# Patient Record
Sex: Female | Born: 1940
Health system: Southern US, Community
[De-identification: ages and names within clinical notes are randomized; demographics above are authoritative.]

## PROBLEM LIST (undated history)

## (undated) DIAGNOSIS — I351 Nonrheumatic aortic (valve) insufficiency: Secondary | ICD-10-CM

## (undated) DIAGNOSIS — I480 Paroxysmal atrial fibrillation: Secondary | ICD-10-CM

## (undated) DIAGNOSIS — J189 Pneumonia, unspecified organism: Secondary | ICD-10-CM

## (undated) DIAGNOSIS — I639 Cerebral infarction, unspecified: Secondary | ICD-10-CM

## (undated) DIAGNOSIS — C50919 Malignant neoplasm of unspecified site of unspecified female breast: Secondary | ICD-10-CM

## (undated) DIAGNOSIS — I519 Heart disease, unspecified: Secondary | ICD-10-CM

## (undated) DIAGNOSIS — D649 Anemia, unspecified: Secondary | ICD-10-CM

## (undated) DIAGNOSIS — F419 Anxiety disorder, unspecified: Secondary | ICD-10-CM

## (undated) DIAGNOSIS — K219 Gastro-esophageal reflux disease without esophagitis: Secondary | ICD-10-CM

## (undated) DIAGNOSIS — I509 Heart failure, unspecified: Secondary | ICD-10-CM

## (undated) DIAGNOSIS — I10 Essential (primary) hypertension: Secondary | ICD-10-CM

## (undated) DIAGNOSIS — H34212 Partial retinal artery occlusion, left eye: Secondary | ICD-10-CM

## (undated) DIAGNOSIS — F32A Depression, unspecified: Secondary | ICD-10-CM

## (undated) DIAGNOSIS — J449 Chronic obstructive pulmonary disease, unspecified: Secondary | ICD-10-CM

## (undated) DIAGNOSIS — F329 Major depressive disorder, single episode, unspecified: Secondary | ICD-10-CM

## (undated) DIAGNOSIS — I2510 Atherosclerotic heart disease of native coronary artery without angina pectoris: Secondary | ICD-10-CM

## (undated) DIAGNOSIS — I428 Other cardiomyopathies: Secondary | ICD-10-CM

## (undated) DIAGNOSIS — R0602 Shortness of breath: Secondary | ICD-10-CM

## (undated) DIAGNOSIS — E785 Hyperlipidemia, unspecified: Secondary | ICD-10-CM

## (undated) DIAGNOSIS — C50419 Malignant neoplasm of upper-outer quadrant of unspecified female breast: Secondary | ICD-10-CM

## (undated) DIAGNOSIS — K862 Cyst of pancreas: Secondary | ICD-10-CM

## (undated) HISTORY — PX: WISDOM TOOTH EXTRACTION: SHX21

## (undated) HISTORY — PX: DILATION AND CURETTAGE OF UTERUS: SHX78

## (undated) HISTORY — DX: Atherosclerotic heart disease of native coronary artery without angina pectoris: I25.10

## (undated) HISTORY — DX: Heart disease, unspecified: I51.9

## (undated) HISTORY — DX: Chronic obstructive pulmonary disease, unspecified: J44.9

## (undated) HISTORY — DX: Partial retinal artery occlusion, left eye: H34.212

## (undated) HISTORY — DX: Malignant neoplasm of unspecified site of unspecified female breast: C50.919

## (undated) HISTORY — DX: Cerebral infarction, unspecified: I63.9

## (undated) HISTORY — DX: Other cardiomyopathies: I42.8

## (undated) HISTORY — DX: Paroxysmal atrial fibrillation: I48.0

## (undated) HISTORY — DX: Heart failure, unspecified: I50.9

## (undated) HISTORY — PX: COLONOSCOPY: SHX174

## (undated) HISTORY — DX: Nonrheumatic aortic (valve) insufficiency: I35.1

## (undated) HISTORY — DX: Hyperlipidemia, unspecified: E78.5

## (undated) HISTORY — DX: Malignant neoplasm of upper-outer quadrant of unspecified female breast: C50.419

---
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
## 1969-02-20 HISTORY — PX: DILATION AND CURETTAGE OF UTERUS: SHX78

## 1972-06-22 HISTORY — PX: TUBAL LIGATION: SHX77

## 1972-06-22 HISTORY — PX: APPENDECTOMY: SHX54

## 1997-11-29 ENCOUNTER — Other Ambulatory Visit: Admission: RE | Admit: 1997-11-29 | Discharge: 1997-11-29 | Payer: Self-pay | Admitting: Obstetrics and Gynecology

## 1998-09-03 ENCOUNTER — Other Ambulatory Visit: Admission: RE | Admit: 1998-09-03 | Discharge: 1998-09-03 | Payer: Self-pay | Admitting: Radiology

## 1998-09-17 ENCOUNTER — Other Ambulatory Visit: Admission: RE | Admit: 1998-09-17 | Discharge: 1998-09-17 | Payer: Self-pay | Admitting: Obstetrics and Gynecology

## 2000-09-28 ENCOUNTER — Encounter: Admission: RE | Admit: 2000-09-28 | Discharge: 2000-09-28 | Payer: Self-pay | Admitting: Obstetrics and Gynecology

## 2000-09-28 ENCOUNTER — Encounter: Payer: Self-pay | Admitting: Obstetrics and Gynecology

## 2001-10-04 ENCOUNTER — Ambulatory Visit (HOSPITAL_COMMUNITY): Admission: RE | Admit: 2001-10-04 | Discharge: 2001-10-04 | Payer: Self-pay | Admitting: Gastroenterology

## 2001-10-04 ENCOUNTER — Encounter (INDEPENDENT_AMBULATORY_CARE_PROVIDER_SITE_OTHER): Payer: Self-pay | Admitting: Specialist

## 2001-12-19 ENCOUNTER — Other Ambulatory Visit: Admission: RE | Admit: 2001-12-19 | Discharge: 2001-12-19 | Payer: Self-pay | Admitting: Obstetrics and Gynecology

## 2002-11-15 ENCOUNTER — Other Ambulatory Visit: Admission: RE | Admit: 2002-11-15 | Discharge: 2002-11-15 | Payer: Self-pay | Admitting: Obstetrics and Gynecology

## 2003-12-12 ENCOUNTER — Other Ambulatory Visit: Admission: RE | Admit: 2003-12-12 | Discharge: 2003-12-12 | Payer: Self-pay | Admitting: Obstetrics and Gynecology

## 2005-08-12 ENCOUNTER — Encounter: Admission: RE | Admit: 2005-08-12 | Discharge: 2005-08-12 | Payer: Self-pay | Admitting: Obstetrics and Gynecology

## 2005-08-24 ENCOUNTER — Other Ambulatory Visit: Admission: RE | Admit: 2005-08-24 | Discharge: 2005-08-24 | Payer: Self-pay | Admitting: Obstetrics and Gynecology

## 2006-11-10 ENCOUNTER — Other Ambulatory Visit: Admission: RE | Admit: 2006-11-10 | Discharge: 2006-11-10 | Payer: Self-pay | Admitting: Obstetrics and Gynecology

## 2010-02-20 DIAGNOSIS — C50919 Malignant neoplasm of unspecified site of unspecified female breast: Secondary | ICD-10-CM

## 2010-02-20 HISTORY — DX: Malignant neoplasm of unspecified site of unspecified female breast: C50.919

## 2010-02-21 ENCOUNTER — Other Ambulatory Visit: Admission: RE | Admit: 2010-02-21 | Discharge: 2010-02-21 | Payer: Self-pay | Admitting: Gynecology

## 2010-02-21 ENCOUNTER — Ambulatory Visit: Payer: Self-pay | Admitting: Gynecology

## 2010-02-27 ENCOUNTER — Emergency Department (HOSPITAL_COMMUNITY): Admission: EM | Admit: 2010-02-27 | Discharge: 2010-02-27 | Payer: Self-pay | Admitting: Family Medicine

## 2010-02-27 DIAGNOSIS — C50411 Malignant neoplasm of upper-outer quadrant of right female breast: Secondary | ICD-10-CM | POA: Insufficient documentation

## 2010-02-27 DIAGNOSIS — C50419 Malignant neoplasm of upper-outer quadrant of unspecified female breast: Secondary | ICD-10-CM

## 2010-02-27 HISTORY — PX: BREAST BIOPSY: SHX20

## 2010-02-27 HISTORY — DX: Malignant neoplasm of upper-outer quadrant of unspecified female breast: C50.419

## 2010-03-04 ENCOUNTER — Ambulatory Visit: Payer: Self-pay | Admitting: Oncology

## 2010-03-05 ENCOUNTER — Emergency Department (HOSPITAL_COMMUNITY): Admission: EM | Admit: 2010-03-05 | Discharge: 2010-03-05 | Payer: Self-pay | Admitting: Family Medicine

## 2010-03-06 LAB — CBC WITH DIFFERENTIAL/PLATELET
Basophils Absolute: 0 10*3/uL (ref 0.0–0.1)
Eosinophils Absolute: 0.2 10*3/uL (ref 0.0–0.5)
HCT: 39.2 % (ref 34.8–46.6)
HGB: 13.4 g/dL (ref 11.6–15.9)
LYMPH%: 30.3 % (ref 14.0–49.7)
MCHC: 34.2 g/dL (ref 31.5–36.0)
MONO#: 0.4 10*3/uL (ref 0.1–0.9)
NEUT%: 58.2 % (ref 38.4–76.8)
Platelets: 286 10*3/uL (ref 145–400)
WBC: 5.4 10*3/uL (ref 3.9–10.3)
lymph#: 1.7 10*3/uL (ref 0.9–3.3)

## 2010-03-06 LAB — COMPREHENSIVE METABOLIC PANEL
AST: 18 U/L (ref 0–37)
Albumin: 4.4 g/dL (ref 3.5–5.2)
BUN: 14 mg/dL (ref 6–23)
CO2: 28 mEq/L (ref 19–32)
Calcium: 10.3 mg/dL (ref 8.4–10.5)
Chloride: 105 mEq/L (ref 96–112)
Creatinine, Ser: 1.28 mg/dL — ABNORMAL HIGH (ref 0.40–1.20)
Glucose, Bld: 108 mg/dL — ABNORMAL HIGH (ref 70–99)
Potassium: 4.6 mEq/L (ref 3.5–5.3)

## 2010-03-06 LAB — CANCER ANTIGEN 27.29: CA 27.29: 30 U/mL (ref 0–39)

## 2010-03-07 ENCOUNTER — Encounter: Admission: RE | Admit: 2010-03-07 | Discharge: 2010-03-07 | Payer: Self-pay

## 2010-03-13 ENCOUNTER — Ambulatory Visit (HOSPITAL_COMMUNITY): Admission: RE | Admit: 2010-03-13 | Discharge: 2010-03-13 | Payer: Self-pay | Admitting: Oncology

## 2010-04-11 ENCOUNTER — Ambulatory Visit: Payer: Self-pay | Admitting: Oncology

## 2010-04-17 ENCOUNTER — Ambulatory Visit: Payer: Self-pay | Admitting: Gynecology

## 2010-04-23 ENCOUNTER — Ambulatory Visit: Payer: Self-pay | Admitting: Obstetrics and Gynecology

## 2010-05-19 ENCOUNTER — Ambulatory Visit: Payer: Self-pay | Admitting: Obstetrics and Gynecology

## 2010-05-28 ENCOUNTER — Ambulatory Visit (HOSPITAL_BASED_OUTPATIENT_CLINIC_OR_DEPARTMENT_OTHER): Payer: Medicare Other | Admitting: Oncology

## 2010-05-30 LAB — CBC WITH DIFFERENTIAL/PLATELET
Basophils Absolute: 0 10*3/uL (ref 0.0–0.1)
EOS%: 4.2 % (ref 0.0–7.0)
Eosinophils Absolute: 0.2 10*3/uL (ref 0.0–0.5)
HGB: 13 g/dL (ref 11.6–15.9)
MCV: 92.9 fL (ref 79.5–101.0)
MONO%: 6.5 % (ref 0.0–14.0)
NEUT#: 3.3 10*3/uL (ref 1.5–6.5)
RBC: 4.01 10*6/uL (ref 3.70–5.45)
RDW: 13 % (ref 11.2–14.5)
lymph#: 1.5 10*3/uL (ref 0.9–3.3)

## 2010-05-30 LAB — COMPREHENSIVE METABOLIC PANEL
AST: 20 U/L (ref 0–37)
Albumin: 4 g/dL (ref 3.5–5.2)
Alkaline Phosphatase: 50 U/L (ref 39–117)
Calcium: 9.9 mg/dL (ref 8.4–10.5)
Chloride: 107 mEq/L (ref 96–112)
Glucose, Bld: 121 mg/dL — ABNORMAL HIGH (ref 70–99)
Potassium: 4 mEq/L (ref 3.5–5.3)
Sodium: 140 mEq/L (ref 135–145)
Total Protein: 6.8 g/dL (ref 6.0–8.3)

## 2010-06-19 LAB — BASIC METABOLIC PANEL
BUN: 13 mg/dL (ref 6–23)
Calcium: 9.6 mg/dL (ref 8.4–10.5)
Glucose, Bld: 122 mg/dL — ABNORMAL HIGH (ref 70–99)

## 2010-07-25 ENCOUNTER — Ambulatory Visit: Payer: No Typology Code available for payment source | Admitting: Oncology

## 2010-07-25 DIAGNOSIS — C50919 Malignant neoplasm of unspecified site of unspecified female breast: Secondary | ICD-10-CM

## 2010-07-25 LAB — CBC WITH DIFFERENTIAL/PLATELET
BASO%: 0.5 % (ref 0.0–2.0)
Eosinophils Absolute: 0.2 10*3/uL (ref 0.0–0.5)
HCT: 40.2 % (ref 34.8–46.6)
LYMPH%: 28.1 % (ref 14.0–49.7)
MCHC: 34.5 g/dL (ref 31.5–36.0)
MCV: 92.9 fL (ref 79.5–101.0)
MONO%: 7.6 % (ref 0.0–14.0)
NEUT%: 59.4 % (ref 38.4–76.8)
Platelets: 226 10*3/uL (ref 145–400)
RBC: 4.33 10*6/uL (ref 3.70–5.45)

## 2010-07-25 LAB — VITAMIN D 25 HYDROXY (VIT D DEFICIENCY, FRACTURES): Vit D, 25-Hydroxy: 38 ng/mL (ref 30–89)

## 2010-07-25 LAB — COMPREHENSIVE METABOLIC PANEL
Alkaline Phosphatase: 51 U/L (ref 39–117)
Creatinine, Ser: 1.06 mg/dL (ref 0.40–1.20)
Glucose, Bld: 133 mg/dL — ABNORMAL HIGH (ref 70–99)
Sodium: 138 mEq/L (ref 135–145)
Total Bilirubin: 0.4 mg/dL (ref 0.3–1.2)
Total Protein: 6.5 g/dL (ref 6.0–8.3)

## 2010-07-25 LAB — PROTIME-INR: Protime: 12 Seconds (ref 10.6–13.4)

## 2010-08-15 ENCOUNTER — Other Ambulatory Visit: Payer: Self-pay | Admitting: Dermatology

## 2010-09-04 LAB — POCT I-STAT, CHEM 8
Calcium, Ion: 1.2 mmol/L (ref 1.12–1.32)
HCT: 42 % (ref 36.0–46.0)
Hemoglobin: 14.3 g/dL (ref 12.0–15.0)
Sodium: 139 mEq/L (ref 135–145)
TCO2: 25 mmol/L (ref 0–100)

## 2010-09-04 LAB — GLUCOSE, CAPILLARY: Glucose-Capillary: 110 mg/dL — ABNORMAL HIGH (ref 70–99)

## 2010-09-10 ENCOUNTER — Encounter (HOSPITAL_BASED_OUTPATIENT_CLINIC_OR_DEPARTMENT_OTHER): Payer: Medicare Other | Admitting: Oncology

## 2010-09-10 ENCOUNTER — Other Ambulatory Visit: Payer: Self-pay | Admitting: Oncology

## 2010-09-10 DIAGNOSIS — C50919 Malignant neoplasm of unspecified site of unspecified female breast: Secondary | ICD-10-CM

## 2010-09-11 LAB — VITAMIN D 25 HYDROXY (VIT D DEFICIENCY, FRACTURES): Vit D, 25-Hydroxy: 52 ng/mL (ref 30–89)

## 2010-11-12 ENCOUNTER — Other Ambulatory Visit: Payer: Self-pay | Admitting: Oncology

## 2010-11-12 ENCOUNTER — Encounter (HOSPITAL_BASED_OUTPATIENT_CLINIC_OR_DEPARTMENT_OTHER): Payer: Medicare Other | Admitting: Oncology

## 2010-11-12 DIAGNOSIS — C50919 Malignant neoplasm of unspecified site of unspecified female breast: Secondary | ICD-10-CM

## 2010-11-12 DIAGNOSIS — M810 Age-related osteoporosis without current pathological fracture: Secondary | ICD-10-CM

## 2010-11-12 DIAGNOSIS — Z170 Estrogen receptor positive status [ER+]: Secondary | ICD-10-CM

## 2010-11-12 LAB — CBC WITH DIFFERENTIAL/PLATELET
BASO%: 0.5 % (ref 0.0–2.0)
EOS%: 3.8 % (ref 0.0–7.0)
MCH: 32.1 pg (ref 25.1–34.0)
MCHC: 34.5 g/dL (ref 31.5–36.0)
NEUT%: 61.2 % (ref 38.4–76.8)
RBC: 4.24 10*6/uL (ref 3.70–5.45)
RDW: 13.3 % (ref 11.2–14.5)
WBC: 4.7 10*3/uL (ref 3.9–10.3)
lymph#: 1.2 10*3/uL (ref 0.9–3.3)

## 2010-11-13 LAB — COMPREHENSIVE METABOLIC PANEL
ALT: 11 U/L (ref 0–35)
AST: 16 U/L (ref 0–37)
Albumin: 4.3 g/dL (ref 3.5–5.2)
Alkaline Phosphatase: 40 U/L (ref 39–117)
BUN: 16 mg/dL (ref 6–23)
Potassium: 4.1 mEq/L (ref 3.5–5.3)

## 2010-11-13 LAB — CANCER ANTIGEN 27.29: CA 27.29: 30 U/mL (ref 0–39)

## 2010-11-27 ENCOUNTER — Encounter (INDEPENDENT_AMBULATORY_CARE_PROVIDER_SITE_OTHER): Payer: Self-pay | Admitting: Surgery

## 2010-12-15 ENCOUNTER — Inpatient Hospital Stay: Admission: RE | Admit: 2010-12-15 | Payer: Medicare Other | Source: Ambulatory Visit

## 2011-01-09 ENCOUNTER — Other Ambulatory Visit: Payer: Self-pay | Admitting: Oncology

## 2011-01-09 ENCOUNTER — Encounter: Payer: Medicare Other | Admitting: Oncology

## 2011-01-09 DIAGNOSIS — C50911 Malignant neoplasm of unspecified site of right female breast: Secondary | ICD-10-CM

## 2011-01-15 ENCOUNTER — Ambulatory Visit
Admission: RE | Admit: 2011-01-15 | Discharge: 2011-01-15 | Disposition: A | Discharge status: Home / Self Care | Payer: Medicare Other | Source: Ambulatory Visit | Attending: Oncology | Admitting: Oncology

## 2011-01-15 DIAGNOSIS — C50911 Malignant neoplasm of unspecified site of right female breast: Secondary | ICD-10-CM

## 2011-01-15 MED ORDER — GADOBENATE DIMEGLUMINE 529 MG/ML IV SOLN
10.0000 mL | Freq: Once | INTRAVENOUS | Status: AC | PRN
  Administered 2011-01-15: 10 mL via INTRAVENOUS

## 2011-01-20 ENCOUNTER — Ambulatory Visit (INDEPENDENT_AMBULATORY_CARE_PROVIDER_SITE_OTHER): Payer: Medicare Other | Admitting: Surgery

## 2011-01-20 ENCOUNTER — Encounter (INDEPENDENT_AMBULATORY_CARE_PROVIDER_SITE_OTHER): Payer: Self-pay | Admitting: Surgery

## 2011-01-20 VITALS — BP 120/82 | HR 75 | Temp 98.8°F | Ht 64.0 in | Wt 121.0 lb

## 2011-01-20 DIAGNOSIS — C50419 Malignant neoplasm of upper-outer quadrant of unspecified female breast: Secondary | ICD-10-CM

## 2011-01-20 NOTE — Patient Instructions (Signed)
I will see you back in December or early January and we will plan to do a guide wire localized excision of your right breast cancer

## 2011-01-20 NOTE — Progress Notes (Signed)
CC: Breast cancer followup was neoadjuvant chemotherapy HPI: This patient was initially seen in September, 2011 with a right breast mass in the upper part of her breast. She had an invasive ductal carcinoma which was a clinical stage II, receptor positive, HER-2 negative. She has been on neoadjuvant for marrow since then. She thinks ear he has gotten somewhat smaller and softer.  On her initial physical examination there was a fairly large 3 cm mass that was hard but no dimpling and not appear fixed deep. I saw her in followup again in January and it seems softer perhaps 2-2.5 cm. I saw her again in April and again the mass appeared to be smaller and perhaps only about 2 cm. She now comes back for followup.  ROS: No significant changes have occurred since her initial ROS recorded in her chart  MEDS: Current Outpatient Prescriptions on File Prior to Visit  Medication Sig Dispense Refill  . Calcium Carbonate-Vitamin D (CALCIUM 500 + D) 500-125 MG-UNIT TABS Take by mouth.        . Cholecalciferol (VITAMIN D-3) 5000 UNITS TABS Take by mouth.        . DiphenhydrAMINE HCl, Sleep, (SIMPLY SLEEP PO) Take 2 tablets by mouth Nightly.        . Letrozole (FEMARA PO) Take 1 tablet by mouth daily.        . Omega-3 Fatty Acids (FISH OIL PO) Take by mouth.        . sertraline (ZOLOFT) 100 MG tablet Take 100 mg by mouth daily.           ALLERGIES:No Known Allergies    PE GENERAL: The patient is alert, oriented, and generally healthy-appearing, NAD. Mood and affect are normal.  HEENT: The head is normocephalic, the eyes nonicteric, the pupils were round regular and equal. EOMs are normal. Pharynx normal. Dentition good.  NECK: The neck is supple and there are no masses or thyromegaly.  LUNGS: Normal respirations and clear to auscultation.  HEART: Regular rhythm, with no murmurs rubs or gallops. Pulses are intact carotid dorsalis pedis and posterior tibial. No significant varicosities are  noted.  BREASTS: The breasts are symmetric in appearance. There is a soft masslike affect in about the 12:00 position of the right breast. It is certainly less discrete than it was on initial exam. There are no other abnormalities of the right or left breast. LYMPHATICS no axillary or supraclavicular adenopathy is apparent today  ABDOMEN: Soft, flat, and nontender. No masses or organomegaly is noted. No hernias are noted. Bowel sounds are normal.  EXTREMITIES: Good range of motion, no edema.   Data Reviewed Her recent MRI report is noted. The area in question has shrunk but still about 3 cm. Ultrasound shows continues shrinkage.  Assessment Stage II right breast cancer 12:00 position status post neoadjuvant chemotherapy Plan I think we can do a lumpectomy adequately, but there may be additional shrinkage if we wait. She would like to have surgery in January, and  I think that will be fine. We will see her in December or early January

## 2011-02-04 ENCOUNTER — Encounter (INDEPENDENT_AMBULATORY_CARE_PROVIDER_SITE_OTHER): Payer: Self-pay | Admitting: Oncology

## 2011-03-19 ENCOUNTER — Other Ambulatory Visit: Payer: Self-pay | Admitting: Oncology

## 2011-03-19 ENCOUNTER — Encounter (HOSPITAL_BASED_OUTPATIENT_CLINIC_OR_DEPARTMENT_OTHER): Payer: Medicare Other | Admitting: Oncology

## 2011-03-19 DIAGNOSIS — M810 Age-related osteoporosis without current pathological fracture: Secondary | ICD-10-CM

## 2011-03-19 DIAGNOSIS — C50919 Malignant neoplasm of unspecified site of unspecified female breast: Secondary | ICD-10-CM

## 2011-03-19 DIAGNOSIS — Z170 Estrogen receptor positive status [ER+]: Secondary | ICD-10-CM

## 2011-03-19 LAB — CBC WITH DIFFERENTIAL/PLATELET
Basophils Absolute: 0 10*3/uL (ref 0.0–0.1)
EOS%: 3.1 % (ref 0.0–7.0)
HCT: 36.5 % (ref 34.8–46.6)
HGB: 13 g/dL (ref 11.6–15.9)
LYMPH%: 28.8 % (ref 14.0–49.7)
MCH: 32.9 pg (ref 25.1–34.0)
MCV: 92.7 fL (ref 79.5–101.0)
MONO%: 7.1 % (ref 0.0–14.0)
NEUT%: 60.6 % (ref 38.4–76.8)

## 2011-03-20 LAB — COMPREHENSIVE METABOLIC PANEL
AST: 20 U/L (ref 0–37)
Alkaline Phosphatase: 43 U/L (ref 39–117)
BUN: 19 mg/dL (ref 6–23)
Calcium: 10.3 mg/dL (ref 8.4–10.5)
Creatinine, Ser: 1.19 mg/dL — ABNORMAL HIGH (ref 0.50–1.10)

## 2011-05-20 ENCOUNTER — Ambulatory Visit (HOSPITAL_BASED_OUTPATIENT_CLINIC_OR_DEPARTMENT_OTHER): Payer: Medicare Other | Admitting: Oncology

## 2011-05-20 ENCOUNTER — Telehealth: Payer: Self-pay | Admitting: Oncology

## 2011-05-20 VITALS — BP 116/75 | HR 73 | Temp 97.9°F | Ht 63.5 in | Wt 123.5 lb

## 2011-05-20 DIAGNOSIS — C50919 Malignant neoplasm of unspecified site of unspecified female breast: Secondary | ICD-10-CM

## 2011-05-20 DIAGNOSIS — C50419 Malignant neoplasm of upper-outer quadrant of unspecified female breast: Secondary | ICD-10-CM

## 2011-05-20 DIAGNOSIS — Z170 Estrogen receptor positive status [ER+]: Secondary | ICD-10-CM

## 2011-05-20 NOTE — Telephone Encounter (Signed)
called pt lmovm for appt on 05/26/2011 and 08/20/2011.  asked pt to rtn call to confirm appt

## 2011-05-20 NOTE — Progress Notes (Signed)
Hematology and Oncology Follow Up Visit  Dana Pruitt [id number] Jul 23, 1940 70 y.o. 05/20/2011 6:35 PM   Principle Diagnosis: 70 year old woman with history of locally advanced ER PR positive breast cancer on neoadjuvant Femara therapy dating back to September 2011  Interim History:  She is doing well she tolerates Femara well. The plan is for her to undergo surgery in January. He currently had a percent of her right breast , results of which we do not have. Clinically it would appear that she has responded well to treatment.  Medications: I have reviewed the patient's current medications.  Allergies: No Known Allergies  Past Medical History, Surgical history, Social history, and Family History were reviewed and updated.  Review of Systems: Constitutional:  Negative for fever, chills, night sweats, anorexia, weight loss, pain. Cardiovascular: no chest pain or dyspnea on exertion Respiratory: no cough, shortness of breath, or wheezing Neurological: no TIA or stroke symptoms Dermatological: negative ENT: negative Skin Gastrointestinal: no abdominal pain, change in bowel habits, or black or bloody stools Genito-Urinary: no dysuria, trouble voiding, or hematuria Hematological and Lymphatic: negative Breast: negative for breast lumps Musculoskeletal: negative Remaining ROS negative.  Physical Exam: Blood pressure 116/75, pulse 73, temperature 97.9 F (36.6 C), height 5' 3.5" (1.613 m), weight 123 lb 8 oz (56.019 kg). ECOG:  General appearance: alert, cooperative and appears stated age Head: Normocephalic, without obvious abnormality, atraumatic Neck: no adenopathy, no carotid bruit, no JVD, supple, symmetrical, trachea midline and thyroid not enlarged, symmetric, no tenderness/mass/nodules Lymph nodes: Cervical, supraclavicular, and axillary nodes normal. Cardiac : Normal heart sounds Pulmonary: Normal breath sounds Breasts: Fibrocystic changes in both breasts without discrete  mass in the right breast. Abdomen: Nontender, no organomegaly or masses Extremities normal no clubbing or cyanosis Neuro: Normal  Lab Results: Lab Results  Component Value Date   WBC 4.9 03/19/2011   HGB 13.0 03/19/2011   HCT 36.5 03/19/2011   MCV 92.7 03/19/2011   PLT 244 03/19/2011     Chemistry      Component Value Date/Time   NA 140 03/19/2011 1359   NA 140 03/19/2011 1359   K 4.1 03/19/2011 1359   K 4.1 03/19/2011 1359   CL 104 03/19/2011 1359   CL 104 03/19/2011 1359   CO2 25 03/19/2011 1359   CO2 25 03/19/2011 1359   BUN 19 03/19/2011 1359   BUN 19 03/19/2011 1359   CREATININE 1.19* 03/19/2011 1359   CREATININE 1.19* 03/19/2011 1359      Component Value Date/Time   CALCIUM 10.3 03/19/2011 1359   CALCIUM 10.3 03/19/2011 1359   ALKPHOS 43 03/19/2011 1359   ALKPHOS 43 03/19/2011 1359   AST 20 03/19/2011 1359   AST 20 03/19/2011 1359   ALT 15 03/19/2011 1359   ALT 15 03/19/2011 1359   BILITOT 0.5 03/19/2011 1359   BILITOT 0.5 03/19/2011 1359       Radiological Studies: chest X-ray n/a Mammogram n/a Bone density n/a  Impression and Plan: Pleasant postmenopausal women on neoadjuvant hormonal therapy for a year now due to undergo surgery. This will likely be a lumpectomy. I will see her again in followup after she has undergone radiation. She should stay on the Femara throughout her course until she is seen again.  More than 50% of the visit was spent in patient-related counselling   [name], MD 11/28/20126:35 PM

## 2011-05-22 ENCOUNTER — Encounter (INDEPENDENT_AMBULATORY_CARE_PROVIDER_SITE_OTHER): Payer: Self-pay | Admitting: Surgery

## 2011-05-22 ENCOUNTER — Ambulatory Visit (INDEPENDENT_AMBULATORY_CARE_PROVIDER_SITE_OTHER): Payer: Medicare Other | Admitting: Surgery

## 2011-05-22 ENCOUNTER — Other Ambulatory Visit: Payer: Self-pay | Admitting: Nurse Practitioner

## 2011-05-22 VITALS — BP 130/84 | HR 68 | Temp 97.8°F | Resp 16 | Ht 64.0 in | Wt 123.0 lb

## 2011-05-22 DIAGNOSIS — C50419 Malignant neoplasm of upper-outer quadrant of unspecified female breast: Secondary | ICD-10-CM

## 2011-05-22 NOTE — Progress Notes (Signed)
CC: Breast cancer followup was neoadjuvant chemotherapy HPI: This patient was initially seen in September, 2011 with a right breast mass in the upper part of her breast. She had an invasive ductal carcinoma which was a clinical stage II, receptor positive, HER-2 negative. She has been on neoadjuvant for marrow since then. She thinks ear he has gotten somewhat smaller and softer.  On her initial physical examination there was a fairly large 3 cm mass that was hard but no dimpling and not appear fixed deep. I saw her in followup again in January and it seems softer perhaps 2-2.5 cm. I saw her again in April and again the mass appeared to be smaller and perhaps only about 2 cm. She now comes back for followup.  ROS: No significant changes have occurred since her initial ROS recorded in her chart  MEDS: Current Outpatient Prescriptions on File Prior to Visit  Medication Sig Dispense Refill  . acyclovir (ZOVIRAX) 200 MG capsule Ad lib.      . Calcium Carbonate-Vitamin D (CALCIUM 500 + D) 500-125 MG-UNIT TABS Take by mouth.        . Cholecalciferol (VITAMIN D-3) 5000 UNITS TABS Take by mouth.        . DiphenhydrAMINE HCl, Sleep, (SIMPLY SLEEP PO) Take 2 tablets by mouth Nightly.       . Letrozole (FEMARA PO) Take 1 tablet by mouth daily.        . Omega-3 Fatty Acids (FISH OIL PO) Take by mouth.        . sertraline (ZOLOFT) 100 MG tablet Take 100 mg by mouth daily.           ALLERGIES:No Known Allergies    PE GENERAL: The patient is alert, oriented, and generally healthy-appearing, NAD. Mood and affect are normal.    BREASTS: The breasts are symmetric in appearance. There is a soft masslike affect in about the 12:00 position of the right breast. It is certainly less discrete than it was on initial exam. There are no other abnormalities of the right or left breast. LYMPHATICS no axillary or supraclavicular adenopathy is apparent today    Data Reviewed Ultrasoound report  pending  Assessment Stage II right breast cancer 12:00 position status post neoadjuvant hormonal therapy Plan I think we can do a lumpectomy adequately, but there may be additional shrinkage if we wait. She would like to have surgery in January, and  I think that will be fine. I have discussed the indications for the lumpectomy and described the procedure. She understand that the chance of removal of the abnormal area is very good, but that occasionally we are unable to locate it and may have to do a second procedure. We also discussed the possibility of a second procedure to get additional tissue. Risks of surgery such as bleeding and infection have also been explained, as well as the implications of not doing the surgery. She understands and wishes to proceed. i will see her one more time before surgery but will try to get the surgery arranged today

## 2011-05-22 NOTE — Patient Instructions (Signed)
We will schedule you for surgery in early January. I will need to see you a week or two before surgery to review everything again

## 2011-05-26 ENCOUNTER — Ambulatory Visit (HOSPITAL_BASED_OUTPATIENT_CLINIC_OR_DEPARTMENT_OTHER): Payer: Medicare Other

## 2011-05-26 DIAGNOSIS — M810 Age-related osteoporosis without current pathological fracture: Secondary | ICD-10-CM

## 2011-05-26 DIAGNOSIS — C50919 Malignant neoplasm of unspecified site of unspecified female breast: Secondary | ICD-10-CM

## 2011-05-26 DIAGNOSIS — C50419 Malignant neoplasm of upper-outer quadrant of unspecified female breast: Secondary | ICD-10-CM

## 2011-05-26 MED ORDER — DENOSUMAB 60 MG/ML ~~LOC~~ SOLN
60.0000 mg | Freq: Once | SUBCUTANEOUS | Status: AC
  Administered 2011-05-26: 60 mg via SUBCUTANEOUS
  Filled 2011-05-26: qty 1

## 2011-05-28 ENCOUNTER — Telehealth: Payer: Self-pay | Admitting: *Deleted

## 2011-05-28 NOTE — Telephone Encounter (Signed)
Dana Pruitt from [facility] is calling.  Pt. Is there for Bone Density, however there is no order.  Reviewed Dr. Renelda Loma last 2 notes and no mention is made. Pt's last one was at [facility] (Dr. Eda Paschal)  On 04/17/10.  Pt. Has also had one at [facility], but it has been several years ago.    When and Where does Dr. Donnie Coffin want next bone density?  Please let pt. Know  [phone] and schedule.   Let them know that Dr. Donnie Coffin is out of the office until Monday 06/01/11.    Will review with him then.  Let pt. Know to call us on Tuesday 06/02/11 if she has not heard from [place].

## 2011-05-29 ENCOUNTER — Other Ambulatory Visit (INDEPENDENT_AMBULATORY_CARE_PROVIDER_SITE_OTHER): Payer: Self-pay | Admitting: Surgery

## 2011-05-29 DIAGNOSIS — C50911 Malignant neoplasm of unspecified site of right female breast: Secondary | ICD-10-CM

## 2011-06-30 ENCOUNTER — Ambulatory Visit (INDEPENDENT_AMBULATORY_CARE_PROVIDER_SITE_OTHER): Payer: Medicare Other | Admitting: Surgery

## 2011-06-30 ENCOUNTER — Encounter (INDEPENDENT_AMBULATORY_CARE_PROVIDER_SITE_OTHER): Payer: Self-pay | Admitting: Surgery

## 2011-06-30 VITALS — BP 146/88 | HR 70 | Temp 97.9°F | Resp 16 | Ht 64.0 in | Wt 121.2 lb

## 2011-06-30 DIAGNOSIS — C50419 Malignant neoplasm of upper-outer quadrant of unspecified female breast: Secondary | ICD-10-CM

## 2011-06-30 NOTE — Progress Notes (Signed)
See H&P note from today

## 2011-06-30 NOTE — H&P (Signed)
  CC:  Breast cancer followup was neoadjuvant chemotherapy  HPI:  This patient was initially seen in September, 2011 with a right breast mass in the upper part of her breast. She had an invasive ductal carcinoma which was a clinical stage II, receptor positive, HER-2 negative. She has been on neoadjuvant hormonal txsince then. She thinks the area has gotten somewhat smaller and softer.  On her initial physical examination there was a fairly large 3 cm mass that was hard but no dimpling and not appear fixed deep. I saw her in followup again in January and it seems softer perhaps 2-2.5 cm. I saw her again in April and again the mass appeared to be smaller and perhaps only about 2 cm. In November it was not definitel palpable. ROS:  No significant changes have occurred since her initial ROS recorded in her chart  MEDS:  Current Outpatient Prescriptions on File Prior to Visit   Medication  Sig  Dispense  Refill   .  acyclovir (ZOVIRAX) 200 MG capsule  Ad lib.     .  Calcium Carbonate-Vitamin D (CALCIUM 500 + D) 500-125 MG-UNIT TABS  Take by mouth.     .  Cholecalciferol (VITAMIN D-3) 5000 UNITS TABS  Take by mouth.     .  DiphenhydrAMINE HCl, Sleep, (SIMPLY SLEEP PO)  Take 2 tablets by mouth Nightly.     .  Letrozole (FEMARA PO)  Take 1 tablet by mouth daily.     .  Omega-3 Fatty Acids (FISH OIL PO)  Take by mouth.     .  sertraline (ZOLOFT) 100 MG tablet  Take 100 mg by mouth daily.     ALLERGIES:No Known Allergies  PE  GENERAL: The patient is alert, oriented, and generally healthy-appearing, NAD. Mood and affect are normal.  HEENT: Normal CHEST: Clear, normal respirations HEART; Reg, no M.R,of G. BREASTS: The breasts are symmetric in appearance. There is no definite mass today There are no other abnormalities of the right or left breast.  LYMPHATICS no axillary or supraclavicular adenopathy is apparent today  Data Reviewed  Ultrasound: mass now more retracted looking and 1.9 cm  max Assessment  Stage II right breast cancer 12:00 position status post neoadjuvant hormonal therapy  Plan: NL Lumpectomy and SLN.\ I have discussed the indications for the lumpectomy and described the procedure. She understand that the chance of removal of the abnormal area is very good, but that occasionally we are unable to locate it and may have to do a second procedure. We also discussed the possibility of a second procedure to get additional tissue. Risks of surgery such as bleeding and infection have also been explained, as well as the implications of not doing the surgery. She understands and wishes to proceed. She is scheduled for Friday   

## 2011-07-01 ENCOUNTER — Encounter (HOSPITAL_BASED_OUTPATIENT_CLINIC_OR_DEPARTMENT_OTHER): Payer: Self-pay | Admitting: *Deleted

## 2011-07-01 NOTE — Progress Notes (Signed)
No labs needed

## 2011-07-02 ENCOUNTER — Encounter (HOSPITAL_BASED_OUTPATIENT_CLINIC_OR_DEPARTMENT_OTHER): Payer: Self-pay | Admitting: *Deleted

## 2011-07-03 ENCOUNTER — Ambulatory Visit (HOSPITAL_COMMUNITY)
Admission: RE | Admit: 2011-07-03 | Discharge: 2011-07-03 | Disposition: A | Discharge status: Home / Self Care | Payer: Medicare Other | Source: Ambulatory Visit | Attending: Surgery | Admitting: Surgery

## 2011-07-03 ENCOUNTER — Ambulatory Visit (HOSPITAL_BASED_OUTPATIENT_CLINIC_OR_DEPARTMENT_OTHER)
Admission: RE | Admit: 2011-07-03 | Discharge: 2011-07-03 | Disposition: A | Discharge status: Home / Self Care | Payer: Medicare Other | Source: Ambulatory Visit | Attending: Surgery | Admitting: Surgery

## 2011-07-03 ENCOUNTER — Encounter (HOSPITAL_BASED_OUTPATIENT_CLINIC_OR_DEPARTMENT_OTHER)
Admission: RE | Disposition: A | Discharge status: Home / Self Care | Payer: Self-pay | Source: Ambulatory Visit | Attending: Surgery

## 2011-07-03 ENCOUNTER — Other Ambulatory Visit (INDEPENDENT_AMBULATORY_CARE_PROVIDER_SITE_OTHER): Payer: Self-pay | Admitting: Surgery

## 2011-07-03 ENCOUNTER — Encounter (HOSPITAL_BASED_OUTPATIENT_CLINIC_OR_DEPARTMENT_OTHER): Payer: Self-pay | Admitting: *Deleted

## 2011-07-03 ENCOUNTER — Encounter (HOSPITAL_BASED_OUTPATIENT_CLINIC_OR_DEPARTMENT_OTHER): Payer: Self-pay | Admitting: Anesthesiology

## 2011-07-03 ENCOUNTER — Ambulatory Visit (HOSPITAL_BASED_OUTPATIENT_CLINIC_OR_DEPARTMENT_OTHER): Payer: Medicare Other | Admitting: Anesthesiology

## 2011-07-03 DIAGNOSIS — D0590 Unspecified type of carcinoma in situ of unspecified breast: Secondary | ICD-10-CM | POA: Insufficient documentation

## 2011-07-03 DIAGNOSIS — C50911 Malignant neoplasm of unspecified site of right female breast: Secondary | ICD-10-CM

## 2011-07-03 DIAGNOSIS — C50419 Malignant neoplasm of upper-outer quadrant of unspecified female breast: Secondary | ICD-10-CM | POA: Insufficient documentation

## 2011-07-03 HISTORY — PX: BREAST LUMPECTOMY WITH SENTINEL LYMPH NODE BIOPSY: SHX5597

## 2011-07-03 HISTORY — DX: Depression, unspecified: F32.A

## 2011-07-03 SURGERY — BREAST LUMPECTOMY WITH SENTINEL LYMPH NODE BX
Anesthesia: General | Site: Breast | Laterality: Right | Wound class: Clean

## 2011-07-03 MED ORDER — FENTANYL CITRATE 0.05 MG/ML IJ SOLN
50.0000 ug | INTRAMUSCULAR | Status: DC | PRN
  Administered 2011-07-03: 50 ug via INTRAVENOUS

## 2011-07-03 MED ORDER — MIDAZOLAM HCL 2 MG/2ML IJ SOLN
0.5000 mg | INTRAMUSCULAR | Status: DC | PRN
  Administered 2011-07-03: 1 mg via INTRAVENOUS

## 2011-07-03 MED ORDER — BUPIVACAINE HCL (PF) 0.25 % IJ SOLN
INTRAMUSCULAR | Status: DC | PRN
  Administered 2011-07-03: 30 mL

## 2011-07-03 MED ORDER — LACTATED RINGERS IV SOLN
INTRAVENOUS | Status: DC
Start: 2011-07-03 — End: 2011-07-03
  Administered 2011-07-03 (×2): via INTRAVENOUS

## 2011-07-03 MED ORDER — MORPHINE SULFATE 2 MG/ML IJ SOLN
0.0500 mg/kg | INTRAMUSCULAR | Status: DC | PRN
  Administered 2011-07-03 (×2): 1 mg via INTRAVENOUS

## 2011-07-03 MED ORDER — FENTANYL CITRATE 0.05 MG/ML IJ SOLN
INTRAMUSCULAR | Status: DC | PRN
  Administered 2011-07-03 (×2): 50 ug via INTRAVENOUS

## 2011-07-03 MED ORDER — LIDOCAINE HCL (CARDIAC) 20 MG/ML IV SOLN
INTRAVENOUS | Status: DC | PRN
  Administered 2011-07-03: 50 mg via INTRAVENOUS

## 2011-07-03 MED ORDER — OXYCODONE HCL 5 MG PO TABS
5.0000 mg | ORAL_TABLET | Freq: Four times a day (QID) | ORAL | Status: DC | PRN
  Administered 2011-07-03: 5 mg via ORAL

## 2011-07-03 MED ORDER — PROPOFOL 10 MG/ML IV EMUL
INTRAVENOUS | Status: DC | PRN
  Administered 2011-07-03: 200 mg via INTRAVENOUS

## 2011-07-03 MED ORDER — SODIUM CHLORIDE 0.9 % IJ SOLN
INTRAMUSCULAR | Status: DC | PRN
  Administered 2011-07-03: 14:00:00 via INTRAMUSCULAR

## 2011-07-03 MED ORDER — DEXAMETHASONE SODIUM PHOSPHATE 4 MG/ML IJ SOLN
INTRAMUSCULAR | Status: DC | PRN
  Administered 2011-07-03: 10 mg via INTRAVENOUS

## 2011-07-03 MED ORDER — METOCLOPRAMIDE HCL 5 MG/ML IJ SOLN: 10.0000 mg | Freq: Once | INTRAMUSCULAR | Status: DC | PRN

## 2011-07-03 MED ORDER — TECHNETIUM TC 99M SULFUR COLLOID FILTERED
1.0000 | Freq: Once | INTRAVENOUS | Status: AC | PRN
  Administered 2011-07-03: 1 via INTRADERMAL

## 2011-07-03 MED ORDER — MIDAZOLAM HCL 5 MG/5ML IJ SOLN
INTRAMUSCULAR | Status: DC | PRN
  Administered 2011-07-03: 1 mg via INTRAVENOUS

## 2011-07-03 MED ORDER — ONDANSETRON HCL 4 MG/2ML IJ SOLN
INTRAMUSCULAR | Status: DC | PRN
  Administered 2011-07-03: 4 mg via INTRAVENOUS

## 2011-07-03 MED ORDER — ACETAMINOPHEN 10 MG/ML IV SOLN
1000.0000 mg | Freq: Once | INTRAVENOUS | Status: AC
  Administered 2011-07-03: 1000 mg via INTRAVENOUS

## 2011-07-03 MED ORDER — METOCLOPRAMIDE HCL 5 MG/ML IJ SOLN
INTRAMUSCULAR | Status: DC | PRN
  Administered 2011-07-03: 10 mg via INTRAVENOUS

## 2011-07-03 MED ORDER — CEFAZOLIN SODIUM 1-5 GM-% IV SOLN
1.0000 g | INTRAVENOUS | Status: AC
  Administered 2011-07-03: 1 g via INTRAVENOUS

## 2011-07-03 MED ORDER — FENTANYL CITRATE 0.05 MG/ML IJ SOLN
25.0000 ug | INTRAMUSCULAR | Status: DC | PRN
  Administered 2011-07-03: 50 ug via INTRAVENOUS
  Administered 2011-07-03 (×2): 25 ug via INTRAVENOUS

## 2011-07-03 MED ORDER — OXYCODONE HCL 5 MG PO TABS: 5.0000 mg | ORAL_TABLET | Freq: Four times a day (QID) | ORAL | Status: AC | PRN

## 2011-07-03 SURGICAL SUPPLY — 62 items
ADH SKN CLS APL DERMABOND .7 (GAUZE/BANDAGES/DRESSINGS) ×1
APPLIER CLIP 11 MED OPEN (CLIP)
APPLIER CLIP 9.375 MED OPEN (MISCELLANEOUS)
APR CLP MED 11 20 MLT OPN (CLIP)
APR CLP MED 9.3 20 MLT OPN (MISCELLANEOUS)
BLADE HEX COATED 2.75 (ELECTRODE) ×2 IMPLANT
BLADE SURG 15 STRL LF DISP TIS (BLADE) ×2 IMPLANT
BLADE SURG 15 STRL SS (BLADE) ×4
CANISTER SUCTION 1200CC (MISCELLANEOUS) ×2 IMPLANT
CHLORAPREP W/TINT 26ML (MISCELLANEOUS) ×2 IMPLANT
CLIP APPLIE 11 MED OPEN (CLIP) IMPLANT
CLIP APPLIE 9.375 MED OPEN (MISCELLANEOUS) IMPLANT
CLIP TI MEDIUM 6 (CLIP) IMPLANT
CLIP TI WIDE RED SMALL 6 (CLIP) ×2 IMPLANT
CLOTH BEACON ORANGE TIMEOUT ST (SAFETY) ×2 IMPLANT
COVER KIT LFREE 14X147CM (MISCELLANEOUS) IMPLANT
COVER MAYO STAND STRL (DRAPES) ×2 IMPLANT
COVER PROBE W GEL 5X96 (DRAPES) ×2 IMPLANT
COVER TABLE BACK 60X90 (DRAPES) ×2 IMPLANT
DECANTER SPIKE VIAL GLASS SM (MISCELLANEOUS) IMPLANT
DERMABOND ADVANCED (GAUZE/BANDAGES/DRESSINGS) ×1
DERMABOND ADVANCED .7 DNX12 (GAUZE/BANDAGES/DRESSINGS) ×2 IMPLANT
DEVICE DUBIN W/COMP PLATE 8390 (MISCELLANEOUS) ×1 IMPLANT
DRAIN CHANNEL 19F RND (DRAIN) IMPLANT
DRAPE LAPAROSCOPIC ABDOMINAL (DRAPES) ×2 IMPLANT
DRAPE UTILITY XL STRL (DRAPES) ×2 IMPLANT
ELECT REM PT RETURN 9FT ADLT (ELECTROSURGICAL) ×2
ELECTRODE REM PT RTRN 9FT ADLT (ELECTROSURGICAL) ×1 IMPLANT
EVACUATOR SILICONE 100CC (DRAIN) IMPLANT
GLOVE BIO SURGEON STRL SZ 6.5 (GLOVE) ×1 IMPLANT
GLOVE BIOGEL PI IND STRL 7.0 (GLOVE) IMPLANT
GLOVE BIOGEL PI INDICATOR 7.0 (GLOVE) ×1
GLOVE EUDERMIC 7 POWDERFREE (GLOVE) ×2 IMPLANT
GLOVE SKINSENSE NS SZ7.0 (GLOVE) ×1
GLOVE SKINSENSE STRL SZ7.0 (GLOVE) IMPLANT
GOWN PREVENTION PLUS XLARGE (GOWN DISPOSABLE) ×5 IMPLANT
KIT MARKER MARGIN INK (KITS) ×2 IMPLANT
NDL HYPO 25X1 1.5 SAFETY (NEEDLE) ×2 IMPLANT
NDL SAFETY ECLIPSE 18X1.5 (NEEDLE) ×1 IMPLANT
NEEDLE HYPO 18GX1.5 SHARP (NEEDLE) ×2
NEEDLE HYPO 25X1 1.5 SAFETY (NEEDLE) ×4 IMPLANT
NS IRRIG 1000ML POUR BTL (IV SOLUTION) ×2 IMPLANT
PACK BASIN DAY SURGERY FS (CUSTOM PROCEDURE TRAY) ×2 IMPLANT
PENCIL BUTTON HOLSTER BLD 10FT (ELECTRODE) ×2 IMPLANT
PIN SAFETY STERILE (MISCELLANEOUS) IMPLANT
SLEEVE SCD COMPRESS KNEE MED (MISCELLANEOUS) ×2 IMPLANT
SPONGE GAUZE 4X4 12PLY (GAUZE/BANDAGES/DRESSINGS) IMPLANT
SPONGE INTESTINAL PEANUT (DISPOSABLE) IMPLANT
SPONGE LAP 18X18 X RAY DECT (DISPOSABLE) IMPLANT
SPONGE LAP 4X18 X RAY DECT (DISPOSABLE) ×2 IMPLANT
STAPLER VISISTAT 35W (STAPLE) ×1 IMPLANT
SUT ETHILON 2 0 FS 18 (SUTURE) IMPLANT
SUT ETHILON 3 0 FSL (SUTURE) IMPLANT
SUT MNCRL AB 4-0 PS2 18 (SUTURE) ×4 IMPLANT
SUT VIC AB 4-0 BRD 54 (SUTURE) IMPLANT
SUT VICRYL 3-0 CR8 SH (SUTURE) ×4 IMPLANT
SYR CONTROL 10ML LL (SYRINGE) ×4 IMPLANT
TOWEL OR 17X24 6PK STRL BLUE (TOWEL DISPOSABLE) ×2 IMPLANT
TOWEL OR NON WOVEN STRL DISP B (DISPOSABLE) ×2 IMPLANT
TUBE CONNECTING 20X1/4 (TUBING) ×2 IMPLANT
WATER STERILE IRR 1000ML POUR (IV SOLUTION) ×1 IMPLANT
YANKAUER SUCT BULB TIP NO VENT (SUCTIONS) ×2 IMPLANT

## 2011-07-03 NOTE — Anesthesia Preprocedure Evaluation (Signed)
Anesthesia Evaluation  Patient identified by MRN, date of birth, ID band Patient awake    Reviewed: Allergy & Precautions, H&P , NPO status , Patient's Chart, lab work & pertinent test results, reviewed documented beta blocker date and time   Airway Mallampati: II TM Distance: >3 FB Neck ROM: full    Dental   Pulmonary neg pulmonary ROS,          Cardiovascular neg cardio ROS     Neuro/Psych PSYCHIATRIC DISORDERS Negative Neurological ROS     GI/Hepatic negative GI ROS, Neg liver ROS,   Endo/Other  Negative Endocrine ROS  Renal/GU negative Renal ROS  Genitourinary negative   Musculoskeletal   Abdominal   Peds  Hematology negative hematology ROS (+)   Anesthesia Other Findings See surgeon's H&P   Reproductive/Obstetrics negative OB ROS                           Anesthesia Physical Anesthesia Plan  ASA: II  Anesthesia Plan: General   Post-op Pain Management:    Induction: Intravenous  Airway Management Planned: LMA  Additional Equipment:   Intra-op Plan:   Post-operative Plan: Extubation in OR  Informed Consent: I have reviewed the patients History and Physical, chart, labs and discussed the procedure including the risks, benefits and alternatives for the proposed anesthesia with the patient or authorized representative who has indicated his/her understanding and acceptance.     Plan Discussed with: CRNA and Surgeon  Anesthesia Plan Comments:         Anesthesia Quick Evaluation  

## 2011-07-03 NOTE — Interval H&P Note (Signed)
History and Physical Interval Note:  07/03/2011 1:39 PM  Dana Pruitt  has presented today for surgery, with the diagnosis of right breast cancer  The various methods of treatment have been discussed with the patient and family. After consideration of risks, benefits and other options for treatment, the patient has consented to  Procedure(s): BREAST LUMPECTOMY WITH SENTINEL LYMPH NODE BX as a surgical intervention .  The patients' history has been reviewed, patient examined, no change in status, stable for surgery.  I have reviewed the patients' chart and labs.  Questions were answered to the patient's satisfaction.     Gizel Riedlinger J

## 2011-07-03 NOTE — Progress Notes (Signed)
Emotional support to patient during breast injections 

## 2011-07-03 NOTE — Anesthesia Procedure Notes (Signed)
Procedure Name: LMA Insertion Date/Time: 07/03/2011 1:59 PM Performed by: Zenia Resides D Pre-anesthesia Checklist: Patient identified, Emergency Drugs available, Suction available and Patient being monitored Patient Re-evaluated:Patient Re-evaluated prior to inductionOxygen Delivery Method: Circle System Utilized Preoxygenation: Pre-oxygenation with 100% oxygen Intubation Type: IV induction Ventilation: Mask ventilation without difficulty LMA: LMA inserted LMA Size: 3.0 Number of attempts: 1 Airway Equipment and Method: bite block Placement Confirmation: positive ETCO2 and breath sounds checked- equal and bilateral Tube secured with: Tape Dental Injury: Teeth and Oropharynx as per pre-operative assessment

## 2011-07-03 NOTE — H&P (View-Only) (Signed)
  CC:  Breast cancer followup was neoadjuvant chemotherapy  HPI:  This patient was initially seen in September, 2011 with a right breast mass in the upper part of her breast. She had an invasive ductal carcinoma which was a clinical stage II, receptor positive, HER-2 negative. She has been on neoadjuvant hormonal txsince then. She thinks the area has gotten somewhat smaller and softer.  On her initial physical examination there was a fairly large 3 cm mass that was hard but no dimpling and not appear fixed deep. I saw her in followup again in January and it seems softer perhaps 2-2.5 cm. I saw her again in April and again the mass appeared to be smaller and perhaps only about 2 cm. In November it was not definitel palpable. ROS:  No significant changes have occurred since her initial ROS recorded in her chart  MEDS:  Current Outpatient Prescriptions on File Prior to Visit   Medication  Sig  Dispense  Refill   .  acyclovir (ZOVIRAX) 200 MG capsule  Ad lib.     .  Calcium Carbonate-Vitamin D (CALCIUM 500 + D) 500-125 MG-UNIT TABS  Take by mouth.     .  Cholecalciferol (VITAMIN D-3) 5000 UNITS TABS  Take by mouth.     .  DiphenhydrAMINE HCl, Sleep, (SIMPLY SLEEP PO)  Take 2 tablets by mouth Nightly.     .  Letrozole (FEMARA PO)  Take 1 tablet by mouth daily.     .  Omega-3 Fatty Acids (FISH OIL PO)  Take by mouth.     .  sertraline (ZOLOFT) 100 MG tablet  Take 100 mg by mouth daily.     ALLERGIES:No Known Allergies  PE  GENERAL: The patient is alert, oriented, and generally healthy-appearing, NAD. Mood and affect are normal.  HEENT: Normal CHEST: Clear, normal respirations HEART; Reg, no M.R,of G. BREASTS: The breasts are symmetric in appearance. There is no definite mass today There are no other abnormalities of the right or left breast.  LYMPHATICS no axillary or supraclavicular adenopathy is apparent today  Data Reviewed  Ultrasound: mass now more retracted looking and 1.9 cm  max Assessment  Stage II right breast cancer 12:00 position status post neoadjuvant hormonal therapy  Plan: NL Lumpectomy and SLN.\ I have discussed the indications for the lumpectomy and described the procedure. She understand that the chance of removal of the abnormal area is very good, but that occasionally we are unable to locate it and may have to do a second procedure. We also discussed the possibility of a second procedure to get additional tissue. Risks of surgery such as bleeding and infection have also been explained, as well as the implications of not doing the surgery. She understands and wishes to proceed. She is scheduled for Friday

## 2011-07-03 NOTE — Op Note (Signed)
Dana Pruitt  12/27/1940  [id number]  07/03/2011   Preoperative diagnosis: Right breast cancer upper outer quadrant, status post neoadjuvant chemotherapy  Postoperative diagnosis: Same  Procedure: Right needle localized partial mastectomy with blue dye injection and right axillary sentinel lymph node dissection  Surgeon: Currie Paris, MD, FACS  Anesthesia: General  Clinical History and Indications: This patient presented several months ago with a breast cancer in the upper outer quadrant of the right breast. She has undergone a lengthy program of neoadjuvant hormonal therapy with dramatic reduction in the size of the tumor. It was elected at this point to recommend lumpectomy and sentinel node evaluation. She presents today for that surgery.  Description of Procedure:I saw the patient in the preoperative period and reviewed the plans for surgery with her. I marked the right breast the opposite side. I reviewed the localizing films. I answered all questions.  The patient was taken to the operating room and after satisfactory general anesthesia had been obtained a time out was done. I then injected 5 cc of dilute methylene blue subareolar and massage that end. A full prep and drape was then done.  I made a curvilinear incision starting at the guidewire entry site and going laterally as that was direction the needle tract. Are seen very thin skin flaps superiorly and inferiorly. I then divided the breast tissue down to the chest wall needle to the entry site of the guidewire. I divided the breast tissue down to the chest wall superior lower and inferior and freed up the breast off of the underlying fascia taking the fascia with the specimen.I was able to manipulate the entire area of breast tissue into the wound and divided the final lateral attachments.  The specimen was inked for margin assessment a specimen mammogram done showing what appeared to be the clip and the tumor in the  specimen. By palpation there was really no discrete dominant mass in the tissue and all the surrounding tissue at the margins appear to be dense breast tissue.. I elevated the breast tissue superior and inferior off of the chest also that I could close the defect. I irrigated and made sure it was dry. I put clips and to mark the margins and irrigated and checked for hemostasis a second time.20 cc of 0.25% plain Marcaine was infiltrated prior to beginning to close I then closed the breast in layers with 3-0 Vicryl and then 4-0 Monocryl subcuticular and Dermabond.  I then used the neoprobe to identify a hot area in the right axilla. I made a transverse incision and divide the subcutaneous tissue until I identified the axillary fat pad. I immediately saw a large blue lymphatic and traced this into a lower dark blue lymph node that had 3 little lymphatic vessels entering into it. I grasped that with a Babcock and elevated up and began to take it out. I saw a second tiny lymph node just deep to the first one and included that and there is a firm lymph node adjacent to the first one that also was included.The largest of the lymph nodes had a count of about 2500, the second one about 800, and the third one had no counts and no blue dye but was just firm.  I put Marcaine and help with postop pain relief and then closed in layers with 3-0 Vicryl 4-0 Monocryl subcuticular and Dermabond.    The patient tolerated the procedure well. There were no operative complications. All counts were correct.  EBL: Minimal  Currie Paris, MD, FACS 07/03/2011 3:11 PM

## 2011-07-03 NOTE — Anesthesia Postprocedure Evaluation (Signed)
Anesthesia Post Note  Patient: Dana Pruitt  Procedure(s) Performed:  BREAST LUMPECTOMY WITH SENTINEL LYMPH NODE BX - right needle localization lumpectomy & sentinal node  Anesthesia type: General  Patient location: PACU  Post pain: Pain level controlled  Post assessment: Patient's Cardiovascular Status Stable  Last Vitals:  Filed Vitals:   07/03/11 1615  BP: 139/84  Pulse: 79  Temp:   Resp: 18    Post vital signs: Reviewed and stable  Level of consciousness: alert  Complications: No apparent anesthesia complications

## 2011-07-03 NOTE — Transfer of Care (Signed)
Immediate Anesthesia Transfer of Care Note  Patient: Dana Pruitt  Procedure(s) Performed:  BREAST LUMPECTOMY WITH SENTINEL LYMPH NODE BX - right needle localization lumpectomy & sentinal node  Patient Location: PACU  Anesthesia Type: General  Level of Consciousness: awake, alert  and oriented  Airway & Oxygen Therapy: Patient Spontanous Breathing and Patient connected to face mask oxygen  Post-op Assessment: Report given to PACU RN and Post -op Vital signs reviewed and stable  Post vital signs: Reviewed and stable Filed Vitals:   07/03/11 1320  BP:   Pulse: 77  Temp:   Resp: 16    Complications: No apparent anesthesia complications

## 2011-07-07 ENCOUNTER — Encounter (INDEPENDENT_AMBULATORY_CARE_PROVIDER_SITE_OTHER): Payer: Self-pay | Admitting: Surgery

## 2011-07-07 ENCOUNTER — Telehealth (INDEPENDENT_AMBULATORY_CARE_PROVIDER_SITE_OTHER): Payer: Self-pay | Admitting: General Surgery

## 2011-07-07 NOTE — Telephone Encounter (Signed)
Message copied by Liliana Cline on Tue Jul 07, 2011  4:24 PM ------      Message from: Currie Paris      Created: Tue Jul 07, 2011  2:57 PM       Tell the patient that her margins are OK and her lymph nodes are negative. I will discuss in detail in the office.

## 2011-07-07 NOTE — Telephone Encounter (Signed)
Patient aware path results are good. Lymph nodes negative and margins ok. She will follow up in the office at her scheduled appt and call with any questions prior.  

## 2011-07-08 ENCOUNTER — Other Ambulatory Visit: Payer: Self-pay | Admitting: *Deleted

## 2011-07-08 DIAGNOSIS — C50919 Malignant neoplasm of unspecified site of unspecified female breast: Secondary | ICD-10-CM

## 2011-07-10 ENCOUNTER — Other Ambulatory Visit: Payer: Self-pay | Admitting: Obstetrics and Gynecology

## 2011-07-10 ENCOUNTER — Encounter: Payer: Self-pay | Admitting: Obstetrics and Gynecology

## 2011-07-10 DIAGNOSIS — C50919 Malignant neoplasm of unspecified site of unspecified female breast: Secondary | ICD-10-CM

## 2011-07-16 ENCOUNTER — Telehealth: Payer: Self-pay | Admitting: *Deleted

## 2011-07-16 NOTE — Telephone Encounter (Signed)
Received call from pt stating she needed appt with Radiation oncologist.  Per Dr. Donnie Coffin note he will f/u after surgery and radiation.  Spoke to radiation scheduler and gave referral.  Pt will see Dr. Michell Heinrich on 07/22/11.

## 2011-07-21 ENCOUNTER — Encounter: Payer: Self-pay | Admitting: *Deleted

## 2011-07-21 ENCOUNTER — Encounter (INDEPENDENT_AMBULATORY_CARE_PROVIDER_SITE_OTHER): Payer: Self-pay | Admitting: Surgery

## 2011-07-21 ENCOUNTER — Ambulatory Visit (INDEPENDENT_AMBULATORY_CARE_PROVIDER_SITE_OTHER): Payer: Medicare Other | Admitting: Surgery

## 2011-07-21 VITALS — BP 138/74 | HR 70 | Temp 97.9°F | Resp 18 | Ht 64.75 in | Wt 120.0 lb

## 2011-07-21 DIAGNOSIS — C801 Malignant (primary) neoplasm, unspecified: Secondary | ICD-10-CM | POA: Insufficient documentation

## 2011-07-21 DIAGNOSIS — Z9889 Other specified postprocedural states: Secondary | ICD-10-CM

## 2011-07-21 DIAGNOSIS — C50419 Malignant neoplasm of upper-outer quadrant of unspecified female breast: Secondary | ICD-10-CM

## 2011-07-21 NOTE — Progress Notes (Signed)
Dana Pruitt    [id number] 07/21/2011    [birth year]   CC: Post op lumpectomy and sln  HPI: The patient returns for post op follow-up. She underwent a right lumpectomay with sln  on 07/03/11. Over all she feels that she is doing well. She is pleased with the cosmetic effect. She has a radiation therapy appointment tomorrow  PE: The incision is healing nicely and there is no evidence of infection or hematoma.    DATA REVIEWED: Pathology report showed IDC, 2.3 cm, Neg margin, 0/3 SLN +  IMPRESSION: Patient doing well.   PLAN: Her next visit will be in about three months when she completes radiation.

## 2011-07-21 NOTE — Patient Instructions (Signed)
See me again a few weeks after completing radiation

## 2011-07-22 ENCOUNTER — Ambulatory Visit
Admission: RE | Admit: 2011-07-22 | Discharge: 2011-07-22 | Disposition: A | Discharge status: Home / Self Care | Payer: Medicare Other | Source: Ambulatory Visit | Attending: Radiation Oncology | Admitting: Radiation Oncology

## 2011-07-22 ENCOUNTER — Encounter: Payer: Self-pay | Admitting: Radiation Oncology

## 2011-07-22 DIAGNOSIS — Z170 Estrogen receptor positive status [ER+]: Secondary | ICD-10-CM | POA: Insufficient documentation

## 2011-07-22 DIAGNOSIS — L589 Radiodermatitis, unspecified: Secondary | ICD-10-CM | POA: Insufficient documentation

## 2011-07-22 DIAGNOSIS — C50419 Malignant neoplasm of upper-outer quadrant of unspecified female breast: Secondary | ICD-10-CM

## 2011-07-22 DIAGNOSIS — C50919 Malignant neoplasm of unspecified site of unspecified female breast: Secondary | ICD-10-CM | POA: Insufficient documentation

## 2011-07-22 DIAGNOSIS — Y842 Radiological procedure and radiotherapy as the cause of abnormal reaction of the patient, or of later complication, without mention of misadventure at the time of the procedure: Secondary | ICD-10-CM | POA: Insufficient documentation

## 2011-07-22 DIAGNOSIS — Z87891 Personal history of nicotine dependence: Secondary | ICD-10-CM | POA: Insufficient documentation

## 2011-07-22 DIAGNOSIS — Z510 Encounter for antineoplastic radiation therapy: Secondary | ICD-10-CM | POA: Insufficient documentation

## 2011-07-22 HISTORY — DX: Anxiety disorder, unspecified: F41.9

## 2011-07-22 NOTE — Progress Notes (Signed)
Complete NUTRITION RISK SCREEN worksheet without concerns submitted to Barbara Neff, RD. Also, complete PATIENT MEASURE OF DISTRESS worksheet with a score of 2 turned into social work.  

## 2011-07-22 NOTE — Progress Notes (Signed)
DIAGNOSIS:  T2 N0 invasive ductal carcinoma of the right breast.  PREVIOUS INTERVENTIONS: 1. Neoadjuvant Femara. 2. Right breast lumpectomy and sentinel lymph node biopsy on     07/03/2011 revealing a 2.3 cm invasive ductal carcinoma with 0/3     lymph nodes positive, ER/PR positive, HER2 negative.  HISTORY OF PRESENT ILLNESS:  Dana Pruitt is a pleasant 71 year old female who was diagnosed with a right breast cancer in September 2011.  A mammogram and ultrasound in early September showed a mass measuring 2.5 x 2.8 x 2.0 cm and a biopsy was performed on 02/27/2010 which showed an intermediate grade, ER/PR positive invasive ductal carcinoma.  She discussed her options with Dr. Jamey Ripa and Dr. Donnie Coffin and chose neoadjuvant Femara.  She was on that for a little over a year and then was referred to Dr. Jamey Ripa and had her definitive surgery on 07/03/2011. Clinically, the mass seemed to be getting smaller.  On the lumpectomy specimen, it measured 2.3 cm and had negative margins with 0/3 lymph nodes positive.  She was then sent for followup with me for consideration of adjuvant radiation.  She has recovered well from her surgery.  She has very minimal postoperative pain.  She has some soreness today because she was doing some scrubbing, but other than that she is doing well.  PAST MEDICAL HISTORY:  None.  MEDICATIONS:  Acyclovir p.r.n., calcium, vitamin D3, Benadryl p.r.n., Femara, fish oil, and Zoloft.  ALLERGIES:  She has no known drug allergies.  FAMILY HISTORY:  There is no family history of malignancy.  SOCIAL HISTORY:  She is retired and lives in [place] with her husband.  She has 2 children.  She did smoke a pack a day for 25 years but quit in 1992.  She admits to social alcohol consumption.  She denies any illicit drug use.  REVIEW OF SYSTEMS:  As per the History Present of Illness.  All other systems were reviewed and found to be negative.  PHYSICAL EXAMINATION:  General:   She is a pleasant female in no distress sitting comfortably on the exam room table.  She appears her stated age. Vital Signs:  Her weight is 120 pounds.  Height 5 feet 4 inches.  Blood pressure 146/78, pulse 81, temperature 97.7.  Lymphatic:  She has no palpable cervical or supraclavicular adenopathy.  Breasts:  She has a healing incision on the upper outer quadrant of the right breast. Healing incision underneath the right arm.  There are no palpable or visible abnormalities of the left breast.  Heart:  Regular rate and rhythm.  Lungs:  Clear to auscultation bilaterally.  IMPRESSION:  A 71 year old female with a T2 N0 right breast cancer status post neoadjuvant Femara.  RECOMMENDATIONS:  Dana Pruitt and I talked at length about the size of her tumor and the likelihood that it had a Swiss cheese type appearance on pathology and reason that it felt like he was getting smaller and yet measured about the same size as it was on her initial MRI scan.  I drew pictures of this for her and explained what negative margins were with her.  We discussed the pros and cons of radiation.  She would like to proceed forward to maximize her local control.  I think this is reasonable.  We discussed using the [place] fractionation as she does have a small breast size.  We discussed the equivalency in terms of local control in cosmesis.  We discussed 16 treatments followed by a boost.  I  have scheduled her for simulation next Tuesday with plans to start the following week.  She will let me know if she has any questions.  She signed informed consent and agreed to proceed forward. We discussed process of simulation and the placement of tattoos.  We discussed fatigue and skin redness as well as permanent lung damage as a side effect.    ______________________________ Lurline Hare, M.D. SW/MEDQ  D:  07/22/2011  T:  07/22/2011  Job:  [id number]

## 2011-07-22 NOTE — Progress Notes (Signed)
70 year white female. Retired. Former smoker. Presents to the clinic today for an initial consultation with Dr. Michell Heinrich. Patient is alert and oriented to person, place, and time. No distress noted. Steady gait noted. Pleasant affect noted. Patient denies pain at this time but, does report that her breast is sore related to the effects of surgery. Patient denies nausea, vomiting, diarrhea, or headache. Patient denies significant weight loss. Patient reports that she saw Dr.Streck yesterday. Per patient Dr. Jamey Ripa reports she is healing well. PATIENT REPORTS TAKING FEMARA DAILY SINCE PRESCRIBED MEDICATION BY DR. Donnie Coffin A LITTLE MORE THAN A YEAR AGO.  Reported all findings to Dr. Michell Heinrich.  NKDA No history of radiation therapy  Denies having a pacemaker.

## 2011-07-22 NOTE — Progress Notes (Signed)
Please see the Nurse Progress Note in the MD Initial Consult Encounter for this patient. 

## 2011-07-23 ENCOUNTER — Other Ambulatory Visit: Payer: Self-pay

## 2011-07-23 DIAGNOSIS — C50919 Malignant neoplasm of unspecified site of unspecified female breast: Secondary | ICD-10-CM

## 2011-07-23 MED ORDER — LETROZOLE 2.5 MG PO TABS: 2.5000 mg | ORAL_TABLET | Freq: Every day | ORAL | Status: DC

## 2011-07-28 ENCOUNTER — Ambulatory Visit
Admission: RE | Admit: 2011-07-28 | Discharge: 2011-07-28 | Disposition: A | Discharge status: Home / Self Care | Payer: Medicare Other | Source: Ambulatory Visit | Attending: Radiation Oncology | Admitting: Radiation Oncology

## 2011-07-28 DIAGNOSIS — C50419 Malignant neoplasm of upper-outer quadrant of unspecified female breast: Secondary | ICD-10-CM

## 2011-07-28 NOTE — Progress Notes (Signed)
Met with patient to discuss RO billing.  Rad Tx: [id number]  Attending Rad: Dr. Michell Heinrich   Dx: 174.4 Upper-outer quadrant of breast  MOO Coverage  Efc 04/22/2009 to Current. [employer] benefits will cover all Medicare A & B Coin  and Deductible bal's.

## 2011-07-28 NOTE — Progress Notes (Signed)
Name: Dana Pruitt   [id number]  Date:  07/28/2011  DOB: 11-14-1940  Status:outpatient    DIAGNOSIS: Breast cancer.  CONSENT VERIFIED: yes   SET UP: Patient is setup supine   IMMOBILIZATION:  The following immobilization was used:Custom Moldable Pillow, breast board.   NARRATIVE: [name] was brought to the CT Simulation planning suite.  Identity was confirmed.  All relevant records and images related to the planned course of therapy were reviewed.  Then, the patient was positioned in a stable reproducible clinical set-up for radiation therapy.  Wires were placed to delineate the clinical extent of breast tissue. A wire was placed on the scar as well.  CT images were obtained.  An isocenter was placed. Skin markings were placed.  The CT images were loaded into the planning software where the target and avoidance structures were contoured.  The radiation prescription was entered and confirmed. The patient was discharged in stable condition and tolerated simulation well. She had questions regarding her Femara and after consultation with Dr. Donnie Coffin, I think it will be fine for her to continue taking this.    TREATMENT PLANNING NOTE:  Treatment planning then occurred. I have requested : MLC's, isodose plan, basic dose calculation

## 2011-07-29 ENCOUNTER — Ambulatory Visit: Payer: Medicare Other

## 2011-07-29 ENCOUNTER — Ambulatory Visit: Admission: RE | Admit: 2011-07-29 | Payer: Medicare Other | Source: Ambulatory Visit | Admitting: Radiation Oncology

## 2011-08-04 ENCOUNTER — Ambulatory Visit
Admission: RE | Admit: 2011-08-04 | Discharge: 2011-08-04 | Disposition: A | Discharge status: Home / Self Care | Payer: Medicare Other | Source: Ambulatory Visit | Attending: Radiation Oncology | Admitting: Radiation Oncology

## 2011-08-05 ENCOUNTER — Ambulatory Visit
Admission: RE | Admit: 2011-08-05 | Discharge: 2011-08-05 | Disposition: A | Discharge status: Home / Self Care | Payer: Medicare Other | Source: Ambulatory Visit | Attending: Radiation Oncology | Admitting: Radiation Oncology

## 2011-08-06 ENCOUNTER — Ambulatory Visit
Admission: RE | Admit: 2011-08-06 | Discharge: 2011-08-06 | Disposition: A | Discharge status: Home / Self Care | Payer: Medicare Other | Source: Ambulatory Visit | Attending: Radiation Oncology | Admitting: Radiation Oncology

## 2011-08-07 ENCOUNTER — Ambulatory Visit
Admission: RE | Admit: 2011-08-07 | Discharge: 2011-08-07 | Disposition: A | Discharge status: Home / Self Care | Payer: Medicare Other | Source: Ambulatory Visit | Attending: Radiation Oncology | Admitting: Radiation Oncology

## 2011-08-07 DIAGNOSIS — C50419 Malignant neoplasm of upper-outer quadrant of unspecified female breast: Secondary | ICD-10-CM | POA: Insufficient documentation

## 2011-08-07 DIAGNOSIS — C801 Malignant (primary) neoplasm, unspecified: Secondary | ICD-10-CM

## 2011-08-07 MED ORDER — RADIAPLEXRX EX GEL: Freq: Once | CUTANEOUS | Status: DC

## 2011-08-07 MED ORDER — RADIAPLEXRX EX GEL
Freq: Once | CUTANEOUS | Status: AC
  Administered 2011-08-07: 1 via TOPICAL

## 2011-08-07 MED ORDER — ALRA NON-METALLIC DEODORANT (RAD-ONC): 1.0000 "application " | Freq: Once | TOPICAL | Status: DC

## 2011-08-07 MED ORDER — ALRA NON-METALLIC DEODORANT (RAD-ONC)
1.0000 "application " | Freq: Once | TOPICAL | Status: AC
  Administered 2011-08-07: 1 via TOPICAL

## 2011-08-07 NOTE — Progress Notes (Signed)
POST SIM ED DONE WITH PATIENT, VERBALIZES UNDERSTANDING.  BOOKLET RADIATION AND YOU GIVEN TO PT WITH PERTINENT INFO MARKED.  RADIAPLEX GIVEN ALONG WITH ALRA DEODORANT WITH INSTRUCTIONS FOR USE.

## 2011-08-07 NOTE — Progress Notes (Signed)
NO C/O , 3RD TX

## 2011-08-10 ENCOUNTER — Ambulatory Visit
Admission: RE | Admit: 2011-08-10 | Discharge: 2011-08-10 | Disposition: A | Discharge status: Home / Self Care | Payer: Medicare Other | Source: Ambulatory Visit | Attending: Radiation Oncology | Admitting: Radiation Oncology

## 2011-08-11 ENCOUNTER — Ambulatory Visit
Admission: RE | Admit: 2011-08-11 | Discharge: 2011-08-11 | Disposition: A | Discharge status: Home / Self Care | Payer: Medicare Other | Source: Ambulatory Visit | Attending: Radiation Oncology | Admitting: Radiation Oncology

## 2011-08-11 VITALS — Wt 124.7 lb

## 2011-08-11 DIAGNOSIS — C50419 Malignant neoplasm of upper-outer quadrant of unspecified female breast: Secondary | ICD-10-CM

## 2011-08-11 NOTE — Progress Notes (Signed)
NO C/O , WT IS UP 5 LBS FROM LAST WEEK BUT I BELIEVE IT IS THE SCALES SINCE WE HAVE HAD TROUBLE WITH THEM

## 2011-08-11 NOTE — Progress Notes (Signed)
Weekly Management Note Current Dose:   Gy  Projected Dose:  Gy   Narrative:  The patient presents for routine under treatment assessment.  CBCT/MVCT images/Port film x-rays were reviewed.  The chart was checked. No complaints.   Physical Findings: Weight: 124 lb 11.2 oz (56.564 kg). Unchanged  Impression:  The patient is tolerating radiation.  Plan:  Continue treatment as planned. Continue radiaplex.

## 2011-08-12 ENCOUNTER — Encounter: Payer: Self-pay | Admitting: Radiation Oncology

## 2011-08-12 ENCOUNTER — Ambulatory Visit
Admission: RE | Admit: 2011-08-12 | Discharge: 2011-08-12 | Disposition: A | Discharge status: Home / Self Care | Payer: Medicare Other | Source: Ambulatory Visit | Attending: Radiation Oncology | Admitting: Radiation Oncology

## 2011-08-12 NOTE — Progress Notes (Signed)
Name: Dana Pruitt   MRN: [id number]  Date:  08/12/2011   DOB: 05-01-1941  Status:outpatient    DIAGNOSIS: Breast cancer.  CONSENT VERIFIED: yes   SET UP: Patient is setup supine   IMMOBILIZATION:  The following immobilization was used:Custom Moldable Pillow, breast board.   NARRATIVE: Dana Pruitt underwent complex simulation and treatment planning for her boost treatment today.  Her tumor volume was outlined on the planning CT scan. The depth of her cavity was 2.5 Cm.    12  MeV electrons will be prescribed to the 95% Isodose line.   A block will be used for beam modification purposes.  A special port plan is requested.

## 2011-08-13 ENCOUNTER — Ambulatory Visit
Admission: RE | Admit: 2011-08-13 | Discharge: 2011-08-13 | Disposition: A | Discharge status: Home / Self Care | Payer: Medicare Other | Source: Ambulatory Visit | Attending: Radiation Oncology | Admitting: Radiation Oncology

## 2011-08-13 NOTE — Progress Notes (Signed)
[facility] Radiation Oncology Weekly Treatment Note    Name: Dana Pruitt Date: 08/13/2011 MRN: [id number] DOB: [birth year]  Status: outpatient    Current dose: 801  Current fraction:3  Planned dose:4272  Planned fraction:16   MEDICATIONS: Current Outpatient Prescriptions  Medication Sig Dispense Refill  . acyclovir (ZOVIRAX) 200 MG capsule Ad lib.      . Calcium Carbonate-Vitamin D (CALCIUM 500 + D) 500-125 MG-UNIT TABS Take by mouth.        . Cholecalciferol (VITAMIN D-3) 5000 UNITS TABS Take by mouth.        . DiphenhydrAMINE HCl, Sleep, (SIMPLY SLEEP PO) Take 2 tablets by mouth Nightly.       [name] letrozole (FEMARA) 2.5 MG tablet Take 1 tablet (2.5 mg total) by mouth daily.  30 tablet  0  . Omega-3 Fatty Acids (FISH OIL PO) Take by mouth.        . sertraline (ZOLOFT) 100 MG tablet Take 100 mg by mouth daily.           ALLERGIES: Review of patient's allergies indicates no known allergies.   LABORATORY DATA:  Lab Results  Component Value Date   WBC 4.9 03/19/2011   HGB 14.5 07/03/2011   HCT 36.5 03/19/2011   MCV 92.7 03/19/2011   PLT 244 03/19/2011   Lab Results  Component Value Date   NA 140 03/19/2011   NA 140 03/19/2011   K 4.1 03/19/2011   K 4.1 03/19/2011   CL 104 03/19/2011   CL 104 03/19/2011   CO2 25 03/19/2011   CO2 25 03/19/2011   Lab Results  Component Value Date   ALT 15 03/19/2011   ALT 15 03/19/2011   AST 20 03/19/2011   AST 20 03/19/2011   ALKPHOS 43 03/19/2011   ALKPHOS 43 03/19/2011   BILITOT 0.5 03/19/2011   BILITOT 0.5 03/19/2011      NARRATIVE: Dana Pruitt was seen today for weekly treatment management. The chart was checked and port films images were reviewed. Pt doing well in 1st week of xrt. No complaints.  PHYSICAL EXAMINATION: vitals were not taken for this visit.       ASSESSMENT: Patient tolerating treatments well.    PLAN: Continue treatment as planned.

## 2011-08-14 ENCOUNTER — Telehealth: Payer: Self-pay | Admitting: Oncology

## 2011-08-14 ENCOUNTER — Ambulatory Visit
Admission: RE | Admit: 2011-08-14 | Discharge: 2011-08-14 | Disposition: A | Discharge status: Home / Self Care | Payer: Medicare Other | Source: Ambulatory Visit | Attending: Radiation Oncology | Admitting: Radiation Oncology

## 2011-08-14 NOTE — Telephone Encounter (Signed)
lmonvm adviisng the pt of her appt d/t change on 08/20/2011

## 2011-08-17 ENCOUNTER — Ambulatory Visit
Admission: RE | Admit: 2011-08-17 | Discharge: 2011-08-17 | Disposition: A | Discharge status: Home / Self Care | Payer: Medicare Other | Source: Ambulatory Visit | Attending: Radiation Oncology | Admitting: Radiation Oncology

## 2011-08-18 ENCOUNTER — Ambulatory Visit
Admission: RE | Admit: 2011-08-18 | Discharge: 2011-08-18 | Disposition: A | Discharge status: Home / Self Care | Payer: Medicare Other | Source: Ambulatory Visit | Attending: Radiation Oncology | Admitting: Radiation Oncology

## 2011-08-18 DIAGNOSIS — C50419 Malignant neoplasm of upper-outer quadrant of unspecified female breast: Secondary | ICD-10-CM

## 2011-08-18 NOTE — Progress Notes (Signed)
Weekly Management Note Current Dose: 26.7  Gy  Projected Dose:  52.72 Gy   Narrative:  The patient presents for routine under treatment assessment.  CBCT/MVCT images/Port film x-rays were reviewed.  The chart was checked. Doing well. No complaints.   Physical Findings: skin slightly red.   Impression:  The patient is tolerating radiation.  Plan:  Continue treatment as planned.continue radiaplex.

## 2011-08-19 ENCOUNTER — Ambulatory Visit
Admission: RE | Admit: 2011-08-19 | Discharge: 2011-08-19 | Disposition: A | Discharge status: Home / Self Care | Payer: Medicare Other | Source: Ambulatory Visit | Attending: Radiation Oncology | Admitting: Radiation Oncology

## 2011-08-19 ENCOUNTER — Encounter: Payer: Self-pay | Admitting: Radiation Oncology

## 2011-08-19 NOTE — Progress Notes (Signed)
Encounter addended by: Ardell Isaacs, RN on: 08/19/2011  4:44 PM<BR>     Documentation filed: Chief Complaint Section, Notes Section, Vitals Section

## 2011-08-19 NOTE — Progress Notes (Signed)
Patient presented to the clinic today for under treat visit with Dr. Michell Heinrich. Patient is alert and oriented to person, place, and time. No distress noted. Steady gait noted. Pleasant affect noted. Patient denies pain. Patient has no complaints. Hyperpigmentation of treated breast noted. Patient using Radiaplex as directed. Patient denies nausea, vomiting, diarrhea, headache, or dizziness. Patient reports eating and sleeping without difficulty. Reported all findings to Dr. Michell Heinrich.

## 2011-08-20 ENCOUNTER — Other Ambulatory Visit: Payer: Medicare Other | Admitting: Lab

## 2011-08-20 ENCOUNTER — Ambulatory Visit (HOSPITAL_BASED_OUTPATIENT_CLINIC_OR_DEPARTMENT_OTHER): Payer: Medicare Other | Admitting: Physician Assistant

## 2011-08-20 ENCOUNTER — Ambulatory Visit
Admission: RE | Admit: 2011-08-20 | Discharge: 2011-08-20 | Disposition: A | Discharge status: Home / Self Care | Payer: Medicare Other | Source: Ambulatory Visit | Attending: Radiation Oncology | Admitting: Radiation Oncology

## 2011-08-20 ENCOUNTER — Ambulatory Visit: Payer: Medicare Other | Admitting: Oncology

## 2011-08-20 VITALS — BP 133/83 | HR 81 | Temp 97.9°F | Ht 64.0 in | Wt 121.2 lb

## 2011-08-20 DIAGNOSIS — C50419 Malignant neoplasm of upper-outer quadrant of unspecified female breast: Secondary | ICD-10-CM

## 2011-08-20 LAB — COMPREHENSIVE METABOLIC PANEL
ALT: 13 U/L (ref 0–35)
AST: 15 U/L (ref 0–37)
Alkaline Phosphatase: 36 U/L — ABNORMAL LOW (ref 39–117)
CO2: 27 mEq/L (ref 19–32)
Creatinine, Ser: 1.15 mg/dL — ABNORMAL HIGH (ref 0.50–1.10)
Total Bilirubin: 0.5 mg/dL (ref 0.3–1.2)

## 2011-08-20 LAB — CBC WITH DIFFERENTIAL/PLATELET
BASO%: 0.2 % (ref 0.0–2.0)
EOS%: 2.9 % (ref 0.0–7.0)
HCT: 40.4 % (ref 34.8–46.6)
LYMPH%: 21.1 % (ref 14.0–49.7)
MCH: 31.4 pg (ref 25.1–34.0)
MCHC: 34.4 g/dL (ref 31.5–36.0)
MCV: 91.2 fL (ref 79.5–101.0)
MONO%: 10.7 % (ref 0.0–14.0)
NEUT%: 65.1 % (ref 38.4–76.8)
Platelets: 229 10*3/uL (ref 145–400)

## 2011-08-20 NOTE — Progress Notes (Signed)
Hematology and Oncology Follow Up Visit  Dana Pruitt [id number] 03-15-1941 71 y.o. 08/20/2011    HPI: Dana Pruitt is a 71 year old [place] woman with a history of a locally advanced ER/PR positive right breast carcinoma for which she received neoadjuvant Femara 2.5 mg by mouth daily starting September of 2011. She has since undergone a right lumpectomy with sentinel node dissection on 07/03/2011, she continues on Femara and is currently on active radiation therapy under the care of Dr. Lurline Hare.  Interim History:   Dana Pruitt is seen today for followup pertaining to her history of a locally advanced ER/PR positive right breast carcinoma. She continues on Femara 2.5 mg per day, and is under active radiation therapy to the remaining right breast tissue under the care of Dr. Lurline Hare. She voices no complaints whatsoever, she denies fevers, chills, night sweats, shortness of breath, or chest pain. No nausea, emesis, diarrhea or constipation issues. She denies any hot flashes, no joint sensitivity. Her energy level is quite good. She notes that the skin of her right breast is holding up well, she has minimal erythema.  A detailed review of systems is otherwise noncontributory as noted below.  Review of Systems: Constitutional:  no weight loss, fever, night sweats and feels well Eyes: no complaints ENT: no complaints Cardiovascular: no chest pain or dyspnea on exertion Respiratory: no cough, shortness of breath, or wheezing Neurological: no TIA or stroke symptoms Dermatological: negative Gastrointestinal: no abdominal pain, change in bowel habits, or black or bloody stools Genito-Urinary: no dysuria, trouble voiding, or hematuria Hematological and Lymphatic: negative Breast: negative Musculoskeletal: negative Remaining ROS negative.    Medications:   I have reviewed the patient's current medications.  Current Outpatient Prescriptions  Medication Sig  Dispense Refill  . acyclovir (ZOVIRAX) 200 MG capsule Ad lib.      . Calcium Carbonate-Vitamin D (CALCIUM 500 + D) 500-125 MG-UNIT TABS Take by mouth.        . Cholecalciferol (VITAMIN D-3) 5000 UNITS TABS Take by mouth.        . DiphenhydrAMINE HCl, Sleep, (SIMPLY SLEEP PO) Take 2 tablets by mouth Nightly.       [name] letrozole (FEMARA) 2.5 MG tablet Take 1 tablet (2.5 mg total) by mouth daily.  30 tablet  0  . Omega-3 Fatty Acids (FISH OIL PO) Take by mouth.        . sertraline (ZOLOFT) 100 MG tablet Take 100 mg by mouth daily.          Allergies: No Known Allergies  Physical Exam: Filed Vitals:   08/20/11 0908  BP: 133/83  Pulse: 81  Temp: 97.9 F (36.6 C)    Body mass index is 20.80 kg/(m^2). Weight: 121 lbs. HEENT:  Sclerae anicteric, conjunctivae pink.  Oropharynx clear.  No mucositis or candidiasis.   Nodes:  No cervical, supraclavicular, or axillary lymphadenopathy palpated.  Breast Exam: Deferred.  Lungs:  Clear to auscultation bilaterally.  No crackles, rhonchi, or wheezes.   Heart:  Regular rate and rhythm.   Abdomen:  Soft, nontender.  Positive bowel sounds.  No organomegaly or masses palpated.   Musculoskeletal:  No focal spinal tenderness to palpation.  Extremities:  Benign.  No peripheral edema or cyanosis.   Skin:  Benign.   Neuro:  Nonfocal, alert and oriented x 3.   Lab Results: Lab Results  Component Value Date   WBC 4.5 08/20/2011   HGB 13.9 08/20/2011   HCT 40.4 08/20/2011   MCV  91.2 08/20/2011   PLT 229 08/20/2011   NEUTROABS 2.9 08/20/2011     Chemistry      Component Value Date/Time   NA 140 03/19/2011 1359   NA 140 03/19/2011 1359   K 4.1 03/19/2011 1359   K 4.1 03/19/2011 1359   CL 104 03/19/2011 1359   CL 104 03/19/2011 1359   CO2 25 03/19/2011 1359   CO2 25 03/19/2011 1359   BUN 19 03/19/2011 1359   BUN 19 03/19/2011 1359   CREATININE 1.19* 03/19/2011 1359   CREATININE 1.19* 03/19/2011 1359      Component Value Date/Time   CALCIUM 10.3 03/19/2011 1359     CALCIUM 10.3 03/19/2011 1359   ALKPHOS 43 03/19/2011 1359   ALKPHOS 43 03/19/2011 1359   AST 20 03/19/2011 1359   AST 20 03/19/2011 1359   ALT 15 03/19/2011 1359   ALT 15 03/19/2011 1359   BILITOT 0.5 03/19/2011 1359   BILITOT 0.5 03/19/2011 1359      Lab Results  Component Value Date   LABCA2 30 11/12/2010   LABCA2 30 11/12/2010    Pathology reports: 07/03/11 FINAL DIAGNOSIS Diagnosis 1. Breast, lumpectomy, Right - INVASIVE GRADE II DUCTAL CARCINOMA WITH ASSOCIATED CALCIFICATION, SPANNING 2.3 CM. - INTERMEDIATE GRADE DUCTAL CARCINOMA IN SITU PRESENT. - LYMPH/VASCULAR INVASION IDENTIFIED. - MARGINS ARE NEGATIVE. - SEE ONCOLOGY TEMPLATE. 2. Lymph node, sentinel, biopsy, #1 - ONE BENIGN LYMPH NODE WITH NO TUMOR SEEN (0/1). - SEE COMMENT. 3. Lymph node, sentinel, biopsy, #2 - ONE BENIGN LYMPH NODE WITH NO TUMOR SEEN (0/1). - SEE COMMENT. 4. Lymph node, sentinel, biopsy, #3; firm - ONE BENIGN LYMPH NODE WITH NO TUMOR SEEN (0/1). - SEE COMMENT. Microscopic Comment 1. BREAST, INVASIVE TUMOR, WITH LYMPH NODE SAMPLING Specimen, including laterality: Right breast lumpectomy with sentinel lymph node biopsy. Procedure: Right breast lumpectomy with sentinel lymph node biopsy. Grade: II. Tubule formation: 3. Nuclear pleomorphism: 2. Mitotic: 1. Tumor size (gross measurement): 2.3 cm. Margins: Invasive, distance to closest margin: 0.3 cm. In situ, distance to closest margin: At least 0.6 cm. Lymph vascular invasion: Identified. Ductal carcinoma in situ: Present. Grade: Intermediate. 1 of 3 FINAL for Dana Pruitt ([id number]) Microscopic Comment(continued) Extensive intraductal component: No. Lobular neoplasia: No. Tumor focality: Unifocal. Treatment effect: Mild to moderate treatment effect present in breast. Extent of tumor: Tumor confined to breast parenchyma. Lymph nodes: # examined: 3. Lymph nodes with metastasis: 0. Breast prognostic profile: (Performed on previous  case [id number]): Estrogen receptor: 100%, positive. Progesterone receptor: 91%, positive. HER-2/neu: 1.29, no amplification. Ki-67: 22%. Non-neoplastic breast: Unremarkable. TNM: ypT2, ypN0. Comments: A Her-2/neu by CISH will be repeated on the current tumor and be reported in an addendum. A cytokeratin AE1/AE3 stain is performed on Block 1C to highlight the tumor with a concomitant E-cadherin stain performed which shows that the invasive tumor is positive, confirming its ductal nature. 2. , 3. and 4. A cytokeratin (cytokeratin AE1/AE3) stain is performed on all lymph nodes which is negative, confirming the lack of metastatic carcinoma. (RAH:eps 07/06/11) Zandra Abts MD Pathologist, Electronic Signature (Case signed 07/07/2011)  Assessment:  Dana Pruitt is a 71 year old [place] woman with a history of a locally advanced ER/PR positive, HER-2 negative tumor with a Ki-67 of 22% of the right breast carcinoma for which she received neoadjuvant Femara 2.5 mg by mouth daily starting September of 2011. She has since undergone a right lumpectomy with sentinel node dissection on 07/03/2011, which revealed ypT2, ypN0 residual disease. she continues on  Femara and is currently on active radiation therapy under the care of Dr. Lurline Hare.  Case to be reviewed with Dr. [name].  Plan:  Dana Pruitt will continue on Femara 2.5 mg by mouth daily, and continue radiation therapy as outlined by Dr. Michell Heinrich. She should be completing radiation on 09/02/2011. We will see her back in 09/29/2011 for further treatment planning. She knows we would be happy to see her in the interim if the need should arise.  This plan was reviewed with the patient, who voices understanding and agreement.  She knows to call with any changes or problems.    Varie Machamer T, PA-C 08/20/2011

## 2011-08-21 ENCOUNTER — Other Ambulatory Visit: Payer: Self-pay | Admitting: *Deleted

## 2011-08-21 ENCOUNTER — Ambulatory Visit
Admission: RE | Admit: 2011-08-21 | Discharge: 2011-08-21 | Disposition: A | Discharge status: Home / Self Care | Payer: Medicare Other | Source: Ambulatory Visit | Attending: Radiation Oncology | Admitting: Radiation Oncology

## 2011-08-21 DIAGNOSIS — C50919 Malignant neoplasm of unspecified site of unspecified female breast: Secondary | ICD-10-CM

## 2011-08-21 MED ORDER — LETROZOLE 2.5 MG PO TABS: 2.5000 mg | ORAL_TABLET | Freq: Every day | ORAL | Status: AC

## 2011-08-24 ENCOUNTER — Ambulatory Visit
Admission: RE | Admit: 2011-08-24 | Discharge: 2011-08-24 | Disposition: A | Discharge status: Home / Self Care | Payer: Medicare Other | Source: Ambulatory Visit | Attending: Radiation Oncology | Admitting: Radiation Oncology

## 2011-08-25 ENCOUNTER — Ambulatory Visit
Admission: RE | Admit: 2011-08-25 | Discharge: 2011-08-25 | Disposition: A | Discharge status: Home / Self Care | Payer: Medicare Other | Source: Ambulatory Visit | Attending: Radiation Oncology | Admitting: Radiation Oncology

## 2011-08-25 ENCOUNTER — Encounter: Payer: Self-pay | Admitting: Radiation Oncology

## 2011-08-25 DIAGNOSIS — C50419 Malignant neoplasm of upper-outer quadrant of unspecified female breast: Secondary | ICD-10-CM

## 2011-08-25 NOTE — Progress Notes (Signed)
DIAGNOSIS:  Right breast cancer.  NARRATIVE:  Dana Pruitt is seen today for weekly assessment.  She has completed 4005 cGy of a planned 5272 cGy.  The patient has started noticing some mild itching in the upper-inner aspect of the breast area. She did use hydrocortisone cream for this issue which has been helpful. The patient is being treated in a hypofractionated accelerated manner. The patient denies any fatigue at this time.  EXAMINATION:  There is radiation dermatitis in the upper-outer aspect of the breast and mild erythema throughout the remainder of the breast. There is no skin breakdown appreciated.  The lungs are clear.  The heart has regular rhythm and rate.  IMPRESSION AND PLAN:  The patient is tolerating her treatments well at this time except for issues as above.  The patient's radiation fields are setting up accurately.  The patient's radiation chart was checked today.  Plan is to continue with breast conservation therapy to a cumulative dose of 5272 cGy.    ______________________________ Billie Lade, Ph.D., M.D. JDK/MEDQ  D:  08/25/2011  T:  08/25/2011  Job:  [id number]

## 2011-08-25 NOTE — Progress Notes (Signed)
NOTED RADIATION DERMATITIS WITH ITCHING PER PT.  SKIN RED IN COLOR.  SAYS SHE IS USING CORTISONE ON RASH BUT STILL ITCHING.

## 2011-08-26 ENCOUNTER — Ambulatory Visit
Admission: RE | Admit: 2011-08-26 | Discharge: 2011-08-26 | Disposition: A | Discharge status: Home / Self Care | Payer: Medicare Other | Source: Ambulatory Visit | Attending: Radiation Oncology | Admitting: Radiation Oncology

## 2011-08-26 ENCOUNTER — Encounter: Payer: Self-pay | Admitting: Radiation Oncology

## 2011-08-26 DIAGNOSIS — C50419 Malignant neoplasm of upper-outer quadrant of unspecified female breast: Secondary | ICD-10-CM

## 2011-08-27 ENCOUNTER — Ambulatory Visit: Payer: Medicare Other

## 2011-08-27 ENCOUNTER — Ambulatory Visit
Admission: RE | Admit: 2011-08-27 | Discharge: 2011-08-27 | Disposition: A | Discharge status: Home / Self Care | Payer: Medicare Other | Source: Ambulatory Visit | Attending: Radiation Oncology | Admitting: Radiation Oncology

## 2011-08-28 ENCOUNTER — Ambulatory Visit: Payer: Medicare Other

## 2011-08-28 ENCOUNTER — Ambulatory Visit
Admission: RE | Admit: 2011-08-28 | Discharge: 2011-08-28 | Disposition: A | Discharge status: Home / Self Care | Payer: Medicare Other | Source: Ambulatory Visit | Attending: Radiation Oncology | Admitting: Radiation Oncology

## 2011-08-31 ENCOUNTER — Ambulatory Visit: Payer: Medicare Other

## 2011-08-31 ENCOUNTER — Ambulatory Visit
Admission: RE | Admit: 2011-08-31 | Discharge: 2011-08-31 | Disposition: A | Discharge status: Home / Self Care | Payer: Medicare Other | Source: Ambulatory Visit | Attending: Radiation Oncology | Admitting: Radiation Oncology

## 2011-09-01 ENCOUNTER — Ambulatory Visit
Admission: RE | Admit: 2011-09-01 | Discharge: 2011-09-01 | Disposition: A | Discharge status: Home / Self Care | Payer: Medicare Other | Source: Ambulatory Visit | Attending: Radiation Oncology | Admitting: Radiation Oncology

## 2011-09-01 ENCOUNTER — Ambulatory Visit: Payer: Medicare Other

## 2011-09-01 DIAGNOSIS — C50419 Malignant neoplasm of upper-outer quadrant of unspecified female breast: Secondary | ICD-10-CM

## 2011-09-01 NOTE — Progress Notes (Signed)
HERE TODAY FOR PUT.  CONTINUES TO HAVE RADIATION DERMATITIS, USING CORTISONE COOL ON IT.  SKIN IS BRIGHT RED IN COLOR AND SHE DOES HAVE SOME ITCHING

## 2011-09-01 NOTE — Progress Notes (Signed)
Weekly Management Note Current Dose: 50.72  Gy  Projected Dose: 52.72 Gy   Narrative:  The patient presents for routine under treatment assessment.  CBCT/MVCT images/Port film x-rays were reviewed.  The chart was checked. Doing well. Using hydrocortisone gel with good relief of itching medially. Has appt s with Donnie Coffin and Streck in next few weeks.  Physical Findings: Dermatitis medially.   Impression:  The patient is tolerating radiation.  Plan:  Continue treatment as planned. Continue femara. F/u in 1 month.

## 2011-09-02 ENCOUNTER — Ambulatory Visit
Admission: RE | Admit: 2011-09-02 | Discharge: 2011-09-02 | Disposition: A | Discharge status: Home / Self Care | Payer: Medicare Other | Source: Ambulatory Visit | Attending: Radiation Oncology | Admitting: Radiation Oncology

## 2011-09-11 ENCOUNTER — Ambulatory Visit (INDEPENDENT_AMBULATORY_CARE_PROVIDER_SITE_OTHER): Payer: Medicare Other | Admitting: Surgery

## 2011-09-11 ENCOUNTER — Encounter (INDEPENDENT_AMBULATORY_CARE_PROVIDER_SITE_OTHER): Payer: Self-pay | Admitting: Surgery

## 2011-09-11 VITALS — BP 118/74 | HR 76 | Temp 98.1°F | Resp 18 | Ht 64.0 in | Wt 120.6 lb

## 2011-09-11 DIAGNOSIS — Z9889 Other specified postprocedural states: Secondary | ICD-10-CM

## 2011-09-11 DIAGNOSIS — C50419 Malignant neoplasm of upper-outer quadrant of unspecified female breast: Secondary | ICD-10-CM

## 2011-09-11 NOTE — Progress Notes (Signed)
NAME: Dana Pruitt       DOB: 1940/11/21           DATE: 09/11/2011       MRN: [id number]   [name] is a 71 y.o.Marland Kitchenfemale who presents for routine followup of her Right breast cancer diagnosed in 2011 and treated with neoadjuvant hormonal, lumpectomy with SLN, radiation, continuing on Femara. She has no problems or concerns on either side.  PFSH: She has had no significant changes since the last visit here.  ROS: There have been no significant changes since the last visit here  EXAM: General: The patient is alert, oriented, generally healty appearing, NAD. Mood and affect are normal.  Breasts:  Right show mild radiation changes above the incision. Otherwise normal post op  Lymphatics: She has no axillary or supraclavicular adenopathy on either side.  Extremities: Full ROM of the surgical side with no lymphedema noted.  Data Reviewed: Rad. notes  Impression: Doing well, with no evidence of recurrent cancer or new cancer  Plan: RTC six months

## 2011-09-11 NOTE — Patient Instructions (Signed)
See me again in six months 

## 2011-09-17 ENCOUNTER — Encounter: Payer: Self-pay | Admitting: Radiation Oncology

## 2011-09-17 NOTE — Progress Notes (Signed)
  Radiation Oncology         [phone] ________________________________  Name: Dana Pruitt MRN: [id number]  Date: 09/17/2011  DOB: May 05, 1941  End of Treatment Note  Diagnosis:   T2N0 Right Breast Cancer    Indication for treatment:  Curative       Radiation treatment dates:   08/05/11-09/02/11  Site/dose:    1) Right Breast 2) Right Breast Boost  Beams/energy:    1) 42.72 Gy at 2.67 Gy/fraction / 6 MV photons 2) 10 Gy at 2 Gy/fraction / 12 MeV electrons  Narrative: The patient tolerated radiation treatment relatively well.   She had minimal skin reaction which was treated with Radiplex gel.   Plan: The patient has completed radiation treatment. The patient will return to radiation oncology clinic for routine followup in one month. I advised them to call or return sooner if they have any questions or concerns related to their recovery or treatment. ___________________________

## 2011-09-29 ENCOUNTER — Telehealth: Payer: Self-pay | Admitting: *Deleted

## 2011-09-29 ENCOUNTER — Ambulatory Visit (HOSPITAL_BASED_OUTPATIENT_CLINIC_OR_DEPARTMENT_OTHER): Payer: Medicare Other | Admitting: Oncology

## 2011-09-29 VITALS — BP 144/74 | HR 76 | Temp 97.7°F | Ht 64.0 in | Wt 120.1 lb

## 2011-09-29 DIAGNOSIS — E559 Vitamin D deficiency, unspecified: Secondary | ICD-10-CM

## 2011-09-29 DIAGNOSIS — Z170 Estrogen receptor positive status [ER+]: Secondary | ICD-10-CM

## 2011-09-29 DIAGNOSIS — C50919 Malignant neoplasm of unspecified site of unspecified female breast: Secondary | ICD-10-CM

## 2011-09-29 MED ORDER — DENOSUMAB 60 MG/ML ~~LOC~~ SOLN: 60.0000 mg | Freq: Once | SUBCUTANEOUS | Status: DC

## 2011-09-29 NOTE — Progress Notes (Signed)
Hematology and Oncology Follow Up Visit  Dana Pruitt [id number] [birth year] 71 y.o. 09/29/2011 11:36 AM   Principle Diagnosis: 71 year old woman with history of locally advanced ER PR positive breast cancer on neoadjuvant Femara therapy dating back to September 2011  Interim History:  She is doing well she tolerates Femara well. She had her surgery at the end of 1/13 which showed a T2n0 tumor that was er/pr+. She completed xrt 3/13. She has no other complaints. She has experienced mild fatigue. Medications: I have reviewed the patient's current medications.  Allergies: No Known Allergies  Past Medical History, Surgical history, Social history, and Family History were reviewed and updated.  Review of Systems: Constitutional:  Negative for fever, chills, night sweats, anorexia, weight loss, pain. Cardiovascular: no chest pain or dyspnea on exertion Respiratory: no cough, shortness of breath, or wheezing Neurological: no TIA or stroke symptoms Dermatological: negative ENT: negative Skin Gastrointestinal: no abdominal pain, change in bowel habits, or black or bloody stools Genito-Urinary: no dysuria, trouble voiding, or hematuria Hematological and Lymphatic: negative Breast: negative for breast lumps Musculoskeletal: negative Remaining ROS negative.  Physical Exam: Blood pressure 144/74, pulse 76, temperature 97.7 F (36.5 C), height 5\' 4"  (1.626 m), weight 120 lb 1.6 oz (54.477 kg). ECOG: 0 General appearance: alert, cooperative and appears stated age Head: Normocephalic, without obvious abnormality, atraumatic Neck: no adenopathy, no carotid bruit, no JVD, supple, symmetrical, trachea midline and thyroid not enlarged, symmetric, no tenderness/mass/nodules Lymph nodes: Cervical, supraclavicular, and axillary nodes normal. Cardiac : Normal heart sounds Pulmonary: Normal breath sounds Breasts: Fibrocystic changes in both breasts without discrete mass in the right breast. , mild  erythema, some fluctuance , perhaps secondary to seroma. Abdomen: Nontender, no organomegaly or masses Extremities normal no clubbing or cyanosis Neuro: Normal  Lab Results: Lab Results  Component Value Date   WBC 4.5 08/20/2011   HGB 13.9 08/20/2011   HCT 40.4 08/20/2011   MCV 91.2 08/20/2011   PLT 229 08/20/2011     Chemistry      Component Value Date/Time   NA 139 08/20/2011 0934   K 4.4 08/20/2011 0934   CL 102 08/20/2011 0934   CO2 27 08/20/2011 0934   BUN 17 08/20/2011 0934   CREATININE 1.15* 08/20/2011 0934      Component Value Date/Time   CALCIUM 10.8* 08/20/2011 0934   ALKPHOS 36* 08/20/2011 0934   AST 15 08/20/2011 0934   ALT 13 08/20/2011 0934   BILITOT 0.5 08/20/2011 0934       Radiological Studies: chest X-ray n/a Mammogram Due 5/13 Bone density Due 5/13  Impression and Plan: Pleasant postmenopausal women on neoadjuvant hormonal therapy for a year s/p surgery/xrt, will continue to monitor, continue prolia.repeat mammogram and bone density. More than 50% of the visit was spent in patient-related counselling   [name], MD 4/9/201311:36 AM

## 2011-09-29 NOTE — Telephone Encounter (Signed)
made patient appointment for the mammogram and bone denstiy  at [facility] gave the patient appointment for lab one week before and md appointment after

## 2011-10-01 ENCOUNTER — Ambulatory Visit: Payer: Medicare Other | Admitting: Radiation Oncology

## 2011-10-02 ENCOUNTER — Telehealth: Payer: Self-pay | Admitting: *Deleted

## 2011-10-15 ENCOUNTER — Ambulatory Visit
Admission: RE | Admit: 2011-10-15 | Discharge: 2011-10-15 | Disposition: A | Discharge status: Home / Self Care | Payer: Medicare Other | Source: Ambulatory Visit | Attending: Radiation Oncology | Admitting: Radiation Oncology

## 2011-10-15 ENCOUNTER — Encounter: Payer: Self-pay | Admitting: Radiation Oncology

## 2011-10-15 VITALS — BP 133/80 | HR 81 | Temp 97.8°F | Resp 18 | Wt 119.1 lb

## 2011-10-15 DIAGNOSIS — C50419 Malignant neoplasm of upper-outer quadrant of unspecified female breast: Secondary | ICD-10-CM

## 2011-10-15 NOTE — Progress Notes (Signed)
HERE TODAY FOR FU OF RIGHT BREAST CANCER.  HAS HAD RASPY VOICE SINCE TX AND NOT GOING AWAY.  NO PROBLEM SWALLOWING AND NO PAIN.  SHE WONDERS IF THIS WAS CAUSED BY TX.  SKIN LOOKS GOOD.  NO OTHER C/O

## 2011-10-15 NOTE — Progress Notes (Signed)
   Department of Radiation Oncology  Phone:  [phone] Fax:        [phone]   Name: Dana Pruitt   DOB: 1941-06-10  MRN: [id number]    Date: 10/15/2011  Follow Up Visit Note  CC: Dana Moh, MD  Diagnosis: T2 N0 right breast cancer  Interval since last radiation: A month  Allergies: No Known Allergies  Medications:  Current Outpatient Prescriptions  Medication Sig Dispense Refill  . acyclovir (ZOVIRAX) 200 MG capsule Ad lib.      . Calcium Carbonate-Vitamin D (CALCIUM 500 + D) 500-125 MG-UNIT TABS Take by mouth.        . Cholecalciferol (VITAMIN D-3) 5000 UNITS TABS Take by mouth.        . DiphenhydrAMINE HCl, Sleep, (SIMPLY SLEEP PO) Take 2 tablets by mouth Nightly.       [name] letrozole (FEMARA) 2.5 MG tablet Take 2.5 mg by mouth daily.      . Omega-3 Fatty Acids (FISH OIL PO) Take by mouth.        . sertraline (ZOLOFT) 100 MG tablet Take 100 mg by mouth daily.          Interval History: Dana Pruitt presents today for routine followup.  Dana Pruitt has healed up well. Dana Pruitt developed some hoarseness and sore throat after treatments. Dana Pruitt is tolerating Dana Pruitt Femara well. Dana Pruitt has a followup appointment Dr. Donnie Pruitt in October.  Physical Exam:   weight is 119 lb 1.6 oz (54.023 kg). Dana Pruitt oral temperature is 97.8 F (36.6 C). Dana Pruitt blood pressure is 133/80 and Dana Pruitt pulse is 81. Dana Pruitt respiration is 18.  Dana Pruitt is well-healed skin. Dana Pruitt has a lumpectomy scar in the upper aspect of the right breast. There is no telangiectasias. There is some pink skin and around Dana Pruitt scar.  IMPRESSION: T2 N0 right breast cancer recovering from radiation  PLAN:  Dana Pruitt is a 71 y.o. female who has recovered nicely from radiation. We discussed the use of sunscreen in areas that have been exposed to radiation. We discussed using an antihistamine like Claritin Dana Pruitt Zyrtec. We discussed Dana Pruitt sore throat and hoarseness were not related to radiation. I have not scheduled followup with Dana Pruitt. Dana Pruitt is regular scheduled  followup with Dr. Donnie Pruitt. Dana Pruitt does Dana Pruitt can contact us with any questions or concerns.    Dana Hare, MD

## 2011-11-13 ENCOUNTER — Other Ambulatory Visit: Payer: Self-pay | Admitting: Oncology

## 2011-11-13 DIAGNOSIS — C50919 Malignant neoplasm of unspecified site of unspecified female breast: Secondary | ICD-10-CM

## 2011-11-13 MED ORDER — DENOSUMAB 60 MG/ML ~~LOC~~ SOLN: 60.0000 mg | Freq: Once | SUBCUTANEOUS | Status: DC

## 2011-11-17 ENCOUNTER — Ambulatory Visit (HOSPITAL_BASED_OUTPATIENT_CLINIC_OR_DEPARTMENT_OTHER): Payer: Medicare Other

## 2011-11-17 VITALS — BP 157/91 | HR 76 | Temp 98.1°F

## 2011-11-17 DIAGNOSIS — C50919 Malignant neoplasm of unspecified site of unspecified female breast: Secondary | ICD-10-CM

## 2011-11-17 DIAGNOSIS — C50419 Malignant neoplasm of upper-outer quadrant of unspecified female breast: Secondary | ICD-10-CM

## 2011-11-17 DIAGNOSIS — M810 Age-related osteoporosis without current pathological fracture: Secondary | ICD-10-CM

## 2011-11-17 MED ORDER — DENOSUMAB 120 MG/1.7ML ~~LOC~~ SOLN: 60.0000 mg | Freq: Once | SUBCUTANEOUS | Status: DC

## 2011-11-17 MED ORDER — DENOSUMAB 60 MG/ML ~~LOC~~ SOLN
60.0000 mg | Freq: Once | SUBCUTANEOUS | Status: AC
  Administered 2011-11-17: 60 mg via SUBCUTANEOUS
  Filled 2011-11-17: qty 1

## 2012-03-14 ENCOUNTER — Ambulatory Visit (INDEPENDENT_AMBULATORY_CARE_PROVIDER_SITE_OTHER): Payer: Medicare Other | Admitting: Surgery

## 2012-03-14 ENCOUNTER — Encounter (INDEPENDENT_AMBULATORY_CARE_PROVIDER_SITE_OTHER): Payer: Self-pay | Admitting: Surgery

## 2012-03-14 VITALS — BP 138/86 | HR 76 | Temp 98.4°F | Resp 16 | Ht 64.0 in | Wt 120.8 lb

## 2012-03-14 DIAGNOSIS — Z853 Personal history of malignant neoplasm of breast: Secondary | ICD-10-CM

## 2012-03-14 NOTE — Progress Notes (Signed)
NAME: Dana Pruitt       DOB: 12-06-1940           DATE: 03/14/2012       MRN: [id number]   Dana Pruitt is a 71 y.o.Marland Kitchenfemale who presents for routine followup of her Right breast cancer diagnosed in 2011 and treated with neoadjuvant hormonal, lumpectomy with SLN, radiation, continuing on Femara. She has no problems or concerns on either side.  PFSH: She has had no significant changes since the last visit here.  ROS: There have been no significant changes since the last visit here  EXAM: General: The patient is alert, oriented, generally healty appearing, NAD. Mood and affect are normal.  Breasts:  Right show mild radiation changes above the incision. Otherwise normal post op  Lymphatics: She has no axillary or supraclavicular adenopathy on either side.  Extremities: Full ROM of the surgical side with no lymphedema noted.  Data Reviewed: Mammogram a few weeks ago was OK  Impression: Doing well, with no evidence of recurrent cancer or new cancer  Plan: RTC six months

## 2012-03-14 NOTE — Patient Instructions (Signed)
See me one more time in six months. When you next have a mammogram ask about a 3-D mammogram

## 2012-03-31 ENCOUNTER — Other Ambulatory Visit (HOSPITAL_BASED_OUTPATIENT_CLINIC_OR_DEPARTMENT_OTHER): Payer: Medicare Other | Admitting: Lab

## 2012-03-31 DIAGNOSIS — E559 Vitamin D deficiency, unspecified: Secondary | ICD-10-CM

## 2012-03-31 DIAGNOSIS — C50919 Malignant neoplasm of unspecified site of unspecified female breast: Secondary | ICD-10-CM

## 2012-03-31 LAB — CBC WITH DIFFERENTIAL/PLATELET
BASO%: 0.5 % (ref 0.0–2.0)
HCT: 37.2 % (ref 34.8–46.6)
LYMPH%: 21.4 % (ref 14.0–49.7)
MCHC: 35 g/dL (ref 31.5–36.0)
MONO#: 0.4 10*3/uL (ref 0.1–0.9)
NEUT%: 67.8 % (ref 38.4–76.8)
Platelets: 241 10*3/uL (ref 145–400)
WBC: 5.5 10*3/uL (ref 3.9–10.3)

## 2012-03-31 LAB — COMPREHENSIVE METABOLIC PANEL (CC13)
BUN: 17 mg/dL (ref 7.0–26.0)
CO2: 23 mEq/L (ref 22–29)
Calcium: 10.3 mg/dL (ref 8.4–10.4)
Chloride: 105 mEq/L (ref 98–107)
Creatinine: 1.2 mg/dL — ABNORMAL HIGH (ref 0.6–1.1)

## 2012-04-07 ENCOUNTER — Ambulatory Visit (HOSPITAL_BASED_OUTPATIENT_CLINIC_OR_DEPARTMENT_OTHER): Payer: Medicare Other | Admitting: Oncology

## 2012-04-07 ENCOUNTER — Telehealth: Payer: Self-pay | Admitting: Oncology

## 2012-04-07 VITALS — BP 152/89 | HR 77 | Temp 97.4°F | Resp 20 | Ht 64.0 in | Wt 123.2 lb

## 2012-04-07 DIAGNOSIS — E559 Vitamin D deficiency, unspecified: Secondary | ICD-10-CM

## 2012-04-07 DIAGNOSIS — C50419 Malignant neoplasm of upper-outer quadrant of unspecified female breast: Secondary | ICD-10-CM

## 2012-04-07 DIAGNOSIS — Z170 Estrogen receptor positive status [ER+]: Secondary | ICD-10-CM

## 2012-04-07 DIAGNOSIS — C50919 Malignant neoplasm of unspecified site of unspecified female breast: Secondary | ICD-10-CM

## 2012-04-07 NOTE — Progress Notes (Signed)
Hematology and Oncology Follow Up Visit  Dana Pruitt [id number] 12/28/1940 71 y.o. 04/07/2012 10:46 AM   Principle Diagnosis: 71 year old woman with history of locally advanced ER PR positive breast cancer on neoadjuvant Femara therapy dating back to September 2011  Interim History:  She is doing well she tolerates Femara well. She had her surgery at the end of 1/13 which showed a T2n0 tumor that was er/pr+. She completed xrt 3/13. She has no other complaints. She has experienced mild fatigue. Medications: I have reviewed the patient's current medications.  Allergies: No Known Allergies  Past Medical History, Surgical history, Social history, and Family History were reviewed and updated.  Review of Systems: Constitutional:  Negative for fever, chills, night sweats, anorexia, weight loss, pain. Cardiovascular: no chest pain or dyspnea on exertion Respiratory: no cough, shortness of breath, or wheezing Neurological: no TIA or stroke symptoms Dermatological: negative ENT: negative Skin Gastrointestinal: no abdominal pain, change in bowel habits, or black or bloody stools Genito-Urinary: no dysuria, trouble voiding, or hematuria Hematological and Lymphatic: negative Breast: negative for breast lumps Musculoskeletal: negative Remaining ROS negative.  Physical Exam: Blood pressure 152/89, pulse 77, temperature 97.4 F (36.3 C), resp. rate 20, height 5\' 4"  (1.626 m), weight 123 lb 3.2 oz (55.883 kg). ECOG: 0 General appearance: alert, cooperative and appears stated age Head: Normocephalic, without obvious abnormality, atraumatic Neck: no adenopathy, no carotid bruit, no JVD, supple, symmetrical, trachea midline and thyroid not enlarged, symmetric, no tenderness/mass/nodules Lymph nodes: Cervical, supraclavicular, and axillary nodes normal. Cardiac : Normal heart sounds Pulmonary: Normal breath sounds Breasts: Fibrocystic changes in both breasts without discrete mass in the right  breast. , mild erythema, some fluctuance , perhaps secondary to seroma. Abdomen: Nontender, no organomegaly or masses Extremities normal no clubbing or cyanosis Neuro: Normal  Lab Results: Lab Results  Component Value Date   WBC 5.5 03/31/2012   HGB 13.0 03/31/2012   HCT 37.2 03/31/2012   MCV 92.9 03/31/2012   PLT 241 03/31/2012     Chemistry      Component Value Date/Time   NA 138 03/31/2012 1410   NA 139 08/20/2011 0934   K 4.6 03/31/2012 1410   K 4.4 08/20/2011 0934   CL 105 03/31/2012 1410   CL 102 08/20/2011 0934   CO2 23 03/31/2012 1410   CO2 27 08/20/2011 0934   BUN 17.0 03/31/2012 1410   BUN 17 08/20/2011 0934   CREATININE 1.2* 03/31/2012 1410   CREATININE 1.15* 08/20/2011 0934      Component Value Date/Time   CALCIUM 10.3 03/31/2012 1410   CALCIUM 10.8* 08/20/2011 0934   ALKPHOS 45 03/31/2012 1410   ALKPHOS 36* 08/20/2011 0934   AST 17 03/31/2012 1410   AST 15 08/20/2011 0934   ALT 13 03/31/2012 1410   ALT 13 08/20/2011 0934   BILITOT 0.50 03/31/2012 1410   BILITOT 0.5 08/20/2011 0934       Radiological Studies: chest X-ray n/a Mammogram Due 5/13 Bone density Due 5/13  Impression and Plan: Pleasant postmenopausal women on neoadjuvant hormonal therapy for a year s/p surgery/xrt, will continue to monitor, continue prolia.repeat mammogram and bone density. More than 50% of the visit was spent in patient-related counselling   [name], MD 10/17/201310:46 AM

## 2012-04-07 NOTE — Telephone Encounter (Signed)
gve the pt her nov 2013 appt calendar along with the may 2014 appt calendar.

## 2012-04-07 NOTE — Patient Instructions (Signed)
  PLEASE TAKE 2000 UNITS OF VITAMIN D3 EVERY DAY

## 2012-04-07 NOTE — Progress Notes (Signed)
Hematology and Oncology Follow Up Visit  Dana Pruitt [id number] 1940-08-05 71 y.o. 04/07/2012 11:10 AM   Principle Diagnosis: 71 year old woman with history of locally advanced ER PR positive breast cancer on neoadjuvant Femara therapy dating back to September 2011  Interim History:  She is doing well she tolerates Femara well. She had her surgery at the end of 1/13 which showed a T2n0 tumor that was er/pr+. She completed xrt 3/13. She has no other complaints. She has experienced mild fatigue. She is due for prolia at the end of November. Her last bone density was in July 2013. She's not taking vitamin D as she should. Medications: I have reviewed the patient's current medications.  Allergies: No Known Allergies  Past Medical History, Surgical history, Social history, and Family History were reviewed and updated.  Review of Systems: Constitutional:  Negative for fever, chills, night sweats, anorexia, weight loss, pain. Cardiovascular: no chest pain or dyspnea on exertion Respiratory: no cough, shortness of breath, or wheezing Neurological: no TIA or stroke symptoms Dermatological: negative ENT: negative Skin Gastrointestinal: no abdominal pain, change in bowel habits, or black or bloody stools Genito-Urinary: no dysuria, trouble voiding, or hematuria Hematological and Lymphatic: negative Breast: negative for breast lumps Musculoskeletal: negative Remaining ROS negative.  Physical Exam: Blood pressure 152/89, pulse 77, temperature 97.4 F (36.3 C), resp. rate 20, height 5\' 4"  (1.626 m), weight 123 lb 3.2 oz (55.883 kg). ECOG: 0 General appearance: alert, cooperative and appears stated age Head: Normocephalic, without obvious abnormality, atraumatic Neck: no adenopathy, no carotid bruit, no JVD, supple, symmetrical, trachea midline and thyroid not enlarged, symmetric, no tenderness/mass/nodules Lymph nodes: Cervical, supraclavicular, and axillary nodes normal. Cardiac :  Normal heart sounds Pulmonary: Normal breath sounds Breasts: Fibrocystic changes in both breasts without discrete mass in the right breast. , mild erythema, some fluctuance , perhaps secondary to seroma. She has some tiny discolored erythematous there is overlying the scar which are chronic. Abdomen: Nontender, no organomegaly or masses Extremities normal no clubbing or cyanosis Neuro: Normal  Lab Results: Lab Results  Component Value Date   WBC 5.5 03/31/2012   HGB 13.0 03/31/2012   HCT 37.2 03/31/2012   MCV 92.9 03/31/2012   PLT 241 03/31/2012     Chemistry      Component Value Date/Time   NA 138 03/31/2012 1410   NA 139 08/20/2011 0934   K 4.6 03/31/2012 1410   K 4.4 08/20/2011 0934   CL 105 03/31/2012 1410   CL 102 08/20/2011 0934   CO2 23 03/31/2012 1410   CO2 27 08/20/2011 0934   BUN 17.0 03/31/2012 1410   BUN 17 08/20/2011 0934   CREATININE 1.2* 03/31/2012 1410   CREATININE 1.15* 08/20/2011 0934      Component Value Date/Time   CALCIUM 10.3 03/31/2012 1410   CALCIUM 10.8* 08/20/2011 0934   ALKPHOS 45 03/31/2012 1410   ALKPHOS 36* 08/20/2011 0934   AST 17 03/31/2012 1410   AST 15 08/20/2011 0934   ALT 13 03/31/2012 1410   ALT 13 08/20/2011 0934   BILITOT 0.50 03/31/2012 1410   BILITOT 0.5 08/20/2011 4098       Radiological Studies: chest X-ray n/a Mammogram Due 5/13-nlBone density   Impression and Plan: Pleasant postmenopausal women on neoadjuvant hormonal therapy for a year s/p surgery/xrt, will continue to monitor, continue prolia., She will of the back again in November for another injection and I will see her in May for another injection. Followup bone density 2015. We will  try to alternate visits with her surgeon. I recommend she take additional vitamin D 2000 units per day. Vitamin D level today is low More than 50% of the visit was spent in patient-related counselling   Bevelyn Arriola, MD 10/17/201311:10 AM

## 2012-05-20 ENCOUNTER — Ambulatory Visit (HOSPITAL_BASED_OUTPATIENT_CLINIC_OR_DEPARTMENT_OTHER): Payer: Medicare Other

## 2012-05-20 VITALS — BP 137/77 | HR 77 | Temp 97.4°F

## 2012-05-20 DIAGNOSIS — C801 Malignant (primary) neoplasm, unspecified: Secondary | ICD-10-CM

## 2012-05-20 DIAGNOSIS — M810 Age-related osteoporosis without current pathological fracture: Secondary | ICD-10-CM

## 2012-05-20 DIAGNOSIS — C50419 Malignant neoplasm of upper-outer quadrant of unspecified female breast: Secondary | ICD-10-CM

## 2012-05-20 MED ORDER — DENOSUMAB 60 MG/ML ~~LOC~~ SOLN
60.0000 mg | Freq: Once | SUBCUTANEOUS | Status: AC
  Administered 2012-05-20: 60 mg via SUBCUTANEOUS
  Filled 2012-05-20: qty 1

## 2012-07-13 ENCOUNTER — Telehealth: Payer: Self-pay | Admitting: Oncology

## 2012-07-13 NOTE — Telephone Encounter (Signed)
The patient received her letter about Dr. Donnie Coffin.   She would like to reschedule with Dr> Magrinat.   I explained that she will receive a call soon to reschedule as these reschedules are being done chronologically.   I sent an email to Jule Ser with her request.

## 2012-08-19 ENCOUNTER — Telehealth: Payer: Self-pay | Admitting: *Deleted

## 2012-08-19 NOTE — Telephone Encounter (Signed)
Received call from patient to reschedule her appt. Confirmed appt. For 11/16/12 at 1000 with Bernell List.  Then will become Dr. Darnelle Catalan.

## 2012-09-13 ENCOUNTER — Other Ambulatory Visit: Payer: Self-pay | Admitting: *Deleted

## 2012-09-13 DIAGNOSIS — E559 Vitamin D deficiency, unspecified: Secondary | ICD-10-CM

## 2012-09-13 DIAGNOSIS — C50919 Malignant neoplasm of unspecified site of unspecified female breast: Secondary | ICD-10-CM

## 2012-09-13 MED ORDER — LETROZOLE 2.5 MG PO TABS: 2.5000 mg | ORAL_TABLET | Freq: Every day | ORAL | Status: DC

## 2012-10-17 ENCOUNTER — Encounter (HOSPITAL_COMMUNITY): Payer: Self-pay | Admitting: General Practice

## 2012-10-17 ENCOUNTER — Inpatient Hospital Stay (HOSPITAL_COMMUNITY)
Admission: AD | Admit: 2012-10-17 | Discharge: 2012-10-20 | DRG: 193 | Disposition: A | Discharge status: Home / Self Care | Payer: Medicare Other | Source: Ambulatory Visit | Attending: Internal Medicine | Admitting: Internal Medicine

## 2012-10-17 DIAGNOSIS — E876 Hypokalemia: Secondary | ICD-10-CM | POA: Diagnosis not present

## 2012-10-17 DIAGNOSIS — Z79899 Other long term (current) drug therapy: Secondary | ICD-10-CM

## 2012-10-17 DIAGNOSIS — E860 Dehydration: Secondary | ICD-10-CM

## 2012-10-17 DIAGNOSIS — C50919 Malignant neoplasm of unspecified site of unspecified female breast: Secondary | ICD-10-CM | POA: Diagnosis present

## 2012-10-17 DIAGNOSIS — R5381 Other malaise: Secondary | ICD-10-CM

## 2012-10-17 DIAGNOSIS — F3289 Other specified depressive episodes: Secondary | ICD-10-CM | POA: Diagnosis present

## 2012-10-17 DIAGNOSIS — D649 Anemia, unspecified: Secondary | ICD-10-CM | POA: Diagnosis present

## 2012-10-17 DIAGNOSIS — Z87891 Personal history of nicotine dependence: Secondary | ICD-10-CM

## 2012-10-17 DIAGNOSIS — J9601 Acute respiratory failure with hypoxia: Secondary | ICD-10-CM | POA: Diagnosis present

## 2012-10-17 DIAGNOSIS — J9600 Acute respiratory failure, unspecified whether with hypoxia or hypercapnia: Secondary | ICD-10-CM

## 2012-10-17 DIAGNOSIS — F329 Major depressive disorder, single episode, unspecified: Secondary | ICD-10-CM | POA: Diagnosis present

## 2012-10-17 DIAGNOSIS — Z8249 Family history of ischemic heart disease and other diseases of the circulatory system: Secondary | ICD-10-CM

## 2012-10-17 DIAGNOSIS — F32A Depression, unspecified: Secondary | ICD-10-CM | POA: Diagnosis present

## 2012-10-17 DIAGNOSIS — J189 Pneumonia, unspecified organism: Principal | ICD-10-CM

## 2012-10-17 DIAGNOSIS — C50411 Malignant neoplasm of upper-outer quadrant of right female breast: Secondary | ICD-10-CM | POA: Diagnosis present

## 2012-10-17 DIAGNOSIS — F411 Generalized anxiety disorder: Secondary | ICD-10-CM | POA: Diagnosis present

## 2012-10-17 HISTORY — DX: Anemia, unspecified: D64.9

## 2012-10-17 HISTORY — DX: Pneumonia, unspecified organism: J18.9

## 2012-10-17 HISTORY — DX: Shortness of breath: R06.02

## 2012-10-17 HISTORY — DX: Malignant neoplasm of unspecified site of unspecified female breast: C50.919

## 2012-10-17 LAB — URINALYSIS, ROUTINE W REFLEX MICROSCOPIC
Glucose, UA: NEGATIVE mg/dL
Leukocytes, UA: NEGATIVE
Nitrite: NEGATIVE
Specific Gravity, Urine: 1.015 (ref 1.005–1.030)
pH: 6.5 (ref 5.0–8.0)

## 2012-10-17 LAB — BASIC METABOLIC PANEL
BUN: 16 mg/dL (ref 6–23)
Chloride: 95 mEq/L — ABNORMAL LOW (ref 96–112)
Creatinine, Ser: 0.84 mg/dL (ref 0.50–1.10)
GFR calc non Af Amer: 69 mL/min — ABNORMAL LOW (ref >90)
Glucose, Bld: 195 mg/dL — ABNORMAL HIGH (ref 70–99)
Potassium: 3.6 mEq/L (ref 3.5–5.1)

## 2012-10-17 LAB — CBC
Hemoglobin: 11 g/dL — ABNORMAL LOW (ref 12.0–15.0)
MCHC: 34.3 g/dL (ref 30.0–36.0)
WBC: 7.9 10*3/uL (ref 4.0–10.5)

## 2012-10-17 LAB — STREP PNEUMONIAE URINARY ANTIGEN: Strep Pneumo Urinary Antigen: NEGATIVE

## 2012-10-17 MED ORDER — GUAIFENESIN-CODEINE 100-10 MG/5ML PO SOLN
5.0000 mL | Freq: Three times a day (TID) | ORAL | Status: DC | PRN
  Administered 2012-10-17 – 2012-10-19 (×3): 5 mL via ORAL
  Filled 2012-10-17 (×3): qty 5

## 2012-10-17 MED ORDER — DIPHENHYDRAMINE HCL 25 MG PO CAPS: 25.0000 mg | ORAL_CAPSULE | Freq: Every evening | ORAL | Status: DC | PRN

## 2012-10-17 MED ORDER — SODIUM CHLORIDE 0.9 % IV SOLN
INTRAVENOUS | Status: DC
  Administered 2012-10-17 – 2012-10-18 (×2): via INTRAVENOUS

## 2012-10-17 MED ORDER — ZOLPIDEM TARTRATE 5 MG PO TABS: 5.0000 mg | ORAL_TABLET | Freq: Every evening | ORAL | Status: DC | PRN

## 2012-10-17 MED ORDER — SERTRALINE HCL 100 MG PO TABS
100.0000 mg | ORAL_TABLET | Freq: Every day | ORAL | Status: DC
  Administered 2012-10-18: 100 mg via ORAL
  Filled 2012-10-17: qty 1

## 2012-10-17 MED ORDER — DM-GUAIFENESIN ER 30-600 MG PO TB12
1.0000 | ORAL_TABLET | Freq: Two times a day (BID) | ORAL | Status: DC
  Administered 2012-10-17 – 2012-10-20 (×7): 1 via ORAL
  Filled 2012-10-17 (×9): qty 1

## 2012-10-17 MED ORDER — LETROZOLE 2.5 MG PO TABS
2.5000 mg | ORAL_TABLET | Freq: Every day | ORAL | Status: DC
  Administered 2012-10-18 – 2012-10-20 (×3): 2.5 mg via ORAL
  Filled 2012-10-17 (×3): qty 1

## 2012-10-17 MED ORDER — DEXTROSE 5 % IV SOLN
500.0000 mg | INTRAVENOUS | Status: DC
  Administered 2012-10-17 – 2012-10-18 (×2): 500 mg via INTRAVENOUS
  Filled 2012-10-17 (×2): qty 500

## 2012-10-17 MED ORDER — ENOXAPARIN SODIUM 40 MG/0.4ML ~~LOC~~ SOLN
40.0000 mg | SUBCUTANEOUS | Status: DC
  Administered 2012-10-17 – 2012-10-19 (×3): 40 mg via SUBCUTANEOUS
  Filled 2012-10-17 (×3): qty 0.4

## 2012-10-17 MED ORDER — LEVALBUTEROL HCL 0.63 MG/3ML IN NEBU
0.6300 mg | INHALATION_SOLUTION | Freq: Four times a day (QID) | RESPIRATORY_TRACT | Status: DC
  Administered 2012-10-17 – 2012-10-18 (×5): 0.63 mg via RESPIRATORY_TRACT
  Filled 2012-10-17 (×9): qty 3

## 2012-10-17 MED ORDER — LORATADINE 10 MG PO TABS
10.0000 mg | ORAL_TABLET | Freq: Every day | ORAL | Status: DC
  Administered 2012-10-17 – 2012-10-20 (×4): 10 mg via ORAL
  Filled 2012-10-17 (×4): qty 1

## 2012-10-17 MED ORDER — PANTOPRAZOLE SODIUM 40 MG PO TBEC
40.0000 mg | DELAYED_RELEASE_TABLET | Freq: Every day | ORAL | Status: DC
  Administered 2012-10-18 – 2012-10-20 (×3): 40 mg via ORAL
  Filled 2012-10-17 (×3): qty 1

## 2012-10-17 MED ORDER — DEXTROSE 5 % IV SOLN
1.0000 g | INTRAVENOUS | Status: DC
  Administered 2012-10-17 – 2012-10-19 (×3): 1 g via INTRAVENOUS
  Filled 2012-10-17 (×4): qty 10

## 2012-10-17 NOTE — H&P (Signed)
History and Physical       Hospital Admission Note Date: 10/17/2012  Patient name: Dana Pruitt Medical record number: [id number] Date of birth: 1940/11/05 Age: 72 y.o. Gender: female PCP: Londell Moh, MD    Chief Complaint:  2 weeks of runny nose, cough, now SOB and chills for 2 days   HPI: Patient is a 72 year old female with history of depression and breast cancer went to her PCPs office with shortness of breath and chills worsening in the last 2 days. Patient reported 2 weeks of runny nose with productive cough with yellowish phlegm. Per patient, she has been visiting her mother in assisted living facility and thinks she may have "caught something from there". She initially thought it was "allergies" but then over the last 2 days, she developed chills and shortness of breath, worse with exertion. She went to her PCPs office today, chest x-ray was done which showed bilateral pneumonia worse on the left with hypoxia. Patient was sent as a direct admit. Her O2 sats were 87% on room air. She does not use any oxygen at home.    Review of Systems:  Constitutional: + Low-grade fever, chills, diaphoresis, poor appetite and fatigue.  HEENT: Denies photophobia, eye pain, redness, hearing loss, ear pain, sneezing, mouth sores, trouble swallowing, neck pain, neck stiffness and tinnitus. + Congestion, sore throat, rhinorrhea, Respiratory: See history of present illness  Cardiovascular: Denies chest pain, palpitations and leg swelling.  Gastrointestinal: Denies nausea, vomiting, abdominal pain, diarrhea, constipation, blood in stool and abdominal distention.  Genitourinary: Denies dysuria, urgency, frequency, hematuria, flank pain and difficulty urinating.  Musculoskeletal: Denies myalgias, back pain, joint swelling, arthralgias and gait problem.  Skin: Denies pallor, rash and wound.  Neurological: Denies dizziness, seizures, syncope,  + weakness, light-headedness, numbness and headaches.  Hematological: Denies adenopathy. Easy bruising, personal or family bleeding history  Psychiatric/Behavioral: Denies suicidal ideation, mood changes, confusion, nervousness, sleep disturbance and agitation  Past Medical History: Past Medical History  Diagnosis Date  . Breast lump     right  . No pertinent past medical history   . Carcinoma of breast treated with adjuvant hormone therapy 9/11    Femara  . Anxiety   . Cancer     right breast  . IDC, RightStage II, 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   Past Surgical History  Procedure Laterality Date  . Cesarean section      Twice  . Appendectomy    . Dilation and curettage of uterus    . Colonoscopy    . Breast surgery  07/03/11    Right Breat Lumpectomy, With Sentinel Node Biopsies: Invasive Ductal Carcinoma;0/3 nodes negative,; ER,PR Positive, Her-2 Neg; Ki-67 22%  . Biopsy breast  02/27/10    Needle core Biopsy , right Breast; Invasive Mammary; ER/PR Positive, Her-2 Neu negative, Ki-67 22%    Medications: Prior to Admission medications   Medication Sig Start Date End Date Taking? Authorizing Provider  acyclovir (ZOVIRAX) 200 MG capsule Ad lib. 11/28/10   Historical Provider, MD  Calcium Carbonate-Vitamin D (CALCIUM 500 + D) 500-125 MG-UNIT TABS Take by mouth.      Historical Provider, MD  DiphenhydrAMINE HCl, Sleep, (SIMPLY SLEEP PO) Take 2 tablets by mouth Nightly.     Historical Provider, MD  letrozole (FEMARA) 2.5 MG tablet Take 1 tablet (2.5 mg total) by mouth daily. 09/13/12   Lowella Dell, MD  sertraline (ZOLOFT) 100 MG tablet Take 100 mg by mouth daily.      Historical Provider, MD  Allergies:  No Known Allergies  Social History:  reports that she quit smoking about 22 years ago. Her smoking use included Cigarettes. She has a 50 pack-year smoking history. She has never used smokeless tobacco. She reports that  drinks alcohol. She reports that she does not use illicit  drugs. Lives at home with husband and functional with ADLs  Family History: Family History  Problem Relation Age of Onset  . Cancer Neg Hx   . Heart disease Mother   . Heart disease Maternal Grandmother   . Heart disease Maternal Grandfather     Physical Exam: Blood pressure 140/78, pulse 102, temperature 98.1 F (36.7 C), temperature source Oral, resp. rate 18, height 5\' 4"  (1.626 m), weight 49.442 kg (109 lb), SpO2 95.00%. General: Alert, awake, oriented x3, in no acute distress. HEENT: normocephalic, atraumatic, anicteric sclera, pink conjunctiva, pupils equal and reactive to light and accomodation, oropharynx clear Neck: supple, no masses or lymphadenopathy, no goiter, no bruits  Heart: Regular rate and rhythm, without murmurs, rubs or gallops. Lungs: diffuse rhonchi bilaterally Abdomen: Soft, nontender, nondistended, positive bowel sounds, no masses. Extremities: No clubbing, cyanosis or edema with positive pedal pulses. Neuro: Grossly intact, no focal neurological deficits, strength 5/5 upper and lower extremities bilaterally Psych: alert and oriented x 3, normal mood and affect Skin: no rashes or lesions, warm and dry   LABS on Admission:  BMET at PCP office: Na 133, K 3.8, Ca 9.0, Cr 1.1 WBC 9.6, Hb 12.3, Hct 36.2, PLT 234   Radiological Exams on Admission: Chest Xray done today at PCP office showed: emphysema with bilateral airspace disease, worse on the left, worrisome for pneumonia.   Assessment/Plan Principal Problem:   CAP (community acquired pneumonia)  With acute respiratory failure with hypoxia: O2 sats 87% on RA, improved to 95% on 2L.  - Admit to telemetry secondary to hypoxia and tachycardia - Will obtain flu PCR, urine legionella antigen, urine strep antigen, place on IV Rocephin and the Zithromax - Placed on Xopenex nebs, O2, antitussives for supportive treatment, gentle hydration  Active Problems:   IDC, Right breast,Stage II, Receptor positive- continue  Femara    Depression- continue sertraline    Dehydration - Continue gentle hydration    Debility - Hopefully will improve next 24-48 hours, PT eval if needed  DVT prophylaxis:  Lovenox   CODE STATUS:  Full CODE STATUS   Further plan will depend as patient's clinical course evolves and further radiologic and laboratory data become available.   Time Spent on Admission: 45 mins  RAI,RIPUDEEP M.D. [facility] 10/17/2012, 1:51 PM Pager: [phone]  If 7PM-7AM, please contact night-coverage [email or web address] Password [email or web address]

## 2012-10-17 NOTE — Progress Notes (Signed)
Patient arrived to to [facility] Room 16. Alert and oriented. Oxygen via [place]. SOB with activity noted. Oriented to room. Call bell in reach. MD paged.

## 2012-10-17 NOTE — Progress Notes (Signed)
72 year old patient coming in from Dr. Carolee Rota office for acquired pneumonia. Outpatient x-ray is positive for bilateral patchy opacities. Patient's vital signs are stable.Med-surg bed requested, have asked that Dr Renne Crigler send over latest labs, CXR and his last note.He has seen the patient today

## 2012-10-18 DIAGNOSIS — F3289 Other specified depressive episodes: Secondary | ICD-10-CM

## 2012-10-18 DIAGNOSIS — F329 Major depressive disorder, single episode, unspecified: Secondary | ICD-10-CM

## 2012-10-18 LAB — BASIC METABOLIC PANEL
BUN: 14 mg/dL (ref 6–23)
CO2: 23 mEq/L (ref 19–32)
Chloride: 103 mEq/L (ref 96–112)
Creatinine, Ser: 0.8 mg/dL (ref 0.50–1.10)

## 2012-10-18 LAB — URINE CULTURE
Colony Count: NO GROWTH
Special Requests: NORMAL

## 2012-10-18 LAB — EXPECTORATED SPUTUM ASSESSMENT W GRAM STAIN, RFLX TO RESP C: Special Requests: NORMAL

## 2012-10-18 LAB — INFLUENZA PANEL BY PCR (TYPE A & B): Influenza A By PCR: NEGATIVE

## 2012-10-18 LAB — CBC
HCT: 30.2 % — ABNORMAL LOW (ref 36.0–46.0)
MCHC: 34.8 g/dL (ref 30.0–36.0)
MCV: 84.8 fL (ref 78.0–100.0)
RDW: 14.5 % (ref 11.5–15.5)

## 2012-10-18 MED ORDER — POTASSIUM CHLORIDE CRYS ER 20 MEQ PO TBCR
40.0000 meq | EXTENDED_RELEASE_TABLET | Freq: Once | ORAL | Status: AC
  Administered 2012-10-18: 40 meq via ORAL
  Filled 2012-10-18: qty 2

## 2012-10-18 MED ORDER — AZITHROMYCIN 500 MG PO TABS
500.0000 mg | ORAL_TABLET | Freq: Every day | ORAL | Status: AC
  Administered 2012-10-19: 500 mg via ORAL
  Filled 2012-10-18: qty 1

## 2012-10-18 MED ORDER — LEVALBUTEROL HCL 0.63 MG/3ML IN NEBU
0.6300 mg | INHALATION_SOLUTION | Freq: Four times a day (QID) | RESPIRATORY_TRACT | Status: DC | PRN
  Filled 2012-10-18: qty 3

## 2012-10-18 MED ORDER — ACETAMINOPHEN 325 MG PO TABS
650.0000 mg | ORAL_TABLET | Freq: Four times a day (QID) | ORAL | Status: DC | PRN
  Administered 2012-10-18 – 2012-10-20 (×2): 650 mg via ORAL
  Filled 2012-10-18 (×2): qty 2

## 2012-10-18 MED ORDER — SERTRALINE HCL 100 MG PO TABS
200.0000 mg | ORAL_TABLET | Freq: Every day | ORAL | Status: DC
  Administered 2012-10-19 – 2012-10-20 (×2): 200 mg via ORAL
  Filled 2012-10-18 (×2): qty 2

## 2012-10-18 NOTE — Progress Notes (Signed)
UR completed. Darrell Hauk RNBSN 

## 2012-10-18 NOTE — Progress Notes (Addendum)
[facility] PROGRESS NOTE  Dana Pruitt ZOX:[id number] DOB: [birth year] DOA: 10/17/2012 PCP: Dana Moh, MD  Assessment/Plan  CAP (community acquired pneumonia) With acute respiratory failure with hypoxia: O2 sats 87% on RA, improved to 95% on 2L.   Some improvement in dyspnea, but still has oxygen requirement.  If no improvement, consider alternative diagnoses such as IPF as she has had progressive symptoms for several weeks and without high spiking fevers or leukocytosis.   - Flu PCR neg - Legionella antigen pending - Strep antigen neg - Continue ceftriaxone and azithromycin - Continue Xopenex nebs, O2, antitussives  Chronic intermittent back pain -  Tylenol prn  IDC, Right breast,Stage II, Receptor positive- continue Femara   Depression- continue sertraline   Dehydration, resolved and eating and drinking well.   Hypokalemia, likely related to dehydration  -  Oral potassium Normocytic anemia, likely marrow suppression from acute illness.  Repeat as outpatient.     Debility.  Hopefully will improve next 24-48 hours, PT eval if needed  Diet:  Regular  Access:  PIV IVF:  OFF Proph:  lovenox  Code Status: full code Family Communication: spoke with patient alone Disposition Plan: pending improvement in SOB, weaned from oxygen    Consultants:  none  Procedures:  none  Antibiotics:  Ceftriaxone 4/28 >>  Azithromycin 4/28 >>   HPI/Subjective:  Denies fevers, chills, headache.  Denies chest pain but still feels shortness of breath.  Feels somewhat less fatigued today.  Denies nausea, vomiting, diarrhea, constipation.  Has been eating well.  Has some low back pain.    Objective: Filed Vitals:   10/18/12 0516 10/18/12 0937 10/18/12 1432 10/18/12 1534  BP: 143/69  142/71   Pulse: 80  93   Temp: 97.5 F (36.4 C)  98.9 F (37.2 C)   TempSrc: Oral  Oral   Resp: 18  18   Height:      Weight:      SpO2: 91% 93% 97% 96%    Intake/Output  Summary (Last 24 hours) at 10/18/12 1556 Last data filed at 10/18/12 1444  Gross per 24 hour  Intake   1610 ml  Output    950 ml  Net    660 ml   Filed Weights   10/17/12 1255  Weight: 49.442 kg (109 lb)    Exam:   General:  CF, No acute distress until she starts to talk, at which time she develops tachypnea with some SCM retractions and occasionally needs to pause to purse-lip breath  HEENT:  NCAT, MMM  Cardiovascular:  RRR, nl S1, S2 no mrg, 2+ pulses, warm extremities  Respiratory:  Bronchial BS with course rales bilateral bases to the mid back and also heard anteriorly.  No rhonchi or wheeze  Abdomen:  NABS, soft, NT/ND  MSK:   Normal tone and bulk, no LEE  Neuro:  Grossly intact  Data Reviewed: Basic Metabolic Panel:  Recent Labs Lab 10/17/12 1633 10/18/12 0555  NA 131* 137  K 3.6 3.4*  CL 95* 103  CO2 22 23  GLUCOSE 195* 105*  BUN 16 14  CREATININE 0.84 0.80  CALCIUM 9.1 8.7   Liver Function Tests: No results found for this basename: AST, ALT, ALKPHOS, BILITOT, PROT, ALBUMIN,  in the last 168 hours No results found for this basename: LIPASE, AMYLASE,  in the last 168 hours No results found for this basename: AMMONIA,  in the last 168 hours CBC:  Recent Labs Lab 10/17/12 1633 10/18/12 0555  WBC  7.9 5.6  HGB 11.0* 10.5*  HCT 32.1* 30.2*  MCV 84.0 84.8  PLT 218 185   Cardiac Enzymes: No results found for this basename: CKTOTAL, CKMB, CKMBINDEX, TROPONINI,  in the last 168 hours BNP (last 3 results) No results found for this basename: PROBNP,  in the last 8760 hours CBG: No results found for this basename: GLUCAP,  in the last 168 hours  Recent Results (from the past 240 hour(s))  CULTURE, BLOOD (ROUTINE X 2)     Status: None   Collection Time    10/17/12  4:14 PM      Result Value Range Status   Specimen Description BLOOD LEFT FOREARM   Final   Special Requests BOTTLES DRAWN AEROBIC AND ANAEROBIC 10CC   Final   Culture  Setup Time  10/17/2012 22:35   Final   Culture     Final   Value:        BLOOD CULTURE RECEIVED NO GROWTH TO DATE CULTURE WILL BE HELD FOR 5 DAYS BEFORE ISSUING A FINAL NEGATIVE REPORT   Report Status PENDING   Incomplete  CULTURE, BLOOD (ROUTINE X 2)     Status: None   Collection Time    10/17/12  4:33 PM      Result Value Range Status   Specimen Description BLOOD LEFT UPPER WRIST   Final   Special Requests BOTTLES DRAWN AEROBIC AND ANAEROBIC 10CC   Final   Culture  Setup Time 10/17/2012 22:35   Final   Culture     Final   Value:        BLOOD CULTURE RECEIVED NO GROWTH TO DATE CULTURE WILL BE HELD FOR 5 DAYS BEFORE ISSUING A FINAL NEGATIVE REPORT   Report Status PENDING   Incomplete  CULTURE, EXPECTORATED SPUTUM-ASSESSMENT     Status: None   Collection Time    10/18/12 11:33 AM      Result Value Range Status   Specimen Description SPUTUM   Final   Special Requests Normal   Final   Sputum evaluation     Final   Value: THIS SPECIMEN IS ACCEPTABLE. RESPIRATORY CULTURE REPORT TO FOLLOW.   Report Status 10/18/2012 FINAL   Final     Studies: No results found.  Scheduled Meds: . azithromycin  500 mg Intravenous Q24H  . cefTRIAXone (ROCEPHIN)  IV  1 g Intravenous Q24H  . dextromethorphan-guaiFENesin  1 tablet Oral BID  . enoxaparin (LOVENOX) injection  40 mg Subcutaneous Q24H  . letrozole  2.5 mg Oral Daily  . levalbuterol  0.63 mg Nebulization Q6H  . loratadine  10 mg Oral Daily  . pantoprazole  40 mg Oral Q0600  . sertraline  100 mg Oral Daily   Continuous Infusions: . sodium chloride 75 mL/hr at 10/18/12 0524    Principal Problem:   CAP (community acquired pneumonia) Active Problems:   IDC, Right breast,Stage II, Receptor positive   Acute respiratory failure with hypoxia   Depression   Dehydration   Debility    Time spent: 30 min    Dana Pruitt, [facility]  [facility] Pager [phone]. If 7PM-7AM, please contact night-coverage at [email or web address], password [facility] 10/18/2012,  3:56 PM  LOS: 1 day

## 2012-10-19 ENCOUNTER — Inpatient Hospital Stay (HOSPITAL_COMMUNITY): Payer: Medicare Other

## 2012-10-19 LAB — LEGIONELLA ANTIGEN, URINE

## 2012-10-19 MED ORDER — ACYCLOVIR 400 MG PO TABS
400.0000 mg | ORAL_TABLET | Freq: Every day | ORAL | Status: DC
  Filled 2012-10-19 (×3): qty 1

## 2012-10-19 MED ORDER — POTASSIUM CHLORIDE CRYS ER 20 MEQ PO TBCR: 20.0000 meq | EXTENDED_RELEASE_TABLET | Freq: Two times a day (BID) | ORAL | Status: DC

## 2012-10-19 MED ORDER — ACYCLOVIR 200 MG PO CAPS
400.0000 mg | ORAL_CAPSULE | Freq: Every day | ORAL | Status: DC
  Administered 2012-10-19 – 2012-10-20 (×5): 400 mg via ORAL
  Filled 2012-10-19 (×6): qty 2

## 2012-10-19 NOTE — Progress Notes (Signed)
Oxygen saturation on room air while: Ambulating - 96% Sitting - 84% Lying- 84% Dana Pruitt  10/19/2012

## 2012-10-19 NOTE — Progress Notes (Signed)
[facility] PROGRESS NOTE  Dana Pruitt ZOX:[id number] DOB: 1940-07-12 DOA: 10/17/2012 PCP: Londell Moh, MD  Assessment/Plan: 1-CAP (community acquired pneumonia) With acute respiratory failure with hypoxia:- Flu PCR neg  - Legionella antigen pending  - Strep antigen neg  -Sputum culture pending.  - Continue ceftriaxone and azithromycin day 3. - Continue Xopenex nebs, O2, antitussives  -Evaluate for home oxygen requirement.  -Repeat Chest X ray.   2-IDC, Right breast,Stage II, Receptor positive- continue Femara  3-Depression- continue sertraline  4-Hypokalemia - replete.  5-Normocytic anemia, Work up  as outpatient.     Code Status: Full Family Communication: Care discussed with Patient.  Disposition Plan: Home probably 5-1   Consultants:  none  Procedures:  none  Antibiotics:  Ceftriaxone 4-28  Azithromycin 4-28  HPI/Subjective: Still coughing. Breathing better.   Objective: Filed Vitals:   10/18/12 1946 10/18/12 2113 10/19/12 0500 10/19/12 0522  BP:  156/76 150/69   Pulse:  83  83  Temp:  97.4 F (36.3 C)  98.3 F (36.8 C)  TempSrc:  Oral  Oral  Resp:  16  17  Height:      Weight:      SpO2: 95% 94% 97%     Intake/Output Summary (Last 24 hours) at 10/19/12 0753 Last data filed at 10/18/12 1854  Gross per 24 hour  Intake   1430 ml  Output    350 ml  Net   1080 ml   Filed Weights   10/17/12 1255  Weight: 49.442 kg (109 lb)    Exam:   General:  No distress.   Cardiovascular: S 1, S 2 RRR  Respiratory: No wheezes, fine crackles.  Abdomen: BS present, soft, NT  Musculoskeletal: No edema.  Data Reviewed: Basic Metabolic Panel:  Recent Labs Lab 10/17/12 1633 10/18/12 0555  NA 131* 137  K 3.6 3.4*  CL 95* 103  CO2 22 23  GLUCOSE 195* 105*  BUN 16 14  CREATININE 0.84 0.80  CALCIUM 9.1 8.7   Liver Function Tests: No results found for this basename: AST, ALT, ALKPHOS, BILITOT, PROT, ALBUMIN,  in the last  168 hours No results found for this basename: LIPASE, AMYLASE,  in the last 168 hours No results found for this basename: AMMONIA,  in the last 168 hours CBC:  Recent Labs Lab 10/17/12 1633 10/18/12 0555  WBC 7.9 5.6  HGB 11.0* 10.5*  HCT 32.1* 30.2*  MCV 84.0 84.8  PLT 218 185   Cardiac Enzymes: No results found for this basename: CKTOTAL, CKMB, CKMBINDEX, TROPONINI,  in the last 168 hours BNP (last 3 results) No results found for this basename: PROBNP,  in the last 8760 hours CBG: No results found for this basename: GLUCAP,  in the last 168 hours  Recent Results (from the past 240 hour(s))  CULTURE, BLOOD (ROUTINE X 2)     Status: None   Collection Time    10/17/12  4:14 PM      Result Value Range Status   Specimen Description BLOOD LEFT FOREARM   Final   Special Requests BOTTLES DRAWN AEROBIC AND ANAEROBIC 10CC   Final   Culture  Setup Time 10/17/2012 22:35   Final   Culture     Final   Value:        BLOOD CULTURE RECEIVED NO GROWTH TO DATE CULTURE WILL BE HELD FOR 5 DAYS BEFORE ISSUING A FINAL NEGATIVE REPORT   Report Status PENDING   Incomplete  CULTURE, BLOOD (ROUTINE X 2)  Status: None   Collection Time    10/17/12  4:33 PM      Result Value Range Status   Specimen Description BLOOD LEFT UPPER WRIST   Final   Special Requests BOTTLES DRAWN AEROBIC AND ANAEROBIC 10CC   Final   Culture  Setup Time 10/17/2012 22:35   Final   Culture     Final   Value:        BLOOD CULTURE RECEIVED NO GROWTH TO DATE CULTURE WILL BE HELD FOR 5 DAYS BEFORE ISSUING A FINAL NEGATIVE REPORT   Report Status PENDING   Incomplete  URINE CULTURE     Status: None   Collection Time    10/17/12  5:41 PM      Result Value Range Status   Specimen Description URINE, RANDOM   Final   Special Requests Normal   Final   Culture  Setup Time 10/17/2012 23:22   Final   Colony Count NO GROWTH   Final   Culture NO GROWTH   Final   Report Status 10/18/2012 FINAL   Final  CULTURE, EXPECTORATED  SPUTUM-ASSESSMENT     Status: None   Collection Time    10/18/12 11:33 AM      Result Value Range Status   Specimen Description SPUTUM   Final   Special Requests Normal   Final   Sputum evaluation     Final   Value: THIS SPECIMEN IS ACCEPTABLE. RESPIRATORY CULTURE REPORT TO FOLLOW.   Report Status 10/18/2012 FINAL   Final     Studies: No results found.  Scheduled Meds: . azithromycin  500 mg Oral Daily  . cefTRIAXone (ROCEPHIN)  IV  1 g Intravenous Q24H  . dextromethorphan-guaiFENesin  1 tablet Oral BID  . enoxaparin (LOVENOX) injection  40 mg Subcutaneous Q24H  . letrozole  2.5 mg Oral Daily  . loratadine  10 mg Oral Daily  . pantoprazole  40 mg Oral Q0600  . sertraline  200 mg Oral Daily   Continuous Infusions:   Principal Problem:   CAP (community acquired pneumonia) Active Problems:   IDC, Right breast,Stage II, Receptor positive   Acute respiratory failure with hypoxia   Depression   Dehydration   Debility    Time spent: 35 minutes    Dana Pruitt  [facility] Pager [phone]. If 7PM-7AM, please contact night-coverage at [email or web address], password [facility] 10/19/2012, 7:53 AM  LOS: 2 days

## 2012-10-20 LAB — CULTURE, RESPIRATORY W GRAM STAIN: Culture: NORMAL

## 2012-10-20 LAB — BASIC METABOLIC PANEL
Calcium: 8.8 mg/dL (ref 8.4–10.5)
GFR calc non Af Amer: 84 mL/min — ABNORMAL LOW (ref >90)
Sodium: 137 mEq/L (ref 135–145)

## 2012-10-20 MED ORDER — GUAIFENESIN-CODEINE 100-10 MG/5ML PO SOLN: 5.0000 mL | Freq: Three times a day (TID) | ORAL | Status: DC | PRN

## 2012-10-20 MED ORDER — ALBUTEROL SULFATE HFA 108 (90 BASE) MCG/ACT IN AERS: 2.0000 | INHALATION_SPRAY | Freq: Four times a day (QID) | RESPIRATORY_TRACT | Status: DC | PRN

## 2012-10-20 MED ORDER — ACYCLOVIR 200 MG PO CAPS: 400.0000 mg | ORAL_CAPSULE | Freq: Every day | ORAL | Status: DC

## 2012-10-20 MED ORDER — LORATADINE 10 MG PO TABS: 10.0000 mg | ORAL_TABLET | Freq: Every day | ORAL | Status: DC

## 2012-10-20 MED ORDER — LEVOFLOXACIN 750 MG PO TABS: 750.0000 mg | ORAL_TABLET | Freq: Every day | ORAL | Status: DC

## 2012-10-20 NOTE — Progress Notes (Signed)
D/c instructions reviewed with pt. Copy of instructions and scripts given to pt. Pt d/c via wheelchair with belongings escorted by hospital volunteer. Husband at entrance to pick up.

## 2012-10-20 NOTE — Progress Notes (Signed)
Discussed O2 sats with Dr Sunnie Nielsen, pt will not need oxygen for home use.

## 2012-10-20 NOTE — Discharge Summary (Signed)
Physician Discharge Summary  Dana Pruitt:[id number] DOB: [birth year] DOA: 10/17/2012  PCP: Londell Moh, MD  Admit date: 10/17/2012 Discharge date: 10/20/2012  Time spent: 35 minutes  Recommendations for Outpatient Follow-up:  1. Needs to follow up with PCP to reassess improvement of PNA. Check oxygen saturation and requirement of oxygen. 2. Needs referral to Pulmonology st for PFT, evaluation emphysema.  3. Needs repeat Chest x ray for confirmation of clearing lung infiltrates.   Discharge Diagnoses:    CAP (community acquired pneumonia)   IDC, Right breast,Stage II, Receptor positive   Acute respiratory failure with hypoxia   Depression   Dehydration   Debility   Discharge Condition: Stable.   Diet recommendation: Heart Healthy  Filed Weights   10/17/12 1255  Weight: 49.442 kg (109 lb)    History of present illness:  Patient is a 72 year old female with history of depression and breast cancer went to her PCPs office with shortness of breath and chills worsening in the last 2 days. Patient reported 2 weeks of runny nose with productive cough with yellowish phlegm. Per patient, she has been visiting her mother in assisted living facility and thinks she may have "caught something from there". She initially thought it was "allergies" but then over the last 2 days, she developed chills and shortness of breath, worse with exertion. She went to her PCPs office today, chest x-ray was done which showed bilateral pneumonia worse on the left with hypoxia. Patient was sent as a direct admit. Her O2 sats were 87% on room air. She does not use any oxygen at home.    Hospital Course:  Patient admitted with hypoxemia, cough. Chest x ray with bilateral infiltrates. She was treated for Community acquired PNA with IV Ceftriaxone and Azithromycin  for 3 days. I will provide prescription for Levaquin for 5 more day. She is still requiring Oxygen 2 L. She is feeling better, cough has  improved and denies dyspnea. Repeat Chest x ray sow stable infiltrates.She will benefit of PFT and pulmonologist evaluation out patient. She will need Chest x ray to document resolution of infiltrates.   1-CAP (community acquired pneumonia) With acute respiratory failure with hypoxia:- Flu PCR neg  - Legionella antigen negative. - Strep antigen neg  -Sputum culture normal oropharyngeal flora.  -  ceftriaxone and azithromycin day 3.  - Continue Xopenex nebs, O2, antitussives  -Evaluate for home oxygen requirement.  2-IDC, Right breast,Stage II, Receptor positive- continue Femara  3-Depression- continue sertraline  4-Hypokalemia  - replete.  5-Normocytic anemia, Work up as outpatient.    Procedures:  None  Consultations:  None  Discharge Exam: Filed Vitals:   10/19/12 0522 10/19/12 1411 10/19/12 2120 10/20/12 0527  BP:  130/68 149/70 146/56  Pulse: 83 89 86 83  Temp: 98.3 F (36.8 C) 97.9 F (36.6 C) 97.9 F (36.6 C) 97.9 F (36.6 C)  TempSrc: Oral Oral Oral Oral  Resp: 17 20 18 16   Height:      Weight:      SpO2:  98% 95% 95%    General: No distress.  Cardiovascular: S 1, S 2 RRR Respiratory: Few crackles bases.   Discharge Instructions  Discharge Orders   Future Appointments Provider Department Dept Phone   11/16/2012 10:00 AM Keitha Butte, NP [facility] [phone]   Future Orders Complete By Expires     Diet - low sodium heart healthy  As directed     Increase activity slowly  As  directed         Medication List    TAKE these medications       acyclovir 200 MG capsule  Commonly known as:  ZOVIRAX  Take 2 capsules (400 mg total) by mouth 5 (five) times daily.     albuterol 108 (90 BASE) MCG/ACT inhaler  Commonly known as:  PROVENTIL HFA;VENTOLIN HFA  Inhale 2 puffs into the lungs every 6 (six) hours as needed for wheezing.     aspirin 81 MG tablet  Take 81 mg by mouth every morning.     CALCIUM 500 + D  500-125 MG-UNIT Tabs  Generic drug:  Calcium Carbonate-Vitamin D  Take 1 tablet by mouth 2 (two) times daily.     guaiFENesin-codeine 100-10 MG/5ML syrup  Take 5 mLs by mouth every 8 (eight) hours as needed for cough.     letrozole 2.5 MG tablet  Commonly known as:  FEMARA  Take 1 tablet (2.5 mg total) by mouth daily.     levofloxacin 750 MG tablet  Commonly known as:  LEVAQUIN  Take 1 tablet (750 mg total) by mouth daily.     loratadine 10 MG tablet  Commonly known as:  CLARITIN  Take 1 tablet (10 mg total) by mouth daily.     PREVACID 24HR PO  Take 1 capsule by mouth every morning.     sertraline 100 MG tablet  Commonly known as:  ZOLOFT  Take 200 mg by mouth every morning.     SIMPLY SLEEP PO  Take 2 tablets by mouth at bedtime.     VITAMIN D PO  Take 1 tablet by mouth every morning.           Follow-up Information   Follow up with Londell Moh, MD. Call in 5 days.   Contact information:   [address] [place] [address] [phone]        The results of significant diagnostics from this hospitalization (including imaging, microbiology, ancillary and laboratory) are listed below for reference.    Significant Diagnostic Studies: Dg Chest 2 View  10/19/2012  *RADIOLOGY REPORT*  Clinical Data: Follow-up pneumonia.  Shortness of breath.  CHEST - 2 VIEW  Comparison: 10/17/2012  Findings: There is hyperinflation of the lungs compatible with COPD.  Heart is normal size.  Patchy bilateral airspace opacities are again noted, most pronounced in the upper lobes but also noted in the lung bases.  This is not significantly changed since recent study.  No pleural effusions.  No acute bony abnormality.  IMPRESSION: Stable patchy bilateral airspace disease superimposed on emphysema. No real change.   Original Report Authenticated By: Charlett Nose, M.D.     Microbiology: Recent Results (from the past 240 hour(s))  CULTURE, BLOOD (ROUTINE X 2)      Status: None   Collection Time    10/17/12  4:14 PM      Result Value Range Status   Specimen Description BLOOD LEFT FOREARM   Final   Special Requests BOTTLES DRAWN AEROBIC AND ANAEROBIC 10CC   Final   Culture  Setup Time 10/17/2012 22:35   Final   Culture     Final   Value:        BLOOD CULTURE RECEIVED NO GROWTH TO DATE CULTURE WILL BE HELD FOR 5 DAYS BEFORE ISSUING A FINAL NEGATIVE REPORT   Report Status PENDING   Incomplete  CULTURE, BLOOD (ROUTINE X 2)     Status: None   Collection Time  10/17/12  4:33 PM      Result Value Range Status   Specimen Description BLOOD LEFT UPPER WRIST   Final   Special Requests BOTTLES DRAWN AEROBIC AND ANAEROBIC 10CC   Final   Culture  Setup Time 10/17/2012 22:35   Final   Culture     Final   Value:        BLOOD CULTURE RECEIVED NO GROWTH TO DATE CULTURE WILL BE HELD FOR 5 DAYS BEFORE ISSUING A FINAL NEGATIVE REPORT   Report Status PENDING   Incomplete  URINE CULTURE     Status: None   Collection Time    10/17/12  5:41 PM      Result Value Range Status   Specimen Description URINE, RANDOM   Final   Special Requests Normal   Final   Culture  Setup Time 10/17/2012 23:22   Final   Colony Count NO GROWTH   Final   Culture NO GROWTH   Final   Report Status 10/18/2012 FINAL   Final  CULTURE, EXPECTORATED SPUTUM-ASSESSMENT     Status: None   Collection Time    10/18/12 11:33 AM      Result Value Range Status   Specimen Description SPUTUM   Final   Special Requests Normal   Final   Sputum evaluation     Final   Value: THIS SPECIMEN IS ACCEPTABLE. RESPIRATORY CULTURE REPORT TO FOLLOW.   Report Status 10/18/2012 FINAL   Final  CULTURE, RESPIRATORY (NON-EXPECTORATED)     Status: None   Collection Time    10/18/12 11:33 AM      Result Value Range Status   Specimen Description SPUTUM   Final   Special Requests NONE   Final   Gram Stain     Final   Value: ABUNDANT WBC PRESENT, PREDOMINANTLY PMN     NO SQUAMOUS EPITHELIAL CELLS SEEN     NO  ORGANISMS SEEN   Culture NORMAL OROPHARYNGEAL FLORA   Final   Report Status 10/20/2012 FINAL   Final     Labs: Basic Metabolic Panel:  Recent Labs Lab 10/17/12 1633 10/18/12 0555 10/20/12 0600  NA 131* 137 137  K 3.6 3.4* 4.0  CL 95* 103 104  CO2 22 23 25   GLUCOSE 195* 105* 104*  BUN 16 14 9   CREATININE 0.84 0.80 0.75  CALCIUM 9.1 8.7 8.8   CBC:  Recent Labs Lab 10/17/12 1633 10/18/12 0555  WBC 7.9 5.6  HGB 11.0* 10.5*  HCT 32.1* 30.2*  MCV 84.0 84.8  PLT 218 185   Cardiac Enzymes: No results found for this basename: CKTOTAL, CKMB, CKMBINDEX, TROPONINI,  in the last 168 hours BNP: BNP (last 3 results) No results found for this basename: PROBNP,  in the last 8760 hours CBG: No results found for this basename: GLUCAP,  in the last 168 hours     Signed:  Brigett Estell  [facility] 10/20/2012, 10:10 AM

## 2012-10-20 NOTE — Progress Notes (Signed)
Note previous entry of pt sats on RA at rest and ambulating. O2 left off at this time.   Rechecked RA sat again at this time 1200, sat 94% RA at rest. Call placed to Dr Sunnie Nielsen about sats and need for home O2 or not.

## 2012-10-20 NOTE — Progress Notes (Signed)
SATURATION QUALIFICATIONS: (This note is used to comply with regulatory documentation for home oxygen)  Patient Saturations on Room Air at Rest = 92-94%  Patient Saturations on Room Air while Ambulating = 88-90%  Patient Saturations on NA Liters of oxygen while Ambulating = NA%  Please briefly explain why patient needs home oxygen: will notify case manager and MD, to reassess need

## 2012-10-23 LAB — CULTURE, BLOOD (ROUTINE X 2): Culture: NO GROWTH

## 2012-11-09 ENCOUNTER — Other Ambulatory Visit: Payer: Medicare Other | Admitting: Lab

## 2012-11-16 ENCOUNTER — Encounter: Payer: Self-pay | Admitting: Family

## 2012-11-16 ENCOUNTER — Ambulatory Visit: Payer: Medicare Other | Admitting: Oncology

## 2012-11-16 ENCOUNTER — Ambulatory Visit (HOSPITAL_BASED_OUTPATIENT_CLINIC_OR_DEPARTMENT_OTHER): Payer: Medicare Other | Admitting: Family

## 2012-11-16 ENCOUNTER — Other Ambulatory Visit: Payer: Self-pay | Admitting: Physician Assistant

## 2012-11-16 ENCOUNTER — Ambulatory Visit (HOSPITAL_BASED_OUTPATIENT_CLINIC_OR_DEPARTMENT_OTHER): Payer: Medicare Other

## 2012-11-16 ENCOUNTER — Telehealth: Payer: Self-pay | Admitting: *Deleted

## 2012-11-16 VITALS — BP 135/76 | HR 80 | Temp 97.9°F | Resp 20 | Ht 64.0 in | Wt 117.7 lb

## 2012-11-16 DIAGNOSIS — M899 Disorder of bone, unspecified: Secondary | ICD-10-CM

## 2012-11-16 DIAGNOSIS — E559 Vitamin D deficiency, unspecified: Secondary | ICD-10-CM

## 2012-11-16 DIAGNOSIS — C801 Malignant (primary) neoplasm, unspecified: Secondary | ICD-10-CM

## 2012-11-16 DIAGNOSIS — Z853 Personal history of malignant neoplasm of breast: Secondary | ICD-10-CM

## 2012-11-16 DIAGNOSIS — Z170 Estrogen receptor positive status [ER+]: Secondary | ICD-10-CM

## 2012-11-16 DIAGNOSIS — M949 Disorder of cartilage, unspecified: Secondary | ICD-10-CM

## 2012-11-16 DIAGNOSIS — C50919 Malignant neoplasm of unspecified site of unspecified female breast: Secondary | ICD-10-CM

## 2012-11-16 MED ORDER — LETROZOLE 2.5 MG PO TABS: 2.5000 mg | ORAL_TABLET | Freq: Every day | ORAL | Status: DC

## 2012-11-16 MED ORDER — DENOSUMAB 60 MG/ML ~~LOC~~ SOLN
60.0000 mg | Freq: Once | SUBCUTANEOUS | Status: AC
  Administered 2012-11-16: 60 mg via SUBCUTANEOUS
  Filled 2012-11-16: qty 1

## 2012-11-16 NOTE — Patient Instructions (Addendum)
Please contact us at [phone] if you have any questions or concerns.  Please continue to do well and enjoy life!!!  Get plenty of rest, drink plenty of water, exercise daily (walking), eat a balanced diet, avoid stress, wear sun protection  Continue to take calcium and vitamin D3 daily.   We will see you in May 2015, see Dr. Jamey Ripa in December 2014

## 2012-11-16 NOTE — Progress Notes (Signed)
[facility]  [phone] [phone]) [phone]  OFFICE PROGRESS NOTE   ID: Dana Pruitt   DOB: 07-29-1940  MR#: [id number]  QMV#:[id number]   PCP: Dana Moh, MD GYN:  Dana Pruitt, M.D. Dana Pruitt:XLKGM Dana Pruitt, M.D.   HISTORY OF PRESENT ILLNESS: From Dana Pruitt's the patient evaluation note dated 03/06/2010: "This is a delightful 72 year old woman here today with her husband, Dana Pruitt, for evaluation and treatment of recent diagnosis of breast cancer.  This woman has been in good health.  She gets annual screening mammography up until two years ago.  She scheduled mammogram and about two days later felt a mass in the superior portion of the right breast.  She sought medical attention for this.  A mammogram and ultrasound performed on 02/25/2010 showed a mass measuring about 2.5 x 2.8 x 2.0 cm, right axillary lymph node with a fatty hilum was also seen.  Mammography showed this mass to be about 3.0 x 2.5 x 2.0 cm.  Biopsy was recommended, which was performed on 02/27/2010.  This showed intermediate-grade carcinoma, ER/PR positive 191% respectively, proliferative index 22%, HER-2 was not amplified with a ratio of 1.29.  She has been referred to see Dana Pruitt and has seen [place] today for consideration of neoadjuvant therapy.  She is due for an MRI scan this coming Friday."  Her subsequent history is as detailed below.   INTERVAL HISTORY: Dana Pruitt and I saw Dana Pruitt today for follow up of invasive ductal carcinoma of the right breast. The patient was last seen by Dana Pruitt on 04/07/2012.  Since her last office visit, the patient has been doing relatively well.  She is establishing herself with Dana Pruitt service today.   REVIEW OF SYSTEMS: A 10 point review of systems was completed and is negative.  The patient denies any symptomatology. The patient did mention she's under a tremendous amount of stress taking care of a 5 year old mother  who lives in a nursing facility.  The patient is also recovering from her recent episode of pneumonia.  PAST MEDICAL HISTORY: Past Medical History  Diagnosis Date  . Anxiety   . Exertional shortness of breath     "recently" (10/17/2012)  . Anemia     "several times over the years" (10/17/2012)  . Carcinoma of breast treated with adjuvant hormone therapy 9/11    Femara  . Breast cancer 02/2010    right breast  . 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 Pneumonia     "that's why I'm here" (10/17/2012)  She has a history of mild depression.   PAST SURGICAL HISTORY: Past Surgical History  Procedure Laterality Date  . Cesarean section  1970; 1974  . Appendectomy  1974  . Dilation and curettage of uterus  1970's  . Colonoscopy    . Breast lumpectomy with sentinel lymph node biopsy Right 07/03/11    Invasive Ductal Carcinoma;0/3 nodes negative,; ER,PR Positive, Her-2 Neg; Ki-67 22%  . Breast biopsy Right 02/27/10    Needle core Biopsy; Invasive Mammary; ER/PR Positive, Her-2 Neu negative, Ki-67 22%  . Tubal ligation  1974    FAMILY HISTORY Family History  Problem Relation Age of Onset  . Cancer Neg Hx   . Heart disease Mother   . Heart disease Maternal Grandmother   . Heart disease Maternal Grandfather   The patient's father is deceased and her mother is alive and lives in a nursing facility.  She has one sister who is  alive and well living in [place].     GYNECOLOGIC HISTORY: G2, P2.  Menarche at age 79.  Menopause in her 2s.  She is status post two C-sections and has not had any hormonal replacement therapy.  There is no other family history of breast cancer in the family   SOCIAL HISTORY: The patient has been married to her husband Dana Pruitt since 1968.  Her husband is a retired Careers adviser.  They live in [place]. They have two adult sons, one of her sons lives in [place] and her other son lives in [place].  She has two grandchildren.  In her  spare time, she enjoys spending time with her friends and family , traveling, going to the beach, and gardening.   ADVANCED DIRECTIVES:  Not on file  HEALTH MAINTENANCE: History  Substance Use Topics  . Smoking status: Former Smoker -- 2.00 packs/day for 25 years    Types: Cigarettes    Quit date: 06/30/1990  . Smokeless tobacco: Never Used  . Alcohol Use: 0.6 oz/week    1 Glasses of wine per week     Comment: 10/17/2012 "3-4 glasses of wine/month"    Colonoscopy:  Not on file PAP:  02/21/2010 Bone density:  The patient states that she's had a bone density scan within the last 2 years at [facility] - We do not have a copy of her bone density scan results. Lipid panel:  Not on file  No Known Allergies  Current Outpatient Prescriptions  Medication Sig Dispense Refill  . acyclovir (ZOVIRAX) 200 MG capsule Take 2 capsules (400 mg total) by mouth 5 (five) times daily.  20 capsule  0  . Calcium Carbonate-Vitamin D (CALCIUM 500 + D) 500-125 MG-UNIT TABS Take 1 tablet by mouth 2 (two) times daily.       . Cholecalciferol (VITAMIN D PO) Take 1 tablet by mouth daily. 1000 IUs daily      . DiphenhydrAMINE HCl, Sleep, (SIMPLY SLEEP PO) Take 2 tablets by mouth at bedtime.       . Lansoprazole (PREVACID 24HR PO) Take 1 capsule by mouth every morning.      [name] letrozole (FEMARA) 2.5 MG tablet Take 1 tablet (2.5 mg total) by mouth daily.  90 tablet  5  . loratadine (CLARITIN) 10 MG tablet Take 1 tablet (10 mg total) by mouth daily.  30 tablet  0  . sertraline (ZOLOFT) 100 MG tablet Take 200 mg by mouth every morning.       No current facility-administered medications for this visit.    OBJECTIVE: Filed Vitals:   11/16/12 1006  BP: 135/76  Pulse: 80  Temp: 97.9 F (36.6 C)  Resp: 20     Body mass index is 20.19 kg/(m^2).      ECOG FS: 0 - Asymptomatic  General appearance: Alert, cooperative, well nourished, thin frame, no apparent distress Head: Normocephalic, without obvious  abnormality, atraumatic Eyes: Arcus senilis, PERRLA, EOMI Nose: Nares, septum and mucosa are normal, no drainage or sinus tenderness Neck: No adenopathy, supple, symmetrical, trachea midline, thyroid not enlarged, no tenderness Resp: Clear to auscultation bilaterally Cardio: Regular rate and rhythm, S1, S2 normal, no murmur, click, rub or gallop Breasts: right breast has well-healed surgical scars, no lymphadenopathy, no nipple inversion, no axilla fullness GI: Soft, not distended, non-tender, normoactive bowel sounds, no organomegaly, well-healed surgical scar on abdominal area Skin: Seborrheic keratosis on trunk area Extremities: Extremities normal, atraumatic, no cyanosis or edema Lymph nodes:  Cervical, supraclavicular, and axillary nodes normal Neurologic: Grossly normal, anxious at times  LAB RESULTS: Lab Results  Component Value Date   WBC 5.6 10/18/2012   NEUTROABS 3.8 03/31/2012   HGB 10.5* 10/18/2012   HCT 30.2* 10/18/2012   MCV 84.8 10/18/2012   PLT 185 10/18/2012      Chemistry      Component Value Date/Time   NA 137 10/20/2012 0600   NA 138 03/31/2012 1410   K 4.0 10/20/2012 0600   K 4.6 03/31/2012 1410   CL 104 10/20/2012 0600   CL 105 03/31/2012 1410   CO2 25 10/20/2012 0600   CO2 23 03/31/2012 1410   BUN 9 10/20/2012 0600   BUN 17.0 03/31/2012 1410   CREATININE 0.75 10/20/2012 0600   CREATININE 1.2* 03/31/2012 1410      Component Value Date/Time   CALCIUM 8.8 10/20/2012 0600   CALCIUM 10.3 03/31/2012 1410   ALKPHOS 45 03/31/2012 1410   ALKPHOS 36* 08/20/2011 0934   AST 17 03/31/2012 1410   AST 15 08/20/2011 0934   ALT 13 03/31/2012 1410   ALT 13 08/20/2011 0934   BILITOT 0.50 03/31/2012 1410   BILITOT 0.5 08/20/2011 0934      Lab Results  Component Value Date   LABCA2 30 11/12/2010    Urinalysis    Component Value Date/Time   COLORURINE YELLOW 10/17/2012 1738   APPEARANCEUR CLEAR 10/17/2012 1738   LABSPEC 1.015 10/17/2012 1738   PHURINE 6.5 10/17/2012 1738    GLUCOSEU NEGATIVE 10/17/2012 1738   HGBUR NEGATIVE 10/17/2012 1738   BILIRUBINUR NEGATIVE 10/17/2012 1738   KETONESUR NEGATIVE 10/17/2012 1738   PROTEINUR 30* 10/17/2012 1738   UROBILINOGEN 0.2 10/17/2012 1738   NITRITE NEGATIVE 10/17/2012 1738   LEUKOCYTESUR NEGATIVE 10/17/2012 1738    STUDIES: Dg Chest 2 View 10/19/2012   *RADIOLOGY REPORT*  Clinical Data: Follow-up pneumonia.  Shortness of breath.  CHEST - 2 VIEW  Comparison: 10/17/2012  Findings: There is hyperinflation of the lungs compatible with COPD.  Heart is normal size.  Patchy bilateral airspace opacities are again noted, most pronounced in the upper lobes but also noted in the lung bases.  This is not significantly changed since recent study.  No pleural effusions.  No acute bony abnormality.  IMPRESSION: Stable patchy bilateral airspace disease superimposed on emphysema. No real change.   Original Report Authenticated By: Charlett Nose, M.D.    ASSESSMENT: 72 y.o.[place], [place]: 1.  Status post right breast needle core biopsy at the 12 o'clock position on 02/27/2010 which showed invasive mammary carcinoma that appeared to be an intermediate grade invasive ductal carcinoma, ER 100%, PR 91%, Ki-67 22%, HER-2/neu by CISH did not show any amplification with a ratio of 1.29.  2.  Oncotype DX report dated 03/28/2010 showed a breast cancer recurrence score of 13 with average rate of distant recurrence at 8%.   3.  Status post bilateral breast MRI on 03/07/2010 which showed  a 4.2 x 3.5 x 3.3 cm biopsy-proven invasive ductal carcinoma centered in the 12 o'clock position of the right breast.   No evidence of malignancy elsewhere in either breast.   No abnormal appearing lymph nodes.  Mild cardiomegaly. (Stage IIA, T2 N0)  3.  The patient started neoadjuvant Femara in 02/2010.  4.  Status post right breast needle localization lumpectomy with right axillary node sentinel biopsy on 07/03/2011 for a stage IIA, ypT2, ypN0, invasive  ductal carcinoma with associated calcification grade 2 and ductal carcinoma in  situ intermediate grade with lymphovascular invasion identified, ER 100%, PR 91%, Ki-67 22%, HER-2/neu no amplification with a ratio of 1.29,  0/3 metastatic lymph nodes.    5.  The patient underwent radiation therapy from 08/05/2011 through 09/02/2011.    6.  The patient's last bilateral digital diagnostic mammogram on 01/13/2012 showed the breasts are heterogeneously dense which is an independent risk factor for breast cancer in which also decreases mammographic sensitivity.  The breast parenchymal pattern is stable with no new worrisome finding in either breast.  Surgical changes are noted on the right.  Benign findings.    7.  The patient's last bone density scan that we have on record was on 08/12/2005 and showed a T score of -2.4 (osteopenia). The patient states that she has had a bone density scan within the last 2 years at [facility].we have not received a copy of her most recent bone density scan.  8.  The patient receives Prolia injections 60 mg every 6 months for osteopenia.  PLAN: The patient will continue antiestrogen therapy with Letrozole 2.5 mg by mouth daily. An electronic prescription for Letrozole #90 with 5 refills was sent to the patient's pharmacy. The patient is scheduled to continue antiestrogen therapy until 02/2015.  The patient will receive a Prolia injection today and we will continue her every 6 month Prolia injections for osteopenia.  Her next injection is scheduled for 05/19/2013. We will also check a CMP at the time of her Prolia injections. The patient was encouraged to continue taking calcium 1000 mg daily in addition to vitamin D 1000 IUs daily.  We will alternate office visits with the patient's surgeon Dana Pruitt every 6 months.  We will schedule the patient to see Dana Pruitt in 05/2013 and we plan to see her again in 10/2013.  We will check a CBC, CMP, LDH, and vitamin D level  during her next office visit.we will also schedule the patient's annual mammogram in 12/2012  for her.  All questions were answered.  The patient was encouraged to contact us in the interim with any problems, questions or concerns.   Larina Bras, NP-C 11/17/2012, 10:02 AM

## 2012-11-16 NOTE — Telephone Encounter (Signed)
appts made and printed. gv pt appt d/t for [facility] 01/13/13@10 :30am. Pt is aware that Dr.Streck office will call her in Nov. For her Dec. appt because she is on their call list...td

## 2012-11-22 ENCOUNTER — Ambulatory Visit (INDEPENDENT_AMBULATORY_CARE_PROVIDER_SITE_OTHER): Payer: Medicare Other | Admitting: Emergency Medicine

## 2012-11-22 ENCOUNTER — Encounter: Payer: Self-pay | Admitting: Emergency Medicine

## 2012-11-22 VITALS — BP 110/72 | HR 88 | Ht 63.5 in | Wt 117.2 lb

## 2012-11-22 DIAGNOSIS — J449 Chronic obstructive pulmonary disease, unspecified: Secondary | ICD-10-CM | POA: Insufficient documentation

## 2012-11-22 DIAGNOSIS — R0609 Other forms of dyspnea: Secondary | ICD-10-CM

## 2012-11-22 DIAGNOSIS — R0600 Dyspnea, unspecified: Secondary | ICD-10-CM

## 2012-11-22 NOTE — Patient Instructions (Addendum)
Please finish your azithromycin We will perform full pulmonary function testing at your visit.  We will repeat your CXR at that time. Depending on the results we may decide to perform a CT scan of your chest  Follow with Dr Delton Coombes in 1 month with PFT and CXR on the same date

## 2012-11-22 NOTE — Assessment & Plan Note (Signed)
Suspect that she did have LLL CAP based on her follow up CXR done 5/28 that shows residual infiltrate. Also suspect COPD, severity unclear.  - check full PFT - CXR in a month - if CXR shows persistent infiltrate then favor a CT scan to clarify

## 2012-11-22 NOTE — Progress Notes (Signed)
Subjective:    Patient ID: Dana Pruitt, female    DOB: 1940-12-23, 72 y.o.   MRN: [id number]  HPI 72 yo woman, former smoker (, hx breast CA,  Was dx with LLL CAP admitted to [facility], followed by Dr Renne Crigler end of April. Repeat CXR 5/28 at Dr Carolee Rota office > some continued haziness in the LLL. She improved during the hospitalization, but has continued to have exertional SOB. No fever, no cough, no wheezing. She has had some GERD sx.  Hasn't used the SABA that she received at discharge.    Review of Systems  Constitutional: Negative for fever and unexpected weight change.  HENT: Negative for ear pain, nosebleeds, congestion, sore throat, rhinorrhea, sneezing, trouble swallowing, dental problem, postnasal drip and sinus pressure.   Eyes: Negative for redness and itching.  Respiratory: Positive for chest tightness and shortness of breath. Negative for cough and wheezing.   Cardiovascular: Negative for palpitations and leg swelling.  Gastrointestinal: Negative for nausea and vomiting.  Genitourinary: Negative for dysuria.  Musculoskeletal: Negative for joint swelling.  Skin: Negative for rash.  Neurological: Negative for headaches.  Hematological: Does not bruise/bleed easily.  Psychiatric/Behavioral: Negative for dysphoric mood. The patient is nervous/anxious.     Past Medical History  Diagnosis Date  . Anxiety   . Exertional shortness of breath     "recently" (10/17/2012)  . Anemia     "several times over the years" (10/17/2012)  . Carcinoma of breast treated with adjuvant hormone therapy 9/11    Femara  . Breast cancer 02/2010    right breast  . 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 Pneumonia     "that's why I'm here" (10/17/2012)     Family History  Problem Relation Age of Onset  . Cancer Neg Hx   . Heart disease Mother   . Heart disease Maternal Grandmother   . Heart disease Maternal Grandfather      History   Social History  . Marital Status: Married   Spouse Name: N/A    Number of Children: N/A  . Years of Education: N/A   Occupational History  . Not on file.   Social History Main Topics  . Smoking status: Former Smoker -- 2.00 packs/day for 25 years    Types: Cigarettes    Quit date: 06/30/1990  . Smokeless tobacco: Never Used  . Alcohol Use: 0.6 oz/week    1 Glasses of wine per week     Comment: 10/17/2012 "3-4 glasses of wine/month"  . Drug Use: No  . Sexually Active: Yes    Birth Control/ Protection: Post-menopausal     Comment: Menarche age 70, G80,P2, Menopause in 60"s, No HRT Married   Other Topics Concern  . Not on file   Social History Narrative  . No narrative on file     No Known Allergies   Outpatient Prescriptions Prior to Visit  Medication Sig Dispense Refill  . acyclovir (ZOVIRAX) 200 MG capsule Take 2 capsules (400 mg total) by mouth 5 (five) times daily.  20 capsule  0  . Calcium Carbonate-Vitamin D (CALCIUM 500 + D) 500-125 MG-UNIT TABS Take 1 tablet by mouth 2 (two) times daily.       . Cholecalciferol (VITAMIN D PO) Take 1 tablet by mouth daily. 1000 IUs daily      . DiphenhydrAMINE HCl, Sleep, (SIMPLY SLEEP PO) Take 2 tablets by mouth at bedtime.       . Lansoprazole (PREVACID 24HR PO) Take 1  capsule by mouth every morning.      [name] letrozole (FEMARA) 2.5 MG tablet Take 1 tablet (2.5 mg total) by mouth daily.  90 tablet  5  . loratadine (CLARITIN) 10 MG tablet Take 1 tablet (10 mg total) by mouth daily.  30 tablet  0  . sertraline (ZOLOFT) 100 MG tablet Take 200 mg by mouth every morning.       No facility-administered medications prior to visit.       Objective:   Physical Exam Filed Vitals:   11/22/12 1439  BP: 110/72  Pulse: 88  Height: 5' 3.5" (1.613 m)  Weight: 117 lb 3.2 oz (53.162 kg)  SpO2: 97%   Gen: Pleasant, thin, in no distress,  normal affect  ENT: No lesions,  mouth clear,  oropharynx clear, no postnasal drip  Neck: No JVD, no TMG, no carotid bruits  Lungs: No use of  accessory muscles,  clear without rales or rhonchi  Cardiovascular: RRR, heart sounds normal, no murmur or gallops, no peripheral edema  Musculoskeletal: No deformities, no cyanosis or clubbing  Neuro: alert, non focal  Skin: Warm, no lesions or rashes     Assessment & Plan:  Dyspnea on exertion Suspect that she did have LLL CAP based on her follow up CXR done 5/28 that shows residual infiltrate. Also suspect COPD, severity unclear.  - check full PFT - CXR in a month - if CXR shows persistent infiltrate then favor a CT scan to clarify

## 2012-12-30 ENCOUNTER — Ambulatory Visit (INDEPENDENT_AMBULATORY_CARE_PROVIDER_SITE_OTHER): Payer: Medicare Other | Admitting: Emergency Medicine

## 2012-12-30 ENCOUNTER — Ambulatory Visit (INDEPENDENT_AMBULATORY_CARE_PROVIDER_SITE_OTHER)
Admission: RE | Admit: 2012-12-30 | Discharge: 2012-12-30 | Disposition: A | Discharge status: Home / Self Care | Payer: Medicare Other | Source: Ambulatory Visit | Attending: Emergency Medicine | Admitting: Emergency Medicine

## 2012-12-30 ENCOUNTER — Encounter: Payer: Self-pay | Admitting: Emergency Medicine

## 2012-12-30 VITALS — BP 130/70 | HR 65 | Temp 97.7°F | Ht 63.0 in | Wt 119.0 lb

## 2012-12-30 DIAGNOSIS — R0609 Other forms of dyspnea: Secondary | ICD-10-CM

## 2012-12-30 DIAGNOSIS — J189 Pneumonia, unspecified organism: Secondary | ICD-10-CM

## 2012-12-30 DIAGNOSIS — R0600 Dyspnea, unspecified: Secondary | ICD-10-CM

## 2012-12-30 DIAGNOSIS — J449 Chronic obstructive pulmonary disease, unspecified: Secondary | ICD-10-CM

## 2012-12-30 DIAGNOSIS — J4489 Other specified chronic obstructive pulmonary disease: Secondary | ICD-10-CM

## 2012-12-30 LAB — PULMONARY FUNCTION TEST

## 2012-12-30 NOTE — Progress Notes (Signed)
PFT done today. 

## 2012-12-30 NOTE — Assessment & Plan Note (Signed)
LLL PNA is fully cleared on CXR

## 2012-12-30 NOTE — Progress Notes (Signed)
  Subjective:    Patient ID: Dana Pruitt, female    DOB: January 22, 1941, 72 y.o.   MRN: [id number]  HPI 72 yo woman, former smoker (, hx breast CA,  Was dx with LLL CAP admitted to [facility], followed by Dr Renne Crigler end of April. Repeat CXR 5/28 at Dr Carolee Rota office > some continued haziness in the LLL. She improved during the hospitalization, but has continued to have exertional SOB. No fever, no cough, no wheezing. She has had some GERD sx.  Hasn't used the SABA that she received at discharge.   ROV 12/30/12 -- f/u for dyspnea, tobacco hx, recent LLL PNA with residual infiltrate. Returns today after PFT and repeat CXR. Shows mild AFL, no BD response, normal volumes, decreased DLCO that corrects for Va. CXR today shows resolution of previous LLL infiltrate. She feels back to baseline. No exertional limitations. Some rare chest tightness.    PULMONARY FUNCTON TEST 12/30/2012  FVC 2.77  FEV1 1.92  FEV1/FVC 69.3  FVC  % Predicted 101  FEV % Predicted 89  FeF 25-75 2.7  FeF 25-75 % Predicted 2.72    Review of Systems  Constitutional: Negative for fever and unexpected weight change.  HENT: Negative for ear pain, nosebleeds, congestion, sore throat, rhinorrhea, sneezing, trouble swallowing, dental problem, postnasal drip and sinus pressure.   Eyes: Negative for redness and itching.  Respiratory: Negative for cough, chest tightness, shortness of breath and wheezing.   Cardiovascular: Negative for palpitations and leg swelling.  Gastrointestinal: Negative for nausea and vomiting.  Genitourinary: Negative for dysuria.  Musculoskeletal: Negative for joint swelling.  Skin: Negative for rash.  Neurological: Negative for headaches.  Hematological: Does not bruise/bleed easily.  Psychiatric/Behavioral: Negative for dysphoric mood. The patient is not nervous/anxious.        Objective:   Physical Exam Filed Vitals:   12/30/12 1326  BP: 130/70  Pulse: 65  Temp: 97.7 F (36.5 C)  TempSrc: Oral   Height: 5\' 3"  (1.6 m)  Weight: 119 lb (53.978 kg)  SpO2: 97%   Gen: Pleasant, thin, in no distress,  normal affect  ENT: No lesions,  mouth clear,  oropharynx clear, no postnasal drip  Neck: No JVD, no TMG, no carotid bruits  Lungs: No use of accessory muscles,  clear without rales or rhonchi  Cardiovascular: RRR, heart sounds normal, no murmur or gallops, no peripheral edema  Musculoskeletal: No deformities, no cyanosis or clubbing  Neuro: alert, non focal  Skin: Warm, no lesions or rashes     Assessment & Plan:  COPD (chronic obstructive pulmonary disease) Mild by spiro and minimal sx.  - will teach her SABA to have for PRN - no need for f/u unless she changes.   CAP (community acquired pneumonia) LLL PNA is fully cleared on CXR

## 2012-12-30 NOTE — Assessment & Plan Note (Signed)
Mild by spiro and minimal sx.  - will teach her SABA to have for PRN - no need for f/u unless she changes.

## 2012-12-30 NOTE — Patient Instructions (Addendum)
Your CXR has cleared from your prior pneumonia Use albuterol 2 puffs if needed for shortness of breath Follow with Dr Renne Crigler,  or with Dr Delton Coombes if your short windedness worsens of if you have any new breathing problems

## 2013-01-20 ENCOUNTER — Other Ambulatory Visit: Payer: Self-pay | Admitting: Dermatology

## 2013-04-24 ENCOUNTER — Telehealth (INDEPENDENT_AMBULATORY_CARE_PROVIDER_SITE_OTHER): Payer: Self-pay | Admitting: General Surgery

## 2013-04-24 NOTE — Telephone Encounter (Signed)
Message copied by Liliana Cline on Mon Apr 24, 2013  4:10 PM ------      Message from: Larry Sierras      Created: Mon Apr 24, 2013  3:46 PM      Regarding: MG      Contact: [phone]       MG @ [facility]/JULY 2014.  DO YOU HAVE THE REPORT FOR HER APPT TOMORROW, Apr 25 2013? GM ------

## 2013-04-24 NOTE — Telephone Encounter (Signed)
Patient made aware we have mammogram report for her appt tomorrow.

## 2013-04-25 ENCOUNTER — Ambulatory Visit (INDEPENDENT_AMBULATORY_CARE_PROVIDER_SITE_OTHER): Payer: Medicare Other | Admitting: Surgery

## 2013-04-25 ENCOUNTER — Encounter (INDEPENDENT_AMBULATORY_CARE_PROVIDER_SITE_OTHER): Payer: Self-pay | Admitting: Surgery

## 2013-04-25 VITALS — BP 122/62 | HR 100 | Resp 14 | Ht 63.5 in | Wt 120.0 lb

## 2013-04-25 DIAGNOSIS — Z853 Personal history of malignant neoplasm of breast: Secondary | ICD-10-CM

## 2013-04-25 NOTE — Patient Instructions (Signed)
Have the radiolgist send [place] a copy of your next mammogram. Continue annual follow up visits here

## 2013-04-25 NOTE — Progress Notes (Signed)
NAME: DELORIS MOGER       DOB: 03-27-1941           DATE: 04/25/2013       MRN: [id number]   Dana Pruitt is a 72 y.o.Marland Kitchenfemale who presents for routine followup of her Right breast cancer, IDC, Stage IIA, receptor + diagnosed in Sept,  2011 and treated with neoadjuvant hormonal, lumpectomy with SLN, radiation, continuing on Femara. She has no problems or concerns on either side.  PFSH: She has had no significant changes since the last visit here.  ROS: There have been no significant changes since the last visit here  EXAM: General: The patient is alert, oriented, generally healty appearing, NAD. Mood and affect are normal.  Breasts:  Right show mild radiation changes above the incision. Otherwise normal post op  Lymphatics: She has no axillary or supraclavicular adenopathy on either side.  Extremities: Full ROM of the surgical side with no lymphedema noted.  Data Reviewed: Mammogram in July negative but short term F/U recommended  Impression: Doing well, with no evidence of recurrent cancer or new cancer  Plan: RTC one year

## 2013-05-19 ENCOUNTER — Ambulatory Visit: Payer: Medicare Other

## 2013-05-19 ENCOUNTER — Other Ambulatory Visit: Payer: Medicare Other | Admitting: Lab

## 2013-05-22 ENCOUNTER — Other Ambulatory Visit: Payer: Self-pay | Admitting: Oncology

## 2013-05-22 ENCOUNTER — Other Ambulatory Visit (HOSPITAL_BASED_OUTPATIENT_CLINIC_OR_DEPARTMENT_OTHER): Payer: Medicare Other | Admitting: Lab

## 2013-05-22 ENCOUNTER — Ambulatory Visit (HOSPITAL_BASED_OUTPATIENT_CLINIC_OR_DEPARTMENT_OTHER): Payer: Medicare Other

## 2013-05-22 VITALS — BP 154/74 | HR 77 | Temp 98.1°F

## 2013-05-22 DIAGNOSIS — C801 Malignant (primary) neoplasm, unspecified: Secondary | ICD-10-CM

## 2013-05-22 DIAGNOSIS — Z853 Personal history of malignant neoplasm of breast: Secondary | ICD-10-CM

## 2013-05-22 DIAGNOSIS — M899 Disorder of bone, unspecified: Secondary | ICD-10-CM

## 2013-05-22 DIAGNOSIS — Z23 Encounter for immunization: Secondary | ICD-10-CM

## 2013-05-22 DIAGNOSIS — E559 Vitamin D deficiency, unspecified: Secondary | ICD-10-CM

## 2013-05-22 DIAGNOSIS — C50919 Malignant neoplasm of unspecified site of unspecified female breast: Secondary | ICD-10-CM

## 2013-05-22 LAB — COMPREHENSIVE METABOLIC PANEL (CC13)
Albumin: 4.1 g/dL (ref 3.5–5.0)
Anion Gap: 9 mEq/L (ref 3–11)
BUN: 19.8 mg/dL (ref 7.0–26.0)
CO2: 23 mEq/L (ref 22–29)
Calcium: 10.4 mg/dL (ref 8.4–10.4)
Glucose: 121 mg/dl (ref 70–140)
Potassium: 4.3 mEq/L (ref 3.5–5.1)
Sodium: 138 mEq/L (ref 136–145)
Total Protein: 7 g/dL (ref 6.4–8.3)

## 2013-05-22 LAB — CBC WITH DIFFERENTIAL/PLATELET
Basophils Absolute: 0.1 10*3/uL (ref 0.0–0.1)
EOS%: 3.8 % (ref 0.0–7.0)
HCT: 41.4 % (ref 34.8–46.6)
HGB: 14.2 g/dL (ref 11.6–15.9)
LYMPH%: 18.3 % (ref 14.0–49.7)
MCH: 31.2 pg (ref 25.1–34.0)
MONO#: 0.5 10*3/uL (ref 0.1–0.9)
NEUT%: 68.4 % (ref 38.4–76.8)
Platelets: 218 10*3/uL (ref 145–400)
lymph#: 1 10*3/uL (ref 0.9–3.3)

## 2013-05-22 MED ORDER — DENOSUMAB 60 MG/ML ~~LOC~~ SOLN
60.0000 mg | Freq: Once | SUBCUTANEOUS | Status: AC
  Administered 2013-05-22: 60 mg via SUBCUTANEOUS
  Filled 2013-05-22: qty 1

## 2013-05-22 MED ORDER — INFLUENZA VAC SPLIT QUAD 0.5 ML IM SUSP
0.5000 mL | INTRAMUSCULAR | Status: AC
  Administered 2013-05-22: 0.5 mL via INTRAMUSCULAR
  Filled 2013-05-22: qty 0.5

## 2013-11-15 ENCOUNTER — Other Ambulatory Visit: Payer: Self-pay | Admitting: *Deleted

## 2013-11-15 DIAGNOSIS — C50419 Malignant neoplasm of upper-outer quadrant of unspecified female breast: Secondary | ICD-10-CM

## 2013-11-16 ENCOUNTER — Ambulatory Visit (HOSPITAL_BASED_OUTPATIENT_CLINIC_OR_DEPARTMENT_OTHER): Payer: Medicare Other | Admitting: Oncology

## 2013-11-16 ENCOUNTER — Other Ambulatory Visit (HOSPITAL_BASED_OUTPATIENT_CLINIC_OR_DEPARTMENT_OTHER): Payer: Medicare Other

## 2013-11-16 ENCOUNTER — Telehealth: Payer: Self-pay | Admitting: Oncology

## 2013-11-16 ENCOUNTER — Ambulatory Visit: Payer: Medicare Other

## 2013-11-16 ENCOUNTER — Other Ambulatory Visit: Payer: Self-pay | Admitting: *Deleted

## 2013-11-16 ENCOUNTER — Ambulatory Visit (HOSPITAL_BASED_OUTPATIENT_CLINIC_OR_DEPARTMENT_OTHER): Payer: Medicare Other

## 2013-11-16 VITALS — BP 160/81 | HR 82 | Temp 97.9°F | Resp 18 | Ht 63.5 in | Wt 119.9 lb

## 2013-11-16 DIAGNOSIS — M899 Disorder of bone, unspecified: Secondary | ICD-10-CM

## 2013-11-16 DIAGNOSIS — C50419 Malignant neoplasm of upper-outer quadrant of unspecified female breast: Secondary | ICD-10-CM

## 2013-11-16 DIAGNOSIS — E559 Vitamin D deficiency, unspecified: Secondary | ICD-10-CM

## 2013-11-16 DIAGNOSIS — Z853 Personal history of malignant neoplasm of breast: Secondary | ICD-10-CM

## 2013-11-16 DIAGNOSIS — R5381 Other malaise: Secondary | ICD-10-CM

## 2013-11-16 DIAGNOSIS — M949 Disorder of cartilage, unspecified: Secondary | ICD-10-CM

## 2013-11-16 DIAGNOSIS — C50919 Malignant neoplasm of unspecified site of unspecified female breast: Secondary | ICD-10-CM

## 2013-11-16 DIAGNOSIS — Z79811 Long term (current) use of aromatase inhibitors: Secondary | ICD-10-CM

## 2013-11-16 DIAGNOSIS — Z170 Estrogen receptor positive status [ER+]: Secondary | ICD-10-CM

## 2013-11-16 DIAGNOSIS — M8580 Other specified disorders of bone density and structure, unspecified site: Secondary | ICD-10-CM | POA: Insufficient documentation

## 2013-11-16 LAB — CBC WITH DIFFERENTIAL/PLATELET
BASO%: 0.4 % (ref 0.0–2.0)
BASOS ABS: 0 10*3/uL (ref 0.0–0.1)
EOS ABS: 0.1 10*3/uL (ref 0.0–0.5)
EOS%: 2.3 % (ref 0.0–7.0)
HEMATOCRIT: 37.5 % (ref 34.8–46.6)
HEMOGLOBIN: 12.9 g/dL (ref 11.6–15.9)
LYMPH%: 18.9 % (ref 14.0–49.7)
MCH: 32 pg (ref 25.1–34.0)
MCHC: 34.4 g/dL (ref 31.5–36.0)
MCV: 93.1 fL (ref 79.5–101.0)
MONO#: 0.5 10*3/uL (ref 0.1–0.9)
MONO%: 8.7 % (ref 0.0–14.0)
NEUT%: 69.7 % (ref 38.4–76.8)
NEUTROS ABS: 3.7 10*3/uL (ref 1.5–6.5)
PLATELETS: 217 10*3/uL (ref 145–400)
RBC: 4.03 10*6/uL (ref 3.70–5.45)
RDW: 14.7 % — ABNORMAL HIGH (ref 11.2–14.5)
WBC: 5.3 10*3/uL (ref 3.9–10.3)
lymph#: 1 10*3/uL (ref 0.9–3.3)

## 2013-11-16 LAB — COMPREHENSIVE METABOLIC PANEL (CC13)
ALBUMIN: 4 g/dL (ref 3.5–5.0)
ALK PHOS: 40 U/L (ref 40–150)
ALT: 11 U/L (ref 0–55)
AST: 18 U/L (ref 5–34)
Anion Gap: 9 mEq/L (ref 3–11)
BUN: 19.2 mg/dL (ref 7.0–26.0)
CALCIUM: 10.1 mg/dL (ref 8.4–10.4)
CO2: 23 mEq/L (ref 22–29)
CREATININE: 1 mg/dL (ref 0.6–1.1)
Chloride: 106 mEq/L (ref 98–109)
GLUCOSE: 98 mg/dL (ref 70–140)
Potassium: 4.3 mEq/L (ref 3.5–5.1)
Sodium: 138 mEq/L (ref 136–145)
Total Bilirubin: 0.58 mg/dL (ref 0.20–1.20)
Total Protein: 6.6 g/dL (ref 6.4–8.3)

## 2013-11-16 MED ORDER — LETROZOLE 2.5 MG PO TABS: 2.5000 mg | ORAL_TABLET | Freq: Every day | ORAL | Status: DC

## 2013-11-16 MED ORDER — DENOSUMAB 60 MG/ML ~~LOC~~ SOLN
60.0000 mg | Freq: Once | SUBCUTANEOUS | Status: AC
  Administered 2013-11-16: 60 mg via SUBCUTANEOUS
  Filled 2013-11-16: qty 1

## 2013-11-16 NOTE — Patient Instructions (Signed)
Denosumab injection  What is this medicine?  DENOSUMAB (den oh sue mab) slows bone breakdown. Prolia is used to treat osteoporosis in women after menopause and in men. Xgeva is used to prevent bone fractures and other bone problems caused by cancer bone metastases. Xgeva is also used to treat giant cell tumor of the bone.  This medicine may be used for other purposes; ask your health care provider or pharmacist if you have questions.  COMMON BRAND NAME(S): Prolia, XGEVA  What should I tell my health care provider before I take this medicine?  They need to know if you have any of these conditions:  -dental disease  -eczema  -infection or history of infections  -kidney disease or on dialysis  -low blood calcium or vitamin D  -malabsorption syndrome  -scheduled to have surgery or tooth extraction  -taking medicine that contains denosumab  -thyroid or parathyroid disease  -an unusual reaction to denosumab, other medicines, foods, dyes, or preservatives  -pregnant or trying to get pregnant  -breast-feeding  How should I use this medicine?  This medicine is for injection under the skin. It is given by a health care professional in a hospital or clinic setting.  If you are getting Prolia, a special MedGuide will be given to you by the pharmacist with each prescription and refill. Be sure to read this information carefully each time.  For Prolia, talk to your pediatrician regarding the use of this medicine in children. Special care may be needed. For Xgeva, talk to your pediatrician regarding the use of this medicine in children. While this drug may be prescribed for children as young as 13 years for selected conditions, precautions do apply.  Overdosage: If you think you've taken too much of this medicine contact a poison control center or emergency room at once.  Overdosage: If you think you have taken too much of this medicine contact a poison control center or emergency room at once.  NOTE: This medicine is only for  you. Do not share this medicine with others.  What if I miss a dose?  It is important not to miss your dose. Call your doctor or health care professional if you are unable to keep an appointment.  What may interact with this medicine?  Do not take this medicine with any of the following medications:  -other medicines containing denosumab  This medicine may also interact with the following medications:  -medicines that suppress the immune system  -medicines that treat cancer  -steroid medicines like prednisone or cortisone  This list may not describe all possible interactions. Give your health care provider a list of all the medicines, herbs, non-prescription drugs, or dietary supplements you use. Also tell them if you smoke, drink alcohol, or use illegal drugs. Some items may interact with your medicine.  What should I watch for while using this medicine?  Visit your doctor or health care professional for regular checks on your progress. Your doctor or health care professional may order blood tests and other tests to see how you are doing.  Call your doctor or health care professional if you get a cold or other infection while receiving this medicine. Do not treat yourself. This medicine may decrease your body's ability to fight infection.  You should make sure you get enough calcium and vitamin D while you are taking this medicine, unless your doctor tells you not to. Discuss the foods you eat and the vitamins you take with your health care professional.    See your dentist regularly. Brush and floss your teeth as directed. Before you have any dental work done, tell your dentist you are receiving this medicine.  Do not become pregnant while taking this medicine or for 5 months after stopping it. Women should inform their doctor if they wish to become pregnant or think they might be pregnant. There is a potential for serious side effects to an unborn child. Talk to your health care professional or pharmacist for more  information.  What side effects may I notice from receiving this medicine?  Side effects that you should report to your doctor or health care professional as soon as possible:  -allergic reactions like skin rash, itching or hives, swelling of the face, lips, or tongue  -breathing problems  -chest pain  -fast, irregular heartbeat  -feeling faint or lightheaded, falls  -fever, chills, or any other sign of infection  -muscle spasms, tightening, or twitches  -numbness or tingling  -skin blisters or bumps, or is dry, peels, or red  -slow healing or unexplained pain in the mouth or jaw  -unusual bleeding or bruising  Side effects that usually do not require medical attention (Report these to your doctor or health care professional if they continue or are bothersome.):  -muscle pain  -stomach upset, gas  This list may not describe all possible side effects. Call your doctor for medical advice about side effects. You may report side effects to FDA at 1-800-FDA-1088.  Where should I keep my medicine?  This medicine is only given in a clinic, doctor's office, or other health care setting and will not be stored at home.  NOTE: This sheet is a summary. It may not cover all possible information. If you have questions about this medicine, talk to your doctor, pharmacist, or health care provider.  © 2014, [employer]vier/Gold Standard. (2011-12-07 12:37:47)

## 2013-11-16 NOTE — Progress Notes (Signed)
Dana Pruitt  [phone] Fax:(336) [phone]  OFFICE PROGRESS NOTE   ID: Dana Pruitt   DOB: March 02, 1941  MR#: [id number]  TDD#:[id number]   PCP: Horatio Pel, MD GYN:  SUNeldon Mc, M.D. RAD WCB:JSEGB Pablo Ledger, M.D.   HISTORY OF PRESENT ILLNESS: From Dr. Collier Salina Rubin's the patient evaluation note dated 03/06/2010:  "This is a delightful 73 year old woman here today with her husband, Dana Pruitt, for evaluation and treatment of recent diagnosis of breast cancer.  This woman has been in good health.  She gets annual screening mammography up until two years ago.  She scheduled mammogram and about two days later felt a mass in the superior portion of the right breast.  She sought medical attention for this.  A mammogram and ultrasound performed on 02/25/2010 showed a mass measuring about 2.5 x 2.8 x 2.0 cm, right axillary lymph node with a fatty hilum was also seen.  Mammography showed this mass to be about 3.0 x 2.5 x 2.0 cm.  Biopsy was recommended, which was performed on 02/27/2010.  This showed intermediate-grade carcinoma, ER/PR positive 191% respectively, proliferative index 22%, HER-2 was not amplified with a ratio of 1.29.  She has been referred to see Dr. Margot Chimes and has seen [place] today for consideration of neoadjuvant therapy.  She is due for an MRI scan this coming Friday."   Her subsequent history is as detailed below.   INTERVAL HISTORY: Dana Pruitt returns today for followup of her breast cancer. Interval history is unremarkable. She is doing "great". In particular she is tolerating the letrozole with no concerns regarding hot flashes or vaginal dryness. She is also on Prolia, again with no associated side effects that she is aware of.  REVIEW OF SYSTEMS: She exercises chiefly by doing a lot of yard work and walking the dog. A detailed review of systems today was otherwise entirely negative.  PAST MEDICAL HISTORY: Past Medical History  Diagnosis  Date  . Anxiety   . Exertional shortness of breath     "recently" (10/17/2012)  . Anemia     "several times over the years" (10/17/2012)  . Carcinoma of breast treated with adjuvant hormone therapy 9/11    Femara  . Breast cancer 02/2010    right breast  . 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 Pneumonia     "that's why I'm here" (10/17/2012)  She has a history of mild depression.   PAST SURGICAL HISTORY: Past Surgical History  Procedure Laterality Date  . Cesarean section  1970; 1974  . Appendectomy  1974  . Dilation and curettage of uterus  1970's  . Colonoscopy    . Breast lumpectomy with sentinel lymph node biopsy Right 07/03/11    Invasive Ductal Carcinoma;0/3 nodes negative,; ER,PR Positive, Her-2 Neg; Ki-67 22%  . Breast biopsy Right 02/27/10    Needle core Biopsy; Invasive Mammary; ER/PR Positive, Her-2 Neu negative, Ki-67 22%  . Tubal ligation  1974    FAMILY HISTORY Family History  Problem Relation Age of Onset  . Cancer Neg Hx   . Heart disease Mother   . Heart disease Maternal Grandmother   . Heart disease Maternal Grandfather   The patient's father is deceased and her mother is alive and lives in a nursing facility.  She has one sister who is alive and well living in [place].     GYNECOLOGIC HISTORY: G2, P2.  Menarche at age 81.  Menopause in her 60s.  She is status post two  C-sections and has not had any hormonal replacement therapy.  There is no other family history of breast cancer in the family   SOCIAL HISTORY: The patient has been married since 1968.  Her husband Dana Pruitt is a retired stockbroker. They have two adult sons, one of her sons lives in [place] and her other son lives in [place], .  She has two grandchildren.  In her spare time, she enjoys spending time with her friends and family , traveling, going to the beach, and gardening.   ADVANCED DIRECTIVES:  Not on file  HEALTH MAINTENANCE: History  Substance Use Topics   . Smoking status: Former Smoker -- 2.00 packs/day for 25 years    Types: Cigarettes    Quit date: 06/30/1990  . Smokeless tobacco: Never Used  . Alcohol Use: 0.6 oz/week    1 Glasses of wine per week     Comment: 10/17/2012 "3-4 glasses of wine/month"    Colonoscopy:   PAP:  02/21/2010 Bone density:  2013; we are requesting results from [facility] Lipid panel:  Not on file  No Known Allergies  Current Outpatient Prescriptions  Medication Sig Dispense Refill  . acyclovir (ZOVIRAX) 200 MG capsule Take 2 capsules (400 mg total) by mouth 5 (five) times daily.  20 capsule  0  . Calcium Carbonate-Vitamin D (CALCIUM 500 + D) 500-125 MG-UNIT TABS Take 1 tablet by mouth 2 (two) times daily.       . Cholecalciferol (VITAMIN D PO) Take 1 tablet by mouth daily. 1000 IUs daily      . Lansoprazole (PREVACID 24HR PO) Take 1 capsule by mouth every morning.      . letrozole (FEMARA) 2.5 MG tablet Take 1 tablet (2.5 mg total) by mouth daily.  90 tablet  5  . loratadine (CLARITIN) 10 MG tablet Take 10 mg by mouth daily as needed.      . sertraline (ZOLOFT) 100 MG tablet Take 200 mg by mouth every morning.       No current facility-administered medications for this visit.    OBJECTIVE: Middle-aged white woman who appears younger than stated age.  Filed Vitals:   11/16/13 1051  BP: 160/81  Pulse: 82  Temp: 97.9 F (36.6 C)  Resp: 18     Body mass index is 20.9 kg/(m^2).      ECOG FS: 0 - Asymptomatic  Sclerae unicteric, EOMs intact Oropharynx clear and moist- No cervical or supraclavicular adenopathy Lungs no rales or rhonchi Heart regular rate and rhythm Abd soft, nontender, positive bowel sounds MSK no focal spinal tenderness, no upper extremity lymphedema Neuro: nonfocal, well oriented, appropriate affect Breasts: The right breast is status post lumpectomy and radiation. There is no evidence of local recurrence. The right axilla is benign. The left breast is unremarkable.  LAB  RESULTS: Lab Results  Component Value Date   WBC 5.3 11/16/2013   NEUTROABS 3.7 11/16/2013   HGB 12.9 11/16/2013   HCT 37.5 11/16/2013   MCV 93.1 11/16/2013   PLT 217 11/16/2013      Chemistry      Component Value Date/Time   NA 138 11/16/2013 1042   NA 137 10/20/2012 0600   K 4.3 11/16/2013 1042   K 4.0 10/20/2012 0600   CL 104 10/20/2012 0600   CL 105 03/31/2012 1410   CO2 23 11/16/2013 1042   CO2 25 10/20/2012 0600   BUN 19.2 11/16/2013 1042   BUN 9 10/20/2012 0600   CREATININE 1.0 11/16/2013 1042     CREATININE 0.75 10/20/2012 0600      Component Value Date/Time   CALCIUM 10.1 11/16/2013 1042   CALCIUM 8.8 10/20/2012 0600   ALKPHOS 40 11/16/2013 1042   ALKPHOS 36* 08/20/2011 0934   AST 18 11/16/2013 1042   AST 15 08/20/2011 0934   ALT 11 11/16/2013 1042   ALT 13 08/20/2011 0934   BILITOT 0.58 11/16/2013 1042   BILITOT 0.5 08/20/2011 0934      Lab Results  Component Value Date   LABCA2 30 11/12/2010    Urinalysis    Component Value Date/Time   COLORURINE YELLOW 10/17/2012 1738   APPEARANCEUR CLEAR 10/17/2012 1738   LABSPEC 1.015 10/17/2012 1738   PHURINE 6.5 10/17/2012 1738   GLUCOSEU NEGATIVE 10/17/2012 1738   HGBUR NEGATIVE 10/17/2012 1738   BILIRUBINUR NEGATIVE 10/17/2012 1738   KETONESUR NEGATIVE 10/17/2012 1738   PROTEINUR 30* 10/17/2012 1738   UROBILINOGEN 0.2 10/17/2012 1738   NITRITE NEGATIVE 10/17/2012 1738   LEUKOCYTESUR NEGATIVE 10/17/2012 1738    STUDIES: Mammography January 2015 was benign   ASSESSMENT: 73 y.o.Tatums woman: 1.  Status post right breast upper outer quadrant biopsy 02/27/2010 for a clinical T2 N0, stage IIA invasive ductal carcinoma, grade 2, ER 100%, PR 91%, Ki-67 22%, HER-2/neu nonamplified  2.  Oncotype DX score of 13 predicted and outside the breast risk of recurrence within 10 years of 8% if the patient's only systemic treatment is tamoxifen for 5 years   3.  started letrozole neoadjuvantly in 02/2010.  4.  Status post right lumpectomy with right  axillary node sentinel biopsy on 07/03/2011 for a ypT2, ypN0, stage IIA  invasive ductal carcinoma, grade 2, estrogen receptor 100% positive, progesterone receptor 13% positive, repeat HER-2 again negative  5. status post adjuvant radiation therapy from 08/05/2011 through 09/02/2011.    6. To continue letrozole through September 2016   7. osteopenia, on Prolia every 6 months  PLAN: [place] is tolerating her treatments well and the plan is going to be to continue the anastrozole for a total of 5 years. We will also continue the Prolia during that time. I will see her again at the time of her next Prolia dose in early December. She knows to call for any problems that may develop before that visit.   Chauncey Cruel, MD  11/16/2013, 11:44 AM

## 2014-02-15 ENCOUNTER — Ambulatory Visit (INDEPENDENT_AMBULATORY_CARE_PROVIDER_SITE_OTHER): Payer: Medicare Other | Admitting: Neurology

## 2014-02-15 ENCOUNTER — Other Ambulatory Visit (HOSPITAL_COMMUNITY): Payer: Self-pay | Admitting: Cardiology

## 2014-02-15 ENCOUNTER — Encounter: Payer: Self-pay | Admitting: Neurology

## 2014-02-15 VITALS — BP 152/69 | HR 69 | Ht 64.0 in | Wt 121.8 lb

## 2014-02-15 DIAGNOSIS — H34219 Partial retinal artery occlusion, unspecified eye: Secondary | ICD-10-CM

## 2014-02-15 DIAGNOSIS — H34212 Partial retinal artery occlusion, left eye: Secondary | ICD-10-CM | POA: Insufficient documentation

## 2014-02-15 NOTE — Patient Instructions (Addendum)
-   will schedule a MRI brain to evaluate silent stroke - will schedule CTA head and neck to evaluate vasculature - continue Carotid doppler as ordered by PCP - blood draw for stroke labs - continue ASA and lipitor - recommend BP monitoring at home. - follow up in one month

## 2014-02-15 NOTE — Progress Notes (Signed)
NEUROLOGY CLINIC NEW PATIENT NOTE  NAME: Dana Pruitt DOB: 08/13/1940  I saw Dana Pruitt as a new patient in the neurovascular clinic today regarding  Chief Complaint  Patient presents with  . Neurologic Problem    Hollenhorst Np#1  .  HPI: Dana Pruitt is a 73 y.o. 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 She was at her normal health and went to see ophthalmologist for annual eye check. She was told that she had hollenhorst plaque in her left eye. PCP ordered CUS and 2D echo for her and requested neurology referral. No vision loss, double vision, visual field cut, etc.  She had a couple of years not seeing her ophthalmologist. Vision OK, but with early cataract. No need intervention. She will follow up in 3 months.   She has breast cancer s/p lumpectomy and radiation in 2013 and started on letrozole in 02/2010. She will finish off course of letrozole in 02/2015. She denies any metastasis.  No DM history, her BP up and down at home, not on any meds for HTN, HLD 272 cholestrol last check, and quit smoking 25 years ago. Alcohol occasional. No illicit drug  FH include father had mini strokes with no deficit, died at 60s. Mom mini strokes at 63s and died at 29. Sister healthy.  Past Medical History  Diagnosis Date  . Anxiety   . Exertional shortness of breath     "recently" (10/17/2012)  . Anemia     "several times over the years" (10/17/2012)  . Carcinoma of breast treated with adjuvant hormone therapy 9/11    Femara  . Breast cancer 02/2010    right breast  . 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 Pneumonia     "that's why I'm here" (10/17/2012)   Past Surgical History  Procedure Laterality Date  . Cesarean section  1970; 1974  . Appendectomy  1974  . Dilation and curettage of uterus  1970's  . Colonoscopy    . Breast lumpectomy with sentinel lymph node biopsy Right 07/03/11    Invasive Ductal  Carcinoma;0/3 nodes negative,; ER,PR Positive, Her-2 Neg; Ki-67 22%  . Breast biopsy Right 02/27/10    Needle core Biopsy; Invasive Mammary; ER/PR Positive, Her-2 Neu negative, Ki-67 22%  . Tubal ligation  1974   Family History  Problem Relation Age of Onset  . Heart disease Mother   . Heart disease Maternal Grandmother   . Heart disease Maternal Grandfather   . Cancer Father    Current Outpatient Prescriptions  Medication Sig Dispense Refill  . aspirin 81 MG tablet Take 81 mg by mouth daily.      [name] acyclovir (ZOVIRAX) 200 MG capsule Take 2 capsules (400 mg total) by mouth 5 (five) times daily.  20 capsule  0  . atorvastatin (LIPITOR) 20 MG tablet Take 29 mg by mouth daily.      . Calcium Carbonate-Vitamin D (CALCIUM 500 + D) 500-125 MG-UNIT TABS Take 1 tablet by mouth 2 (two) times daily.       . Cholecalciferol (VITAMIN D PO) Take 1 tablet by mouth daily. 1000 IUs daily      . Lansoprazole (PREVACID 24HR PO) Take 1 capsule by mouth every morning.      [name] letrozole (FEMARA) 2.5 MG tablet Take 1 tablet (2.5 mg total) by mouth daily.  90 tablet  5  . loratadine (CLARITIN) 10 MG tablet Take  10 mg by mouth daily as needed.      . sertraline (ZOLOFT) 100 MG tablet Take 200 mg by mouth every morning.       No current facility-administered medications for this visit.   No Known Allergies History   Social History  . Marital Status: Married    Spouse Name: N/A    Number of Children: 2  . Years of Education: college   Occupational History  .     Social History Main Topics  . Smoking status: Former Smoker -- 2.00 packs/day for 25 years    Types: Cigarettes    Quit date: 06/30/1990  . Smokeless tobacco: Never Used  . Alcohol Use: 0.6 oz/week    1 Glasses of wine per week     Comment: 10/17/2012 "3-4 glasses of wine/month"  . Drug Use: No  . Sexual Activity: Yes    Birth Control/ Protection: Post-menopausal     Comment: Menarche age 78, G80,P2, Menopause in 65"s, No HRT Married    Other Topics Concern  . Not on file   Social History Narrative  . No narrative on file    Review of Systems Full 14 system review of systems performed and notable only for those listed, all others are neg:  Constitutional: N/A  Cardiovascular: N/A  Ear/Nose/Throat: N/A  Skin: N/A  Eyes: N/A  Respiratory: N/A  Gastroitestinal: N/A  Hematology/Lymphatic: N/A  Endocrine: N/A  Musculoskeletal: N/A  Allergy/Immunology: N/A  Neurological: snoring  Psychiatric: anxiety   Physical Exam  Filed Vitals:   02/15/14 0814  BP: 152/69  Pulse: 102    General - Well nourished, well developed, in no apparent distress.  Ophthalmologic - Sharp disc margins OU.  Cardiovascular - Regular rate and rhythm with no murmur. Carotid pulses were 2+ without bruits .   Neck - supple, no nuchal rigidity .  Mental Status -  Level of arousal and orientation to time, place, and person were intact. Language including expression, naming, repetition, comprehension, reading, and writing was assessed and found intact. Attention span and concentration were normal. Recent and remote memory were intact. Fund of Knowledge was assessed and was intact.  Cranial Nerves II - XII - II - Visual field intact OU. III, IV, VI - Extraocular movements intact. V - Facial sensation intact bilaterally. VII - Facial movement intact bilaterally. VIII - Hearing & vestibular intact bilaterally. X - Palate elevates symmetrically. XI - Chin turning & shoulder shrug intact bilaterally. XII - Tongue protrusion intact.  Motor Strength - The patient's strength was normal in all extremities and pronator drift was absent.  Bulk was normal and fasciculations were absent.   Motor Tone - Muscle tone was assessed at the neck and appendages and was normal.  Reflexes - The patient's reflexes were normal in all extremities and she had no pathological reflexes.  Sensory - Light touch, temperature/pinprick, vibration and  proprioception, and Romberg testing were assessed and were normal.    Coordination - The patient had normal movements in the hands and feet with no ataxia or dysmetria.  Tremor was absent.  Gait and Station - The patient's transfers, posture, gait, station, and turns were observed as normal.   Imaging None  Lab Review Total cholesterol 272  Assessments: NIHSS: 0 Rankin: 0   Assessment and Plan:   In summary, Dana Pruitt is a 73 y.o. female with PMH of breast cancer s/p lumpectomy and radiatoin on letrozole presents in clinic due to asymptomatic hollenhorst plaque in left  eye. Pt completley no symptoms. hollenhorst usually associated with cartoid plaque. Although cancer induced hypercoagulable state is in DDx, but unlikely since it will not cause hollenhurst plaque.   - will schedule a MRI brain to evaluate silent stroke - will schedule CTA head and neck to evaluate vasculature - continue Carotid doppler as ordered by PCP - blood draw for stroke labs - continue ASA and lipitor - recommend BP monitoring at home. - RTC in one month  Thank you very much for the opportunity to participate in the care of this patient.  Please do not hesitate to call if any questions or concerns arise.  Orders Placed This Encounter  Procedures  . MR Brain Wo Contrast    Standing Status: Future     Number of Occurrences:      Standing Expiration Date: 04/19/2015    Scheduling Instructions:     Pt had tomorrow appointment for carotid doppler. Could you schedule the same day? Thanks.    Order Specific Question:  Reason for Exam (SYMPTOM  OR DIAGNOSIS REQUIRED)    Answer:  hollenhorst plaque in left eye    Order Specific Question:  Preferred imaging location?    Answer:  [facility]    Order Specific Question:  Does the patient have a pacemaker or implanted devices?    Answer:  No    Order Specific Question:  What is the patient's sedation requirement?    Answer:  No Sedation  . CT  Angio Head W/Cm &/Or Wo Cm    121 LBS//EPIC ORDER     Standing Status: Future     Number of Occurrences:      Standing Expiration Date: 04/18/2015    Scheduling Instructions:     Pt had tomorrow appointment for carotid doppler. Could you schedule the same day? Thanks.    Order Specific Question:  Reason for Exam (SYMPTOM  OR DIAGNOSIS REQUIRED)    Answer:  hollenhorst plaque in left eye    Order Specific Question:  Preferred imaging location?    Answer:  [facility]  . CT Angio Neck W/Cm &/Or Wo/Cm    Standing Status: Future     Number of Occurrences:      Standing Expiration Date: 04/18/2015    Scheduling Instructions:     Pt had tomorrow appointment for carotid doppler. Could you schedule the same day? Thanks.    Order Specific Question:  Reason for Exam (SYMPTOM  OR DIAGNOSIS REQUIRED)    Answer:  hollenhorst plaque seen in left eye    Order Specific Question:  Preferred imaging location?    Answer:  [facility]  . TSH + free T4  . Lipid panel  . Hemoglobin A1c  . Basic metabolic panel    Patient Instructions  - will schedule a MRI brain to evaluate silent stroke - will schedule CTA head and neck to evaluate vasculature - continue Carotid doppler as ordered by PCP - blood draw for stroke labs - continue ASA and lipitor - recommend BP monitoring at home. - follow up in one month  Rosalin Hawking, MD PhD [facility] [address], [place], [place] [address] [phone]

## 2014-02-16 ENCOUNTER — Other Ambulatory Visit (INDEPENDENT_AMBULATORY_CARE_PROVIDER_SITE_OTHER): Payer: Self-pay

## 2014-02-16 ENCOUNTER — Ambulatory Visit (HOSPITAL_COMMUNITY): Payer: Medicare Other | Attending: Cardiology | Admitting: Cardiology

## 2014-02-16 ENCOUNTER — Other Ambulatory Visit (HOSPITAL_COMMUNITY): Payer: Self-pay | Admitting: Internal Medicine

## 2014-02-16 ENCOUNTER — Ambulatory Visit (HOSPITAL_BASED_OUTPATIENT_CLINIC_OR_DEPARTMENT_OTHER): Payer: Medicare Other | Admitting: Radiology

## 2014-02-16 DIAGNOSIS — H34212 Partial retinal artery occlusion, left eye: Secondary | ICD-10-CM

## 2014-02-16 DIAGNOSIS — I080 Rheumatic disorders of both mitral and aortic valves: Secondary | ICD-10-CM | POA: Insufficient documentation

## 2014-02-16 DIAGNOSIS — J4489 Other specified chronic obstructive pulmonary disease: Secondary | ICD-10-CM | POA: Insufficient documentation

## 2014-02-16 DIAGNOSIS — Z8673 Personal history of transient ischemic attack (TIA), and cerebral infarction without residual deficits: Secondary | ICD-10-CM | POA: Insufficient documentation

## 2014-02-16 DIAGNOSIS — H34219 Partial retinal artery occlusion, unspecified eye: Secondary | ICD-10-CM | POA: Diagnosis present

## 2014-02-16 DIAGNOSIS — Z853 Personal history of malignant neoplasm of breast: Secondary | ICD-10-CM | POA: Insufficient documentation

## 2014-02-16 DIAGNOSIS — I6529 Occlusion and stenosis of unspecified carotid artery: Secondary | ICD-10-CM | POA: Insufficient documentation

## 2014-02-16 DIAGNOSIS — Z0289 Encounter for other administrative examinations: Secondary | ICD-10-CM

## 2014-02-16 DIAGNOSIS — J449 Chronic obstructive pulmonary disease, unspecified: Secondary | ICD-10-CM | POA: Insufficient documentation

## 2014-02-16 DIAGNOSIS — Z87891 Personal history of nicotine dependence: Secondary | ICD-10-CM | POA: Diagnosis not present

## 2014-02-16 DIAGNOSIS — H53129 Transient visual loss, unspecified eye: Secondary | ICD-10-CM | POA: Diagnosis not present

## 2014-02-16 NOTE — Progress Notes (Signed)
Carotid duplex performed 

## 2014-02-16 NOTE — Progress Notes (Signed)
Echocardiogram performed.  

## 2014-02-17 LAB — BASIC METABOLIC PANEL
BUN/Creatinine Ratio: 17 (ref 11–26)
BUN: 18 mg/dL (ref 8–27)
CALCIUM: 10.1 mg/dL (ref 8.7–10.3)
CO2: 23 mmol/L (ref 18–29)
CREATININE: 1.06 mg/dL — AB (ref 0.57–1.00)
Chloride: 105 mmol/L (ref 97–108)
GFR calc Af Amer: 61 mL/min/{1.73_m2} (ref >59)
GFR, EST NON AFRICAN AMERICAN: 53 mL/min/{1.73_m2} — AB (ref >59)
Glucose: 96 mg/dL (ref 65–99)
Potassium: 4.3 mmol/L (ref 3.5–5.2)
SODIUM: 144 mmol/L (ref 134–144)

## 2014-02-17 LAB — LIPID PANEL
CHOLESTEROL TOTAL: 197 mg/dL (ref 100–199)
Chol/HDL Ratio: 3.8 ratio units (ref 0.0–4.4)
HDL: 52 mg/dL (ref >39)
LDL Calculated: 127 mg/dL — ABNORMAL HIGH (ref 0–99)
Triglycerides: 88 mg/dL (ref 0–149)
VLDL CHOLESTEROL CAL: 18 mg/dL (ref 5–40)

## 2014-02-17 LAB — TSH+FREE T4
FREE T4: 0.88 ng/dL (ref 0.82–1.77)
TSH: 2.35 u[IU]/mL (ref 0.450–4.500)

## 2014-02-17 LAB — HEMOGLOBIN A1C
ESTIMATED AVERAGE GLUCOSE: 117 mg/dL
HEMOGLOBIN A1C: 5.7 % — AB (ref 4.8–5.6)

## 2014-03-09 ENCOUNTER — Ambulatory Visit
Admission: RE | Admit: 2014-03-09 | Discharge: 2014-03-09 | Disposition: A | Discharge status: Home / Self Care | Payer: Medicare Other | Source: Ambulatory Visit | Attending: Neurology | Admitting: Neurology

## 2014-03-09 DIAGNOSIS — H5310 Unspecified subjective visual disturbances: Secondary | ICD-10-CM

## 2014-03-09 DIAGNOSIS — H34212 Partial retinal artery occlusion, left eye: Secondary | ICD-10-CM

## 2014-03-13 NOTE — Progress Notes (Signed)
Quick Note:  I called pt and relayed the MRI brain results (no strokes), keep appt 03-28-14 at 1045 (be here) for 1100 appt. Pt verbalized understanding. ______

## 2014-03-28 ENCOUNTER — Encounter: Payer: Self-pay | Admitting: Neurology

## 2014-03-28 ENCOUNTER — Ambulatory Visit (INDEPENDENT_AMBULATORY_CARE_PROVIDER_SITE_OTHER): Payer: Medicare Other | Admitting: Neurology

## 2014-03-28 ENCOUNTER — Encounter (INDEPENDENT_AMBULATORY_CARE_PROVIDER_SITE_OTHER): Payer: Self-pay

## 2014-03-28 VITALS — BP 161/84 | HR 74 | Wt 122.8 lb

## 2014-03-28 DIAGNOSIS — H34212 Partial retinal artery occlusion, left eye: Secondary | ICD-10-CM

## 2014-03-28 DIAGNOSIS — E785 Hyperlipidemia, unspecified: Secondary | ICD-10-CM | POA: Insufficient documentation

## 2014-03-28 NOTE — Patient Instructions (Signed)
-   will do MRA head to evaluate brain vessels - continue ASA and lipitor for stroke prevention - you have appointment with PCP this pm and please discuss with your PCP for mildly low EF - follow up with PCP for stroke risk factor modification - check BP at home and record - RTC in 6 months.

## 2014-03-28 NOTE — Progress Notes (Signed)
STROKE NEUROLOGY FOLLOW UP NOTE  NAME: Dana Pruitt DOB: 04/01/1941  REASON FOR VISIT: stroke follow up HISTORY FROM: pt and chart  Today we had the pleasure of seeing Dana Pruitt in follow-up at our Neurology Clinic. Pt was accompanied by no one.   History Summary Dana Pruitt is a 73 y.o. 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 She was at her normal health and went to see ophthalmologist for annual eye check. She was told that she had hollenhorst plaque in her left eye. PCP ordered CUS and 2D echo for her and requested neurology referral. No vision loss, double vision, visual field cut, etc.  She has breast cancer s/p lumpectomy and radiation in 2013 and started on letrozole in 02/2010. She will finish off course of letrozole in 02/2015. She denies any metastasis.  No DM history, her BP up and down at home, not on any meds for HTN, HLD 272 cholestrol last check, and quit smoking 25 years ago. Alcohol occasional. No illicit drug  FH include father had mini strokes with no deficit, died at 32s. Mom mini strokes at 14s and died at 75. Sister healthy.  Interval History During the interval time, the patient has been doing well. No acute issues. She had MRI brain done which did not show acute or chronic strokes. Carotid doppler showed no significant stenosis b/l carotid arteries. LDL 127 and she is on lipitor now. 2D echo showed EF 35-40%. CTA head and neck were not done.   Her BP today is high in clinic 161/84. She is not checking BP at home now.  REVIEW OF SYSTEMS: Full 14 system review of systems performed and notable only for those listed below and in HPI above, all others are negative:  Constitutional: N/A  Cardiovascular: N/A  Ear/Nose/Throat: N/A  Skin: N/A  Eyes: N/A  Respiratory: N/A  Gastroitestinal: N/A  Genitourinary: N/A Hematology/Lymphatic: N/A  Endocrine: N/A    Musculoskeletal: N/A  Allergy/Immunology: N/A  Neurological: N/A  Psychiatric: N/A  The following represents the patient's updated allergies and side effects list: No Known Allergies  Labs since last visit of relevance include the following: Results for orders placed in visit on 02/15/14  TSH+FREE T4      Result Value Ref Range   TSH 2.350  0.450 - 4.500 uIU/mL   Free T4 0.88  0.82 - 1.77 ng/dL  LIPID PANEL      Result Value Ref Range   Cholesterol, Total 197  100 - 199 mg/dL   Triglycerides 88  0 - 149 mg/dL   HDL 52  >39 mg/dL   VLDL Cholesterol Cal 18  5 - 40 mg/dL   LDL Calculated 127 (*) 0 - 99 mg/dL   Chol/HDL Ratio 3.8  0.0 - 4.4 ratio units  HEMOGLOBIN A1C      Result Value Ref Range   Hemoglobin A1C 5.7 (*) 4.8 - 5.6 %   Estimated average glucose 409    BASIC METABOLIC PANEL      Result Value Ref Range   Glucose 96  65 - 99 mg/dL   BUN 18  8 - 27 mg/dL   Creatinine, Ser 1.06 (*) 0.57 - 1.00 mg/dL   GFR calc non Af Amer 53 (*) >59 mL/min/1.73   GFR calc Af Amer 61  >59 mL/min/1.73   BUN/Creatinine Ratio 17  11 - 26   Sodium  144  134 - 144 mmol/L   Potassium 4.3  3.5 - 5.2 mmol/L   Chloride 105  97 - 108 mmol/L   CO2 23  18 - 29 mmol/L   Calcium 10.1  8.7 - 10.3 mg/dL    The neurologically relevant items on the patient's problem list were reviewed on today's visit.  Neurologic Examination  A problem focused neurological exam (12 or more points of the single system neurologic examination, vital signs counts as 1 point, cranial nerves count for 8 points) was performed.  Blood pressure 161/84, pulse 74, weight 122 lb 12.8 oz (55.702 kg).  General - Well nourished, well developed, in no apparent distress.  Ophthalmologic - Sharp disc margins OU.  Cardiovascular - Regular rate and rhythm with no murmur.  Mental Status -  Level of arousal and orientation to time, place, and person were intact. Language including expression, naming, repetition,  comprehension, reading, and writing was assessed and found intact. Attention span and concentration were normal. Recent and remote memory were intact. Fund of Knowledge was assessed and was intact.  Cranial Nerves II - XII - II - Visual field intact OU. III, IV, VI - Extraocular movements intact. V - Facial sensation intact bilaterally. VII - Facial movement intact bilaterally. VIII - Hearing & vestibular intact bilaterally. X - Palate elevates symmetrically. XI - Chin turning & shoulder shrug intact bilaterally. XII - Tongue protrusion intact.  Motor Strength - The patient's strength was normal in all extremities and pronator drift was absent.  Bulk was normal and fasciculations were absent.   Motor Tone - Muscle tone was assessed at the neck and appendages and was normal.  Reflexes - The patient's reflexes were normal in all extremities and she had no pathological reflexes.  Sensory - Light touch, temperature/pinprick, vibration and proprioception, and Romberg testing were assessed and were normal.    Coordination - The patient had normal movements in the hands and feet with no ataxia or dysmetria.  Tremor was absent.  Gait and Station - The patient's transfers, posture, gait, station, and turns were observed as normal.  Data reviewed: I personally reviewed the images and agree with the radiology interpretations.  MRI brain Abnormal MRI scan of the brain showing moderate changes of chronic microvascular ischemia and mild changes of generalized cerebral atrophy and chronic paranasal sinusitis.  CUS - Bilateral: 1-39% ICA stenosis. Vertebral artery flow is antegrade.  2D echo - Left ventricle: The cavity size was normal. Wall thickness was normal. Systolic function was moderately reduced. The estimated ejection fraction was in the range of 35% to 40%. Diffuse hypokinesis. Doppler parameters are consistent with abnormal left ventricular relaxation (grade 1 diastolic  dysfunction). - Aortic valve: No evidence of vegetation. There was moderate regurgitation. - Mitral valve: There was mild regurgitation. - Atrial septum: No defect or patent foramen ovale was identified.  Component     Latest Ref Rng 02/16/2014  Cholesterol, Total     100 - 199 mg/dL 197  Triglycerides     0 - 149 mg/dL 88  HDL     >39 mg/dL 52  VLDL Cholesterol Cal     5 - 40 mg/dL 18  LDL (calc)     0 - 99 mg/dL 127 (H)  Total CHOL/HDL Ratio     0.0 - 4.4 ratio units 3.8  TSH     0.450 - 4.500 uIU/mL 2.350  Free T4     0.82 - 1.77 ng/dL 0.88  Hemoglobin A1C  4.8 - 5.6 % 5.7 (H)  Estimated average glucose      117   Assessment: As you may recall, she is a 73 y.o. Caucasian female with PMH of breast cancer s/p lumpectomy and radiatoin on letrozole followed up in clinic for asymptomatic hollenhorst plaque in left eye. Pt completley no symptoms. hollenhorst usually associated with cartoid plaque. Although cancer induced hypercoagulable state is in DDx, but unlikely since it will not cause hollenhurst plaque. MRI brain and CUS unremarkable. LDL 127 on lipitor. CTA head and neck not done, will cancel and do MRA head instead. EF 35-40%, may need cardiology follow up.  Plan:  - MRA head - continue ASA and lipitor for stroke prevention - monitor BP at home - follow up with PCP for stroke risk factor modification and consider cardiology consult for low EF 35-40% - RTC in 6 months  Orders Placed This Encounter  Procedures  . MR MRA HEAD WO CONTRAST    Standing Status: Future     Number of Occurrences:      Standing Expiration Date: 05/30/2015    Order Specific Question:  Reason for Exam (SYMPTOM  OR DIAGNOSIS REQUIRED)    Answer:  stroke work up to evaluate intracranial vessles    Order Specific Question:  Preferred imaging location?    Answer:  Internal    Order Specific Question:  Does the patient have a pacemaker or implanted devices?    Answer:  No    Order Specific  Question:  What is the patient's sedation requirement?    Answer:  No Sedation    No orders of the defined types were placed in this encounter.    Patient Instructions  - will do MRA head to evaluate brain vessels - continue ASA and lipitor for stroke prevention - you have appointment with PCP this pm and please discuss with your PCP for mildly low EF - follow up with PCP for stroke risk factor modification - check BP at home and record - RTC in 6 months.   Rosalin Hawking, MD PhD [facility] [address] [place], [place] [address] [phone]

## 2014-03-29 ENCOUNTER — Telehealth: Payer: Self-pay | Admitting: Cardiovascular Disease

## 2014-03-30 NOTE — Telephone Encounter (Signed)
Closed Encounter  °

## 2014-04-05 ENCOUNTER — Telehealth: Payer: Self-pay | Admitting: Cardiovascular Disease

## 2014-04-05 NOTE — Telephone Encounter (Signed)
Received 7 pages of records from [facility] --Dr Shelia Media for new patient appointment with Dr [name] on 05/11/14.  Records given to [facility] Records) for Dr Kennon Holter schedule on 05/11/14  lp

## 2014-04-06 ENCOUNTER — Ambulatory Visit
Admission: RE | Admit: 2014-04-06 | Discharge: 2014-04-06 | Disposition: A | Discharge status: Home / Self Care | Payer: Medicare Other | Source: Ambulatory Visit | Attending: Neurology | Admitting: Neurology

## 2014-04-06 DIAGNOSIS — H34212 Partial retinal artery occlusion, left eye: Secondary | ICD-10-CM

## 2014-04-17 ENCOUNTER — Other Ambulatory Visit: Payer: Self-pay | Admitting: *Deleted

## 2014-04-17 DIAGNOSIS — Z780 Asymptomatic menopausal state: Secondary | ICD-10-CM

## 2014-04-17 DIAGNOSIS — C50419 Malignant neoplasm of upper-outer quadrant of unspecified female breast: Secondary | ICD-10-CM

## 2014-04-17 DIAGNOSIS — Z79811 Long term (current) use of aromatase inhibitors: Secondary | ICD-10-CM

## 2014-04-23 ENCOUNTER — Encounter: Payer: Self-pay | Admitting: Neurology

## 2014-04-30 ENCOUNTER — Ambulatory Visit: Payer: Medicare Other | Admitting: Cardiology

## 2014-05-10 ENCOUNTER — Telehealth: Payer: Self-pay | Admitting: Oncology

## 2014-05-10 ENCOUNTER — Other Ambulatory Visit: Payer: Self-pay | Admitting: *Deleted

## 2014-05-10 DIAGNOSIS — C50419 Malignant neoplasm of upper-outer quadrant of unspecified female breast: Secondary | ICD-10-CM

## 2014-05-10 NOTE — Telephone Encounter (Signed)
pt cld wanted to r/s lab-r/s cld & spoke w/pt and gave pt time & date

## 2014-05-11 ENCOUNTER — Ambulatory Visit (INDEPENDENT_AMBULATORY_CARE_PROVIDER_SITE_OTHER): Payer: Medicare Other | Admitting: Cardiovascular Disease

## 2014-05-11 ENCOUNTER — Other Ambulatory Visit: Payer: Medicare Other

## 2014-05-11 ENCOUNTER — Other Ambulatory Visit (HOSPITAL_BASED_OUTPATIENT_CLINIC_OR_DEPARTMENT_OTHER): Payer: Medicare Other

## 2014-05-11 ENCOUNTER — Encounter: Payer: Self-pay | Admitting: Cardiovascular Disease

## 2014-05-11 VITALS — BP 140/82 | HR 70 | Ht 64.0 in | Wt 123.0 lb

## 2014-05-11 DIAGNOSIS — C50419 Malignant neoplasm of upper-outer quadrant of unspecified female breast: Secondary | ICD-10-CM

## 2014-05-11 DIAGNOSIS — E785 Hyperlipidemia, unspecified: Secondary | ICD-10-CM

## 2014-05-11 DIAGNOSIS — Z79899 Other long term (current) drug therapy: Secondary | ICD-10-CM

## 2014-05-11 DIAGNOSIS — C50811 Malignant neoplasm of overlapping sites of right female breast: Secondary | ICD-10-CM

## 2014-05-11 DIAGNOSIS — H34212 Partial retinal artery occlusion, left eye: Secondary | ICD-10-CM

## 2014-05-11 DIAGNOSIS — I519 Heart disease, unspecified: Secondary | ICD-10-CM

## 2014-05-11 LAB — CBC WITH DIFFERENTIAL/PLATELET
BASO%: 0.4 % (ref 0.0–2.0)
BASOS ABS: 0 10*3/uL (ref 0.0–0.1)
EOS ABS: 0.3 10*3/uL (ref 0.0–0.5)
EOS%: 4.9 % (ref 0.0–7.0)
HEMATOCRIT: 38.8 % (ref 34.8–46.6)
HEMOGLOBIN: 13.3 g/dL (ref 11.6–15.9)
LYMPH#: 1.3 10*3/uL (ref 0.9–3.3)
LYMPH%: 24.3 % (ref 14.0–49.7)
MCH: 31.5 pg (ref 25.1–34.0)
MCHC: 34.3 g/dL (ref 31.5–36.0)
MCV: 91.9 fL (ref 79.5–101.0)
MONO#: 0.2 10*3/uL (ref 0.1–0.9)
MONO%: 4.5 % (ref 0.0–14.0)
NEUT%: 65.9 % (ref 38.4–76.8)
NEUTROS ABS: 3.5 10*3/uL (ref 1.5–6.5)
Platelets: 190 10*3/uL (ref 145–400)
RBC: 4.22 10*6/uL (ref 3.70–5.45)
RDW: 13 % (ref 11.2–14.5)
WBC: 5.4 10*3/uL (ref 3.9–10.3)

## 2014-05-11 LAB — COMPREHENSIVE METABOLIC PANEL (CC13)
ALBUMIN: 4.2 g/dL (ref 3.5–5.0)
ALT: 14 U/L (ref 0–55)
ANION GAP: 10 meq/L (ref 3–11)
AST: 16 U/L (ref 5–34)
Alkaline Phosphatase: 49 U/L (ref 40–150)
BUN: 20.9 mg/dL (ref 7.0–26.0)
CALCIUM: 10.6 mg/dL — AB (ref 8.4–10.4)
CHLORIDE: 107 meq/L (ref 98–109)
CO2: 25 meq/L (ref 22–29)
CREATININE: 1.3 mg/dL — AB (ref 0.6–1.1)
GLUCOSE: 134 mg/dL (ref 70–140)
POTASSIUM: 3.6 meq/L (ref 3.5–5.1)
Sodium: 143 mEq/L (ref 136–145)
Total Bilirubin: 0.44 mg/dL (ref 0.20–1.20)
Total Protein: 6.9 g/dL (ref 6.4–8.3)

## 2014-05-11 MED ORDER — LISINOPRIL 2.5 MG PO TABS: 2.5000 mg | ORAL_TABLET | Freq: Every day | ORAL | Status: DC

## 2014-05-11 MED ORDER — CARVEDILOL 3.125 MG PO TABS: 3.1250 mg | ORAL_TABLET | Freq: Two times a day (BID) | ORAL | Status: DC

## 2014-05-11 NOTE — Assessment & Plan Note (Addendum)
Recent 2-D echocardiogram performed 02/16/14 revealed ejection fraction of 35-40% with normal LV size, wall thickness. Her 2-D echo does show moderate aortic regurgitation. This may also be continuing to her LV dysfunction. She did have breast cancer and was on chemotherapy which could also have contributed. She is completely asymptomatic. I am going to get an exercise Myoview stress test to rule out an ischemic etiology. The fact that her LV size is normal makes the aortic insufficiency has a concerning factor less likely. I'm going to start her on low-dose Coreg and lisinopril. We will recheck a 2-D echo in 3 months.

## 2014-05-11 NOTE — Patient Instructions (Signed)
  We will see you back in follow up in 1 month with [facility] for a blood pressure check and 3 months with Dr [name].   Dr [name] has ordered: 1.  Echocardiogram (to be done in 3 months). Echocardiography is a painless test that uses sound waves to create images of your heart. It provides your doctor with information about the size and shape of your heart and how well your heart's chambers and valves are working. This procedure takes approximately one hour. There are no restrictions for this procedure.  2. Exercise Myoview- this is a test that looks at the blood flow to your heart muscle.  It takes approximately 2 1/2 hours. Please follow instruction sheet, as given.  3. Start Lisinopril 2.5mg  daily  4. Start Coreg 3.125mg  twice a day  5. Your physician recommends that you return for lab work in: 3 weeks

## 2014-05-11 NOTE — Progress Notes (Signed)
05/11/2014 Dana Pruitt   07/11/1940  [id number]  Primary Physician Horatio Pel, MD Primary Cardiologist: Lorretta Harp MD Renae Gloss   HPI:  Dana Pruitt is a delightful 73 year old thin-appearing married Caucasian female mother of 2, grandmother of 2 grandchildren whose mother Dana Pruitt was also a patient of mine and died at the age of 31. She was referred by Dr. Deland Pretty for cardiovascular evaluation because of recent echocardiogram performed 02/16/14 that showed moderate left ventricular dysfunction with an ejection fraction of 35-40% and moderate aortic insufficiency. Her cardiovascular risk factor profile is remarkable for remote tobacco abuse having quit in 1990 and treated hyperlipidemia. There is no family history of heart disease. She has never had a heart attack or stroke and denies chest pain or shortness of breath. She did have a Hollenhorst plaque documented by ophthalmologic exam several months ago. Carotid Dopplers performed since that time were normal. The plaque is since resolved. A 2-D echo was performed that showed an EF of 35-40% with moderate aortic insufficiency. LV size is normal and there was no LVH. She did have breast cancer documented by lumpectomy in 2013 and had chemotherapy and radiation therapy. Her oncologist was Dr. Letta Pate . She is on Femara .   Current Outpatient Prescriptions  Medication Sig Dispense Refill  . acyclovir (ZOVIRAX) 200 MG capsule Take 2 capsules (400 mg total) by mouth 5 (five) times daily. (Patient taking differently: Take 400 mg by mouth 5 (five) times daily as needed. ) 20 capsule 0  . aspirin 81 MG tablet Take 81 mg by mouth daily.    [name] atorvastatin (LIPITOR) 20 MG tablet Take 29 mg by mouth daily.    . Calcium Carbonate-Vitamin D (CALCIUM 500 + D) 500-125 MG-UNIT TABS Take 1 tablet by mouth 2 (two) times daily.     . Cholecalciferol (VITAMIN D PO) Take 1 tablet by mouth daily. 1000 IUs daily    .  Lansoprazole (PREVACID 24HR PO) Take 1 capsule by mouth every morning.    [name] letrozole (FEMARA) 2.5 MG tablet Take 1 tablet (2.5 mg total) by mouth daily. 90 tablet 5  . loratadine (CLARITIN) 10 MG tablet Take 10 mg by mouth daily as needed.    . sertraline (ZOLOFT) 100 MG tablet Take 200 mg by mouth every morning.    . carvedilol (COREG) 3.125 MG tablet Take 1 tablet (3.125 mg total) by mouth 2 (two) times daily with a meal. 60 tablet 6  . lisinopril (ZESTRIL) 2.5 MG tablet Take 1 tablet (2.5 mg total) by mouth daily. 30 tablet 6   No current facility-administered medications for this visit.    No Known Allergies  History   Social History  . Marital Status: Married    Spouse Name: N/A    Number of Children: 2  . Years of Education: college   Occupational History  .     Social History Main Topics  . Smoking status: Former Smoker -- 2.00 packs/day for 25 years    Types: Cigarettes    Quit date: 06/30/1990  . Smokeless tobacco: Never Used  . Alcohol Use: 0.6 oz/week    1 Glasses of wine per week     Comment: 10/17/2012 "3-4 glasses of wine/month"  . Drug Use: No  . Sexual Activity: Yes    Birth Control/ Protection: Post-menopausal     Comment: Menarche age 79, G87,P2, Menopause in 30"s, No HRT Married   Other Topics Concern  . Not on file  Social History Narrative     Review of Systems: General: negative for chills, fever, night sweats or weight changes.  Cardiovascular: negative for chest pain, dyspnea on exertion, edema, orthopnea, palpitations, paroxysmal nocturnal dyspnea or shortness of breath Dermatological: negative for rash Respiratory: negative for cough or wheezing Urologic: negative for hematuria Abdominal: negative for nausea, vomiting, diarrhea, bright red blood per rectum, melena, or hematemesis Neurologic: negative for visual changes, syncope, or dizziness All other systems reviewed and are otherwise negative except as noted above.    Blood pressure  140/82, pulse 70, height 5\' 4"  (1.626 m), weight 123 lb (55.792 kg).  General appearance: alert and no distress Neck: no adenopathy, no carotid bruit, no JVD, supple, symmetrical, trachea midline and thyroid not enlarged, symmetric, no tenderness/mass/nodules Lungs: clear to auscultation bilaterally Heart: regular rate and rhythm, S1, S2 normal, no murmur, click, rub or gallop Extremities: extremities normal, atraumatic, no cyanosis or edema and 2+ pedal pulses bilaterally  EKG normal sinus rhythm at 70 without ST or T-wave changes. I personally reviewed this EKG  ASSESSMENT AND PLAN:   Hollenhorst plaque, left eye History of Hollenhorst plaque in left thigh demonstrate by her ophthalmologist which has since resolved on ophthalmologic exam. Carotid Doppler showed no evidence of atherosclerotic changes.  HLD (hyperlipidemia) On statin therapy, followed by her PCP.  Left ventricular dysfunction Recent 2-D echocardiogram performed 02/16/14 revealed ejection fraction of 35-40% with normal LV size, wall thickness. Her 2-D echo does show moderate aortic regurgitation. This may also be continuing to her LV dysfunction. She did have breast cancer and was on chemotherapy which could also have contributed. She is completely asymptomatic. I am going to get an exercise Myoview stress test to rule out an ischemic etiology. The fact that her LV size is normal makes the aortic insufficiency has a concerning factor less likely. I'm going to start her on low-dose Coreg and lisinopril. We will recheck a 2-D echo in 3 months.      Lorretta Harp MD FACP,FACC,FAHA, [facility] 05/11/2014 11:09 AM

## 2014-05-11 NOTE — Assessment & Plan Note (Signed)
On statin therapy, followed by her PCP 

## 2014-05-11 NOTE — Assessment & Plan Note (Signed)
History of Hollenhorst plaque in left thigh demonstrate by her ophthalmologist which has since resolved on ophthalmologic exam. Carotid Doppler showed no evidence of atherosclerotic changes.

## 2014-05-16 ENCOUNTER — Telehealth (HOSPITAL_COMMUNITY): Payer: Self-pay

## 2014-05-16 NOTE — Telephone Encounter (Signed)
Encounter complete. 

## 2014-05-22 ENCOUNTER — Ambulatory Visit (HOSPITAL_BASED_OUTPATIENT_CLINIC_OR_DEPARTMENT_OTHER): Payer: Medicare Other

## 2014-05-22 ENCOUNTER — Telehealth: Payer: Self-pay | Admitting: Oncology

## 2014-05-22 ENCOUNTER — Ambulatory Visit (HOSPITAL_BASED_OUTPATIENT_CLINIC_OR_DEPARTMENT_OTHER): Payer: Medicare Other | Admitting: Oncology

## 2014-05-22 VITALS — BP 140/74 | HR 76 | Temp 97.3°F | Resp 18 | Ht 64.0 in | Wt 123.2 lb

## 2014-05-22 DIAGNOSIS — M899 Disorder of bone, unspecified: Secondary | ICD-10-CM

## 2014-05-22 DIAGNOSIS — C50411 Malignant neoplasm of upper-outer quadrant of right female breast: Secondary | ICD-10-CM

## 2014-05-22 DIAGNOSIS — C50811 Malignant neoplasm of overlapping sites of right female breast: Secondary | ICD-10-CM

## 2014-05-22 DIAGNOSIS — C50419 Malignant neoplasm of upper-outer quadrant of unspecified female breast: Secondary | ICD-10-CM

## 2014-05-22 DIAGNOSIS — Z170 Estrogen receptor positive status [ER+]: Secondary | ICD-10-CM

## 2014-05-22 MED ORDER — DENOSUMAB 60 MG/ML ~~LOC~~ SOLN
60.0000 mg | Freq: Once | SUBCUTANEOUS | Status: AC
  Administered 2014-05-22: 60 mg via SUBCUTANEOUS
  Filled 2014-05-22: qty 1

## 2014-05-22 NOTE — Patient Instructions (Signed)
Denosumab injection What is this medicine? DENOSUMAB (den oh sue mab) slows bone breakdown. Prolia is used to treat osteoporosis in women after menopause and in men. Xgeva is used to prevent bone fractures and other bone problems caused by cancer bone metastases. Xgeva is also used to treat giant cell tumor of the bone. This medicine may be used for other purposes; ask your health care provider or pharmacist if you have questions. COMMON BRAND NAME(S): Prolia, XGEVA What should I tell my health care provider before I take this medicine? They need to know if you have any of these conditions: -dental disease -eczema -infection or history of infections -kidney disease or on dialysis -low blood calcium or vitamin D -malabsorption syndrome -scheduled to have surgery or tooth extraction -taking medicine that contains denosumab -thyroid or parathyroid disease -an unusual reaction to denosumab, other medicines, foods, dyes, or preservatives -pregnant or trying to get pregnant -breast-feeding How should I use this medicine? This medicine is for injection under the skin. It is given by a health care professional in a hospital or clinic setting. If you are getting Prolia, a special MedGuide will be given to you by the pharmacist with each prescription and refill. Be sure to read this information carefully each time. For Prolia, talk to your pediatrician regarding the use of this medicine in children. Special care may be needed. For Xgeva, talk to your pediatrician regarding the use of this medicine in children. While this drug may be prescribed for children as young as 13 years for selected conditions, precautions do apply. Overdosage: If you think you've taken too much of this medicine contact a poison control center or emergency room at once. Overdosage: If you think you have taken too much of this medicine contact a poison control center or emergency room at once. NOTE: This medicine is only for  you. Do not share this medicine with others. What if I miss a dose? It is important not to miss your dose. Call your doctor or health care professional if you are unable to keep an appointment. What may interact with this medicine? Do not take this medicine with any of the following medications: -other medicines containing denosumab This medicine may also interact with the following medications: -medicines that suppress the immune system -medicines that treat cancer -steroid medicines like prednisone or cortisone This list may not describe all possible interactions. Give your health care provider a list of all the medicines, herbs, non-prescription drugs, or dietary supplements you use. Also tell them if you smoke, drink alcohol, or use illegal drugs. Some items may interact with your medicine. What should I watch for while using this medicine? Visit your doctor or health care professional for regular checks on your progress. Your doctor or health care professional may order blood tests and other tests to see how you are doing. Call your doctor or health care professional if you get a cold or other infection while receiving this medicine. Do not treat yourself. This medicine may decrease your body's ability to fight infection. You should make sure you get enough calcium and vitamin D while you are taking this medicine, unless your doctor tells you not to. Discuss the foods you eat and the vitamins you take with your health care professional. See your dentist regularly. Brush and floss your teeth as directed. Before you have any dental work done, tell your dentist you are receiving this medicine. Do not become pregnant while taking this medicine or for 5 months after stopping   it. Women should inform their doctor if they wish to become pregnant or think they might be pregnant. There is a potential for serious side effects to an unborn child. Talk to your health care professional or pharmacist for more  information. What side effects may I notice from receiving this medicine? Side effects that you should report to your doctor or health care professional as soon as possible: -allergic reactions like skin rash, itching or hives, swelling of the face, lips, or tongue -breathing problems -chest pain -fast, irregular heartbeat -feeling faint or lightheaded, falls -fever, chills, or any other sign of infection -muscle spasms, tightening, or twitches -numbness or tingling -skin blisters or bumps, or is dry, peels, or red -slow healing or unexplained pain in the mouth or jaw -unusual bleeding or bruising Side effects that usually do not require medical attention (Report these to your doctor or health care professional if they continue or are bothersome.): -muscle pain -stomach upset, gas This list may not describe all possible side effects. Call your doctor for medical advice about side effects. You may report side effects to FDA at 1-800-FDA-1088. Where should I keep my medicine? This medicine is only given in a clinic, doctor's office, or other health care setting and will not be stored at home. NOTE: This sheet is a summary. It may not cover all possible information. If you have questions about this medicine, talk to your doctor, pharmacist, or health care provider.  2015, [employer]vier/Gold Standard. (2011-12-07 12:37:47)  

## 2014-05-22 NOTE — Progress Notes (Signed)
Dana Pruitt  [phone] [phone]  OFFICE PROGRESS NOTE   ID: Dana Pruitt   DOB: 29-Aug-1940  MR#: [id number]  OJJ#:[id number]   PCP: Dana Pel, MD GYN:  Dana Pruitt, M.D. RAD HBZ:JIRCV Dana Pruitt, M.D., Dana Pruitt, Dana Pruitt   HISTORY OF PRESENT ILLNESS: From Dana. Collier Salina Pruitt's the patient evaluation note dated 03/06/2010:  "This is a delightful 73 year old woman here today with her husband, Dana Pruitt, for evaluation and treatment of recent diagnosis of breast cancer.  This woman has been in good health.  She gets annual screening mammography up until two years ago.  She scheduled mammogram and about two days later felt a mass in the superior portion of the right breast.  She sought medical attention for this.  A mammogram and ultrasound performed on 02/25/2010 showed a mass measuring about 2.5 x 2.8 x 2.0 cm, right axillary lymph node with a fatty hilum was also seen.  Mammography showed this mass to be about 3.0 x 2.5 x 2.0 cm.  Biopsy was recommended, which was performed on 02/27/2010.  This showed intermediate-grade carcinoma, ER/PR positive 191% respectively, proliferative index 22%, HER-2 was not amplified with a ratio of 1.29.  She has been referred to see Dana. Margot Pruitt and has seen [place] today for consideration of neoadjuvant therapy.  She is due for an MRI scan this coming Friday."   Her subsequent history is as detailed below.   INTERVAL HISTORY: Dana Pruitt returns today for followup of her breast cancer. The interval history is significant for her having been found to have a Hollenhorst plaque, which initiated significant evaluation by cardiology and neurology. She tells me that they have found her heart rate drops into the 30s, however apparently a Holter has not been placed. She is scheduled for a perfusion scan tomorrow and had had MRA and brain MRI in October, showing moderate atherosclerotic changes but no internal carotid artery  stenosis or occlusion. There was no evidence of neoplasia.  REVIEW OF SYSTEMS: She continues on letrozole which she is tolerating well. Letrozole of course can slightly worse in the cholesterol profile, since it takes away the estrogen. It does not affect atherosclerosis directly. She is not as active as she used to be, but tells me she could easily walk a mile if she felt like it. She denies unusual headaches, has had no visual changes, no nausea or vomiting, no cough, phlegm production or pleurisy, no palpitations or chest pain or pressure. This been no change in bowel or bladder habits. She admits to anxiety. A detailed review of systems today was otherwise noncontributory  PAST MEDICAL HISTORY: Past Medical History  Diagnosis Date  . Anxiety   . Exertional shortness of breath     "recently" (10/17/2012)  . Anemia     "several times over the years" (10/17/2012)  . Carcinoma of breast treated with adjuvant hormone therapy 9/11    Femara  . Breast cancer 02/2010    right breast  . 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 Pneumonia     "that's why I'm here" (10/17/2012)  . Hyperlipidemia   . Hollenhorst plaque, left eye     apparently resolved at recent ophthalmology visit  . Left ventricular dysfunction   She has a history of mild depression.   PAST SURGICAL HISTORY: Past Surgical History  Procedure Laterality Date  . Cesarean section  1970; 1974  . Appendectomy  1974  . Dilation and curettage of uterus  1970's  .  Colonoscopy    . Breast lumpectomy with sentinel lymph node biopsy Right 07/03/11    Invasive Ductal Carcinoma;0/3 nodes negative,; ER,PR Positive, Her-2 Neg; Ki-67 22%  . Breast biopsy Right 02/27/10    Needle core Biopsy; Invasive Mammary; ER/PR Positive, Her-2 Neu negative, Ki-67 22%  . Tubal ligation  1974    FAMILY HISTORY Family History  Problem Relation Age of Onset  . Heart disease Mother   . Heart disease Maternal Grandmother   . Heart disease  Maternal Grandfather   . Cancer Father   The patient's father is deceased and her mother is alive and lives in a nursing facility.  She has one sister who is alive and well living in [place].     GYNECOLOGIC HISTORY: G2, P2.  Menarche at age 47.  Menopause in her 3s.  She is status post two C-sections and has not had any hormonal replacement therapy.  There is no other family history of breast cancer in the family   SOCIAL HISTORY: The patient has been married since 1968.  Her husband Dana Pruitt is a retired Chief Technology Officer. They have two adult sons, one of her sons lives in [place] and her other son lives in [place], [place].  She has two grandchildren.  In her spare time, she enjoys spending time with her friends and family , traveling, going to the beach, and gardening.   ADVANCED DIRECTIVES:  Not on file  HEALTH MAINTENANCE: History  Substance Use Topics  . Smoking status: Former Smoker -- 2.00 packs/day for 25 years    Types: Cigarettes    Quit date: 06/30/1990  . Smokeless tobacco: Never Used  . Alcohol Use: 0.6 oz/week    1 Glasses of wine per week     Comment: 10/17/2012 "3-4 glasses of wine/month"    Colonoscopy:   PAP:  02/21/2010 Bone density:  2013; we are requesting results from [facility] Lipid panel:  Not on file  No Known Allergies  Current Outpatient Prescriptions  Medication Sig Dispense Refill  . acyclovir (ZOVIRAX) 200 MG capsule Take 2 capsules (400 mg total) by mouth 5 (five) times daily. (Patient taking differently: Take 400 mg by mouth 5 (five) times daily as needed. ) 20 capsule 0  . aspirin 81 MG tablet Take 81 mg by mouth daily.    [name] atorvastatin (LIPITOR) 20 MG tablet Take 29 mg by mouth daily.    . Calcium Carbonate-Vitamin D (CALCIUM 500 + D) 500-125 MG-UNIT TABS Take 1 tablet by mouth 2 (two) times daily.     . carvedilol (COREG) 3.125 MG tablet Take 1 tablet (3.125 mg total) by mouth 2 (two) times daily with a meal. 60 tablet 6  .  Cholecalciferol (VITAMIN D PO) Take 1 tablet by mouth daily. 1000 IUs daily    . Lansoprazole (PREVACID 24HR PO) Take 1 capsule by mouth every morning.    [name] letrozole (FEMARA) 2.5 MG tablet Take 1 tablet (2.5 mg total) by mouth daily. 90 tablet 5  . lisinopril (ZESTRIL) 2.5 MG tablet Take 1 tablet (2.5 mg total) by mouth daily. 30 tablet 6  . loratadine (CLARITIN) 10 MG tablet Take 10 mg by mouth daily as needed.    . sertraline (ZOLOFT) 100 MG tablet Take 200 mg by mouth every morning.     No current facility-administered medications for this visit.    OBJECTIVE: Middle-aged white woman in no acute distress Filed Vitals:   05/22/14 1032  BP: 140/74  Pulse: 76  Temp:  97.3 F (36.3 C)  Resp: 18     Body mass index is 21.14 kg/(m^2).      ECOG FS: 0 - Asymptomatic  Sclerae unicteric, pupils equal and reactive Oropharynx clear and moist, dentition in good repair No cervical or supraclavicular adenopathy Lungs no rales or rhonchi Heart regular rate and rhythm, no murmur appreciated Abd soft, nontender, positive bowel sounds MSK no focal spinal tenderness, no upper extremity lymphedema Neuro: nonfocal, well oriented, appropriate affect Breasts: The right breast is status post lumpectomy and radiation. There is no evidence of local recurrence. The right axilla is benign. The left breast is unremarkable    LAB RESULTS: Lab Results  Component Value Date   WBC 5.4 05/11/2014   NEUTROABS 3.5 05/11/2014   HGB 13.3 05/11/2014   HCT 38.8 05/11/2014   MCV 91.9 05/11/2014   PLT 190 05/11/2014      Chemistry      Component Value Date/Time   NA 143 05/11/2014 1421   NA 144 02/16/2014 0826   NA 137 10/20/2012 0600   K 3.6 05/11/2014 1421   K 4.3 02/16/2014 0826   CL 105 02/16/2014 0826   CL 105 03/31/2012 1410   CO2 25 05/11/2014 1421   CO2 23 02/16/2014 0826   BUN 20.9 05/11/2014 1421   BUN 18 02/16/2014 0826   BUN 9 10/20/2012 0600   CREATININE 1.3* 05/11/2014 1421    CREATININE 1.06* 02/16/2014 0826      Component Value Date/Time   CALCIUM 10.6* 05/11/2014 1421   CALCIUM 10.1 02/16/2014 0826   ALKPHOS 49 05/11/2014 1421   ALKPHOS 36* 08/20/2011 0934   AST 16 05/11/2014 1421   AST 15 08/20/2011 0934   ALT 14 05/11/2014 1421   ALT 13 08/20/2011 0934   BILITOT 0.44 05/11/2014 1421   BILITOT 0.5 08/20/2011 0934      Lab Results  Component Value Date   LABCA2 30 11/12/2010    Urinalysis    Component Value Date/Time   COLORURINE YELLOW 10/17/2012 1738   APPEARANCEUR CLEAR 10/17/2012 1738   LABSPEC 1.015 10/17/2012 1738   PHURINE 6.5 10/17/2012 1738   GLUCOSEU NEGATIVE 10/17/2012 1738   HGBUR NEGATIVE 10/17/2012 1738   BILIRUBINUR NEGATIVE 10/17/2012 1738   KETONESUR NEGATIVE 10/17/2012 1738   PROTEINUR 30* 10/17/2012 1738   UROBILINOGEN 0.2 10/17/2012 1738   NITRITE NEGATIVE 10/17/2012 1738   LEUKOCYTESUR NEGATIVE 10/17/2012 1738    STUDIES: No results found.  PATIENT NAME: Dana Pruitt DOB: 1940-07-20 MRN: [id number]  ORDERING CLINICIAN: Rosalin Hawking, MD PhD  CLINICAL HISTORY: 73 year old female with Hollenhorst plaque in left eye.  EXAM: MRA head (without)  TECHNIQUE: MR angiogram of the head was obtained utilizing 3D time of flight sequences from below the vertebrobasilar junction up to the intracranial vasculature without contrast. Computerized reconstructions were obtained. CONTRAST: no IMAGING SITE: [facility] [address] (1.5 Tesla MRI)    FINDINGS:  This study is of adequate technical quality. Flow signal of the bilateral internal carotid arteries have no stenosis. The bilateral middle and anterior cerebral arteries have no stenosis. Mild atherosclerotic narrowing in the M2/M3 branches of the left middle cerebral artery. The bilateral vertebral, basilar, bilateral posterior cerebral arteries have no stenosis, except moderate narrowing in the distal left posterior cerebral artery branches. No aneurysmal  dilatations are seen.   IMPRESSION:  Mildly abnormal MRA head (without) demonstrating: 1. Moderate atherosclerotic narrowing in the distal left posterior cerebral artery branches. 2. Mild atherosclerotic narrowing  in the M2/M3 branches of the left middle cerebral artery. 3. No internal carotid artery stenosis or occlusion. Remainder of medium-large sized vessels unremarkable.    ORDERING CLINICIAN: Dr Erlinda Pruitt CLINICAL HISTORY: 62 year patient with vision loss COMPARISON FILMS: none EXAM: MRI Brain wo TECHNIQUE: MRI of the brain without contrast was obtained utilizing 5 mm axial slices with T1, T2, T2 flair, T2 star gradient echo and diffusion weighted views. T1 sagittal and T2 coronal views were obtained. CONTRAST: none IMAGING SITE: [facility]  FINDINGS:  The brain parenchyma shows moderate scattered periventricular subcortical and brainstem white matter hyperintensities secondary to chronic microvascular ischemia and mild degree of generalized cerebral atrophy. The paranasal sinuses show mild chronic inflammatory changesNo abnormal lesions are seen on diffusion-weighted views to suggest acute ischemia. The cortical sulci, fissures and cisterns are normal in size and appearance. Lateral, third and fourth ventricle are normal in size and appearance. No extra-axial fluid collections are seen. No evidence of mass effect or midline shift. On sagittal views the posterior fossa, pituitary gland and corpus callosum are unremarkable. No evidence of intracranial hemorrhage on gradient-echo views. The orbits and their contents, paranasal sinuses and calvarium are unremarkable. Intracranial flow voids are present.     IMPRESSION: Abnormal MRI scan of the brain showing moderate changes of chronic microvascular ischemia and mild changes of generalized cerebral atrophy and chronic paranasal sinusitis.   ASSESSMENT: 73 y.o.[place] woman: 1.  Status post right breast upper outer quadrant  biopsy 02/27/2010 for a clinical T2 N0, stage IIA invasive ductal carcinoma, grade 2, ER 100%, PR 91%, Ki-67 22%, HER-2/neu nonamplified  2.  Oncotype DX score of 13 predicted and outside the breast risk of recurrence within 10 years of 8% if the patient's only systemic treatment is tamoxifen for 5 years   3.  started letrozole neoadjuvantly in 02/2010.  4.  Status post right lumpectomy with right axillary node sentinel biopsy on 07/03/2011 for a ypT2, ypN0, stage IIA  invasive ductal carcinoma, grade 2, estrogen receptor 100% positive, progesterone receptor 13% positive, repeat HER-2 again negative  5. status post adjuvant radiation therapy from 08/05/2011 through 09/02/2011.    6. To continue letrozole through September 2016   7. osteopenia, on Prolia every 6 months  PLAN: From a breast cancer point of view Dana Pruitt is doing fine. We are continuing the anastrozole as before and she will complete 5 years September 2016. She will receive denosumab today and 6 months from now. She will continue to see her dentist twice a year. I am also scheduling her for repeat mammography in January, which is when her yearly study is due.  She just had a bone density study but we do not yet have those results.  I do not think the letrozole is related to her atherosclerotic problems except for the fact that of course lower estrogen mains slightly worse cholesterol profile. She is now on  Lipitor, which should more than correct that. However if she needed to come off letrozole for a few months that certainly would not be a major issue. We would simply added at the end.  She is going to see me 6 months from now but she will call for the bone density results.  Sharen has a good understanding of the overall plan. She agrees with it. She knows the goal of treatment in her case is cure. She will call with any problems that may develop before her next visit here.  Dana Cruel, MD  05/22/2014, 11:08 AM

## 2014-05-22 NOTE — Telephone Encounter (Signed)
m, °

## 2014-05-23 ENCOUNTER — Ambulatory Visit (HOSPITAL_COMMUNITY)
Admission: RE | Admit: 2014-05-23 | Discharge: 2014-05-23 | Disposition: A | Discharge status: Home / Self Care | Payer: Medicare Other | Source: Ambulatory Visit | Attending: Cardiovascular Disease | Admitting: Cardiovascular Disease

## 2014-05-23 DIAGNOSIS — Z87891 Personal history of nicotine dependence: Secondary | ICD-10-CM | POA: Diagnosis not present

## 2014-05-23 DIAGNOSIS — I10 Essential (primary) hypertension: Secondary | ICD-10-CM | POA: Diagnosis not present

## 2014-05-23 DIAGNOSIS — I519 Heart disease, unspecified: Secondary | ICD-10-CM | POA: Diagnosis present

## 2014-05-23 DIAGNOSIS — Z8249 Family history of ischemic heart disease and other diseases of the circulatory system: Secondary | ICD-10-CM | POA: Insufficient documentation

## 2014-05-23 DIAGNOSIS — J449 Chronic obstructive pulmonary disease, unspecified: Secondary | ICD-10-CM | POA: Insufficient documentation

## 2014-05-23 DIAGNOSIS — Z6821 Body mass index (BMI) 21.0-21.9, adult: Secondary | ICD-10-CM | POA: Diagnosis not present

## 2014-05-23 MED ORDER — TECHNETIUM TC 99M SESTAMIBI GENERIC - CARDIOLITE
30.4000 | Freq: Once | INTRAVENOUS | Status: AC | PRN
  Administered 2014-05-23: 30 via INTRAVENOUS

## 2014-05-23 MED ORDER — TECHNETIUM TC 99M SESTAMIBI GENERIC - CARDIOLITE
10.6000 | Freq: Once | INTRAVENOUS | Status: AC | PRN
  Administered 2014-05-23: 11 via INTRAVENOUS

## 2014-05-23 NOTE — Procedures (Addendum)
[facility] [address] [place] [id number] [phone]  Cardiology Nuclear Med Study  Dana Pruitt is a 73 y.o. female     MRN : [id number]     DOB: 1940-09-25  Procedure Date: 05/23/2014  Nuclear Med Background Indication for Stress Test:  Evaluation for Ischemia History:  COPD and Left ventricular dysfunction;No prior NUC MPI for comparison;ECHO pn 02/16/2014-LVEF=35-40% Cardiac Risk Factors: Family History - CAD, History of Smoking, Hypertension and Lipids  Symptoms:  DOE and Fatigue   Nuclear Pre-Procedure Caffeine/Decaff Intake:  1:00am NPO After: 11am   IV Site: R Forearm  IV 0.9% NS with Angio Cath:  22g  Chest Size (in):  n/a IV Started by: Rolene Course, RN  Height: 5\' 4"  (1.626 m)  Cup Size: DD  BMI:  Body mass index is 21.1 kg/(m^2). Weight:  123 lb (55.792 kg)   Tech Comments:  n/a    Nuclear Med Study 1 or 2 day study: 1 day  Stress Test Type:  Stress  Order Authorizing Provider:  Quay Burow, MD   Resting Radionuclide: Technetium 90m Sestamibi  Resting Radionuclide Dose: 10.6 mCi   Stress Radionuclide:  Technetium 53m Sestamibi  Stress Radionuclide Dose: 30.4 mCi           Stress Protocol Rest HR:68 Stress HR: 141  Rest BP:135/85 Stress BP: 210/93  Exercise Time (min): 5:36 METS: 7.00   Predicted Max HR: 148 bpm % Max HR: 95.27 bpm Rate Pressure Product: 29610  Dose of Adenosine (mg):  n/a Dose of Lexiscan: n/a mg  Dose of Atropine (mg): n/a Dose of Dobutamine: n/a mcg/kg/min (at max HR)  Stress Test Technologist: Mellody Memos, CCT Nuclear Technologist: Imagene Riches, CNMT   Rest Procedure:  Myocardial perfusion imaging was performed at rest 45 minutes following the intravenous administration of Technetium 54m Sestamibi. Stress Procedure:  The patient performed treadmill exercise using a Bruce  Protocol for 5 minutes 36 seconds. The patient stopped due to fatigue and shortness of  breath.  Patient denied any chest pain.  There were no significant ST-T wave changes.  Technetium 8m Sestamibi was injected IV at peak exercise and myocardial perfusion imaging was performed after a brief delay.  Transient Ischemic Dilatation (Normal <1.22):  1.07  QGS EDV:  101 ml QGS ESV:  55 ml LV Ejection Fraction: 46%  Rest ECG: NSR - Normal EKG  Stress ECG: No significant change from baseline ECG and There are scattered PVCs.  QPS Raw Data Images:  Normal; no motion artifact; normal heart/lung ratio. Stress Images:  There is mild apical thinning with normal uptake in other regions. Rest Images:  There is mild apical thinning with normal uptake in other regions. Subtraction (SDS):  No evidence of ischemia.  Impression Exercise Capacity:  Fair exercise capacity. BP Response:  Hypertensive blood pressure response. Clinical Symptoms:  No significant symptoms noted. ECG Impression:  There are scattered PVCs. Comparison with Prior Nuclear Study: No previous nuclear study performed  Overall Impression:  Intermediate risk stress nuclear study with small, mild intensity fixed apical thinning artifact, no ischemia..  LV Wall Motion:  Moderate global hypokinesis, EF 46%.  [name], MD, [facility] Board Certified in Nuclear Cardiology Attending Cardiologist [place] C, MD  05/23/2014 4:23 PM

## 2014-05-28 ENCOUNTER — Ambulatory Visit (INDEPENDENT_AMBULATORY_CARE_PROVIDER_SITE_OTHER): Payer: Medicare Other | Admitting: Cardiovascular Disease

## 2014-05-28 ENCOUNTER — Encounter: Payer: Self-pay | Admitting: Cardiovascular Disease

## 2014-05-28 VITALS — BP 140/76 | HR 77 | Ht 64.0 in | Wt 124.3 lb

## 2014-05-28 DIAGNOSIS — I519 Heart disease, unspecified: Secondary | ICD-10-CM

## 2014-05-28 MED ORDER — CARVEDILOL 6.25 MG PO TABS: 6.2500 mg | ORAL_TABLET | Freq: Two times a day (BID) | ORAL | Status: DC

## 2014-05-28 MED ORDER — LISINOPRIL 5 MG PO TABS: 5.0000 mg | ORAL_TABLET | Freq: Every day | ORAL | Status: DC

## 2014-05-28 NOTE — Patient Instructions (Signed)
  We will see you back in follow up in 4 months with Dr [name].   Dr [name] has ordered: 1.  Echocardiogram (to be done in 3 months). Echocardiography is a painless test that uses sound waves to create images of your heart. It provides your doctor with information about the size and shape of your heart and how well your heart's chambers and valves are working. This procedure takes approximately one hour. There are no restrictions for this procedure.  2. Increase Coreg to 6.25mg  twice a day  3. Increase Lisinopril to 5mg  daily

## 2014-05-28 NOTE — Assessment & Plan Note (Addendum)
Myoview stress test showed no evidence of ischemia or infarction. I'm going to titrate her carvedilol and her lisinopril, and recheck a 2-D echo in 3 months. I will see her back in 4 months for follow-up. At this point, she is asymptomatic and has no symptoms symptoms of congestive heart failure.

## 2014-05-28 NOTE — Progress Notes (Signed)
Ms Heslin returns today for follow-up of her Myoview stress test that was low risk without evidence of ischemia or infarction. She is completely asymptomatic with regards to her aortic insufficiency and LV dysfunction. She did not receive chemotherapy for her breast cancer but rather radiation therapy. I'm going to titrate her carvedilol and her lisinopril and will recheck a 2-D echo in 3 months, and we'll see her back after that and follow-up.  Lorretta Harp, M.D., [place], [facility], [place] [address] [place], [place]  [id number]  [phone] 05/28/2014 4:12 PM

## 2014-07-31 ENCOUNTER — Other Ambulatory Visit: Payer: Self-pay | Admitting: Pharmacist

## 2014-08-10 ENCOUNTER — Ambulatory Visit (HOSPITAL_COMMUNITY)
Admission: RE | Admit: 2014-08-10 | Discharge: 2014-08-10 | Disposition: A | Discharge status: Home / Self Care | Payer: Medicare Other | Source: Ambulatory Visit | Attending: Cardiovascular Disease | Admitting: Cardiovascular Disease

## 2014-08-10 DIAGNOSIS — Z87891 Personal history of nicotine dependence: Secondary | ICD-10-CM | POA: Insufficient documentation

## 2014-08-10 DIAGNOSIS — Z8249 Family history of ischemic heart disease and other diseases of the circulatory system: Secondary | ICD-10-CM | POA: Insufficient documentation

## 2014-08-10 DIAGNOSIS — I519 Heart disease, unspecified: Secondary | ICD-10-CM | POA: Diagnosis not present

## 2014-08-10 DIAGNOSIS — E785 Hyperlipidemia, unspecified: Secondary | ICD-10-CM | POA: Insufficient documentation

## 2014-08-10 NOTE — Progress Notes (Signed)
2D Echocardiogram Complete.  08/10/2014   Latrese Carolan, RDCS   Preliminary Technician Findings:  The presence of a perimembranous VSD cannot be ruled out.  There is severe Aortic Insufficiency which may be saturating the septum and appearing as a septal defect.

## 2014-08-13 ENCOUNTER — Telehealth: Payer: Self-pay | Admitting: *Deleted

## 2014-08-13 NOTE — Telephone Encounter (Signed)
"  Patient scheduled herself for a screening mammogram and needs a diagnostic per message.  Order faxed to [facility] is needed back by patient's 1:15 appointment."  Called collaborative nurse who reports faxed an order at this time.

## 2014-08-27 ENCOUNTER — Ambulatory Visit (HOSPITAL_COMMUNITY): Payer: Medicare Other

## 2014-09-05 ENCOUNTER — Ambulatory Visit (INDEPENDENT_AMBULATORY_CARE_PROVIDER_SITE_OTHER): Payer: Medicare Other | Admitting: Cardiovascular Disease

## 2014-09-05 ENCOUNTER — Encounter: Payer: Self-pay | Admitting: Cardiovascular Disease

## 2014-09-05 VITALS — BP 112/50 | HR 80 | Ht 64.0 in | Wt 124.0 lb

## 2014-09-05 DIAGNOSIS — Z79899 Other long term (current) drug therapy: Secondary | ICD-10-CM

## 2014-09-05 DIAGNOSIS — I351 Nonrheumatic aortic (valve) insufficiency: Secondary | ICD-10-CM

## 2014-09-05 DIAGNOSIS — E785 Hyperlipidemia, unspecified: Secondary | ICD-10-CM

## 2014-09-05 LAB — BASIC METABOLIC PANEL
BUN: 18 mg/dL (ref 6–23)
CALCIUM: 10.3 mg/dL (ref 8.4–10.5)
CHLORIDE: 106 meq/L (ref 96–112)
CO2: 22 mEq/L (ref 19–32)
CREATININE: 1.03 mg/dL (ref 0.50–1.10)
Glucose, Bld: 107 mg/dL — ABNORMAL HIGH (ref 70–99)
Potassium: 4.8 mEq/L (ref 3.5–5.3)
Sodium: 140 mEq/L (ref 135–145)

## 2014-09-05 NOTE — Progress Notes (Signed)
09/05/2014 Dana Pruitt   Oct 20, 1940  [id number]  Primary Physician Dana Pel, MD Primary Cardiologist: Dana Harp MD Dana Pruitt   HPI:  Dana Pruitt is a delightful 74 year old thin-appearing married Caucasian female mother of 2, grandmother of 2 grandchildren whose mother Dana Pruitt was also a patient of mine and died at the age of 23. She was referred by Dr. Deland Pruitt for cardiovascular evaluation because of echocardiogram performed 02/16/14 that showed moderate left ventricular dysfunction with an ejection fraction of 35-40% and moderate aortic insufficiency.I last saw her in the office 05/28/14  Her cardiovascular risk factor profile is remarkable for remote tobacco abuse having quit in 1990 and treated hyperlipidemia. There is no family history of heart disease. She has never had a heart attack or stroke and denies chest pain or shortness of breath. She did have a Hollenhorst plaque documented by ophthalmologic exam several months ago. Carotid Dopplers performed since that time were normal. The plaque is since resolved. A 2-D echo was performed that showed an EF of 35-40% with moderate aortic insufficiency. LV size is normal and there was no LVH. She did have breast cancer documented by lumpectomy in 2013 and had chemotherapy and radiation therapy. Her oncologist was Dr. Letta Pruitt . She is on Femara . I began her on low-dose carvedilol and repeated her 2-D echocardiogram on 08/10/14 revealed improvement in her LV function up to 45-50%. She continues to have moderate to severe AI with normal LV size (LV end-diastolic dimension of 47 mm). She only complains of mild dyspnea on exertion.   Current Outpatient Prescriptions  Medication Sig Dispense Refill  . acyclovir (ZOVIRAX) 200 MG capsule Take 2 capsules (400 mg total) by mouth 5 (five) times daily. (Patient taking differently: Take 400 mg by mouth 5 (five) times daily as needed. ) 20 capsule 0  .  atorvastatin (LIPITOR) 20 MG tablet Take 29 mg by mouth daily.    . carvedilol (COREG) 6.25 MG tablet Take 1 tablet (6.25 mg total) by mouth 2 (two) times daily with a meal. 60 tablet 11  . Cholecalciferol (VITAMIN D PO) Take 1 tablet by mouth daily. 1000 IUs daily    . Lansoprazole (PREVACID 24HR PO) Take 1 capsule by mouth every morning.    [name] letrozole (FEMARA) 2.5 MG tablet Take 1 tablet (2.5 mg total) by mouth daily. 90 tablet 5  . lisinopril (PRINIVIL,ZESTRIL) 5 MG tablet Take 1 tablet (5 mg total) by mouth daily. 30 tablet 11  . loratadine (CLARITIN) 10 MG tablet Take 10 mg by mouth daily as needed.    . sertraline (ZOLOFT) 100 MG tablet Take 200 mg by mouth every morning.     No current facility-administered medications for this visit.    No Known Allergies  History   Social History  . Marital Status: Married    Spouse Name: N/A  . Number of Children: 2  . Years of Education: college   Occupational History  .     Social History Main Topics  . Smoking status: Former Smoker -- 2.00 packs/day for 25 years    Types: Cigarettes    Quit date: 06/30/1990  . Smokeless tobacco: Never Used  . Alcohol Use: 0.6 oz/week    1 Glasses of wine per week     Comment: 10/17/2012 "3-4 glasses of wine/month"  . Drug Use: No  . Sexual Activity: Yes    Birth Control/ Protection: Post-menopausal     Comment: Menarche age 51, G64,P2,  Menopause in 50"s, No HRT Married   Other Topics Concern  . Not on file   Social History Narrative     Review of Systems: General: negative for chills, fever, night sweats or weight changes.  Cardiovascular: negative for chest pain, dyspnea on exertion, edema, orthopnea, palpitations, paroxysmal nocturnal dyspnea or shortness of breath Dermatological: negative for rash Respiratory: negative for cough or wheezing Urologic: negative for hematuria Abdominal: negative for nausea, vomiting, diarrhea, bright red blood per rectum, melena, or  hematemesis Neurologic: negative for visual changes, syncope, or dizziness All other systems reviewed and are otherwise negative except as noted above.    Blood pressure 112/50, pulse 80, height 5\' 4"  (1.626 m), weight 124 lb (56.246 kg).  General appearance: alert and no distress Neck: no adenopathy, no carotid bruit, no JVD, supple, symmetrical, trachea midline and thyroid not enlarged, symmetric, no tenderness/mass/nodules Lungs: clear to auscultation bilaterally Heart: 2/6 diastolic murmur at the left upper sternal border consistent with aortic insufficiency Extremities: extremities normal, atraumatic, no cyanosis or edema  EKG not performed today  ASSESSMENT AND PLAN:   Left ventricular dysfunction The patient has mild global LV dysfunction with an EF of 45-50% by recent 2-D echocardiography performed 08/10/14. She does have a left ventricular end-diastolic dimension of 47 mm and moderate to severe AI which has not changed since her prior echo performed 02/16/14 although her ejection fraction has improved from the 35-40% range. She is minimally symptomatic. She is on an ACE inhibitor and a beta blocker (lisinopril and Coreg).   HLD (hyperlipidemia) History of hyperlipidemia with her most recent lipid profile performed 02/16/14 revealing an LDL of 127 HDL of 52. She is on atorvastatin 20 mg a day.   Aortic insufficiency This Hannold has moderate to severe aortic insufficiency. Her LV size has remained normal and her ejection fraction has moderately improved from the 35-40% range to the 45-50% range. She'll he has mild dyspnea. Talked about aortic valve replacement at this point we will continue to follow her by semiannual 2-D echocardiograms. She should become more symptomatically and/or should her LV dilate or her EF declined and would consider aortic valve replacement.       Dana Harp MD FACP,FACC,FAHA, [facility] 09/05/2014 10:19 AM

## 2014-09-05 NOTE — Assessment & Plan Note (Signed)
The patient has mild global LV dysfunction with an EF of 45-50% by recent 2-D echocardiography performed 08/10/14. She does have a left ventricular end-diastolic dimension of 47 mm and moderate to severe AI which has not changed since her prior echo performed 02/16/14 although her ejection fraction has improved from the 35-40% range. She is minimally symptomatic. She is on an ACE inhibitor and a beta blocker (lisinopril and Coreg).

## 2014-09-05 NOTE — Patient Instructions (Signed)
  We will see you back in follow up in September with Dr [name].   Dr [name] has ordered: 1.  Echocardiogram (to be done in August). Echocardiography is a painless test that uses sound waves to create images of your heart. It provides your doctor with information about the size and shape of your heart and how well your heart's chambers and valves are working. This procedure takes approximately one hour. There are no restrictions for this procedure.  2. Blood work to be done today.

## 2014-09-05 NOTE — Assessment & Plan Note (Signed)
History of hyperlipidemia with her most recent lipid profile performed 02/16/14 revealing an LDL of 127 HDL of 52. She is on atorvastatin 20 mg a day.

## 2014-09-05 NOTE — Assessment & Plan Note (Signed)
This Waren has moderate to severe aortic insufficiency. Her LV size has remained normal and her ejection fraction has moderately improved from the 35-40% range to the 45-50% range. She'll he has mild dyspnea. Talked about aortic valve replacement at this point we will continue to follow her by semiannual 2-D echocardiograms. She should become more symptomatically and/or should her LV dilate or her EF declined and would consider aortic valve replacement.

## 2014-09-10 ENCOUNTER — Encounter: Payer: Self-pay | Admitting: *Deleted

## 2014-09-27 ENCOUNTER — Ambulatory Visit: Payer: Medicare Other | Admitting: Neurology

## 2014-11-26 ENCOUNTER — Other Ambulatory Visit: Payer: Self-pay | Admitting: *Deleted

## 2014-11-26 DIAGNOSIS — C50411 Malignant neoplasm of upper-outer quadrant of right female breast: Secondary | ICD-10-CM

## 2014-11-27 ENCOUNTER — Other Ambulatory Visit (HOSPITAL_BASED_OUTPATIENT_CLINIC_OR_DEPARTMENT_OTHER): Payer: Medicare Other

## 2014-11-27 DIAGNOSIS — C50811 Malignant neoplasm of overlapping sites of right female breast: Secondary | ICD-10-CM

## 2014-11-27 DIAGNOSIS — C50411 Malignant neoplasm of upper-outer quadrant of right female breast: Secondary | ICD-10-CM

## 2014-11-27 LAB — COMPREHENSIVE METABOLIC PANEL (CC13)
ALBUMIN: 4 g/dL (ref 3.5–5.0)
ALT: 12 U/L (ref 0–55)
ANION GAP: 6 meq/L (ref 3–11)
AST: 19 U/L (ref 5–34)
Alkaline Phosphatase: 35 U/L — ABNORMAL LOW (ref 40–150)
BUN: 18.7 mg/dL (ref 7.0–26.0)
CO2: 25 mEq/L (ref 22–29)
CREATININE: 1 mg/dL (ref 0.6–1.1)
Calcium: 10.1 mg/dL (ref 8.4–10.4)
Chloride: 107 mEq/L (ref 98–109)
EGFR: 55 mL/min/{1.73_m2} — ABNORMAL LOW (ref >90)
GLUCOSE: 108 mg/dL (ref 70–140)
POTASSIUM: 4.9 meq/L (ref 3.5–5.1)
Sodium: 138 mEq/L (ref 136–145)
Total Bilirubin: 0.45 mg/dL (ref 0.20–1.20)
Total Protein: 6.4 g/dL (ref 6.4–8.3)

## 2014-11-27 LAB — CBC WITH DIFFERENTIAL/PLATELET
BASO%: 0.6 % (ref 0.0–2.0)
BASOS ABS: 0 10*3/uL (ref 0.0–0.1)
EOS%: 3.3 % (ref 0.0–7.0)
Eosinophils Absolute: 0.2 10*3/uL (ref 0.0–0.5)
HCT: 37.1 % (ref 34.8–46.6)
HGB: 12.8 g/dL (ref 11.6–15.9)
LYMPH%: 26.8 % (ref 14.0–49.7)
MCH: 32 pg (ref 25.1–34.0)
MCHC: 34.5 g/dL (ref 31.5–36.0)
MCV: 92.8 fL (ref 79.5–101.0)
MONO#: 0.4 10*3/uL (ref 0.1–0.9)
MONO%: 6.9 % (ref 0.0–14.0)
NEUT#: 3.4 10*3/uL (ref 1.5–6.5)
NEUT%: 62.4 % (ref 38.4–76.8)
Platelets: 185 10*3/uL (ref 145–400)
RBC: 4 10*6/uL (ref 3.70–5.45)
RDW: 12.9 % (ref 11.2–14.5)
WBC: 5.4 10*3/uL (ref 3.9–10.3)
lymph#: 1.4 10*3/uL (ref 0.9–3.3)

## 2014-12-04 ENCOUNTER — Ambulatory Visit (HOSPITAL_BASED_OUTPATIENT_CLINIC_OR_DEPARTMENT_OTHER): Payer: Medicare Other | Admitting: Oncology

## 2014-12-04 ENCOUNTER — Ambulatory Visit (HOSPITAL_BASED_OUTPATIENT_CLINIC_OR_DEPARTMENT_OTHER): Payer: Medicare Other

## 2014-12-04 VITALS — BP 120/44 | HR 78 | Temp 98.4°F

## 2014-12-04 VITALS — BP 128/42 | HR 84 | Temp 98.0°F | Resp 18 | Ht 64.0 in | Wt 119.5 lb

## 2014-12-04 DIAGNOSIS — M810 Age-related osteoporosis without current pathological fracture: Secondary | ICD-10-CM | POA: Diagnosis not present

## 2014-12-04 DIAGNOSIS — Z79811 Long term (current) use of aromatase inhibitors: Secondary | ICD-10-CM | POA: Diagnosis not present

## 2014-12-04 DIAGNOSIS — C50411 Malignant neoplasm of upper-outer quadrant of right female breast: Secondary | ICD-10-CM

## 2014-12-04 DIAGNOSIS — Z170 Estrogen receptor positive status [ER+]: Secondary | ICD-10-CM

## 2014-12-04 DIAGNOSIS — C50419 Malignant neoplasm of upper-outer quadrant of unspecified female breast: Secondary | ICD-10-CM

## 2014-12-04 MED ORDER — DENOSUMAB 60 MG/ML ~~LOC~~ SOLN
60.0000 mg | Freq: Once | SUBCUTANEOUS | Status: AC
Start: 2014-12-04 — End: 2014-12-04
  Administered 2014-12-04: 60 mg via SUBCUTANEOUS
  Filled 2014-12-04: qty 1

## 2014-12-04 NOTE — Progress Notes (Signed)
[facility]  [phone] [phone]  OFFICE PROGRESS NOTE   ID: Dana Pruitt   DOB: 12/15/1940  MR#: [id number]  CSN#:[id number]   PCP: PHARR,WALTER DAVIDSON, MD GYN:  SU: Christian Streck, M.D. RAD ONC:Stacy Wentworth, M.D., Jonathan Berry, Jindong Xu   HISTORY OF PRESENT ILLNESS: From Dr. Peter Rubin's the patient evaluation note dated 03/06/2010:  "This is a delightful 74-year-old woman here today with her husband, Dana Pruitt, for evaluation and treatment of recent diagnosis of breast cancer.  This woman has been in good health.  She gets annual screening mammography up until two years ago.  She scheduled mammogram and about two days later felt a mass in the superior portion of the right breast.  She sought medical attention for this.  A mammogram and ultrasound performed on 02/25/2010 showed a mass measuring about 2.5 x 2.8 x 2.0 cm, right axillary lymph node with a fatty hilum was also seen.  Mammography showed this mass to be about 3.0 x 2.5 x 2.0 cm.  Biopsy was recommended, which was performed on 02/27/2010.  This showed intermediate-grade carcinoma, ER/PR positive 191% respectively, proliferative index 22%, HER-2 was not amplified with a ratio of 1.29.  She has been referred to see Dr. Streck and has seen us today for consideration of neoadjuvant therapy.  She is due for an MRI scan this coming Friday."   Her subsequent history is as detailed below.   INTERVAL HISTORY: Dana Pruitt returns today for followup of her breast cancer. She continues on letrozole, which she is tolerating well. Hot flashes and vaginal dryness have not been major concerns. She obtains a drug at a very good cost.  REVIEW OF SYSTEMS: She exercises chiefly by walking, and working in her yard. A detailed review of systems today was otherwise entirely stable.  PAST MEDICAL HISTORY: Past Medical History  Diagnosis Date  . Anxiety   . Exertional shortness of breath     "recently"  (10/17/2012)  . Anemia     "several times over the years" (10/17/2012)  . Carcinoma of breast treated with adjuvant hormone therapy 9/11    Femara  . Breast cancer 02/2010    right breast  . 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 Pneumonia     "that's why I'm here" (10/17/2012)  . Hyperlipidemia   . Hollenhorst plaque, left eye     apparently resolved at recent ophthalmology visit  . Left ventricular dysfunction   . Moderate aortic insufficiency   She has a history of mild depression.   PAST SURGICAL HISTORY: Past Surgical History  Procedure Laterality Date  . Cesarean section  1970; 1974  . Appendectomy  1974  . Dilation and curettage of uterus  1970's  . Colonoscopy    . Breast lumpectomy with sentinel lymph node biopsy Right 07/03/11    Invasive Ductal Carcinoma;0/3 nodes negative,; ER,PR Positive, Her-2 Neg; Ki-67 22%  . Breast biopsy Right 02/27/10    Needle core Biopsy; Invasive Mammary; ER/PR Positive, Her-2 Neu negative, Ki-67 22%  . Tubal ligation  1974    FAMILY HISTORY Family History  Problem Relation Age of Onset  . Heart disease Mother   . Heart disease Maternal Grandmother   . Heart disease Maternal Grandfather   . Cancer Father   The patient's father is deceased and her mother is alive and lives in a nursing facility.  She has one sister who is alive and well living in [place].     GYNECOLOGIC   HISTORY: G2, P2.  Menarche at age 15.  Menopause in her 50s.  She is status post two C-sections and has not had any hormonal replacement therapy.  There is no other family history of breast cancer in the family   SOCIAL HISTORY: The patient has been married since 1968.  Her husband Dana Pruitt is a retired stockbroker. They have two adult sons, one of her sons lives in [place] and her other son lives in [place].  She has two grandchildren, living in [place].    ADVANCED DIRECTIVES:  Not on file  HEALTH MAINTENANCE: History  Substance Use Topics  . Smoking  status: Former Smoker -- 2.00 packs/day for 25 years    Types: Cigarettes    Quit date: 06/30/1990  . Smokeless tobacco: Never Used  . Alcohol Use: 0.6 oz/week    1 Glasses of wine per week     Comment: 10/17/2012 "3-4 glasses of wine/month"    Colonoscopy:   PAP:  02/21/2010 Bone density:  2013; we are requesting results from [facility] Lipid panel:  Not on file  No Known Allergies  Current Outpatient Prescriptions  Medication Sig Dispense Refill  . acyclovir (ZOVIRAX) 200 MG capsule Take 2 capsules (400 mg total) by mouth 5 (five) times daily. (Patient taking differently: Take 400 mg by mouth 5 (five) times daily as needed. ) 20 capsule 0  . atorvastatin (LIPITOR) 20 MG tablet Take 29 mg by mouth daily.    . carvedilol (COREG) 6.25 MG tablet Take 1 tablet (6.25 mg total) by mouth 2 (two) times daily with a meal. 60 tablet 11  . Cholecalciferol (VITAMIN D PO) Take 1 tablet by mouth daily. 1000 IUs daily    . Lansoprazole (PREVACID 24HR PO) Take 1 capsule by mouth every morning.    . letrozole (FEMARA) 2.5 MG tablet Take 1 tablet (2.5 mg total) by mouth daily. 90 tablet 5  . lisinopril (PRINIVIL,ZESTRIL) 5 MG tablet Take 1 tablet (5 mg total) by mouth daily. 30 tablet 11  . loratadine (CLARITIN) 10 MG tablet Take 10 mg by mouth daily as needed.    . sertraline (ZOLOFT) 100 MG tablet Take 200 mg by mouth every morning.     No current facility-administered medications for this visit.    OBJECTIVE: Middle-aged white woman who appears stated age Filed Vitals:   12/04/14 1120  BP: 128/42  Pulse: 84  Temp: 98 F (36.7 C)  Resp: 18     Body mass index is 20.5 kg/(m^2).      ECOG FS: 0 - Asymptomatic  Sclerae unicteric, EOMs intact Oropharynx clear, dentition in good repair No cervical or supraclavicular adenopathy Lungs no rales or rhonchi Heart regular rate and rhythm Abd soft, nontender, positive bowel sounds MSK no focal spinal tenderness, no upper extremity lymphedema Neuro:  nonfocal, well oriented, somewhat depressed affect Breasts: The right breast is status post lumpectomy and radiation. There is no evidence of local recurrence. There are no skin or nipple changes of concern. The right axilla is benign. The left breast is unremarkable     LAB RESULTS: Lab Results  Component Value Date   WBC 5.4 11/27/2014   NEUTROABS 3.4 11/27/2014   HGB 12.8 11/27/2014   HCT 37.1 11/27/2014   MCV 92.8 11/27/2014   PLT 185 11/27/2014      Chemistry      Component Value Date/Time   NA 138 11/27/2014 1017   NA 140 09/05/2014 1026   NA 144 02/16/2014 0826     K 4.9 11/27/2014 1017   K 4.8 09/05/2014 1026   CL 106 09/05/2014 1026   CL 105 03/31/2012 1410   CO2 25 11/27/2014 1017   CO2 22 09/05/2014 1026   BUN 18.7 11/27/2014 1017   BUN 18 09/05/2014 1026   BUN 18 02/16/2014 0826   CREATININE 1.0 11/27/2014 1017   CREATININE 1.03 09/05/2014 1026   CREATININE 1.06* 02/16/2014 0826      Component Value Date/Time   CALCIUM 10.1 11/27/2014 1017   CALCIUM 10.3 09/05/2014 1026   ALKPHOS 35* 11/27/2014 1017   ALKPHOS 36* 08/20/2011 0934   AST 19 11/27/2014 1017   AST 15 08/20/2011 0934   ALT 12 11/27/2014 1017   ALT 13 08/20/2011 0934   BILITOT 0.45 11/27/2014 1017   BILITOT 0.5 08/20/2011 0934      Lab Results  Component Value Date   LABCA2 30 11/12/2010    Urinalysis    Component Value Date/Time   COLORURINE YELLOW 10/17/2012 1738   APPEARANCEUR CLEAR 10/17/2012 1738   LABSPEC 1.015 10/17/2012 1738   PHURINE 6.5 10/17/2012 1738   GLUCOSEU NEGATIVE 10/17/2012 1738   HGBUR NEGATIVE 10/17/2012 1738   BILIRUBINUR NEGATIVE 10/17/2012 1738   KETONESUR NEGATIVE 10/17/2012 1738   PROTEINUR 30* 10/17/2012 1738   UROBILINOGEN 0.2 10/17/2012 1738   NITRITE NEGATIVE 10/17/2012 1738   LEUKOCYTESUR NEGATIVE 10/17/2012 1738    STUDIES: Mammography at [facility] 08/13/2014 showed the breast density to be category C. There was no evidence of  malignancy.   ASSESSMENT: 74 y.o.[place] woman: 1.  Status post right breast upper outer quadrant biopsy 02/27/2010 for a clinical T2 N0, stage IIA invasive ductal carcinoma, grade 2, ER 100%, PR 91%, Ki-67 22%, HER-2/neu nonamplified  2.  Oncotype DX score of 13 predicted and outside the breast risk of recurrence within 10 years of 8% if the patient's only systemic treatment is tamoxifen for 5 years   3.  started letrozole neoadjuvantly in 02/2010.  4.  Status post right lumpectomy with right axillary node sentinel biopsy on 07/03/2011 for a ypT2, ypN0, stage IIA  invasive ductal carcinoma, grade 2, estrogen receptor 100% positive, progesterone receptor 13% positive, repeat HER-2 again negative  5. status post adjuvant radiation therapy from 08/05/2011 through 09/02/2011.    6. To continue letrozole through September 2016   7. Osteoporosis, with a T score of -2.5 on bone density scan at [facility] 05/01/2015 (stable).  PLAN: Karletta will complete 5 years of aromatase inhibitor antiestrogen therapy in August. We discussed the recent data that shows that prolonging aromatase inhibitor treatment for an additional 5 years further reduces the risk of recurrence by a small percentage. At the same time it worsens other problems particularly osteopenia/osteoporosis, which is something she is already struggling with.  We both feel comfortable stopping letrozole at this point, and I am accordingly releasing her back to her primary care physician. She has been on denosumab/Prolia every 6 months, and her most recent DEXA scan at [facility], 04/30/2014, was stable with a T score of -2.5. The patient will discuss continuing denosumab at Dr. Pennie Banter office when she next meets with him. Of course there are many other options, including Reclast, or the oral bisphosphonates like Fosamax and Boniva.  As far as breast cancer follow-up is concerned all Ruthann will need is yearly mammography and a yearly physician breast  exam.  I will be glad to see her again at any point in the future if and when the need arises, but as  of now were making no further routine appointment for her here. MAGRINAT,GUSTAV C, MD  12/04/2014, 11:41 AM 

## 2014-12-04 NOTE — Patient Instructions (Signed)
Denosumab injection What is this medicine? DENOSUMAB (den oh sue mab) slows bone breakdown. Prolia is used to treat osteoporosis in women after menopause and in men. Xgeva is used to prevent bone fractures and other bone problems caused by cancer bone metastases. Xgeva is also used to treat giant cell tumor of the bone. This medicine may be used for other purposes; ask your health care provider or pharmacist if you have questions. COMMON BRAND NAME(S): Prolia, XGEVA What should I tell my health care provider before I take this medicine? They need to know if you have any of these conditions: -dental disease -eczema -infection or history of infections -kidney disease or on dialysis -low blood calcium or vitamin D -malabsorption syndrome -scheduled to have surgery or tooth extraction -taking medicine that contains denosumab -thyroid or parathyroid disease -an unusual reaction to denosumab, other medicines, foods, dyes, or preservatives -pregnant or trying to get pregnant -breast-feeding How should I use this medicine? This medicine is for injection under the skin. It is given by a health care professional in a hospital or clinic setting. If you are getting Prolia, a special MedGuide will be given to you by the pharmacist with each prescription and refill. Be sure to read this information carefully each time. For Prolia, talk to your pediatrician regarding the use of this medicine in children. Special care may be needed. For Xgeva, talk to your pediatrician regarding the use of this medicine in children. While this drug may be prescribed for children as young as 13 years for selected conditions, precautions do apply. Overdosage: If you think you've taken too much of this medicine contact a poison control center or emergency room at once. Overdosage: If you think you have taken too much of this medicine contact a poison control center or emergency room at once. NOTE: This medicine is only for  you. Do not share this medicine with others. What if I miss a dose? It is important not to miss your dose. Call your doctor or health care professional if you are unable to keep an appointment. What may interact with this medicine? Do not take this medicine with any of the following medications: -other medicines containing denosumab This medicine may also interact with the following medications: -medicines that suppress the immune system -medicines that treat cancer -steroid medicines like prednisone or cortisone This list may not describe all possible interactions. Give your health care provider a list of all the medicines, herbs, non-prescription drugs, or dietary supplements you use. Also tell them if you smoke, drink alcohol, or use illegal drugs. Some items may interact with your medicine. What should I watch for while using this medicine? Visit your doctor or health care professional for regular checks on your progress. Your doctor or health care professional may order blood tests and other tests to see how you are doing. Call your doctor or health care professional if you get a cold or other infection while receiving this medicine. Do not treat yourself. This medicine may decrease your body's ability to fight infection. You should make sure you get enough calcium and vitamin D while you are taking this medicine, unless your doctor tells you not to. Discuss the foods you eat and the vitamins you take with your health care professional. See your dentist regularly. Brush and floss your teeth as directed. Before you have any dental work done, tell your dentist you are receiving this medicine. Do not become pregnant while taking this medicine or for 5 months after stopping   it. Women should inform their doctor if they wish to become pregnant or think they might be pregnant. There is a potential for serious side effects to an unborn child. Talk to your health care professional or pharmacist for more  information. What side effects may I notice from receiving this medicine? Side effects that you should report to your doctor or health care professional as soon as possible: -allergic reactions like skin rash, itching or hives, swelling of the face, lips, or tongue -breathing problems -chest pain -fast, irregular heartbeat -feeling faint or lightheaded, falls -fever, chills, or any other sign of infection -muscle spasms, tightening, or twitches -numbness or tingling -skin blisters or bumps, or is dry, peels, or red -slow healing or unexplained pain in the mouth or jaw -unusual bleeding or bruising Side effects that usually do not require medical attention (Report these to your doctor or health care professional if they continue or are bothersome.): -muscle pain -stomach upset, gas This list may not describe all possible side effects. Call your doctor for medical advice about side effects. You may report side effects to FDA at 1-800-FDA-1088. Where should I keep my medicine? This medicine is only given in a clinic, doctor's office, or other health care setting and will not be stored at home. NOTE: This sheet is a summary. It may not cover all possible information. If you have questions about this medicine, talk to your doctor, pharmacist, or health care provider.  2015, [employer]vier/Gold Standard. (2011-12-07 12:37:47)  

## 2015-03-05 ENCOUNTER — Ambulatory Visit (INDEPENDENT_AMBULATORY_CARE_PROVIDER_SITE_OTHER): Payer: Medicare Other | Admitting: Cardiovascular Disease

## 2015-03-05 ENCOUNTER — Encounter: Payer: Self-pay | Admitting: Cardiovascular Disease

## 2015-03-05 VITALS — BP 112/64 | HR 64 | Ht 64.0 in | Wt 122.0 lb

## 2015-03-05 DIAGNOSIS — E785 Hyperlipidemia, unspecified: Secondary | ICD-10-CM

## 2015-03-05 DIAGNOSIS — I519 Heart disease, unspecified: Secondary | ICD-10-CM

## 2015-03-05 DIAGNOSIS — I351 Nonrheumatic aortic (valve) insufficiency: Secondary | ICD-10-CM | POA: Diagnosis not present

## 2015-03-05 NOTE — Assessment & Plan Note (Signed)
History of hyperlipidemia on atorvastatin followed by her PCP 

## 2015-03-05 NOTE — Progress Notes (Signed)
03/05/2015 Dana Pruitt   1940/10/12  [id number]  Primary Physician Horatio Pel, MD Primary Cardiologist: Lorretta Harp MD Renae Gloss   HPI:  Dana Pruitt is a delightful 74 year old thin-appearing married Caucasian female mother of 2, grandmother of 2 grandchildren whose mother [name] was also a patient of mine and died at the age of 80. She was referred by Dr. Deland Pretty for cardiovascular evaluation because of echocardiogram performed 02/16/14 that showed moderate left ventricular dysfunction with an ejection fraction of 35-40% and moderate aortic insufficiency.I last saw her in the office 09/05/14. Her cardiovascular risk factor profile is remarkable for remote tobacco abuse having quit in 1990 and treated hyperlipidemia. There is no family history of heart disease. She has never had a heart attack or stroke and denies chest pain or shortness of breath. She did have a Hollenhorst plaque documented by ophthalmologic exam several months ago. Carotid Dopplers performed since that time were normal. The plaque is since resolved. A 2-D echo was performed that showed an EF of 35-40% with moderate aortic insufficiency. LV size is normal and there was no LVH. She did have breast cancer documented by lumpectomy in 2013 and had chemotherapy and radiation therapy. Her oncologist was Dr. Letta Pate . She is on Femara . I began her on low-dose carvedilol and repeated her 2-D echocardiogram on 08/10/14 revealed improvement in her LV function up to 45-50%. She continues to have moderate to severe AI with normal LV size (LV end-diastolic dimension of 47 mm). She only complains of mild dyspnea on exertion.   Current Outpatient Prescriptions  Medication Sig Dispense Refill  . aspirin 81 MG tablet Take 81 mg by mouth daily.    [name] atorvastatin (LIPITOR) 20 MG tablet Take 20 mg by mouth daily.     . carvedilol (COREG) 6.25 MG tablet Take 1 tablet (6.25 mg total) by mouth 2 (two)  times daily with a meal. 60 tablet 11  . Cholecalciferol (VITAMIN D PO) Take 1 tablet by mouth daily. 1000 IUs daily    . lisinopril (PRINIVIL,ZESTRIL) 5 MG tablet Take 1 tablet (5 mg total) by mouth daily. 30 tablet 11  . loratadine (CLARITIN) 10 MG tablet Take 10 mg by mouth daily as needed.    . sertraline (ZOLOFT) 100 MG tablet Take 200 mg by mouth every morning.    . valACYclovir (VALTREX) 1000 MG tablet 2 tablets 2 (two) times daily. Take as directed  0   No current facility-administered medications for this visit.    No Known Allergies  Social History   Social History  . Marital Status: Married    Spouse Name: N/A  . Number of Children: 2  . Years of Education: college   Occupational History  .     Social History Main Topics  . Smoking status: Former Smoker -- 2.00 packs/day for 25 years    Types: Cigarettes    Quit date: 06/30/1990  . Smokeless tobacco: Never Used  . Alcohol Use: 0.6 oz/week    1 Glasses of wine per week     Comment: 10/17/2012 "3-4 glasses of wine/month"  . Drug Use: No  . Sexual Activity: Yes    Birth Control/ Protection: Post-menopausal     Comment: Menarche age 59, G76,P2, Menopause in 67"s, No HRT Married   Other Topics Concern  . Not on file   Social History Narrative     Review of Systems: General: negative for chills, fever, night sweats or weight changes.  Cardiovascular: negative for chest pain, dyspnea on exertion, edema, orthopnea, palpitations, paroxysmal nocturnal dyspnea or shortness of breath Dermatological: negative for rash Respiratory: negative for cough or wheezing Urologic: negative for hematuria Abdominal: negative for nausea, vomiting, diarrhea, bright red blood per rectum, melena, or hematemesis Neurologic: negative for visual changes, syncope, or dizziness All other systems reviewed and are otherwise negative except as noted above.    Blood pressure 112/64, pulse 64, height 5\' 4"  (1.626 m), weight 122 lb (55.339  kg).  General appearance: alert and no distress Neck: no adenopathy, no carotid bruit, no JVD, supple, symmetrical, trachea midline and thyroid not enlarged, symmetric, no tenderness/mass/nodules Lungs: clear to auscultation bilaterally Heart: 2/6 diastolic murmur at left upper sternal border consistent with aortic stenosis Extremities: extremities normal, atraumatic, no cyanosis or edema Pulses: 2+ and symmetric Skin: Skin color, texture, turgor normal. No rashes or lesions Neurologic: Grossly normal  EKG normal sinus rhythm at 64 without ST or T-wave changes. I personally reviewed this EKG  ASSESSMENT AND PLAN:   Left ventricular dysfunction History of moderate left ventricular dysfunction with an ejection fraction of 45-50% probably related to valvular heart disease/aortic insufficiency. The patient is asymptomatic from this.  HLD (hyperlipidemia) History of hyperlipidemia on atorvastatin followed by her PCP  Aortic insufficiency History of moderate to severe aortic insufficiency followed by 2-D echocardiogram gram on annual basis. She is on carvedilol and lisinopril. Her last 2-D echo performed 08/10/14 revealed normal LV size and function with moderate to severe AI and diastolic dysfunction. We'll continue to follow this on annual basis.      Lorretta Harp MD FACP,FACC,FAHA, [facility] 03/05/2015 11:36 AM

## 2015-03-05 NOTE — Assessment & Plan Note (Signed)
History of moderate to severe aortic insufficiency followed by 2-D echocardiogram gram on annual basis. She is on carvedilol and lisinopril. Her last 2-D echo performed 08/10/14 revealed normal LV size and function with moderate to severe AI and diastolic dysfunction. We'll continue to follow this on annual basis.

## 2015-03-05 NOTE — Assessment & Plan Note (Signed)
History of moderate left ventricular dysfunction with an ejection fraction of 45-50% probably related to valvular heart disease/aortic insufficiency. The patient is asymptomatic from this.

## 2015-03-05 NOTE — Patient Instructions (Signed)
Medication Instructions:  Your physician recommends that you continue on your current medications as directed. Please refer to the Current Medication list given to you today.  Labwork: NONE  Testing/Procedures: Your physician has requested that you have an echocardiogram. Echocardiography is a painless test that uses sound waves to create images of your heart. It provides your doctor with information about the size and shape of your heart and how well your heart's chambers and valves are working. This procedure takes approximately one hour. There are no restrictions for this procedure. SCHEDULE IN [place]   Follow-Up: Your physician wants you to follow-up in: 12 months with Dr. [name]. You will receive a reminder letter in the mail two months in advance. If you don't receive a letter, please call our office to schedule the follow-up appointment.   Any Other Special Instructions Will Be Listed Below (If Applicable).

## 2015-03-20 ENCOUNTER — Other Ambulatory Visit: Payer: Self-pay | Admitting: *Deleted

## 2015-04-16 ENCOUNTER — Encounter: Payer: Self-pay | Admitting: Cardiovascular Disease

## 2015-05-24 ENCOUNTER — Other Ambulatory Visit: Payer: Self-pay | Admitting: Cardiovascular Disease

## 2015-05-27 NOTE — Telephone Encounter (Signed)
REFILL 

## 2015-06-06 ENCOUNTER — Other Ambulatory Visit: Payer: Self-pay | Admitting: Cardiovascular Disease

## 2015-06-06 NOTE — Telephone Encounter (Signed)
SPOKE WITH PHARMACIST REQUEST SENT IN ERROR

## 2015-06-14 ENCOUNTER — Other Ambulatory Visit: Payer: Self-pay

## 2015-06-19 ENCOUNTER — Other Ambulatory Visit: Payer: Self-pay | Admitting: *Deleted

## 2015-06-24 ENCOUNTER — Other Ambulatory Visit: Payer: Self-pay | Admitting: Cardiovascular Disease

## 2015-06-25 NOTE — Telephone Encounter (Signed)
Rx request sent to pharmacy.  

## 2015-06-26 ENCOUNTER — Other Ambulatory Visit: Payer: Self-pay | Admitting: *Deleted

## 2015-07-03 ENCOUNTER — Ambulatory Visit (HOSPITAL_COMMUNITY): Payer: Medicare Other | Attending: Cardiovascular Disease

## 2015-07-03 DIAGNOSIS — I351 Nonrheumatic aortic (valve) insufficiency: Secondary | ICD-10-CM | POA: Insufficient documentation

## 2015-07-03 DIAGNOSIS — E785 Hyperlipidemia, unspecified: Secondary | ICD-10-CM | POA: Insufficient documentation

## 2015-07-09 ENCOUNTER — Telehealth: Payer: Self-pay | Admitting: Cardiovascular Disease

## 2015-07-09 NOTE — Telephone Encounter (Signed)
Calling about results from Echo done last week . Please call

## 2015-08-15 DIAGNOSIS — Z853 Personal history of malignant neoplasm of breast: Secondary | ICD-10-CM | POA: Diagnosis not present

## 2015-09-13 ENCOUNTER — Telehealth: Payer: Self-pay

## 2015-09-13 NOTE — Telephone Encounter (Signed)
I spoke with pharmacist and let him know patient was supposed to stop Letrozole June 2016 - therefore refill not authorized.  He voiced understanding.

## 2015-11-25 DIAGNOSIS — E7800 Pure hypercholesterolemia, unspecified: Secondary | ICD-10-CM | POA: Diagnosis not present

## 2015-11-25 DIAGNOSIS — Z0000 Encounter for general adult medical examination without abnormal findings: Secondary | ICD-10-CM | POA: Diagnosis not present

## 2015-11-25 DIAGNOSIS — E559 Vitamin D deficiency, unspecified: Secondary | ICD-10-CM | POA: Diagnosis not present

## 2015-11-25 DIAGNOSIS — Z0001 Encounter for general adult medical examination with abnormal findings: Secondary | ICD-10-CM | POA: Diagnosis not present

## 2015-12-03 DIAGNOSIS — Z0000 Encounter for general adult medical examination without abnormal findings: Secondary | ICD-10-CM | POA: Diagnosis not present

## 2015-12-03 DIAGNOSIS — L570 Actinic keratosis: Secondary | ICD-10-CM | POA: Diagnosis not present

## 2015-12-03 DIAGNOSIS — L578 Other skin changes due to chronic exposure to nonionizing radiation: Secondary | ICD-10-CM | POA: Diagnosis not present

## 2015-12-03 DIAGNOSIS — K5730 Diverticulosis of large intestine without perforation or abscess without bleeding: Secondary | ICD-10-CM | POA: Diagnosis not present

## 2016-01-02 DIAGNOSIS — M810 Age-related osteoporosis without current pathological fracture: Secondary | ICD-10-CM | POA: Diagnosis not present

## 2016-01-06 ENCOUNTER — Telehealth: Payer: Self-pay | Admitting: Cardiovascular Disease

## 2016-01-06 NOTE — Telephone Encounter (Signed)
I spoke with the pt and made her aware that per documentation on 06/2015 Echocardiogram the pt is not due for a repeat study until 06/2016.  The pt's annual office visit is scheduled in September with Dr [name].

## 2016-01-06 NOTE — Telephone Encounter (Signed)
New MEssage  Pt requested to speak w/ RN about possible Echo- no order/recall- Please call back and discuss.

## 2016-02-19 ENCOUNTER — Encounter: Payer: Self-pay | Admitting: Cardiovascular Disease

## 2016-02-19 ENCOUNTER — Telehealth: Payer: Self-pay | Admitting: Cardiovascular Disease

## 2016-02-19 NOTE — Telephone Encounter (Signed)
Closed encounter °

## 2016-02-20 DIAGNOSIS — R197 Diarrhea, unspecified: Secondary | ICD-10-CM | POA: Diagnosis not present

## 2016-02-20 DIAGNOSIS — Z23 Encounter for immunization: Secondary | ICD-10-CM | POA: Diagnosis not present

## 2016-02-20 DIAGNOSIS — K219 Gastro-esophageal reflux disease without esophagitis: Secondary | ICD-10-CM | POA: Diagnosis not present

## 2016-02-20 DIAGNOSIS — Z8719 Personal history of other diseases of the digestive system: Secondary | ICD-10-CM | POA: Diagnosis not present

## 2016-02-21 DIAGNOSIS — R197 Diarrhea, unspecified: Secondary | ICD-10-CM | POA: Diagnosis not present

## 2016-03-04 ENCOUNTER — Ambulatory Visit: Payer: Medicare Other | Admitting: Cardiovascular Disease

## 2016-03-05 DIAGNOSIS — K219 Gastro-esophageal reflux disease without esophagitis: Secondary | ICD-10-CM | POA: Diagnosis not present

## 2016-03-05 DIAGNOSIS — K900 Celiac disease: Secondary | ICD-10-CM | POA: Diagnosis not present

## 2016-03-11 ENCOUNTER — Encounter: Payer: Self-pay | Admitting: Cardiovascular Disease

## 2016-03-11 ENCOUNTER — Ambulatory Visit (INDEPENDENT_AMBULATORY_CARE_PROVIDER_SITE_OTHER): Payer: Medicare Other | Admitting: Cardiovascular Disease

## 2016-03-11 VITALS — BP 100/50 | HR 70 | Ht 64.0 in | Wt 119.8 lb

## 2016-03-11 DIAGNOSIS — I519 Heart disease, unspecified: Secondary | ICD-10-CM | POA: Diagnosis not present

## 2016-03-11 DIAGNOSIS — Z0000 Encounter for general adult medical examination without abnormal findings: Secondary | ICD-10-CM

## 2016-03-11 DIAGNOSIS — I351 Nonrheumatic aortic (valve) insufficiency: Secondary | ICD-10-CM

## 2016-03-11 NOTE — Progress Notes (Signed)
03/11/2016 Dana Pruitt   June 07, 1941  [id number]  Primary Physician Horatio Pel, MD Primary Cardiologist: Lorretta Harp MD Dana Pruitt  HPI:  [name] is a delightful 75 year old thin-appearing married Caucasian female mother of 2, grandmother of 2 grandchildren whose mother Dana Pruitt was also a patient of mine and died at the age of 40. She was referred by Dr. Deland Pretty for cardiovascular evaluation because of echocardiogram performed 02/16/14 that showed moderate left ventricular dysfunction with an ejection fraction of 35-40% and moderate aortic insufficiency.I last saw her in the office 03/05/15. Her cardiovascular risk factor profile is remarkable for remote tobacco abuse having quit in 1990 and treated hyperlipidemia. There is no family history of heart disease. She has never had a heart attack or stroke and denies chest pain or shortness of breath. She did have a Hollenhorst plaque documented by ophthalmologic exam several months ago. Carotid Dopplers performed since that time were normal. The plaque is since resolved. A 2-D echo was performed that showed an EF of 35-40% with moderate aortic insufficiency. LV size is normal and there was no LVH. She did have breast cancer documented by lumpectomy in 2013 and had chemotherapy and radiation therapy. Her oncologist was Dr. Letta Pate . She is on Femara . I began her on low-dose carvedilol and repeated her 2-D echocardiogram on 08/10/14 revealed improvement in her LV function up to 45-50%. She continues to have moderate to severe AI with normal LV size and function.. She only complains of mild dyspnea on exertion.   Current Outpatient Prescriptions  Medication Sig Dispense Refill  . aspirin 81 MG tablet Take 81 mg by mouth daily.    [name] atorvastatin (LIPITOR) 20 MG tablet Take 20 mg by mouth daily.     . carvedilol (COREG) 6.25 MG tablet take 1 tablet by mouth twice a day with meals 60 tablet 11  .  Cholecalciferol (VITAMIN D PO) Take 1 tablet by mouth daily. 1000 IUs daily    . lisinopril (PRINIVIL,ZESTRIL) 5 MG tablet take 1 tablet by mouth once daily 30 tablet 11  . loratadine (CLARITIN) 10 MG tablet Take 10 mg by mouth daily as needed.    . sertraline (ZOLOFT) 100 MG tablet Take 200 mg by mouth every morning.    . valACYclovir (VALTREX) 1000 MG tablet 2 tablets 2 (two) times daily. Take as directed  0   No current facility-administered medications for this visit.     No Known Allergies  Social History   Social History  . Marital status: Married    Spouse name: N/A  . Number of children: 2  . Years of education: college   Occupational History  .  Retired   Social History Main Topics  . Smoking status: Former Smoker    Packs/day: 2.00    Years: 25.00    Types: Cigarettes    Quit date: 06/30/1990  . Smokeless tobacco: Never Used  . Alcohol use 0.6 oz/week    1 Glasses of wine per week     Comment: 10/17/2012 "3-4 glasses of wine/month"  . Drug use: No  . Sexual activity: Yes    Birth control/ protection: Post-menopausal     Comment: Menarche age 38, G65,P2, Menopause in 23"s, No HRT Married   Other Topics Concern  . Not on file   Social History Narrative  . No narrative on file     Review of Systems: General: negative for chills, fever, night sweats or weight  changes.  Cardiovascular: negative for chest pain, dyspnea on exertion, edema, orthopnea, palpitations, paroxysmal nocturnal dyspnea or shortness of breath Dermatological: negative for rash Respiratory: negative for cough or wheezing Urologic: negative for hematuria Abdominal: negative for nausea, vomiting, diarrhea, bright red blood per rectum, melena, or hematemesis Neurologic: negative for visual changes, syncope, or dizziness All other systems reviewed and are otherwise negative except as noted above.    Blood pressure (!) 100/50, pulse 70, height 5\' 4"  (1.626 m), weight 119 lb 12.8 oz (54.3 kg),  SpO2 95 %.  General appearance: alert and no distress Neck: no adenopathy, no carotid bruit, no JVD, supple, symmetrical, trachea midline and thyroid not enlarged, symmetric, no tenderness/mass/nodules Lungs: clear to auscultation bilaterally Heart: regular rate and rhythm, S1, S2 normal, no murmur, click, rub or gallop Extremities: extremities normal, atraumatic, no cyanosis or edema  EKG normal sinus rhythm at 70 with nonspecific ST and T-wave changes. I personally reviewed this EKG  ASSESSMENT AND PLAN:   HLD (hyperlipidemia) History of hyperlipidemia on statin therapy followed by her PCP  Left ventricular dysfunction History of left ventricular dysfunction the past. I have begun her on carvedilol and titrated up slightly. Her most recent 2-D echo amazingly showed normal LV size and function with moderate to severe aortic insufficiency performed 07/03/15. She is asymptomatic.  Aortic insufficiency Moderate to severe aortic insufficiency demonstrated consistently by 2-D echo most recently 07/03/15. Her LV size and function are normal. She is asymptomatic.      Lorretta Harp MD FACP,FACC,FAHA, [facility] 03/11/2016 4:14 PM

## 2016-03-11 NOTE — Assessment & Plan Note (Signed)
History of hyperlipidemia on statin therapy followed by her PCP. 

## 2016-03-11 NOTE — Assessment & Plan Note (Signed)
History of left ventricular dysfunction the past. I have begun her on carvedilol and titrated up slightly. Her most recent 2-D echo amazingly showed normal LV size and function with moderate to severe aortic insufficiency performed 07/03/15. She is asymptomatic.

## 2016-03-11 NOTE — Assessment & Plan Note (Signed)
Moderate to severe aortic insufficiency demonstrated consistently by 2-D echo most recently 07/03/15. Her LV size and function are normal. She is asymptomatic.

## 2016-03-11 NOTE — Patient Instructions (Signed)
Medication Instructions:  NO CHANGES.   Testing/Procedures: Your physician has requested that you have an echocardiogram. Echocardiography is a painless test that uses sound waves to create images of your heart. It provides your doctor with information about the size and shape of your heart and how well your heart's chambers and valves are working. This procedure takes approximately one hour. There are no restrictions for this procedure.   SCHEDULE FOR January 2018.   Follow-Up: Your physician wants you to follow-up in: [place]. You will receive a reminder letter in the mail two months in advance. If you don't receive a letter, please call our office to schedule the follow-up appointment.   If you need a refill on your cardiac medications before your next appointment, please call your pharmacy.

## 2016-03-14 ENCOUNTER — Other Ambulatory Visit: Payer: Self-pay | Admitting: Oncology

## 2016-05-05 ENCOUNTER — Telehealth: Payer: Self-pay | Admitting: *Deleted

## 2016-05-05 NOTE — Telephone Encounter (Signed)
This RN spoke with pt per her call due to " my pharmacy called stating the prescription for femara is ready for pick up - I thought I completed my therapy last year does he want me to resume this medication ?"  This RN reviewed chart and noted prescription refill was made thru EPIC to MD- noted per last office visit June 2016 pt completed AI therapy and released from follow up.  Informed pt refill was via an automated system and she does not need to resume medication.  This RN contacted dispensing pharmacy and spoke with pharmacist- Ulice Brilliant. Medication d/ced from formulary.  No other needs at this time.

## 2016-05-22 DIAGNOSIS — R197 Diarrhea, unspecified: Secondary | ICD-10-CM | POA: Diagnosis not present

## 2016-06-01 ENCOUNTER — Other Ambulatory Visit: Payer: Self-pay | Admitting: Cardiovascular Disease

## 2016-06-02 NOTE — Telephone Encounter (Signed)
REFILL 

## 2016-06-09 DIAGNOSIS — M810 Age-related osteoporosis without current pathological fracture: Secondary | ICD-10-CM | POA: Diagnosis not present

## 2016-07-06 ENCOUNTER — Other Ambulatory Visit: Payer: Self-pay | Admitting: Cardiovascular Disease

## 2016-07-08 ENCOUNTER — Other Ambulatory Visit (HOSPITAL_COMMUNITY): Payer: Medicare Other

## 2016-07-14 ENCOUNTER — Encounter (INDEPENDENT_AMBULATORY_CARE_PROVIDER_SITE_OTHER): Payer: Self-pay

## 2016-07-14 ENCOUNTER — Other Ambulatory Visit: Payer: Self-pay

## 2016-07-14 ENCOUNTER — Ambulatory Visit (HOSPITAL_COMMUNITY): Payer: Medicare Other | Attending: Internal Medicine

## 2016-07-14 DIAGNOSIS — Z0000 Encounter for general adult medical examination without abnormal findings: Secondary | ICD-10-CM | POA: Insufficient documentation

## 2016-07-14 DIAGNOSIS — I351 Nonrheumatic aortic (valve) insufficiency: Secondary | ICD-10-CM | POA: Diagnosis not present

## 2016-07-14 DIAGNOSIS — I352 Nonrheumatic aortic (valve) stenosis with insufficiency: Secondary | ICD-10-CM | POA: Insufficient documentation

## 2016-07-14 DIAGNOSIS — I501 Left ventricular failure: Secondary | ICD-10-CM | POA: Insufficient documentation

## 2016-07-14 DIAGNOSIS — R9439 Abnormal result of other cardiovascular function study: Secondary | ICD-10-CM | POA: Insufficient documentation

## 2016-07-14 DIAGNOSIS — I371 Nonrheumatic pulmonary valve insufficiency: Secondary | ICD-10-CM | POA: Insufficient documentation

## 2016-07-14 DIAGNOSIS — M810 Age-related osteoporosis without current pathological fracture: Secondary | ICD-10-CM | POA: Diagnosis not present

## 2016-07-23 ENCOUNTER — Telehealth: Payer: Self-pay | Admitting: Cardiovascular Disease

## 2016-07-23 DIAGNOSIS — I7121 Aneurysm of the ascending aorta, without rupture: Secondary | ICD-10-CM

## 2016-07-23 DIAGNOSIS — I712 Thoracic aortic aneurysm, without rupture: Secondary | ICD-10-CM

## 2016-07-23 NOTE — Telephone Encounter (Signed)
LMTCB for results. Repeat order entered.

## 2016-07-23 NOTE — Telephone Encounter (Signed)
-----   Message from Lorretta Harp, MD sent at 07/16/2016 11:20 AM EST ----- Normal LV size and function with moderate aortic insufficiency and aortic sclerosis without stenosis. There was mild dilatation of the ascending thoracic aorta. Repeat 12 months

## 2016-07-24 ENCOUNTER — Telehealth: Payer: Self-pay | Admitting: Cardiovascular Disease

## 2016-07-24 NOTE — Telephone Encounter (Signed)
New message ° ° ° ° °Returning a call to the nurse °

## 2016-08-06 NOTE — Telephone Encounter (Signed)
Left message to call back for results

## 2016-08-13 ENCOUNTER — Telehealth: Payer: Self-pay | Admitting: Cardiology

## 2016-08-13 NOTE — Telephone Encounter (Signed)
Called patient and left my name and number to call me back to schedule her echo for next year.

## 2016-08-17 DIAGNOSIS — Z853 Personal history of malignant neoplasm of breast: Secondary | ICD-10-CM | POA: Diagnosis not present

## 2016-09-08 DIAGNOSIS — N390 Urinary tract infection, site not specified: Secondary | ICD-10-CM | POA: Diagnosis not present

## 2016-09-08 DIAGNOSIS — R350 Frequency of micturition: Secondary | ICD-10-CM | POA: Diagnosis not present

## 2016-09-21 DIAGNOSIS — H524 Presbyopia: Secondary | ICD-10-CM | POA: Diagnosis not present

## 2016-10-22 DIAGNOSIS — H40013 Open angle with borderline findings, low risk, bilateral: Secondary | ICD-10-CM | POA: Diagnosis not present

## 2016-12-15 DIAGNOSIS — E559 Vitamin D deficiency, unspecified: Secondary | ICD-10-CM | POA: Diagnosis not present

## 2016-12-15 DIAGNOSIS — Z923 Personal history of irradiation: Secondary | ICD-10-CM | POA: Diagnosis not present

## 2016-12-15 DIAGNOSIS — M810 Age-related osteoporosis without current pathological fracture: Secondary | ICD-10-CM | POA: Diagnosis not present

## 2016-12-15 DIAGNOSIS — Z853 Personal history of malignant neoplasm of breast: Secondary | ICD-10-CM | POA: Diagnosis not present

## 2016-12-15 DIAGNOSIS — Z9221 Personal history of antineoplastic chemotherapy: Secondary | ICD-10-CM | POA: Diagnosis not present

## 2016-12-15 DIAGNOSIS — Z0001 Encounter for general adult medical examination with abnormal findings: Secondary | ICD-10-CM | POA: Diagnosis not present

## 2016-12-15 DIAGNOSIS — E7800 Pure hypercholesterolemia, unspecified: Secondary | ICD-10-CM | POA: Diagnosis not present

## 2016-12-21 ENCOUNTER — Telehealth: Payer: Self-pay | Admitting: Emergency Medicine

## 2016-12-21 ENCOUNTER — Encounter: Payer: Self-pay | Admitting: Emergency Medicine

## 2016-12-21 NOTE — Telephone Encounter (Signed)
Upon looking at the schedule saw that [facility] has several open slots next week on two separate days. Spoke with pt and was able to get her in to see [facility] on Tuesday. Pt states she has already seen her pcp about her increase sob and chest tightness and they felt like she needed to come here for further treatment. Pt had no further questions at this time. Nothing further is needed

## 2016-12-29 ENCOUNTER — Ambulatory Visit (INDEPENDENT_AMBULATORY_CARE_PROVIDER_SITE_OTHER): Payer: Medicare Other | Admitting: Pulmonary Disease

## 2016-12-29 ENCOUNTER — Encounter: Payer: Self-pay | Admitting: Pulmonary Disease

## 2016-12-29 ENCOUNTER — Ambulatory Visit (INDEPENDENT_AMBULATORY_CARE_PROVIDER_SITE_OTHER)
Admission: RE | Admit: 2016-12-29 | Discharge: 2016-12-29 | Disposition: A | Discharge status: Home / Self Care | Payer: Medicare Other | Source: Ambulatory Visit | Attending: Pulmonary Disease | Admitting: Pulmonary Disease

## 2016-12-29 VITALS — BP 126/60 | HR 69 | Ht 64.0 in | Wt 120.2 lb

## 2016-12-29 DIAGNOSIS — R05 Cough: Secondary | ICD-10-CM | POA: Diagnosis not present

## 2016-12-29 DIAGNOSIS — J449 Chronic obstructive pulmonary disease, unspecified: Secondary | ICD-10-CM | POA: Diagnosis not present

## 2016-12-29 DIAGNOSIS — I2729 Other secondary pulmonary hypertension: Secondary | ICD-10-CM

## 2016-12-29 NOTE — Patient Instructions (Signed)
   Call or e-mail me if you have any new breathing problems or questions before your next appointment.  We will review your test results at your follow-up appointment.  TESTS ORDRED: 1. Full PFTs before next appointment 2. 6MWT on room air before next appointment 3. CXR PA/LAT today 4. Serum Alpha-1 Antitrypsin today

## 2016-12-29 NOTE — Progress Notes (Signed)
Subjective:    Patient ID: Dana Pruitt, female    DOB: 07-Apr-1941, 76 y.o.   MRN: [id number]  HPI Patient was seen remotely by Dr. Lamonte Sakai in 2014 with left lower lobe pneumonia that has previously resolved. She was on an albuterol only as needed for mild COPD based on prior pulmonary function testing. She reports she is able to walk 5 miles as long as it is not hot and there is no incline. She does have intermittent "tightness" in her chest. She does admit to dyspnea with walking up an incline. She reports a frequent, nonproductive cough. Her cough seems to fluctuate and it has awoken her out of sleep. She coughs at least some every day. She denies any wheezing. She denies any trigger for her cough that she has noticed. She denies any prior history of bronchitis. No breathing problems or asthma as a child or young adult. No other chest pain or pressure. No fever, chills, or sweats. No abdominal pain or nausea. She does have occasional reflux. She reports morning brash water taste about once a month. No rashes or bruising. She has noticed no swelling in her legs. No dysphagia odynophagia. She will wake up at night occasionally. Feels well rested in the morning. No morning headaches. Infrequent naps.   Review of Systems No joint swelling, stiffness, erythema, or pain. No dysuria or hematuria. A pertinent 14 point review of systems is negative except as per the history of presenting illness.  No Known Allergies  Current Outpatient Prescriptions on File Prior to Visit  Medication Sig Dispense Refill  . atorvastatin (LIPITOR) 20 MG tablet Take 20 mg by mouth daily.     . carvedilol (COREG) 6.25 MG tablet take 1 tablet by mouth twice a day with meals 60 tablet 11  . lisinopril (PRINIVIL,ZESTRIL) 5 MG tablet take 1 tablet by mouth once daily 30 tablet 11  . sertraline (ZOLOFT) 100 MG tablet Take 200 mg by mouth every morning.    . valACYclovir (VALTREX) 1000 MG tablet 2 tablets 2 (two) times daily.  Take as directed  0  . aspirin 81 MG tablet Take 81 mg by mouth daily.    . Cholecalciferol (VITAMIN D PO) Take 1 tablet by mouth daily. 1000 IUs daily    . loratadine (CLARITIN) 10 MG tablet Take 10 mg by mouth daily as needed.     No current facility-administered medications on file prior to visit.     Past Medical History:  Diagnosis Date  . Anemia    "several times over the years" (10/17/2012)  . Anxiety   . Breast cancer ([place]) 02/2010   right breast  . Carcinoma of breast treated with adjuvant hormone therapy ([place]) 9/11   Femara  . CHF (congestive heart failure) ([place])   . COPD (chronic obstructive pulmonary disease) ([place])   . Exertional shortness of breath    "recently" (10/17/2012)  . Hollenhorst plaque, left eye    apparently resolved at recent ophthalmology visit  . Hyperlipidemia   . 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 Left ventricular dysfunction   . Moderate aortic insufficiency   . Pneumonia    "that's why I'm here" (10/17/2012)    Past Surgical History:  Procedure Laterality Date  . APPENDECTOMY  1974  . BREAST BIOPSY Right 02/27/10   Needle core Biopsy; Invasive Mammary; ER/PR Positive, Her-2 Neu negative, Ki-67 22%  . BREAST LUMPECTOMY WITH SENTINEL LYMPH NODE BIOPSY Right 07/03/11   Invasive Ductal Carcinoma;0/3 nodes  negative,; ER,PR Positive, Her-2 Neg; Ki-67 22%  . [place]; 1974  . COLONOSCOPY    . DILATION AND CURETTAGE OF UTERUS  1970's  . TUBAL LIGATION  1974    Family History  Problem Relation Age of Onset  . Heart disease Mother   . Cancer Father   . Heart disease Maternal Grandmother   . Heart disease Maternal Grandfather   . Cancer Paternal Grandmother   . Lung disease Neg Hx     Social History   Social History  . Marital status: Married    Spouse name: N/A  . Number of children: 2  . Years of education: college   Occupational History  .  Retired   Social History Main Topics  . Smoking status: Former  Smoker    Packs/day: 3.00    Years: 30.00    Types: Cigarettes    Start date: 05/19/1958    Quit date: 06/30/1990  . Smokeless tobacco: Never Used     Comment: Stopped smoking for 2-3 years during second child.   . Alcohol use 0.6 oz/week    1 Glasses of wine per week     Comment: 10/17/2012 "3-4 glasses of wine/month"  . Drug use: No  . Sexual activity: Yes    Birth control/ protection: Post-menopausal     Comment: Menarche age 43, G97,P2, Menopause in 67"s, No HRT Married   Other Topics Concern  . None   Social History Narrative    Pulmonary (12/29/16):   Originally from [place], [place]. Moved to [place] at 76 y.o. Does have a dog. Hasn't worked outside the home. Remote parakeet exposure from her children. No mold or hot tub exposure. Enjoys doing yard work & walking.       Objective:   Physical Exam BP 126/60 (BP Location: Right Arm, Patient Position: Sitting, Cuff Size: Normal)   Pulse 69   Ht 5' 4"  (1.626 m)   Wt 120 lb 3.2 oz (54.5 kg)   SpO2 93%   BMI 20.63 kg/m  General:  Awake. Alert. No acute distress. Thin, Caucasian female. Integument:  Warm & dry. No rash on exposed skin. No bruising on exposed skin. Extremities:  No cyanosis or clubbing.  Lymphatics:  No appreciated cervical or supraclavicular lymphadenoapthy. HEENT:  Moist mucus membranes. No oral ulcers. No scleral injection or icterus. Mild bilateral nasal turbinate swelling. Cardiovascular:  Regular rate and rhythm. Faint diastolic murmur. No edema. No appreciable JVD.  Pulmonary:  Extremely minimal basilar crackles. Otherwise good aeration bilaterally. Symmetric chest wall expansion. No accessory muscle use on room air. Abdomen: Soft. Normal bowel sounds. Nondistended. Grossly nontender. Musculoskeletal:  Normal bulk and tone. Hand grip strength 5/5 bilaterally. No joint deformity or effusion appreciated. Minimal synovial thickening of bilateral PIP & DIP joints. Neurological:  CN 2-12 grossly in tact. No  meningismus. Moving all 4 extremities equally. Symmetric brachioradialis deep tendon reflexes. Psychiatric:  Mood and affect congruent. Speech normal rhythm, rate & tone.   PFT 12/30/12: FVC 2.77 L (101%) FEV1 1.92 L (89%) FEV1/FVC 0.69 FEF 25-75 1.13 L (51%) negative bronchodilator response TLC 4.77 L (97%) RV 110% DLCO uncorrected 51%  CARDIAC TTE (07/14/16):  LV normal in size with EF 60-65% & no regional wall motion abnormalities. Grade 1 diastolic dysfunction. LA & RA normal in size. RV mildly dilated with reduced systolic function. Moderate aortic regurgitation without stenosis. Ascending aorta is mildly dilated with aortic root normal in size. No mitral stenosis or regurgitation. Mild pulmonic regurgitation.  No significant tricuspid regurgitation. No pericardial effusion.    Assessment & Plan:  76 y.o. female with evidence of moderate mitral regurgitation on echocardiogram performed in January as well as right ventricular dysfunction and dilation of the pulmonary artery. This is consistent with probable underlying pulmonary hypertension. Reviewing her previous pulmonary function testing showed only mild airway obstruction without a significant bronchodilator response. This would certainly not account for the degree of her carbon monoxide diffusion capacity reduction. We will need to perform further workup to evaluate for other potential causes for these abnormalities.  1. Mild COPD: Screening for alpha-1 antitrypsin deficiency. Check a full pulmonary function testing before next appointment. Checking chest x-ray PA/LAT today. 2. Pulmonary hypertension: Checking chest x-ray PA/LAT today. Possible need for right heart catheterization. Checking 6 minute walk test on room air before next appointment. Consider VQ scan based on test results. 3. Health maintenance: Status post Prevnar January 2009. 4. Follow-up: Patient to return to clinic in 6 weeks or sooner if needed.  Sonia Baller Ashok Cordia,  M.D. [facility] Pager:  [phone] After 3pm or if no response, call [phone] 2:41 PM 12/29/16

## 2016-12-31 NOTE — Progress Notes (Signed)
Spoke with patient and informed her of results. Pt verbalized understanding and did not have any questions. Nothing further is needed.

## 2017-01-04 ENCOUNTER — Ambulatory Visit (INDEPENDENT_AMBULATORY_CARE_PROVIDER_SITE_OTHER): Payer: Medicare Other | Admitting: Pulmonary Disease

## 2017-01-04 DIAGNOSIS — J449 Chronic obstructive pulmonary disease, unspecified: Secondary | ICD-10-CM

## 2017-01-04 LAB — PULMONARY FUNCTION TEST
DL/VA % PRED: 58 %
DL/VA: 2.77 ml/min/mmHg/L
DLCO COR % PRED: 43 %
DLCO cor: 10.04 ml/min/mmHg
DLCO unc % pred: 43 %
DLCO unc: 10.22 ml/min/mmHg
FEF 25-75 POST: 0.51 L/s
FEF 25-75 Pre: 0.57 L/sec
FEF2575-%CHANGE-POST: -9 %
FEF2575-%PRED-PRE: 34 %
FEF2575-%Pred-Post: 31 %
FEV1-%CHANGE-POST: 0 %
FEV1-%Pred-Post: 63 %
FEV1-%Pred-Pre: 64 %
FEV1-Post: 1.31 L
FEV1-Pre: 1.32 L
FEV1FVC-%CHANGE-POST: 3 %
FEV1FVC-%Pred-Pre: 80 %
FEV6-%Change-Post: -3 %
FEV6-%PRED-PRE: 81 %
FEV6-%Pred-Post: 78 %
FEV6-Post: 2.03 L
FEV6-Pre: 2.12 L
FEV6FVC-%Change-Post: 0 %
FEV6FVC-%Pred-Post: 102 %
FEV6FVC-%Pred-Pre: 102 %
FVC-%Change-Post: -3 %
FVC-%PRED-POST: 76 %
FVC-%PRED-PRE: 79 %
FVC-POST: 2.09 L
FVC-PRE: 2.17 L
POST FEV6/FVC RATIO: 97 %
PRE FEV1/FVC RATIO: 61 %
Post FEV1/FVC ratio: 63 %
Pre FEV6/FVC Ratio: 98 %
RV % pred: 136 %
RV: 3.06 L
TLC % pred: 105 %
TLC: 5.23 L

## 2017-01-04 NOTE — Progress Notes (Signed)
PFT done today. 

## 2017-01-18 DIAGNOSIS — M810 Age-related osteoporosis without current pathological fracture: Secondary | ICD-10-CM | POA: Diagnosis not present

## 2017-02-19 ENCOUNTER — Institutional Professional Consult (permissible substitution): Payer: Medicare Other | Admitting: Emergency Medicine

## 2017-02-26 ENCOUNTER — Ambulatory Visit: Payer: Medicare Other | Admitting: Pulmonary Disease

## 2017-02-26 ENCOUNTER — Ambulatory Visit: Payer: Medicare Other

## 2017-03-08 ENCOUNTER — Encounter: Payer: Self-pay | Admitting: Pulmonary Disease

## 2017-03-08 ENCOUNTER — Ambulatory Visit (INDEPENDENT_AMBULATORY_CARE_PROVIDER_SITE_OTHER): Payer: Medicare Other | Admitting: Pulmonary Disease

## 2017-03-08 ENCOUNTER — Ambulatory Visit (INDEPENDENT_AMBULATORY_CARE_PROVIDER_SITE_OTHER): Payer: Medicare Other | Admitting: *Deleted

## 2017-03-08 ENCOUNTER — Other Ambulatory Visit: Payer: Medicare Other

## 2017-03-08 VITALS — BP 118/68 | HR 78

## 2017-03-08 DIAGNOSIS — J449 Chronic obstructive pulmonary disease, unspecified: Secondary | ICD-10-CM

## 2017-03-08 DIAGNOSIS — I2729 Other secondary pulmonary hypertension: Secondary | ICD-10-CM | POA: Diagnosis not present

## 2017-03-08 NOTE — Progress Notes (Signed)
SIX MIN WALK 03/08/2017 03/08/2017  Medications atorvastatin 20mg , coreg 6.25, lisinopril 5mg , sertraline 100mg , and valcyclovir 1000mg   atorvastatin 20mg , carvedilol 6.25mg , lisinopril 5mg , sertraline 100mg  and valacyclovir 1000mg . All were taken around 11am this morning.   Supplimental Oxygen during Test? (L/min) No No  Laps 8 -  Partial Lap (in Meters) 30 -  Baseline BP (sitting) 118/76 -  Baseline Heartrate 85 -  Baseline Dyspnea (Borg Scale) 0 -  Baseline Fatigue (Borg Scale) 0 -  Baseline SPO2 96 -  BP (sitting) 120/74 -  Heartrate 85 -  Dyspnea (Borg Scale) 0.5 -  Fatigue (Borg Scale) 0 -  SPO2 94 -  BP (sitting) 120/74 -  Heartrate 82 -  SPO2 99 -  Stopped or Paused before Six Minutes Yes -  Other Symptoms at end of Exercise Paused with 437 due to not receiving a good pulse ox signal. Patient denied any symptoms of CP or SOB.  -  Distance Completed 414 -  Tech Comments: Patient was able to walk at her normal pace during walk. Denied any symptoms of SOB or chest pain after walk. No oxygen was needed during test.  -

## 2017-03-08 NOTE — Patient Instructions (Addendum)
   Call me/our office if you have any new breathing problems or questions before your next appointment.  Get the Pneumovax-23 Vaccine at Dr. Kennon Holter office tomorrow.  Remember to get the Flu shot in October.  We will see you back in 6 months or sooner if needed.  Remember to e-mail me to get your lab result from today in 1-2 weeks.   TESTS ORDERED: 1. Serum alpha-1 antitrypsin phenotype today

## 2017-03-08 NOTE — Progress Notes (Signed)
Subjective:    Patient ID: Dana Pruitt, female    DOB: 1941-06-04, 76 y.o.   MRN: [id number]  C.C.:  Follow-up for Moderate COPD and Pulmonary Hypertension.   HPI Moderate COPD:  She denies any breathing problems. No coughing or wheezing. No inhalers. No exacerbations since last appointment.   Pulmonary Hypertension: Moderate Aortic regurgitation noted as well as right ventricular dysfunction on echocardiogram. Not currently on a diuretic. She is following with Dr. [name] with Cardiology.  Review of Systems She reports no chest tightness or pain. No fever or chills. No abdominal pain or nausea.   No Known Allergies  Current Outpatient Prescriptions on File Prior to Visit  Medication Sig Dispense Refill  . atorvastatin (LIPITOR) 20 MG tablet Take 20 mg by mouth daily.     . carvedilol (COREG) 6.25 MG tablet take 1 tablet by mouth twice a day with meals 60 tablet 11  . lisinopril (PRINIVIL,ZESTRIL) 5 MG tablet take 1 tablet by mouth once daily 30 tablet 11  . sertraline (ZOLOFT) 100 MG tablet Take 200 mg by mouth every morning.    . valACYclovir (VALTREX) 1000 MG tablet 2 tablets 2 (two) times daily. Take as directed  0   No current facility-administered medications on file prior to visit.     Past Medical History:  Diagnosis Date  . Anemia    "several times over the years" (10/17/2012)  . Anxiety   . Breast cancer ([place]) 02/2010   right breast  . Carcinoma of breast treated with adjuvant hormone therapy ([place]) 9/11   Femara  . CHF (congestive heart failure) ([place])   . COPD (chronic obstructive pulmonary disease) ([place])   . Exertional shortness of breath    "recently" (10/17/2012)  . Hollenhorst plaque, left eye    apparently resolved at recent ophthalmology visit  . Hyperlipidemia   . 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 Left ventricular dysfunction   . Moderate aortic insufficiency   . Pneumonia    "that's why I'm here" (10/17/2012)    Past Surgical  History:  Procedure Laterality Date  . APPENDECTOMY  1974  . BREAST BIOPSY Right 02/27/10   Needle core Biopsy; Invasive Mammary; ER/PR Positive, Her-2 Neu negative, Ki-67 22%  . BREAST LUMPECTOMY WITH SENTINEL LYMPH NODE BIOPSY Right 07/03/11   Invasive Ductal Carcinoma;0/3 nodes negative,; ER,PR Positive, Her-2 Neg; Ki-67 22%  . [place]; 1974  . COLONOSCOPY    . DILATION AND CURETTAGE OF UTERUS  1970's  . TUBAL LIGATION  1974    Family History  Problem Relation Age of Onset  . Heart disease Mother   . Cancer Father   . Heart disease Maternal Grandmother   . Heart disease Maternal Grandfather   . Cancer Paternal Grandmother   . Lung disease Neg Hx     Social History   Social History  . Marital status: Married    Spouse name: N/A  . Number of children: 2  . Years of education: college   Occupational History  .  Retired   Social History Main Topics  . Smoking status: Former Smoker    Packs/day: 3.00    Years: 30.00    Types: Cigarettes    Start date: 05/19/1958    Quit date: 06/30/1990  . Smokeless tobacco: Never Used     Comment: Stopped smoking for 2-3 years during second child.   . Alcohol use 0.6 oz/week    1 Glasses of wine per week  Comment: 10/17/2012 "3-4 glasses of wine/month"  . Drug use: No  . Sexual activity: Yes    Birth control/ protection: Post-menopausal     Comment: Menarche age 35, G39,P2, Menopause in 59"s, No HRT Married   Other Topics Concern  . None   Social History Narrative   [facility] (12/29/16):   Originally from [place], [place]. Moved to [place] at 76 y.o. Does have a dog. Hasn't worked outside the home. Remote parakeet exposure from her children. No mold or hot tub exposure. Enjoys doing yard work & walking.       Objective:   Physical Exam BP 118/68 (BP Location: Left Arm, Cuff Size: Normal)   Pulse 78   SpO2 91%   General:  Thin, elderly female. No distress. Comfortable. Integument:  Warm & dry. No rash on  exposed skin.  Extremities:  No cyanosis or clubbing.  Lymphatics: No appreciated cervical or supraclavicular lymphadenopathy. HEENT:  Moist mucus membranes. No nasal turbinate swelling. No oral ulcers. Cardiovascular:  Regular rate. No edema. Unable to appreciate any JVD.  Pulmonary:  Clear with auscultation bilaterally. Normal work of breathing on room air. Speaking in complete sentences. Abdomen: Soft. Normal bowel sounds. Nondistended. Musculoskeletal:  Normal bulk and tone. No joint deformity or effusion appreciated.  PFT 01/04/17: FVC 2.17 L (79%) FEV1 1.32 L (64%) FEV1/FVC 0.61 FEF 25-75 0.57 L (34%) negative bronchodilator response TLC 5.23 L (105%) RV 136% ERV 39% DLCO corrected 43% 12/30/12: FVC 2.77 L (101%) FEV1 1.92 L (89%) FEV1/FVC 0.69 FEF 25-75 1.13 L (51%) negative bronchodilator response TLC 4.77 L (97%) RV 110% DLCO uncorrected 51%  6MWT 03/08/17:  Walked 414 meters / Baseline Sat 96% on RA / Nadir Sat 94% on RA (stopped with 4:37 due to poor pulse ox signal)  IMAGING CXR PA/LAT 12/29/16 (personally reviewed by me):  Slight hyperinflation suggested. No parenchymal mass or opacity appreciated. No pleural effusion. Heart normal in size & mediastinum normal in contour.  CARDIAC TTE (07/14/16):  LV normal in size with EF 60-65% & no regional wall motion abnormalities. Grade 1 diastolic dysfunction. LA & RA normal in size. RV mildly dilated with reduced systolic function. Moderate aortic regurgitation without stenosis. Ascending aorta is mildly dilated with aortic root normal in size. No mitral stenosis or regurgitation. Mild pulmonic regurgitation. No significant tricuspid regurgitation. No pericardial effusion.    Assessment & Plan:  76 y.o. female with moderate airway obstruction based on pulmonary function testing performed since last appointment. There is also some air trapping suggestive of emphysema. Her chest x-ray does not show any obvious parenchymal abnormality. Patient  notably does have moderate Aortic regurgitation on echocardiogram as well as right ventricular dysfunction suggestive of pulmonary hypertension. Despite this, the patient has no significant desaturation or oxygen requirement with her walk test today. Given minimal to no symptoms I do not feel that initiation of inhaled medications is necessary at this time. Additionally, her pulmonary hypertension is likely multifactorial and I will defer further workup or medications to cardiology. I instructed the patient contact me if she had any new breathing problems or questions before her next appointment.  1. Moderate COPD:  Screening for alpha-1 antitrypsin deficiency today. Holding on inhaler medications. 2. Pulmonary hypertension: Likely multifactorial from underlying COPD as well as valvular regurgitation. No role for pulmonary arterial vasodilator therapy at this time. 3. Health maintenance: Status post "Prevnar" January 2009. Recommended Pneumovax at office appointment tomorrow & influenza vaccine in October. 4. Follow-up: Return to clinic in 6  months or sooner if needed.  Sonia Baller Ashok Cordia, M.D. [facility] Pager:  [phone] After 3pm or if no response, call [phone] 3:03 PM 03/08/17

## 2017-03-09 ENCOUNTER — Encounter: Payer: Self-pay | Admitting: Cardiovascular Disease

## 2017-03-09 ENCOUNTER — Ambulatory Visit (INDEPENDENT_AMBULATORY_CARE_PROVIDER_SITE_OTHER): Payer: Medicare Other | Admitting: Cardiovascular Disease

## 2017-03-09 VITALS — BP 124/64 | HR 62 | Ht 64.0 in | Wt 121.0 lb

## 2017-03-09 DIAGNOSIS — I519 Heart disease, unspecified: Secondary | ICD-10-CM | POA: Diagnosis not present

## 2017-03-09 DIAGNOSIS — I351 Nonrheumatic aortic (valve) insufficiency: Secondary | ICD-10-CM

## 2017-03-09 NOTE — Assessment & Plan Note (Signed)
History of left ventricular dysfunction currently on carvedilol and lisinopril with recent echo performed on 07/14/16 revealing EF of 60-65%. This is markedly improved compared to her EF of 35-40% on 02/08/14 probably as a result of pharmacologic therapy. She denies symptoms of heart failure.

## 2017-03-09 NOTE — Progress Notes (Signed)
03/09/2017 Dana Pruitt   02-08-1941  [id number]  Primary Physician Deland Pretty, MD Primary Cardiologist: Lorretta Harp MD [name], [place]  HPI:  Dana Pruitt is a 76 y.o. female thin-appearing married Caucasian female mother of 2, grandmother of 2 grandchildren whose mother Dana Pruitt was also a patient of mine and died at the age of 86. She was referred by Dr. Deland Pretty for cardiovascular evaluation because of echocardiogram performed 02/16/14 that showed moderate left ventricular dysfunction with an ejection fraction of 35-40% and moderate aortic insufficiency.I last saw her in the office 03/11/16. Her cardiovascular risk factor profile is remarkable for remote tobacco abuse having quit in 1990 and treated hyperlipidemia. There is no family history of heart disease. She has never had a heart attack or stroke and denies chest pain or shortness of breath. She did have a Hollenhorst plaque documented by ophthalmologic exam several months ago. Carotid Dopplers performed since that time were normal. The plaque is since resolved. A 2-D echo was performed that showed an EF of 35-40% with moderate aortic insufficiency. LV size is normal and there was no LVH. She did have breast cancer documented by lumpectomy in 2013 and had chemotherapy and radiation therapy. Her oncologist was Dr. Letta Pate . She is on Femara . I began her on low-dose carvedilol and repeated her 2-D echocardiogram on 08/10/14 revealed improvement in her LV function up to 45-50%. She continues to have moderate to severe AI with normal LV size and function.. She only complains of mild dyspnea on exertion. Since I saw her year ago she should make remained clinically stable. She denies chest pain or shortness of breath. Most recent echo performed 07/14/16 revealed EF of 60-65% with normal LV size and moderate aortic insufficiency. Her Ace and the aorta measured 40 mm.   Current Meds  Medication Sig  . atorvastatin  (LIPITOR) 20 MG tablet Take 20 mg by mouth daily.   . carvedilol (COREG) 6.25 MG tablet take 1 tablet by mouth twice a day with meals  . lisinopril (PRINIVIL,ZESTRIL) 5 MG tablet take 1 tablet by mouth once daily  . sertraline (ZOLOFT) 100 MG tablet Take 200 mg by mouth every morning.  . valACYclovir (VALTREX) 1000 MG tablet 2 tablets 2 (two) times daily. Take as directed     No Known Allergies  Social History   Social History  . Marital status: Married    Spouse name: N/A  . Number of children: 2  . Years of education: college   Occupational History  .  Retired   Social History Main Topics  . Smoking status: Former Smoker    Packs/day: 3.00    Years: 30.00    Types: Cigarettes    Start date: 05/19/1958    Quit date: 06/30/1990  . Smokeless tobacco: Never Used     Comment: Stopped smoking for 2-3 years during second child.   . Alcohol use 0.6 oz/week    1 Glasses of wine per week     Comment: 10/17/2012 "3-4 glasses of wine/month"  . Drug use: No  . Sexual activity: Yes    Birth control/ protection: Post-menopausal     Comment: Menarche age 5, G66,P2, Menopause in 20"s, No HRT Married   Other Topics Concern  . Not on file   Social History Narrative   [facility] (12/29/16):   Originally from [place], [place]. Moved to [place] at 76 y.o. Does have a dog. Hasn't worked outside the home. Remote  parakeet exposure from her children. No mold or hot tub exposure. Enjoys doing yard work & walking.      Review of Systems: General: negative for chills, fever, night sweats or weight changes.  Cardiovascular: negative for chest pain, dyspnea on exertion, edema, orthopnea, palpitations, paroxysmal nocturnal dyspnea or shortness of breath Dermatological: negative for rash Respiratory: negative for cough or wheezing Urologic: negative for hematuria Abdominal: negative for nausea, vomiting, diarrhea, bright red blood per rectum, melena, or hematemesis Neurologic: negative for visual  changes, syncope, or dizziness All other systems reviewed and are otherwise negative except as noted above.    Blood pressure 124/64, pulse 62, height 5\' 4"  (1.626 m), weight 121 lb (54.9 kg).  General appearance: alert and no distress Neck: no adenopathy, no carotid bruit, no JVD, supple, symmetrical, trachea midline and thyroid not enlarged, symmetric, no tenderness/mass/nodules Lungs: clear to auscultation bilaterally Heart: regular rate and rhythm, S1, S2 normal, no murmur, click, rub or gallop Extremities: extremities normal, atraumatic, no cyanosis or edema Pulses: 2+ and symmetric Skin: Skin color, texture, turgor normal. No rashes or lesions Neurologic: Alert and oriented X 3, normal strength and tone. Normal symmetric reflexes. Normal coordination and gait  EKG sinus rhythm at 62 without ST or T-wave changes. I personally reviewed this EKG.  ASSESSMENT AND PLAN:   Left ventricular dysfunction History of left ventricular dysfunction currently on carvedilol and lisinopril with recent echo performed on 07/14/16 revealing EF of 60-65%. This is markedly improved compared to her EF of 35-40% on 02/08/14 probably as a result of pharmacologic therapy. She denies symptoms of heart failure.  Aortic insufficiency History of moderate aortic insufficiency by 2-D echo 07/14/16. LV size and function is normal. We will continue to follow her by 2-D echo on annual basis.      Lorretta Harp MD FACP,FACC,FAHA, [facility] 03/09/2017 12:22 PM

## 2017-03-09 NOTE — Assessment & Plan Note (Signed)
History of moderate aortic insufficiency by 2-D echo 07/14/16. LV size and function is normal. We will continue to follow her by 2-D echo on annual basis.

## 2017-03-09 NOTE — Patient Instructions (Signed)
Medication Instructions: Your physician recommends that you continue on your current medications as directed. Please refer to the Current Medication list given to you today.  Testing/Procedures: Your physician has requested that you have an echocardiogram--due in February 2019. Echocardiography is a painless test that uses sound waves to create images of your heart. It provides your doctor with information about the size and shape of your heart and how well your heart's chambers and valves are working. This procedure takes approximately one hour. There are no restrictions for this procedure.  Follow-Up: Your physician wants you to follow-up in: 1 year with Dr. [name]. You will receive a reminder letter in the mail two months in advance. If you don't receive a letter, please call our office to schedule the follow-up appointment.  If you need a refill on your cardiac medications before your next appointment, please call your pharmacy.

## 2017-03-11 LAB — ALPHA-1 ANTITRYPSIN PHENOTYPE: A-1 Antitrypsin, Ser: 164 mg/dL (ref 83–199)

## 2017-05-21 ENCOUNTER — Other Ambulatory Visit: Payer: Self-pay | Admitting: Cardiovascular Disease

## 2017-07-19 ENCOUNTER — Other Ambulatory Visit: Payer: Self-pay

## 2017-07-19 ENCOUNTER — Ambulatory Visit (HOSPITAL_COMMUNITY): Payer: Medicare Other | Attending: Cardiology

## 2017-07-19 DIAGNOSIS — I083 Combined rheumatic disorders of mitral, aortic and tricuspid valves: Secondary | ICD-10-CM | POA: Diagnosis not present

## 2017-07-19 DIAGNOSIS — Z87891 Personal history of nicotine dependence: Secondary | ICD-10-CM | POA: Diagnosis not present

## 2017-07-19 DIAGNOSIS — I7121 Aneurysm of the ascending aorta, without rupture: Secondary | ICD-10-CM

## 2017-07-19 DIAGNOSIS — I712 Thoracic aortic aneurysm, without rupture: Secondary | ICD-10-CM | POA: Insufficient documentation

## 2017-07-21 ENCOUNTER — Other Ambulatory Visit: Payer: Self-pay | Admitting: Cardiovascular Disease

## 2017-07-26 DIAGNOSIS — M810 Age-related osteoporosis without current pathological fracture: Secondary | ICD-10-CM | POA: Diagnosis not present

## 2017-07-29 ENCOUNTER — Other Ambulatory Visit: Payer: Self-pay | Admitting: Cardiovascular Disease

## 2017-07-29 DIAGNOSIS — I351 Nonrheumatic aortic (valve) insufficiency: Secondary | ICD-10-CM

## 2017-08-19 DIAGNOSIS — R922 Inconclusive mammogram: Secondary | ICD-10-CM | POA: Diagnosis not present

## 2017-08-19 DIAGNOSIS — Z853 Personal history of malignant neoplasm of breast: Secondary | ICD-10-CM | POA: Diagnosis not present

## 2017-12-17 DIAGNOSIS — H3563 Retinal hemorrhage, bilateral: Secondary | ICD-10-CM | POA: Diagnosis not present

## 2017-12-17 DIAGNOSIS — H2513 Age-related nuclear cataract, bilateral: Secondary | ICD-10-CM | POA: Diagnosis not present

## 2017-12-27 DIAGNOSIS — M810 Age-related osteoporosis without current pathological fracture: Secondary | ICD-10-CM | POA: Diagnosis not present

## 2017-12-27 DIAGNOSIS — E7800 Pure hypercholesterolemia, unspecified: Secondary | ICD-10-CM | POA: Diagnosis not present

## 2017-12-30 DIAGNOSIS — E7800 Pure hypercholesterolemia, unspecified: Secondary | ICD-10-CM | POA: Diagnosis not present

## 2017-12-30 DIAGNOSIS — I10 Essential (primary) hypertension: Secondary | ICD-10-CM | POA: Diagnosis not present

## 2017-12-30 DIAGNOSIS — Z0001 Encounter for general adult medical examination with abnormal findings: Secondary | ICD-10-CM | POA: Diagnosis not present

## 2017-12-30 DIAGNOSIS — M810 Age-related osteoporosis without current pathological fracture: Secondary | ICD-10-CM | POA: Diagnosis not present

## 2018-01-25 DIAGNOSIS — M810 Age-related osteoporosis without current pathological fracture: Secondary | ICD-10-CM | POA: Diagnosis not present

## 2018-03-07 ENCOUNTER — Other Ambulatory Visit: Payer: Self-pay | Admitting: Cardiovascular Disease

## 2018-03-22 DIAGNOSIS — H40013 Open angle with borderline findings, low risk, bilateral: Secondary | ICD-10-CM | POA: Diagnosis not present

## 2018-05-20 ENCOUNTER — Other Ambulatory Visit: Payer: Self-pay | Admitting: Cardiovascular Disease

## 2018-05-24 ENCOUNTER — Other Ambulatory Visit: Payer: Self-pay | Admitting: Cardiovascular Disease

## 2018-07-14 DIAGNOSIS — Z8262 Family history of osteoporosis: Secondary | ICD-10-CM | POA: Diagnosis not present

## 2018-07-14 DIAGNOSIS — Z853 Personal history of malignant neoplasm of breast: Secondary | ICD-10-CM | POA: Diagnosis not present

## 2018-07-14 DIAGNOSIS — M810 Age-related osteoporosis without current pathological fracture: Secondary | ICD-10-CM | POA: Diagnosis not present

## 2018-08-04 DIAGNOSIS — M810 Age-related osteoporosis without current pathological fracture: Secondary | ICD-10-CM | POA: Diagnosis not present

## 2018-08-22 DIAGNOSIS — Z853 Personal history of malignant neoplasm of breast: Secondary | ICD-10-CM | POA: Diagnosis not present

## 2018-08-22 DIAGNOSIS — Z1231 Encounter for screening mammogram for malignant neoplasm of breast: Secondary | ICD-10-CM | POA: Diagnosis not present

## 2018-09-13 ENCOUNTER — Telehealth: Payer: Self-pay

## 2018-09-13 NOTE — Telephone Encounter (Signed)
New message   Spoke with patient per staff messages from Dr. Radford Pax echocardiogram  has been rescheduled to 10/20/18

## 2018-09-14 ENCOUNTER — Ambulatory Visit (HOSPITAL_COMMUNITY): Payer: Medicare Other

## 2018-09-20 ENCOUNTER — Other Ambulatory Visit: Payer: Self-pay | Admitting: Cardiovascular Disease

## 2018-09-20 ENCOUNTER — Telehealth: Payer: Self-pay

## 2018-09-20 NOTE — Telephone Encounter (Addendum)
   Primary Cardiologist:  Quay Burow, MD   Patient contacted.  History reviewed.  No symptoms to suggest any unstable cardiac conditions.  Based on discussion, with current pandemic situation, we will be postponing this appointment for Dana Pruitt with a plan for f/u in 12 wks or sooner if feasible/necessary.  If symptoms change, she has been instructed to contact our office.   Routing to C19 CANCEL pool for tracking (P CV DIV CV19 CANCEL - reason for visit "other.") and assigning priority (1 = 4-6 wks, 2 = 6-12 wks, 3 = >12 wks).   [name], [place]  09/20/2018 5:03 PM         .

## 2018-09-20 NOTE — Telephone Encounter (Signed)
Called to speak with the patient about switching her scheduled appointment to a telehealth visiit which may consist of telephone or video. Patient stated that she was suppose to see Dr. [name] until after she has had her Echocardiogram. Patient stated that she will just wait until she has that done to see him. Informed patient of when her ECHO is scheduled which is 10/20/2018. Patient verbalized understanding and was told that is she is having worsening cardiac symptoms to call 911 and if she develops new symptoms to call our office.

## 2018-09-21 ENCOUNTER — Ambulatory Visit: Payer: Medicare Other | Admitting: Cardiovascular Disease

## 2018-09-28 ENCOUNTER — Telehealth: Payer: Self-pay | Admitting: Cardiovascular Disease

## 2018-09-28 NOTE — Telephone Encounter (Signed)
I did not need this encounter. °

## 2018-10-04 ENCOUNTER — Ambulatory Visit (HOSPITAL_COMMUNITY): Payer: Medicare Other | Attending: Cardiology

## 2018-10-04 ENCOUNTER — Other Ambulatory Visit: Payer: Self-pay

## 2018-10-04 DIAGNOSIS — C44311 Basal cell carcinoma of skin of nose: Secondary | ICD-10-CM | POA: Diagnosis not present

## 2018-10-04 DIAGNOSIS — D485 Neoplasm of uncertain behavior of skin: Secondary | ICD-10-CM | POA: Diagnosis not present

## 2018-10-04 DIAGNOSIS — L821 Other seborrheic keratosis: Secondary | ICD-10-CM | POA: Diagnosis not present

## 2018-10-04 DIAGNOSIS — I351 Nonrheumatic aortic (valve) insufficiency: Secondary | ICD-10-CM | POA: Insufficient documentation

## 2018-10-04 DIAGNOSIS — L814 Other melanin hyperpigmentation: Secondary | ICD-10-CM | POA: Diagnosis not present

## 2018-10-04 DIAGNOSIS — Z85828 Personal history of other malignant neoplasm of skin: Secondary | ICD-10-CM | POA: Diagnosis not present

## 2018-10-10 ENCOUNTER — Telehealth: Payer: Self-pay | Admitting: Cardiovascular Disease

## 2018-10-10 NOTE — Telephone Encounter (Signed)
LVM for pre reg °

## 2018-10-10 NOTE — Telephone Encounter (Signed)
Smartphone/ my chart via email/ virtual consent/ pre reg completed  °

## 2018-10-11 ENCOUNTER — Telehealth (INDEPENDENT_AMBULATORY_CARE_PROVIDER_SITE_OTHER): Payer: Medicare Other | Admitting: Cardiovascular Disease

## 2018-10-11 DIAGNOSIS — I519 Heart disease, unspecified: Secondary | ICD-10-CM | POA: Diagnosis not present

## 2018-10-11 DIAGNOSIS — E782 Mixed hyperlipidemia: Secondary | ICD-10-CM

## 2018-10-11 DIAGNOSIS — I351 Nonrheumatic aortic (valve) insufficiency: Secondary | ICD-10-CM

## 2018-10-11 NOTE — Telephone Encounter (Signed)
Patient and/or DPR-approved person aware of AVS instructions and verbalized understanding. Letter including After Visit Summary and any other necessary documents mailed to the patient's address on file.  

## 2018-10-11 NOTE — Patient Instructions (Signed)
Medication Instructions:  Your physician recommends that you continue on your current medications as directed. Please refer to the Current Medication list given to you today.  If you need a refill on your cardiac medications before your next appointment, please call your pharmacy.   Lab work: NONE If you have labs (blood work) drawn today and your tests are completely normal, you will receive your results only by: [name] MyChart Message (if you have MyChart) OR . A paper copy in the mail If you have any lab test that is abnormal or we need to change your treatment, we will call you to review the results.  Testing/Procedures: Your physician has requested that you have an echocardiogram. Echocardiography is a painless test that uses sound waves to create images of your heart. It provides your doctor with information about the size and shape of your heart and how well your heart's chambers and valves are working. This procedure takes approximately one hour. There are no restrictions for this procedure. [place], [employer] [place],  [id number]    Follow-Up: At [facility], you and your health needs are our priority.  As part of our continuing mission to provide you with exceptional heart care, we have created designated Provider Care Teams.  These Care Teams include your primary Cardiologist (physician) and Advanced Practice Providers (APPs -  Physician Assistants and Nurse Practitioners) who all work together to provide you with the care you need, when you need it. You will need a follow up appointment in 12 months WITH DR. [name].  Please call our office 2 months in advance to schedule this appointment.

## 2018-10-11 NOTE — Progress Notes (Signed)
Virtual Visit via Telephone Note   This visit type was conducted due to national recommendations for restrictions regarding the COVID-19 Pandemic (e.g. social distancing) in an effort to limit this patient's exposure and mitigate transmission in our community.  Due to her co-morbid illnesses, this patient is at least at moderate risk for complications without adequate follow up.  This format is felt to be most appropriate for this patient at this time.  The patient did not have access to video technology/had technical difficulties with video requiring transitioning to audio format only (telephone).  All issues noted in this document were discussed and addressed.  No physical exam could be performed with this format.  Please refer to the patient's chart for her  consent to telehealth for [facility].   Evaluation Performed:  Follow-up visit  Date:  10/11/2018   ID:  Dana Pruitt, DOB 1941/05/29, MRN [id number]  Patient Location: Home Provider Location: Office  PCP:  Deland Pretty, MD  Cardiologist:  Quay Burow, MD  Electrophysiologist:  None   Chief Complaint: Aortic insufficiency, follow-up 2D echocardiogram  History of Present Illness:     Dana Pruitt is a 78y.o. female thin-appearing married Caucasian female mother of 2, grandmother of 2 grandchildren whose mother [name] was also a patient of mine and died at the age of 58. She was referred by Dr. Deland Pretty for cardiovascular evaluation because of echocardiogram performed 02/16/14 that showed moderate left ventricular dysfunction with an ejection fraction of 35-40% and moderate aortic insufficiency.I last saw her in the office 03/09/2017. Her cardiovascular risk factor profile is remarkable for remote tobacco abuse having quit in 1990 and treated hyperlipidemia. There is no family history of heart disease. She has never had a heart attack or stroke and denies chest pain or shortness of breath. She did have a  Hollenhorst plaque documented by ophthalmologic exam several months ago. Carotid Dopplers performed since that time were normal. The plaque is since resolved. A 2-D echo was performed that showed an EF of 35-40% with moderate aortic insufficiency. LV size is normal and there was no LVH. She did have breast cancer documented by lumpectomy in 2013 and had chemotherapy and radiation therapy. Her oncologist was Dr. Letta Pate . She is on Femara . I began her on low-dose carvedilol and repeated her 2-D echocardiogram on 08/10/14 revealed improvement in her LV function up to 45-50%. She continues to have moderate to severe AI with normal LV size and function.. She only complains of mild dyspnea on exertion. Since I saw her year ago she should make remained clinically stable. She denies chest pain or shortness of breath. Most recent echo performed 10/04/2018 revealed EF of 40 to 45% with normal LV size with severe aortic insufficiency.  She continues to deny symptoms of shortness of breath.  The patient does not have symptoms concerning for COVID-19 infection (fever, chills, cough, or new shortness of breath).    Past Medical History:  Diagnosis Date  . Anemia    "several times over the years" (10/17/2012)  . Anxiety   . Breast cancer ([place]) 02/2010   right breast  . Carcinoma of breast treated with adjuvant hormone therapy ([place]) 9/11   Femara  . CHF (congestive heart failure) ([place])   . COPD (chronic obstructive pulmonary disease) ([place])   . Exertional shortness of breath    "recently" (10/17/2012)  . Hollenhorst plaque, left eye    apparently resolved at recent ophthalmology visit  . Hyperlipidemia   .  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 Left ventricular dysfunction   . Moderate aortic insufficiency   . Pneumonia    "that's why I'm here" (10/17/2012)   Past Surgical History:  Procedure Laterality Date  . APPENDECTOMY  1974  . BREAST BIOPSY Right 02/27/10   Needle core Biopsy; Invasive  Mammary; ER/PR Positive, Her-2 Neu negative, Ki-67 22%  . BREAST LUMPECTOMY WITH SENTINEL LYMPH NODE BIOPSY Right 07/03/11   Invasive Ductal Carcinoma;0/3 nodes negative,; ER,PR Positive, Her-2 Neg; Ki-67 22%  . [place]; 1974  . COLONOSCOPY    . DILATION AND CURETTAGE OF UTERUS  1970's  . TUBAL LIGATION  1974     Current Meds  Medication Sig  . atorvastatin (LIPITOR) 20 MG tablet Take 20 mg by mouth daily.   . carvedilol (COREG) 6.25 MG tablet TAKE 1 TABLET(6.25 MG) BY MOUTH TWICE DAILY WITH A MEAL  . lisinopril (PRINIVIL,ZESTRIL) 5 MG tablet TAKE 1 TABLET BY MOUTH ONCE DAILY  . sertraline (ZOLOFT) 100 MG tablet Take 100 mg by mouth 2 (two) times a day.   . [DISCONTINUED] valACYclovir (VALTREX) 1000 MG tablet 2 tablets 2 (two) times daily. Take as directed     Allergies:   Patient has no known allergies.   Social History   Tobacco Use  . Smoking status: Former Smoker    Packs/day: 3.00    Years: 30.00    Pack years: 90.00    Types: Cigarettes    Start date: 05/19/1958    Last attempt to quit: 06/30/1990    Years since quitting: 28.3  . Smokeless tobacco: Never Used  . Tobacco comment: Stopped smoking for 2-3 years during second child.   Substance Use Topics  . Alcohol use: Yes    Alcohol/week: 1.0 standard drinks    Types: 1 Glasses of wine per week    Comment: 10/17/2012 "3-4 glasses of wine/month"  . Drug use: No     Family Hx: The patient's family history includes Cancer in her father and paternal grandmother; Heart disease in her maternal grandfather, maternal grandmother, and mother. There is no history of Lung disease.  ROS:   Please see the history of present illness.     All other systems reviewed and are negative.   Prior CV studies:   The following studies were reviewed today:  2D echocardiogram  Labs/Other Tests and Data Reviewed:    EKG:  No ECG reviewed.  Recent Labs: No results found for requested labs within last 8760 hours.    Recent Lipid Panel Lab Results  Component Value Date/Time   CHOL 197 02/16/2014 08:26 AM   TRIG 88 02/16/2014 08:26 AM   HDL 52 02/16/2014 08:26 AM   CHOLHDL 3.8 02/16/2014 08:26 AM   LDLCALC 127 (H) 02/16/2014 08:26 AM    Wt Readings from Last 3 Encounters:  10/11/18 120 lb (54.4 kg)  03/09/17 121 lb (54.9 kg)  12/29/16 120 lb 3.2 oz (54.5 kg)     Objective:    Vital Signs:  Ht _0  (1.626 m)   Wt 120 lb (54.4 kg)   BMI 20.60 kg/m    GEN:  no acute distress RESPIRATORY:  normal respiratory effort, symmetric expansion PSYCH:  normal affect  ASSESSMENT & PLAN:    1. Nonischemic cardiomyopathy- recent 2D echo performed 10/04/2018 revealed a slight decline in her ejection fraction of the last year and a half.  It was 50 to 55% on her echo 07/19/2016.  It has declined  to 40 to 45% on 10/04/2018.  She remains completely asymptomatic on carvedilol and lisinopril.  Her LV dimensions have likewise remain normal. 2. Aortic insufficiency- continues to have severe aortic insufficiency with the absence of symptoms.  LV dimensions have remained unchanged.  We will continue to follow her on an annual basis 3. Hyperlipidemia- history of hyperlipidemia on statin therapy with lipid profile performed 12/27/2017 revealing a total cholesterol 148, LDL of 82 and HDL 45  COVID-19 Education: The signs and symptoms of COVID-19 were discussed with the patient and how to seek care for testing (follow up with PCP or arrange E-visit).  The importance of social distancing was discussed today.  Time:   Today, I have spent 12 minutes with the patient with telehealth technology discussing the above problems.     Medication Adjustments/Labs and Tests Ordered: Current medicines are reviewed at length with the patient today.  Concerns regarding medicines are outlined above.   Tests Ordered: No orders of the defined types were placed in this encounter.   Medication Changes: No orders of the defined types  were placed in this encounter.   Disposition:  Follow up in 1 year(s)  Signed, Quay Burow, MD  10/11/2018 10:08 AM    [facility]

## 2018-10-17 DIAGNOSIS — C44311 Basal cell carcinoma of skin of nose: Secondary | ICD-10-CM | POA: Diagnosis not present

## 2018-10-17 DIAGNOSIS — Z85828 Personal history of other malignant neoplasm of skin: Secondary | ICD-10-CM | POA: Diagnosis not present

## 2018-10-19 NOTE — Progress Notes (Signed)
Patient stated she was made aware of her results by Dr. [name] when she had her telehealth visit on 10/11/2018. [name], [place] 10/19/2018 11:39 AM

## 2018-10-20 ENCOUNTER — Other Ambulatory Visit (HOSPITAL_COMMUNITY): Payer: Medicare Other

## 2018-11-11 DIAGNOSIS — H40013 Open angle with borderline findings, low risk, bilateral: Secondary | ICD-10-CM | POA: Diagnosis not present

## 2018-12-16 ENCOUNTER — Encounter (HOSPITAL_COMMUNITY): Payer: Self-pay

## 2018-12-16 ENCOUNTER — Inpatient Hospital Stay (HOSPITAL_COMMUNITY)
Admission: EM | Admit: 2018-12-16 | Discharge: 2018-12-18 | DRG: 304 | Disposition: A | Discharge status: Home / Self Care | Payer: Medicare Other | Attending: Internal Medicine | Admitting: Internal Medicine

## 2018-12-16 ENCOUNTER — Other Ambulatory Visit: Payer: Self-pay

## 2018-12-16 ENCOUNTER — Emergency Department (HOSPITAL_COMMUNITY): Payer: Medicare Other

## 2018-12-16 DIAGNOSIS — I2510 Atherosclerotic heart disease of native coronary artery without angina pectoris: Secondary | ICD-10-CM | POA: Diagnosis present

## 2018-12-16 DIAGNOSIS — Z8249 Family history of ischemic heart disease and other diseases of the circulatory system: Secondary | ICD-10-CM

## 2018-12-16 DIAGNOSIS — Z853 Personal history of malignant neoplasm of breast: Secondary | ICD-10-CM | POA: Diagnosis not present

## 2018-12-16 DIAGNOSIS — R0602 Shortness of breath: Secondary | ICD-10-CM | POA: Diagnosis not present

## 2018-12-16 DIAGNOSIS — I5022 Chronic systolic (congestive) heart failure: Secondary | ICD-10-CM | POA: Diagnosis present

## 2018-12-16 DIAGNOSIS — I248 Other forms of acute ischemic heart disease: Secondary | ICD-10-CM | POA: Diagnosis present

## 2018-12-16 DIAGNOSIS — F419 Anxiety disorder, unspecified: Secondary | ICD-10-CM | POA: Diagnosis not present

## 2018-12-16 DIAGNOSIS — Z79899 Other long term (current) drug therapy: Secondary | ICD-10-CM

## 2018-12-16 DIAGNOSIS — J449 Chronic obstructive pulmonary disease, unspecified: Secondary | ICD-10-CM | POA: Diagnosis present

## 2018-12-16 DIAGNOSIS — I272 Pulmonary hypertension, unspecified: Secondary | ICD-10-CM

## 2018-12-16 DIAGNOSIS — I160 Hypertensive urgency: Principal | ICD-10-CM

## 2018-12-16 DIAGNOSIS — Z7982 Long term (current) use of aspirin: Secondary | ICD-10-CM

## 2018-12-16 DIAGNOSIS — Z9981 Dependence on supplemental oxygen: Secondary | ICD-10-CM

## 2018-12-16 DIAGNOSIS — I351 Nonrheumatic aortic (valve) insufficiency: Secondary | ICD-10-CM | POA: Diagnosis present

## 2018-12-16 DIAGNOSIS — R079 Chest pain, unspecified: Secondary | ICD-10-CM | POA: Diagnosis not present

## 2018-12-16 DIAGNOSIS — I428 Other cardiomyopathies: Secondary | ICD-10-CM | POA: Diagnosis present

## 2018-12-16 DIAGNOSIS — Z87891 Personal history of nicotine dependence: Secondary | ICD-10-CM | POA: Diagnosis not present

## 2018-12-16 DIAGNOSIS — N390 Urinary tract infection, site not specified: Secondary | ICD-10-CM | POA: Diagnosis present

## 2018-12-16 DIAGNOSIS — R0789 Other chest pain: Secondary | ICD-10-CM | POA: Diagnosis not present

## 2018-12-16 DIAGNOSIS — I5042 Chronic combined systolic (congestive) and diastolic (congestive) heart failure: Secondary | ICD-10-CM | POA: Diagnosis present

## 2018-12-16 DIAGNOSIS — E785 Hyperlipidemia, unspecified: Secondary | ICD-10-CM | POA: Diagnosis present

## 2018-12-16 DIAGNOSIS — J9601 Acute respiratory failure with hypoxia: Secondary | ICD-10-CM | POA: Diagnosis not present

## 2018-12-16 DIAGNOSIS — I5043 Acute on chronic combined systolic (congestive) and diastolic (congestive) heart failure: Secondary | ICD-10-CM

## 2018-12-16 DIAGNOSIS — I359 Nonrheumatic aortic valve disorder, unspecified: Secondary | ICD-10-CM

## 2018-12-16 DIAGNOSIS — Z20828 Contact with and (suspected) exposure to other viral communicable diseases: Secondary | ICD-10-CM | POA: Diagnosis present

## 2018-12-16 DIAGNOSIS — J438 Other emphysema: Secondary | ICD-10-CM | POA: Diagnosis not present

## 2018-12-16 DIAGNOSIS — R0902 Hypoxemia: Secondary | ICD-10-CM | POA: Diagnosis not present

## 2018-12-16 DIAGNOSIS — I110 Hypertensive heart disease with heart failure: Secondary | ICD-10-CM | POA: Diagnosis present

## 2018-12-16 DIAGNOSIS — A419 Sepsis, unspecified organism: Secondary | ICD-10-CM | POA: Diagnosis present

## 2018-12-16 DIAGNOSIS — E782 Mixed hyperlipidemia: Secondary | ICD-10-CM | POA: Diagnosis not present

## 2018-12-16 LAB — BASIC METABOLIC PANEL
Anion gap: 8 (ref 5–15)
BUN: 17 mg/dL (ref 8–23)
CO2: 24 mmol/L (ref 22–32)
Calcium: 9.7 mg/dL (ref 8.9–10.3)
Chloride: 107 mmol/L (ref 98–111)
Creatinine, Ser: 1.01 mg/dL — ABNORMAL HIGH (ref 0.44–1.00)
GFR calc Af Amer: >60 mL/min (ref >60)
GFR calc non Af Amer: 54 mL/min — ABNORMAL LOW (ref >60)
Glucose, Bld: 110 mg/dL — ABNORMAL HIGH (ref 70–99)
Potassium: 4.3 mmol/L (ref 3.5–5.1)
Sodium: 139 mmol/L (ref 135–145)

## 2018-12-16 LAB — MAGNESIUM: Magnesium: 2 mg/dL (ref 1.7–2.4)

## 2018-12-16 LAB — CREATININE, SERUM
Creatinine, Ser: 0.98 mg/dL (ref 0.44–1.00)
GFR calc Af Amer: >60 mL/min (ref >60)
GFR calc non Af Amer: 56 mL/min — ABNORMAL LOW (ref >60)

## 2018-12-16 LAB — CBC
HCT: 39.9 % (ref 36.0–46.0)
HCT: 37.9 % (ref 36.0–46.0)
Hemoglobin: 12.8 g/dL (ref 12.0–15.0)
Hemoglobin: 12.4 g/dL (ref 12.0–15.0)
MCH: 30.9 pg (ref 26.0–34.0)
MCH: 31.6 pg (ref 26.0–34.0)
MCHC: 32.1 g/dL (ref 30.0–36.0)
MCHC: 32.7 g/dL (ref 30.0–36.0)
MCV: 96.4 fL (ref 80.0–100.0)
MCV: 96.4 fL (ref 80.0–100.0)
Platelets: 212 10*3/uL (ref 150–400)
Platelets: 207 10*3/uL (ref 150–400)
RBC: 4.14 MIL/uL (ref 3.87–5.11)
RBC: 3.93 MIL/uL (ref 3.87–5.11)
RDW: 13.4 % (ref 11.5–15.5)
RDW: 13.2 % (ref 11.5–15.5)
WBC: 5.4 10*3/uL (ref 4.0–10.5)
WBC: 5.4 10*3/uL (ref 4.0–10.5)
nRBC: 0 % (ref 0.0–0.2)
nRBC: 0 % (ref 0.0–0.2)

## 2018-12-16 LAB — HEPATIC FUNCTION PANEL
ALT: 15 U/L (ref 0–44)
AST: 17 U/L (ref 15–41)
Albumin: 4 g/dL (ref 3.5–5.0)
Alkaline Phosphatase: 35 U/L — ABNORMAL LOW (ref 38–126)
Bilirubin, Direct: 0.1 mg/dL (ref 0.0–0.2)
Indirect Bilirubin: 0.6 mg/dL (ref 0.3–0.9)
Total Bilirubin: 0.7 mg/dL (ref 0.3–1.2)
Total Protein: 7 g/dL (ref 6.5–8.1)

## 2018-12-16 LAB — TROPONIN I (HIGH SENSITIVITY)
Troponin I (High Sensitivity): 10 ng/L (ref <18)
Troponin I (High Sensitivity): 8 ng/L (ref <18)
Troponin I (High Sensitivity): 10 ng/L (ref <18)
Troponin I (High Sensitivity): 10 ng/L (ref <18)

## 2018-12-16 LAB — SARS CORONAVIRUS 2 BY RT PCR (HOSPITAL ORDER, PERFORMED IN ~~LOC~~ HOSPITAL LAB): SARS Coronavirus 2: NEGATIVE

## 2018-12-16 LAB — BRAIN NATRIURETIC PEPTIDE: B Natriuretic Peptide: 321.3 pg/mL — ABNORMAL HIGH (ref 0.0–100.0)

## 2018-12-16 LAB — D-DIMER, QUANTITATIVE: D-Dimer, Quant: 0.38 ug/mL-FEU (ref 0.00–0.50)

## 2018-12-16 MED ORDER — CARVEDILOL 6.25 MG PO TABS
6.2500 mg | ORAL_TABLET | Freq: Two times a day (BID) | ORAL | Status: DC
  Administered 2018-12-16 – 2018-12-18 (×4): 6.25 mg via ORAL
  Filled 2018-12-16 (×4): qty 1

## 2018-12-16 MED ORDER — SERTRALINE HCL 100 MG PO TABS
100.0000 mg | ORAL_TABLET | Freq: Two times a day (BID) | ORAL | Status: DC
  Administered 2018-12-17 – 2018-12-18 (×3): 100 mg via ORAL
  Filled 2018-12-16 (×4): qty 1

## 2018-12-16 MED ORDER — ENOXAPARIN SODIUM 40 MG/0.4ML ~~LOC~~ SOLN
40.0000 mg | SUBCUTANEOUS | Status: DC
  Administered 2018-12-16 – 2018-12-17 (×2): 40 mg via SUBCUTANEOUS
  Filled 2018-12-16 (×2): qty 0.4

## 2018-12-16 MED ORDER — ACETAMINOPHEN 325 MG PO TABS: 650.0000 mg | ORAL_TABLET | ORAL | Status: DC | PRN

## 2018-12-16 MED ORDER — ONDANSETRON HCL 4 MG/2ML IJ SOLN: 4.0000 mg | Freq: Four times a day (QID) | INTRAMUSCULAR | Status: DC | PRN

## 2018-12-16 MED ORDER — ASPIRIN 81 MG PO CHEW
324.0000 mg | CHEWABLE_TABLET | ORAL | Status: AC
  Administered 2018-12-16: 324 mg via ORAL
  Filled 2018-12-16: qty 4

## 2018-12-16 MED ORDER — ASPIRIN EC 81 MG PO TBEC: 81.0000 mg | DELAYED_RELEASE_TABLET | Freq: Every day | ORAL | Status: DC

## 2018-12-16 MED ORDER — ATORVASTATIN CALCIUM 20 MG PO TABS
20.0000 mg | ORAL_TABLET | Freq: Every day | ORAL | Status: DC
  Administered 2018-12-17 – 2018-12-18 (×2): 20 mg via ORAL
  Filled 2018-12-16 (×2): qty 1

## 2018-12-16 MED ORDER — ASPIRIN EC 81 MG PO TBEC
81.0000 mg | DELAYED_RELEASE_TABLET | Freq: Every day | ORAL | Status: DC
  Administered 2018-12-17 – 2018-12-18 (×2): 81 mg via ORAL
  Filled 2018-12-16 (×2): qty 1

## 2018-12-16 MED ORDER — SODIUM CHLORIDE 0.9% FLUSH
3.0000 mL | Freq: Once | INTRAVENOUS | Status: AC
  Administered 2018-12-16: 3 mL via INTRAVENOUS

## 2018-12-16 MED ORDER — ASPIRIN 300 MG RE SUPP
300.0000 mg | RECTAL | Status: AC
  Filled 2018-12-16: qty 1

## 2018-12-16 MED ORDER — NITROGLYCERIN 0.4 MG SL SUBL: 0.4000 mg | SUBLINGUAL_TABLET | SUBLINGUAL | Status: DC | PRN

## 2018-12-16 MED ORDER — HYDRALAZINE HCL 20 MG/ML IJ SOLN: 10.0000 mg | Freq: Four times a day (QID) | INTRAMUSCULAR | Status: DC | PRN

## 2018-12-16 MED ORDER — LISINOPRIL 5 MG PO TABS: 5.0000 mg | ORAL_TABLET | Freq: Every day | ORAL | Status: DC

## 2018-12-16 MED ORDER — ASPIRIN 81 MG PO CHEW: 324.0000 mg | CHEWABLE_TABLET | Freq: Once | ORAL | Status: DC

## 2018-12-16 NOTE — Progress Notes (Signed)
Spoke with Dr. Doristine Bosworth  Pt is low risk Covid--PUI. Droplet/Contact PPE discontinued. SRP, RN

## 2018-12-16 NOTE — ED Notes (Signed)
Rpt called to [facility]

## 2018-12-16 NOTE — ED Triage Notes (Signed)
Patient c/o chest tightness, an infrequent non productive cough, and low sats at home between 87 and 92 x 3-4 days. Sats in triage 96 % on room air.  Patient denies diaiphoresis, N/v/D or fever.

## 2018-12-16 NOTE — H&P (Addendum)
History and Physical    Dana Pruitt HBZ:[id number] DOB: 08-Nov-1940 DOA: 12/16/2018  PCP: Deland Pretty, MD  Patient coming from: Home  I have personally briefly reviewed patient's old medical records in [place]  Chief Complaint: Chest pain  HPI: Dana Pruitt is a 78 y.o. female with medical history significant of hypertension, hyperlipidemia, anxiety, chronic systolic congestive heart failure, COPD and severe aortic insufficiency presented to ER with a complaint of chest pain.  According to patient, she started chest pain about 3 days ago.  She describes pain as pressure, 2-3 out of 10, nonradiating with no aggravating or relieving factor and has been intermittent.  She has some shortness of breath, nausea and some chronic cough due to GERD.  She tells me that she has been taking her oxygen saturation at home and she has been ranging anywhere from 87% to 93% for past 3 days.  She denies any fever, dizziness, diaphoresis, any sick contact or any recent travel.  ED Course: Upon arrival to ED, she had significantly elevated blood pressure and some bradycardia but otherwise she was stable.  Basic lab work including CBC and CMP was within normal limit as well as troponin were negative.  BNP was slightly elevated.  ED physician discussed the case with cardiologist as she has already established relationship with them and they recommended admitting for observation and they will see her in consultation tomorrow.  No therapeutic anticoagulation was recommended.  Review of Systems: As per HPI otherwise 10 point review of systems negative.    Past Medical History:  Diagnosis Date   Anemia    "several times over the years" (10/17/2012)   Anxiety    Breast cancer ([place]) 02/2010   right breast   Carcinoma of breast treated with adjuvant hormone therapy ([place]) 9/11   Femara   CHF (congestive heart failure) ([facility])    COPD (chronic obstructive pulmonary disease) ([facility])    Exertional  shortness of breath    "recently" (10/17/2012)   Hollenhorst plaque, left eye    apparently resolved at recent ophthalmology visit   Hyperlipidemia    IDC, RightStage II, 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    Past Surgical History:  Procedure Laterality Date   APPENDECTOMY  1974   BREAST BIOPSY Right 02/27/10   Needle core Biopsy; Invasive Mammary; ER/PR Positive, Her-2 Neu negative, Ki-67 22%   BREAST LUMPECTOMY WITH SENTINEL LYMPH NODE BIOPSY Right 07/03/11   Invasive Ductal Carcinoma;0/3 nodes negative,; ER,PR Positive, Her-2 Neg; Ki-67 22%   CESAREAN SECTION  1970; 1974   COLONOSCOPY     DILATION AND CURETTAGE OF UTERUS  1970's   TUBAL LIGATION  1974     reports that she quit smoking about 28 years ago. Her smoking use included cigarettes. She started smoking about 60 years ago. She has a 90.00 pack-year smoking history. She has never used smokeless tobacco. She reports current alcohol use of about 1.0 standard drinks of alcohol per week. She reports that she does not use drugs.  No Known Allergies  Family History  Problem Relation Age of Onset   Heart disease Mother    Cancer Father    Heart disease Maternal Grandmother    Heart disease Maternal Grandfather    Cancer Paternal Grandmother    Lung disease Neg Hx     Prior to Admission medications   Medication  Sig Start Date End Date Taking? Authorizing Provider  aspirin EC 81 MG tablet Take 81 mg by mouth daily.   Yes [provider]  atorvastatin (LIPITOR) 20 MG tablet Take 20 mg by mouth daily.  02/12/14  Yes [provider]  carvedilol (COREG) 6.25 MG tablet TAKE 1 TABLET(6.25 MG) BY MOUTH TWICE DAILY WITH A MEAL Patient taking differently: Take 6.25 mg by mouth 2 (two) times daily with a meal.  09/20/18  Yes Lorretta Harp, MD  lisinopril (PRINIVIL,ZESTRIL) 5 MG tablet TAKE 1  TABLET BY MOUTH ONCE DAILY 03/07/18  Yes Lorretta Harp, MD  sertraline (ZOLOFT) 100 MG tablet Take 100 mg by mouth 2 (two) times a day.    Yes [provider]  vitamin B-12 (CYANOCOBALAMIN) 500 MCG tablet Take 500 mcg by mouth daily.   Yes [provider]    Physical Exam: Vitals:   12/16/18 1230 12/16/18 1300 12/16/18 1330 12/16/18 1439  BP: (!) 159/67 (!) 164/59 (!) 161/58 (!) 161/58  Pulse: 65 63 68 66  Resp: _0 Temp:      TempSrc:      SpO2: (!) 89% 90% (!) 87% 94%  Weight:      Height:        Constitutional: NAD, calm, comfortable Vitals:   12/16/18 1230 12/16/18 1300 12/16/18 1330 12/16/18 1439  BP: (!) 159/67 (!) 164/59 (!) 161/58 (!) 161/58  Pulse: 65 63 68 66  Resp: _1 Temp:      TempSrc:      SpO2: (!) 89% 90% (!) 87% 94%  Weight:      Height:       Eyes: PERRL, lids and conjunctivae normal ENMT: Mucous membranes are moist. Posterior pharynx clear of any exudate or lesions.Normal dentition.  Neck: normal, supple, no masses, no thyromegaly Respiratory: clear to auscultation bilaterally, no wheezing, no crackles. Normal respiratory effort. No accessory muscle use.  Cardiovascular: Regular rate and rhythm, no murmurs / rubs / gallops. No extremity edema. 2+ pedal pulses. No carotid bruits.  Abdomen: no tenderness, no masses palpated. No hepatosplenomegaly. Bowel sounds positive.  Musculoskeletal: no clubbing / cyanosis. No joint deformity upper and lower extremities. Good ROM, no contractures. Normal muscle tone.  Skin: no rashes, lesions, ulcers. No induration Neurologic: CN 2-12 grossly intact. Sensation intact, DTR normal. Strength 5/5 in all 4.  Psychiatric: Normal judgment and insight. Alert and oriented x 3. Normal mood.    Labs on Admission: I have personally reviewed following labs and imaging studies  CBC: Recent Labs  Lab 12/16/18 0934  WBC 5.4  HGB 12.8  HCT 39.9  MCV 96.4  PLT 681   Basic Metabolic  Panel: Recent Labs  Lab 12/16/18 0934  NA 139  K 4.3  CL 107  CO2 24  GLUCOSE 110*  BUN 17  CREATININE 1.01*  CALCIUM 9.7   GFR: Estimated Creatinine Clearance: 40.7 mL/min (A) (by C-G formula based on SCr of 1.01 mg/dL (H)). Liver Function Tests: Recent Labs  Lab 12/16/18 0934  AST 17  ALT 15  ALKPHOS 35*  BILITOT 0.7  PROT 7.0  ALBUMIN 4.0   No results for input(s): LIPASE, AMYLASE in the last 168 hours. No results for input(s): AMMONIA in the last 168 hours. Coagulation Profile: No results for input(s): INR, PROTIME in the last 168 hours. Cardiac Enzymes: No results for input(s): CKTOTAL, CKMB, CKMBINDEX, TROPONINI in the last 168 hours. BNP (last 3 results) No  results for input(s): PROBNP in the last 8760 hours. HbA1C: No results for input(s): HGBA1C in the last 72 hours. CBG: No results for input(s): GLUCAP in the last 168 hours. Lipid Profile: No results for input(s): CHOL, HDL, LDLCALC, TRIG, CHOLHDL, LDLDIRECT in the last 72 hours. Thyroid Function Tests: No results for input(s): TSH, T4TOTAL, FREET4, T3FREE, THYROIDAB in the last 72 hours. Anemia Panel: No results for input(s): VITAMINB12, FOLATE, FERRITIN, TIBC, IRON, RETICCTPCT in the last 72 hours. Urine analysis:    Component Value Date/Time   COLORURINE YELLOW 10/17/2012 1738   APPEARANCEUR CLEAR 10/17/2012 1738   LABSPEC 1.015 10/17/2012 1738   PHURINE 6.5 10/17/2012 1738   GLUCOSEU NEGATIVE 10/17/2012 1738   HGBUR NEGATIVE 10/17/2012 1738   BILIRUBINUR NEGATIVE 10/17/2012 1738   KETONESUR NEGATIVE 10/17/2012 1738   PROTEINUR 30 (A) 10/17/2012 1738   UROBILINOGEN 0.2 10/17/2012 1738   NITRITE NEGATIVE 10/17/2012 1738   LEUKOCYTESUR NEGATIVE 10/17/2012 1738    Radiological Exams on Admission: Dg Chest 2 View  Result Date: 12/16/2018 CLINICAL DATA:  Chest tightness. History of breast carcinoma. Decreased oxygen saturation EXAM: CHEST - 2 VIEW COMPARISON:  December 29, 2016 FINDINGS: There is no  edema or consolidation. The heart size and pulmonary vascularity are normal. No adenopathy. There is aortic atherosclerosis. No bone lesions. IMPRESSION: No edema or consolidation. Stable cardiac silhouette. Aortic Atherosclerosis (ICD10-I70.0). Electronically Signed   By: Lowella Grip III M.D.   On: 12/16/2018 09:59    EKG: Independently reviewed.  Sinus rhythm with LVH  Assessment/Plan Active Problems:   HLD (hyperlipidemia)   Severe aortic regurgitation   Chest pain at rest   Chronic systolic CHF (congestive heart failure) ([place])   Hypertensive urgency    Chest pain/dyspnea: Chest x-ray normal and so far troponin.  BNP slightly elevated but lung exam is clear.  Troponin x2 are negative.  I will follow 2 more troponins.  Admit for observation and monitor on telemetry.  EKG without acute ST-T wave changes.  Cardiology consulted and they will see her in the morning.  I will give her 1 chewable full dose aspirin.  COVID-19 pending.  Hyperlipidemia: Resume home statin.  Hypertensive urgency: Resume home antihypertensives and add PRN IV hydralazine.  Chronic systolic congestive heart failure: Not hypoxic and lungs clear on auscultation with normal chest x-ray.  I do not think she is having acute exacerbation but she also has slightly elevated BNP.  Severe aortic insufficiency: This may be the reason of her chest pain.  We will defer to cardiology for management of this.  DVT prophylaxis: Lovenox Code Status: Full code Family Communication: None present at bedside.  Patient alert, oriented and competent.  Discussed plan of care with the patient. Disposition Plan: Likely home in next 1 to 2 days Consults called: Cardiology Admission status: Observation   Darliss Cheney MD [facility] Pager [phone]  If 7PM-7AM, please contact night-coverage [email or web address] Password [facility]  12/16/2018, 3:23 PM

## 2018-12-16 NOTE — ED Provider Notes (Signed)
[facility] DEPT Provider Note   CSN: [id number] Arrival date & time: 12/16/18  0920    History   Chief Complaint Chief Complaint  Patient presents with  . Chest Pain  . Cough    HPI Dana Pruitt is a 78 y.o. female.     78yo F w/ PMH including CHF EF 40-45%, COPD, breast cancer, aortic insufficiency, HLD who p/w chest tightness.  Patient reports 3 to 4 days of central chest tightness that is relatively constant but gets worse with exertion.  She has mild associated shortness of breath.  No nausea, vomiting, diaphoresis, or fevers.  She has a pulse ox at home and has noted that it has been hanging in the low 90s, occasionally dipping down to 87 with exertion.  She has not noticed any leg swelling, orthopnea, or paroxysmal nocturnal dyspnea.  She has a mild, chronic, dry cough that has been unchanged recently, associated with some sinus drainage.  No sick contacts or recent travel.  No history of blood clots or hormone use.  She is compliant with medications.   Chest Pain Associated symptoms: cough   Cough Associated symptoms: chest pain     Past Medical History:  Diagnosis Date  . Anemia    "several times over the years" (10/17/2012)  . Anxiety   . Breast cancer ([place]) 02/2010   right breast  . Carcinoma of breast treated with adjuvant hormone therapy ([place]) 9/11   [facility]  . CHF (congestive heart failure) ([place])   . COPD (chronic obstructive pulmonary disease) ([place])   . Exertional shortness of breath    "recently" (10/17/2012)  . Hollenhorst plaque, left eye    apparently resolved at recent ophthalmology visit  . Hyperlipidemia   . 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 Left ventricular dysfunction   . Moderate aortic insufficiency   . Pneumonia    "that's why I'm here" (10/17/2012)    Patient Active Problem List   Diagnosis Date Noted  . Chest pain at rest 12/16/2018  . Chronic systolic CHF (congestive heart failure) ([place])  12/16/2018  . Hypertensive urgency 12/16/2018  . Severe aortic regurgitation 09/05/2014  . Left ventricular dysfunction 05/11/2014  . HLD (hyperlipidemia) 03/28/2014  . Hollenhorst plaque, left eye 02/15/2014  . Osteopenia 11/16/2013  . COPD (chronic obstructive pulmonary disease) ([place]) 11/22/2012  . Depression 10/17/2012  . Dehydration 10/17/2012  . Debility 10/17/2012  . Primary cancer of upper outer quadrant of right female breast ([place]) 02/27/2010    Past Surgical History:  Procedure Laterality Date  . APPENDECTOMY  1974  . BREAST BIOPSY Right 02/27/10   Needle core Biopsy; Invasive Mammary; ER/PR Positive, Her-2 Neu negative, Ki-67 22%  . BREAST LUMPECTOMY WITH SENTINEL LYMPH NODE BIOPSY Right 07/03/11   Invasive Ductal Carcinoma;0/3 nodes negative,; ER,PR Positive, Her-2 Neg; Ki-67 22%  . [place]; 1974  . COLONOSCOPY    . DILATION AND CURETTAGE OF UTERUS  1970's  . TUBAL LIGATION  1974     OB History    Gravida  2   Para  2   Term      Preterm      AB      Living        SAB      TAB      Ectopic      Multiple      Live Births           Obstetric Comments  Menarche  age 25, 2 c-sections, menopause in 12's         Home Medications    Prior to Admission medications   Medication Sig Start Date End Date Taking? Authorizing Provider  aspirin EC 81 MG tablet Take 81 mg by mouth daily.   Yes [provider]  atorvastatin (LIPITOR) 20 MG tablet Take 20 mg by mouth daily.  02/12/14  Yes [provider]  carvedilol (COREG) 6.25 MG tablet TAKE 1 TABLET(6.25 MG) BY MOUTH TWICE DAILY WITH A MEAL Patient taking differently: Take 6.25 mg by mouth 2 (two) times daily with a meal.  09/20/18  Yes Lorretta Harp, MD  lisinopril (PRINIVIL,ZESTRIL) 5 MG tablet TAKE 1 TABLET BY MOUTH ONCE DAILY 03/07/18  Yes Lorretta Harp, MD  sertraline (ZOLOFT) 100 MG tablet Take 100 mg by mouth 2 (two) times a day.    Yes [provider]   vitamin B-12 (CYANOCOBALAMIN) 500 MCG tablet Take 500 mcg by mouth daily.   Yes [provider]    Family History Family History  Problem Relation Age of Onset  . Heart disease Mother   . Cancer Father   . Heart disease Maternal Grandmother   . Heart disease Maternal Grandfather   . Cancer Paternal Grandmother   . Lung disease Neg Hx     Social History Social History   Tobacco Use  . Smoking status: Former Smoker    Packs/day: 3.00    Years: 30.00    Pack years: 90.00    Types: Cigarettes    Start date: 05/19/1958    Quit date: 06/30/1990    Years since quitting: 28.4  . Smokeless tobacco: Never Used  . Tobacco comment: Stopped smoking for 2-3 years during second child.   Substance Use Topics  . Alcohol use: Yes    Alcohol/week: 1.0 standard drinks    Types: 1 Glasses of wine per week    Comment: 10/17/2012 "3-4 glasses of wine/month"  . Drug use: No     Allergies   Patient has no known allergies.   Review of Systems Review of Systems  Respiratory: Positive for cough.   Cardiovascular: Positive for chest pain.   All other systems reviewed and are negative except that which was mentioned in HPI   Physical Exam Updated Vital Signs BP (!) 161/58   Pulse 66   Temp 98.6 F (37 C) (Oral)   Resp 18   Ht 5' 4" (1.626 m)   Wt 54.4 kg   SpO2 94%   BMI 20.60 kg/m   Physical Exam Vitals signs and nursing note reviewed.  Constitutional:      General: She is not in acute distress.    Appearance: She is well-developed.  HENT:     Head: Normocephalic and atraumatic.  Eyes:     Conjunctiva/sclera: Conjunctivae normal.     Pupils: Pupils are equal, round, and reactive to light.  Neck:     Musculoskeletal: Neck supple.  Cardiovascular:     Rate and Rhythm: Normal rate and regular rhythm.     Heart sounds: Murmur present. Systolic murmur present with a grade of 4/6.  Pulmonary:     Effort: Pulmonary effort is normal.     Breath sounds: Normal breath  sounds.  Abdominal:     General: Bowel sounds are normal. There is no distension.     Palpations: Abdomen is soft.     Tenderness: There is no abdominal tenderness.  Musculoskeletal:     Right  lower leg: No edema.     Left lower leg: No edema.  Skin:    General: Skin is warm and dry.  Neurological:     Mental Status: She is alert and oriented to person, place, and time.     Comments: Fluent speech  Psychiatric:        Judgment: Judgment normal.      ED Treatments / Results  Labs (all labs ordered are listed, but only abnormal results are displayed) Labs Reviewed  BASIC METABOLIC PANEL - Abnormal; Notable for the following components:      Result Value   Glucose, Bld 110 (*)    Creatinine, Ser 1.01 (*)    GFR calc non Af Amer 54 (*)    All other components within normal limits  BRAIN NATRIURETIC PEPTIDE - Abnormal; Notable for the following components:   B Natriuretic Peptide 321.3 (*)    All other components within normal limits  HEPATIC FUNCTION PANEL - Abnormal; Notable for the following components:   Alkaline Phosphatase 35 (*)    All other components within normal limits  SARS CORONAVIRUS 2 (HOSPITAL ORDER, [facility])  CBC  TROPONIN I (HIGH SENSITIVITY)  TROPONIN I (HIGH SENSITIVITY)  D-DIMER, QUANTITATIVE (NOT AT [facility])    EKG EKG Interpretation  Date/Time:  Friday December 16 2018 09:32:18 EDT Ventricular Rate:  70 PR Interval:    QRS Duration: 87 QT Interval:  391 QTC Calculation: 422 R Axis:   36 Text Interpretation:  Sinus rhythm LVH with secondary repolarization abnormality Probable anterior infarct, age indeterminate No previous ECGs available Confirmed by Theotis Burrow [phone]) on 12/16/2018 9:54:15 AM   Radiology Dg Chest 2 View  Result Date: 12/16/2018 CLINICAL DATA:  Chest tightness. History of breast carcinoma. Decreased oxygen saturation EXAM: CHEST - 2 VIEW COMPARISON:  December 29, 2016 FINDINGS: There is no edema or  consolidation. The heart size and pulmonary vascularity are normal. No adenopathy. There is aortic atherosclerosis. No bone lesions. IMPRESSION: No edema or consolidation. Stable cardiac silhouette. Aortic Atherosclerosis (ICD10-I70.0). Electronically Signed   By: Lowella Grip III M.D.   On: 12/16/2018 09:59    Procedures Procedures (including critical care time)  Medications Ordered in ED Medications  sodium chloride flush (NS) 0.9 % injection 3 mL (3 mLs Intravenous Given 12/16/18 1001)     Initial Impression / Assessment and Plan / ED Course  I have reviewed the triage vital signs and the nursing notes.  Pertinent labs & imaging results that were available during my care of the patient were reviewed by me and considered in my medical decision making (see chart for details).       Comfortable, O2 sat mid 90s, normal WOB. EKG without ischemic changes. CXR without acute ischemic changes.  Lab work shows negative troponin and d-dimer, normal creatinine, BNP 321.  Given the exertional nature of her symptoms, it is possible that her aortic insufficiency has reached a critical points or that she is having a mild heart failure exacerbation.  I am also concerned about the possibility of unstable angina given advanced age and risk factors.  I discussed with cardiology, Suanne Marker, and they will see the patient in consultation in the morning.  Discussed admission with hospitalist, Dr. Doristine Bosworth, and pt admitted for further w/u.   Final Clinical Impressions(s) / ED Diagnoses   Final diagnoses:  None    ED Discharge Orders    None       Ercie Eliasen, Wenda Overland, MD  12/16/18 1525

## 2018-12-16 NOTE — ED Notes (Signed)
ED TO INPATIENT HANDOFF REPORT  Name/Age/Gender Dana Pruitt 78 y.o. female  Code Status Code Status History    Date Active Date Inactive Code Status Order ID Comments User Context   10/17/2012 1350 10/20/2012 1738 Full Code [id number]  Mendel Corning, MD Inpatient   Advance Care Planning Activity    Advance Directive Documentation     Most Recent Value  Type of Advance Directive  Healthcare Power of Attorney, Living will  Pre-existing out of facility DNR order (yellow form or pink MOST form)  -  "MOST" Form in Place?  -      Home/SNF/Other Home  Chief Complaint Chest tightness, SOB, oxygen level between 87 and 92  Level of Care/Admitting Diagnosis ED Disposition    ED Disposition Condition [place]: [facility] [place] [[id number]]  Level of Care: Med-Surg [16]  Covid Evaluation: N/A  Diagnosis: Sepsis secondary to UTI [facility]) [[id number]]  Admitting Physician: Darliss Cheney [[id number]]  Attending Physician: Darliss Cheney [[id number]]  Estimated length of stay: 3 - 4 days  Certification:: I certify this patient will need inpatient services for at least 2 midnights  PT Class (Do Not Modify): Inpatient [101]  PT Acc Code (Do Not Modify): Private [1]       Medical History Past Medical History:  Diagnosis Date  . Anemia    "several times over the years" (10/17/2012)  . Anxiety   . Breast cancer ([place]) 02/2010   right breast  . Carcinoma of breast treated with adjuvant hormone therapy ([place]) 9/11   Femara  . CHF (congestive heart failure) ([place])   . COPD (chronic obstructive pulmonary disease) ([place])   . Exertional shortness of breath    "recently" (10/17/2012)  . Hollenhorst plaque, left eye    apparently resolved at recent ophthalmology visit  . Hyperlipidemia   . 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 Left ventricular dysfunction   . Moderate aortic insufficiency   . Pneumonia    "that's why I'm here" (10/17/2012)    Allergies No  Known Allergies  IV Location/Drains/Wounds Patient Lines/Drains/Airways Status   Active Line/Drains/Airways    Name:   Placement date:   Placement time:   Site:   Days:   Peripheral IV 12/16/18 Left Forearm   12/16/18    1000    Forearm   less than 1   Incision 07/03/11 Breast Right   07/03/11    1432     2723   Incision 07/03/11 Axilla Right   07/03/11    1432     2723          Labs/Imaging Results for orders placed or performed during the hospital encounter of 12/16/18 (from the past 48 hour(s))  Basic metabolic panel     Status: Abnormal   Collection Time: 12/16/18  9:34 AM  Result Value Ref Range   Sodium 139 135 - 145 mmol/L   Potassium 4.3 3.5 - 5.1 mmol/L   Chloride 107 98 - 111 mmol/L   CO2 24 22 - 32 mmol/L   Glucose, Bld 110 (H) 70 - 99 mg/dL   BUN 17 8 - 23 mg/dL   Creatinine, Ser 1.01 (H) 0.44 - 1.00 mg/dL   Calcium 9.7 8.9 - 10.3 mg/dL   GFR calc non Af Amer 54 (L) >60 mL/min   GFR calc Af Amer >60 >60 mL/min   Anion gap 8 5 - 15    Comment: Performed at [facility],  [place] [address], [place], [place] [address]  CBC     Status: None   Collection Time: 12/16/18  9:34 AM  Result Value Ref Range   WBC 5.4 4.0 - 10.5 K/uL   RBC 4.14 3.87 - 5.11 MIL/uL   Hemoglobin 12.8 12.0 - 15.0 g/dL   HCT 39.9 36.0 - 46.0 %   MCV 96.4 80.0 - 100.0 fL   MCH 30.9 26.0 - 34.0 pg   MCHC 32.1 30.0 - 36.0 g/dL   RDW 13.4 11.5 - 15.5 %   Platelets 212 150 - 400 K/uL   nRBC 0.0 0.0 - 0.2 %    Comment: Performed at [facility], [place] [address] [place], [place] [address]  Troponin I (High Sensitivity)     Status: None   Collection Time: 12/16/18  9:34 AM  Result Value Ref Range   Troponin I (High Sensitivity) 10 <18 ng/L    Comment: (NOTE) Elevated high sensitivity troponin I (hsTnI) values and significant  changes across serial measurements may suggest ACS but many other  chronic and acute conditions are known to elevate hsTnI results.   Refer to the Links section for chest pain algorithms and additional  guidance. Performed at [facility], [place] [address] [place], [place] [address]   Brain natriuretic peptide     Status: Abnormal   Collection Time: 12/16/18  9:34 AM  Result Value Ref Range   B Natriuretic Peptide 321.3 (H) 0.0 - 100.0 pg/mL    Comment: Performed at [facility], [place] [address], [place], [place] [address]  Hepatic function panel     Status: Abnormal   Collection Time: 12/16/18  9:34 AM  Result Value Ref Range   Total Protein 7.0 6.5 - 8.1 g/dL   Albumin 4.0 3.5 - 5.0 g/dL   AST 17 15 - 41 U/L   ALT 15 0 - 44 U/L   Alkaline Phosphatase 35 (L) 38 - 126 U/L   Total Bilirubin 0.7 0.3 - 1.2 mg/dL   Bilirubin, Direct 0.1 0.0 - 0.2 mg/dL   Indirect Bilirubin 0.6 0.3 - 0.9 mg/dL    Comment: Performed at [facility], [place] [address] [place], [place] [address]  Troponin I (High Sensitivity)     Status: None   Collection Time: 12/16/18  1:46 PM  Result Value Ref Range   Troponin I (High Sensitivity) 8.0 <18 ng/L    Comment: (NOTE) Elevated high sensitivity troponin I (hsTnI) values and significant  changes across serial measurements may suggest ACS but many other  chronic and acute conditions are known to elevate hsTnI results.  Refer to the "Links" section for chest pain algorithms and additional  guidance. Performed at [facility], [place] [address], [place], [place] [address]   D-dimer, quantitative     Status: None   Collection Time: 12/16/18  1:46 PM  Result Value Ref Range   D-Dimer, Quant 0.38 0.00 - 0.50 ug/mL-FEU    Comment: (NOTE) At the manufacturer cut-off of 0.50 ug/mL FEU, this assay has been documented to exclude PE with a sensitivity and negative predictive value of 97 to 99%.  At this time, this assay has not been approved by the FDA to exclude DVT/VTE. Results should be correlated with clinical  presentation. Performed at [facility], [place] [address] [place], [place] [address]    Dg Chest 2 View  Result Date: 12/16/2018 CLINICAL DATA:  Chest tightness. History of breast  carcinoma. Decreased oxygen saturation EXAM: CHEST - 2 VIEW COMPARISON:  December 29, 2016 FINDINGS: There is no edema or consolidation. The heart size and pulmonary vascularity are normal. No adenopathy. There is aortic atherosclerosis. No bone lesions. IMPRESSION: No edema or consolidation. Stable cardiac silhouette. Aortic Atherosclerosis (ICD10-I70.0). Electronically Signed   By: Lowella Grip III M.D.   On: 12/16/2018 09:59    Pending Labs Unresulted Labs (From admission, onward)    Start     Ordered   12/16/18 1459  SARS Coronavirus 2 (CEPHEID- Performed in [facility] hospital lab), Hosp Order  (Symptomatic Patients Labs with Precautions )  Once,   STAT    Question:  Patient immune status  Answer:  Normal   12/16/18 1458   Signed and Held  CBC  (enoxaparin (LOVENOX)    CrCl >/= 30 ml/min)  Once,   R    Comments: Baseline for enoxaparin therapy IF NOT ALREADY DRAWN.  Notify MD if PLT < 100 K.    Signed and Held   Signed and Held  Creatinine, serum  (enoxaparin (LOVENOX)    CrCl >/= 30 ml/min)  Once,   R    Comments: Baseline for enoxaparin therapy IF NOT ALREADY DRAWN.    Signed and Held   Signed and Held  Creatinine, serum  (enoxaparin (LOVENOX)    CrCl >/= 30 ml/min)  Weekly,   R    Comments: while on enoxaparin therapy    Signed and Held   Signed and Held  Magnesium  Once,   R     Signed and Held   Signed and Held  Troponin I (High Sensitivity)  STAT Now then every 2 hours,   STAT     Signed and Held   Signed and Held  Basic metabolic panel  Tomorrow morning,   R     Signed and Held   Signed and Held  Lipid panel  Tomorrow morning,   R     Signed and Held   Signed and Held  CBC  Tomorrow morning,   R     Signed and Held          Vitals/Pain Today's Vitals   12/16/18  1230 12/16/18 1300 12/16/18 1330 12/16/18 1439  BP: (!) 159/67 (!) 164/59 (!) 161/58 (!) 161/58  Pulse: 65 63 68 66  Resp: 16 17 19 18   Temp:      TempSrc:      SpO2: (!) 89% 90% (!) 87% 94%  Weight:      Height:      PainSc:        Isolation Precautions Airborne and Contact precautions  Medications Medications  sodium chloride flush (NS) 0.9 % injection 3 mL (3 mLs Intravenous Given 12/16/18 1001)    Mobility walks

## 2018-12-16 NOTE — Plan of Care (Signed)

## 2018-12-17 ENCOUNTER — Encounter (HOSPITAL_COMMUNITY): Payer: Self-pay

## 2018-12-17 ENCOUNTER — Inpatient Hospital Stay (HOSPITAL_COMMUNITY)
Admit: 2018-12-17 | Discharge: 2018-12-17 | Disposition: A | Discharge status: Home / Self Care | Payer: Medicare Other | Attending: Cardiology | Admitting: Cardiology

## 2018-12-17 DIAGNOSIS — I351 Nonrheumatic aortic (valve) insufficiency: Secondary | ICD-10-CM

## 2018-12-17 DIAGNOSIS — I5022 Chronic systolic (congestive) heart failure: Secondary | ICD-10-CM

## 2018-12-17 DIAGNOSIS — I272 Pulmonary hypertension, unspecified: Secondary | ICD-10-CM

## 2018-12-17 DIAGNOSIS — R0789 Other chest pain: Secondary | ICD-10-CM

## 2018-12-17 DIAGNOSIS — J438 Other emphysema: Secondary | ICD-10-CM

## 2018-12-17 DIAGNOSIS — I160 Hypertensive urgency: Principal | ICD-10-CM

## 2018-12-17 DIAGNOSIS — I5043 Acute on chronic combined systolic (congestive) and diastolic (congestive) heart failure: Secondary | ICD-10-CM

## 2018-12-17 DIAGNOSIS — E782 Mixed hyperlipidemia: Secondary | ICD-10-CM

## 2018-12-17 LAB — CBC
HCT: 38.9 % (ref 36.0–46.0)
Hemoglobin: 12.3 g/dL (ref 12.0–15.0)
MCH: 31.2 pg (ref 26.0–34.0)
MCHC: 31.6 g/dL (ref 30.0–36.0)
MCV: 98.7 fL (ref 80.0–100.0)
Platelets: 211 10*3/uL (ref 150–400)
RBC: 3.94 MIL/uL (ref 3.87–5.11)
RDW: 13.2 % (ref 11.5–15.5)
WBC: 5.3 10*3/uL (ref 4.0–10.5)
nRBC: 0 % (ref 0.0–0.2)

## 2018-12-17 LAB — LIPID PANEL
Cholesterol: 140 mg/dL (ref 0–200)
HDL: 38 mg/dL — ABNORMAL LOW (ref >40)
LDL Cholesterol: 81 mg/dL (ref 0–99)
Total CHOL/HDL Ratio: 3.7 RATIO
Triglycerides: 103 mg/dL (ref <150)
VLDL: 21 mg/dL (ref 0–40)

## 2018-12-17 LAB — BASIC METABOLIC PANEL
Anion gap: 8 (ref 5–15)
BUN: 19 mg/dL (ref 8–23)
CO2: 25 mmol/L (ref 22–32)
Calcium: 9.6 mg/dL (ref 8.9–10.3)
Chloride: 107 mmol/L (ref 98–111)
Creatinine, Ser: 1.07 mg/dL — ABNORMAL HIGH (ref 0.44–1.00)
GFR calc Af Amer: 58 mL/min — ABNORMAL LOW (ref >60)
GFR calc non Af Amer: 50 mL/min — ABNORMAL LOW (ref >60)
Glucose, Bld: 102 mg/dL — ABNORMAL HIGH (ref 70–99)
Potassium: 4.1 mmol/L (ref 3.5–5.1)
Sodium: 140 mmol/L (ref 135–145)

## 2018-12-17 MED ORDER — LISINOPRIL 20 MG PO TABS
20.0000 mg | ORAL_TABLET | Freq: Every day | ORAL | Status: DC
  Administered 2018-12-18: 20 mg via ORAL
  Filled 2018-12-17: qty 1

## 2018-12-17 MED ORDER — NITROGLYCERIN 0.4 MG SL SUBL
SUBLINGUAL_TABLET | SUBLINGUAL | Status: AC
  Filled 2018-12-17: qty 2

## 2018-12-17 MED ORDER — IOHEXOL 350 MG/ML SOLN
80.0000 mL | Freq: Once | INTRAVENOUS | Status: AC | PRN
  Administered 2018-12-17: 80 mL via INTRAVENOUS

## 2018-12-17 MED ORDER — METOPROLOL TARTRATE 5 MG/5ML IV SOLN
INTRAVENOUS | Status: AC
  Filled 2018-12-17: qty 10

## 2018-12-17 MED ORDER — METOPROLOL TARTRATE 25 MG PO TABS
25.0000 mg | ORAL_TABLET | Freq: Once | ORAL | Status: AC
  Administered 2018-12-17: 25 mg via ORAL
  Filled 2018-12-17: qty 1

## 2018-12-17 NOTE — Progress Notes (Addendum)
PROGRESS NOTE  Dana Pruitt UMP:[id number] DOB: 13-Aug-1940 DOA: 12/16/2018 PCP: Deland Pretty, MD  HPI/Recap of past 24 hours:  Continue to have chest tightness, denies chest pain, no dizziness, no edema, no cough, no fever  Assessment/Plan: Active Problems:   HLD (hyperlipidemia)   Severe aortic regurgitation   Chest pain at rest   Chronic systolic CHF (congestive heart failure) (HCC)   Hypertensive urgency   Sepsis secondary to UTI ([place])  Acute hypoxic respiratory failure -Reports DOE , hypoxic , chest tightness for the last few days -High sensitivity troponin unremarkable, cxr no acute findings, no cough, no fever, tele unremarkable -H/o arotic insufficiency -H/o pulmonary hypertension. -recent echo with reduced EF -cardiology consulted, further management per cardiology  Chronic systolic chf euvolemic , on coreg, lisinopril   HTN:  Coreg, lisinopril  COPD, moderate  Reports a heavy smoker before but quit 57yrs ago No wheezing, lung clear  SARS cov 2 screening negative   Code Status: full  Family Communication: patient   Disposition Plan: not ready to discharge today, need to wean o2, needs ambulate o2 sats, needs cardiology clearance    Consultants:  cardiology  Procedures:  none  Antibiotics:  none   Objective: BP (!) 151/55 (BP Location: Right Arm)   Pulse 60   Temp (!) 97.4 F (36.3 C) (Oral)   Resp 16   Ht 5\' 4"  (1.626 m)   Wt 56.7 kg   SpO2 92%   BMI 21.46 kg/m   Intake/Output Summary (Last 24 hours) at 12/17/2018 1053 Last data filed at 12/17/2018 0600 Gross per 24 hour  Intake 250 ml  Output -  Net 250 ml   Filed Weights   12/16/18 0929 12/17/18 0443  Weight: 54.4 kg 56.7 kg    Exam: Patient is examined daily including today on 12/17/2018, exams remain the same as of yesterday except that has changed    General:  NAD  Cardiovascular: RRR  Respiratory: CTABL  Abdomen: Soft/ND/NT, positive BS  Musculoskeletal: No  Edema  Neuro: alert, oriented   Data Reviewed: Basic Metabolic Panel: Recent Labs  Lab 12/16/18 0934 12/16/18 1650 12/17/18 0430  NA 139  --  140  K 4.3  --  4.1  CL 107  --  107  CO2 24  --  25  GLUCOSE 110*  --  102*  BUN 17  --  19  CREATININE 1.01* 0.98 1.07*  CALCIUM 9.7  --  9.6  MG  --  2.0  --    Liver Function Tests: Recent Labs  Lab 12/16/18 0934  AST 17  ALT 15  ALKPHOS 35*  BILITOT 0.7  PROT 7.0  ALBUMIN 4.0   No results for input(s): LIPASE, AMYLASE in the last 168 hours. No results for input(s): AMMONIA in the last 168 hours. CBC: Recent Labs  Lab 12/16/18 0934 12/16/18 1650 12/17/18 0430  WBC 5.4 5.4 5.3  HGB 12.8 12.4 12.3  HCT 39.9 37.9 38.9  MCV 96.4 96.4 98.7  PLT 212 207 211   Cardiac Enzymes:   No results for input(s): CKTOTAL, CKMB, CKMBINDEX, TROPONINI in the last 168 hours. BNP (last 3 results) Recent Labs    12/16/18 0934  BNP 321.3*    ProBNP (last 3 results) No results for input(s): PROBNP in the last 8760 hours.  CBG: No results for input(s): GLUCAP in the last 168 hours.  Recent Results (from the past 240 hour(s))  SARS Coronavirus 2 (CEPHEID- Performed in [facility] hospital lab),  Hosp Order     Status: None   Collection Time: 12/16/18  2:50 PM   Specimen: Nasopharyngeal Swab  Result Value Ref Range Status   SARS Coronavirus 2 NEGATIVE NEGATIVE Final    Comment: (NOTE) If result is NEGATIVE SARS-CoV-2 target nucleic acids are NOT DETECTED. The SARS-CoV-2 RNA is generally detectable in upper and lower  respiratory specimens during the acute phase of infection. The lowest  concentration of SARS-CoV-2 viral copies this assay can detect is 250  copies / mL. A negative result does not preclude SARS-CoV-2 infection  and should not be used as the sole basis for treatment or other  patient management decisions.  A negative result may occur with  improper specimen collection / handling, submission of specimen other   than nasopharyngeal swab, presence of viral mutation(s) within the  areas targeted by this assay, and inadequate number of viral copies  (<250 copies / mL). A negative result must be combined with clinical  observations, patient history, and epidemiological information. If result is POSITIVE SARS-CoV-2 target nucleic acids are DETECTED. The SARS-CoV-2 RNA is generally detectable in upper and lower  respiratory specimens dur ing the acute phase of infection.  Positive  results are indicative of active infection with SARS-CoV-2.  Clinical  correlation with patient history and other diagnostic information is  necessary to determine patient infection status.  Positive results do  not rule out bacterial infection or co-infection with other viruses. If result is PRESUMPTIVE POSTIVE SARS-CoV-2 nucleic acids MAY BE PRESENT.   A presumptive positive result was obtained on the submitted specimen  and confirmed on repeat testing.  While 2019 novel coronavirus  (SARS-CoV-2) nucleic acids may be present in the submitted sample  additional confirmatory testing may be necessary for epidemiological  and / or clinical management purposes  to differentiate between  SARS-CoV-2 and other Sarbecovirus currently known to infect humans.  If clinically indicated additional testing with an alternate test  methodology [phone]) is advised. The SARS-CoV-2 RNA is generally  detectable in upper and lower respiratory sp ecimens during the acute  phase of infection. The expected result is Negative. Fact Sheet for Patients:  StrictlyIdeas.no Fact Sheet for Healthcare Providers: BankingDealers.co.za This test is not yet approved or cleared by the [place] FDA and has been authorized for detection and/or diagnosis of SARS-CoV-2 by FDA under an Emergency Use Authorization (EUA).  This EUA will remain in effect (meaning this test can be used) for the duration of the  COVID-19 declaration under Section 564(b)(1) of the Act, 21 U.S.C. section 360bbb-3(b)(1), unless the authorization is terminated or revoked sooner. Performed at [facility], [place] [address], [place], [place] [address]      Studies: No results found.  Scheduled Meds: . aspirin EC  81 mg Oral Daily  . atorvastatin  20 mg Oral Daily  . carvedilol  6.25 mg Oral BID WC  . enoxaparin (LOVENOX) injection  40 mg Subcutaneous Q24H  . lisinopril  5 mg Oral Daily  . metoprolol tartrate  25 mg Oral Once  . sertraline  100 mg Oral BID    Continuous Infusions:   Time spent: 35 mins, case discussed with cardiology  I have personally reviewed and interpreted on  12/17/2018 daily labs,  Tele strips, imagings as discussed above under date review session and assessment and plans.  I reviewed all nursing notes, pharmacy notes, consultant notes,  vitals, pertinent old records  I have discussed plan of care as described above with  RN , patient  on 12/17/2018   Florencia Reasons MD, PhD  [facility] Pager [phone]. If 7PM-7AM, please contact night-coverage at [email or web address], password [facility] 12/17/2018, 10:53 AM  LOS: 1 day

## 2018-12-17 NOTE — Consult Note (Signed)
Cardiology Consultation:   Patient ID: Dana Pruitt MRN: [id number]; DOB: 1941/03/10  Admit date: 12/16/2018 Date of Consult: 12/17/2018  Primary Care Provider: Deland Pretty, MD Primary Cardiologist: Quay Burow, MD   Patient Profile:   Dana Pruitt is a 78 y.o. female with a hx of chronic systolic CHF who is being seen today for the evaluation of chest pain at the request of Darliss Cheney.  History of Present Illness:   Dana Pruitt is a 78y.o.femalethin-appearing married Caucasian female who has been followed by Dr [name] for chronic systolic CHF (LVEF 58-52%), hypertension, hyperlipidemia, COPD, severe aortic insufficiency with normal LV size and function. She did have breast cancer documented by lumpectomy in 2013 and had chemotherapy and radiation therapy. Her oncologist was Dr. Letta Pate . She is on Femara.  She was last seen by Dr [name] in April 2020 when she remained asymptomatic. She denied chest pain or shortness of breath.   She presented to the ER on 12/16/2018 with complaint of chest pain.  According to patient, she started chest pain about 3 days ago.  She describes pain as pressure, 2-3 out of 10, nonradiating with no aggravating or relieving factor and has been intermittent.  She has some shortness of breath, nausea and some chronic cough due to GERD.  She tells me that she has been taking her oxygen saturation at home and she has been ranging anywhere from 87% to 93% for past 3 days.  She denies any fever, dizziness, diaphoresis, any sick contact or any recent travel. In the ED she had significantly elevated blood pressure. The patient admits to worsening exercise tolerance while walking stairs, uphill and chest tightness in the lest few days.   Labs: BNP 321, Crea 1.07, Na 140, K 4.1, Hb 12.3, Troponin negative x 3  Heart Pathway Score:     Past Medical History:  Diagnosis Date  . Anemia    "several times over the years" (10/17/2012)  . Anxiety   . Breast cancer  ([place]) 02/2010   right breast  . Carcinoma of breast treated with adjuvant hormone therapy ([place]) 9/11   Femara  . CHF (congestive heart failure) ([place])   . COPD (chronic obstructive pulmonary disease) ([place])   . Exertional shortness of breath    "recently" (10/17/2012)  . Hollenhorst plaque, left eye    apparently resolved at recent ophthalmology visit  . Hyperlipidemia   . 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 Left ventricular dysfunction   . Moderate aortic insufficiency   . Pneumonia    "that's why I'm here" (10/17/2012)    Past Surgical History:  Procedure Laterality Date  . APPENDECTOMY  1974  . BREAST BIOPSY Right 02/27/10   Needle core Biopsy; Invasive Mammary; ER/PR Positive, Her-2 Neu negative, Ki-67 22%  . BREAST LUMPECTOMY WITH SENTINEL LYMPH NODE BIOPSY Right 07/03/11   Invasive Ductal Carcinoma;0/3 nodes negative,; ER,PR Positive, Her-2 Neg; Ki-67 22%  . [place]; 1974  . COLONOSCOPY    . DILATION AND CURETTAGE OF UTERUS  1970's  . TUBAL LIGATION  1974    Inpatient Medications: Scheduled Meds: . aspirin EC  81 mg Oral Daily  . atorvastatin  20 mg Oral Daily  . carvedilol  6.25 mg Oral BID WC  . enoxaparin (LOVENOX) injection  40 mg Subcutaneous Q24H  . lisinopril  5 mg Oral Daily  . sertraline  100 mg Oral BID   Continuous Infusions:  PRN Meds: acetaminophen, hydrALAZINE, nitroGLYCERIN, ondansetron (ZOFRAN) IV  Allergies:   No Known Allergies  Social History:   Social History   Socioeconomic History  . Marital status: Married    Spouse name: Not on file  . Number of children: 2  . Years of education: college  . Highest education level: Not on file  Occupational History    Employer: RETIRED  Social Needs  . Financial resource strain: Not on file  . Food insecurity    Worry: Not on file    Inability: Not on file  . Transportation needs    Medical: Not on file    Non-medical: Not on file  Tobacco Use  . Smoking status:  Former Smoker    Packs/day: 3.00    Years: 30.00    Pack years: 90.00    Types: Cigarettes    Start date: 05/19/1958    Quit date: 06/30/1990    Years since quitting: 28.4  . Smokeless tobacco: Never Used  . Tobacco comment: Stopped smoking for 2-3 years during second child.   Substance and Sexual Activity  . Alcohol use: Yes    Alcohol/week: 1.0 standard drinks    Types: 1 Glasses of wine per week    Comment: 10/17/2012 "3-4 glasses of wine/month"  . Drug use: No  . Sexual activity: Yes    Birth control/protection: Post-menopausal    Comment: Menarche age 46, G70,P2, Menopause in 90"s, No HRT Married  Lifestyle  . Physical activity    Days per week: Not on file    Minutes per session: Not on file  . Stress: Not on file  Relationships  . Social Herbalist on phone: Not on file    Gets together: Not on file    Attends religious service: Not on file    Active member of club or organization: Not on file    Attends meetings of clubs or organizations: Not on file    Relationship status: Not on file  . Intimate partner violence    Fear of current or ex partner: Not on file    Emotionally abused: Not on file    Physically abused: Not on file    Forced sexual activity: Not on file  Other Topics Concern  . Not on file  Social History Narrative   [facility] (12/29/16):   Originally from [place], [place]. Moved to [place] at 78 y.o. Does have a dog. Hasn't worked outside the home. Remote parakeet exposure from her children. No mold or hot tub exposure. Enjoys doing yard work & walking.     Family History:    Family History  Problem Relation Age of Onset  . Heart disease Mother   . Cancer Father   . Heart disease Maternal Grandmother   . Heart disease Maternal Grandfather   . Cancer Paternal Grandmother   . Lung disease Neg Hx     ROS:  Please see the history of present illness.  All other ROS reviewed and negative.     Physical Exam/Data:   Vitals:   12/16/18  1612 12/16/18 1632 12/16/18 2137 12/17/18 0443  BP: (!) 148/68 (!) 185/73 (!) 134/58 (!) 151/55  Pulse: 65 64 67 60  Resp: _0 Temp:  98 F (36.7 C) 98.5 F (36.9 C) (!) 97.4 F (36.3 C)  TempSrc:  Oral Oral Oral  SpO2: 90% 96% 90% 92%  Weight:    56.7 kg  Height:        Intake/Output Summary (Last 24 hours) at 12/17/2018  [place] filed at 12/17/2018 0600 Gross per 24 hour  Intake 250 ml  Output -  Net 250 ml   Last 3 Weights 12/17/2018 12/16/2018 10/11/2018  Weight (lbs) 125 lb 120 lb 120 lb  Weight (kg) 56.7 kg 54.432 kg 54.432 kg     Body mass index is 21.46 kg/m.  General:  Well nourished, well developed, in no acute distress HEENT: normal Lymph: no adenopathy Neck: no JVD Endocrine:  No thryomegaly Vascular: No carotid bruits; FA pulses 2+ bilaterally without bruits  Cardiac:  normal S1, S2; RRR; 4/6 holosystolic murmur Lungs:  clear to auscultation bilaterally, no wheezing, rhonchi or rales  Abd: soft, nontender, no hepatomegaly  Ext: no edema Musculoskeletal:  No deformities, BUE and BLE strength normal and equal Skin: warm and dry  Neuro:  CNs 2-12 intact, no focal abnormalities noted Psych:  Normal affect   EKG:  The EKG was personally reviewed and demonstrates:  SR, LVH, non-specific ST T wave abnormalities, no changed since the prior ECG on 03/09/2017 Telemetry:  Telemetry was personally reviewed and demonstrates:  SR  Relevant CV Studies:  TTE: 10/04/2018   1. The left ventricle has mild-moderately reduced systolic function, with an ejection fraction of 40-45%. The cavity size was normal. Left ventricular diastolic Doppler parameters are consistent with impaired relaxation. Left ventricular diffuse  hypokinesis.  2. The right ventricle has normal systolic function. The cavity was normal.  3. The mitral valve is abnormal. Mild thickening of the mitral valve leaflet.  4. The tricuspid valve is grossly normal.  5. The aortic valve is tricuspid.  Mild thickening of the aortic valve. Aortic valve regurgitation is severe by color flow Doppler. No stenosis of the aortic valve.  6. Mild to moderate global reduction in LV systolic function; mild diastolic dysfunction; severe AI; mild MR.  Laboratory Data:  High Sensitivity Troponin:   Recent Labs  Lab 12/16/18 0934 12/16/18 1346 12/16/18 1650 12/16/18 1816  TROPONINIHS 10 8.0 10 10.0     Cardiac EnzymesNo results for input(s): TROPONINI in the last 168 hours. No results for input(s): TROPIPOC in the last 168 hours.  Chemistry Recent Labs  Lab 12/16/18 0934 12/16/18 1650 12/17/18 0430  NA 139  --  140  K 4.3  --  4.1  CL 107  --  107  CO2 24  --  25  GLUCOSE 110*  --  102*  BUN 17  --  19  CREATININE 1.01* 0.98 1.07*  CALCIUM 9.7  --  9.6  GFRNONAA 54* 56* 50*  GFRAA >60 >60 58*  ANIONGAP 8  --  8    Recent Labs  Lab 12/16/18 0934  PROT 7.0  ALBUMIN 4.0  AST 17  ALT 15  ALKPHOS 35*  BILITOT 0.7   Hematology Recent Labs  Lab 12/16/18 0934 12/16/18 1650 12/17/18 0430  WBC 5.4 5.4 5.3  RBC 4.14 3.93 3.94  HGB 12.8 12.4 12.3  HCT 39.9 37.9 38.9  MCV 96.4 96.4 98.7  MCH 30.9 31.6 31.2  MCHC 32.1 32.7 31.6  RDW 13.4 13.2 13.2  PLT 212 207 211   BNP Recent Labs  Lab 12/16/18 0934  BNP 321.3*    DDimer  Recent Labs  Lab 12/16/18 1346  DDIMER 0.38   Radiology/Studies:  Dg Chest 2 View  Result Date: 12/16/2018 CLINICAL DATA:  Chest tightness. History of breast carcinoma. Decreased oxygen saturation EXAM: CHEST - 2 VIEW COMPARISON:  December 29, 2016 FINDINGS: There is no edema or  consolidation. The heart size and pulmonary vascularity are normal. No adenopathy. There is aortic atherosclerosis. No bone lesions. IMPRESSION: No edema or consolidation. Stable cardiac silhouette. Aortic Atherosclerosis (ICD10-I70.0). Electronically Signed   By: Lowella Grip III M.D.   On: 12/16/2018 09:59    Assessment and Plan:   1. Chest tightness, ECG unchanged,  troponin negative x 3, I will order coronary CTA to evaluate coronaries  That she will also need for the purposes of coronary evaluation prior to AVR. We will increase FOV to include the entire aortic arch.  2. Severe aortic insufficiency with LVEF 40-45%, normal LV size, mild to moderate pulmonary hypertension RVSP 39 mmHg.  3. Hypertensive urgency - increase lisinopril to 20 mg po daily 4. Nonischemic cardiomyopathy- recent 2D echo performed 10/04/2018 revealed a slight decline in her ejection fraction of the last year and a half.  It was 50 to 55% on her echo 07/19/2016.  It has declined to 40 to 45% on 10/04/2018.  She needs AVR rather sooner than later, we will consult surgery for consultation. Increase lisinopril, continue carvedilol.   5. Hyperlipidemia- history of hyperlipidemia on statin therapy.  We will follow.  For questions or updates, please contact [place] Please consult [email or web address] for contact info under   Signed, Ena Dawley, MD  12/17/2018 10:05 AM

## 2018-12-18 DIAGNOSIS — I351 Nonrheumatic aortic (valve) insufficiency: Secondary | ICD-10-CM

## 2018-12-18 DIAGNOSIS — J9601 Acute respiratory failure with hypoxia: Secondary | ICD-10-CM

## 2018-12-18 DIAGNOSIS — I272 Pulmonary hypertension, unspecified: Secondary | ICD-10-CM

## 2018-12-18 LAB — BASIC METABOLIC PANEL
Anion gap: 6 (ref 5–15)
BUN: 21 mg/dL (ref 8–23)
CO2: 26 mmol/L (ref 22–32)
Calcium: 9.6 mg/dL (ref 8.9–10.3)
Chloride: 108 mmol/L (ref 98–111)
Creatinine, Ser: 1.01 mg/dL — ABNORMAL HIGH (ref 0.44–1.00)
GFR calc Af Amer: >60 mL/min (ref >60)
GFR calc non Af Amer: 54 mL/min — ABNORMAL LOW (ref >60)
Glucose, Bld: 109 mg/dL — ABNORMAL HIGH (ref 70–99)
Potassium: 3.9 mmol/L (ref 3.5–5.1)
Sodium: 140 mmol/L (ref 135–145)

## 2018-12-18 LAB — MAGNESIUM: Magnesium: 2.1 mg/dL (ref 1.7–2.4)

## 2018-12-18 LAB — TSH: TSH: 1.477 u[IU]/mL (ref 0.350–4.500)

## 2018-12-18 MED ORDER — LISINOPRIL 40 MG PO TABS: 40.0000 mg | ORAL_TABLET | Freq: Every day | ORAL | 0 refills | Status: DC

## 2018-12-18 MED ORDER — LISINOPRIL 20 MG PO TABS: 40.0000 mg | ORAL_TABLET | Freq: Every day | ORAL | Status: DC

## 2018-12-18 NOTE — H&P (View-Only) (Signed)
[address]       [place] [address]             [phone]        Jasmeen S Vaile [facility] Medical Record #[id number] Date of Birth: 1941/04/28  Referring:Der Meda Coffee Primary Care: Deland Pretty, MD Primary Cardiologist:Jonathan [name], MD  Chief Complaint:    Chief Complaint  Patient presents with  . Chest Pain  . Cough   Patient examined, images of last echocardiogram, cardiac CT and CT scan of chest personally reviewed and counseled with patient.  History of Present Illness:     Very nice 78 year old female reformed smoker with history of COPD and stage II breast cancer treated with right lumpectomy and radiation admitted for chest pain and cough.  She presented feeling that she probably had pneumonia since she has had that in the past.  However chest x-ray was clear and cardiac enzymes negative.  CT of the chest showed no pulmonary embolus, mild dilatation of ascending aorta 3.9 cm without dissection, and subsequent cardiac CT scan showed mild diffuse nonobstructive coronary disease.  The patient has history of known severe aortic insufficiency with fairly well preserved LV function until her most recent echo and April which showed EF had dropped to about 50%.  She is in sinus rhythm.  She has no other valvular disease.  During her hospitalization she was seen by cardiology, Dr. Meda Coffee who has recommended consideration of planning surgical AVR.  Is felt that her chest pain was probably due to hypertension and demand ischemia.  Patient's surgical history is positive for the right lumpectomy.  No history of thoracic trauma, rib fracture or pneumothorax.  The patient had PFTs 3 years ago at the time of the treatment for pneumonia which demonstrates reduced diffusion capacity of 45%.  The patient states she does have home oxygen to be used as needed.  Current Activity/ Functional Status: Patient is active lives with her husband   Zubrod Score: At the time  of surgery this patient's most appropriate activity status/level should be described as: _0     0    Normal activity, no symptoms _1     1    Restricted in physical strenuous activity but ambulatory, able to do out light work _2     2    Ambulatory and capable of self care, unable to do work activities, up and about                 more than 50%  Of the time                            _3     3    Only limited self care, in bed greater than 50% of waking hours _4     4    Completely disabled, no self care, confined to bed or chair _5     5    Moribund  Past Medical History:  Diagnosis Date  . Anemia    "several times over the years" (10/17/2012)  . Anxiety   . Breast cancer ([place]) 02/2010   right breast  . Carcinoma of breast treated with adjuvant hormone therapy ([place]) 9/11   [place]  . CHF (congestive heart failure) ([place])   . COPD (chronic obstructive pulmonary disease) ([place])   . Exertional shortness of breath    "recently" (10/17/2012)  . Hollenhorst plaque, left eye    apparently resolved at  recent ophthalmology visit  . Hyperlipidemia   . 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 Left ventricular dysfunction   . Moderate aortic insufficiency   . Pneumonia    "that's why I'm here" (10/17/2012)    Past Surgical History:  Procedure Laterality Date  . APPENDECTOMY  1974  . BREAST BIOPSY Right 02/27/10   Needle core Biopsy; Invasive Mammary; ER/PR Positive, Her-2 Neu negative, Ki-67 22%  . BREAST LUMPECTOMY WITH SENTINEL LYMPH NODE BIOPSY Right 07/03/11   Invasive Ductal Carcinoma;0/3 nodes negative,; ER,PR Positive, Her-2 Neg; Ki-67 22%  . [place]; 1974  . COLONOSCOPY    . DILATION AND CURETTAGE OF UTERUS  1970's  . TUBAL LIGATION  1974    Social History   Tobacco Use  Smoking Status Former Smoker  . Packs/day: 3.00  . Years: 30.00  . Pack years: 90.00  . Types: Cigarettes  . Start date: 05/19/1958  . Quit date: 06/30/1990  . Years since quitting: 28.4   Smokeless Tobacco Never Used  Tobacco Comment   Stopped smoking for 2-3 years during second child.     Social History   Substance and Sexual Activity  Alcohol Use Yes  . Alcohol/week: 1.0 standard drinks  . Types: 1 Glasses of wine per week   Comment: 10/17/2012 "3-4 glasses of wine/month"     No Known Allergies  Current Facility-Administered Medications  Medication Dose Route Frequency Provider Last Rate Last Dose  . acetaminophen (TYLENOL) tablet 650 mg  650 mg Oral Q4H PRN Darliss Cheney, MD      . aspirin EC tablet 81 mg  81 mg Oral Daily Darliss Cheney, MD   81 mg at 12/18/18 0914  . atorvastatin (LIPITOR) tablet 20 mg  20 mg Oral Daily Darliss Cheney, MD   20 mg at 12/18/18 0914  . carvedilol (COREG) tablet 6.25 mg  6.25 mg Oral BID WC Darliss Cheney, MD   6.25 mg at 12/18/18 0913  . enoxaparin (LOVENOX) injection 40 mg  40 mg Subcutaneous Q24H Darliss Cheney, MD   40 mg at 12/17/18 2139  . hydrALAZINE (APRESOLINE) injection 10 mg  10 mg Intravenous Q6H PRN Darliss Cheney, MD      . Derrill Memo ON 12/19/2018] lisinopril (ZESTRIL) tablet 40 mg  40 mg Oral Daily Dorothy Spark, MD      . nitroGLYCERIN (NITROSTAT) SL tablet 0.4 mg  0.4 mg Sublingual Q5 Min x 3 PRN Darliss Cheney, MD      . ondansetron (ZOFRAN) injection 4 mg  4 mg Intravenous Q6H PRN Darliss Cheney, MD      . sertraline (ZOLOFT) tablet 100 mg  100 mg Oral BID Darliss Cheney, MD   100 mg at 12/18/18 5009    Medications Prior to Admission  Medication Sig Dispense Refill Last Dose  . aspirin EC 81 MG tablet Take 81 mg by mouth daily.   12/16/2018 at Unknown time  . atorvastatin (LIPITOR) 20 MG tablet Take 20 mg by mouth daily.    12/16/2018 at Unknown time  . carvedilol (COREG) 6.25 MG tablet TAKE 1 TABLET(6.25 MG) BY MOUTH TWICE DAILY WITH A MEAL (Patient taking differently: Take 6.25 mg by mouth 2 (two) times daily with a meal. ) 180 tablet 1 12/16/2018 at 0830  . lisinopril (PRINIVIL,ZESTRIL) 5 MG tablet TAKE 1 TABLET BY  MOUTH ONCE DAILY 90 tablet 3 12/16/2018 at Unknown time  . sertraline (ZOLOFT) 100 MG tablet Take 100 mg by mouth 2 (two) times a day.  12/16/2018 at Unknown time  . vitamin B-12 (CYANOCOBALAMIN) 500 MCG tablet Take 500 mcg by mouth daily.   12/16/2018 at Unknown time    Family History  Problem Relation Age of Onset  . Heart disease Mother   . Cancer Father   . Heart disease Maternal Grandmother   . Heart disease Maternal Grandfather   . Cancer Paternal Grandmother   . Lung disease Neg Hx      Review of Systems:   ROS      Cardiac Review of Systems: Y or  [    ]= no  Chest Pain [  y  ]  Resting SOB [   ] Exertional SOB  [ y ]  Orthopnea [  ]   Pedal Edema [   ]    Palpitations [  ] Syncope  [  ]   Presyncope [   ]  General Review of Systems: [Y] = yes [  ]=no Constitional: recent weight change [  ]; anorexia [  ]; fatigue [  ]; nausea [  ]; night sweats [  ]; fever [  ]; or chills [  ]                                                               Dental: Last Dentist visit: 6 months  Eye : blurred vision [  ]; diplopia [   ]; vision changes [  ];  Amaurosis fugax[  ]; Resp: cough [  ];  wheezing[  ];  hemoptysis[  ]; shortness of breath[ y ]; paroxysmal nocturnal dyspnea[  ]; dyspnea on exertion[  ]; or orthopnea[  ];  GI:  gallstones[  ], vomiting[  ];  dysphagia[  ]; melena[  ];  hematochezia [  ]; heartburn[  ];   Hx of  Colonoscopy[  ]; GU: kidney stones [  ]; hematuria[  ];   dysuria [  ];  nocturia[  ];  history of     obstruction [  ]; urinary frequency [  ]             Skin: rash, swelling[  ];, hair loss[  ];  peripheral edema[  ];  or itching[  ]; Musculosketetal: myalgias[  ];  joint swelling[  ];  joint erythema[  ];  joint pain[  ];  back pain[  ];  Heme/Lymph: bruising[  ];  bleeding[  ];  anemia[  ];  Neuro: TIA[  ];  headaches[  ];  stroke[  ];  vertigo[  ];  seizures[  ];   paresthesias[  ];  difficulty walking[  ];  Psych:depression[  ]; anxiety[  ];  Endocrine:  diabetes[  ];  thyroid dysfunction[  ];                history of breast cancer treated followed by Dr. Jana Hakim              Physical Exam: BP (!) 148/47 (BP Location: Left Wrist)   Pulse (!) 57   Temp 98 F (36.7 C) (Oral)   Resp 12   Ht _0  (1.626 m)   Wt 53.2 kg   SpO2 91%   BMI 20.13 kg/m         Exam  General- alert and comfortable.  No distress.    Neck- no JVD, no cervical adenopathy palpable, no carotid bruit.  Dentition excellent condition   Lungs- clear without rales, wheezes   Cor- regular rate and rhythm, no murmur ,3/6 murmur of AI, no gallop   Abdomen- soft, non-tender   Extremities - warm, non-tender, minimal edema   Neuro- oriented, appropriate, no focal weakness   Diagnostic Studies & Laboratory data:     Recent Radiology Findings:   Ct Coronary Morph W/cta Cor W/score W/ca W/cm &/or Wo/cm  Result Date: 12/17/2018 CLINICAL DATA:  78 year old female with severe aortic insufficiency presenting with chest pain. EXAM: Cardiac/Coronary  CT TECHNIQUE: The patient was scanned on a [employer]. FINDINGS: A 120 kV prospective scan was triggered in the descending thoracic aorta at 111 HU's. Axial non-contrast 3 mm slices were carried out through the heart. The data set was analyzed on a dedicated work station and scored using the [place]. Gantry rotation speed was 250 msecs and collimation was .6 mm. No beta blockade and 0.8 mg of sl NTG was given. The 3D data set was reconstructed in 5% intervals of the 67-82 % of the R-R cycle. Diastolic phases were analyzed on a dedicated work station using MPR, MIP and VRT modes. The patient received 80 cc of contrast. Aorta: Upper normal size of the ascending aorta with maximum diameter 40 mm, mild diffuse atherosclerotic plaque and calcifications. No dissection. Aorta: Trileaflet aortic valve with severely thickened and mildly calcified leaflets with central non-coaptation. Sinotubular Junction: 31 x 31 mm  Ascending Thoracic Aorta: 40 x 39 mm Aortic Arch: 29 x 29 mm Descending Thoracic Aorta: 22.7 x 21.9 mm Sinus of Valsalva Measurements: Non-coronary: 30 mm Right -coronary: 30 mm Left -coronary: 33 mm Coronary Artery Height above Annulus: Left Main: 11 mm Right Coronary: 11 mm Virtual Basal Annulus Measurements: Maximum/Minimum Diameter: 25.5 x 20.6 mm Mean Diameter: 22.9 mm Perimeter: 73.2 mm Area: 411 mm2 Coronary Arteries:  Normal coronary origin.  Right dominance. RCA is a very large lumen dominant artery that gives rise to PDA and PLA. There is mild calcified plaque in the proximal to mid RCA with stenosis 25-49%. Left main is a large artery that gives rise to LAD and LCX arteries. Left main has minimal calcified plaque with stenosis 0-25%. LAD is a large vessel that gives rise to a large diagonal branch. There is a mild calcified plaque in the proximal portion with stenosis 25-49%. Mid and distal LAD and 1. diagonal artery have only minimal plaque. LCX is a non-dominant artery that gives rise to two OM branches. There is motion artifact however there appears to be mild mixed plaque in the proximal LCX artery with stenosis 25-49%. Other findings: Normal pulmonary vein drainage into the left atrium. Normal let atrial appendage without a thrombus. IMPRESSION: 1. Trileaflet aortic valve with severely thickened and mildly calcified leaflets with central non-coaptation. 2. Upper normal size of the ascending aorta with maximum diameter 40 mm, mild diffuse atherosclerotic plaque and calcifications. No dissection. 3. Coronary calcium score of 442. This was 58 percentile for age and sex matched control. 4. Normal coronary origin with right dominance. 5. Mild diffuse non-obstructive CAD. 6. Severely dilated pulmonary artery measuring 45 mm consistent with pulmonary hypertension. Electronically Signed   By: Ena Dawley   On: 12/17/2018 16:15   Ct Coronary Fractional Flow Reserve Data Prep  Result Date: 12/18/2018  EXAM: CT FFR ANALYSIS CLINICAL DATA:  78 year old female with chest  pain. FINDINGS: FFRct analysis was performed on the original cardiac CT angiogram dataset. Diagrammatic representation of the FFRct analysis is provided in a separate PDF document in PACS. This dictation was created using the PDF document and an interactive 3D model of the results. 3D model is not available in the EMR/PACS. Normal FFR range is >0.80. 1. Left Main:  No significant stenosis. 2. LAD: No significant stenosis. 3. LCX: No significant stenosis. 4. RCA: No significant stenosis. IMPRESSION: 1.  CT FFR analysis didn't show any significant stenosis. Electronically Signed   By: Ena Dawley   On: 12/18/2018 13:30     I have independently reviewed the above radiologic studies and discussed with the patient   Recent Lab Findings: Lab Results  Component Value Date   WBC 5.3 12/17/2018   HGB 12.3 12/17/2018   HCT 38.9 12/17/2018   PLT 211 12/17/2018   GLUCOSE 109 (H) 12/18/2018   CHOL 140 12/17/2018   TRIG 103 12/17/2018   HDL 38 (L) 12/17/2018   LDLCALC 81 12/17/2018   ALT 15 12/16/2018   AST 17 12/16/2018   NA 140 12/18/2018   K 3.9 12/18/2018   CL 108 12/18/2018   CREATININE 1.01 (H) 12/18/2018   BUN 21 12/18/2018   CO2 26 12/18/2018   TSH 1.477 12/18/2018   INR 1.00 (L) 07/25/2010   HGBA1C 5.7 (H) 02/16/2014      Assessment / Plan:   Severe aortic insufficiency with evidence of early development of LV dysfunction by serial echocardiograms  Hypertension with hypertensive crisis and demand ischemia resulting in hospitalization  COPD with home oxygen, history of smoking, PFTs 2018 with FVC 2.2 and FEV1 1.3 with DLCO only 43% predicted.  History of pneumonia 2 to 3 years ago     The patient has been recommended for aortic valve replacement for severe aortic insufficiency. Her major risk for surgery would be pulmonary due to her COPD.  Her PFTs will need to be repeated prior to surgery and she will  need a right heart cath with evidence of pulmonary hypertension on echo.  She has plans to have a hygienic care from her dentist and will be given a prescription for amoxicillin for valve prophylaxis.  Patient will need to be seen by her cardiologist Dr. [name] before surgery.  I plan on seeing her back after PFTs and her heart cath.  Dr. Quay Burow agrees with plan for surgery this will probably be scheduled for mid-to-late July.  _0 @ 12/18/2018 5:03 PM

## 2018-12-18 NOTE — Progress Notes (Signed)
Progress Note  Patient Name: Dana Pruitt Date of Encounter: 12/18/2018  Primary Cardiologist: Quay Burow, MD   Subjective   She feels slightly better today, no chest pain.  Inpatient Medications    Scheduled Meds:  aspirin EC  81 mg Oral Daily   atorvastatin  20 mg Oral Daily   carvedilol  6.25 mg Oral BID WC   enoxaparin (LOVENOX) injection  40 mg Subcutaneous Q24H   lisinopril  20 mg Oral Daily   sertraline  100 mg Oral BID   Continuous Infusions:  PRN Meds: acetaminophen, hydrALAZINE, nitroGLYCERIN, ondansetron (ZOFRAN) IV   Vital Signs    Vitals:   12/17/18 1602 12/17/18 2105 12/17/18 2106 12/18/18 0508  BP: (!) 129/53 (!) 197/68 (!) 162/68 (!) 148/47  Pulse: 66 64  (!) 57  Resp: 14 16  12   Temp: 98 F (36.7 C) 98.3 F (36.8 C)  98 F (36.7 C)  TempSrc: Oral Oral  Oral  SpO2: 95% 93%  91%  Weight:    53.2 kg  Height:        Intake/Output Summary (Last 24 hours) at 12/18/2018 0943 Last data filed at 12/18/2018 0200 Gross per 24 hour  Intake 240 ml  Output --  Net 240 ml   Last 3 Weights 12/18/2018 12/17/2018 12/16/2018  Weight (lbs) 117 lb 4.6 oz 125 lb 120 lb  Weight (kg) 53.2 kg 56.7 kg 54.432 kg     Telemetry    SR, PVCs - Personally Reviewed  ECG    SR, LVH - Personally Reviewed  Physical Exam   GEN: No acute distress.   Neck: No JVD Cardiac: RRR, 3/6 diastolic murmur, rubs, or gallops.  Respiratory: Clear to auscultation bilaterally. GI: Soft, nontender, non-distended  MS: No edema; No deformity. Neuro:  Nonfocal  Psych: Normal affect   Labs    High Sensitivity Troponin:   Recent Labs  Lab 12/16/18 0934 12/16/18 1346 12/16/18 1650 12/16/18 1816  TROPONINIHS 10 8.0 10 10.0     Cardiac EnzymesNo results for input(s): TROPONINI in the last 168 hours. No results for input(s): TROPIPOC in the last 168 hours.   Chemistry Recent Labs  Lab 12/16/18 0934 12/16/18 1650 12/17/18 0430 12/18/18 0440  NA 139  --  140  140  K 4.3  --  4.1 3.9  CL 107  --  107 108  CO2 24  --  25 26  GLUCOSE 110*  --  102* 109*  BUN 17  --  19 21  CREATININE 1.01* 0.98 1.07* 1.01*  CALCIUM 9.7  --  9.6 9.6  PROT 7.0  --   --   --   ALBUMIN 4.0  --   --   --   AST 17  --   --   --   ALT 15  --   --   --   ALKPHOS 35*  --   --   --   BILITOT 0.7  --   --   --   GFRNONAA 54* 56* 50* 54*  GFRAA >60 >60 58* >60  ANIONGAP 8  --  8 6    Hematology Recent Labs  Lab 12/16/18 0934 12/16/18 1650 12/17/18 0430  WBC 5.4 5.4 5.3  RBC 4.14 3.93 3.94  HGB 12.8 12.4 12.3  HCT 39.9 37.9 38.9  MCV 96.4 96.4 98.7  MCH 30.9 31.6 31.2  MCHC 32.1 32.7 31.6  RDW 13.4 13.2 13.2  PLT 212 207 211    BNP  Recent Labs  Lab 12/16/18 0934  BNP 321.3*     DDimer  Recent Labs  Lab 12/16/18 1346  DDIMER 0.38    Radiology    Ct Coronary Morph W/cta Cor W/score W/ca W/cm &/or Wo/cm  Result Date: 12/17/2018 CLINICAL DATA:  78 year old female with severe aortic insufficiency presenting with chest pain. EXAM: Cardiac/Coronary  CT TECHNIQUE: The patient was scanned on a [employer]. FINDINGS: A 120 kV prospective scan was triggered in the descending thoracic aorta at 111 HU's. Axial non-contrast 3 mm slices were carried out through the heart. The data set was analyzed on a dedicated work station and scored using the [place]. Gantry rotation speed was 250 msecs and collimation was .6 mm. No beta blockade and 0.8 mg of sl NTG was given. The 3D data set was reconstructed in 5% intervals of the 67-82 % of the R-R cycle. Diastolic phases were analyzed on a dedicated work station using MPR, MIP and VRT modes. The patient received 80 cc of contrast. Aorta: Upper normal size of the ascending aorta with maximum diameter 40 mm, mild diffuse atherosclerotic plaque and calcifications. No dissection. Aorta: Trileaflet aortic valve with severely thickened and mildly calcified leaflets with central non-coaptation. Sinotubular  Junction: 31 x 31 mm Ascending Thoracic Aorta: 40 x 39 mm Aortic Arch: 29 x 29 mm Descending Thoracic Aorta: 22.7 x 21.9 mm Sinus of Valsalva Measurements: Non-coronary: 30 mm Right -coronary: 30 mm Left -coronary: 33 mm Coronary Artery Height above Annulus: Left Main: 11 mm Right Coronary: 11 mm Virtual Basal Annulus Measurements: Maximum/Minimum Diameter: 25.5 x 20.6 mm Mean Diameter: 22.9 mm Perimeter: 73.2 mm Area: 411 mm2 Coronary Arteries:  Normal coronary origin.  Right dominance. RCA is a very large lumen dominant artery that gives rise to PDA and PLA. There is mild calcified plaque in the proximal to mid RCA with stenosis 25-49%. Left main is a large artery that gives rise to LAD and LCX arteries. Left main has minimal calcified plaque with stenosis 0-25%. LAD is a large vessel that gives rise to a large diagonal branch. There is a mild calcified plaque in the proximal portion with stenosis 25-49%. Mid and distal LAD and 1. diagonal artery have only minimal plaque. LCX is a non-dominant artery that gives rise to two OM branches. There is motion artifact however there appears to be mild mixed plaque in the proximal LCX artery with stenosis 25-49%. Other findings: Normal pulmonary vein drainage into the left atrium. Normal let atrial appendage without a thrombus. IMPRESSION: 1. Trileaflet aortic valve with severely thickened and mildly calcified leaflets with central non-coaptation. 2. Upper normal size of the ascending aorta with maximum diameter 40 mm, mild diffuse atherosclerotic plaque and calcifications. No dissection. 3. Coronary calcium score of 442. This was 2 percentile for age and sex matched control. 4. Normal coronary origin with right dominance. 5. Mild diffuse non-obstructive CAD. 6. Severely dilated pulmonary artery measuring 45 mm consistent with pulmonary hypertension. Electronically Signed   By: Ena Dawley   On: 12/17/2018 16:15   Cardiac Studies   12/17/2018 - Coronary CTA   1.  Trileaflet aortic valve with severely thickened and mildly calcified leaflets with central non-coaptation.  2. Upper normal size of the ascending aorta with maximum diameter 40 mm, mild diffuse atherosclerotic plaque and calcifications. No dissection.  3. Coronary calcium score of 442. This was 7 percentile for age and sex matched control.  4. Normal coronary origin with right dominance.  5. Mild  diffuse non-obstructive CAD.  6. Severely dilated pulmonary artery measuring 45 mm consistent with pulmonary hypertension.    Patient Profile     78 y.o. female with severe aortic insufficiency, non-ischemic cardiomyopathy admitted with acute systolic CHF, valvular in etiology  Assessment & Plan    1. Severe aortic insufficiency  1. With worsening LVEF, moderate pulmonary hypertension, now with acute combined systolic and diastolic CHF, LVEF 28-20%, we will call CT surgery to evaluate for timing of surgery.  2. Chest pain - coronary CTA showed mild non-obstructive CAD, if she undergoes AVR she will not requite cardiac catheterization. Chest pain most probably sec to hypertensive urgency and demand ischemia.  3. Mild non-obstructive CAD - we will continue atorvastatin 4. Hypertensive urgency - we increased lisinopril to 20 mg po daily, I will further increase to 40 mg po daily 5. Hyperlipidemia-on therapy with atorvastatin  For questions or updates, please contact [place] Please consult [email or web address] for contact info under     Signed, Ena Dawley, MD  12/18/2018, 9:43 AM

## 2018-12-18 NOTE — Progress Notes (Deleted)
PROGRESS NOTE  Dana Pruitt EYC:[id number] DOB: [birth year] DOA: 12/16/2018 PCP: Deland Pretty, MD  Brief History:  H/o AI, chf, copd presents with chest tightness and hypoxia Cardiology and cardiothoracic surgery consulted,   HPI/Recap of past 24 hours:  Continue to have chest tightness, denies chest pain, no dizziness, no edema, no cough, no fever no hypoxia at rest  Assessment/Plan: Active Problems:   HLD (hyperlipidemia)   Severe aortic regurgitation   Chest pain   Chronic systolic CHF (congestive heart failure) ([place])   Hypertensive urgency   Sepsis secondary to UTI ([place])   Chest tightness   Acute on chronic combined systolic and diastolic CHF (congestive heart failure) ([place])   Pulmonary hypertension ([place])   Acute hypoxic respiratory failure -Reports DOE , hypoxic , chest tightness for the last few days -High sensitivity troponin unremarkable, cxr no acute findings, no cough, no fever, tele unremarkable -she underwent cardiac CTA -severe aroticinsufficiency -moderate pulmonary hypertension. -recent echo with progressive  reduced EF , combined systolic and diastolic chf -cardiology consulted who contacted thoracic surgery for surgery evaluation    Chronic combined chf with progressive reduced EF euvolemic , on coreg,  lisinoprildose increased per cardiology recommendation  HTN:  Continue Coreg,  Lisinopril dose increased per cardiology recommendation to 40mg  daily for better bp control     COPD, moderate  Reports a heavy smoker before but quit 58yrs ago No wheezing, lung clear  SARS cov 2 screening negative   Code Status: full  Family Communication: patient   Disposition Plan: not ready to discharge today, need to wean o2, needs ambulate o2 sats, needs cardiology/cardiothracic surgery clearance    Consultants:  Cardiology  Cardiothoracic surgery  Procedures:  none  Antibiotics:  none   Objective: BP (!) 148/47 (BP Location:  Left Wrist)    Pulse (!) 57    Temp 98 F (36.7 C) (Oral)    Resp 12    Ht 5\' 4"  (1.626 m)    Wt 53.2 kg    SpO2 91%    BMI 20.13 kg/m   Intake/Output Summary (Last 24 hours) at 12/18/2018 1443 Last data filed at 12/18/2018 0200 Gross per 24 hour  Intake 240 ml  Output --  Net 240 ml   Filed Weights   12/16/18 0929 12/17/18 0443 12/18/18 0508  Weight: 54.4 kg 56.7 kg 53.2 kg    Exam: Patient is examined daily including today on 12/18/2018, exams remain the same as of yesterday except that has changed    General:  NAD  Cardiovascular: RRR  Respiratory: CTABL  Abdomen: Soft/ND/NT, positive BS  Musculoskeletal: No Edema  Neuro: alert, oriented   Data Reviewed: Basic Metabolic Panel: Recent Labs  Lab 12/16/18 0934 12/16/18 1650 12/17/18 0430 12/18/18 0440  NA 139  --  140 140  K 4.3  --  4.1 3.9  CL 107  --  107 108  CO2 24  --  25 26  GLUCOSE 110*  --  102* 109*  BUN 17  --  19 21  CREATININE 1.01* 0.98 1.07* 1.01*  CALCIUM 9.7  --  9.6 9.6  MG  --  2.0  --  2.1   Liver Function Tests: Recent Labs  Lab 12/16/18 0934  AST 17  ALT 15  ALKPHOS 35*  BILITOT 0.7  PROT 7.0  ALBUMIN 4.0   No results for input(s): LIPASE, AMYLASE in the last 168 hours. No results for input(s): AMMONIA in the last 168 hours. CBC: Recent Labs  Lab 12/16/18 0934 12/16/18 1650 12/17/18 0430  WBC 5.4 5.4 5.3  HGB 12.8 12.4 12.3  HCT 39.9 37.9 38.9  MCV 96.4 96.4 98.7  PLT 212 207 211   Cardiac Enzymes:   No results for input(s): CKTOTAL, CKMB, CKMBINDEX, TROPONINI in the last 168 hours. BNP (last 3 results) Recent Labs    12/16/18 0934  BNP 321.3*    ProBNP (last 3 results) No results for input(s): PROBNP in the last 8760 hours.  CBG: No results for input(s): GLUCAP in the last 168 hours.  Recent Results (from the past 240 hour(s))  SARS Coronavirus 2 (CEPHEID- Performed in [place] hospital lab), Hosp Order     Status: None   Collection Time: 12/16/18  2:50  PM   Specimen: Nasopharyngeal Swab  Result Value Ref Range Status   SARS Coronavirus 2 NEGATIVE NEGATIVE Final    Comment: (NOTE) If result is NEGATIVE SARS-CoV-2 target nucleic acids are NOT DETECTED. The SARS-CoV-2 RNA is generally detectable in upper and lower  respiratory specimens during the acute phase of infection. The lowest  concentration of SARS-CoV-2 viral copies this assay can detect is 250  copies / mL. A negative result does not preclude SARS-CoV-2 infection  and should not be used as the sole basis for treatment or other  patient management decisions.  A negative result may occur with  improper specimen collection / handling, submission of specimen other  than nasopharyngeal swab, presence of viral mutation(s) within the  areas targeted by this assay, and inadequate number of viral copies  (<250 copies / mL). A negative result must be combined with clinical  observations, patient history, and epidemiological information. If result is POSITIVE SARS-CoV-2 target nucleic acids are DETECTED. The SARS-CoV-2 RNA is generally detectable in upper and lower  respiratory specimens dur ing the acute phase of infection.  Positive  results are indicative of active infection with SARS-CoV-2.  Clinical  correlation with patient history and other diagnostic information is  necessary to determine patient infection status.  Positive results do  not rule out bacterial infection or co-infection with other viruses. If result is PRESUMPTIVE POSTIVE SARS-CoV-2 nucleic acids MAY BE PRESENT.   A presumptive positive result was obtained on the submitted specimen  and confirmed on repeat testing.  While 2019 novel coronavirus  (SARS-CoV-2) nucleic acids may be present in the submitted sample  additional confirmatory testing may be necessary for epidemiological  and / or clinical management purposes  to differentiate between  SARS-CoV-2 and other Sarbecovirus currently known to infect humans.    If clinically indicated additional testing with an alternate test  methodology [phone]) is advised. The SARS-CoV-2 RNA is generally  detectable in upper and lower respiratory sp ecimens during the acute  phase of infection. The expected result is Negative. Fact Sheet for Patients:  StrictlyIdeas.no Fact Sheet for Healthcare Providers: BankingDealers.co.za This test is not yet approved or cleared by the [place] FDA and has been authorized for detection and/or diagnosis of SARS-CoV-2 by FDA under an Emergency Use Authorization (EUA).  This EUA will remain in effect (meaning this test can be used) for the duration of the COVID-19 declaration under Section 564(b)(1) of the Act, 21 U.S.C. section 360bbb-3(b)(1), unless the authorization is terminated or revoked sooner. Performed at [facility], [place] [address], [place], [place] [address]      Studies: Ct Coronary Morph W/cta Cor W/score W/ca W/cm &/or Wo/cm  Result Date: 12/17/2018 CLINICAL DATA:  78 year old female with  severe aortic insufficiency presenting with chest pain. EXAM: Cardiac/Coronary  CT TECHNIQUE: The patient was scanned on a [employer]. FINDINGS: A 120 kV prospective scan was triggered in the descending thoracic aorta at 111 HU's. Axial non-contrast 3 mm slices were carried out through the heart. The data set was analyzed on a dedicated work station and scored using the [place]. Gantry rotation speed was 250 msecs and collimation was .6 mm. No beta blockade and 0.8 mg of sl NTG was given. The 3D data set was reconstructed in 5% intervals of the 67-82 % of the R-R cycle. Diastolic phases were analyzed on a dedicated work station using MPR, MIP and VRT modes. The patient received 80 cc of contrast. Aorta: Upper normal size of the ascending aorta with maximum diameter 40 mm, mild diffuse atherosclerotic plaque and calcifications. No  dissection. Aorta: Trileaflet aortic valve with severely thickened and mildly calcified leaflets with central non-coaptation. Sinotubular Junction: 31 x 31 mm Ascending Thoracic Aorta: 40 x 39 mm Aortic Arch: 29 x 29 mm Descending Thoracic Aorta: 22.7 x 21.9 mm Sinus of Valsalva Measurements: Non-coronary: 30 mm Right -coronary: 30 mm Left -coronary: 33 mm Coronary Artery Height above Annulus: Left Main: 11 mm Right Coronary: 11 mm Virtual Basal Annulus Measurements: Maximum/Minimum Diameter: 25.5 x 20.6 mm Mean Diameter: 22.9 mm Perimeter: 73.2 mm Area: 411 mm2 Coronary Arteries:  Normal coronary origin.  Right dominance. RCA is a very large lumen dominant artery that gives rise to PDA and PLA. There is mild calcified plaque in the proximal to mid RCA with stenosis 25-49%. Left main is a large artery that gives rise to LAD and LCX arteries. Left main has minimal calcified plaque with stenosis 0-25%. LAD is a large vessel that gives rise to a large diagonal branch. There is a mild calcified plaque in the proximal portion with stenosis 25-49%. Mid and distal LAD and 1. diagonal artery have only minimal plaque. LCX is a non-dominant artery that gives rise to two OM branches. There is motion artifact however there appears to be mild mixed plaque in the proximal LCX artery with stenosis 25-49%. Other findings: Normal pulmonary vein drainage into the left atrium. Normal let atrial appendage without a thrombus. IMPRESSION: 1. Trileaflet aortic valve with severely thickened and mildly calcified leaflets with central non-coaptation. 2. Upper normal size of the ascending aorta with maximum diameter 40 mm, mild diffuse atherosclerotic plaque and calcifications. No dissection. 3. Coronary calcium score of 442. This was 60 percentile for age and sex matched control. 4. Normal coronary origin with right dominance. 5. Mild diffuse non-obstructive CAD. 6. Severely dilated pulmonary artery measuring 45 mm consistent with pulmonary  hypertension. Electronically Signed   By: Ena Dawley   On: 12/17/2018 16:15   Ct Coronary Fractional Flow Reserve Data Prep  Result Date: 12/18/2018 EXAM: CT FFR ANALYSIS CLINICAL DATA:  78 year old female with chest pain. FINDINGS: FFRct analysis was performed on the original cardiac CT angiogram dataset. Diagrammatic representation of the FFRct analysis is provided in a separate PDF document in PACS. This dictation was created using the PDF document and an interactive 3D model of the results. 3D model is not available in the EMR/PACS. Normal FFR range is >0.80. 1. Left Main:  No significant stenosis. 2. LAD: No significant stenosis. 3. LCX: No significant stenosis. 4. RCA: No significant stenosis. IMPRESSION: 1.  CT FFR analysis didn't show any significant stenosis. Electronically Signed   By: Ena Dawley   On: 12/18/2018 13:30  Scheduled Meds:  aspirin EC  81 mg Oral Daily   atorvastatin  20 mg Oral Daily   carvedilol  6.25 mg Oral BID WC   enoxaparin (LOVENOX) injection  40 mg Subcutaneous Q24H   [START ON 12/19/2018] lisinopril  40 mg Oral Daily   sertraline  100 mg Oral BID    Continuous Infusions:   Time spent: 35 mins, case discussed with cardiology  I have personally reviewed and interpreted on  12/18/2018 daily labs,  Tele strips, imagings as discussed above under date review session and assessment and plans.  I reviewed all nursing notes, pharmacy notes, consultant notes,  vitals, pertinent old records  I have discussed plan of care as described above with RN , patient  on 12/18/2018   Florencia Reasons MD, PhD  [facility] Pager [phone]. If 7PM-7AM, please contact night-coverage at [email or web address], password [email or web address] 12/18/2018, 2:43 PM  LOS: 2 days

## 2018-12-18 NOTE — Progress Notes (Signed)
SATURATION QUALIFICATIONS: (This note is used to comply with regulatory documentation for home oxygen)  Patient Saturations on Room Air at Rest = 94%  Patient Saturations on Room Air while Ambulating = 88%  Patient Saturations on 0 Liters of oxygen while Ambulating = 88%  Please briefly explain why patient needs home oxygen: Pt states that she has O2 monitor at home and it is not abnormal for her to drop to 88%.

## 2018-12-18 NOTE — Discharge Summary (Signed)
Discharge Summary  MACKINZE CRIADO FXT:[id number] DOB: Oct 29, 1940  PCP: Deland Pretty, MD  Admit date: 12/16/2018 Discharge date: 12/18/2018  Time spent: 10mins, more than 50% time spent on coordination of care.  Recommendations for Outpatient Follow-up:  1. F/u with PCP within a week  for hospital discharge follow up, repeat cbc/bmp at follow up 2. F/u with cardiology Dr [name]  3. F/u with cardiothoracic surgery Dr Prescott Gum   Discharge Diagnoses:  Active Hospital Problems   Diagnosis Date Noted   Chest tightness    Acute on chronic combined systolic and diastolic CHF (congestive heart failure) (HCC)    Pulmonary hypertension (HCC)    Chest pain 29/92/4268   Chronic systolic CHF (congestive heart failure) ([place]) 12/16/2018   Hypertensive urgency 12/16/2018   Sepsis secondary to UTI ([place]) 12/16/2018   Severe aortic regurgitation 09/05/2014   HLD (hyperlipidemia) 03/28/2014    Resolved Hospital Problems  No resolved problems to display.    Discharge Condition: stable  Diet recommendation: heart healthy  Filed Weights   12/16/18 0929 12/17/18 0443 12/18/18 0508  Weight: 54.4 kg 56.7 kg 53.2 kg    History of present illness: (per admitting MD Dr Doristine Bosworth) PCP: Deland Pretty, MD  Patient coming from: Home  I have personally briefly reviewed patient's old medical records in [place]  Chief Complaint: Chest pain  HPI: Dana Pruitt is a 78 y.o. female with medical history significant of hypertension, hyperlipidemia, anxiety, chronic systolic congestive heart failure, COPD and severe aortic insufficiency presented to ER with a complaint of chest pain.  According to patient, she started chest pain about 3 days ago.  She describes pain as pressure, 2-3 out of 10, nonradiating with no aggravating or relieving factor and has been intermittent.  She has some shortness of breath, nausea and some chronic cough due to GERD.  She tells me that she has been taking  her oxygen saturation at home and she has been ranging anywhere from 87% to 93% for past 3 days.  She denies any fever, dizziness, diaphoresis, any sick contact or any recent travel.  ED Course: Upon arrival to ED, she had significantly elevated blood pressure and some bradycardia but otherwise she was stable.  Basic lab work including CBC and CMP was within normal limit as well as troponin were negative.  BNP was slightly elevated.  ED physician discussed the case with cardiologist as she has already established relationship with them and they recommended admitting for observation and they will see her in consultation tomorrow.  No therapeutic anticoagulation was recommended.  Hospital Course:  Active Problems:   HLD (hyperlipidemia)   Severe aortic regurgitation   Chest pain   Chronic systolic CHF (congestive heart failure) (HCC)   Hypertensive urgency   Sepsis secondary to UTI (HCC)   Chest tightness   Acute on chronic combined systolic and diastolic CHF (congestive heart failure) (HCC)   Pulmonary hypertension (HCC)   Acute hypoxic respiratory failure -Reports DOE , hypoxic , chest tightness for the last few days -High sensitivity troponin unremarkable, cxr no acute findings, no cough, no fever, tele unremarkable -severe arotic insufficiency -moderate pulmonary hypertension. -recent echo with progressive  reduced EF , combined systolic and diastolic chf -cardiology consulted who contacted thoracic surgery for surgery evaluation  -patient is feeling better and is eager to go home , she is cleared by cardiology and thoracic surgery to discharge home with close outpatient follow up.  Chronic combined chf with progressive reduced EF euvolemic ,  on coreg, lisinopril   HTN:  Continue Coreg,  Lisinopril dose increased per cardiology recommendation to 40mg  daily for better bp control   COPD, moderate  Reports a heavy smoker before but quit 12yrs ago No wheezing, lung clear  SARS  cov 2 screening negative   Code Status: full  Family Communication: patient   Disposition Plan: home with  cardiology clearance and cardiothoracic surgery clearance    Consultants:  Cardiology  cardiothoracic   Procedures:  none  Antibiotics:  none   Discharge Exam: BP (!) 148/47 (BP Location: Left Wrist)    Pulse (!) 57    Temp 98 F (36.7 C) (Oral)    Resp 12    Ht 5\' 4"  (1.626 m)    Wt 53.2 kg    SpO2 91%    BMI 20.13 kg/m   General: NAD Cardiovascular: RRR Respiratory: CTABL  Discharge Instructions You were cared for by a hospitalist during your hospital stay. If you have any questions about your discharge medications or the care you received while you were in the hospital after you are discharged, you can call the unit and asked to speak with the hospitalist on call if the hospitalist that took care of you is not available. Once you are discharged, your primary care physician will handle any further medical issues. Please note that NO REFILLS for any discharge medications will be authorized once you are discharged, as it is imperative that you return to your primary care physician (or establish a relationship with a primary care physician if you do not have one) for your aftercare needs so that they can reassess your need for medications and monitor your lab values.  Discharge Instructions    Diet - low sodium heart healthy   Complete by: As directed    Increase activity slowly   Complete by: As directed      Allergies as of 12/18/2018   No Known Allergies     Medication List    TAKE these medications   aspirin EC 81 MG tablet Take 81 mg by mouth daily.   atorvastatin 20 MG tablet Commonly known as: LIPITOR Take 20 mg by mouth daily.   carvedilol 6.25 MG tablet Commonly known as: COREG TAKE 1 TABLET(6.25 MG) BY MOUTH TWICE DAILY WITH A MEAL What changed: See the new instructions.   lisinopril 40 MG tablet Commonly known as: ZESTRIL Take 1  tablet (40 mg total) by mouth daily. Start taking on: December 19, 2018 What changed:   medication strength  how much to take   sertraline 100 MG tablet Commonly known as: ZOLOFT Take 100 mg by mouth 2 (two) times a day.   vitamin B-12 500 MCG tablet Commonly known as: CYANOCOBALAMIN Take 500 mcg by mouth daily.      No Known Allergies Follow-up Information    Deland Pretty, MD Follow up.   Specialty: Internal Medicine Contact information: [address] [place] [address] [phone]        Lorretta Harp, MD Follow up in 1 week(s).   Specialties: Cardiology, Radiology Contact information: [address] [place] [address] [phone]        Prescott Gum, Collier Salina, MD Follow up in 1 week(s).   Specialty: Cardiothoracic Surgery Contact information: [address] [place] [address] [phone]            The results of significant diagnostics from this hospitalization (including imaging, microbiology, ancillary and laboratory) are  listed below for reference.    Significant Diagnostic Studies: Dg Chest 2 View  Result Date: 12/16/2018 CLINICAL DATA:  Chest tightness. History of breast carcinoma. Decreased oxygen saturation EXAM: CHEST - 2 VIEW COMPARISON:  December 29, 2016 FINDINGS: There is no edema or consolidation. The heart size and pulmonary vascularity are normal. No adenopathy. There is aortic atherosclerosis. No bone lesions. IMPRESSION: No edema or consolidation. Stable cardiac silhouette. Aortic Atherosclerosis (ICD10-I70.0). Electronically Signed   By: Lowella Grip III M.D.   On: 12/16/2018 09:59   Ct Coronary Morph W/cta Cor W/score W/ca W/cm &/or Wo/cm  Result Date: 12/17/2018 CLINICAL DATA:  78 year old female with severe aortic insufficiency presenting with chest pain. EXAM: Cardiac/Coronary  CT TECHNIQUE: The patient was scanned on a [employer]. FINDINGS: A 120 kV prospective scan  was triggered in the descending thoracic aorta at 111 HU's. Axial non-contrast 3 mm slices were carried out through the heart. The data set was analyzed on a dedicated work station and scored using the [place]. Gantry rotation speed was 250 msecs and collimation was .6 mm. No beta blockade and 0.8 mg of sl NTG was given. The 3D data set was reconstructed in 5% intervals of the 67-82 % of the R-R cycle. Diastolic phases were analyzed on a dedicated work station using MPR, MIP and VRT modes. The patient received 80 cc of contrast. Aorta: Upper normal size of the ascending aorta with maximum diameter 40 mm, mild diffuse atherosclerotic plaque and calcifications. No dissection. Aorta: Trileaflet aortic valve with severely thickened and mildly calcified leaflets with central non-coaptation. Sinotubular Junction: 31 x 31 mm Ascending Thoracic Aorta: 40 x 39 mm Aortic Arch: 29 x 29 mm Descending Thoracic Aorta: 22.7 x 21.9 mm Sinus of Valsalva Measurements: Non-coronary: 30 mm Right -coronary: 30 mm Left -coronary: 33 mm Coronary Artery Height above Annulus: Left Main: 11 mm Right Coronary: 11 mm Virtual Basal Annulus Measurements: Maximum/Minimum Diameter: 25.5 x 20.6 mm Mean Diameter: 22.9 mm Perimeter: 73.2 mm Area: 411 mm2 Coronary Arteries:  Normal coronary origin.  Right dominance. RCA is a very large lumen dominant artery that gives rise to PDA and PLA. There is mild calcified plaque in the proximal to mid RCA with stenosis 25-49%. Left main is a large artery that gives rise to LAD and LCX arteries. Left main has minimal calcified plaque with stenosis 0-25%. LAD is a large vessel that gives rise to a large diagonal branch. There is a mild calcified plaque in the proximal portion with stenosis 25-49%. Mid and distal LAD and 1. diagonal artery have only minimal plaque. LCX is a non-dominant artery that gives rise to two OM branches. There is motion artifact however there appears to be mild mixed plaque in the  proximal LCX artery with stenosis 25-49%. Other findings: Normal pulmonary vein drainage into the left atrium. Normal let atrial appendage without a thrombus. IMPRESSION: 1. Trileaflet aortic valve with severely thickened and mildly calcified leaflets with central non-coaptation. 2. Upper normal size of the ascending aorta with maximum diameter 40 mm, mild diffuse atherosclerotic plaque and calcifications. No dissection. 3. Coronary calcium score of 442. This was 35 percentile for age and sex matched control. 4. Normal coronary origin with right dominance. 5. Mild diffuse non-obstructive CAD. 6. Severely dilated pulmonary artery measuring 45 mm consistent with pulmonary hypertension. Electronically Signed   By: Ena Dawley   On: 12/17/2018 16:15   Ct Coronary Fractional Flow Reserve Data Prep  Result Date: 12/18/2018 EXAM: CT  FFR ANALYSIS CLINICAL DATA:  78 year old female with chest pain. FINDINGS: FFRct analysis was performed on the original cardiac CT angiogram dataset. Diagrammatic representation of the FFRct analysis is provided in a separate PDF document in PACS. This dictation was created using the PDF document and an interactive 3D model of the results. 3D model is not available in the EMR/PACS. Normal FFR range is >0.80. 1. Left Main:  No significant stenosis. 2. LAD: No significant stenosis. 3. LCX: No significant stenosis. 4. RCA: No significant stenosis. IMPRESSION: 1.  CT FFR analysis didn't show any significant stenosis. Electronically Signed   By: Ena Dawley   On: 12/18/2018 13:30    Microbiology: Recent Results (from the past 240 hour(s))  SARS Coronavirus 2 (CEPHEID- Performed in [facility] hospital lab), Hosp Order     Status: None   Collection Time: 12/16/18  2:50 PM   Specimen: Nasopharyngeal Swab  Result Value Ref Range Status   SARS Coronavirus 2 NEGATIVE NEGATIVE Final    Comment: (NOTE) If result is NEGATIVE SARS-CoV-2 target nucleic acids are NOT DETECTED. The  SARS-CoV-2 RNA is generally detectable in upper and lower  respiratory specimens during the acute phase of infection. The lowest  concentration of SARS-CoV-2 viral copies this assay can detect is 250  copies / mL. A negative result does not preclude SARS-CoV-2 infection  and should not be used as the sole basis for treatment or other  patient management decisions.  A negative result may occur with  improper specimen collection / handling, submission of specimen other  than nasopharyngeal swab, presence of viral mutation(s) within the  areas targeted by this assay, and inadequate number of viral copies  (<250 copies / mL). A negative result must be combined with clinical  observations, patient history, and epidemiological information. If result is POSITIVE SARS-CoV-2 target nucleic acids are DETECTED. The SARS-CoV-2 RNA is generally detectable in upper and lower  respiratory specimens dur ing the acute phase of infection.  Positive  results are indicative of active infection with SARS-CoV-2.  Clinical  correlation with patient history and other diagnostic information is  necessary to determine patient infection status.  Positive results do  not rule out bacterial infection or co-infection with other viruses. If result is PRESUMPTIVE POSTIVE SARS-CoV-2 nucleic acids MAY BE PRESENT.   A presumptive positive result was obtained on the submitted specimen  and confirmed on repeat testing.  While 2019 novel coronavirus  (SARS-CoV-2) nucleic acids may be present in the submitted sample  additional confirmatory testing may be necessary for epidemiological  and / or clinical management purposes  to differentiate between  SARS-CoV-2 and other Sarbecovirus currently known to infect humans.  If clinically indicated additional testing with an alternate test  methodology [id number]) is advised. The SARS-CoV-2 RNA is generally  detectable in upper and lower respiratory sp ecimens during the acute    phase of infection. The expected result is Negative. Fact Sheet for Patients:  StrictlyIdeas.no Fact Sheet for Healthcare Providers: BankingDealers.co.za This test is not yet approved or cleared by the [place] FDA and has been authorized for detection and/or diagnosis of SARS-CoV-2 by FDA under an Emergency Use Authorization (EUA).  This EUA will remain in effect (meaning this test can be used) for the duration of the COVID-19 declaration under Section 564(b)(1) of the Act, 21 U.S.C. section 360bbb-3(b)(1), unless the authorization is terminated or revoked sooner. Performed at [facility], [place] [address] [place], [place] [address]  Labs: Basic Metabolic Panel: Recent Labs  Lab 12/16/18 0934 12/16/18 1650 12/17/18 0430 12/18/18 0440  NA 139  --  140 140  K 4.3  --  4.1 3.9  CL 107  --  107 108  CO2 24  --  25 26  GLUCOSE 110*  --  102* 109*  BUN 17  --  19 21  CREATININE 1.01* 0.98 1.07* 1.01*  CALCIUM 9.7  --  9.6 9.6  MG  --  2.0  --  2.1   Liver Function Tests: Recent Labs  Lab 12/16/18 0934  AST 17  ALT 15  ALKPHOS 35*  BILITOT 0.7  PROT 7.0  ALBUMIN 4.0   No results for input(s): LIPASE, AMYLASE in the last 168 hours. No results for input(s): AMMONIA in the last 168 hours. CBC: Recent Labs  Lab 12/16/18 0934 12/16/18 1650 12/17/18 0430  WBC 5.4 5.4 5.3  HGB 12.8 12.4 12.3  HCT 39.9 37.9 38.9  MCV 96.4 96.4 98.7  PLT 212 207 211   Cardiac Enzymes: No results for input(s): CKTOTAL, CKMB, CKMBINDEX, TROPONINI in the last 168 hours. BNP: BNP (last 3 results) Recent Labs    12/16/18 0934  BNP 321.3*    ProBNP (last 3 results) No results for input(s): PROBNP in the last 8760 hours.  CBG: No results for input(s): GLUCAP in the last 168 hours.     Signed:  Florencia Reasons MD, PhD  [facility] 12/18/2018, 2:21 PM

## 2018-12-18 NOTE — Consult Note (Signed)
[address]       [place] [address]             [phone]        Jasmeen S Vaile [facility] Medical Record #[id number] Date of Birth: 1941/04/28  Referring:Der Meda Coffee Primary Care: Deland Pretty, MD Primary Cardiologist:Jonathan [name], MD  Chief Complaint:    Chief Complaint  Patient presents with  . Chest Pain  . Cough   Patient examined, images of last echocardiogram, cardiac CT and CT scan of chest personally reviewed and counseled with patient.  History of Present Illness:     Very nice 79 year old female reformed smoker with history of COPD and stage II breast cancer treated with right lumpectomy and radiation admitted for chest pain and cough.  She presented feeling that she probably had pneumonia since she has had that in the past.  However chest x-ray was clear and cardiac enzymes negative.  CT of the chest showed no pulmonary embolus, mild dilatation of ascending aorta 3.9 cm without dissection, and subsequent cardiac CT scan showed mild diffuse nonobstructive coronary disease.  The patient has history of known severe aortic insufficiency with fairly well preserved LV function until her most recent echo and April which showed EF had dropped to about 50%.  She is in sinus rhythm.  She has no other valvular disease.  During her hospitalization she was seen by cardiology, Dr. Meda Coffee who has recommended consideration of planning surgical AVR.  Is felt that her chest pain was probably due to hypertension and demand ischemia.  Patient's surgical history is positive for the right lumpectomy.  No history of thoracic trauma, rib fracture or pneumothorax.  The patient had PFTs 3 years ago at the time of the treatment for pneumonia which demonstrates reduced diffusion capacity of 45%.  The patient states she does have home oxygen to be used as needed.  Current Activity/ Functional Status: Patient is active lives with her husband   Zubrod Score: At the time  of surgery this patient's most appropriate activity status/level should be described as: _0     0    Normal activity, no symptoms _1     1    Restricted in physical strenuous activity but ambulatory, able to do out light work _2     2    Ambulatory and capable of self care, unable to do work activities, up and about                 more than 50%  Of the time                            _3     3    Only limited self care, in bed greater than 50% of waking hours _4     4    Completely disabled, no self care, confined to bed or chair _5     5    Moribund  Past Medical History:  Diagnosis Date  . Anemia    "several times over the years" (10/17/2012)  . Anxiety   . Breast cancer ([place]) 02/2010   right breast  . Carcinoma of breast treated with adjuvant hormone therapy ([place]) 9/11   [place]  . CHF (congestive heart failure) ([place])   . COPD (chronic obstructive pulmonary disease) ([place])   . Exertional shortness of breath    "recently" (10/17/2012)  . Hollenhorst plaque, left eye    apparently resolved at  recent ophthalmology visit  . Hyperlipidemia   . 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 Left ventricular dysfunction   . Moderate aortic insufficiency   . Pneumonia    "that's why I'm here" (10/17/2012)    Past Surgical History:  Procedure Laterality Date  . APPENDECTOMY  1974  . BREAST BIOPSY Right 02/27/10   Needle core Biopsy; Invasive Mammary; ER/PR Positive, Her-2 Neu negative, Ki-67 22%  . BREAST LUMPECTOMY WITH SENTINEL LYMPH NODE BIOPSY Right 07/03/11   Invasive Ductal Carcinoma;0/3 nodes negative,; ER,PR Positive, Her-2 Neg; Ki-67 22%  . [place]; 1974  . COLONOSCOPY    . DILATION AND CURETTAGE OF UTERUS  1970's  . TUBAL LIGATION  1974    Social History   Tobacco Use  Smoking Status Former Smoker  . Packs/day: 3.00  . Years: 30.00  . Pack years: 90.00  . Types: Cigarettes  . Start date: 05/19/1958  . Quit date: 06/30/1990  . Years since quitting: 28.4   Smokeless Tobacco Never Used  Tobacco Comment   Stopped smoking for 2-3 years during second child.     Social History   Substance and Sexual Activity  Alcohol Use Yes  . Alcohol/week: 1.0 standard drinks  . Types: 1 Glasses of wine per week   Comment: 10/17/2012 "3-4 glasses of wine/month"     No Known Allergies  Current Facility-Administered Medications  Medication Dose Route Frequency Provider Last Rate Last Dose  . acetaminophen (TYLENOL) tablet 650 mg  650 mg Oral Q4H PRN Darliss Cheney, MD      . aspirin EC tablet 81 mg  81 mg Oral Daily Darliss Cheney, MD   81 mg at 12/18/18 0914  . atorvastatin (LIPITOR) tablet 20 mg  20 mg Oral Daily Darliss Cheney, MD   20 mg at 12/18/18 0914  . carvedilol (COREG) tablet 6.25 mg  6.25 mg Oral BID WC Darliss Cheney, MD   6.25 mg at 12/18/18 0913  . enoxaparin (LOVENOX) injection 40 mg  40 mg Subcutaneous Q24H Darliss Cheney, MD   40 mg at 12/17/18 2139  . hydrALAZINE (APRESOLINE) injection 10 mg  10 mg Intravenous Q6H PRN Darliss Cheney, MD      . Derrill Memo ON 12/19/2018] lisinopril (ZESTRIL) tablet 40 mg  40 mg Oral Daily Dorothy Spark, MD      . nitroGLYCERIN (NITROSTAT) SL tablet 0.4 mg  0.4 mg Sublingual Q5 Min x 3 PRN Darliss Cheney, MD      . ondansetron (ZOFRAN) injection 4 mg  4 mg Intravenous Q6H PRN Darliss Cheney, MD      . sertraline (ZOLOFT) tablet 100 mg  100 mg Oral BID Darliss Cheney, MD   100 mg at 12/18/18 5009    Medications Prior to Admission  Medication Sig Dispense Refill Last Dose  . aspirin EC 81 MG tablet Take 81 mg by mouth daily.   12/16/2018 at Unknown time  . atorvastatin (LIPITOR) 20 MG tablet Take 20 mg by mouth daily.    12/16/2018 at Unknown time  . carvedilol (COREG) 6.25 MG tablet TAKE 1 TABLET(6.25 MG) BY MOUTH TWICE DAILY WITH A MEAL (Patient taking differently: Take 6.25 mg by mouth 2 (two) times daily with a meal. ) 180 tablet 1 12/16/2018 at 0830  . lisinopril (PRINIVIL,ZESTRIL) 5 MG tablet TAKE 1 TABLET BY  MOUTH ONCE DAILY 90 tablet 3 12/16/2018 at Unknown time  . sertraline (ZOLOFT) 100 MG tablet Take 100 mg by mouth 2 (two) times a day.  12/16/2018 at Unknown time  . vitamin B-12 (CYANOCOBALAMIN) 500 MCG tablet Take 500 mcg by mouth daily.   12/16/2018 at Unknown time    Family History  Problem Relation Age of Onset  . Heart disease Mother   . Cancer Father   . Heart disease Maternal Grandmother   . Heart disease Maternal Grandfather   . Cancer Paternal Grandmother   . Lung disease Neg Hx      Review of Systems:   ROS      Cardiac Review of Systems: Y or  [    ]= no  Chest Pain [  y  ]  Resting SOB [   ] Exertional SOB  [ y ]  Orthopnea [  ]   Pedal Edema [   ]    Palpitations [  ] Syncope  [  ]   Presyncope [   ]  General Review of Systems: [Y] = yes [  ]=no Constitional: recent weight change [  ]; anorexia [  ]; fatigue [  ]; nausea [  ]; night sweats [  ]; fever [  ]; or chills [  ]                                                               Dental: Last Dentist visit: 6 months  Eye : blurred vision [  ]; diplopia [   ]; vision changes [  ];  Amaurosis fugax[  ]; Resp: cough [  ];  wheezing[  ];  hemoptysis[  ]; shortness of breath[ y ]; paroxysmal nocturnal dyspnea[  ]; dyspnea on exertion[  ]; or orthopnea[  ];  GI:  gallstones[  ], vomiting[  ];  dysphagia[  ]; melena[  ];  hematochezia [  ]; heartburn[  ];   Hx of  Colonoscopy[  ]; GU: kidney stones [  ]; hematuria[  ];   dysuria [  ];  nocturia[  ];  history of     obstruction [  ]; urinary frequency [  ]             Skin: rash, swelling[  ];, hair loss[  ];  peripheral edema[  ];  or itching[  ]; Musculosketetal: myalgias[  ];  joint swelling[  ];  joint erythema[  ];  joint pain[  ];  back pain[  ];  Heme/Lymph: bruising[  ];  bleeding[  ];  anemia[  ];  Neuro: TIA[  ];  headaches[  ];  stroke[  ];  vertigo[  ];  seizures[  ];   paresthesias[  ];  difficulty walking[  ];  Psych:depression[  ]; anxiety[  ];  Endocrine:  diabetes[  ];  thyroid dysfunction[  ];                history of breast cancer treated followed by Dr. Jana Hakim              Physical Exam: BP (!) 148/47 (BP Location: Left Wrist)   Pulse (!) 57   Temp 98 F (36.7 C) (Oral)   Resp 12   Ht _0  (1.626 m)   Wt 53.2 kg   SpO2 91%   BMI 20.13 kg/m         Exam  General- alert and comfortable.  No distress.    Neck- no JVD, no cervical adenopathy palpable, no carotid bruit.  Dentition excellent condition   Lungs- clear without rales, wheezes   Cor- regular rate and rhythm, no murmur ,3/6 murmur of AI, no gallop   Abdomen- soft, non-tender   Extremities - warm, non-tender, minimal edema   Neuro- oriented, appropriate, no focal weakness   Diagnostic Studies & Laboratory data:     Recent Radiology Findings:   Ct Coronary Morph W/cta Cor W/score W/ca W/cm &/or Wo/cm  Result Date: 12/17/2018 CLINICAL DATA:  78 year old female with severe aortic insufficiency presenting with chest pain. EXAM: Cardiac/Coronary  CT TECHNIQUE: The patient was scanned on a [employer]. FINDINGS: A 120 kV prospective scan was triggered in the descending thoracic aorta at 111 HU's. Axial non-contrast 3 mm slices were carried out through the heart. The data set was analyzed on a dedicated work station and scored using the [place]. Gantry rotation speed was 250 msecs and collimation was .6 mm. No beta blockade and 0.8 mg of sl NTG was given. The 3D data set was reconstructed in 5% intervals of the 67-82 % of the R-R cycle. Diastolic phases were analyzed on a dedicated work station using MPR, MIP and VRT modes. The patient received 80 cc of contrast. Aorta: Upper normal size of the ascending aorta with maximum diameter 40 mm, mild diffuse atherosclerotic plaque and calcifications. No dissection. Aorta: Trileaflet aortic valve with severely thickened and mildly calcified leaflets with central non-coaptation. Sinotubular Junction: 31 x 31 mm  Ascending Thoracic Aorta: 40 x 39 mm Aortic Arch: 29 x 29 mm Descending Thoracic Aorta: 22.7 x 21.9 mm Sinus of Valsalva Measurements: Non-coronary: 30 mm Right -coronary: 30 mm Left -coronary: 33 mm Coronary Artery Height above Annulus: Left Main: 11 mm Right Coronary: 11 mm Virtual Basal Annulus Measurements: Maximum/Minimum Diameter: 25.5 x 20.6 mm Mean Diameter: 22.9 mm Perimeter: 73.2 mm Area: 411 mm2 Coronary Arteries:  Normal coronary origin.  Right dominance. RCA is a very large lumen dominant artery that gives rise to PDA and PLA. There is mild calcified plaque in the proximal to mid RCA with stenosis 25-49%. Left main is a large artery that gives rise to LAD and LCX arteries. Left main has minimal calcified plaque with stenosis 0-25%. LAD is a large vessel that gives rise to a large diagonal branch. There is a mild calcified plaque in the proximal portion with stenosis 25-49%. Mid and distal LAD and 1. diagonal artery have only minimal plaque. LCX is a non-dominant artery that gives rise to two OM branches. There is motion artifact however there appears to be mild mixed plaque in the proximal LCX artery with stenosis 25-49%. Other findings: Normal pulmonary vein drainage into the left atrium. Normal let atrial appendage without a thrombus. IMPRESSION: 1. Trileaflet aortic valve with severely thickened and mildly calcified leaflets with central non-coaptation. 2. Upper normal size of the ascending aorta with maximum diameter 40 mm, mild diffuse atherosclerotic plaque and calcifications. No dissection. 3. Coronary calcium score of 442. This was 58 percentile for age and sex matched control. 4. Normal coronary origin with right dominance. 5. Mild diffuse non-obstructive CAD. 6. Severely dilated pulmonary artery measuring 45 mm consistent with pulmonary hypertension. Electronically Signed   By: Ena Dawley   On: 12/17/2018 16:15   Ct Coronary Fractional Flow Reserve Data Prep  Result Date: 12/18/2018  EXAM: CT FFR ANALYSIS CLINICAL DATA:  78 year old female with chest  pain. FINDINGS: FFRct analysis was performed on the original cardiac CT angiogram dataset. Diagrammatic representation of the FFRct analysis is provided in a separate PDF document in PACS. This dictation was created using the PDF document and an interactive 3D model of the results. 3D model is not available in the EMR/PACS. Normal FFR range is >0.80. 1. Left Main:  No significant stenosis. 2. LAD: No significant stenosis. 3. LCX: No significant stenosis. 4. RCA: No significant stenosis. IMPRESSION: 1.  CT FFR analysis didn't show any significant stenosis. Electronically Signed   By: Ena Dawley   On: 12/18/2018 13:30     I have independently reviewed the above radiologic studies and discussed with the patient   Recent Lab Findings: Lab Results  Component Value Date   WBC 5.3 12/17/2018   HGB 12.3 12/17/2018   HCT 38.9 12/17/2018   PLT 211 12/17/2018   GLUCOSE 109 (H) 12/18/2018   CHOL 140 12/17/2018   TRIG 103 12/17/2018   HDL 38 (L) 12/17/2018   LDLCALC 81 12/17/2018   ALT 15 12/16/2018   AST 17 12/16/2018   NA 140 12/18/2018   K 3.9 12/18/2018   CL 108 12/18/2018   CREATININE 1.01 (H) 12/18/2018   BUN 21 12/18/2018   CO2 26 12/18/2018   TSH 1.477 12/18/2018   INR 1.00 (L) 07/25/2010   HGBA1C 5.7 (H) 02/16/2014      Assessment / Plan:   Severe aortic insufficiency with evidence of early development of LV dysfunction by serial echocardiograms  Hypertension with hypertensive crisis and demand ischemia resulting in hospitalization  COPD with home oxygen, history of smoking, PFTs 2018 with FVC 2.2 and FEV1 1.3 with DLCO only 43% predicted.  History of pneumonia 2 to 3 years ago     The patient has been recommended for aortic valve replacement for severe aortic insufficiency. Her major risk for surgery would be pulmonary due to her COPD.  Her PFTs will need to be repeated prior to surgery and she will  need a right heart cath with evidence of pulmonary hypertension on echo.  She has plans to have a hygienic care from her dentist and will be given a prescription for amoxicillin for valve prophylaxis.  Patient will need to be seen by her cardiologist Dr. [name] before surgery.  I plan on seeing her back after PFTs and her heart cath.  Dr. Quay Burow agrees with plan for surgery this will probably be scheduled for mid-to-late July.  _0 @ 12/18/2018 5:03 PM

## 2018-12-19 ENCOUNTER — Other Ambulatory Visit: Payer: Self-pay

## 2018-12-19 MED ORDER — AMOXICILLIN 500 MG PO TABS: ORAL_TABLET | ORAL | 0 refills | Status: DC

## 2018-12-27 ENCOUNTER — Other Ambulatory Visit: Payer: Self-pay | Admitting: *Deleted

## 2018-12-27 ENCOUNTER — Other Ambulatory Visit (HOSPITAL_COMMUNITY): Payer: Self-pay

## 2018-12-27 ENCOUNTER — Telehealth (HOSPITAL_COMMUNITY): Payer: Self-pay

## 2018-12-27 ENCOUNTER — Other Ambulatory Visit (HOSPITAL_COMMUNITY): Payer: Medicare Other

## 2018-12-27 DIAGNOSIS — I351 Nonrheumatic aortic (valve) insufficiency: Secondary | ICD-10-CM

## 2018-12-27 MED ORDER — SODIUM CHLORIDE 0.9% FLUSH: 3.0000 mL | Freq: Two times a day (BID) | INTRAVENOUS | Status: AC

## 2018-12-27 NOTE — Telephone Encounter (Signed)
Per Dr Aundra Dubin, pt needs [facility].  Left message for patient to call office to schedule L/RHC.

## 2018-12-27 NOTE — Telephone Encounter (Signed)
Spoke with patient, heart cath recommended by MD.  Scheduled for 7/13.  Labs and covid test scheduled for 7/10.  Pt will receive detailed instructions on Friday at lab appt. Pt verbalized understanding and amenable to plan.

## 2018-12-27 NOTE — Progress Notes (Unsigned)
pft  

## 2018-12-30 ENCOUNTER — Other Ambulatory Visit: Payer: Self-pay

## 2018-12-30 ENCOUNTER — Encounter (HOSPITAL_COMMUNITY): Payer: Medicare Other

## 2018-12-30 ENCOUNTER — Other Ambulatory Visit (HOSPITAL_COMMUNITY)
Admission: RE | Admit: 2018-12-30 | Discharge: 2018-12-30 | Disposition: A | Discharge status: Home / Self Care | Payer: Medicare Other | Source: Ambulatory Visit | Attending: Cardiology | Admitting: Cardiology

## 2018-12-30 ENCOUNTER — Telehealth (HOSPITAL_COMMUNITY): Payer: Self-pay

## 2018-12-30 ENCOUNTER — Ambulatory Visit (HOSPITAL_COMMUNITY)
Admission: RE | Admit: 2018-12-30 | Discharge: 2018-12-30 | Disposition: A | Discharge status: Home / Self Care | Payer: Medicare Other | Source: Ambulatory Visit | Attending: Cardiology | Admitting: Cardiology

## 2018-12-30 DIAGNOSIS — I351 Nonrheumatic aortic (valve) insufficiency: Secondary | ICD-10-CM

## 2018-12-30 DIAGNOSIS — Z1159 Encounter for screening for other viral diseases: Secondary | ICD-10-CM | POA: Diagnosis not present

## 2018-12-30 LAB — CBC
HCT: 40.2 % (ref 36.0–46.0)
Hemoglobin: 13.4 g/dL (ref 12.0–15.0)
MCH: 32.1 pg (ref 26.0–34.0)
MCHC: 33.3 g/dL (ref 30.0–36.0)
MCV: 96.2 fL (ref 80.0–100.0)
Platelets: 287 10*3/uL (ref 150–400)
RBC: 4.18 MIL/uL (ref 3.87–5.11)
RDW: 13.2 % (ref 11.5–15.5)
WBC: 6.9 10*3/uL (ref 4.0–10.5)
nRBC: 0 % (ref 0.0–0.2)

## 2018-12-30 LAB — BASIC METABOLIC PANEL
Anion gap: 10 (ref 5–15)
BUN: 30 mg/dL — ABNORMAL HIGH (ref 8–23)
CO2: 22 mmol/L (ref 22–32)
Calcium: 10.4 mg/dL — ABNORMAL HIGH (ref 8.9–10.3)
Chloride: 104 mmol/L (ref 98–111)
Creatinine, Ser: 1.38 mg/dL — ABNORMAL HIGH (ref 0.44–1.00)
GFR calc Af Amer: 43 mL/min — ABNORMAL LOW (ref >60)
GFR calc non Af Amer: 37 mL/min — ABNORMAL LOW (ref >60)
Glucose, Bld: 109 mg/dL — ABNORMAL HIGH (ref 70–99)
Potassium: 4.2 mmol/L (ref 3.5–5.1)
Sodium: 136 mmol/L (ref 135–145)

## 2018-12-30 LAB — PROTIME-INR
INR: 1.1 (ref 0.8–1.2)
Prothrombin Time: 14 seconds (ref 11.4–15.2)

## 2018-12-30 NOTE — Telephone Encounter (Signed)
-----   Message from Larey Dresser, MD sent at 12/30/2018  4:15 PM EDT ----- Hold lisinopril the day before cath and the day of cath with mildly elevated creatinine.  Increase po fluid hydration.

## 2018-12-30 NOTE — Progress Notes (Signed)
Pt arrived for nurse visit. VSS. Obtained pre cath labs. Reviewed pre cath instructions with pt. No questions at this time. Pt on the way to get Covid 19 test and self quarantine.

## 2018-12-30 NOTE — Telephone Encounter (Signed)
Pt aware or lab results and recommendations. Verbalized understanding.

## 2018-12-31 LAB — SARS CORONAVIRUS 2 (TAT 6-24 HRS): SARS Coronavirus 2: NEGATIVE

## 2019-01-02 ENCOUNTER — Other Ambulatory Visit: Payer: Self-pay

## 2019-01-02 ENCOUNTER — Encounter (HOSPITAL_COMMUNITY)
Admission: RE | Disposition: A | Discharge status: Home / Self Care | Payer: Self-pay | Source: Home / Self Care | Attending: Cardiology

## 2019-01-02 ENCOUNTER — Ambulatory Visit (HOSPITAL_COMMUNITY)
Admission: RE | Admit: 2019-01-02 | Discharge: 2019-01-02 | Disposition: A | Discharge status: Home / Self Care | Payer: Medicare Other | Attending: Cardiology | Admitting: Cardiology

## 2019-01-02 ENCOUNTER — Encounter (HOSPITAL_COMMUNITY): Payer: Self-pay | Admitting: Cardiology

## 2019-01-02 DIAGNOSIS — Z79899 Other long term (current) drug therapy: Secondary | ICD-10-CM | POA: Diagnosis not present

## 2019-01-02 DIAGNOSIS — Z853 Personal history of malignant neoplasm of breast: Secondary | ICD-10-CM | POA: Diagnosis not present

## 2019-01-02 DIAGNOSIS — F419 Anxiety disorder, unspecified: Secondary | ICD-10-CM | POA: Diagnosis not present

## 2019-01-02 DIAGNOSIS — J449 Chronic obstructive pulmonary disease, unspecified: Secondary | ICD-10-CM | POA: Insufficient documentation

## 2019-01-02 DIAGNOSIS — I110 Hypertensive heart disease with heart failure: Secondary | ICD-10-CM | POA: Insufficient documentation

## 2019-01-02 DIAGNOSIS — I509 Heart failure, unspecified: Secondary | ICD-10-CM | POA: Insufficient documentation

## 2019-01-02 DIAGNOSIS — I351 Nonrheumatic aortic (valve) insufficiency: Secondary | ICD-10-CM | POA: Diagnosis not present

## 2019-01-02 DIAGNOSIS — R079 Chest pain, unspecified: Secondary | ICD-10-CM | POA: Insufficient documentation

## 2019-01-02 DIAGNOSIS — E785 Hyperlipidemia, unspecified: Secondary | ICD-10-CM | POA: Insufficient documentation

## 2019-01-02 DIAGNOSIS — I2510 Atherosclerotic heart disease of native coronary artery without angina pectoris: Secondary | ICD-10-CM | POA: Diagnosis not present

## 2019-01-02 DIAGNOSIS — Z7982 Long term (current) use of aspirin: Secondary | ICD-10-CM | POA: Diagnosis not present

## 2019-01-02 DIAGNOSIS — Z87891 Personal history of nicotine dependence: Secondary | ICD-10-CM | POA: Insufficient documentation

## 2019-01-02 DIAGNOSIS — Z923 Personal history of irradiation: Secondary | ICD-10-CM | POA: Insufficient documentation

## 2019-01-02 HISTORY — PX: RIGHT/LEFT HEART CATH AND CORONARY ANGIOGRAPHY: CATH118266

## 2019-01-02 LAB — POCT I-STAT EG7
Acid-base deficit: 4 mmol/L — ABNORMAL HIGH (ref 0.0–2.0)
Acid-base deficit: 4 mmol/L — ABNORMAL HIGH (ref 0.0–2.0)
Bicarbonate: 23.2 mmol/L (ref 20.0–28.0)
Bicarbonate: 23.4 mmol/L (ref 20.0–28.0)
Calcium, Ion: 1.37 mmol/L (ref 1.15–1.40)
Calcium, Ion: 1.39 mmol/L (ref 1.15–1.40)
HCT: 34 % — ABNORMAL LOW (ref 36.0–46.0)
HCT: 34 % — ABNORMAL LOW (ref 36.0–46.0)
Hemoglobin: 11.6 g/dL — ABNORMAL LOW (ref 12.0–15.0)
Hemoglobin: 11.6 g/dL — ABNORMAL LOW (ref 12.0–15.0)
O2 Saturation: 67 %
O2 Saturation: 70 %
Potassium: 4.5 mmol/L (ref 3.5–5.1)
Potassium: 4.6 mmol/L (ref 3.5–5.1)
Sodium: 141 mmol/L (ref 135–145)
Sodium: 141 mmol/L (ref 135–145)
TCO2: 25 mmol/L (ref 22–32)
TCO2: 25 mmol/L (ref 22–32)
pCO2, Ven: 49.4 mmHg (ref 44.0–60.0)
pCO2, Ven: 50.5 mmHg (ref 44.0–60.0)
pH, Ven: 7.274 (ref 7.250–7.430)
pH, Ven: 7.28 (ref 7.250–7.430)
pO2, Ven: 40 mmHg (ref 32.0–45.0)
pO2, Ven: 42 mmHg (ref 32.0–45.0)

## 2019-01-02 SURGERY — RIGHT/LEFT HEART CATH AND CORONARY ANGIOGRAPHY
Anesthesia: LOCAL

## 2019-01-02 MED ORDER — LISINOPRIL 40 MG PO TABS: 40.0000 mg | ORAL_TABLET | Freq: Every day | ORAL | 0 refills | Status: DC

## 2019-01-02 MED ORDER — SODIUM CHLORIDE 0.9% FLUSH: 3.0000 mL | INTRAVENOUS | Status: DC | PRN

## 2019-01-02 MED ORDER — SODIUM CHLORIDE 0.9 % IV SOLN: 250.0000 mL | INTRAVENOUS | Status: DC | PRN

## 2019-01-02 MED ORDER — HEPARIN SODIUM (PORCINE) 1000 UNIT/ML IJ SOLN
INTRAMUSCULAR | Status: DC | PRN
  Administered 2019-01-02: 2500 [IU] via INTRAVENOUS

## 2019-01-02 MED ORDER — HEPARIN (PORCINE) IN NACL 1000-0.9 UT/500ML-% IV SOLN
INTRAVENOUS | Status: AC
  Filled 2019-01-02: qty 1000

## 2019-01-02 MED ORDER — VERAPAMIL HCL 2.5 MG/ML IV SOLN
INTRAVENOUS | Status: AC
  Filled 2019-01-02: qty 2

## 2019-01-02 MED ORDER — HYDRALAZINE HCL 20 MG/ML IJ SOLN: 10.0000 mg | INTRAMUSCULAR | Status: DC | PRN

## 2019-01-02 MED ORDER — VERAPAMIL HCL 2.5 MG/ML IV SOLN
INTRAVENOUS | Status: DC | PRN
  Administered 2019-01-02: 10:00:00 10 mL via INTRA_ARTERIAL

## 2019-01-02 MED ORDER — FENTANYL CITRATE (PF) 100 MCG/2ML IJ SOLN
INTRAMUSCULAR | Status: AC
  Filled 2019-01-02: qty 2

## 2019-01-02 MED ORDER — LIDOCAINE HCL (PF) 1 % IJ SOLN
INTRAMUSCULAR | Status: AC
  Filled 2019-01-02: qty 30

## 2019-01-02 MED ORDER — LABETALOL HCL 5 MG/ML IV SOLN: 10.0000 mg | INTRAVENOUS | Status: DC | PRN

## 2019-01-02 MED ORDER — ASPIRIN 81 MG PO CHEW: 81.0000 mg | CHEWABLE_TABLET | ORAL | Status: DC

## 2019-01-02 MED ORDER — MIDAZOLAM HCL 2 MG/2ML IJ SOLN
INTRAMUSCULAR | Status: DC | PRN
  Administered 2019-01-02: 1 mg via INTRAVENOUS

## 2019-01-02 MED ORDER — SODIUM CHLORIDE 0.9 % IV SOLN: INTRAVENOUS | Status: DC

## 2019-01-02 MED ORDER — SODIUM CHLORIDE 0.9 % IV SOLN
INTRAVENOUS | Status: DC
  Administered 2019-01-02: 08:00:00 via INTRAVENOUS

## 2019-01-02 MED ORDER — ONDANSETRON HCL 4 MG/2ML IJ SOLN: 4.0000 mg | Freq: Four times a day (QID) | INTRAMUSCULAR | Status: DC | PRN

## 2019-01-02 MED ORDER — HEPARIN (PORCINE) IN NACL 1000-0.9 UT/500ML-% IV SOLN
INTRAVENOUS | Status: DC | PRN
  Administered 2019-01-02 (×2): 500 mL

## 2019-01-02 MED ORDER — IOHEXOL 350 MG/ML SOLN
INTRAVENOUS | Status: DC | PRN
  Administered 2019-01-02: 30 mL via INTRAVENOUS

## 2019-01-02 MED ORDER — LIDOCAINE HCL (PF) 1 % IJ SOLN
INTRAMUSCULAR | Status: DC | PRN
  Administered 2019-01-02 (×2): 2 mL via SUBCUTANEOUS

## 2019-01-02 MED ORDER — FENTANYL CITRATE (PF) 100 MCG/2ML IJ SOLN
INTRAMUSCULAR | Status: DC | PRN
  Administered 2019-01-02: 25 ug via INTRAVENOUS

## 2019-01-02 MED ORDER — SODIUM CHLORIDE 0.9% FLUSH: 3.0000 mL | Freq: Two times a day (BID) | INTRAVENOUS | Status: DC

## 2019-01-02 MED ORDER — ACETAMINOPHEN 325 MG PO TABS: 650.0000 mg | ORAL_TABLET | ORAL | Status: DC | PRN

## 2019-01-02 MED ORDER — MIDAZOLAM HCL 2 MG/2ML IJ SOLN
INTRAMUSCULAR | Status: AC
  Filled 2019-01-02: qty 2

## 2019-01-02 SURGICAL SUPPLY — 12 items
CATH 5FR JL3.5 JR4 ANG PIG MP (CATHETERS) ×1 IMPLANT
CATH BALLN WEDGE 5F 110CM (CATHETERS) ×1 IMPLANT
DEVICE RAD TR BAND REGULAR (VASCULAR PRODUCTS) ×1 IMPLANT
GLIDESHEATH SLEND SS 6F .021 (SHEATH) ×1 IMPLANT
GUIDEWIRE .025 260CM (WIRE) ×1 IMPLANT
GUIDEWIRE INQWIRE 1.5J.035X260 (WIRE) IMPLANT
INQWIRE 1.5J .035X260CM (WIRE) ×2
KIT HEART LEFT (KITS) ×2 IMPLANT
PACK CARDIAC CATHETERIZATION (CUSTOM PROCEDURE TRAY) ×2 IMPLANT
SHEATH GLIDE SLENDER 4/5FR (SHEATH) ×1 IMPLANT
TUBING CIL FLEX 10 FLL-RA (TUBING) ×2 IMPLANT
WIRE HI TORQ VERSACORE-J 145CM (WIRE) ×1 IMPLANT

## 2019-01-02 NOTE — Interval H&P Note (Signed)
History and Physical Interval Note:  01/02/2019 9:12 AM  Dana Pruitt  has presented today for surgery, with the diagnosis of heart failure.  The various methods of treatment have been discussed with the patient and family. After consideration of risks, benefits and other options for treatment, the patient has consented to  Procedure(s): RIGHT/LEFT HEART CATH AND CORONARY ANGIOGRAPHY (N/A) as a surgical intervention.  The patient's history has been reviewed, patient examined, no change in status, stable for surgery.  I have reviewed the patient's chart and labs.  Questions were answered to the patient's satisfaction.     Magan Winnett [employer]

## 2019-01-02 NOTE — Discharge Instructions (Signed)
Do not take lisinopril again until Wednesday.   Drink fluids to keep urine clear next couple days. Keep right wrist elevated at heart level for 24 hours  Radial Site Care  This sheet gives you information about how to care for yourself after your procedure. Your health care provider may also give you more specific instructions. If you have problems or questions, contact your health care provider. What can I expect after the procedure? After the procedure, it is common to have:  Bruising and tenderness at the catheter insertion area. Follow these instructions at home: Medicines  Take over-the-counter and prescription medicines only as told by your health care provider. Insertion site care  Follow instructions from your health care provider about how to take care of your insertion site. Make sure you: ? Wash your hands with soap and water before you change your bandage (dressing). If soap and water are not available, use hand sanitizer. ? Remove your dressing as told by your health care provider. In 24-48 hours ? May remove brown Coban wrap off elbow tonight (7/13)   Check your insertion site every day for signs of infection. Check for: ? Redness, swelling, or pain. ? Fluid or blood. ? Pus or a bad smell. ? Warmth.  Do not take baths, swim, or use a hot tub until your health care provider approves.  You may shower 24-48 hours after the procedure, or as directed by your health care provider. ? Remove the dressing and gently wash the site with plain soap and water. ? Pat the area dry with a clean towel. ? Do not rub the site. That could cause bleeding.  Do not apply powder or lotion to the site. Activity   For 24 hours after the procedure, or as directed by your health care provider: ? Do not flex or bend the affected arm. ? Do not push or pull heavy objects with the affected arm. ? Do not drive yourself home from the hospital or clinic. You may drive 24 hours after the  procedure unless your health care provider tells you not to. ? Do not operate machinery or power tools.  Do not lift anything that is heavier than 10 lb (4.5 kg), or the limit that you are told, until your health care provider says that it is safe. For 4 days  Ask your health care provider when it is okay to: ? Return to work or school. ? Resume usual physical activities or sports. ? Resume sexual activity. General instructions  If the catheter site starts to bleed, raise your arm and put firm pressure on the site. If the bleeding does not stop, get help right away. This is a medical emergency.  If you went home on the same day as your procedure, a responsible adult should be with you for the first 24 hours after you arrive home.  Keep all follow-up visits as told by your health care provider. This is important. Contact a health care provider if:  You have a fever.  You have redness, swelling, or yellow drainage around your insertion site. Get help right away if:  You have unusual pain at the radial site.  The catheter insertion area swells very fast.  The insertion area is bleeding, and the bleeding does not stop when you hold steady pressure on the area.  Your arm or hand becomes pale, cool, tingly, or numb. These symptoms may represent a serious problem that is an emergency. Do not wait to see if the  symptoms will go away. Get medical help right away. Call your local emergency services (911 in the [place] Do not drive yourself to the hospital. Summary  After the procedure, it is common to have bruising and tenderness at the site.  Follow instructions from your health care provider about how to take care of your radial site wound. Check the wound every day for signs of infection.  Do not lift anything that is heavier than 10 lb (4.5 kg), or the limit that you are told, until your health care provider says that it is safe. This information is not intended to replace advice given  to you by your health care provider. Make sure you discuss any questions you have with your health care provider. Document Released: 07/11/2010 Document Revised: 07/14/2017 Document Reviewed: 07/14/2017 Elsevier Patient Education  2020 [employer].

## 2019-01-02 NOTE — Research (Signed)
[place] Informed Consent   Subject Name: GURPREET MARIANI  Subject met inclusion and exclusion criteria.  The informed consent form, study requirements and expectations were reviewed with the subject and questions and concerns were addressed prior to the signing of the consent form.  The subject verbalized understanding of the trial requirements.  The subject agreed to participate in the [facility] trial and signed the informed consent at [place] on 02-Jan-2019.  The informed consent was obtained prior to performance of any protocol-specific procedures for the subject.  A copy of the signed informed consent was given to the subject and a copy was placed in the subject's medical record.   Fritzie Prioleau

## 2019-01-03 ENCOUNTER — Other Ambulatory Visit: Payer: Self-pay | Admitting: Cardiology

## 2019-01-05 ENCOUNTER — Ambulatory Visit (HOSPITAL_COMMUNITY)
Admission: RE | Admit: 2019-01-05 | Discharge: 2019-01-05 | Disposition: A | Discharge status: Home / Self Care | Payer: Medicare Other | Source: Ambulatory Visit | Attending: Cardiothoracic Surgery | Admitting: Cardiothoracic Surgery

## 2019-01-05 ENCOUNTER — Other Ambulatory Visit: Payer: Self-pay

## 2019-01-05 DIAGNOSIS — J988 Other specified respiratory disorders: Secondary | ICD-10-CM | POA: Diagnosis not present

## 2019-01-05 DIAGNOSIS — I351 Nonrheumatic aortic (valve) insufficiency: Secondary | ICD-10-CM | POA: Insufficient documentation

## 2019-01-05 LAB — PULMONARY FUNCTION TEST
DL/VA % pred: 52 %
DL/VA: 2.15 ml/min/mmHg/L
DLCO cor % pred: 39 %
DLCO cor: 7.27 ml/min/mmHg
DLCO unc % pred: 39 %
DLCO unc: 7.27 ml/min/mmHg
FEF 25-75 Post: 0.57 L/sec
FEF 25-75 Pre: 0.44 L/sec
FEF2575-%Change-Post: 31 %
FEF2575-%Pred-Post: 36 %
FEF2575-%Pred-Pre: 27 %
FEV1-%Change-Post: 6 %
FEV1-%Pred-Post: 64 %
FEV1-%Pred-Pre: 60 %
FEV1-Post: 1.3 L
FEV1-Pre: 1.22 L
FEV1FVC-%Change-Post: 8 %
FEV1FVC-%Pred-Pre: 76 %
FEV6-%Change-Post: 3 %
FEV6-%Pred-Post: 80 %
FEV6-%Pred-Pre: 77 %
FEV6-Post: 2.04 L
FEV6-Pre: 1.98 L
FEV6FVC-%Change-Post: 4 %
FEV6FVC-%Pred-Post: 102 %
FEV6FVC-%Pred-Pre: 97 %
FVC-%Change-Post: -1 %
FVC-%Pred-Post: 78 %
FVC-%Pred-Pre: 79 %
FVC-Post: 2.1 L
FVC-Pre: 2.13 L
Post FEV1/FVC ratio: 62 %
Post FEV6/FVC ratio: 97 %
Pre FEV1/FVC ratio: 57 %
Pre FEV6/FVC Ratio: 93 %
RV % pred: 88 %
RV: 2 L
TLC % pred: 86 %
TLC: 4.29 L

## 2019-01-05 MED ORDER — ALBUTEROL SULFATE (2.5 MG/3ML) 0.083% IN NEBU
2.5000 mg | INHALATION_SOLUTION | Freq: Once | RESPIRATORY_TRACT | Status: AC
  Administered 2019-01-05: 10:00:00 2.5 mg via RESPIRATORY_TRACT

## 2019-01-10 ENCOUNTER — Other Ambulatory Visit: Payer: Self-pay

## 2019-01-11 ENCOUNTER — Institutional Professional Consult (permissible substitution) (INDEPENDENT_AMBULATORY_CARE_PROVIDER_SITE_OTHER): Payer: Medicare Other | Admitting: Cardiothoracic Surgery

## 2019-01-11 ENCOUNTER — Encounter: Payer: Self-pay | Admitting: Cardiothoracic Surgery

## 2019-01-11 VITALS — BP 112/60 | HR 78 | Temp 97.5°F | Resp 16 | Ht 64.0 in | Wt 118.0 lb

## 2019-01-11 DIAGNOSIS — I351 Nonrheumatic aortic (valve) insufficiency: Secondary | ICD-10-CM

## 2019-01-11 DIAGNOSIS — I061 Rheumatic aortic insufficiency: Secondary | ICD-10-CM

## 2019-01-11 NOTE — Progress Notes (Signed)
PCP is Deland Pretty, MD Referring Provider is Dorothy Spark, MD  Chief Complaint  Patient presents with  . Aortic Insuffiency    Follow up after hospital consult .Marland KitchenMarland KitchenSurgical eval, Cardiac Cath 01/02/19, ECHO 10/11/18, PFT's 01/05/19    HPI: Patient presents after right and left heart cath and PFTs for discussion of aortic valve replacement for recent progression of aortic insufficiency.  She has been followed by 7 by her cardiologist for several years with serial echocardiograms.  Her most recent echo showed severe AI with some LV dilatation and reduction in LV systolic function.  Her coronaries are clean.  Right heart cath showed normal pressures with mixed venous saturation of 68%.  LVEDP is normal, cardiac index is 2.9, LV systolic pressure 027, systemic diastolic pressure 39.  PFTs show FEV1 1.2 [60%] and DLCO 40%- remote history of heavy smoking.  CT scan of the chest shows ascending aorta measures 4.0 cm.  There is panlobular emphysema noted on the lung windows of her cardiac CT scan.   Past Medical History:  Diagnosis Date  . Anemia    "several times over the years" (10/17/2012)  . Anxiety   . Breast cancer ([place]) 02/2010   right breast  . Carcinoma of breast treated with adjuvant hormone therapy ([place]) 9/11   [place]  . CHF (congestive heart failure) ([place])   . COPD (chronic obstructive pulmonary disease) ([place])   . Exertional shortness of breath    "recently" (10/17/2012)  . Hollenhorst plaque, left eye    apparently resolved at recent ophthalmology visit  . Hyperlipidemia   . 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 Left ventricular dysfunction   . Moderate aortic insufficiency   . Pneumonia    "that's why I'm here" (10/17/2012)    Past Surgical History:  Procedure Laterality Date  . APPENDECTOMY  1974  . BREAST BIOPSY Right 02/27/10   Needle core Biopsy; Invasive Mammary; ER/PR Positive, Her-2 Neu negative, Ki-67 22%  . BREAST LUMPECTOMY WITH SENTINEL LYMPH NODE  BIOPSY Right 07/03/11   Invasive Ductal Carcinoma;0/3 nodes negative,; ER,PR Positive, Her-2 Neg; Ki-67 22%  . [place]; 1974  . COLONOSCOPY    . DILATION AND CURETTAGE OF UTERUS  1970's  . RIGHT/LEFT HEART CATH AND CORONARY ANGIOGRAPHY N/A 01/02/2019   Procedure: RIGHT/LEFT HEART CATH AND CORONARY ANGIOGRAPHY;  Surgeon: Larey Dresser, MD;  Location: [facility];  Service: Cardiovascular;  Laterality: N/A;  . TUBAL LIGATION  1974    Family History  Problem Relation Age of Onset  . Heart disease Mother   . Cancer Father   . Heart disease Maternal Grandmother   . Heart disease Maternal Grandfather   . Cancer Paternal Grandmother   . Lung disease Neg Hx     Social History Social History   Tobacco Use  . Smoking status: Former Smoker    Packs/day: 3.00    Years: 30.00    Pack years: 90.00    Types: Cigarettes    Start date: 05/19/1958    Quit date: 06/30/1990    Years since quitting: 28.5  . Smokeless tobacco: Never Used  . Tobacco comment: Stopped smoking for 2-3 years during second child.   Substance Use Topics  . Alcohol use: Yes    Alcohol/week: 1.0 standard drinks    Types: 1 Glasses of wine per week    Comment: 10/17/2012 "3-4 glasses of wine/month"  . Drug use: No    Current Outpatient Medications  Medication Sig  Dispense Refill  . amoxicillin (AMOXIL) 500 MG tablet 4 tablets to be taken 1 hour prior to dental examination. 4 tablet 0  . aspirin EC 81 MG tablet Take 81 mg by mouth daily.    [name] atorvastatin (LIPITOR) 20 MG tablet Take 20 mg by mouth daily.     . carvedilol (COREG) 6.25 MG tablet TAKE 1 TABLET(6.25 MG) BY MOUTH TWICE DAILY WITH A MEAL (Patient taking differently: Take 6.25 mg by mouth 2 (two) times daily with a meal. ) 180 tablet 1  . lisinopril (ZESTRIL) 40 MG tablet TAKE 1 TABLET(40 MG) BY MOUTH DAILY 90 tablet 1  . sertraline (ZOLOFT) 100 MG tablet Take 100 mg by mouth 2 (two) times a day.     . vitamin B-12 (CYANOCOBALAMIN) 500  MCG tablet Take 500 mcg by mouth daily.     No current facility-administered medications for this visit.    Facility-Administered Medications Ordered in Other Visits  Medication Dose Route Frequency Provider Last Rate Last Dose  . sodium chloride flush (NS) 0.9 % injection 3 mL  3 mL Intravenous Q12H Larey Dresser, MD        No Known Allergies  Review of Systems  No problems with general anesthesia No bleeding diathesis No history of thoracic trauma or pneumothorax No history of edema syncope palpitations or presyncope  BP 112/60 (BP Location: Right Arm, Patient Position: Sitting, Cuff Size: Normal)   Pulse 78   Temp (!) 97.5 F (36.4 C) Comment: thermal  Resp 16   Ht 5' 4"  (1.626 m)   Wt 118 lb (53.5 kg)   SpO2 93% Comment: RA  BMI 20.25 kg/m  Physical Exam      Exam    General- alert and comfortable    Neck- no JVD, no cervical adenopathy palpable, no carotid bruit   Lungs- clear without rales, wheezes   Cor- regular rate and rhythm, 2/6 AI murmur , no gallop   Abdomen- soft, non-tender   Extremities - warm, non-tender, minimal edema   Neuro- oriented, appropriate, no focal weakness   Diagnostic Tests: Images of cardiac CT and coronary angiograms personally reviewed and discussed with patient  Impression: Severe AI with early dilatation of LV and the decreased systolic function.  Agree with recommendation for AVR by patient cardiologist  Plan: Patient will need to have her dentist clean her teeth, check her teeth before scheduling her for elective AVR with a bioprosthetic valve.  The details of the procedure including the use of general anesthesia cardiopulmonary bypass and the location of the surgical incision were all discussed with the patient.  She understands the risks and benefits as well. She will let us know after her teeth are cleaned when we can schedule the surgery.  Len Childs, MD [facility] [phone]

## 2019-01-12 DIAGNOSIS — Z0120 Encounter for dental examination and cleaning without abnormal findings: Secondary | ICD-10-CM | POA: Diagnosis not present

## 2019-01-12 DIAGNOSIS — E7800 Pure hypercholesterolemia, unspecified: Secondary | ICD-10-CM | POA: Diagnosis not present

## 2019-01-13 ENCOUNTER — Other Ambulatory Visit: Payer: Self-pay | Admitting: Cardiothoracic Surgery

## 2019-01-17 ENCOUNTER — Other Ambulatory Visit: Payer: Self-pay | Admitting: *Deleted

## 2019-01-17 ENCOUNTER — Encounter: Payer: Self-pay | Admitting: *Deleted

## 2019-01-17 DIAGNOSIS — I351 Nonrheumatic aortic (valve) insufficiency: Secondary | ICD-10-CM

## 2019-01-19 DIAGNOSIS — Z0000 Encounter for general adult medical examination without abnormal findings: Secondary | ICD-10-CM | POA: Diagnosis not present

## 2019-01-19 DIAGNOSIS — J449 Chronic obstructive pulmonary disease, unspecified: Secondary | ICD-10-CM | POA: Diagnosis not present

## 2019-01-19 DIAGNOSIS — I10 Essential (primary) hypertension: Secondary | ICD-10-CM | POA: Diagnosis not present

## 2019-01-19 DIAGNOSIS — E7800 Pure hypercholesterolemia, unspecified: Secondary | ICD-10-CM | POA: Diagnosis not present

## 2019-01-26 NOTE — Pre-Procedure Instructions (Signed)
Dana Pruitt  01/26/2019      [place], [place] - [address] [place] [address] Phone: [phone] Fax: [phone]  [facility] [phone] - [place], [place] [address] [place] [address] Phone: [phone] Fax: [phone]    Your procedure is scheduled on January 31, 2019.  Report to [facility] Admitting at 530 AM.  Call this number if you have problems the morning of surgery:  [phone]  Call [phone] if you have any questions prior to your surgery date Monday-Friday 8am-4pm    Remember:  Do not eat or drink after midnight.   Take these medicines the morning of surgery with A SIP OF WATER  Carvedilol (coreg) Sertraline (zoloft)  Follow your surgeon's instructions on when to hold/resume aspirin.  If no instructions were given call the office to determine how they would like to you take aspirin   7 days prior to surgery STOP taking any Aleve, Naproxen, Ibuprofen, Motrin, Advil, Goody's, BC's, all herbal medications, fish oil, and all vitamins   Day of surgery:  Do not wear jewelry, make-up or nail polish.  Do not wear lotions, powders, or perfumes, or deodorant.  Do not shave 48 hours prior to surgery.    Do not bring valuables to the hospital.   [facility] is not responsible for any belongings or valuables.  IF you are a smoker, DO NOT Smoke 24 hours prior to surgery   IF you wear a CPAP at night please bring your mask, tubing, and machine the morning of surgery    Remember that you must have someone to transport you home after your surgery, and remain with you for 24 hours if you are discharged the same day.  Contacts, dentures or bridgework may not be worn into surgery.  Leave your suitcase in the car.  After surgery it may be brought to your room.  For patients admitted to the hospital, discharge time will be  determined by your treatment team.  Patients discharged the day of surgery will not be allowed to drive home.    [facility]- Preparing For Surgery  Before surgery, you can play an important role. Because skin is not sterile, your skin needs to be as free of germs as possible. You can reduce the number of germs on your skin by washing with CHG (chlorahexidine gluconate) Soap before surgery.  CHG is an antiseptic cleaner which kills germs and bonds with the skin to continue killing germs even after washing.    Oral Hygiene is also important to reduce your risk of infection.  Remember - BRUSH YOUR TEETH THE MORNING OF SURGERY WITH YOUR REGULAR TOOTHPASTE  Please do not use if you have an allergy to CHG or antibacterial soaps. If your skin becomes reddened/irritated stop using the CHG.  Do not shave (including legs and underarms) for at least 48 hours prior to first CHG shower. It is OK to shave your face.  Please follow these instructions carefully.   1. Shower the NIGHT BEFORE SURGERY and the MORNING OF SURGERY with CHG.   2. If you chose to wash your hair, wash your hair first as usual with your normal shampoo.  3. After you shampoo, rinse your hair and body thoroughly to remove the shampoo.  4. Use CHG as you would any other liquid soap. You can apply CHG directly to the skin and  wash gently with a scrungie or a clean washcloth.   5. Apply the CHG Soap to your body ONLY FROM THE NECK DOWN.  Do not use on open wounds or open sores. Avoid contact with your eyes, ears, mouth and genitals (private parts). Wash Face and genitals (private parts)  with your normal soap.  6. Wash thoroughly, paying special attention to the area where your surgery will be performed.  7. Thoroughly rinse your body with warm water from the neck down.  8. DO NOT shower/wash with your normal soap after using and rinsing off the CHG Soap.  9. Pat yourself dry with a CLEAN TOWEL.  10. Wear CLEAN PAJAMAS to bed  the night before surgery, wear comfortable clothes the morning of surgery  11. Place CLEAN SHEETS on your bed the night of your first shower and DO NOT SLEEP WITH PETS.  Day of Surgery: Shower as above Do not apply any deodorants/lotions.  Please wear clean clothes to the hospital/surgery center.   Remember to brush your teeth WITH YOUR REGULAR TOOTHPASTE.  Please read over the following fact sheets that you were given.

## 2019-01-27 ENCOUNTER — Other Ambulatory Visit (HOSPITAL_COMMUNITY)
Admission: RE | Admit: 2019-01-27 | Discharge: 2019-01-27 | Disposition: A | Discharge status: Home / Self Care | Payer: Medicare Other | Source: Ambulatory Visit | Attending: Cardiothoracic Surgery | Admitting: Cardiothoracic Surgery

## 2019-01-27 ENCOUNTER — Encounter (HOSPITAL_COMMUNITY): Payer: Self-pay

## 2019-01-27 ENCOUNTER — Ambulatory Visit (HOSPITAL_COMMUNITY)
Admission: RE | Admit: 2019-01-27 | Discharge: 2019-01-27 | Disposition: A | Discharge status: Home / Self Care | Payer: Medicare Other | Source: Ambulatory Visit | Attending: Cardiothoracic Surgery | Admitting: Cardiothoracic Surgery

## 2019-01-27 ENCOUNTER — Other Ambulatory Visit: Payer: Self-pay

## 2019-01-27 ENCOUNTER — Encounter (HOSPITAL_COMMUNITY)
Admission: RE | Admit: 2019-01-27 | Discharge: 2019-01-27 | Disposition: A | Discharge status: Home / Self Care | Payer: Medicare Other | Source: Ambulatory Visit | Attending: Cardiothoracic Surgery | Admitting: Cardiothoracic Surgery

## 2019-01-27 DIAGNOSIS — I351 Nonrheumatic aortic (valve) insufficiency: Secondary | ICD-10-CM

## 2019-01-27 DIAGNOSIS — I6523 Occlusion and stenosis of bilateral carotid arteries: Secondary | ICD-10-CM | POA: Insufficient documentation

## 2019-01-27 DIAGNOSIS — Z20828 Contact with and (suspected) exposure to other viral communicable diseases: Secondary | ICD-10-CM | POA: Diagnosis not present

## 2019-01-27 DIAGNOSIS — Z01818 Encounter for other preprocedural examination: Secondary | ICD-10-CM | POA: Insufficient documentation

## 2019-01-27 HISTORY — DX: Essential (primary) hypertension: I10

## 2019-01-27 HISTORY — DX: Gastro-esophageal reflux disease without esophagitis: K21.9

## 2019-01-27 LAB — PROTIME-INR
INR: 1 (ref 0.8–1.2)
Prothrombin Time: 13.1 seconds (ref 11.4–15.2)

## 2019-01-27 LAB — URINALYSIS, ROUTINE W REFLEX MICROSCOPIC
Bilirubin Urine: NEGATIVE
Glucose, UA: NEGATIVE mg/dL
Hgb urine dipstick: NEGATIVE
Ketones, ur: NEGATIVE mg/dL
Leukocytes,Ua: NEGATIVE
Nitrite: NEGATIVE
Protein, ur: NEGATIVE mg/dL
Specific Gravity, Urine: 1.01 (ref 1.005–1.030)
pH: 5 (ref 5.0–8.0)

## 2019-01-27 LAB — COMPREHENSIVE METABOLIC PANEL
ALT: 16 U/L (ref 0–44)
AST: 18 U/L (ref 15–41)
Albumin: 3.9 g/dL (ref 3.5–5.0)
Alkaline Phosphatase: 42 U/L (ref 38–126)
Anion gap: 10 (ref 5–15)
BUN: 19 mg/dL (ref 8–23)
CO2: 26 mmol/L (ref 22–32)
Calcium: 10.6 mg/dL — ABNORMAL HIGH (ref 8.9–10.3)
Chloride: 102 mmol/L (ref 98–111)
Creatinine, Ser: 1.13 mg/dL — ABNORMAL HIGH (ref 0.44–1.00)
GFR calc Af Amer: 55 mL/min — ABNORMAL LOW (ref >60)
GFR calc non Af Amer: 47 mL/min — ABNORMAL LOW (ref >60)
Glucose, Bld: 100 mg/dL — ABNORMAL HIGH (ref 70–99)
Potassium: 3.9 mmol/L (ref 3.5–5.1)
Sodium: 138 mmol/L (ref 135–145)
Total Bilirubin: 0.8 mg/dL (ref 0.3–1.2)
Total Protein: 6.6 g/dL (ref 6.5–8.1)

## 2019-01-27 LAB — CBC
HCT: 35.7 % — ABNORMAL LOW (ref 36.0–46.0)
Hemoglobin: 12.1 g/dL (ref 12.0–15.0)
MCH: 32.1 pg (ref 26.0–34.0)
MCHC: 33.9 g/dL (ref 30.0–36.0)
MCV: 94.7 fL (ref 80.0–100.0)
Platelets: 216 10*3/uL (ref 150–400)
RBC: 3.77 MIL/uL — ABNORMAL LOW (ref 3.87–5.11)
RDW: 13.2 % (ref 11.5–15.5)
WBC: 5 10*3/uL (ref 4.0–10.5)
nRBC: 0 % (ref 0.0–0.2)

## 2019-01-27 LAB — BLOOD GAS, ARTERIAL
Acid-Base Excess: 0.5 mmol/L (ref 0.0–2.0)
Bicarbonate: 24.5 mmol/L (ref 20.0–28.0)
Drawn by: 42180
FIO2: 21
O2 Saturation: 96.1 %
Patient temperature: 98.6
pCO2 arterial: 38.2 mmHg (ref 32.0–48.0)
pH, Arterial: 7.423 (ref 7.350–7.450)
pO2, Arterial: 83.6 mmHg (ref 83.0–108.0)

## 2019-01-27 LAB — SARS CORONAVIRUS 2 (TAT 6-24 HRS): SARS Coronavirus 2: NEGATIVE

## 2019-01-27 LAB — HEMOGLOBIN A1C
Hgb A1c MFr Bld: 5.8 % — ABNORMAL HIGH (ref 4.8–5.6)
Mean Plasma Glucose: 119.76 mg/dL

## 2019-01-27 LAB — SURGICAL PCR SCREEN
MRSA, PCR: NEGATIVE
Staphylococcus aureus: NEGATIVE

## 2019-01-27 LAB — ABO/RH: ABO/RH(D): A POS

## 2019-01-27 LAB — APTT: aPTT: 32 seconds (ref 24–36)

## 2019-01-27 NOTE — Progress Notes (Signed)
Patient denies shortness of breath, fever, cough and chest pain at PAT appointment  PCP - Dr Deland Pretty Cardiologist - Dr Quay Bu[facility]   Chest x-ray - 01/27/19 EKG - 01/27/19 Stress Test - 05/2014 ECHO - 10/04/18 Cardiac Cath - 01/02/19  Aspirin Instructions: Per MD, continue aspirin but do not take on DOS per patient.  Anesthesia review: Yes  7 days prior to surgery STOP taking any Aspirin (unless otherwise instructed by your surgeon), Aleve, Naproxen, Ibuprofen, Motrin, Advil, Goody's, BC's, all herbal medications, fish oil, and all vitamins.   Coronavirus Screening Have you or your husband experienced the following symptoms:  Cough yes/no: No Fever (>100.49F)  yes/no: No Runny nose yes/no: No Sore throat yes/no: No Difficulty breathing/shortness of breath  yes/no: No  Have you or your husband traveled in the last 14 days and where? yes/no: No

## 2019-01-27 NOTE — Progress Notes (Signed)
Vascular ultrasound Pre AVR       has been completed. Preliminary results can be found under CV proc through chart review. June Leap, BS, RDMS, RVT

## 2019-01-30 MED ORDER — VANCOMYCIN HCL 10 G IV SOLR
1250.0000 mg | INTRAVENOUS | Status: AC
  Administered 2019-01-31: 1250 mg via INTRAVENOUS
  Filled 2019-01-30: qty 1250

## 2019-01-30 MED ORDER — PHENYLEPHRINE HCL-NACL 20-0.9 MG/250ML-% IV SOLN
30.0000 ug/min | INTRAVENOUS | Status: DC
  Filled 2019-01-30: qty 250

## 2019-01-30 MED ORDER — MILRINONE LACTATE IN DEXTROSE 20-5 MG/100ML-% IV SOLN
0.3000 ug/kg/min | INTRAVENOUS | Status: DC
  Filled 2019-01-30: qty 100

## 2019-01-30 MED ORDER — TRANEXAMIC ACID 1000 MG/10ML IV SOLN
1.5000 mg/kg/h | INTRAVENOUS | Status: AC
  Administered 2019-01-31: 15 mg/kg/h via INTRAVENOUS
  Filled 2019-01-30: qty 25

## 2019-01-30 MED ORDER — TRANEXAMIC ACID (OHS) BOLUS VIA INFUSION
15.0000 mg/kg | INTRAVENOUS | Status: DC
  Filled 2019-01-30: qty 825

## 2019-01-30 MED ORDER — DOPAMINE-DEXTROSE 3.2-5 MG/ML-% IV SOLN
0.0000 ug/kg/min | INTRAVENOUS | Status: DC
  Filled 2019-01-30: qty 250

## 2019-01-30 MED ORDER — SODIUM CHLORIDE 0.9 % IV SOLN
INTRAVENOUS | Status: DC
  Filled 2019-01-30: qty 30

## 2019-01-30 MED ORDER — INSULIN REGULAR(HUMAN) IN NACL 100-0.9 UT/100ML-% IV SOLN
INTRAVENOUS | Status: AC
  Administered 2019-01-31: .8 [IU]/h via INTRAVENOUS
  Filled 2019-01-30: qty 100

## 2019-01-30 MED ORDER — SODIUM CHLORIDE 0.9 % IV SOLN
750.0000 mg | INTRAVENOUS | Status: DC
  Filled 2019-01-30: qty 750

## 2019-01-30 MED ORDER — PLASMA-LYTE 148 IV SOLN
INTRAVENOUS | Status: DC
  Filled 2019-01-30: qty 2.5

## 2019-01-30 MED ORDER — NOREPINEPHRINE 4 MG/250ML-% IV SOLN
0.0000 ug/min | INTRAVENOUS | Status: DC
  Filled 2019-01-30: qty 250

## 2019-01-30 MED ORDER — POTASSIUM CHLORIDE 2 MEQ/ML IV SOLN
80.0000 meq | INTRAVENOUS | Status: DC
  Filled 2019-01-30: qty 40

## 2019-01-30 MED ORDER — SODIUM CHLORIDE 0.9 % IV SOLN
1.5000 g | INTRAVENOUS | Status: AC
  Administered 2019-01-31: .75 g via INTRAVENOUS
  Administered 2019-01-31: 1.5 g via INTRAVENOUS
  Filled 2019-01-30 (×2): qty 1.5

## 2019-01-30 MED ORDER — DEXMEDETOMIDINE HCL IN NACL 400 MCG/100ML IV SOLN
0.1000 ug/kg/h | INTRAVENOUS | Status: AC
  Administered 2019-01-31: .2 ug/kg/h via INTRAVENOUS
  Filled 2019-01-30: qty 100

## 2019-01-30 MED ORDER — EPINEPHRINE PF 1 MG/ML IJ SOLN
0.0000 ug/min | INTRAVENOUS | Status: DC
  Filled 2019-01-30: qty 4

## 2019-01-30 MED ORDER — TRANEXAMIC ACID (OHS) PUMP PRIME SOLUTION
2.0000 mg/kg | INTRAVENOUS | Status: DC
  Filled 2019-01-30: qty 1.1

## 2019-01-30 MED ORDER — MAGNESIUM SULFATE 50 % IJ SOLN
40.0000 meq | INTRAMUSCULAR | Status: DC
  Filled 2019-01-30: qty 9.85

## 2019-01-30 MED ORDER — NITROGLYCERIN IN D5W 200-5 MCG/ML-% IV SOLN
2.0000 ug/min | INTRAVENOUS | Status: AC
  Administered 2019-01-31: 10 ug/min via INTRAVENOUS
  Filled 2019-01-30: qty 250

## 2019-01-30 NOTE — Anesthesia Preprocedure Evaluation (Addendum)
Anesthesia Evaluation  Patient identified by MRN, date of birth, ID band Patient awake    Reviewed: Allergy & Precautions, NPO status , Patient's Chart, lab work & pertinent test results  Airway Mallampati: II  TM Distance: >3 FB Neck ROM: full    Dental  (+) Teeth Intact, Dental Advidsory Given   Pulmonary COPD,  COPD inhaler, former smoker,    breath sounds clear to auscultation       Cardiovascular hypertension, Pt. on medications +CHF  + Valvular Problems/Murmurs AI  Rhythm:regular Rate:Normal  Mild to moderate global reduction in LV systolic function; mild diastolic dysfunction; severe AI; mild MR.   Neuro/Psych negative neurological ROS     GI/Hepatic Neg liver ROS, GERD  ,  Endo/Other  negative endocrine ROS  Renal/GU Renal InsufficiencyRenal disease     Musculoskeletal   Abdominal   Peds  Hematology negative hematology ROS (+)   Anesthesia Other Findings   Reproductive/Obstetrics                          Lab Results  Component Value Date   WBC 5.0 01/27/2019   HGB 12.1 01/27/2019   HCT 35.7 (L) 01/27/2019   MCV 94.7 01/27/2019   PLT 216 01/27/2019   Lab Results  Component Value Date   CREATININE 1.13 (H) 01/27/2019   BUN 19 01/27/2019   NA 138 01/27/2019   K 3.9 01/27/2019   CL 102 01/27/2019   CO2 26 01/27/2019   COVID-19 Labs  No results for input(s): DDIMER, FERRITIN, LDH, CRP in the last 72 hours.  Lab Results  Component Value Date   [place] NEGATIVE 01/27/2019   [place] NEGATIVE 12/30/2018   [place] NEGATIVE 12/16/2018    Anesthesia Physical Anesthesia Plan  ASA: IV  Anesthesia Plan: General   Post-op Pain Management:    Induction: Intravenous  PONV Risk Score and Plan: 3 and Treatment may vary due to age or medical condition  Airway Management Planned: Oral ETT  Additional Equipment: Arterial line, CVP, PA Cath, TEE and Ultrasound  Guidance Line Placement  Intra-op Plan:   Post-operative Plan: Post-operative intubation/ventilation  Informed Consent: I have reviewed the patients History and Physical, chart, labs and discussed the procedure including the risks, benefits and alternatives for the proposed anesthesia with the patient or authorized representative who has indicated his/her understanding and acceptance.     Dental Advisory Given  Plan Discussed with: Anesthesiologist, CRNA and Surgeon  Anesthesia Plan Comments:       Anesthesia Quick Evaluation

## 2019-01-31 ENCOUNTER — Inpatient Hospital Stay (HOSPITAL_COMMUNITY)
Admission: RE | Admit: 2019-01-31 | Discharge: 2019-02-07 | DRG: 220 | Disposition: A | Discharge status: Home Health | Payer: Medicare Other | Attending: Cardiothoracic Surgery | Admitting: Cardiothoracic Surgery

## 2019-01-31 ENCOUNTER — Inpatient Hospital Stay (HOSPITAL_COMMUNITY): Payer: Medicare Other | Admitting: Anesthesiology

## 2019-01-31 ENCOUNTER — Inpatient Hospital Stay (HOSPITAL_COMMUNITY): Payer: Medicare Other

## 2019-01-31 ENCOUNTER — Encounter (HOSPITAL_COMMUNITY)
Admission: RE | Disposition: A | Discharge status: Home Health | Payer: Self-pay | Source: Home / Self Care | Attending: Cardiothoracic Surgery

## 2019-01-31 ENCOUNTER — Ambulatory Visit (HOSPITAL_COMMUNITY): Payer: Medicare Other

## 2019-01-31 ENCOUNTER — Other Ambulatory Visit: Payer: Self-pay

## 2019-01-31 ENCOUNTER — Encounter (HOSPITAL_COMMUNITY): Payer: Self-pay

## 2019-01-31 DIAGNOSIS — D62 Acute posthemorrhagic anemia: Secondary | ICD-10-CM | POA: Diagnosis not present

## 2019-01-31 DIAGNOSIS — Z8249 Family history of ischemic heart disease and other diseases of the circulatory system: Secondary | ICD-10-CM | POA: Diagnosis not present

## 2019-01-31 DIAGNOSIS — K219 Gastro-esophageal reflux disease without esophagitis: Secondary | ICD-10-CM | POA: Diagnosis present

## 2019-01-31 DIAGNOSIS — M8580 Other specified disorders of bone density and structure, unspecified site: Secondary | ICD-10-CM | POA: Diagnosis present

## 2019-01-31 DIAGNOSIS — E876 Hypokalemia: Secondary | ICD-10-CM | POA: Diagnosis not present

## 2019-01-31 DIAGNOSIS — I10 Essential (primary) hypertension: Secondary | ICD-10-CM | POA: Diagnosis not present

## 2019-01-31 DIAGNOSIS — I350 Nonrheumatic aortic (valve) stenosis: Secondary | ICD-10-CM | POA: Diagnosis not present

## 2019-01-31 DIAGNOSIS — I517 Cardiomegaly: Secondary | ICD-10-CM | POA: Diagnosis not present

## 2019-01-31 DIAGNOSIS — J9811 Atelectasis: Secondary | ICD-10-CM | POA: Diagnosis not present

## 2019-01-31 DIAGNOSIS — I480 Paroxysmal atrial fibrillation: Secondary | ICD-10-CM | POA: Diagnosis not present

## 2019-01-31 DIAGNOSIS — Z9851 Tubal ligation status: Secondary | ICD-10-CM | POA: Diagnosis not present

## 2019-01-31 DIAGNOSIS — E8770 Fluid overload, unspecified: Secondary | ICD-10-CM | POA: Diagnosis not present

## 2019-01-31 DIAGNOSIS — I110 Hypertensive heart disease with heart failure: Secondary | ICD-10-CM | POA: Diagnosis not present

## 2019-01-31 DIAGNOSIS — I471 Supraventricular tachycardia: Secondary | ICD-10-CM | POA: Diagnosis not present

## 2019-01-31 DIAGNOSIS — K08409 Partial loss of teeth, unspecified cause, unspecified class: Secondary | ICD-10-CM | POA: Diagnosis present

## 2019-01-31 DIAGNOSIS — I351 Nonrheumatic aortic (valve) insufficiency: Principal | ICD-10-CM | POA: Diagnosis present

## 2019-01-31 DIAGNOSIS — I083 Combined rheumatic disorders of mitral, aortic and tricuspid valves: Secondary | ICD-10-CM | POA: Diagnosis not present

## 2019-01-31 DIAGNOSIS — I5043 Acute on chronic combined systolic (congestive) and diastolic (congestive) heart failure: Secondary | ICD-10-CM | POA: Diagnosis not present

## 2019-01-31 DIAGNOSIS — E785 Hyperlipidemia, unspecified: Secondary | ICD-10-CM | POA: Diagnosis present

## 2019-01-31 DIAGNOSIS — Z952 Presence of prosthetic heart valve: Secondary | ICD-10-CM

## 2019-01-31 DIAGNOSIS — Z853 Personal history of malignant neoplasm of breast: Secondary | ICD-10-CM | POA: Diagnosis not present

## 2019-01-31 DIAGNOSIS — Z87891 Personal history of nicotine dependence: Secondary | ICD-10-CM | POA: Diagnosis not present

## 2019-01-31 DIAGNOSIS — R918 Other nonspecific abnormal finding of lung field: Secondary | ICD-10-CM | POA: Diagnosis not present

## 2019-01-31 DIAGNOSIS — Z8701 Personal history of pneumonia (recurrent): Secondary | ICD-10-CM | POA: Diagnosis not present

## 2019-01-31 DIAGNOSIS — R079 Chest pain, unspecified: Secondary | ICD-10-CM | POA: Diagnosis not present

## 2019-01-31 DIAGNOSIS — J90 Pleural effusion, not elsewhere classified: Secondary | ICD-10-CM | POA: Diagnosis not present

## 2019-01-31 DIAGNOSIS — J449 Chronic obstructive pulmonary disease, unspecified: Secondary | ICD-10-CM | POA: Diagnosis not present

## 2019-01-31 DIAGNOSIS — D689 Coagulation defect, unspecified: Secondary | ICD-10-CM | POA: Diagnosis not present

## 2019-01-31 DIAGNOSIS — I359 Nonrheumatic aortic valve disorder, unspecified: Secondary | ICD-10-CM | POA: Diagnosis present

## 2019-01-31 DIAGNOSIS — D696 Thrombocytopenia, unspecified: Secondary | ICD-10-CM | POA: Diagnosis present

## 2019-01-31 DIAGNOSIS — I272 Pulmonary hypertension, unspecified: Secondary | ICD-10-CM | POA: Diagnosis present

## 2019-01-31 DIAGNOSIS — Z419 Encounter for procedure for purposes other than remedying health state, unspecified: Secondary | ICD-10-CM

## 2019-01-31 HISTORY — PX: TEE WITHOUT CARDIOVERSION: SHX5443

## 2019-01-31 HISTORY — PX: AORTIC VALVE REPLACEMENT: SHX41

## 2019-01-31 LAB — POCT I-STAT, CHEM 8
BUN: 12 mg/dL (ref 8–23)
Calcium, Ion: 1.05 mmol/L — ABNORMAL LOW (ref 1.15–1.40)
Chloride: 105 mmol/L (ref 98–111)
Creatinine, Ser: 0.8 mg/dL (ref 0.44–1.00)
Glucose, Bld: 159 mg/dL — ABNORMAL HIGH (ref 70–99)
HCT: 28 % — ABNORMAL LOW (ref 36.0–46.0)
Hemoglobin: 9.5 g/dL — ABNORMAL LOW (ref 12.0–15.0)
Potassium: 3.7 mmol/L (ref 3.5–5.1)
Sodium: 141 mmol/L (ref 135–145)
TCO2: 24 mmol/L (ref 22–32)

## 2019-01-31 LAB — GLUCOSE, CAPILLARY
Glucose-Capillary: 115 mg/dL — ABNORMAL HIGH (ref 70–99)
Glucose-Capillary: 113 mg/dL — ABNORMAL HIGH (ref 70–99)
Glucose-Capillary: 118 mg/dL — ABNORMAL HIGH (ref 70–99)

## 2019-01-31 LAB — POCT I-STAT 4, (NA,K, GLUC, HGB,HCT)
Glucose, Bld: 100 mg/dL — ABNORMAL HIGH (ref 70–99)
Glucose, Bld: 118 mg/dL — ABNORMAL HIGH (ref 70–99)
Glucose, Bld: 138 mg/dL — ABNORMAL HIGH (ref 70–99)
Glucose, Bld: 102 mg/dL — ABNORMAL HIGH (ref 70–99)
Glucose, Bld: 108 mg/dL — ABNORMAL HIGH (ref 70–99)
HCT: 26 % — ABNORMAL LOW (ref 36.0–46.0)
HCT: 26 % — ABNORMAL LOW (ref 36.0–46.0)
HCT: 26 % — ABNORMAL LOW (ref 36.0–46.0)
HCT: 24 % — ABNORMAL LOW (ref 36.0–46.0)
HCT: 31 % — ABNORMAL LOW (ref 36.0–46.0)
Hemoglobin: 8.8 g/dL — ABNORMAL LOW (ref 12.0–15.0)
Hemoglobin: 8.8 g/dL — ABNORMAL LOW (ref 12.0–15.0)
Hemoglobin: 8.8 g/dL — ABNORMAL LOW (ref 12.0–15.0)
Hemoglobin: 8.2 g/dL — ABNORMAL LOW (ref 12.0–15.0)
Hemoglobin: 10.5 g/dL — ABNORMAL LOW (ref 12.0–15.0)
Potassium: 3.5 mmol/L (ref 3.5–5.1)
Potassium: 3.5 mmol/L (ref 3.5–5.1)
Potassium: 4 mmol/L (ref 3.5–5.1)
Potassium: 3.5 mmol/L (ref 3.5–5.1)
Potassium: 3.6 mmol/L (ref 3.5–5.1)
Sodium: 142 mmol/L (ref 135–145)
Sodium: 144 mmol/L (ref 135–145)
Sodium: 138 mmol/L (ref 135–145)
Sodium: 143 mmol/L (ref 135–145)
Sodium: 144 mmol/L (ref 135–145)

## 2019-01-31 LAB — CBC
HCT: 31.6 % — ABNORMAL LOW (ref 36.0–46.0)
Hemoglobin: 10.6 g/dL — ABNORMAL LOW (ref 12.0–15.0)
MCH: 30.7 pg (ref 26.0–34.0)
MCHC: 33.5 g/dL (ref 30.0–36.0)
MCV: 91.6 fL (ref 80.0–100.0)
Platelets: 97 10*3/uL — ABNORMAL LOW (ref 150–400)
RBC: 3.45 MIL/uL — ABNORMAL LOW (ref 3.87–5.11)
RDW: 15.5 % (ref 11.5–15.5)
WBC: 7.8 10*3/uL (ref 4.0–10.5)
nRBC: 0 % (ref 0.0–0.2)

## 2019-01-31 LAB — POCT I-STAT 7, (LYTES, BLD GAS, ICA,H+H)
Acid-Base Excess: 2 mmol/L (ref 0.0–2.0)
Acid-base deficit: 2 mmol/L (ref 0.0–2.0)
Acid-base deficit: 3 mmol/L — ABNORMAL HIGH (ref 0.0–2.0)
Bicarbonate: 27.2 mmol/L (ref 20.0–28.0)
Bicarbonate: 21.6 mmol/L (ref 20.0–28.0)
Bicarbonate: 22 mmol/L (ref 20.0–28.0)
Calcium, Ion: 1.03 mmol/L — ABNORMAL LOW (ref 1.15–1.40)
Calcium, Ion: 1.04 mmol/L — ABNORMAL LOW (ref 1.15–1.40)
Calcium, Ion: 1.09 mmol/L — ABNORMAL LOW (ref 1.15–1.40)
HCT: 21 % — ABNORMAL LOW (ref 36.0–46.0)
HCT: 25 % — ABNORMAL LOW (ref 36.0–46.0)
HCT: 28 % — ABNORMAL LOW (ref 36.0–46.0)
Hemoglobin: 7.1 g/dL — ABNORMAL LOW (ref 12.0–15.0)
Hemoglobin: 8.5 g/dL — ABNORMAL LOW (ref 12.0–15.0)
Hemoglobin: 9.5 g/dL — ABNORMAL LOW (ref 12.0–15.0)
O2 Saturation: 100 %
O2 Saturation: 100 %
O2 Saturation: 95 %
Patient temperature: 35.6
Potassium: 3.7 mmol/L (ref 3.5–5.1)
Potassium: 3.6 mmol/L (ref 3.5–5.1)
Potassium: 3.7 mmol/L (ref 3.5–5.1)
Sodium: 143 mmol/L (ref 135–145)
Sodium: 143 mmol/L (ref 135–145)
Sodium: 144 mmol/L (ref 135–145)
TCO2: 29 mmol/L (ref 22–32)
TCO2: 23 mmol/L (ref 22–32)
TCO2: 23 mmol/L (ref 22–32)
pCO2 arterial: 42.5 mmHg (ref 32.0–48.0)
pCO2 arterial: 32.7 mmHg (ref 32.0–48.0)
pCO2 arterial: 36.2 mmHg (ref 32.0–48.0)
pH, Arterial: 7.415 (ref 7.350–7.450)
pH, Arterial: 7.429 (ref 7.350–7.450)
pH, Arterial: 7.386 (ref 7.350–7.450)
pO2, Arterial: 336 mmHg — ABNORMAL HIGH (ref 83.0–108.0)
pO2, Arterial: 164 mmHg — ABNORMAL HIGH (ref 83.0–108.0)
pO2, Arterial: 71 mmHg — ABNORMAL LOW (ref 83.0–108.0)

## 2019-01-31 LAB — BASIC METABOLIC PANEL
Anion gap: 8 (ref 5–15)
BUN: 12 mg/dL (ref 8–23)
CO2: 21 mmol/L — ABNORMAL LOW (ref 22–32)
Calcium: 7 mg/dL — ABNORMAL LOW (ref 8.9–10.3)
Chloride: 110 mmol/L (ref 98–111)
Creatinine, Ser: 0.92 mg/dL (ref 0.44–1.00)
GFR calc Af Amer: >60 mL/min (ref >60)
GFR calc non Af Amer: >60 mL/min (ref >60)
Glucose, Bld: 165 mg/dL — ABNORMAL HIGH (ref 70–99)
Potassium: 3.7 mmol/L (ref 3.5–5.1)
Sodium: 139 mmol/L (ref 135–145)

## 2019-01-31 LAB — ECHO INTRAOPERATIVE TEE
Height: 64 in
Weight: 1939.2 oz

## 2019-01-31 LAB — PREPARE RBC (CROSSMATCH)

## 2019-01-31 LAB — PROTIME-INR
INR: 1.6 — ABNORMAL HIGH (ref 0.8–1.2)
Prothrombin Time: 18.4 seconds — ABNORMAL HIGH (ref 11.4–15.2)

## 2019-01-31 LAB — PLATELET COUNT: Platelets: 136 10*3/uL — ABNORMAL LOW (ref 150–400)

## 2019-01-31 LAB — MAGNESIUM: Magnesium: 2.7 mg/dL — ABNORMAL HIGH (ref 1.7–2.4)

## 2019-01-31 LAB — HEMOGLOBIN AND HEMATOCRIT, BLOOD
HCT: 30.4 % — ABNORMAL LOW (ref 36.0–46.0)
Hemoglobin: 10.1 g/dL — ABNORMAL LOW (ref 12.0–15.0)

## 2019-01-31 LAB — APTT: aPTT: 32 seconds (ref 24–36)

## 2019-01-31 SURGERY — REPLACEMENT, AORTIC VALVE, OPEN
Anesthesia: General | Site: Chest

## 2019-01-31 MED ORDER — INSULIN REGULAR(HUMAN) IN NACL 100-0.9 UT/100ML-% IV SOLN: INTRAVENOUS | Status: DC

## 2019-01-31 MED ORDER — CHLORHEXIDINE GLUCONATE CLOTH 2 % EX PADS
6.0000 | MEDICATED_PAD | Freq: Every day | CUTANEOUS | Status: DC
  Administered 2019-02-01: 16:00:00 6 via TOPICAL

## 2019-01-31 MED ORDER — ACETAMINOPHEN 160 MG/5ML PO SOLN
1000.0000 mg | Freq: Four times a day (QID) | ORAL | Status: AC
  Administered 2019-01-31: 1000 mg
  Filled 2019-01-31: qty 40.6

## 2019-01-31 MED ORDER — ALBUMIN HUMAN 5 % IV SOLN
250.0000 mL | INTRAVENOUS | Status: AC | PRN
  Administered 2019-01-31 (×2): 12.5 g via INTRAVENOUS

## 2019-01-31 MED ORDER — METOPROLOL TARTRATE 5 MG/5ML IV SOLN
2.5000 mg | INTRAVENOUS | Status: DC | PRN
  Administered 2019-01-31 – 2019-02-03 (×2): 5 mg via INTRAVENOUS
  Filled 2019-01-31: qty 5

## 2019-01-31 MED ORDER — PHENYLEPHRINE HCL (PRESSORS) 10 MG/ML IV SOLN
INTRAVENOUS | Status: DC | PRN
  Administered 2019-01-31: 20 ug via INTRAVENOUS
  Administered 2019-01-31: 40 ug via INTRAVENOUS
  Administered 2019-01-31: 80 ug via INTRAVENOUS

## 2019-01-31 MED ORDER — HEMOSTATIC AGENTS (NO CHARGE) OPTIME
TOPICAL | Status: DC | PRN
  Administered 2019-01-31 (×3): 1 via TOPICAL

## 2019-01-31 MED ORDER — HEPARIN SODIUM (PORCINE) 1000 UNIT/ML IJ SOLN
INTRAMUSCULAR | Status: DC | PRN
  Administered 2019-01-31: 20000 [IU] via INTRAVENOUS

## 2019-01-31 MED ORDER — ATORVASTATIN CALCIUM 10 MG PO TABS
20.0000 mg | ORAL_TABLET | Freq: Every day | ORAL | Status: DC
  Administered 2019-02-01 – 2019-02-07 (×7): 20 mg via ORAL
  Filled 2019-01-31 (×7): qty 2

## 2019-01-31 MED ORDER — 0.9 % SODIUM CHLORIDE (POUR BTL) OPTIME
TOPICAL | Status: DC | PRN
  Administered 2019-01-31: 5000 mL

## 2019-01-31 MED ORDER — MORPHINE SULFATE (PF) 2 MG/ML IV SOLN
1.0000 mg | INTRAVENOUS | Status: DC | PRN
  Administered 2019-01-31: 20:00:00 1 mg via INTRAVENOUS
  Administered 2019-01-31: 15:00:00 2 mg via INTRAVENOUS
  Administered 2019-01-31: 23:00:00 1 mg via INTRAVENOUS
  Administered 2019-02-01 (×5): 2 mg via INTRAVENOUS
  Filled 2019-01-31 (×8): qty 1

## 2019-01-31 MED ORDER — SODIUM CHLORIDE 0.9 % IV SOLN: 250.0000 mL | INTRAVENOUS | Status: DC

## 2019-01-31 MED ORDER — SODIUM CHLORIDE 0.9 % IV SOLN
INTRAVENOUS | Status: DC | PRN
  Administered 2019-01-31 (×2): via INTRAVENOUS

## 2019-01-31 MED ORDER — ORAL CARE MOUTH RINSE
15.0000 mL | OROMUCOSAL | Status: DC
  Administered 2019-01-31 – 2019-02-01 (×5): 15 mL via OROMUCOSAL

## 2019-01-31 MED ORDER — MIDAZOLAM HCL 5 MG/5ML IJ SOLN
INTRAMUSCULAR | Status: DC | PRN
  Administered 2019-01-31 (×2): 2 mg via INTRAVENOUS
  Administered 2019-01-31: 1 mg via INTRAVENOUS

## 2019-01-31 MED ORDER — POTASSIUM CHLORIDE 10 MEQ/50ML IV SOLN
10.0000 meq | INTRAVENOUS | Status: AC
  Administered 2019-01-31 (×3): 10 meq via INTRAVENOUS

## 2019-01-31 MED ORDER — ASPIRIN EC 325 MG PO TBEC
325.0000 mg | DELAYED_RELEASE_TABLET | Freq: Every day | ORAL | Status: DC
  Administered 2019-02-01 – 2019-02-07 (×6): 325 mg via ORAL
  Filled 2019-01-31 (×7): qty 1

## 2019-01-31 MED ORDER — SERTRALINE HCL 100 MG PO TABS
100.0000 mg | ORAL_TABLET | Freq: Every day | ORAL | Status: DC
  Administered 2019-02-01 – 2019-02-06 (×6): 100 mg via ORAL
  Filled 2019-01-31 (×6): qty 1

## 2019-01-31 MED ORDER — OXYCODONE HCL 5 MG PO TABS
5.0000 mg | ORAL_TABLET | ORAL | Status: DC | PRN
  Administered 2019-02-01: 10 mg via ORAL
  Filled 2019-01-31: qty 2

## 2019-01-31 MED ORDER — PHENYLEPHRINE HCL-NACL 20-0.9 MG/250ML-% IV SOLN: 0.0000 ug/min | INTRAVENOUS | Status: DC

## 2019-01-31 MED ORDER — NICARDIPINE HCL IN NACL 20-0.86 MG/200ML-% IV SOLN
3.0000 mg/h | INTRAVENOUS | Status: DC
  Administered 2019-01-31: 3 mg/h via INTRAVENOUS
  Administered 2019-02-01: 2 mg/h via INTRAVENOUS
  Filled 2019-01-31 (×2): qty 200

## 2019-01-31 MED ORDER — SODIUM CHLORIDE 0.9 % IV SOLN
1.5000 g | Freq: Two times a day (BID) | INTRAVENOUS | Status: AC
  Administered 2019-01-31 – 2019-02-02 (×4): 1.5 g via INTRAVENOUS
  Filled 2019-01-31 (×4): qty 1.5

## 2019-01-31 MED ORDER — ARTIFICIAL TEARS OPHTHALMIC OINT
TOPICAL_OINTMENT | OPHTHALMIC | Status: DC | PRN
  Administered 2019-01-31: 1 via OPHTHALMIC

## 2019-01-31 MED ORDER — MAGNESIUM SULFATE 4 GM/100ML IV SOLN
INTRAVENOUS | Status: AC
  Filled 2019-01-31: qty 100

## 2019-01-31 MED ORDER — ALBUMIN HUMAN 5 % IV SOLN
INTRAVENOUS | Status: DC | PRN
  Administered 2019-01-31 (×2): via INTRAVENOUS

## 2019-01-31 MED ORDER — VANCOMYCIN HCL IN DEXTROSE 1-5 GM/200ML-% IV SOLN
1000.0000 mg | Freq: Two times a day (BID) | INTRAVENOUS | Status: AC
  Administered 2019-01-31 – 2019-02-01 (×2): 1000 mg via INTRAVENOUS
  Filled 2019-01-31 (×2): qty 200

## 2019-01-31 MED ORDER — LACTATED RINGERS IV SOLN: INTRAVENOUS | Status: DC

## 2019-01-31 MED ORDER — ACETAMINOPHEN 650 MG RE SUPP
650.0000 mg | Freq: Once | RECTAL | Status: AC
  Administered 2019-01-31: 18:00:00 650 mg via RECTAL

## 2019-01-31 MED ORDER — ACETAMINOPHEN 500 MG PO TABS
1000.0000 mg | ORAL_TABLET | Freq: Four times a day (QID) | ORAL | Status: AC
  Administered 2019-02-01 – 2019-02-05 (×8): 1000 mg via ORAL
  Filled 2019-01-31 (×11): qty 2

## 2019-01-31 MED ORDER — ROCURONIUM BROMIDE 10 MG/ML (PF) SYRINGE
PREFILLED_SYRINGE | INTRAVENOUS | Status: DC | PRN
  Administered 2019-01-31 (×3): 50 mg via INTRAVENOUS

## 2019-01-31 MED ORDER — METOPROLOL TARTRATE 25 MG/10 ML ORAL SUSPENSION: 12.5000 mg | Freq: Two times a day (BID) | ORAL | Status: DC

## 2019-01-31 MED ORDER — SODIUM CHLORIDE 0.9% FLUSH
3.0000 mL | Freq: Two times a day (BID) | INTRAVENOUS | Status: DC
  Administered 2019-02-01 – 2019-02-07 (×7): 3 mL via INTRAVENOUS

## 2019-01-31 MED ORDER — SODIUM CHLORIDE 0.9% FLUSH: 3.0000 mL | INTRAVENOUS | Status: DC | PRN

## 2019-01-31 MED ORDER — BISACODYL 5 MG PO TBEC
10.0000 mg | DELAYED_RELEASE_TABLET | Freq: Every day | ORAL | Status: DC
  Administered 2019-02-01 – 2019-02-07 (×3): 10 mg via ORAL
  Filled 2019-01-31 (×6): qty 2

## 2019-01-31 MED ORDER — CHLORHEXIDINE GLUCONATE 4 % EX LIQD: 30.0000 mL | CUTANEOUS | Status: DC

## 2019-01-31 MED ORDER — FENTANYL CITRATE (PF) 250 MCG/5ML IJ SOLN
INTRAMUSCULAR | Status: AC
  Filled 2019-01-31: qty 25

## 2019-01-31 MED ORDER — LACTATED RINGERS IV SOLN
INTRAVENOUS | Status: DC | PRN
  Administered 2019-01-31: 07:00:00 via INTRAVENOUS

## 2019-01-31 MED ORDER — LACTATED RINGERS IV SOLN
INTRAVENOUS | Status: DC
  Administered 2019-02-02: 01:00:00 via INTRAVENOUS

## 2019-01-31 MED ORDER — SODIUM CHLORIDE 0.9 % IV SOLN
INTRAVENOUS | Status: DC | PRN
  Administered 2019-01-31: 12:00:00 25 ug/min via INTRAVENOUS

## 2019-01-31 MED ORDER — PROPOFOL 10 MG/ML IV BOLUS
INTRAVENOUS | Status: AC
  Filled 2019-01-31: qty 20

## 2019-01-31 MED ORDER — INSULIN REGULAR BOLUS VIA INFUSION
0.0000 [IU] | Freq: Three times a day (TID) | INTRAVENOUS | Status: DC
  Filled 2019-01-31: qty 10

## 2019-01-31 MED ORDER — METOPROLOL TARTRATE 12.5 MG HALF TABLET
12.5000 mg | ORAL_TABLET | Freq: Once | ORAL | Status: DC
  Filled 2019-01-31: qty 1

## 2019-01-31 MED ORDER — MIDAZOLAM HCL 2 MG/2ML IJ SOLN: 2.0000 mg | INTRAMUSCULAR | Status: AC | PRN

## 2019-01-31 MED ORDER — LEVALBUTEROL HCL 1.25 MG/0.5ML IN NEBU
INHALATION_SOLUTION | RESPIRATORY_TRACT | Status: AC
  Filled 2019-01-31: qty 0.5

## 2019-01-31 MED ORDER — CALCIUM CHLORIDE 10 % IV SOLN: 1.0000 g | Freq: Once | INTRAVENOUS | Status: DC | PRN

## 2019-01-31 MED ORDER — ASPIRIN 81 MG PO CHEW
324.0000 mg | CHEWABLE_TABLET | Freq: Every day | ORAL | Status: DC
  Administered 2019-02-03: 324 mg
  Filled 2019-01-31: qty 4

## 2019-01-31 MED ORDER — SODIUM CHLORIDE 0.45 % IV SOLN: INTRAVENOUS | Status: DC | PRN

## 2019-01-31 MED ORDER — FAMOTIDINE IN NACL 20-0.9 MG/50ML-% IV SOLN
20.0000 mg | Freq: Two times a day (BID) | INTRAVENOUS | Status: AC
  Administered 2019-01-31 (×2): 20 mg via INTRAVENOUS
  Filled 2019-01-31: qty 50

## 2019-01-31 MED ORDER — TRAMADOL HCL 50 MG PO TABS
50.0000 mg | ORAL_TABLET | ORAL | Status: DC | PRN
  Administered 2019-02-01 – 2019-02-02 (×3): 100 mg via ORAL
  Filled 2019-01-31 (×3): qty 2

## 2019-01-31 MED ORDER — POTASSIUM CHLORIDE 10 MEQ/50ML IV SOLN
10.0000 meq | INTRAVENOUS | Status: AC
  Administered 2019-01-31 (×3): 10 meq via INTRAVENOUS
  Filled 2019-01-31 (×3): qty 50

## 2019-01-31 MED ORDER — NITROGLYCERIN IN D5W 200-5 MCG/ML-% IV SOLN: 0.0000 ug/min | INTRAVENOUS | Status: DC

## 2019-01-31 MED ORDER — LEVALBUTEROL HCL 1.25 MG/0.5ML IN NEBU
1.2500 mg | INHALATION_SOLUTION | Freq: Four times a day (QID) | RESPIRATORY_TRACT | Status: DC
  Administered 2019-01-31: 15:00:00 1.25 mg via RESPIRATORY_TRACT

## 2019-01-31 MED ORDER — CHLORHEXIDINE GLUCONATE 0.12% ORAL RINSE (MEDLINE KIT)
15.0000 mL | Freq: Two times a day (BID) | OROMUCOSAL | Status: DC
  Administered 2019-01-31: 19:00:00 15 mL via OROMUCOSAL

## 2019-01-31 MED ORDER — CHLORHEXIDINE GLUCONATE 0.12 % MT SOLN
15.0000 mL | Freq: Once | OROMUCOSAL | Status: AC
  Administered 2019-01-31: 07:00:00 15 mL via OROMUCOSAL
  Filled 2019-01-31: qty 15

## 2019-01-31 MED ORDER — METOCLOPRAMIDE HCL 5 MG/ML IJ SOLN
10.0000 mg | Freq: Four times a day (QID) | INTRAMUSCULAR | Status: DC
  Administered 2019-01-31 – 2019-02-01 (×5): 10 mg via INTRAVENOUS
  Filled 2019-01-31 (×4): qty 2

## 2019-01-31 MED ORDER — ONDANSETRON HCL 4 MG/2ML IJ SOLN
4.0000 mg | Freq: Four times a day (QID) | INTRAMUSCULAR | Status: DC | PRN
  Administered 2019-01-31: 20:00:00 4 mg via INTRAVENOUS
  Filled 2019-01-31: qty 2

## 2019-01-31 MED ORDER — PROPOFOL 10 MG/ML IV BOLUS
INTRAVENOUS | Status: DC | PRN
  Administered 2019-01-31: 30 mg via INTRAVENOUS

## 2019-01-31 MED ORDER — MIDAZOLAM HCL (PF) 10 MG/2ML IJ SOLN
INTRAMUSCULAR | Status: AC
  Filled 2019-01-31: qty 2

## 2019-01-31 MED ORDER — LIDOCAINE HCL (CARDIAC) PF 100 MG/5ML IV SOSY
PREFILLED_SYRINGE | INTRAVENOUS | Status: DC | PRN
  Administered 2019-01-31: 60 mg via INTRATRACHEAL

## 2019-01-31 MED ORDER — DEXMEDETOMIDINE HCL IN NACL 200 MCG/50ML IV SOLN: 0.0000 ug/kg/h | INTRAVENOUS | Status: DC

## 2019-01-31 MED ORDER — SODIUM CHLORIDE 0.9 % IV SOLN
20.0000 ug | Freq: Once | INTRAVENOUS | Status: AC
  Administered 2019-01-31: 20 ug via INTRAVENOUS
  Filled 2019-01-31: qty 5

## 2019-01-31 MED ORDER — SODIUM CHLORIDE (PF) 0.9 % IJ SOLN
OROMUCOSAL | Status: DC | PRN
  Administered 2019-01-31 (×3): 4 mL via TOPICAL

## 2019-01-31 MED ORDER — BISACODYL 10 MG RE SUPP: 10.0000 mg | Freq: Every day | RECTAL | Status: DC

## 2019-01-31 MED ORDER — FENTANYL CITRATE (PF) 250 MCG/5ML IJ SOLN
INTRAMUSCULAR | Status: DC | PRN
  Administered 2019-01-31 (×2): 50 ug via INTRAVENOUS
  Administered 2019-01-31 (×2): 100 ug via INTRAVENOUS
  Administered 2019-01-31: 50 ug via INTRAVENOUS
  Administered 2019-01-31: 250 ug via INTRAVENOUS
  Administered 2019-01-31: 150 ug via INTRAVENOUS
  Administered 2019-01-31: 200 ug via INTRAVENOUS
  Administered 2019-01-31: 100 ug via INTRAVENOUS

## 2019-01-31 MED ORDER — DOBUTAMINE IN D5W 4-5 MG/ML-% IV SOLN: 2.5000 ug/kg/min | INTRAVENOUS | Status: DC

## 2019-01-31 MED ORDER — METOPROLOL TARTRATE 12.5 MG HALF TABLET
12.5000 mg | ORAL_TABLET | Freq: Two times a day (BID) | ORAL | Status: DC
  Administered 2019-02-01: 12.5 mg via ORAL
  Filled 2019-01-31: qty 1

## 2019-01-31 MED ORDER — LEVALBUTEROL HCL 1.25 MG/0.5ML IN NEBU
1.2500 mg | INHALATION_SOLUTION | Freq: Four times a day (QID) | RESPIRATORY_TRACT | Status: DC
  Administered 2019-01-31 – 2019-02-01 (×2): 1.25 mg via RESPIRATORY_TRACT
  Filled 2019-01-31 (×3): qty 0.5

## 2019-01-31 MED ORDER — CHLORHEXIDINE GLUCONATE 0.12 % MT SOLN
15.0000 mL | OROMUCOSAL | Status: DC
  Administered 2019-01-31: 16:00:00 15 mL via OROMUCOSAL

## 2019-01-31 MED ORDER — DOBUTAMINE IN D5W 4-5 MG/ML-% IV SOLN
3.0000 ug/kg/min | INTRAVENOUS | Status: DC
  Administered 2019-01-31: 3 ug/kg/min via INTRAVENOUS
  Administered 2019-02-01: 2.5 ug/kg/min via INTRAVENOUS
  Filled 2019-01-31: qty 250

## 2019-01-31 MED ORDER — NITROGLYCERIN IN D5W 200-5 MCG/ML-% IV SOLN: 7.0000 ug/min | INTRAVENOUS | Status: DC

## 2019-01-31 MED ORDER — ACETAMINOPHEN 160 MG/5ML PO SOLN: 650.0000 mg | Freq: Once | ORAL | Status: AC

## 2019-01-31 MED ORDER — SODIUM CHLORIDE 0.9 % IV SOLN
INTRAVENOUS | Status: DC
  Administered 2019-01-31: 15:00:00 via INTRAVENOUS

## 2019-01-31 MED ORDER — MAGNESIUM SULFATE 4 GM/100ML IV SOLN
4.0000 g | Freq: Once | INTRAVENOUS | Status: AC
  Administered 2019-01-31: 15:00:00 4 g via INTRAVENOUS

## 2019-01-31 MED ORDER — PANTOPRAZOLE SODIUM 40 MG PO TBEC
40.0000 mg | DELAYED_RELEASE_TABLET | Freq: Every day | ORAL | Status: DC
  Administered 2019-02-02 – 2019-02-07 (×6): 40 mg via ORAL
  Filled 2019-01-31 (×6): qty 1

## 2019-01-31 MED ORDER — PROTAMINE SULFATE 10 MG/ML IV SOLN
INTRAVENOUS | Status: DC | PRN
  Administered 2019-01-31: 10 mg via INTRAVENOUS
  Administered 2019-01-31: 100 mg via INTRAVENOUS
  Administered 2019-01-31: 60 mg via INTRAVENOUS
  Administered 2019-01-31: 40 mg via INTRAVENOUS

## 2019-01-31 MED ORDER — DOCUSATE SODIUM 100 MG PO CAPS
200.0000 mg | ORAL_CAPSULE | Freq: Every day | ORAL | Status: DC
  Administered 2019-02-01 – 2019-02-07 (×4): 200 mg via ORAL
  Filled 2019-01-31 (×7): qty 2

## 2019-01-31 MED ORDER — PLASMA-LYTE 148 IV SOLN
INTRAVENOUS | Status: DC | PRN
  Administered 2019-01-31: 250 mL via INTRAVASCULAR

## 2019-01-31 MED ORDER — CHLORHEXIDINE GLUCONATE 0.12 % MT SOLN
OROMUCOSAL | Status: AC
  Filled 2019-01-31: qty 15

## 2019-01-31 SURGICAL SUPPLY — 85 items
ADAPTER CARDIO PERF ANTE/RETRO (ADAPTER) ×4 IMPLANT
ADH SRG 12 PREFL SYR 3 SPRDR (MISCELLANEOUS)
ADPR PRFSN 84XANTGRD RTRGD (ADAPTER) ×2
AGENT HMST KT MTR STRL THRMB (HEMOSTASIS) ×4
BAG DECANTER FOR FLEXI CONT (MISCELLANEOUS) ×4 IMPLANT
BLADE CLIPPER SURG (BLADE) ×4 IMPLANT
BLADE STERNUM SYSTEM 6 (BLADE) ×4 IMPLANT
BLADE SURG 12 STRL SS (BLADE) ×4 IMPLANT
BLADE SURG 15 STRL LF DISP TIS (BLADE) ×2 IMPLANT
BLADE SURG 15 STRL SS (BLADE) ×4
CANISTER SUCT 3000ML PPV (MISCELLANEOUS) ×4 IMPLANT
CANNULA GUNDRY RCSP 15FR (MISCELLANEOUS) ×4 IMPLANT
CATH CPB KIT VANTRIGT (MISCELLANEOUS) ×4 IMPLANT
CATH HEART VENT LEFT (CATHETERS) ×2 IMPLANT
CATH RETROPLEGIA CORONARY 14FR (CATHETERS) IMPLANT
CATH ROBINSON RED A/P 18FR (CATHETERS) ×12 IMPLANT
CATH THORACIC 28FR RT ANG (CATHETERS) ×2 IMPLANT
CATH THORACIC 36FR RT ANG (CATHETERS) ×2 IMPLANT
CATH/SQUID NICHOLS JEHLE COR (CATHETERS) ×2 IMPLANT
CLIP FOGARTY SPRING 6M (CLIP) IMPLANT
CONT SPEC 4OZ CLIKSEAL STRL BL (MISCELLANEOUS) ×2 IMPLANT
DRAIN CHANNEL 32F RND 10.7 FF (WOUND CARE) ×4 IMPLANT
DRAPE CARDIOVASCULAR INCISE (DRAPES) ×4
DRAPE SLUSH/WARMER DISC (DRAPES) ×4 IMPLANT
DRAPE SRG 135X102X78XABS (DRAPES) ×2 IMPLANT
DRSG AQUACEL AG ADV 3.5X14 (GAUZE/BANDAGES/DRESSINGS) ×4 IMPLANT
ELECT BLADE 4.0 EZ CLEAN MEGAD (MISCELLANEOUS) ×4
ELECT BLADE 6.5 EXT (BLADE) ×4 IMPLANT
ELECT CAUTERY BLADE 6.4 (BLADE) ×4 IMPLANT
ELECT REM PT RETURN 9FT ADLT (ELECTROSURGICAL) ×8
ELECTRODE BLDE 4.0 EZ CLN MEGD (MISCELLANEOUS) ×2 IMPLANT
ELECTRODE REM PT RTRN 9FT ADLT (ELECTROSURGICAL) ×4 IMPLANT
FELT TEFLON 1X6 (MISCELLANEOUS) ×8 IMPLANT
GAUZE SPONGE 4X4 12PLY STRL (GAUZE/BANDAGES/DRESSINGS) ×6 IMPLANT
GLOVE BIO SURGEON STRL SZ7 (GLOVE) ×2 IMPLANT
GLOVE BIO SURGEON STRL SZ7.5 (GLOVE) ×10 IMPLANT
GOWN STRL REUS W/ TWL LRG LVL3 (GOWN DISPOSABLE) ×8 IMPLANT
GOWN STRL REUS W/TWL LRG LVL3 (GOWN DISPOSABLE) ×40
HEMOSTAT POWDER SURGIFOAM 1G (HEMOSTASIS) ×12 IMPLANT
HEMOSTAT SURGICEL 2X14 (HEMOSTASIS) ×4 IMPLANT
INSERT FOGARTY XLG (MISCELLANEOUS) IMPLANT
KIT BASIN OR (CUSTOM PROCEDURE TRAY) ×4 IMPLANT
KIT SUCTION CATH 14FR (SUCTIONS) ×4 IMPLANT
KIT TURNOVER KIT B (KITS) ×4 IMPLANT
LEAD PACING MYOCARDI (MISCELLANEOUS) ×4 IMPLANT
LINE VENT (MISCELLANEOUS) ×2 IMPLANT
NS IRRIG 1000ML POUR BTL (IV SOLUTION) ×22 IMPLANT
PACK OPEN HEART (CUSTOM PROCEDURE TRAY) ×4 IMPLANT
POSITIONER HEAD DONUT 9IN (MISCELLANEOUS) ×4 IMPLANT
SET CARDIOPLEGIA MPS 5001102 (MISCELLANEOUS) ×2 IMPLANT
SPONGE LAP 18X18 RF (DISPOSABLE) ×2 IMPLANT
STOPCOCK 4 WAY LG BORE MALE ST (IV SETS) ×2 IMPLANT
SURGIFLO W/THROMBIN 8M KIT (HEMOSTASIS) ×6 IMPLANT
SUT BONE WAX W31G (SUTURE) ×4 IMPLANT
SUT ETHIBON 2 0 V 52N 30 (SUTURE) ×8 IMPLANT
SUT ETHIBON EXCEL 2-0 V-5 (SUTURE) ×6 IMPLANT
SUT ETHIBOND 2 0 SH (SUTURE) ×4
SUT ETHIBOND 2 0 SH 36X2 (SUTURE) ×2 IMPLANT
SUT ETHIBOND V-5 VALVE (SUTURE) ×6 IMPLANT
SUT PROLENE 3 0 RB 1 (SUTURE) ×4 IMPLANT
SUT PROLENE 3 0 SH 1 (SUTURE) IMPLANT
SUT PROLENE 3 0 SH DA (SUTURE) IMPLANT
SUT PROLENE 4 0 RB 1 (SUTURE) ×24
SUT PROLENE 4 0 SH DA (SUTURE) ×12 IMPLANT
SUT PROLENE 4-0 RB1 .5 CRCL 36 (SUTURE) ×6 IMPLANT
SUT SILK 2 0 SH CR/8 (SUTURE) ×2 IMPLANT
SUT STEEL 6MS V (SUTURE) ×6 IMPLANT
SUT STEEL SZ 6 DBL 3X14 BALL (SUTURE) ×4 IMPLANT
SUT VIC AB 1 CTX 36 (SUTURE) ×8
SUT VIC AB 1 CTX36XBRD ANBCTR (SUTURE) ×4 IMPLANT
SUT VIC AB 2-0 CTX 27 (SUTURE) IMPLANT
SUT VIC AB 3-0 X1 27 (SUTURE) IMPLANT
SYR 10ML KIT SKIN ADHESIVE (MISCELLANEOUS) IMPLANT
SYSTEM SAHARA CHEST DRAIN ATS (WOUND CARE) ×4 IMPLANT
TAPE CLOTH SOFT 2X10 (GAUZE/BANDAGES/DRESSINGS) ×2 IMPLANT
TAPE CLOTH SURG 4X10 WHT LF (GAUZE/BANDAGES/DRESSINGS) ×2 IMPLANT
TOWEL GREEN STERILE (TOWEL DISPOSABLE) ×4 IMPLANT
TOWEL GREEN STERILE FF (TOWEL DISPOSABLE) ×4 IMPLANT
TRAY CATH LUMEN 1 20CM STRL (SET/KITS/TRAYS/PACK) ×2 IMPLANT
TRAY FOLEY SLVR 16FR TEMP STAT (SET/KITS/TRAYS/PACK) ×4 IMPLANT
TUBING ART PRESS 48 MALE/FEM (TUBING) ×4 IMPLANT
UNDERPAD 30X30 (UNDERPADS AND DIAPERS) ×4 IMPLANT
VALVE AORTIC SZ21 INSP/RESIL (Valve) ×2 IMPLANT
VENT LEFT HEART 12002 (CATHETERS) ×4
WATER STERILE IRR 1000ML POUR (IV SOLUTION) ×8 IMPLANT

## 2019-01-31 NOTE — Progress Notes (Signed)
RT assessed pt for readiness to progress to next level of heart wean. Pt able to hold head off of pillow for 20+ sec, squeeze RT hand, wiggle toes, and stick out tongue. Pt following directions and appropriate for next phase of wean. [name] Pt placed on PS/CPAP mode PS 10 Peep 5 and FIO2 40%. Pt tolerating well at this time. Pt still having some difficulty with initiating breaths while sleeping. RT will continue monitor.

## 2019-01-31 NOTE — Progress Notes (Signed)
Pre Procedure note for inpatients:   Dana Pruitt has been scheduled for Procedure(s): AORTIC VALVE REPLACEMENT (AVR) (N/A) TRANSESOPHAGEAL ECHOCARDIOGRAM (TEE) (N/A) today. The various methods of treatment have been discussed with the patient. After consideration of the risks, benefits and treatment options the patient has consented to the planned procedure.   The patient has been seen and labs reviewed. There are no changes in the patient's condition to prevent proceeding with the planned procedure today.  Recent labs:  Lab Results  Component Value Date   WBC 5.0 01/27/2019   HGB 12.1 01/27/2019   HCT 35.7 (L) 01/27/2019   PLT 216 01/27/2019   GLUCOSE 100 (H) 01/27/2019   CHOL 140 12/17/2018   TRIG 103 12/17/2018   HDL 38 (L) 12/17/2018   LDLCALC 81 12/17/2018   ALT 16 01/27/2019   AST 18 01/27/2019   NA 138 01/27/2019   K 3.9 01/27/2019   CL 102 01/27/2019   CREATININE 1.13 (H) 01/27/2019   BUN 19 01/27/2019   CO2 26 01/27/2019   TSH 1.477 12/18/2018   INR 1.0 01/27/2019   HGBA1C 5.8 (H) 01/27/2019    Len Childs, MD 01/31/2019 9:09 AM

## 2019-01-31 NOTE — Anesthesia Postprocedure Evaluation (Signed)
Anesthesia Post Note  Patient: Dana Pruitt  Procedure(s) Performed: AORTIC VALVE REPLACEMENT (AVR) USING 21 MM INSPIRIS RESILIS AORTIC VALVE. SN: [id number] (N/A Chest) TRANSESOPHAGEAL ECHOCARDIOGRAM (TEE) (N/A )     Patient location during evaluation: SICU Anesthesia Type: General Level of consciousness: sedated Pain management: pain level controlled Vital Signs Assessment: post-procedure vital signs reviewed and stable Respiratory status: patient remains intubated per anesthesia plan Cardiovascular status: stable Postop Assessment: no apparent nausea or vomiting Anesthetic complications: no    Last Vitals:  Vitals:   01/31/19 1530 01/31/19 1545  BP: 112/80 96/67  Pulse: 82 83  Resp: 12 12  Temp: (!) 36.2 C (!) 36.3 C  SpO2: 96% 95%    Last Pain:  Vitals:   01/31/19 1500  TempSrc: Core  PainSc:                  Tiajuana Amass

## 2019-01-31 NOTE — Progress Notes (Signed)
Phase 2 of wean started at 1709. Vent settings: SIMV, PS; FIO2 40%, PEEP 5, SIMV rate 4, vT 520, PS 10. RT and RN at bedside to monitor pt.

## 2019-01-31 NOTE — Progress Notes (Addendum)
RT assessed pt for readiness to wean to next phase of heart wean. Pt was able to hold head off of pillow for 20 secs +, squeeze RT hand, wiggle toes, and stick out tongue w/out difficulty. RT is guarded as when pt falls asleep she does not initiate breaths. Pt is currently on SIMV/PRVC/PSV w/VT 520, Rate 4, FIO2 40%, and PEEP 5. Pt tolerating at this time. Respiratory will reassess pt for next phase in 30 minutes. RT will continue to monitor.

## 2019-01-31 NOTE — Transfer of Care (Signed)
Immediate Anesthesia Transfer of Care Note  Patient: Dana Pruitt  Procedure(s) Performed: AORTIC VALVE REPLACEMENT (AVR) USING 21 MM INSPIRIS RESILIS AORTIC VALVE. SN: [id number] (N/A Chest) TRANSESOPHAGEAL ECHOCARDIOGRAM (TEE) (N/A )  Patient Location: SICU  Anesthesia Type:General  Level of Consciousness: Patient remains intubated per anesthesia plan  Airway & Oxygen Therapy: Patient remains intubated per anesthesia plan and Patient placed on Ventilator (see vital sign flow sheet for setting)  Post-op Assessment: Report given to RN and Post -op Vital signs reviewed and stable  Post vital signs: Reviewed and stable  Last Vitals:  Vitals Value Taken Time  BP    Temp    Pulse 91 01/31/19 1410  Resp 12 01/31/19 1410  SpO2 99 % 01/31/19 1410  Vitals shown include unvalidated device data.  Last Pain:  Vitals:   01/31/19 0646  TempSrc:   PainSc: 0-No pain      Patients Stated Pain Goal: 3 (60/04/59 9774)  Complications: No apparent anesthesia complications

## 2019-01-31 NOTE — Progress Notes (Signed)
TCTS Evening rounds  Day of surgery from AVR Hemodynamically stable Not bleeding Continue to resuscitate. Extubation.

## 2019-01-31 NOTE — Progress Notes (Addendum)
RT terminated pt heart wean d/t pt having frequent episodes of apnea. Rt will reassess and attempt heart wean again later on in the night. RN aware. RT will continue to monitor. Pt placed back on SIMV/PRVC/PSV.

## 2019-01-31 NOTE — Progress Notes (Signed)
  Echocardiogram Echocardiogram Transesophageal has been performed.  Darlina Sicilian M 01/31/2019, 9:20 AM

## 2019-01-31 NOTE — Anesthesia Procedure Notes (Signed)
Procedure Name: Intubation Date/Time: 01/31/2019 9:30 AM Performed by: Neldon Newport, CRNA Pre-anesthesia Checklist: Patient identified, Emergency Drugs available, Suction available and Patient being monitored Patient Re-evaluated:Patient Re-evaluated prior to induction Oxygen Delivery Method: Circle System Utilized Preoxygenation: Pre-oxygenation with 100% oxygen Induction Type: IV induction Ventilation: Mask ventilation without difficulty and Oral airway inserted - appropriate to patient size Laryngoscope Size: Mac and 3 Grade View: Grade I Tube type: Oral Tube size: 8.0 mm Number of attempts: 1 Airway Equipment and Method: Stylet and Oral airway Placement Confirmation: ETT inserted through vocal cords under direct vision,  positive ETCO2 and breath sounds checked- equal and bilateral Secured at: 22 cm Tube secured with: Tape Dental Injury: Teeth and Oropharynx as per pre-operative assessment

## 2019-01-31 NOTE — Progress Notes (Addendum)
Pt following commands at this time but needing a reminder to take a breath often. Attempted PS/CPAP 10/5 with 40%, Pt's RR <8. Wean was attempted and failed at 1731. RT and RN at bedside. Will attempt when pt is more awake. Pt is on full ventilator support at this time. Vitals are stable.

## 2019-01-31 NOTE — H&P (Signed)
PCP is Deland Pretty, MD Referring Provider is No ref. provider found  No chief complaint on file.   HPI: Patient presents after right and left heart cath and PFTs for discussion of aortic valve replacement for recent progression of aortic insufficiency.  She has been followed by 7 by her cardiologist for several years with serial echocardiograms.  Her most recent echo showed severe AI with some LV dilatation and reduction in LV systolic function.  Her coronaries are clean.  Right heart cath showed normal pressures with mixed venous saturation of 68%.  LVEDP is normal, cardiac index is 2.9, LV systolic pressure 053, systemic diastolic pressure 39.  PFTs show FEV1 1.2 [60%] and DLCO 40%- remote history of heavy smoking.  CT scan of the chest shows ascending aorta measures 4.0 cm.  There is panlobular emphysema noted on the lung windows of her cardiac CT scan.   Past Medical History:  Diagnosis Date  . Anemia    "several times over the years" (10/17/2012)  . Anxiety   . Breast cancer ([place]) 02/2010   right breast  . Carcinoma of breast treated with adjuvant hormone therapy ([place]) 9/11   [facility]  . CHF (congestive heart failure) ([place])   . COPD (chronic obstructive pulmonary disease) ([place])   . Depression   . Exertional shortness of breath    "recently" (10/17/2012)  . GERD (gastroesophageal reflux disease)    diet controlled, no med  . Hollenhorst plaque, left eye    apparently resolved at recent ophthalmology visit  . Hyperlipidemia   . Hypertension   . 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 Left ventricular dysfunction   . Moderate aortic insufficiency   . Pneumonia    "that's why I'm here" (10/17/2012)    Past Surgical History:  Procedure Laterality Date  . APPENDECTOMY  1974  . BREAST BIOPSY Right 02/27/10   Needle core Biopsy; Invasive Mammary; ER/PR Positive, Her-2 Neu negative, Ki-67 22%  . BREAST LUMPECTOMY WITH SENTINEL LYMPH NODE BIOPSY Right 07/03/11   Invasive  Ductal Carcinoma;0/3 nodes negative,; ER,PR Positive, Her-2 Neg; Ki-67 22%  . [place]; 1974  . COLONOSCOPY    . DILATION AND CURETTAGE OF UTERUS  1970's  . RIGHT/LEFT HEART CATH AND CORONARY ANGIOGRAPHY N/A 01/02/2019   Procedure: RIGHT/LEFT HEART CATH AND CORONARY ANGIOGRAPHY;  Surgeon: Larey Dresser, MD;  Location: [facility];  Service: Cardiovascular;  Laterality: N/A;  . TUBAL LIGATION  1974  . WISDOM TOOTH EXTRACTION      Family History  Problem Relation Age of Onset  . Heart disease Mother   . Cancer Father   . Heart disease Maternal Grandmother   . Heart disease Maternal Grandfather   . Cancer Paternal Grandmother   . Lung disease Neg Hx     Social History Social History   Tobacco Use  . Smoking status: Former Smoker    Packs/day: 3.00    Years: 30.00    Pack years: 90.00    Types: Cigarettes    Start date: 05/19/1958    Quit date: 06/30/1990    Years since quitting: 28.6  . Smokeless tobacco: Never Used  . Tobacco comment: Stopped smoking for 2-3 years during second child.   Substance Use Topics  . Alcohol use: Yes    Comment: 1-2 glasses wine/month  . Drug use: No    Current Facility-Administered Medications  Medication Dose Route Frequency Provider Last Rate Last Dose  . 0.9 % irrigation (POUR BTL)  PRN Prescott Gum, Collier Salina, MD   5,000 mL at 01/31/19 0741  . cefUROXime (ZINACEF) 1.5 g in sodium chloride 0.9 % 100 mL IVPB  1.5 g Intravenous To OR Prescott Gum, Collier Salina, MD      . cefUROXime (ZINACEF) 750 mg in sodium chloride 0.9 % 100 mL IVPB  750 mg Intravenous To OR Prescott Gum, Collier Salina, MD      . chlorhexidine (HIBICLENS) 4 % liquid 2 application  30 mL Topical UD Prescott Gum, Collier Salina, MD      . dexmedetomidine (PRECEDEX) 400 MCG/100ML (4 mcg/mL) infusion  0.1-0.7 mcg/kg/hr Intravenous To OR Prescott Gum, Collier Salina, MD      . DOPamine (INTROPIN) 800 mg in dextrose 5 % 250 mL (3.2 mg/mL) infusion  0-10 mcg/kg/min Intravenous To OR Prescott Gum, Collier Salina, MD       . EPINEPHrine (ADRENALIN) 4 mg in dextrose 5 % 250 mL (0.016 mg/mL) infusion  0-10 mcg/min Intravenous To OR Ivin Poot, MD      . hemostatic agents    PRN Prescott Gum, Collier Salina, MD   1 application at 06/30/30 [phone]  . heparin 2,500 Units, papaverine 30 mg in electrolyte-148 (PLASMALYTE-148) 500 mL irrigation   Irrigation To OR Prescott Gum, Collier Salina, MD      . heparin 30,000 units/NS 1000 mL solution for CELLSAVER   Other To OR Prescott Gum, Collier Salina, MD      . insulin regular, human (MYXREDLIN) 100 units/ 100 mL infusion   Intravenous To OR Prescott Gum, Collier Salina, MD      . lactated ringers infusion   Intravenous Continuous Suzette Battiest, MD      . magnesium sulfate (IV Push/IM) injection 40 mEq  40 mEq Other To OR Prescott Gum, Collier Salina, MD      . metoprolol tartrate (LOPRESSOR) tablet 12.5 mg  12.5 mg Oral Once Prescott Gum, Collier Salina, MD      . milrinone (PRIMACOR) 20 MG/100 ML (0.2 mg/mL) infusion  0.3 mcg/kg/min Intravenous To OR Prescott Gum, Collier Salina, MD      . nitroGLYCERIN 50 mg in dextrose 5 % 250 mL (0.2 mg/mL) infusion  2-200 mcg/min Intravenous To OR Prescott Gum, Collier Salina, MD      . norepinephrine (LEVOPHED) 28m in 2522mpremix infusion  0-40 mcg/min Intravenous To OR VaPrescott GumPeCollier SalinaMD      . phenylephrine (NEOSYNEPHRINE) 20-0.9 MG/250ML-% infusion  30-200 mcg/min Intravenous To OR VaPrescott GumPeCollier SalinaMD      . potassium chloride injection 80 mEq  80 mEq Other To OR VaPrescott GumPeCollier SalinaMD      . Surgifoam 1 Gm with 0.9% sodium chloride (4 ml) topical solution    PRN VaPrescott GumPeCollier SalinaMD   4 mL at 01/31/19 0739  . tranexamic acid (CYKLOKAPRON) 2,500 mg in sodium chloride 0.9 % 250 mL (10 mg/mL) infusion  1.5 mg/kg/hr Intravenous To OR VaPrescott GumPeCollier SalinaMD      . tranexamic acid (CYKLOKAPRON) bolus via infusion - over 30 minutes 825 mg  15 mg/kg Intravenous To OR VaPrescott GumPeCollier SalinaMD      . tranexamic acid (CYKLOKAPRON) pump prime solution 110 mg  2 mg/kg Intracatheter To OR VaPrescott GumPeCollier SalinaMD      . vancomycin  (VANCOCIN) 1,250 mg in sodium chloride 0.9 % 250 mL IVPB  1,250 mg Intravenous To OR VaIvin PootMD   1,250 mg at 01/31/19 073220 Facility-Administered Medications Ordered in Other Encounters  Medication Dose Route Frequency Provider Last  Rate Last Dose  . fentaNYL (SUBLIMAZE) injection   Intravenous Anesthesia Intra-op Neldon Newport, CRNA   50 mcg at 01/31/19 3142  . lactated ringers infusion    Continuous PRN Neldon Newport, CRNA      . lactated ringers infusion    Continuous PRN Neldon Newport, CRNA      . lactated ringers infusion    Continuous PRN Neldon Newport, CRNA      . midazolam (VERSED) 5 MG/5ML injection    Anesthesia Intra-op Neldon Newport, CRNA   1 mg at 01/31/19 0709  . sodium chloride flush (NS) 0.9 % injection 3 mL  3 mL Intravenous Q12H Larey Dresser, MD        No Known Allergies  Review of Systems  No problems with general anesthesia No bleeding diathesis No history of thoracic trauma or pneumothorax No history of edema syncope palpitations or presyncope  BP (!) 173/61   Pulse 64   Temp 97.8 F (36.6 C) (Oral)   Resp 18   Ht _0  (1.626 m)   Wt 55 kg   LMP  (LMP Unknown)   SpO2 95%   BMI 20.80 kg/m  Physical Exam      Exam    General- alert and comfortable    Neck- no JVD, no cervical adenopathy palpable, no carotid bruit   Lungs- clear without rales, wheezes   Cor- regular rate and rhythm, 2/6 AI murmur , no gallop   Abdomen- soft, non-tender   Extremities - warm, non-tender, minimal edema   Neuro- oriented, appropriate, no focal weakness   Diagnostic Tests: Images of cardiac CT and coronary angiograms personally reviewed and discussed with patient  Impression: Severe AI with early dilatation of LV and the decreased systolic function.  Agree with recommendation for AVR by patient cardiologist  Plan: Patient will need to have her dentist clean her teeth, check her teeth before scheduling her for elective AVR with a  bioprosthetic valve.  The details of the procedure including the use of general anesthesia cardiopulmonary bypass and the location of the surgical incision were all discussed with the patient.  She understands the risks and benefits as well. She will let us know after her teeth are cleaned when we can schedule the surgery.  Len Childs, MD [facility] [phone]

## 2019-01-31 NOTE — Brief Op Note (Signed)
01/31/2019  1:41 PM  PATIENT:  Dana Pruitt  78 y.o. female  PRE-OPERATIVE DIAGNOSIS:  SEVERE Aortic insufficiency   POST-OPERATIVE DIAGNOSIS:  SEVERE Aortic insufficiency  PROCEDURE:  Procedure(s): AORTIC VALVE REPLACEMENT (AVR) USING 21 MM INSPIRIS RESILIS AORTIC VALVE. SN: [id number] (N/A) TRANSESOPHAGEAL ECHOCARDIOGRAM (TEE) (N/A)  SURGEON:  Surgeon(s) and Role:    Ivin Poot, MD - Primary    * Lightfoot, Lucile Crater, MD - Assisting  PHYSICIAN ASSISTANT:   ASSISTANTS: Blima Singer   RNFA   ANESTHESIA:   general  EBL:  800 ml BLOOD ADMINISTERED2 units PRBC for preop anemia Hct 26%  DRAINS: anterior 78F Bard  and posterior 28 F angled mediastinal drains   LOCAL MEDICATIONS USED:  NONE  SPECIMEN:  aotiic valve leaflets excised  DISPOSITION OF SPECIMEN:  PATHOLOGY  COUNTS:  YES  TOURNIQUET:  * No tourniquets in log *  DICTATION: .Dragon Dictation  PLAN OF CARE: Admit to inpatient   PATIENT DISPOSITION:  ICU - intubated and hemodynamically stable.   Delay start of Pharmacological VTE agent (>24hrs) due to surgical blood loss or risk of bleeding: yes

## 2019-01-31 NOTE — Progress Notes (Signed)
RT assessed pt for readiness to advance to next stage of heart wean. Pt not initiating breaths while at rest, pt has to be stimulated continuously in order to initiate breaths. RT will reassess pt and determine if pt is able to progress to next phase of wean in 30 minttes. RT will continue to monitor.

## 2019-01-31 NOTE — Anesthesia Procedure Notes (Signed)
Arterial Line Insertion Start/End8/04/2019 7:05 AM, 01/31/2019 7:15 AM Performed by: Neldon Newport, CRNA, CRNA  Patient location: Pre-op. Preanesthetic checklist: patient identified, IV checked, site marked, risks and benefits discussed, surgical consent, monitors and equipment checked, pre-op evaluation, timeout performed and anesthesia consent Lidocaine 1% used for infiltration Left, radial was placed Catheter size: 20 Fr Hand hygiene performed  and maximum sterile barriers used  Allen's test indicative of satisfactory collateral circulation Attempts: 1 Procedure performed without using ultrasound guided technique. Following insertion, dressing applied and Biopatch. Post procedure assessment: normal and unchanged  Patient tolerated the procedure well with no immediate complications.

## 2019-01-31 NOTE — Op Note (Signed)
NAME: Dana Pruitt, AUBE MEDICAL RECORD [id number] ACCOUNT [id number] DATE OF BIRTH:1941/05/30 FACILITY: [facility] LOCATION: [facility] PHYSICIAN:PETER VAN TRIGT III, MD  OPERATIVE REPORT  DATE OF PROCEDURE:  01/31/2019  OPERATION: 1.  Aortic valve replacement with a 21 mm Inspiris Edwards valve. 2.  Placement of left femoral A line for blood pressure monitoring.  SURGEON:  Ivin Poot, MD  ASSISTANT:  Melodie Bouillon, MD  ANESTHESIA:  General by Dr. Rodman Comp.  PREOPERATIVE DIAGNOSIS:  Severe aortic insufficiency, moderate left ventricular dysfunction and left ventricular dilatation, history of smoking and breast cancer.  POSTOPERATIVE DIAGNOSIS:  Severe aortic insufficiency, moderate ventricular dysfunction and left ventricular dilatation, history of smoking and breast cancer.  CLINICAL NOTE:  The patient is a 78 year old female with COPD, history of breast cancer and progressive aortic insufficiency, which has recently become severe, associated with dilatation of the LV and reduced LV systolic function.  Her cardiologist  recommended aortic valve replacement.  Right and left heart catheterization were performed which showed normal coronary arteries and normal right-sided pressures.  Echo showed severe aortic insufficiency with a trileaflet aortic valve.  The ascending  aorta measures 3.9 cm in diameter.  After reviewing the catheterization and echo images and a CT scan and examining the patient, I agreed with the recommendation for aortic valve replacement.  I discussed the procedure of AVR in detail with the patient and her family including the  indications, benefits, alternatives and expected postoperative recovery.  She understood the risks of the surgery to include bleeding, blood transfusion, stroke, postoperative infection, postoperative organ failure, respiratory problems including  prolonged ventilator dependence and pleural effusion, and death.  After reviewing these  issues, she demonstrated her understanding and agreed to proceed with surgery under what I felt was informed consent for aortic valve replacement, which would provide  preservation of LV function and improved survival.  DESCRIPTION OF PROCEDURE:  The patient was brought from preop holding where informed consent was documented and final questions were addressed.  The patient was placed supine on the operating table and general anesthesia was induced under invasive  hemodynamic monitoring.  Pulmonary artery pressures were moderately elevated.  A transesophageal echo probe was placed by the anesthesia team, which confirmed the preoperative diagnosis of severe aortic insufficiency and LV dilatation.  The patient was  prepped and draped as a sterile field.  A proper time-out was performed.  A sternal incision was made.  The sternum was retracted.  The pericardium was opened.  The aorta was inspected.  She had some mild-moderate calcification in the area of the  sinotubular junction and above.  The aortic pursestring was placed in the ascending aorta in the area with minimal calcification.  Pursestrings were placed in the right atrium.  Heparin was administered and the ACT was documented as being therapeutic.  The patient was cannulated and  placed on bypass.  A left ventricular vent was placed via the right superior pulmonary vein.  Cardioplegia cannulas were placed both antegrade aortic and retrograde coronary sinus cardioplegia.  The patient was cooled to 32 degrees, and the aortic  crossclamp was applied.  One liter of cold blood cardioplegia was delivered manually through the retrograde coronary sinus catheter due to the patient's severe aortic insufficiency.  There was good cardioplegic arrest and supple temperature dropped less  than 14 degrees.  Cardioplegia was delivered every 20 minutes.  An aortotomy was performed.  The aortic valve was inspected.  It had 3 leaflets.  There is minimal  calcification.  The leaflets were thin and had some fenestration at the right and noncoronary cusps.  The leaflets were excised.  The annulus was sized to  a 21 mm Edwards valve.  Subannular 2-0 Ethibond pledgeted sutures were placed around the circumference numbering 14 sutures.  The Inspiris valve was brought to the field and the sutures were placed in the sewing ring and the valve was seated and sutures  tied.  There was good confirmation of the valve to the annulus without space for perivalvular leak.  There was no obstruction of the coronary ostia.  Aortotomy was closed in 2 layers using running 4-0 Prolene.  A dose of retrograde warm blood  cardioplegia was given to remove any air from the left side of the heart.  The patient's chest was also insufflated with CO2 during the period of the aortotomy.  The crossclamp was removed.  The heart resumed a spontaneous rhythm.  The aortotomy was hemostatic.  Temporary pacing wires were placed, and the patient was AV sequentially paced.  The patient was rewarmed and reperfused.  The lungs were expanded.  Ventilator was resumed.  The patient was weaned off cardiopulmonary bypass without difficulty.  The RV was somewhat hypocontractile and dilated and the patient was placed on low dose  dobutamine with improvement in RV function and cardiac output.  Protamine was administered without adverse reaction.  The cannulas were removed.  There is diffuse coagulopathy.  The patient had preoperative anemia and received 2 units of packed cells at  the beginning of cardiopulmonary bypass.  After considerable effort was made at hemostasis, the superior pericardial fat was closed over the aorta.  The anterior mediastinum and posterior mediastinal chest tubes were placed and brought out through separate incisions.  The sternum was closed with  wire.  The patient remained stable.  The pectoralis fascia was closed with a running #1 Vicryl.  Subcutaneous and skin layers were  closed with a running Vicryl and sterile dressings were applied.  The patient returned back to the ICU in stable  condition.  Cardiopulmonary bypass time was 98 minutes.  A chest x-ray taken in the operating room showed the lines and endotracheal tube in good position and no pneumothorax.  TN/NUANCE  D:01/31/2019 T:01/31/2019 JOB:[id number]

## 2019-02-01 ENCOUNTER — Encounter (HOSPITAL_COMMUNITY): Payer: Self-pay | Admitting: Cardiothoracic Surgery

## 2019-02-01 ENCOUNTER — Inpatient Hospital Stay (HOSPITAL_COMMUNITY): Payer: Medicare Other

## 2019-02-01 LAB — MAGNESIUM
Magnesium: 2.2 mg/dL (ref 1.7–2.4)
Magnesium: 2 mg/dL (ref 1.7–2.4)

## 2019-02-01 LAB — BASIC METABOLIC PANEL
Anion gap: 9 (ref 5–15)
Anion gap: 8 (ref 5–15)
BUN: 11 mg/dL (ref 8–23)
BUN: 15 mg/dL (ref 8–23)
CO2: 20 mmol/L — ABNORMAL LOW (ref 22–32)
CO2: 22 mmol/L (ref 22–32)
Calcium: 6.9 mg/dL — ABNORMAL LOW (ref 8.9–10.3)
Calcium: 6.6 mg/dL — ABNORMAL LOW (ref 8.9–10.3)
Chloride: 110 mmol/L (ref 98–111)
Chloride: 103 mmol/L (ref 98–111)
Creatinine, Ser: 0.99 mg/dL (ref 0.44–1.00)
Creatinine, Ser: 1.19 mg/dL — ABNORMAL HIGH (ref 0.44–1.00)
GFR calc Af Amer: >60 mL/min (ref >60)
GFR calc Af Amer: 51 mL/min — ABNORMAL LOW (ref >60)
GFR calc non Af Amer: 55 mL/min — ABNORMAL LOW (ref >60)
GFR calc non Af Amer: 44 mL/min — ABNORMAL LOW (ref >60)
Glucose, Bld: 133 mg/dL — ABNORMAL HIGH (ref 70–99)
Glucose, Bld: 173 mg/dL — ABNORMAL HIGH (ref 70–99)
Potassium: 4.2 mmol/L (ref 3.5–5.1)
Potassium: 4.2 mmol/L (ref 3.5–5.1)
Sodium: 139 mmol/L (ref 135–145)
Sodium: 133 mmol/L — ABNORMAL LOW (ref 135–145)

## 2019-02-01 LAB — CBC
HCT: 31.8 % — ABNORMAL LOW (ref 36.0–46.0)
HCT: 32.3 % — ABNORMAL LOW (ref 36.0–46.0)
HCT: 32.2 % — ABNORMAL LOW (ref 36.0–46.0)
Hemoglobin: 10.7 g/dL — ABNORMAL LOW (ref 12.0–15.0)
Hemoglobin: 10.8 g/dL — ABNORMAL LOW (ref 12.0–15.0)
Hemoglobin: 10.3 g/dL — ABNORMAL LOW (ref 12.0–15.0)
MCH: 30.8 pg (ref 26.0–34.0)
MCH: 30.8 pg (ref 26.0–34.0)
MCH: 30.5 pg (ref 26.0–34.0)
MCHC: 33.6 g/dL (ref 30.0–36.0)
MCHC: 33.4 g/dL (ref 30.0–36.0)
MCHC: 32 g/dL (ref 30.0–36.0)
MCV: 91.6 fL (ref 80.0–100.0)
MCV: 92 fL (ref 80.0–100.0)
MCV: 95.3 fL (ref 80.0–100.0)
Platelets: 93 10*3/uL — ABNORMAL LOW (ref 150–400)
Platelets: 104 10*3/uL — ABNORMAL LOW (ref 150–400)
Platelets: 105 10*3/uL — ABNORMAL LOW (ref 150–400)
RBC: 3.47 MIL/uL — ABNORMAL LOW (ref 3.87–5.11)
RBC: 3.51 MIL/uL — ABNORMAL LOW (ref 3.87–5.11)
RBC: 3.38 MIL/uL — ABNORMAL LOW (ref 3.87–5.11)
RDW: 15.2 % (ref 11.5–15.5)
RDW: 15.4 % (ref 11.5–15.5)
RDW: 15.9 % — ABNORMAL HIGH (ref 11.5–15.5)
WBC: 5.9 10*3/uL (ref 4.0–10.5)
WBC: 9.2 10*3/uL (ref 4.0–10.5)
WBC: 10.9 10*3/uL — ABNORMAL HIGH (ref 4.0–10.5)
nRBC: 0 % (ref 0.0–0.2)
nRBC: 0 % (ref 0.0–0.2)
nRBC: 0 % (ref 0.0–0.2)

## 2019-02-01 LAB — GLUCOSE, CAPILLARY
Glucose-Capillary: 104 mg/dL — ABNORMAL HIGH (ref 70–99)
Glucose-Capillary: 115 mg/dL — ABNORMAL HIGH (ref 70–99)
Glucose-Capillary: 156 mg/dL — ABNORMAL HIGH (ref 70–99)
Glucose-Capillary: 134 mg/dL — ABNORMAL HIGH (ref 70–99)
Glucose-Capillary: 125 mg/dL — ABNORMAL HIGH (ref 70–99)
Glucose-Capillary: 113 mg/dL — ABNORMAL HIGH (ref 70–99)
Glucose-Capillary: 101 mg/dL — ABNORMAL HIGH (ref 70–99)
Glucose-Capillary: 100 mg/dL — ABNORMAL HIGH (ref 70–99)
Glucose-Capillary: 104 mg/dL — ABNORMAL HIGH (ref 70–99)
Glucose-Capillary: 100 mg/dL — ABNORMAL HIGH (ref 70–99)
Glucose-Capillary: 153 mg/dL — ABNORMAL HIGH (ref 70–99)
Glucose-Capillary: 112 mg/dL — ABNORMAL HIGH (ref 70–99)
Glucose-Capillary: 140 mg/dL — ABNORMAL HIGH (ref 70–99)

## 2019-02-01 LAB — POCT I-STAT 7, (LYTES, BLD GAS, ICA,H+H)
Acid-base deficit: 5 mmol/L — ABNORMAL HIGH (ref 0.0–2.0)
Acid-base deficit: 4 mmol/L — ABNORMAL HIGH (ref 0.0–2.0)
Acid-base deficit: 5 mmol/L — ABNORMAL HIGH (ref 0.0–2.0)
Bicarbonate: 20.7 mmol/L (ref 20.0–28.0)
Bicarbonate: 22 mmol/L (ref 20.0–28.0)
Bicarbonate: 21.1 mmol/L (ref 20.0–28.0)
Calcium, Ion: 1.01 mmol/L — ABNORMAL LOW (ref 1.15–1.40)
Calcium, Ion: 1.02 mmol/L — ABNORMAL LOW (ref 1.15–1.40)
Calcium, Ion: 1 mmol/L — ABNORMAL LOW (ref 1.15–1.40)
HCT: 30 % — ABNORMAL LOW (ref 36.0–46.0)
HCT: 31 % — ABNORMAL LOW (ref 36.0–46.0)
HCT: 30 % — ABNORMAL LOW (ref 36.0–46.0)
Hemoglobin: 10.2 g/dL — ABNORMAL LOW (ref 12.0–15.0)
Hemoglobin: 10.5 g/dL — ABNORMAL LOW (ref 12.0–15.0)
Hemoglobin: 10.2 g/dL — ABNORMAL LOW (ref 12.0–15.0)
O2 Saturation: 90 %
O2 Saturation: 93 %
O2 Saturation: 89 %
Patient temperature: 36.4
Patient temperature: 36.6
Patient temperature: 36.4
Potassium: 4 mmol/L (ref 3.5–5.1)
Potassium: 4.2 mmol/L (ref 3.5–5.1)
Potassium: 4.3 mmol/L (ref 3.5–5.1)
Sodium: 140 mmol/L (ref 135–145)
Sodium: 140 mmol/L (ref 135–145)
Sodium: 141 mmol/L (ref 135–145)
TCO2: 22 mmol/L (ref 22–32)
TCO2: 23 mmol/L (ref 22–32)
TCO2: 22 mmol/L (ref 22–32)
pCO2 arterial: 36.7 mmHg (ref 32.0–48.0)
pCO2 arterial: 43.5 mmHg (ref 32.0–48.0)
pCO2 arterial: 41 mmHg (ref 32.0–48.0)
pH, Arterial: 7.356 (ref 7.350–7.450)
pH, Arterial: 7.31 — ABNORMAL LOW (ref 7.350–7.450)
pH, Arterial: 7.317 — ABNORMAL LOW (ref 7.350–7.450)
pO2, Arterial: 59 mmHg — ABNORMAL LOW (ref 83.0–108.0)
pO2, Arterial: 71 mmHg — ABNORMAL LOW (ref 83.0–108.0)
pO2, Arterial: 60 mmHg — ABNORMAL LOW (ref 83.0–108.0)

## 2019-02-01 MED ORDER — FUROSEMIDE 10 MG/ML IJ SOLN: 20.0000 mg | Freq: Two times a day (BID) | INTRAMUSCULAR | Status: DC

## 2019-02-01 MED ORDER — INSULIN ASPART 100 UNIT/ML ~~LOC~~ SOLN
0.0000 [IU] | SUBCUTANEOUS | Status: DC
  Administered 2019-02-01 (×2): 2 [IU] via SUBCUTANEOUS

## 2019-02-01 MED ORDER — FUROSEMIDE 10 MG/ML IJ SOLN
20.0000 mg | Freq: Two times a day (BID) | INTRAMUSCULAR | Status: DC
  Administered 2019-02-01: 20 mg via INTRAVENOUS
  Filled 2019-02-01: qty 2

## 2019-02-01 MED ORDER — SODIUM CHLORIDE 0.9 % IV SOLN
INTRAVENOUS | Status: DC | PRN
  Administered 2019-02-01: 250 mL via INTRAVENOUS

## 2019-02-01 MED ORDER — OXYCODONE HCL 5 MG PO TABS
5.0000 mg | ORAL_TABLET | Freq: Four times a day (QID) | ORAL | Status: DC | PRN
  Administered 2019-02-01 – 2019-02-02 (×2): 5 mg via ORAL
  Filled 2019-02-01 (×2): qty 1

## 2019-02-01 MED ORDER — LEVALBUTEROL HCL 1.25 MG/0.5ML IN NEBU
1.2500 mg | INHALATION_SOLUTION | Freq: Four times a day (QID) | RESPIRATORY_TRACT | Status: DC | PRN
  Administered 2019-02-02: 1.25 mg via RESPIRATORY_TRACT
  Filled 2019-02-01: qty 0.5

## 2019-02-01 MED ORDER — INSULIN ASPART 100 UNIT/ML ~~LOC~~ SOLN: 0.0000 [IU] | SUBCUTANEOUS | Status: DC

## 2019-02-01 MED ORDER — ORAL CARE MOUTH RINSE
15.0000 mL | Freq: Two times a day (BID) | OROMUCOSAL | Status: DC
  Administered 2019-02-01 – 2019-02-05 (×8): 15 mL via OROMUCOSAL

## 2019-02-01 MED ORDER — ALBUMIN HUMAN 25 % IV SOLN
12.5000 g | INTRAVENOUS | Status: AC
  Administered 2019-02-01 – 2019-02-02 (×4): 12.5 g via INTRAVENOUS
  Filled 2019-02-01 (×4): qty 50

## 2019-02-01 MED ORDER — FUROSEMIDE 10 MG/ML IJ SOLN
20.0000 mg | Freq: Four times a day (QID) | INTRAMUSCULAR | Status: DC
  Administered 2019-02-01: 20 mg via INTRAVENOUS
  Filled 2019-02-01: qty 2

## 2019-02-01 NOTE — Evaluation (Signed)
Physical Therapy Evaluation Patient Details Name: Dana Pruitt MRN: [id number] DOB: 1940/07/28 Today's Date: 02/01/2019   History of Present Illness  Pt admit for AVR.    Clinical Impression  Pt admitted with above diagnosis. Pt currently with functional limitations due to the deficits listed below (see PT Problem List). Pt was able to ambulate aroudn bed but was orthostatic therefore limited evaluation with pt symptomatic. Nurse made aware of orthostatic BPS.   Will contineu to progress as able.  Pt will benefit from skilled PT to increase their independence and safety with mobility to allow discharge to the venue listed below.    Orthostatic BPs  Supine 95/65  Sitting 80/60  Standing 65/50  Standing after 3 min Not able to get BP    Follow Up Recommendations Home health PT;Supervision/Assistance - 24 hour    Equipment Recommendations  Rolling walker with 5" wheels    Recommendations for Other Services       Precautions / Restrictions Precautions Precautions: Fall;Sternal Restrictions Weight Bearing Restrictions: Yes(sternal)      Mobility  Bed Mobility Overal bed mobility: Independent             General bed mobility comments: had to ask pt to slow down.  Once up on EOB, reported dizziness with BP drop to 80/60 from 95/65.   Transfers Overall transfer level: Needs assistance Equipment used: Rolling walker (2 wheeled) Transfers: Sit to/from Stand Sit to Stand: Min assist         General transfer comment: Assist to power up and for technique/sternal precautions  Ambulation/Gait Ambulation/Gait assistance: Min assist;+2 safety/equipment Gait Distance (Feet): 15 Feet Assistive device: Rolling walker (2 wheeled) Gait Pattern/deviations: Step-through pattern;Decreased stride length;Antalgic;Drifts right/left   Gait velocity interpretation: <1.31 ft/sec, indicative of household ambulator General Gait Details: Pt only walked around bed to recliner due to  dizziness.  BP once to chair 65/50 therefore raised pts LEs and BP to 80/55.  Nurse made aware the pt with orthostasis.  Pt felt ok once in chair and nurse wanted PT to leave her up.   Stairs            Wheelchair Mobility    Modified Rankin (Stroke Patients Only)       Balance Overall balance assessment: Needs assistance Sitting-balance support: No upper extremity supported;Feet supported Sitting balance-Leahy Scale: Fair     Standing balance support: Bilateral upper extremity supported;During functional activity Standing balance-Leahy Scale: Poor Standing balance comment: relies on UEs upprt for balance                             Pertinent Vitals/Pain Pain Assessment: Faces Faces Pain Scale: Hurts even more Pain Location: incision Pain Descriptors / Indicators: Grimacing;Guarding;Discomfort Pain Intervention(s): Limited activity within patient's tolerance;Monitored during session;Repositioned    Home Living Family/patient expects to be discharged to:: Private residence Living Arrangements: Spouse/significant other Available Help at Discharge: Family;Available 24 hours/day(supervsion only as he has back problems) Type of Home: House Home Access: Stairs to enter   [employer] of Steps: 2 Home Layout: One level Home Equipment: Shower seat - built in;Toilet riser      Prior Function Level of Independence: Independent               Hand Dominance        Extremity/Trunk Assessment   Upper Extremity Assessment Upper Extremity Assessment: Defer to OT evaluation    Lower Extremity Assessment Lower Extremity Assessment:  Generalized weakness    Cervical / Trunk Assessment Cervical / Trunk Assessment: Normal  Communication   Communication: No difficulties  Cognition Arousal/Alertness: Awake/alert Behavior During Therapy: WFL for tasks assessed/performed Overall Cognitive Status: Within Functional Limits for tasks assessed                                         General Comments General comments (skin integrity, edema, etc.): Orthostatics as above.  Other VSS on 5LO2 with sats >90%    Exercises General Exercises - Lower Extremity Ankle Circles/Pumps: AROM;Both;10 reps;Supine   Assessment/Plan    PT Assessment Patient needs continued PT services  PT Problem List Decreased activity tolerance;Decreased balance;Decreased mobility;Decreased knowledge of use of DME;Decreased safety awareness;Decreased knowledge of precautions;Pain       PT Treatment Interventions DME instruction;Gait training;Functional mobility training;Therapeutic activities;Therapeutic exercise;Balance training;Patient/family education    PT Goals (Current goals can be found in the Care Plan section)  Acute Rehab PT Goals Patient Stated Goal: to go home PT Goal Formulation: With patient Time For Goal Achievement: 02/15/19 Potential to Achieve Goals: Good    Frequency Min 3X/week   Barriers to discharge        Co-evaluation               AM-PAC PT "6 Clicks" Mobility  Outcome Measure Help needed turning from your back to your side while in a flat bed without using bedrails?: None Help needed moving from lying on your back to sitting on the side of a flat bed without using bedrails?: A Little Help needed moving to and from a bed to a chair (including a wheelchair)?: A Little Help needed standing up from a chair using your arms (e.g., wheelchair or bedside chair)?: A Little Help needed to walk in hospital room?: A Little Help needed climbing 3-5 steps with a railing? : A Little 6 Click Score: 19    End of Session Equipment Utilized During Treatment: Gait belt;Oxygen Activity Tolerance: Patient limited by fatigue;Patient limited by pain Patient left: in chair;with call bell/phone within reach Nurse Communication: Mobility status PT Visit Diagnosis: Unsteadiness on feet (R26.81);Muscle weakness (generalized)  (M62.81);Pain Pain - part of body: (incision)    Time: 7829-5621 PT Time Calculation (min) (ACUTE ONLY): 16 min   Charges:   PT Evaluation $PT Eval Moderate Complexity: [place] Pager:  [phone]  Office:  [phone]    Denice Paradise 02/01/2019, 2:19 PM

## 2019-02-01 NOTE — Procedures (Addendum)
Extubation Procedure Note  Patient Details:   Name: MANIE BEALER DOB: [birth year] MRN: [id number]   Airway Documentation:    Vent end date: 02/01/19 Vent end time: 0447   Evaluation  O2 sats: stable throughout Complications: No apparent complications Patient did tolerate procedure well. Bilateral Breath Sounds: Clear, Diminished   Yes  Pt suctioned prior to extubation, cuff leak present, Pt NIF -30 VC 750 mL, pt able to speak name, no stridor noted. Pt placed on [place] 6 Lpm humidified.    Roby Lofts Kacie Huxtable 02/01/2019, 10:22 PM

## 2019-02-01 NOTE — Progress Notes (Signed)
1 Day Post-Op Procedure(s) (LRB): AORTIC VALVE REPLACEMENT (AVR) USING 21 MM INSPIRIS RESILIS AORTIC VALVE. SN: [id number] (N/A) TRANSESOPHAGEAL ECHOCARDIOGRAM (TEE) (N/A) Subjective: Feels well  Objective: Vital signs in last 24 hours: Temp:  [95.9 F (35.5 C)-98.4 F (36.9 C)] 97.7 F (36.5 C) (08/12 0800) Pulse Rate:  [73-93] 81 (08/12 0800) Cardiac Rhythm: Normal sinus rhythm (08/12 0731) Resp:  [8-26] 17 (08/12 0800) BP: (89-173)/(60-104) 109/66 (08/12 0800) SpO2:  [89 %-98 %] 96 % (08/12 0809) Arterial Line BP: (102-275)/(48-243) 131/62 (08/12 0700) FiO2 (%):  [40 %-50 %] 40 % (08/12 0345) Weight:  [61.1 kg] 61.1 kg (08/12 0500)  Hemodynamic parameters for last 24 hours: PAP: (25-47)/(14-27) 43/20 CO:  [1.7 L/min-4.5 L/min] 4.2 L/min CI:  [1.1 L/min/m2-2.8 L/min/m2] 2.7 L/min/m2  Intake/Output from previous day: 08/11 0701 - 08/12 0700 In: 6280.4 [I.V.:3482.5; Blood:986; IV Piggyback:1811.9] Out: 4885 [QRFXJ:8832; Blood:1200; Chest Tube:290] Intake/Output this shift: Total I/O In: -  Out: 32 [Urine:32]       Exam    General- alert and comfortable    Neck- no JVD, no cervical adenopathy palpable, no carotid bruit   Lungs- clear without rales, wheezes   Cor- regular rate and rhythm, no murmur , gallop   Abdomen- soft, non-tender   Extremities - warm, non-tender, minimal edema   Neuro- oriented, appropriate, no focal weakness   Lab Results: Recent Labs    01/31/19 2032 02/01/19 0409 02/01/19 0435 02/01/19 0832  WBC 7.8 9.2  --   --   HGB 10.6* 10.8* 10.2* 10.2*  HCT 31.6* 32.3* 30.0* 30.0*  PLT 97* 104*  --   --    BMET:  Recent Labs    01/31/19 2032 02/01/19 0409 02/01/19 0435 02/01/19 0832  NA 139 139 140 141  K 3.7 4.2 4.0 4.3  CL 110 110  --   --   CO2 21* 20*  --   --   GLUCOSE 165* 133*  --   --   BUN 12 11  --   --   CREATININE 0.92 0.99  --   --   CALCIUM 7.0* 6.9*  --   --     PT/INR:  Recent Labs    01/31/19 1420  LABPROT 18.4*   INR 1.6*   ABG    Component Value Date/Time   PHART 7.317 (L) 02/01/2019 0832   HCO3 21.1 02/01/2019 0832   TCO2 22 02/01/2019 0832   ACIDBASEDEF 5.0 (H) 02/01/2019 0832   O2SAT 89.0 02/01/2019 0832   CBG (last 3)  Recent Labs    02/01/19 0621 02/01/19 0729 02/01/19 0830  GLUCAP 100* 104* 100*    Assessment/Plan: S/P Procedure(s) (LRB): AORTIC VALVE REPLACEMENT (AVR) USING 21 MM INSPIRIS RESILIS AORTIC VALVE. SN: [id number] (N/A) TRANSESOPHAGEAL ECHOCARDIOGRAM (TEE) (N/A) Mobilize Diuresis See progression orders Cont dobutamine for diuresis- wt up 10 lb  LOS: 1 day    [name] 02/01/2019

## 2019-02-01 NOTE — Progress Notes (Signed)
RT assessed pt for readiness to wean on heart protocol. Pt able to hold head up for 20+ sec, stick out tongue, wiggle toes,  and squeeze RTs hand. Pt a lot more alert and awake at this time. RT will reassess again in 30 minutes for continuation of next step of heart wean protocol. RT will continue to monitor.

## 2019-02-01 NOTE — Progress Notes (Signed)
[facility] BRIEF SICU PROGRESS NOTE  1 Day Post-Op  S/P Procedure(s) (LRB): AORTIC VALVE REPLACEMENT (AVR) USING 21 MM INSPIRIS RESILIS AORTIC VALVE. SN: [id number] (N/A) TRANSESOPHAGEAL ECHOCARDIOGRAM (TEE) (N/A)   Stable day NSR w/ stable BP Breathing comfortably w/ O2 sats 94-96% UOP adequate  Plan: Continue current plan  Rexene Alberts, MD 02/01/2019 5:39 PM

## 2019-02-01 NOTE — Progress Notes (Signed)
RN obtained ABG with the following results to establish pts readiness for liberation from the vent. RT will continue to monitor.    Ph 7.356 PCO2  36.7 PaO2 59 HCO3 20.7

## 2019-02-01 NOTE — Progress Notes (Signed)
RT assessed pt for next phase of heart wean and pt was able to hold head off of bed 20+ sec, stick out her tongue, wiggle toes, and squeeze RTs hand upon request. Pt was switched over to CPAP/PSV 10/5 and FIO2 40%. Pt tolerating well. RN will obtain ABG after 30 minutes. RT will reassess for pts ability to be liberated from the vent at that time after VC and NIF are measured. RT will continue monitor.

## 2019-02-01 NOTE — Addendum Note (Signed)
Addendum  created 02/01/19 1013 by Suzette Battiest, MD   Order list changed

## 2019-02-01 NOTE — Addendum Note (Signed)
Addendum  created 02/01/19 1154 by Donnella Bi, RN   Order list changed

## 2019-02-02 ENCOUNTER — Inpatient Hospital Stay (HOSPITAL_COMMUNITY): Payer: Medicare Other

## 2019-02-02 LAB — COOXEMETRY PANEL
Carboxyhemoglobin: 1 % (ref 0.5–1.5)
Methemoglobin: 0.9 % (ref 0.0–1.5)
O2 Saturation: 63.6 %
Total hemoglobin: 9.4 g/dL — ABNORMAL LOW (ref 12.0–16.0)

## 2019-02-02 LAB — GLUCOSE, CAPILLARY
Glucose-Capillary: 105 mg/dL — ABNORMAL HIGH (ref 70–99)
Glucose-Capillary: 107 mg/dL — ABNORMAL HIGH (ref 70–99)
Glucose-Capillary: 112 mg/dL — ABNORMAL HIGH (ref 70–99)
Glucose-Capillary: 116 mg/dL — ABNORMAL HIGH (ref 70–99)
Glucose-Capillary: 117 mg/dL — ABNORMAL HIGH (ref 70–99)
Glucose-Capillary: 119 mg/dL — ABNORMAL HIGH (ref 70–99)
Glucose-Capillary: 123 mg/dL — ABNORMAL HIGH (ref 70–99)
Glucose-Capillary: 124 mg/dL — ABNORMAL HIGH (ref 70–99)
Glucose-Capillary: 130 mg/dL — ABNORMAL HIGH (ref 70–99)
Glucose-Capillary: 147 mg/dL — ABNORMAL HIGH (ref 70–99)
Glucose-Capillary: 90 mg/dL (ref 70–99)

## 2019-02-02 LAB — BASIC METABOLIC PANEL
Anion gap: 10 (ref 5–15)
BUN: 15 mg/dL (ref 8–23)
CO2: 23 mmol/L (ref 22–32)
Calcium: 6.8 mg/dL — ABNORMAL LOW (ref 8.9–10.3)
Chloride: 101 mmol/L (ref 98–111)
Creatinine, Ser: 1.05 mg/dL — ABNORMAL HIGH (ref 0.44–1.00)
GFR calc Af Amer: 60 mL/min — ABNORMAL LOW (ref >60)
GFR calc non Af Amer: 52 mL/min — ABNORMAL LOW (ref >60)
Glucose, Bld: 94 mg/dL (ref 70–99)
Potassium: 3.8 mmol/L (ref 3.5–5.1)
Sodium: 134 mmol/L — ABNORMAL LOW (ref 135–145)

## 2019-02-02 LAB — CBC
HCT: 29.1 % — ABNORMAL LOW (ref 36.0–46.0)
Hemoglobin: 9.3 g/dL — ABNORMAL LOW (ref 12.0–15.0)
MCH: 31.2 pg (ref 26.0–34.0)
MCHC: 32 g/dL (ref 30.0–36.0)
MCV: 97.7 fL (ref 80.0–100.0)
Platelets: 95 10*3/uL — ABNORMAL LOW (ref 150–400)
RBC: 2.98 MIL/uL — ABNORMAL LOW (ref 3.87–5.11)
RDW: 15.8 % — ABNORMAL HIGH (ref 11.5–15.5)
WBC: 8.3 10*3/uL (ref 4.0–10.5)
nRBC: 0.2 % (ref 0.0–0.2)

## 2019-02-02 MED ORDER — GUAIFENESIN ER 600 MG PO TB12
600.0000 mg | ORAL_TABLET | Freq: Two times a day (BID) | ORAL | Status: DC
  Administered 2019-02-02 – 2019-02-07 (×11): 600 mg via ORAL
  Filled 2019-02-02 (×11): qty 1

## 2019-02-02 MED ORDER — SODIUM CHLORIDE 0.9 % IV SOLN
1.0000 g | Freq: Once | INTRAVENOUS | Status: AC
  Administered 2019-02-02: 1 g via INTRAVENOUS
  Filled 2019-02-02: qty 10

## 2019-02-02 MED ORDER — SODIUM CHLORIDE 0.9% FLUSH: 3.0000 mL | INTRAVENOUS | Status: DC | PRN

## 2019-02-02 MED ORDER — FUROSEMIDE 10 MG/ML IJ SOLN: 40.0000 mg | Freq: Every day | INTRAMUSCULAR | Status: DC

## 2019-02-02 MED ORDER — SODIUM CHLORIDE 0.9% FLUSH
3.0000 mL | Freq: Two times a day (BID) | INTRAVENOUS | Status: DC
  Administered 2019-02-02 – 2019-02-04 (×3): 3 mL via INTRAVENOUS

## 2019-02-02 MED ORDER — ONDANSETRON HCL 4 MG/2ML IJ SOLN
4.0000 mg | Freq: Four times a day (QID) | INTRAMUSCULAR | Status: DC | PRN
  Administered 2019-02-02: 4 mg via INTRAVENOUS
  Filled 2019-02-02: qty 2

## 2019-02-02 MED ORDER — POTASSIUM CHLORIDE CRYS ER 20 MEQ PO TBCR
20.0000 meq | EXTENDED_RELEASE_TABLET | Freq: Every day | ORAL | Status: DC
  Administered 2019-02-02 – 2019-02-03 (×2): 20 meq via ORAL
  Filled 2019-02-02 (×2): qty 1

## 2019-02-02 MED ORDER — CHLORHEXIDINE GLUCONATE CLOTH 2 % EX PADS
6.0000 | MEDICATED_PAD | Freq: Every day | CUTANEOUS | Status: DC
  Administered 2019-02-02 – 2019-02-07 (×5): 6 via TOPICAL

## 2019-02-02 MED ORDER — MIDODRINE HCL 5 MG PO TABS
5.0000 mg | ORAL_TABLET | Freq: Three times a day (TID) | ORAL | Status: DC
  Administered 2019-02-02 – 2019-02-04 (×7): 5 mg via ORAL
  Filled 2019-02-02 (×10): qty 1

## 2019-02-02 MED ORDER — FUROSEMIDE 20 MG PO TABS
20.0000 mg | ORAL_TABLET | Freq: Every day | ORAL | Status: DC
  Administered 2019-02-02: 20 mg via ORAL
  Filled 2019-02-02: qty 1

## 2019-02-02 MED ORDER — MOVING RIGHT ALONG BOOK
Freq: Once | Status: AC
  Administered 2019-02-02: 10:00:00
  Filled 2019-02-02: qty 1

## 2019-02-02 MED ORDER — INSULIN ASPART 100 UNIT/ML ~~LOC~~ SOLN
0.0000 [IU] | Freq: Three times a day (TID) | SUBCUTANEOUS | Status: DC
  Administered 2019-02-02 – 2019-02-03 (×4): 2 [IU] via SUBCUTANEOUS

## 2019-02-02 MED ORDER — SODIUM CHLORIDE 0.9 % IV SOLN: 250.0000 mL | INTRAVENOUS | Status: DC | PRN

## 2019-02-02 MED ORDER — MAGNESIUM HYDROXIDE 400 MG/5ML PO SUSP: 30.0000 mL | Freq: Every day | ORAL | Status: DC | PRN

## 2019-02-02 MED ORDER — FUROSEMIDE 10 MG/ML IJ SOLN
40.0000 mg | Freq: Once | INTRAMUSCULAR | Status: AC
  Administered 2019-02-02: 40 mg via INTRAVENOUS
  Filled 2019-02-02: qty 4

## 2019-02-02 MED ORDER — PHENYLEPHRINE HCL-NACL 10-0.9 MG/250ML-% IV SOLN
INTRAVENOUS | Status: AC
  Filled 2019-02-02: qty 250

## 2019-02-02 MED ORDER — ONDANSETRON HCL 4 MG PO TABS: 4.0000 mg | ORAL_TABLET | Freq: Four times a day (QID) | ORAL | Status: DC | PRN

## 2019-02-02 MED ORDER — ALBUMIN HUMAN 25 % IV SOLN
12.5000 g | Freq: Once | INTRAVENOUS | Status: AC
  Administered 2019-02-02: 12.5 g via INTRAVENOUS
  Filled 2019-02-02: qty 50

## 2019-02-02 MED ORDER — ALUM & MAG HYDROXIDE-SIMETH 200-200-20 MG/5ML PO SUSP: 15.0000 mL | ORAL | Status: DC | PRN

## 2019-02-02 NOTE — Progress Notes (Signed)
Called to patients room patient requesting PRN neb treatment.  Assessment complete no wheezing heard.  RT gave TX for shortness of breath.

## 2019-02-02 NOTE — Progress Notes (Signed)
EVENING ROUNDS NOTE :     [address]       [place] [address]             [phone]                 2 Days Post-Op Procedure(s) (LRB): AORTIC VALVE REPLACEMENT (AVR) USING 21 MM INSPIRIS RESILIS AORTIC VALVE. SN: [id number] (N/A) TRANSESOPHAGEAL ECHOCARDIOGRAM (TEE) (N/A)   Total Length of Stay:  LOS: 2 days  Events:  Up to chair doing well    BP 116/71 (BP Location: Right Arm)   Pulse 85   Temp 98.1 F (36.7 C) (Oral)   Resp 20   Ht 5\' 4"  (1.626 m)   Wt 59.7 kg   LMP  (LMP Unknown)   SpO2 92%   BMI 22.59 kg/m         . sodium chloride Stopped (02/02/19 1137)  . sodium chloride      I/O last 3 completed shifts: In: 2180.2 [P.O.:720; I.V.:650.1; IV Piggyback:810.1] Out: 3100 [Urine:2880; Chest Tube:220]   CBC Latest Ref Rng & Units 02/02/2019 02/01/2019 02/01/2019  WBC 4.0 - 10.5 K/uL 8.3 10.9(H) -  Hemoglobin 12.0 - 15.0 g/dL 9.3(L) 10.3(L) 10.2(L)  Hematocrit 36.0 - 46.0 % 29.1(L) 32.2(L) 30.0(L)  Platelets 150 - 400 K/uL 95(L) 105(L) -    BMP Latest Ref Rng & Units 02/02/2019 02/01/2019 02/01/2019  Glucose 70 - 99 mg/dL 94 173(H) -  BUN 8 - 23 mg/dL 15 15 -  Creatinine 0.44 - 1.00 mg/dL 1.05(H) 1.19(H) -  BUN/Creat Ratio 11 - 26 - - -  Sodium 135 - 145 mmol/L 134(L) 133(L) 141  Potassium 3.5 - 5.1 mmol/L 3.8 4.2 4.3  Chloride 98 - 111 mmol/L 101 103 -  CO2 22 - 32 mmol/L 23 22 -  Calcium 8.9 - 10.3 mg/dL 6.8(L) 6.6(L) -    ABG    Component Value Date/Time   PHART 7.317 (L) 02/01/2019 0832   PCO2ART 41.0 02/01/2019 0832   PO2ART 60.0 (L) 02/01/2019 0832   HCO3 21.1 02/01/2019 0832   TCO2 22 02/01/2019 0832   ACIDBASEDEF 5.0 (H) 02/01/2019 0832   O2SAT 63.6 02/02/2019 0405   S/p AVR. Doing well    Melodie Bouillon, MD 02/02/2019 4:46 PM

## 2019-02-02 NOTE — Progress Notes (Addendum)
T[facility] DAILY ICU PROGRESS NOTE                   [address]            [place], [address]          [phone]   2 Days Post-Op Procedure(s) (LRB): AORTIC VALVE REPLACEMENT (AVR) USING 21 MM INSPIRIS RESILIS AORTIC VALVE. SN: [id number] (N/A) TRANSESOPHAGEAL ECHOCARDIOGRAM (TEE) (N/A)  Total Length of Stay:  LOS: 2 days   Subjective: Feels pretty well  Objective: Vital signs in last 24 hours: Temp:  [97.3 F (36.3 C)-98.3 F (36.8 C)] 98 F (36.7 C) (08/13 0300) Pulse Rate:  [69-93] 88 (08/13 0700) Cardiac Rhythm: Normal sinus rhythm (08/13 0400) Resp:  [13-22] 16 (08/13 0700) BP: (65-123)/(50-76) 96/58 (08/13 0700) SpO2:  [92 %-99 %] 94 % (08/13 0700) Arterial Line BP: (134-144)/(59-70) 143/70 (08/12 1000) Weight:  [59.7 kg] 59.7 kg (08/13 0500)  Filed Weights   01/31/19 0646 02/01/19 0500 02/02/19 0500  Weight: 55 kg 61.1 kg 59.7 kg    Weight change: -1.4 kg   Hemodynamic parameters for last 24 hours: PAP: (41)/(18) 41/18  Intake/Output from previous day: 08/12 0701 - 08/13 0700 In: 1235.4 [P.O.:720; I.V.:178; IV Piggyback:337.3] Out: 1000 [Urine:920; Chest Tube:80]  Intake/Output this shift: No intake/output data recorded.  Current Meds: Scheduled Meds:  acetaminophen  1,000 mg Oral Q6H   Or   acetaminophen (TYLENOL) oral liquid 160 mg/5 mL  1,000 mg Per Tube Q6H   aspirin EC  325 mg Oral Daily   Or   aspirin  324 mg Per Tube Daily   atorvastatin  20 mg Oral q1800   bisacodyl  10 mg Oral Daily   Or   bisacodyl  10 mg Rectal Daily   Chlorhexidine Gluconate Cloth  6 each Topical Daily   docusate sodium  200 mg Oral Daily   furosemide  20 mg Intravenous BID   insulin aspart  0-24 Units Subcutaneous Q4H   mouth rinse  15 mL Mouth Rinse BID   pantoprazole  40 mg Oral Daily   sertraline  100 mg Oral QHS   sodium chloride flush  3 mL Intravenous Q12H   Continuous Infusions:  sodium chloride 10 mL/hr at 02/02/19 0600    DOBUTamine 3 mcg/kg/min (02/02/19 0600)   lactated ringers     lactated ringers 10 mL/hr at 02/02/19 0600   phenylephrine (NEO-SYNEPHRINE) Adult infusion Stopped (01/31/19 1524)   PRN Meds:.sodium chloride, calcium chloride, levalbuterol, metoprolol tartrate, morphine injection, ondansetron (ZOFRAN) IV, oxyCODONE, sodium chloride flush, traMADol  General appearance: alert, cooperative and no distress Heart: regular rate and rhythm and no murmur or rub Lungs: min dim in bases Abdomen: soft, non-tender Extremities: no edema Wound: dressings intavt  Lab Results: CBC: Recent Labs    02/01/19 1719 02/02/19 0400  WBC 10.9* 8.3  HGB 10.3* 9.3*  HCT 32.2* 29.1*  PLT 105* 95*   BMET:  Recent Labs    02/01/19 1719 02/02/19 0400  NA 133* 134*  K 4.2 3.8  CL 103 101  CO2 22 23  GLUCOSE 173* 94  BUN 15 15  CREATININE 1.19* 1.05*  CALCIUM 6.6* 6.8*    CMET: Lab Results  Component Value Date   WBC 8.3 02/02/2019   HGB 9.3 (L) 02/02/2019   HCT 29.1 (L) 02/02/2019   PLT 95 (L) 02/02/2019   GLUCOSE 94 02/02/2019   CHOL 140 12/17/2018   TRIG 103 12/17/2018   HDL  38 (L) 12/17/2018   LDLCALC 81 12/17/2018   ALT 16 01/27/2019   AST 18 01/27/2019   NA 134 (L) 02/02/2019   K 3.8 02/02/2019   CL 101 02/02/2019   CREATININE 1.05 (H) 02/02/2019   BUN 15 02/02/2019   CO2 23 02/02/2019   TSH 1.477 12/18/2018   INR 1.6 (H) 01/31/2019   HGBA1C 5.8 (H) 01/27/2019      PT/INR:  Recent Labs    01/31/19 1420  LABPROT 18.4*  INR 1.6*   Radiology: Dg Chest Port 1 View  Result Date: 02/02/2019 CLINICAL DATA:  Chest pain.  Status post aortic valve replacement EXAM: PORTABLE CHEST 1 VIEW COMPARISON:  February 01, 2019 FINDINGS: Swan-Ganz catheter has been removed. Cordis tip is in the superior vena cava. Temporary pacemaker wires are attached to the right heart. No evident pneumothorax. There is airspace consolidation in the left lower lobe. There are small pleural effusions  bilaterally. The lungs elsewhere are clear. Heart is borderline prominent with pulmonary vascularity normal. There is aortic atherosclerosis. No bone lesions. IMPRESSION: Cordis tip in superior vena cava. No pneumothorax. Small pleural effusions bilaterally. Left lower lobe consolidation, likely due primarily to atelectasis but potentially with superimposed degree of pneumonia or possibly aspiration. Stable cardiac silhouette. Status post aortic valve replacement. Aortic Atherosclerosis (ICD10-I70.0). Electronically Signed   By: Lowella Grip III M.D.   On: 02/02/2019 07:19     Assessment/Plan: S/P Procedure(s) (LRB): AORTIC VALVE REPLACEMENT (AVR) USING 21 MM INSPIRIS RESILIS AORTIC VALVE. SN: [id number] (N/A) TRANSESOPHAGEAL ECHOCARDIOGRAM (TEE) (N/A)  1 doing well POD#2 AVR 2 BP a little soft, some short episodes of SVT, will d/c dobutamine as may be a little vasodilated. Coox is 63.6 2 diuresing well, renal fxn normal, cont lasix 3 Acute blood loss anemia, H/H down a little further- monitor, not in transfusion threshold 4 thrombocytopenia, probably reactive, 95K- monitor 5 no leukocytosis or fevers 6 CXR with small effus, atx- cont pulm toilet and diuretics 7 routine cardiac rehab 8 tx to 4e later today    John Giovanni PA-C 02/02/2019 7:23 AM  Pager [phone]  BP soft, Cardiac output good,  wean dobutamine and start po midodrine Tx to stepdown if BP maintained > 90 SAP off dobutamine CXR with mild L basilar atelectasis  patient examined and medical record reviewed,agree with above note. [name] 02/02/2019

## 2019-02-02 NOTE — Progress Notes (Signed)
Physical Therapy Treatment Patient Details Name: Dana Pruitt MRN: [id number] DOB: Oct 22, 1940 Today's Date: 02/02/2019    History of Present Illness Pt admit for AVR.      PT Comments    Pt admitted with above diagnosis. Pt currently with functional limitations due to the deficits listed below (see PT Problem List). Pt was able to ambulate with Harmon Pier walker needing cues and assist to steer Harmon Pier walker.  Pt progressed with distance and was not dizzy today.  Did need 6LO2 to maintain sats >90%.  Will continue to follow acutely.   Pt will benefit from skilled PT to increase their independence and safety with mobility to allow discharge to the venue listed below.     Follow Up Recommendations  Home health PT;Supervision/Assistance - 24 hour     Equipment Recommendations  Rolling walker with 5" wheels    Recommendations for Other Services       Precautions / Restrictions Precautions Precautions: Fall;Sternal Restrictions Weight Bearing Restrictions: Yes(Sternal precautions) RUE Weight Bearing: Non weight bearing(Sternal Precautions ) LUE Weight Bearing: Non weight bearing(Sternal Precuations)    Mobility  Bed Mobility Overal bed mobility: Independent                Transfers Overall transfer level: Needs assistance Equipment used: Rolling walker (2 wheeled) Transfers: Sit to/from Stand Sit to Stand: Min guard         General transfer comment: Cues for technique/sternal precautions  Ambulation/Gait Ambulation/Gait assistance: Min Web designer (Feet): 220 Feet Assistive device: (Eva walker) Gait Pattern/deviations: Step-through pattern;Decreased stride length;Antalgic;Drifts right/left   Gait velocity interpretation: <1.31 ft/sec, indicative of household ambulator General Gait Details: Pt up walking with nursing on arrival just out of room.  PT took over and pt walked with Harmon Pier walker with cues and assist to steer Harmon Pier walker.  Pt was initialy on 2LO2 but  needed 6LO2 to maintain sats >90%.  Will continue to follow pt . Also needed cues for pursed lip breathing.    Stairs             Wheelchair Mobility    Modified Rankin (Stroke Patients Only)       Balance Overall balance assessment: Needs assistance Sitting-balance support: No upper extremity supported;Feet supported Sitting balance-Leahy Scale: Fair     Standing balance support: Bilateral upper extremity supported;During functional activity Standing balance-Leahy Scale: Poor Standing balance comment: relies on UE support for balance                            Cognition Arousal/Alertness: Awake/alert Behavior During Therapy: WFL for tasks assessed/performed Overall Cognitive Status: Within Functional Limits for tasks assessed                                        Exercises General Exercises - Lower Extremity Ankle Circles/Pumps: AROM;Both;10 reps;Supine Long Arc Quad: AROM;Both;10 reps;Seated    General Comments General comments (skin integrity, edema, etc.): No dizziness per pt report today.       Pertinent Vitals/Pain Pain Assessment: Faces Faces Pain Scale: Hurts little more Pain Location: incision Pain Descriptors / Indicators: Grimacing;Guarding;Discomfort Pain Intervention(s): Limited activity within patient's tolerance;Monitored during session;Repositioned    Home Living                      Prior Function  PT Goals (current goals can now be found in the care plan section) Acute Rehab PT Goals Patient Stated Goal: to go home Progress towards PT goals: Progressing toward goals    Frequency    Min 3X/week      PT Plan Current plan remains appropriate    Co-evaluation              AM-PAC PT "6 Clicks" Mobility   Outcome Measure  Help needed turning from your back to your side while in a flat bed without using bedrails?: None Help needed moving from lying on your back to sitting on  the side of a flat bed without using bedrails?: None Help needed moving to and from a bed to a chair (including a wheelchair)?: A Little Help needed standing up from a chair using your arms (e.g., wheelchair or bedside chair)?: A Little Help needed to walk in hospital room?: A Little Help needed climbing 3-5 steps with a railing? : A Little 6 Click Score: 20    End of Session Equipment Utilized During Treatment: Gait belt;Oxygen Activity Tolerance: Patient limited by fatigue Patient left: with call bell/phone within reach;in bed;with bed alarm set(Set up pt for lunch) Nurse Communication: Mobility status PT Visit Diagnosis: Unsteadiness on feet (R26.81);Muscle weakness (generalized) (M62.81);Pain Pain - part of body: (incision)     Time: 0258-5277 PT Time Calculation (min) (ACUTE ONLY): 15 min  Charges:  $Gait Training: 8-22 mins                     [place] Pager:  [phone]  Office:  [place] 02/02/2019, 2:01 PM

## 2019-02-03 ENCOUNTER — Inpatient Hospital Stay (HOSPITAL_COMMUNITY): Payer: Medicare Other

## 2019-02-03 DIAGNOSIS — J90 Pleural effusion, not elsewhere classified: Secondary | ICD-10-CM | POA: Diagnosis not present

## 2019-02-03 DIAGNOSIS — R918 Other nonspecific abnormal finding of lung field: Secondary | ICD-10-CM | POA: Diagnosis not present

## 2019-02-03 LAB — BASIC METABOLIC PANEL
Anion gap: 13 (ref 5–15)
BUN: 22 mg/dL (ref 8–23)
CO2: 20 mmol/L — ABNORMAL LOW (ref 22–32)
Calcium: 7.5 mg/dL — ABNORMAL LOW (ref 8.9–10.3)
Chloride: 99 mmol/L (ref 98–111)
Creatinine, Ser: 1.07 mg/dL — ABNORMAL HIGH (ref 0.44–1.00)
GFR calc Af Amer: 58 mL/min — ABNORMAL LOW (ref >60)
GFR calc non Af Amer: 50 mL/min — ABNORMAL LOW (ref >60)
Glucose, Bld: 115 mg/dL — ABNORMAL HIGH (ref 70–99)
Potassium: 4.1 mmol/L (ref 3.5–5.1)
Sodium: 132 mmol/L — ABNORMAL LOW (ref 135–145)

## 2019-02-03 LAB — GLUCOSE, CAPILLARY
Glucose-Capillary: 103 mg/dL — ABNORMAL HIGH (ref 70–99)
Glucose-Capillary: 135 mg/dL — ABNORMAL HIGH (ref 70–99)
Glucose-Capillary: 101 mg/dL — ABNORMAL HIGH (ref 70–99)
Glucose-Capillary: 130 mg/dL — ABNORMAL HIGH (ref 70–99)

## 2019-02-03 LAB — CBC
HCT: 33.2 % — ABNORMAL LOW (ref 36.0–46.0)
Hemoglobin: 10.7 g/dL — ABNORMAL LOW (ref 12.0–15.0)
MCH: 30.7 pg (ref 26.0–34.0)
MCHC: 32.2 g/dL (ref 30.0–36.0)
MCV: 95.4 fL (ref 80.0–100.0)
Platelets: 134 10*3/uL — ABNORMAL LOW (ref 150–400)
RBC: 3.48 MIL/uL — ABNORMAL LOW (ref 3.87–5.11)
RDW: 15.4 % (ref 11.5–15.5)
WBC: 10.7 10*3/uL — ABNORMAL HIGH (ref 4.0–10.5)
nRBC: 0.2 % (ref 0.0–0.2)

## 2019-02-03 MED ORDER — AMIODARONE LOAD VIA INFUSION
150.0000 mg | Freq: Once | INTRAVENOUS | Status: AC
  Administered 2019-02-03: 150 mg via INTRAVENOUS
  Filled 2019-02-03: qty 83.34

## 2019-02-03 MED ORDER — SODIUM CHLORIDE 0.9 % IV SOLN: 1.0000 g | Freq: Once | INTRAVENOUS | Status: DC

## 2019-02-03 MED ORDER — AMIODARONE HCL IN DEXTROSE 360-4.14 MG/200ML-% IV SOLN
60.0000 mg/h | INTRAVENOUS | Status: AC
  Filled 2019-02-03: qty 200

## 2019-02-03 MED ORDER — METOPROLOL TARTRATE 12.5 MG HALF TABLET
12.5000 mg | ORAL_TABLET | Freq: Two times a day (BID) | ORAL | Status: DC
  Administered 2019-02-03 (×2): 12.5 mg via ORAL
  Filled 2019-02-03 (×2): qty 1

## 2019-02-03 MED ORDER — ENOXAPARIN SODIUM 30 MG/0.3ML ~~LOC~~ SOLN
30.0000 mg | SUBCUTANEOUS | Status: DC
  Administered 2019-02-03 – 2019-02-06 (×4): 30 mg via SUBCUTANEOUS
  Filled 2019-02-03 (×4): qty 0.3

## 2019-02-03 MED ORDER — FUROSEMIDE 40 MG PO TABS
40.0000 mg | ORAL_TABLET | Freq: Every day | ORAL | Status: DC
  Administered 2019-02-03 – 2019-02-05 (×3): 40 mg via ORAL
  Filled 2019-02-03 (×3): qty 1

## 2019-02-03 MED ORDER — AMIODARONE HCL 200 MG PO TABS
200.0000 mg | ORAL_TABLET | Freq: Two times a day (BID) | ORAL | Status: DC
  Administered 2019-02-03 – 2019-02-04 (×3): 200 mg via ORAL
  Filled 2019-02-03 (×3): qty 1

## 2019-02-03 MED ORDER — AMIODARONE HCL IN DEXTROSE 360-4.14 MG/200ML-% IV SOLN
30.0000 mg/h | INTRAVENOUS | Status: AC
  Administered 2019-02-03: 30 mg/h via INTRAVENOUS
  Filled 2019-02-03 (×2): qty 200

## 2019-02-03 MED FILL — Potassium Chloride Inj 2 mEq/ML: INTRAVENOUS | Qty: 40 | Status: AC

## 2019-02-03 MED FILL — Lidocaine HCl Local Soln Prefilled Syringe 100 MG/5ML (2%): INTRAMUSCULAR | Qty: 10 | Status: AC

## 2019-02-03 MED FILL — Mannitol IV Soln 20%: INTRAVENOUS | Qty: 500 | Status: AC

## 2019-02-03 MED FILL — Sodium Bicarbonate IV Soln 8.4%: INTRAVENOUS | Qty: 50 | Status: AC

## 2019-02-03 MED FILL — Heparin Sodium (Porcine) Inj 1000 Unit/ML: INTRAMUSCULAR | Qty: 30 | Status: AC

## 2019-02-03 MED FILL — Lidocaine HCl Local Preservative Free (PF) Inj 2%: INTRAMUSCULAR | Qty: 15 | Status: AC

## 2019-02-03 MED FILL — Sodium Chloride IV Soln 0.9%: INTRAVENOUS | Qty: 2000 | Status: AC

## 2019-02-03 MED FILL — Heparin Sodium (Porcine) Inj 1000 Unit/ML: INTRAMUSCULAR | Qty: 10 | Status: AC

## 2019-02-03 MED FILL — Electrolyte-R (PH 7.4) Solution: INTRAVENOUS | Qty: 4000 | Status: AC

## 2019-02-03 NOTE — Progress Notes (Signed)
Patient converted to afib with RVR at 0128 HR 140s sustained EKG obtained afib w/ RVR, lopressor 5mg  given x1  HR decreased 110-120s. Dr. Kipp Brood notified Amiodarone bolus and drip initiated. HR remains 104-110.Denies Chest pain or discomfort. Alert & Oriented x4.

## 2019-02-03 NOTE — Care Management Important Message (Signed)
Important Message  Patient Details  Name: Dana Pruitt MRN: [id number] Date of Birth: 26-Nov-1940   Medicare Important Message Given:  Yes     Shelda Altes 02/03/2019, 11:13 AM

## 2019-02-03 NOTE — Discharge Summary (Addendum)
Physician Discharge Summary  Patient ID: Dana Pruitt MRN: [id number] DOB/AGE: May 16, 1941 78 y.o.  Admit date: 01/31/2019 Discharge date: 02/10/2019  Admission Diagnoses: Severe aortic insufficiency  Discharge Diagnoses:  Active Problems:   Aortic valve disease  Patient Active Problem List   Diagnosis Date Noted  . Atrial fibrillation ([place]) 02/09/2019  . Long term (current) use of anticoagulants 02/09/2019  . Aortic valve disease 01/31/2019  . Chest tightness   . Acute on chronic combined systolic and diastolic CHF (congestive heart failure) ([place])   . Pulmonary hypertension ([place])   . Chest pain 12/16/2018  . Chronic systolic CHF (congestive heart failure) ([place]) 12/16/2018  . Hypertensive urgency 12/16/2018  . Sepsis secondary to UTI ([place]) 12/16/2018  . Severe aortic regurgitation 09/05/2014  . Left ventricular dysfunction 05/11/2014  . HLD (hyperlipidemia) 03/28/2014  . Hollenhorst plaque, left eye 02/15/2014  . Osteopenia 11/16/2013  . COPD (chronic obstructive pulmonary disease) ([place]) 11/22/2012  . Acute hypoxemic respiratory failure ([place]) 10/17/2012  . Depression 10/17/2012  . Dehydration 10/17/2012  . Debility 10/17/2012  . Primary cancer of upper outer quadrant of right female breast ([place]) 02/27/2010   History of present illness:  The patient is a 78 year old female referred to Dr. [name] right for evaluation of severe aortic insufficiency.  She has been seen by her cardiologist for several years with serial echocardiogram.  Her most recent echo revealed severe AI with some LV dilatation and reduced LV systolic function.  She underwent right and left heart catheterization and was found to have no significant coronary artery disease.  Right heart catheterization showed normal pressures with mixed venous saturations of 68%.  LVEDP was normal and cardiac index was 2.9.  Pulmonary function studies were performed due to a history of smoking heavy smoking in the past, although  really remote.  Her FEV1 was 1.2 and DLCO was 40%.  A CT scan showed the ascending aorta measuring 4.0 cm.  There was findings of panlobular emphysema noted on the CT scan.  Dr. [name] evaluated the patient and all her studies and recommended proceeding with elective aortic valve replacement and she was admitted this hospitalization for the procedure.    Discharged Condition: good  Hospital Course: The patient was admitted electively and on 01/31/2019 taken to the operating room where she underwent the below described procedure.  She tolerated it well and was taken to the surgical intensive care unit in stable condition.  Postoperative hospital course:  The patient has done well postoperatively.  She has remained neurologically intact.  She was weaned from the ventilator using standard protocols without difficulty.  She initially required some inotropic support with dobutamine but this was able to be weaned without difficulty.  She was also started on Midodrine to assist with blood pressure.  She is not held to be a candidate at this time for ACE inhibitor or ARB.  She does have some postoperative volume overload and is responding to diuretics.  She has had postoperative atrial fibrillation and has been started on an amiodarone drip per usual protocol.  She was transitioned to p.o. amiodarone and subsequently was chemically cardioverted to sinus rhythm with only occasional runs of afib. Metoprolol was uptitrated and additionally she was started on Coumadin.  At the time of discharge the patient was very stable.  Home health arrangements were also made to have INR checked.  Additionally she did require home oxygen to be arranged with lower sats with ambulation in the qualifying range.  She has known severe  COPD.   Consults: None  Significant Diagnostic Studies: routine post op labs and serial CXR's  Treatments: surgery:  OPERATIVE REPORT  DATE OF PROCEDURE:  01/31/2019  OPERATION: 1.  Aortic  valve replacement with a 21 mm Inspiris Edwards valve. 2.  Placement of left femoral A line for blood pressure monitoring.  SURGEON:  Ivin Poot, MD  ASSISTANT:  Melodie Bouillon, MD  ANESTHESIA:  General by Dr. Rodman Comp.  PREOPERATIVE DIAGNOSIS:  Severe aortic insufficiency, moderate left ventricular dysfunction and left ventricular dilatation, history of smoking and breast cancer.  POSTOPERATIVE DIAGNOSIS:  Severe aortic insufficiency, moderate ventricular dysfunction and left ventricular dilatation, history of smoking and breast cancer.  Discharge Exam: Blood pressure (!) 133/97, pulse 94, temperature 97.6 F (36.4 C), temperature source Oral, resp. rate (!) 22, height 5\' 4"  (1.626 m), weight 55.7 kg, SpO2 96 %.  General appearance: alert, cooperative and no distress Heart: regular rate and rhythm Lungs: clear to auscultation bilaterally Abdomen: benign Extremities: no edema Wound: incis healing well  Disposition: Discharge disposition: 01-Home or Self Care       Discharge Instructions    Amb Referral to Cardiac Rehabilitation   Complete by: As directed    Diagnosis: Valve Replacement   Valve: Aortic   After initial evaluation and assessments completed: Virtual Based Care may be provided alone or in conjunction with Phase 2 Cardiac Rehab based on patient barriers.: Yes   Discharge patient   Complete by: As directed    Discharge disposition: 01-Home or Self Care   Discharge patient date: 02/07/2019   Face-to-face encounter (required for Medicare/Medicaid patients)   Complete by: As directed    I John Giovanni certify that this patient is under my care and that I, or a nurse practitioner or physician's assistant working with me, had a face-to-face encounter that meets the physician face-to-face encounter requirements with this patient on 02/07/2019. The encounter with the patient was in whole, or in part for the following medical condition(s) which is the  primary reason for home health care (List medical condition): S/P AORTIC VALVE REPLACEMENT   The encounter with the patient was in whole, or in part, for the following medical condition, which is the primary reason for home health care: AORTIC VALVE REPLACEMENT   I certify that, based on my findings, the following services are medically necessary home health services: Nursing   Reason for Medically Necessary Home Health Services: Skilled Nursing- Skilled Assessment/Observation   My clinical findings support the need for the above services: Shortness of breath with activity   Further, I certify that my clinical findings support that this patient is homebound due to: Unable to leave home safely without assistance   Home Health   Complete by: As directed    To provide the following care/treatments: RN   PT/INR DRAW ON THURS OR Friday AND Q WEEK , RESULTS TO [facility](RN) FOR RESTORATIVE CARE POST AVR     Allergies as of 02/07/2019   No Known Allergies     Medication List    TAKE these medications   amiodarone 400 MG tablet Commonly known as: PACERONE Take 1 tablet (400 mg total) by mouth 2 (two) times daily. FOR 5 DAYS THEN ONCE DAILY   amoxicillin 500 MG tablet Commonly known as: AMOXIL TAKE 4 TABLETS 1 HOUR BEFORE DENTAL EXAMINATION   aspirin EC 81 MG tablet Take 81 mg by mouth daily.   atorvastatin 20 MG tablet Commonly known as: LIPITOR  Take 20 mg by mouth daily.   carvedilol 12.5 MG tablet Commonly known as: COREG Take 1 tablet (12.5 mg total) by mouth 2 (two) times daily with a meal. What changed:   medication strength  See the new instructions.   cholecalciferol 25 MCG (1000 UT) tablet Commonly known as: VITAMIN D3 Take 1,000 Units by mouth daily.   denosumab 60 MG/ML Sosy injection Commonly known as: PROLIA Inject 60 mg into the skin every 6 (six) months.   guaiFENesin 600 MG 12 hr tablet Commonly known as: MUCINEX Take 1 tablet (600  mg total) by mouth 2 (two) times daily.   lisinopril 30 MG tablet Commonly known as: ZESTRIL Take 1 tablet (30 mg total) by mouth daily. What changed:   medication strength  See the new instructions.   sertraline 100 MG tablet Commonly known as: ZOLOFT Take 100 mg by mouth 2 (two) times a day.   traMADol 50 MG tablet Commonly known as: ULTRAM Take 1 tablet (50 mg total) by mouth every 6 (six) hours as needed for up to 7 days for moderate pain.   vitamin B-12 500 MCG tablet Commonly known as: CYANOCOBALAMIN Take 500 mcg by mouth daily.   warfarin 2 MG tablet Commonly known as: COUMADIN Take as directed. If you are unsure how to take this medication, talk to your nurse or doctor. Original instructions: Take 1.5 tablets (3 mg total) by mouth daily at 6 PM. AS DIRECTED BY THE COUMADIN CLINIC      Follow-up Information    Ivin Poot, MD Follow up.   Specialty: Cardiothoracic Surgery Why: Please see discharge paperwork for follow-up appointment with surgeon.  Please also obtain a chest x-ray at [facility] 1/2-hour prior to this appointment.  It is located in the same office complex on the first floor. Contact information: [address] [place] [address] [phone]        Dorothy Spark, MD Follow up.   Specialty: Cardiology Why: Please see discharge paperwork for 2-week follow-up appointment with cardiology. ALSO SEE DISCHARGE PAPERWORK FOR COUMADIN APPOINTMENT  Contact information: [address] [place] [address] [phone]        [facility] Follow up.   Why: Home 02 arranged- portable tank to be delivered to room piror to dishcarge for transport home Contact information: [place]  [address] [phone]        [facility] Follow up.   Specialty: Home Health Services Why: [facility] arranged- they will call you to set up home visits        The patient  has been discharged on:   1.Beta Blocker:  Yes [ y  ]                              No   [   ]                              If No, reason:  2.Ace Inhibitor/ARB: Yes [ y  ]                                     No  [    ]  If No, reason:  3.Statin:   Yes [ y  ]                  No  [   ]                  If No, reason:  4.Ecasa:  Yes  [  y ]                  No   [   ]                  If No, reason:  Signed: John Giovanni 02/10/2019, patient examined and medical record reviewed,agree with above note. [name] 02/14/2019

## 2019-02-03 NOTE — Progress Notes (Addendum)
[address]       [place] [address]             [phone]      3 Days Post-Op Procedure(s) (LRB): AORTIC VALVE REPLACEMENT (AVR) USING 21 MM INSPIRIS RESILIS AORTIC VALVE. SN: [id number] (N/A) TRANSESOPHAGEAL ECHOCARDIOGRAM (TEE) (N/A) Subjective: Went into afib overnight, currently on IV amio  Objective: Vital signs in last 24 hours: Temp:  [97.3 F (36.3 C)-98.6 F (37 C)] 98.1 F (36.7 C) (08/14 0356) Pulse Rate:  [27-136] 93 (08/14 0356) Cardiac Rhythm: Atrial fibrillation (08/14 0128) Resp:  [14-28] 18 (08/14 0356) BP: (88-139)/(57-97) 95/67 (08/14 0355) SpO2:  [89 %-98 %] 90 % (08/14 0356) Weight:  [61.4 kg] 61.4 kg (08/14 0356)  Hemodynamic parameters for last 24 hours:    Intake/Output from previous day: 08/13 0701 - 08/14 0700 In: 818.6 [P.O.:600; I.V.:58.5; IV Piggyback:160.1] Out: 950 [Urine:950] Intake/Output this shift: No intake/output data recorded.  General appearance: alert, cooperative and no distress Heart: irregularly irregular rhythm and tachy Lungs: clear to auscultation bilaterally Abdomen: benign Extremities: no edema Wound: incis healing well  Lab Results: Recent Labs    02/01/19 1719 02/02/19 0400  WBC 10.9* 8.3  HGB 10.3* 9.3*  HCT 32.2* 29.1*  PLT 105* 95*   BMET:  Recent Labs    02/01/19 1719 02/02/19 0400  NA 133* 134*  K 4.2 3.8  CL 103 101  CO2 22 23  GLUCOSE 173* 94  BUN 15 15  CREATININE 1.19* 1.05*  CALCIUM 6.6* 6.8*    PT/INR:  Recent Labs    01/31/19 1420  LABPROT 18.4*  INR 1.6*   ABG    Component Value Date/Time   PHART 7.317 (L) 02/01/2019 0832   HCO3 21.1 02/01/2019 0832   TCO2 22 02/01/2019 0832   ACIDBASEDEF 5.0 (H) 02/01/2019 0832   O2SAT 63.6 02/02/2019 0405   CBG (last 3)  Recent Labs    02/02/19 1638 02/02/19 2115 02/03/19 0613  GLUCAP 123* 147* 103*    Meds Scheduled Meds: . acetaminophen  1,000 mg Oral Q6H   Or  . acetaminophen (TYLENOL) oral liquid  160 mg/5 mL  1,000 mg Per Tube Q6H  . aspirin EC  325 mg Oral Daily   Or  . aspirin  324 mg Per Tube Daily  . atorvastatin  20 mg Oral q1800  . bisacodyl  10 mg Oral Daily   Or  . bisacodyl  10 mg Rectal Daily  . Chlorhexidine Gluconate Cloth  6 each Topical Daily  . docusate sodium  200 mg Oral Daily  . furosemide  40 mg Intravenous Daily  . guaiFENesin  600 mg Oral BID  . insulin aspart  0-24 Units Subcutaneous TID AC & HS  . mouth rinse  15 mL Mouth Rinse BID  . midodrine  5 mg Oral TID WC  . pantoprazole  40 mg Oral Daily  . potassium chloride  20 mEq Oral Daily  . sertraline  100 mg Oral QHS  . sodium chloride flush  3 mL Intravenous Q12H  . sodium chloride flush  3 mL Intravenous Q12H   Continuous Infusions: . sodium chloride Stopped (02/02/19 1137)  . sodium chloride    . amiodarone 60 mg/hr (02/03/19 0315)   Followed by  . amiodarone     PRN Meds:.sodium chloride, sodium chloride, alum & mag hydroxide-simeth, levalbuterol, magnesium hydroxide, metoprolol tartrate, ondansetron **OR** ondansetron (ZOFRAN) IV, oxyCODONE, sodium chloride flush, sodium chloride flush, traMADol  Xrays Dg Chest Port 1 View  Result Date: 02/02/2019 CLINICAL DATA:  Chest pain.  Status post aortic valve replacement EXAM: PORTABLE CHEST 1 VIEW COMPARISON:  February 01, 2019 FINDINGS: Swan-Ganz catheter has been removed. Cordis tip is in the superior vena cava. Temporary pacemaker wires are attached to the right heart. No evident pneumothorax. There is airspace consolidation in the left lower lobe. There are small pleural effusions bilaterally. The lungs elsewhere are clear. Heart is borderline prominent with pulmonary vascularity normal. There is aortic atherosclerosis. No bone lesions. IMPRESSION: Cordis tip in superior vena cava. No pneumothorax. Small pleural effusions bilaterally. Left lower lobe consolidation, likely due primarily to atelectasis but potentially with superimposed degree of pneumonia  or possibly aspiration. Stable cardiac silhouette. Status post aortic valve replacement. Aortic Atherosclerosis (ICD10-I70.0). Electronically Signed   By: Lowella Grip III M.D.   On: 02/02/2019 07:19    Assessment/Plan: S/P Procedure(s) (LRB): AORTIC VALVE REPLACEMENT (AVR) USING 21 MM INSPIRIS RESILIS AORTIC VALVE. SN: [id number] (N/A) TRANSESOPHAGEAL ECHOCARDIOGRAM (TEE) (N/A)  1 doing well, in afib on IV amio , will need to transition to po pretty soon with PIV access 2 BP a little soft at times, on midodrine 3 sats ok on 2 liters, cont pulm toilet 4 CXR shows small effus, , cont current lasix for now 5 labs pending 6 routine rehab    LOS: 3 days    John Giovanni PA-C 02/03/2019 Pager [phone]  Patient converted to NSR at 10 am Cont iv amio and stop when po amiodarone 200mg  bid starts this pm No NOAC for now  lovenox prophylaxis ordered for hospital prob plan on DC mon to home Po lasix 40 mg until close to prop wt patient examined and medical record reviewed,agree with above note. [name] 02/03/2019

## 2019-02-03 NOTE — Progress Notes (Signed)
CARDIAC REHAB PHASE I   PRE:  Rate/Rhythm: 79 SR  BP:  Sitting: 99/75      Standing: 85/62      SaO2: 87 RA   --> 95 2L  Upon entering room, pt requesting to use BSC. Pt noted to desat off oxygen moving over to [facility]. Pt also noted some lightheadedness upon standing. Orthos checked, and positive. Pt sat back down on BSC. Helped pt bathe, and then transferred to chair. Linens changed on bed. Call light and bedside table within reach. Encouraged chair for meals, IS use, and ambulation as able. RN at bedside.  [phone] Rufina Falco, RN BSN 02/03/2019 2:37 PM

## 2019-02-04 LAB — BPAM RBC
Blood Product Expiration Date: 202009072359
Blood Product Expiration Date: 202009072359
Blood Product Expiration Date: 202009072359
Blood Product Expiration Date: 202009082359
Blood Product Expiration Date: 202009082359
Blood Product Expiration Date: 202009082359
ISSUE DATE / TIME: 202008110956
ISSUE DATE / TIME: 202008110956
ISSUE DATE / TIME: 202008111043
ISSUE DATE / TIME: 202008111043
Unit Type and Rh: 6200
Unit Type and Rh: 6200
Unit Type and Rh: 6200
Unit Type and Rh: 6200
Unit Type and Rh: 6200
Unit Type and Rh: 6200

## 2019-02-04 LAB — TYPE AND SCREEN
ABO/RH(D): A POS
Antibody Screen: NEGATIVE
Unit division: 0
Unit division: 0
Unit division: 0
Unit division: 0
Unit division: 0
Unit division: 0

## 2019-02-04 LAB — BASIC METABOLIC PANEL
Anion gap: 11 (ref 5–15)
BUN: 20 mg/dL (ref 8–23)
CO2: 22 mmol/L (ref 22–32)
Calcium: 7.6 mg/dL — ABNORMAL LOW (ref 8.9–10.3)
Chloride: 101 mmol/L (ref 98–111)
Creatinine, Ser: 0.83 mg/dL (ref 0.44–1.00)
GFR calc Af Amer: >60 mL/min (ref >60)
GFR calc non Af Amer: >60 mL/min (ref >60)
Glucose, Bld: 92 mg/dL (ref 70–99)
Potassium: 3.4 mmol/L — ABNORMAL LOW (ref 3.5–5.1)
Sodium: 134 mmol/L — ABNORMAL LOW (ref 135–145)

## 2019-02-04 LAB — CBC
HCT: 29.6 % — ABNORMAL LOW (ref 36.0–46.0)
Hemoglobin: 10 g/dL — ABNORMAL LOW (ref 12.0–15.0)
MCH: 31 pg (ref 26.0–34.0)
MCHC: 33.8 g/dL (ref 30.0–36.0)
MCV: 91.6 fL (ref 80.0–100.0)
Platelets: 142 10*3/uL — ABNORMAL LOW (ref 150–400)
RBC: 3.23 MIL/uL — ABNORMAL LOW (ref 3.87–5.11)
RDW: 15 % (ref 11.5–15.5)
WBC: 7.8 10*3/uL (ref 4.0–10.5)
nRBC: 0 % (ref 0.0–0.2)

## 2019-02-04 LAB — GLUCOSE, CAPILLARY
Glucose-Capillary: 95 mg/dL (ref 70–99)
Glucose-Capillary: 98 mg/dL (ref 70–99)
Glucose-Capillary: 151 mg/dL — ABNORMAL HIGH (ref 70–99)

## 2019-02-04 MED ORDER — POTASSIUM CHLORIDE CRYS ER 20 MEQ PO TBCR
20.0000 meq | EXTENDED_RELEASE_TABLET | Freq: Two times a day (BID) | ORAL | Status: DC
  Administered 2019-02-04 – 2019-02-07 (×7): 20 meq via ORAL
  Filled 2019-02-04 (×7): qty 1

## 2019-02-04 MED ORDER — CARVEDILOL 6.25 MG PO TABS
6.2500 mg | ORAL_TABLET | Freq: Two times a day (BID) | ORAL | Status: DC
  Administered 2019-02-04 – 2019-02-05 (×4): 6.25 mg via ORAL
  Filled 2019-02-04 (×4): qty 1

## 2019-02-04 MED ORDER — POTASSIUM CHLORIDE CRYS ER 20 MEQ PO TBCR
20.0000 meq | EXTENDED_RELEASE_TABLET | Freq: Once | ORAL | Status: AC
  Administered 2019-02-04: 20 meq via ORAL
  Filled 2019-02-04: qty 1

## 2019-02-04 NOTE — Progress Notes (Signed)
CARDIAC REHAB PHASE I   PRE:  Rate/Rhythm: Sinus 96  BP:  Supine: 148/96  Sitting: 140/78  Standing: 118/79   SaO2: 94% 2l/min 86% RA walked on 2l/min   MODE:  Ambulation: 300 ft   POST:  Rate/Rhythem: Sinus 90  BP:    Sitting: 142/88     SaO2: 92% 0900-1015 Patient ambulated in hallway using rollator on 2l/min. Attempted to wean off oxygen. Saturations fell to 86% Recovered back to 90-92% once oxygen re applied. Patient tolerated ambulation without difficulty. Assisted back to recliner with call bell within reach. "this is the best I have felt." Reviewed sternal precautions, exercise guidelines restrictions and heart healthy diet with the patient. Referred to phase 2 in [place]. Mrs Matto is not sure whether she will participate in virtual or in person.  Harrell Gave RN BSN

## 2019-02-04 NOTE — Progress Notes (Signed)
      [address]       [place] [address]             [phone]      4 Days Post-Op Procedure(s) (LRB): AORTIC VALVE REPLACEMENT (AVR) USING 21 MM INSPIRIS RESILIS AORTIC VALVE. SN: [id number] (N/A) TRANSESOPHAGEAL ECHOCARDIOGRAM (TEE) (N/A)   Subjective:  Patient states she is doing well.  She feels short of breath at times.  She also continues to have some dizziness when she ambulates.  Objective: Vital signs in last 24 hours: Temp:  [97.5 F (36.4 C)-98 F (36.7 C)] 97.9 F (36.6 C) (08/15 0458) Pulse Rate:  [73-109] 86 (08/15 0500) Cardiac Rhythm: Normal sinus rhythm (08/15 0700) Resp:  [15-25] 18 (08/15 0500) BP: (85-151)/(62-95) 132/93 (08/15 0500) SpO2:  [90 %-97 %] 96 % (08/15 0500) Weight:  [59.1 kg] 59.1 kg (08/15 0500)  General appearance: alert, cooperative and no distress Heart: regular rate and rhythm Lungs: clear to auscultation bilaterally Abdomen: soft, non-tender; bowel sounds normal; no masses,  no organomegaly Extremities: edema none appreciated  Wound: clean and dry  Lab Results: Recent Labs    02/03/19 0755 02/04/19 0328  WBC 10.7* 7.8  HGB 10.7* 10.0*  HCT 33.2* 29.6*  PLT 134* 142*   BMET:  Recent Labs    02/03/19 0755 02/04/19 0328  NA 132* 134*  K 4.1 3.4*  CL 99 101  CO2 20* 22  GLUCOSE 115* 92  BUN 22 20  CREATININE 1.07* 0.83  CALCIUM 7.5* 7.6*    PT/INR: No results for input(s): LABPROT, INR in the last 72 hours. ABG    Component Value Date/Time   PHART 7.317 (L) 02/01/2019 0832   HCO3 21.1 02/01/2019 0832   TCO2 22 02/01/2019 0832   ACIDBASEDEF 5.0 (H) 02/01/2019 0832   O2SAT 63.6 02/02/2019 0405   CBG (last 3)  Recent Labs    02/03/19 1747 02/03/19 2116 02/04/19 0616  GLUCAP 101* 130* 95    Assessment/Plan: S/P Procedure(s) (LRB): AORTIC VALVE REPLACEMENT (AVR) USING 21 MM INSPIRIS RESILIS AORTIC VALVE. SN: [id number] (N/A) TRANSESOPHAGEAL ECHOCARDIOGRAM (TEE) (N/A)  1. CV- PAF, has been  maintaining NSR for 24 hours-- continue Amiodarone, will place on home Coreg, BP is labile ranging with SBP ranging from 80-150 on Midodrine 2. Pulm- no acute issues, weaning oxygen as able, auditory wheezing, encouraged patient to increase use of IS 3. Renal- creatinine stable, weight remains elevated, continue Lasix 4. Hypokalemia- increase dose to 20 meq BID, will give extra 20 mg dose today, repeat BMET in AM 5. CBGs controlled- patient is not a diabetic, will d/c SSIP and cbgs 6. DIspo- patient stable, maintaining NSR, continue diuretics, supplement K, repeat BMET in AM, d/c EPW.. per PVT likely d/c Monday   LOS: 4 days    Ellwood Handler 02/04/2019

## 2019-02-04 NOTE — Plan of Care (Signed)
  Problem: Education: Goal: Will demonstrate proper wound care and an understanding of methods to prevent future damage Outcome: Progressing   Problem: Education: Goal: Knowledge of disease or condition will improve Outcome: Progressing   

## 2019-02-05 LAB — PREPARE FRESH FROZEN PLASMA
Unit division: 0
Unit division: 0

## 2019-02-05 LAB — BPAM FFP
Blood Product Expiration Date: 202008162359
Blood Product Expiration Date: 202008162359
ISSUE DATE / TIME: 202008111134
ISSUE DATE / TIME: 202008111134
Unit Type and Rh: 6200
Unit Type and Rh: 6200

## 2019-02-05 LAB — BASIC METABOLIC PANEL
Anion gap: 10 (ref 5–15)
BUN: 16 mg/dL (ref 8–23)
CO2: 24 mmol/L (ref 22–32)
Calcium: 8.1 mg/dL — ABNORMAL LOW (ref 8.9–10.3)
Chloride: 103 mmol/L (ref 98–111)
Creatinine, Ser: 0.86 mg/dL (ref 0.44–1.00)
GFR calc Af Amer: >60 mL/min (ref >60)
GFR calc non Af Amer: >60 mL/min (ref >60)
Glucose, Bld: 124 mg/dL — ABNORMAL HIGH (ref 70–99)
Potassium: 3.8 mmol/L (ref 3.5–5.1)
Sodium: 137 mmol/L (ref 135–145)

## 2019-02-05 MED ORDER — MAGNESIUM OXIDE 400 (241.3 MG) MG PO TABS
400.0000 mg | ORAL_TABLET | Freq: Every day | ORAL | Status: DC
  Administered 2019-02-05: 400 mg via ORAL
  Filled 2019-02-05: qty 1

## 2019-02-05 MED ORDER — WARFARIN - PHYSICIAN DOSING INPATIENT: Freq: Every day | Status: DC

## 2019-02-05 MED ORDER — AMIODARONE IV BOLUS ONLY 150 MG/100ML
150.0000 mg | Freq: Once | INTRAVENOUS | Status: AC
  Administered 2019-02-05: 150 mg via INTRAVENOUS
  Filled 2019-02-05: qty 100

## 2019-02-05 MED ORDER — MAGNESIUM OXIDE 400 (241.3 MG) MG PO TABS
400.0000 mg | ORAL_TABLET | Freq: Two times a day (BID) | ORAL | Status: DC
  Administered 2019-02-05 – 2019-02-07 (×4): 400 mg via ORAL
  Filled 2019-02-05 (×4): qty 1

## 2019-02-05 MED ORDER — WARFARIN SODIUM 2.5 MG PO TABS
2.5000 mg | ORAL_TABLET | Freq: Every day | ORAL | Status: DC
  Administered 2019-02-05 – 2019-02-06 (×2): 2.5 mg via ORAL
  Filled 2019-02-05 (×2): qty 1

## 2019-02-05 MED ORDER — COLCHICINE 0.6 MG PO TABS
0.3000 mg | ORAL_TABLET | Freq: Two times a day (BID) | ORAL | Status: DC
  Administered 2019-02-05 – 2019-02-07 (×5): 0.3 mg via ORAL
  Filled 2019-02-05 (×6): qty 0.5

## 2019-02-05 MED ORDER — AMIODARONE HCL 200 MG PO TABS
400.0000 mg | ORAL_TABLET | Freq: Two times a day (BID) | ORAL | Status: DC
  Administered 2019-02-05 – 2019-02-07 (×5): 400 mg via ORAL
  Filled 2019-02-05 (×5): qty 2

## 2019-02-05 NOTE — Progress Notes (Addendum)
      [address]       [place] [address]             [phone]      5 Days Post-Op Procedure(s) (LRB): AORTIC VALVE REPLACEMENT (AVR) USING 21 MM INSPIRIS RESILIS AORTIC VALVE. SN: [id number] (N/A) TRANSESOPHAGEAL ECHOCARDIOGRAM (TEE) (N/A)   Subjective:  Patient complains of fast HR.  She developed atrial fibrillation overnight.  Is discouraged was hoping to go home soon.  Objective: Vital signs in last 24 hours: Temp:  [97.5 F (36.4 C)-97.9 F (36.6 C)] 97.5 F (36.4 C) (08/16 0356) Pulse Rate:  [25-115] 73 (08/16 0356) Cardiac Rhythm: Atrial fibrillation (08/16 0101) Resp:  [12-28] 18 (08/16 0356) BP: (97-162)/(69-143) 114/79 (08/16 0356) SpO2:  [90 %-97 %] 97 % (08/16 0356) Weight:  [59.4 kg] 59.4 kg (08/16 0356)  Intake/Output from previous day: 08/15 0701 - 08/16 0700 In: 300 [P.O.:300] Out: -   General appearance: alert, cooperative and no distress Heart: irregularly irregular rhythm Lungs: clear to auscultation bilaterally Abdomen: soft, non-tender; bowel sounds normal; no masses,  no organomegaly Extremities: edema trace Wound: clean and dry  Lab Results: Recent Labs    02/03/19 0755 02/04/19 0328  WBC 10.7* 7.8  HGB 10.7* 10.0*  HCT 33.2* 29.6*  PLT 134* 142*   BMET:  Recent Labs    02/04/19 0328 02/05/19 0314  NA 134* 137  K 3.4* 3.8  CL 101 103  CO2 22 24  GLUCOSE 92 124*  BUN 20 16  CREATININE 0.83 0.86  CALCIUM 7.6* 8.1*    PT/INR: No results for input(s): LABPROT, INR in the last 72 hours. ABG    Component Value Date/Time   PHART 7.317 (L) 02/01/2019 0832   HCO3 21.1 02/01/2019 0832   TCO2 22 02/01/2019 0832   ACIDBASEDEF 5.0 (H) 02/01/2019 0832   O2SAT 63.6 02/02/2019 0405   CBG (last 3)  Recent Labs    02/04/19 0616 02/04/19 1047 02/04/19 1607  GLUCAP 95 98 151*    Assessment/Plan: S/P Procedure(s) (LRB): AORTIC VALVE REPLACEMENT (AVR) USING 21 MM INSPIRIS RESILIS AORTIC VALVE. SN: [id number] (N/A)  TRANSESOPHAGEAL ECHOCARDIOGRAM (TEE) (N/A)  1. CV- PAF, developed again overnight, received IV Bolus of Amiodarone, remains in the 140-150s this morning, will repeat Amiodarone bolus, increase oral Amiodarone to 400 mg BID, continue Coreg, SBP was high all day yesterday, will stop Midodrine 2. Pulm- off oxygen, continue IS 3. Renal- creatinine has been stable, weight remains elevated on Lasix 4. Hypokalemia-improved continue potassium, will add Magnesium for 3 doses with A. Fib 5. Expected post operative blood loss anemia, Hgb is stable at 10.0 6. Dispo- patient with rapid A. Fib, will repeat Amiodarone bolus, increase oral Amiodarone, stop Midodrine as BP has recovered.... discussed anticoagulation with Dr. Orvan Seen, will start coumadin at 2.5 mg daily.   LOS: 5 days    Ellwood Handler PA-C 02/05/2019

## 2019-02-05 NOTE — Progress Notes (Signed)
Hung Amiodarone

## 2019-02-05 NOTE — Progress Notes (Addendum)
MEWS Guidelines - (patients age 77 and over)  Red - At High Risk for Deterioration Yellow - At risk for Deterioration  1. Go to room and assess patient 2. Validate data. Is this patient's baseline? If data confirmed: 3. Is this an acute change? 4. Administer prn meds/treatments as ordered. 5. Note Sepsis score 6. Review goals of care 7. Sports coach, RRT nurse and Provider. 8. Ask Provider to come to bedside.  9. Document patient condition/interventions/response. 10. Increase frequency of vital signs and focused assessments to at least q15 minutes x 4, then q30 minutes x2. - If stable, then q1h x3, then q4h x3 and then q8h or dept. routine. - If unstable, contact Provider & RRT nurse. Prepare for possible transfer. 11. Add entry in progress notes using the smart phrase ".MEWS". 1. Go to room and assess patient 2. Validate data. Is this patient's baseline? If data confirmed: 3. Is this an acute change? 4. Administer prn meds/treatments as ordered? 5. Note Sepsis score 6. Review goals of care 7. Sports coach and Provider 8. Call RRT nurse as needed. 9. Document patient condition/interventions/response. 10. Increase frequency of vital signs and focused assessments to at least q2h x2. - If stable, then q4h x2 and then q8h or dept. routine. - If unstable, contact Provider & RRT nurse. Prepare for possible transfer. 11. Add entry in progress notes using the smart phrase ".MEWS".  Green - Likely stable Lavender - Comfort Care Only  1. Continue routine/ordered monitoring.  2. Review goals of care. 1. Continue routine/ordered monitoring. 2. Review goals of care.    Patient converted to Afib with RVR, MD made aware orders received BS Carried out

## 2019-02-06 LAB — CBC
HCT: 35.8 % — ABNORMAL LOW (ref 36.0–46.0)
Hemoglobin: 11.5 g/dL — ABNORMAL LOW (ref 12.0–15.0)
MCH: 30.7 pg (ref 26.0–34.0)
MCHC: 32.1 g/dL (ref 30.0–36.0)
MCV: 95.5 fL (ref 80.0–100.0)
Platelets: 225 10*3/uL (ref 150–400)
RBC: 3.75 MIL/uL — ABNORMAL LOW (ref 3.87–5.11)
RDW: 14.8 % (ref 11.5–15.5)
WBC: 6.4 10*3/uL (ref 4.0–10.5)
nRBC: 0 % (ref 0.0–0.2)

## 2019-02-06 LAB — PROTIME-INR
INR: 1.2 (ref 0.8–1.2)
Prothrombin Time: 15.2 seconds (ref 11.4–15.2)

## 2019-02-06 LAB — BASIC METABOLIC PANEL
Anion gap: 10 (ref 5–15)
BUN: 18 mg/dL (ref 8–23)
CO2: 25 mmol/L (ref 22–32)
Calcium: 9 mg/dL (ref 8.9–10.3)
Chloride: 105 mmol/L (ref 98–111)
Creatinine, Ser: 0.96 mg/dL (ref 0.44–1.00)
GFR calc Af Amer: >60 mL/min (ref >60)
GFR calc non Af Amer: 57 mL/min — ABNORMAL LOW (ref >60)
Glucose, Bld: 119 mg/dL — ABNORMAL HIGH (ref 70–99)
Potassium: 4.2 mmol/L (ref 3.5–5.1)
Sodium: 140 mmol/L (ref 135–145)

## 2019-02-06 LAB — MAGNESIUM: Magnesium: 1.9 mg/dL (ref 1.7–2.4)

## 2019-02-06 LAB — TSH: TSH: 1.427 u[IU]/mL (ref 0.350–4.500)

## 2019-02-06 MED ORDER — CARVEDILOL 12.5 MG PO TABS
12.5000 mg | ORAL_TABLET | Freq: Two times a day (BID) | ORAL | Status: DC
  Administered 2019-02-06 – 2019-02-07 (×3): 12.5 mg via ORAL
  Filled 2019-02-06 (×3): qty 1

## 2019-02-06 MED ORDER — LISINOPRIL 10 MG PO TABS
10.0000 mg | ORAL_TABLET | Freq: Every day | ORAL | Status: DC
  Administered 2019-02-06: 10 mg via ORAL
  Filled 2019-02-06: qty 1

## 2019-02-06 MED ORDER — COUMADIN BOOK
Freq: Once | Status: AC
  Administered 2019-02-06: 12:00:00
  Filled 2019-02-06: qty 1

## 2019-02-06 MED ORDER — NITROGLYCERIN IN D5W 200-5 MCG/ML-% IV SOLN
INTRAVENOUS | Status: AC
  Filled 2019-02-06: qty 250

## 2019-02-06 MED ORDER — FUROSEMIDE 20 MG PO TABS
20.0000 mg | ORAL_TABLET | Freq: Every day | ORAL | Status: DC
  Administered 2019-02-06 – 2019-02-07 (×2): 20 mg via ORAL
  Filled 2019-02-06 (×2): qty 1

## 2019-02-06 NOTE — Progress Notes (Signed)
CARDIAC REHAB PHASE I   PRE:  Rate/Rhythm: 95 SR  BP:  Sitting: 155/99      SaO2: 97 RA  MODE:  Ambulation: 680 ft   POST:  Rate/Rhythm: 115 ST  BP:  Sitting: 177/114 --> 167/106    SaO2: 90 RA   Pt ambulated 672ft in hallway standby assist with front wheel walker. Pt c/o slight SOB, but able to increase distance without difficulty. Pts BP noted to be elevated post ambulation, RN aware. Pt declines any DME at this time. Encouraged walks and IS use. Pt in recliner, call bell and bedside table within reach.  [place], RN BSN 02/06/2019 1:48 PM

## 2019-02-06 NOTE — Progress Notes (Signed)
SATURATION QUALIFICATIONS: (This note is used to comply with regulatory documentation for home oxygen)  Patient Saturations on Room Air at Rest = 92%  Patient Saturations on Room Air while Ambulating = 82%  Patient Saturations on 2 Liters of oxygen while Ambulating = 93%  Please briefly explain why patient needs home oxygen:Pt needs home O2.  Desats to 82% on RA with activity.   [place] Pager:  [phone]  Office:  [phone]

## 2019-02-06 NOTE — Progress Notes (Signed)
Physical Therapy Treatment Patient Details Name: Dana Pruitt MRN: [id number] DOB: 11/30/1940 Today's Date: 02/06/2019    History of Present Illness Pt admit for AVR.      PT Comments    Pt admitted with above diagnosis. Pt ambulating well with RW and also can walk without RW with incr distance however desat to 82% on RA.  Will need home O2 at current sat measurements.  Will continue to follow pt.  Encouraged incentive spirometer and pursed lip breathing.  Pt currently with functional limitations due to endurance deficits. Pt will benefit from skilled PT to increase their independence and safety with mobility to allow discharge to the venue listed below.     Follow Up Recommendations  Home health PT;Supervision/Assistance - 24 hour     Equipment Recommendations  Will borrow Rolling walker with 5" wheels, home O2   Recommendations for Other Services       Precautions / Restrictions Precautions Precautions: Fall;Sternal Restrictions Weight Bearing Restrictions: Yes(Sternal precautions) RUE Weight Bearing: Non weight bearing(sternal precautions) LUE Weight Bearing: Non weight bearing(sternal precautions)    Mobility  Bed Mobility               General bed mobility comments: in chair  Transfers Overall transfer level: Needs assistance Equipment used: None Transfers: Sit to/from Stand Sit to Stand: Min guard         General transfer comment: Cues for technique/sternal precautions  Ambulation/Gait Ambulation/Gait assistance: Min guard Gait Distance (Feet): 400 Feet Assistive device: None Gait Pattern/deviations: Step-through pattern;Decreased stride length;Antalgic;Drifts right/left   Gait velocity interpretation: <1.31 ft/sec, indicative of household ambulator General Gait Details: Pt fairly steady without device.  Pt does drift right but no LOB but did not challenge.  Pt reports she will borrow a walker initially for comfort.  Pt sats on RA were 82% with  ambulation with good waveform.  Walked again on 2LO2  and sats to 93%.  Worked on cues for pursed lip breathing.  Also practiced incentive spirometer.     Stairs             Wheelchair Mobility    Modified Rankin (Stroke Patients Only)       Balance Overall balance assessment: Needs assistance Sitting-balance support: No upper extremity supported;Feet supported Sitting balance-Leahy Scale: Fair     Standing balance support: Bilateral upper extremity supported;During functional activity Standing balance-Leahy Scale: Poor Standing balance comment: relies on UE support for balance                            Cognition Arousal/Alertness: Awake/alert Behavior During Therapy: WFL for tasks assessed/performed Overall Cognitive Status: Within Functional Limits for tasks assessed                                        Exercises General Exercises - Lower Extremity Ankle Circles/Pumps: AROM;Both;10 reps;Supine Long Arc Quad: AROM;Both;10 reps;Seated    General Comments        Pertinent Vitals/Pain Pain Assessment: Faces Faces Pain Scale: Hurts little more Pain Location: incision Pain Descriptors / Indicators: Grimacing;Guarding;Discomfort Pain Intervention(s): Limited activity within patient's tolerance;Monitored during session;Repositioned    Home Living                      Prior Function  PT Goals (current goals can now be found in the care plan section) Acute Rehab PT Goals Patient Stated Goal: to go home Progress towards PT goals: Progressing toward goals    Frequency    Min 3X/week      PT Plan Current plan remains appropriate    Co-evaluation              AM-PAC PT "6 Clicks" Mobility   Outcome Measure  Help needed turning from your back to your side while in a flat bed without using bedrails?: None Help needed moving from lying on your back to sitting on the side of a flat bed without using  bedrails?: None Help needed moving to and from a bed to a chair (including a wheelchair)?: A Little Help needed standing up from a chair using your arms (e.g., wheelchair or bedside chair)?: A Little Help needed to walk in hospital room?: A Little Help needed climbing 3-5 steps with a railing? : A Little 6 Click Score: 20    End of Session Equipment Utilized During Treatment: Gait belt;Oxygen Activity Tolerance: Patient limited by fatigue Patient left: with call bell/phone within reach;in chair(Set up pt for lunch) Nurse Communication: Mobility status PT Visit Diagnosis: Unsteadiness on feet (R26.81);Muscle weakness (generalized) (M62.81);Pain Pain - part of body: (incision)     Time: 7867-6720 PT Time Calculation (min) (ACUTE ONLY): 19 min  Charges:  $Gait Training: 8-22 mins                     [place] Pager:  [phone]  Office:  [place] 02/06/2019, 4:13 PM

## 2019-02-06 NOTE — Progress Notes (Addendum)
[address]       [place] [address]             [phone]      6 Days Post-Op Procedure(s) (LRB): AORTIC VALVE REPLACEMENT (AVR) USING 21 MM INSPIRIS RESILIS AORTIC VALVE. SN: [id number] (N/A) TRANSESOPHAGEAL ECHOCARDIOGRAM (TEE) (N/A) Subjective: Feels ok, in sinus rhythm currently, denies SOB but on 2 liters [place]  Objective: Vital signs in last 24 hours: Temp:  [97.6 F (36.4 C)-98.2 F (36.8 C)] 97.7 F (36.5 C) (08/17 0430) Pulse Rate:  [25-129] 82 (08/17 0430) Cardiac Rhythm: Sinus tachycardia (08/16 1900) Resp:  [16-30] 19 (08/17 0430) BP: (86-167)/(59-108) 141/95 (08/17 0430) SpO2:  [65 %-100 %] 94 % (08/17 0430) Weight:  [56.3 kg] 56.3 kg (08/17 0440)  Hemodynamic parameters for last 24 hours:    Intake/Output from previous day: 08/16 0701 - 08/17 0700 In: 340 [P.O.:340] Out: -  Intake/Output this shift: No intake/output data recorded.  General appearance: alert, cooperative and no distress Heart: regular rate and rhythm Lungs: clear to auscultation bilaterally Abdomen: benign Extremities: no edema Wound: incis healing well  Lab Results: Recent Labs    02/04/19 0328 02/06/19 0438  WBC 7.8 6.4  HGB 10.0* 11.5*  HCT 29.6* 35.8*  PLT 142* 225   BMET:  Recent Labs    02/05/19 0314 02/06/19 0438  NA 137 140  K 3.8 4.2  CL 103 105  CO2 24 25  GLUCOSE 124* 119*  BUN 16 18  CREATININE 0.86 0.96  CALCIUM 8.1* 9.0    PT/INR:  Recent Labs    02/06/19 0438  LABPROT 15.2  INR 1.2   ABG    Component Value Date/Time   PHART 7.317 (L) 02/01/2019 0832   HCO3 21.1 02/01/2019 0832   TCO2 22 02/01/2019 0832   ACIDBASEDEF 5.0 (H) 02/01/2019 0832   O2SAT 63.6 02/02/2019 0405   CBG (last 3)  Recent Labs    02/04/19 0616 02/04/19 1047 02/04/19 1607  GLUCAP 95 98 151*    Meds Scheduled Meds: . amiodarone  400 mg Oral BID  . aspirin EC  325 mg Oral Daily   Or  . aspirin  324 mg Per Tube Daily  . atorvastatin  20 mg  Oral q1800  . bisacodyl  10 mg Oral Daily   Or  . bisacodyl  10 mg Rectal Daily  . carvedilol  6.25 mg Oral BID WC  . Chlorhexidine Gluconate Cloth  6 each Topical Daily  . colchicine  0.3 mg Oral BID  . docusate sodium  200 mg Oral Daily  . enoxaparin (LOVENOX) injection  30 mg Subcutaneous Q24H  . furosemide  40 mg Oral Daily  . guaiFENesin  600 mg Oral BID  . magnesium oxide  400 mg Oral BID  . mouth rinse  15 mL Mouth Rinse BID  . pantoprazole  40 mg Oral Daily  . potassium chloride  20 mEq Oral BID  . sertraline  100 mg Oral QHS  . sodium chloride flush  3 mL Intravenous Q12H  . warfarin  2.5 mg Oral q1800  . Warfarin - Physician Dosing Inpatient   Does not apply q1800   Continuous Infusions: . sodium chloride Stopped (02/02/19 1137)   PRN Meds:.sodium chloride, alum & mag hydroxide-simeth, levalbuterol, magnesium hydroxide, metoprolol tartrate, ondansetron **OR** ondansetron (ZOFRAN) IV, oxyCODONE, sodium chloride flush, traMADol  Xrays No results found.  Assessment/Plan: S/P Procedure(s) (LRB): AORTIC VALVE REPLACEMENT (AVR) USING 21 MM INSPIRIS  RESILIS AORTIC VALVE. SN: [id number] (N/A) TRANSESOPHAGEAL ECHOCARDIOGRAM (TEE) (N/A)  1 doing well , in sinus, INR 1.2 will cont slow load 2 some HTN, was on lisinopril at home, will resume at lower dose for now 3 magnesium being replaced 4 TSH normal 5 wean O2 off as able, cont pulm toilet 6 cont routine rehab 7 H/H improved 8 should be able to d/c diuretic soon  LOS: 6 days    Dana Pruitt 02/06/2019 Pager [phone]  Wean of O2 Increase betablocker DC tomorrow on low dose coumadin  patient examined and medical record reviewed,agree with above note. Dana Pruitt 02/06/2019

## 2019-02-07 ENCOUNTER — Other Ambulatory Visit: Payer: Self-pay | Admitting: Surgical

## 2019-02-07 LAB — PROTIME-INR
INR: 1.6 — ABNORMAL HIGH (ref 0.8–1.2)
Prothrombin Time: 18.7 seconds — ABNORMAL HIGH (ref 11.4–15.2)

## 2019-02-07 MED ORDER — LISINOPRIL 10 MG PO TABS
30.0000 mg | ORAL_TABLET | Freq: Every day | ORAL | Status: DC
  Administered 2019-02-07: 30 mg via ORAL
  Filled 2019-02-07: qty 3

## 2019-02-07 MED ORDER — CARVEDILOL 12.5 MG PO TABS: 12.5000 mg | ORAL_TABLET | Freq: Two times a day (BID) | ORAL | 1 refills | Status: DC

## 2019-02-07 MED ORDER — WARFARIN SODIUM 3 MG PO TABS
3.0000 mg | ORAL_TABLET | Freq: Every day | ORAL | Status: DC
  Administered 2019-02-07: 3 mg via ORAL
  Filled 2019-02-07: qty 1

## 2019-02-07 MED ORDER — TRAMADOL HCL 50 MG PO TABS: 50.0000 mg | ORAL_TABLET | Freq: Four times a day (QID) | ORAL | 0 refills | Status: AC | PRN

## 2019-02-07 MED ORDER — AMIODARONE HCL 400 MG PO TABS: 400.0000 mg | ORAL_TABLET | Freq: Two times a day (BID) | ORAL | 1 refills | Status: DC

## 2019-02-07 MED ORDER — LISINOPRIL 10 MG PO TABS: 20.0000 mg | ORAL_TABLET | Freq: Every day | ORAL | Status: DC

## 2019-02-07 MED ORDER — WARFARIN SODIUM 2 MG PO TABS: 3.0000 mg | ORAL_TABLET | Freq: Every day | ORAL | 1 refills | Status: DC

## 2019-02-07 MED ORDER — GUAIFENESIN ER 600 MG PO TB12: 600.0000 mg | ORAL_TABLET | Freq: Two times a day (BID) | ORAL | 0 refills | Status: DC

## 2019-02-07 MED ORDER — LISINOPRIL 30 MG PO TABS: 30.0000 mg | ORAL_TABLET | Freq: Every day | ORAL | 1 refills | Status: DC

## 2019-02-07 MED ORDER — PHENYLEPHRINE HCL-NACL 10-0.9 MG/250ML-% IV SOLN
INTRAVENOUS | Status: AC
  Filled 2019-02-07: qty 250

## 2019-02-07 NOTE — Care Management Important Message (Signed)
Important Message  Patient Details  Name: LEAIRA FULLAM MRN: [id number] Date of Birth: [birth year]   Medicare Important Message Given:  Yes     Shelda Altes 02/07/2019, 12:41 PM

## 2019-02-07 NOTE — Progress Notes (Addendum)
[address]       [place] [address]             [phone]      7 Days Post-Op Procedure(s) (LRB): AORTIC VALVE REPLACEMENT (AVR) USING 21 MM INSPIRIS RESILIS AORTIC VALVE. SN: [id number] (N/A) TRANSESOPHAGEAL ECHOCARDIOGRAM (TEE) (N/A) Subjective: Short episodes of afib with RVR, into 150's at times  Objective: Vital signs in last 24 hours: Temp:  [97.8 F (36.6 C)-98.4 F (36.9 C)] 98.1 F (36.7 C) (08/18 0442) Pulse Rate:  [88-116] 88 (08/18 0442) Cardiac Rhythm: Sinus tachycardia (08/18 0021) Resp:  [16-24] 20 (08/18 0442) BP: (129-160)/(93-108) 160/95 (08/18 0442) SpO2:  [90 %-98 %] 96 % (08/18 0442) Weight:  [55.7 kg] 55.7 kg (08/18 0442)  Hemodynamic parameters for last 24 hours:    Intake/Output from previous day: No intake/output data recorded. Intake/Output this shift: No intake/output data recorded.  General appearance: alert, cooperative and no distress Heart: regular rate and rhythm Lungs: clear to auscultation bilaterally Abdomen: benign Extremities: no edema Wound: incis healing well  Lab Results: Recent Labs    02/06/19 0438  WBC 6.4  HGB 11.5*  HCT 35.8*  PLT 225   BMET:  Recent Labs    02/05/19 0314 02/06/19 0438  NA 137 140  K 3.8 4.2  CL 103 105  CO2 24 25  GLUCOSE 124* 119*  BUN 16 18  CREATININE 0.86 0.96  CALCIUM 8.1* 9.0    PT/INR:  Recent Labs    02/07/19 0327  LABPROT 18.7*  INR 1.6*   ABG    Component Value Date/Time   PHART 7.317 (L) 02/01/2019 0832   HCO3 21.1 02/01/2019 0832   TCO2 22 02/01/2019 0832   ACIDBASEDEF 5.0 (H) 02/01/2019 0832   O2SAT 63.6 02/02/2019 0405   CBG (last 3)  Recent Labs    02/04/19 1047 02/04/19 1607  GLUCAP 98 151*    Meds Scheduled Meds: . amiodarone  400 mg Oral BID  . aspirin EC  325 mg Oral Daily   Or  . aspirin  324 mg Per Tube Daily  . atorvastatin  20 mg Oral q1800  . bisacodyl  10 mg Oral Daily   Or  . bisacodyl  10 mg Rectal Daily  .  carvedilol  12.5 mg Oral BID WC  . Chlorhexidine Gluconate Cloth  6 each Topical Daily  . colchicine  0.3 mg Oral BID  . docusate sodium  200 mg Oral Daily  . enoxaparin (LOVENOX) injection  30 mg Subcutaneous Q24H  . furosemide  20 mg Oral Daily  . guaiFENesin  600 mg Oral BID  . lisinopril  10 mg Oral Daily  . magnesium oxide  400 mg Oral BID  . mouth rinse  15 mL Mouth Rinse BID  . pantoprazole  40 mg Oral Daily  . potassium chloride  20 mEq Oral BID  . sertraline  100 mg Oral QHS  . sodium chloride flush  3 mL Intravenous Q12H  . warfarin  2.5 mg Oral q1800  . Warfarin - Physician Dosing Inpatient   Does not apply q1800   Continuous Infusions: . sodium chloride Stopped (02/02/19 1137)   PRN Meds:.sodium chloride, alum & mag hydroxide-simeth, levalbuterol, magnesium hydroxide, ondansetron **OR** ondansetron (ZOFRAN) IV, oxyCODONE, sodium chloride flush, traMADol  Xrays No results found.  Assessment/Plan: S/P Procedure(s) (LRB): AORTIC VALVE REPLACEMENT (AVR) USING 21 MM INSPIRIS RESILIS AORTIC VALVE. SN: [id number] (N/A) TRANSESOPHAGEAL ECHOCARDIOGRAM (TEE) (N/A)  1 overall  doing well but conts some afib with RVR, cont coreg/amio/coumadin 2 INR 1.6- cont current coumadin dose 3 O2 sats low with ambulation- will arrange home O2 4 BP has room to increase lisinopril dose 5 cont rehab/pulm toilet  LOS: 7 days    John Giovanni PA-C 02/07/2019 Pager [phone]   Short episodes of afib Increase coumadin to 3 mg daily- INR to coumadin clinic 8-21 Increase lisinopril to 30mg  daily  DC home on home O2  patient examined and medical record reviewed,agree with above note. [name] 02/07/2019

## 2019-02-07 NOTE — Plan of Care (Signed)
Discharge to home °

## 2019-02-07 NOTE — Progress Notes (Signed)
IV and telemetry discontinued. CCMD notified. Discharge instructions reviewed with patient. All questions answered.   K. Starr Gardy [place], RN 

## 2019-02-07 NOTE — Plan of Care (Signed)
Continue to monitor

## 2019-02-07 NOTE — Progress Notes (Signed)
Physical Therapy Treatment Patient Details Name: Dana Pruitt MRN: [id number] DOB: 02-07-1941 Today's Date: 02/07/2019    History of Present Illness Pt admit for AVR.      PT Comments    Pt admitted with above diagnosis. Pt was able to ambulate with good balance without device but still needing O2 to keep sats >88%.   Pt frustrated about having O2 but does understand the need. Encouraged pt to continue to practice pursed lip breathing and incentive spirometer use.  Pt currently with functional limitations due to balance and endurance deficits. Pt will benefit from skilled PT to increase their independence and safety with mobility to allow discharge to the venue listed below.     Follow Up Recommendations  Home health PT;Supervision/Assistance - 24 hour     Equipment Recommendations  Rolling walker with 5" wheels(home O2)    Recommendations for Other Services       Precautions / Restrictions Precautions Precautions: Fall;Sternal Restrictions Weight Bearing Restrictions: (sternal precautions) RUE Weight Bearing: Non weight bearing(sternal precautions) LUE Weight Bearing: Non weight bearing(sternal precautions)    Mobility  Bed Mobility Overal bed mobility: Independent             General bed mobility comments: standing at sink on arrival washing herself.  Pt stated she wet her panties and needed to put mesh panties on with pad.  Got pt a washcloth to clean with and she put the panties on.   Transfers Overall transfer level: Needs assistance Equipment used: None Transfers: Sit to/from Stand Sit to Stand: Min guard         General transfer comment: Cues for technique/sternal precautions  Ambulation/Gait Ambulation/Gait assistance: Min guard Gait Distance (Feet): 400 Feet Assistive device: None Gait Pattern/deviations: Step-through pattern;Decreased stride length;Antalgic;Drifts right/left   Gait velocity interpretation: <1.31 ft/sec, indicative of household  ambulator General Gait Details: Pt fairly steady without device.  Pt does drift right but no LOB but did not challenge.  Pt reports she will borrow a walker initially for comfort.  Pt sats on RA were 82% with ambulation with good waveform.  Walked again on 2LO2  and sats to 93%.  Worked on cues for pursed lip breathing.  Also practiced incentive spirometer.     Stairs             Wheelchair Mobility    Modified Rankin (Stroke Patients Only)       Balance Overall balance assessment: Needs assistance Sitting-balance support: No upper extremity supported;Feet supported Sitting balance-Leahy Scale: Fair     Standing balance support: During functional activity;No upper extremity supported Standing balance-Leahy Scale: Fair Standing balance comment: Pt can stand statically without UE support                             Cognition Arousal/Alertness: Awake/alert Behavior During Therapy: WFL for tasks assessed/performed Overall Cognitive Status: Within Functional Limits for tasks assessed                                        Exercises      General Comments        Pertinent Vitals/Pain Pain Assessment: Faces Faces Pain Scale: Hurts little more Pain Location: incision Pain Descriptors / Indicators: Grimacing;Guarding;Discomfort Pain Intervention(s): Limited activity within patient's tolerance;Monitored during session;Repositioned    Home Living  Prior Function            PT Goals (current goals can now be found in the care plan section) Acute Rehab PT Goals Patient Stated Goal: to go home Progress towards PT goals: Progressing toward goals    Frequency    Min 3X/week      PT Plan Current plan remains appropriate    Co-evaluation              AM-PAC PT "6 Clicks" Mobility   Outcome Measure  Help needed turning from your back to your side while in a flat bed without using bedrails?:  None Help needed moving from lying on your back to sitting on the side of a flat bed without using bedrails?: None Help needed moving to and from a bed to a chair (including a wheelchair)?: None Help needed standing up from a chair using your arms (e.g., wheelchair or bedside chair)?: None Help needed to walk in hospital room?: A Little Help needed climbing 3-5 steps with a railing? : A Little 6 Click Score: 22    End of Session Equipment Utilized During Treatment: Gait belt;Oxygen Activity Tolerance: Patient limited by fatigue Patient left: in bed;with call bell/phone within reach Nurse Communication: Mobility status PT Visit Diagnosis: Unsteadiness on feet (R26.81);Muscle weakness (generalized) (M62.81);Pain Pain - part of body: (incision)     Time: 9417-4081 PT Time Calculation (min) (ACUTE ONLY): 20 min  Charges:  $Gait Training: 8-22 mins                     [place] Pager:  [phone]  Office:  [place] 02/07/2019, 12:09 PM

## 2019-02-07 NOTE — Discharge Instructions (Signed)
Surgical Aortic Valve Replacement, Care After This sheet gives you information about how to care for yourself after your procedure. Your health care provider may also give you more specific instructions. If you have problems or questions, contact your health care provider. What can I expect after the procedure? After the procedure, it is common to have pain around your incision area. Follow these instructions at home: Medicines  Take over-the-counter and prescription medicines only as told by your health care provider.  If you were prescribed an antibiotic medicine, take it as told by your health care provider. Do not stop taking the antibiotic even if you start to feel better.  If you have a mechanical prosthesis, you may be given a blood thinner called warfarin. Follow instructions carefully on how to take this medicine.  Ask your health care provider if the medicine prescribed to you: ? Requires you to avoid driving or using heavy machinery. ? Can cause constipation. You may need to take actions to prevent or treat constipation, such as:  Take over-the-counter or prescription medicines.  Eat foods that are high in fiber, such as beans, whole grains, and fresh fruits and vegetables.  Limit foods that are high in fat and processed sugars, such as fried or sweet foods. Eating and drinking      Limit how much caffeine you drink. Caffeine can affect your heart's rate and rhythm.  Do not drink alcohol if: ? Your health care provider tells you not to drink. ? You are pregnant, may be pregnant, or are planning to become pregnant.  Drink enough fluid to keep your urine pale yellow.  Eat a heart-healthy diet that includes fruits, vegetables, whole grains, low-fat dairy products, and lean proteins like poultry and eggs. Incision care   Follow instructions from your health care provider about how to take care of your incision. Make sure you: ? Wash your hands with soap and water before  and after you change your bandage (dressing). If soap and water are not available, use hand sanitizer. ? Change your dressing as told by your health care provider. ? Leave stitches (sutures), skin glue, or adhesive strips in place. These skin closures may need to stay in place for 2 weeks or longer. If adhesive strip edges start to loosen and curl up, you may trim the loose edges. Do not remove adhesive strips completely unless your health care provider tells you to do that.  Check your incision area every day for signs of infection. Check for: ? Redness, swelling, or increasing pain. ? Fluid or blood. ? Warmth. ? Pus or a bad smell. Activity  Return to your normal activities as told by your health care provider. Most patients will need to limit any lifting or strenuous activity for 4-6 weeks.  Avoid sitting for a long time without moving. Get up to take short walks every 1-2 hours.  Do exercises as told by your health care provider.  Do not lift anything that is heavier than 10 lb (4.5 kg), or the limit that you are told, until your health care provider says that it is safe.  Avoid pushing or pulling things with your arms until your health care provider approves. This includes pulling on handrails to help you climb stairs. Lifestyle  Do not use any products that contain nicotine or tobacco, such as cigarettes, e-cigarettes, and chewing tobacco. These can delay incision healing after surgery. If you need help quitting, ask your health care provider.  Resume sexual activity as told  by your health care provider. If you have erectile dysfunction, do not use medicines to treat this condition unless your health care provider approves.  Work with your health care provider to: ? Keep your blood pressure and cholesterol under control. ? Manage any other heart conditions that you have. ? Maintain a healthy weight. Driving and travel  Do not drive until your health care provider approves. Ask  your health care provider when it is safe for you to drive.  Avoid airplane travel for as long as told by your health care provider.  When you travel, bring a list of your medicines and a record of your medical history with you. Carry your medicines with you. General instructions  Do not take baths, swim, or use a hot tub until your health care provider approves. You may take a shower.  Do not strain to have a bowel movement.  Avoid crossing your legs while sitting down.  Check your temperature every day for a fever. A fever may be a sign of infection.  If you are a woman and you plan to become pregnant, talk with your health care provider before you become pregnant.  Wear compression stockings as told by your health care provider. These stockings help to prevent blood clots and reduce swelling in your legs.  Tell all health care providers who care for you that you have an artificial (prosthetic) aortic valve. Also, tell them if you have or have had heart disease or endocarditis.  You will be given a card at discharge. The card indicates the type of prosthetic valve that you have. Keep this card in your wallet or purse for quick reference in case of an emergency.  Keep all follow-up visits as told by your health care provider. This is important. Contact a health care provider if:  You develop a skin rash.  You experience sudden, unexplained changes in your weight.  You have redness, swelling, or increasing pain around your incision.  You have fluid or blood coming from your incision.  Your incision feels warm to the touch.  You have pus or a bad smell coming from your incision.  You have a fever. Get help right away if you:  Develop chest pain that is different from the pain coming from your incision.  Develop shortness of breath or difficulty breathing.  Start to feel light-headed. These symptoms may represent a serious problem that is an emergency. Do not wait to see  if the symptoms will go away. Get medical help right away. Call your local emergency services (911 in the [place] Do not drive yourself to the hospital. Summary  After this procedure, it is common to have pain in the incision area.  Eat a heart-healthy diet. Follow instructions about alcohol use.  Get up to walk often. Avoid pushing and pulling with your arms. Ask what activities are safe for you.  Care for your incision as told by your health care provider. Check it daily for signs of infection.  Get help right away if you have chest pain that is different from your incisions, develop shortness of breath or difficulty breathing, or start to feel light-headed. This information is not intended to replace advice given to you by your health care provider. Make sure you discuss any questions you have with your health care provider. Document Released: 12/25/2004 Document Revised: 03/03/2018 Document Reviewed: 03/03/2018 Elsevier Patient Education  2020 [place] on my medicine - Coumadin   (Warfarin)  This medication  education was reviewed with me or my healthcare representative as part of my discharge preparation.  Why was Coumadin prescribed for you? Coumadin was prescribed for you because you have a blood clot or a medical condition that can cause an increased risk of forming blood clots. Blood clots can cause serious health problems by blocking the flow of blood to the heart, lung, or brain. Coumadin can prevent harmful blood clots from forming. As a reminder your indication for Coumadin is:   Blood Clot Prevention After Heart Valve Surgery  What test will check on my response to Coumadin? While on Coumadin (warfarin) you will need to have an INR test regularly to ensure that your dose is keeping you in the desired range. The INR (international normalized ratio) number is calculated from the result of the laboratory test called prothrombin time (PT).  If an INR APPOINTMENT  HAS NOT ALREADY BEEN MADE FOR YOU please schedule an appointment to have this lab work done by your health care provider within 7 days. Your INR goal is  a number between:  2 to 3  What  do you need to  know  About  COUMADIN? Take Coumadin (warfarin) exactly as prescribed by your healthcare provider about the same time each day.  DO NOT stop taking without talking to the doctor who prescribed the medication.  Stopping without other blood clot prevention medication to take the place of Coumadin may increase your risk of developing a new clot or stroke.  Get refills before you run out.  What do you do if you miss a dose? If you miss a dose, take it as soon as you remember on the same day then continue your regularly scheduled regimen the next day.  Do not take two doses of Coumadin at the same time.  Important Safety Information A possible side effect of Coumadin (Warfarin) is an increased risk of bleeding. You should call your healthcare provider right away if you experience any of the following: ? Bleeding from an injury or your nose that does not stop. ? Unusual colored urine (red or dark brown) or unusual colored stools (red or black). ? Unusual bruising for unknown reasons. ? A serious fall or if you hit your head (even if there is no bleeding).  Some foods or medicines interact with Coumadin (warfarin) and might alter your response to warfarin. To help avoid this: ? Eat a balanced diet, maintaining a consistent amount of Vitamin K. ? Notify your provider about major diet changes you plan to make. ? Avoid alcohol or limit your intake to 1 drink for women and 2 drinks for men per day. (1 drink is 5 oz. wine, 12 oz. beer, or 1.5 oz. liquor.)  Make sure that ANY health care provider who prescribes medication for you knows that you are taking Coumadin (warfarin).  Also make sure the healthcare provider who is monitoring your Coumadin knows when you have started a new medication including herbals  and non-prescription products.  Coumadin (Warfarin)  Major Drug Interactions  Increased Warfarin Effect Decreased Warfarin Effect  Alcohol (large quantities) Antibiotics (esp. Septra/Bactrim, Flagyl, Cipro) Amiodarone (Cordarone) Aspirin (ASA) Cimetidine (Tagamet) Megestrol (Megace) NSAIDs (ibuprofen, naproxen, etc.) Piroxicam (Feldene) Propafenone (Rythmol SR) Propranolol (Inderal) Isoniazid (INH) Posaconazole (Noxafil) Barbiturates (Phenobarbital) Carbamazepine (Tegretol) Chlordiazepoxide (Librium) Cholestyramine (Questran) Griseofulvin Oral Contraceptives Rifampin Sucralfate (Carafate) Vitamin K   Coumadin (Warfarin) Major Herbal Interactions  Increased Warfarin Effect Decreased Warfarin Effect  Garlic Ginseng Ginkgo biloba Coenzyme Q10 Green tea [place]  wort    Coumadin (Warfarin) FOOD Interactions  Eat a consistent number of servings per week of foods HIGH in Vitamin K (1 serving =  cup)  Collards (cooked, or boiled & drained) Kale (cooked, or boiled & drained) Mustard greens (cooked, or boiled & drained) Parsley *serving size only =  cup Spinach (cooked, or boiled & drained) Swiss chard (cooked, or boiled & drained) Turnip greens (cooked, or boiled & drained)  Eat a consistent number of servings per week of foods MEDIUM-HIGH in Vitamin K (1 serving = 1 cup)  Asparagus (cooked, or boiled & drained) Broccoli (cooked, boiled & drained, or raw & chopped) Brussel sprouts (cooked, or boiled & drained) *serving size only =  cup Lettuce, raw (green leaf, endive, romaine) Spinach, raw Turnip greens, raw & chopped   These websites have more information on Coumadin (warfarin):  FailFactory.se; VeganReport.com.au;

## 2019-02-07 NOTE — Consult Note (Signed)
   [facility] CM Inpatient Consult   02/07/2019  QUANTINA DERSHEM Feb 19, 1941 [id number]  Patient screened for high risk score today of 24% for unplanned readmission.  Review of patient's medical record reveals patient with new AVR.  [employer] in [facility]  Primary Care Provider is Dr Del[facility] this office is listed to provide the transition of care follow up.    Plan: Follow up with inpatient [facility] RNCM and attempted to call patient at bedside for post hospital follow up needs. No answer to the phone at this time.   Please place a [facility] Care Management consult as appropriate and for questions contact:   Natividad Brood, RN BSN [facility] Liaison  [phone] business mobile phone Toll free office [phone]  Fax number: [phone] [email or web address]

## 2019-02-07 NOTE — Progress Notes (Signed)
Chest tube sutures removed at this time. Benzoin and steri strips applied. Patient tolerated well.

## 2019-02-07 NOTE — TOC Transition Note (Signed)
Transition of Care [facility]) - CM/SW Discharge Note Marvetta Gibbons RN, BSN Transitions of Care Unit 4E- RN Case Manager [phone]   Patient Details  Name: Dana Pruitt MRN: [id number] Date of Birth: Jan 04, 1941  Transition of Care [facility]) CM/SW Contact:  Dawayne Patricia, RN Phone Number: 02/07/2019, 3:34 PM   Clinical Narrative:    Pt s/ AVR, stable for transition home today, orders placed for [facility] and DME- home 02. CM spoke with pt at bedside- list provided Per CMS guidelines from medicare.gov website with star ratings (copy placed in shadow chart)- per pt choice she has selected [facility] for [place] needs- and [employer] for DME. - Call made to [facility] with [employer] for Home 02 needs- portable tank to be delivered to room prior to discharge. Referral called to Butch Penny with [facility] for [facility] needs with INR checks- referral has been accepted by [facility].    Final next level of care: [place] Barriers to Discharge: No Barriers Identified   Patient Goals and CMS Choice Patient states their goals for this hospitalization and ongoing recovery are:: "to return home and get stronger" CMS Medicare.gov Compare Post Acute Care list provided to:: Patient Choice offered to / list presented to : Patient  Discharge Placement  Home with [facility]                     Discharge Plan and Services     Post Acute Care Choice: Durable Medical Equipment, Home Health          DME Arranged: Oxygen   Date DME Agency Contacted: 02/07/19 Time DME Agency Contacted: 1400 Representative spoke with at DME Agency: [place]: RN [place] Agency: [place] ([place]) Date [place]: 02/07/19 Time [place]: 1533 Representative spoke with at [place]: [place] ([place]) Interventions     Readmission Risk Interventions Readmission Risk Prevention Plan 02/07/2019  Transportation Screening Complete  PCP or Specialist Appt within 3-5 Days Complete  HRI  or [place] Complete  Social Work Consult for [place] Planning/Counseling Complete  Palliative Care Screening Complete  Medication Review Press photographer) Complete  Some recent data might be hidden

## 2019-02-09 ENCOUNTER — Telehealth (HOSPITAL_COMMUNITY): Payer: Self-pay

## 2019-02-09 ENCOUNTER — Ambulatory Visit (INDEPENDENT_AMBULATORY_CARE_PROVIDER_SITE_OTHER): Payer: Medicare Other | Admitting: Pharmacist Clinician (PhC)/ Clinical Pharmacy Specialist

## 2019-02-09 ENCOUNTER — Telehealth: Payer: Self-pay | Admitting: Cardiovascular Disease

## 2019-02-09 DIAGNOSIS — F419 Anxiety disorder, unspecified: Secondary | ICD-10-CM | POA: Diagnosis not present

## 2019-02-09 DIAGNOSIS — Z853 Personal history of malignant neoplasm of breast: Secondary | ICD-10-CM | POA: Diagnosis not present

## 2019-02-09 DIAGNOSIS — E785 Hyperlipidemia, unspecified: Secondary | ICD-10-CM | POA: Diagnosis not present

## 2019-02-09 DIAGNOSIS — Z8701 Personal history of pneumonia (recurrent): Secondary | ICD-10-CM | POA: Diagnosis not present

## 2019-02-09 DIAGNOSIS — Z952 Presence of prosthetic heart valve: Secondary | ICD-10-CM | POA: Diagnosis not present

## 2019-02-09 DIAGNOSIS — I359 Nonrheumatic aortic valve disorder, unspecified: Secondary | ICD-10-CM | POA: Diagnosis not present

## 2019-02-09 DIAGNOSIS — F329 Major depressive disorder, single episode, unspecified: Secondary | ICD-10-CM | POA: Diagnosis not present

## 2019-02-09 DIAGNOSIS — Z5181 Encounter for therapeutic drug level monitoring: Secondary | ICD-10-CM | POA: Diagnosis not present

## 2019-02-09 DIAGNOSIS — I110 Hypertensive heart disease with heart failure: Secondary | ICD-10-CM | POA: Diagnosis not present

## 2019-02-09 DIAGNOSIS — I480 Paroxysmal atrial fibrillation: Secondary | ICD-10-CM | POA: Insufficient documentation

## 2019-02-09 DIAGNOSIS — Z48812 Encounter for surgical aftercare following surgery on the circulatory system: Secondary | ICD-10-CM | POA: Diagnosis not present

## 2019-02-09 DIAGNOSIS — Z7901 Long term (current) use of anticoagulants: Secondary | ICD-10-CM

## 2019-02-09 DIAGNOSIS — I4891 Unspecified atrial fibrillation: Secondary | ICD-10-CM | POA: Diagnosis not present

## 2019-02-09 DIAGNOSIS — K219 Gastro-esophageal reflux disease without esophagitis: Secondary | ICD-10-CM | POA: Diagnosis not present

## 2019-02-09 DIAGNOSIS — Z9981 Dependence on supplemental oxygen: Secondary | ICD-10-CM | POA: Diagnosis not present

## 2019-02-09 DIAGNOSIS — I509 Heart failure, unspecified: Secondary | ICD-10-CM | POA: Diagnosis not present

## 2019-02-09 DIAGNOSIS — D649 Anemia, unspecified: Secondary | ICD-10-CM | POA: Diagnosis not present

## 2019-02-09 DIAGNOSIS — J431 Panlobular emphysema: Secondary | ICD-10-CM | POA: Diagnosis not present

## 2019-02-09 LAB — POCT INR: INR: 4.7 — AB (ref 2.0–3.0)

## 2019-02-09 NOTE — Telephone Encounter (Signed)
Called patient to see if she is interested in the Cardiac Rehab Program. Patient expressed interest. Explained scheduling process and went over insurance, patient verbalized understanding. Will contact patient for scheduling once f/u has been completed. 

## 2019-02-09 NOTE — Telephone Encounter (Signed)
Pt insurance is active and benefits verified through [facility]. Co-pay $0.00, DED $0.00/$0.00 met, out of pocket $3,900.00/$1,087.67 met, co-insurance 20%. No pre-authorization required. Karen/BCBS, 02/09/2019 @ 3:59PM, REF#[id number]  Will contact patient to see if she is interested in the Cardiac Rehab Program. If interested, patient will need to complete follow up appt. Once completed, patient will be contacted for scheduling upon review by the RN Navigator.

## 2019-02-09 NOTE — Telephone Encounter (Signed)
Spoke with RN, see anticoag note for today

## 2019-02-09 NOTE — Telephone Encounter (Signed)
  Home Health nurse states that patient has been taking 2 mg of her warfarin (COUMADIN) 2 MG tablet instead of the 3 mg. She states patient is now aware that she needs to take the 3 mg. Nurse will take her INR and call with the results.

## 2019-02-13 ENCOUNTER — Ambulatory Visit (INDEPENDENT_AMBULATORY_CARE_PROVIDER_SITE_OTHER): Payer: Medicare Other | Admitting: Pharmacist Clinician (PhC)/ Clinical Pharmacy Specialist

## 2019-02-13 ENCOUNTER — Other Ambulatory Visit (INDEPENDENT_AMBULATORY_CARE_PROVIDER_SITE_OTHER): Payer: Medicare Other

## 2019-02-13 DIAGNOSIS — I4891 Unspecified atrial fibrillation: Secondary | ICD-10-CM

## 2019-02-13 DIAGNOSIS — I359 Nonrheumatic aortic valve disorder, unspecified: Secondary | ICD-10-CM

## 2019-02-13 DIAGNOSIS — Z7901 Long term (current) use of anticoagulants: Secondary | ICD-10-CM | POA: Diagnosis not present

## 2019-02-13 LAB — POCT INR: INR: 1.8 — AB (ref 2.0–3.0)

## 2019-02-14 ENCOUNTER — Telehealth: Payer: Self-pay

## 2019-02-14 NOTE — Telephone Encounter (Signed)
Glenard Haring, nurse with [facility] contacted the office [phone] requesting verbal order for patient start of care for 1 time a week for 9 weeks, and 2 PRN visits for changes, if needed.  Patient is s/p AVR with Dr. Prescott Gum on 01/31/2019.  Given verbal orders via voicemail.  Will await faxed copy for physician signature.

## 2019-02-16 ENCOUNTER — Telehealth: Payer: Self-pay

## 2019-02-16 DIAGNOSIS — J449 Chronic obstructive pulmonary disease, unspecified: Secondary | ICD-10-CM | POA: Diagnosis not present

## 2019-02-16 NOTE — Telephone Encounter (Signed)
called patient to get her appt moved, our scheduled changed and patient needs to be seen in office. Patient agreeable with appt and will see Isaac Laud on Friday([place] virtual Monday and Tuesday morning)

## 2019-02-17 ENCOUNTER — Ambulatory Visit (INDEPENDENT_AMBULATORY_CARE_PROVIDER_SITE_OTHER): Payer: Medicare Other | Admitting: Cardiovascular Disease

## 2019-02-17 DIAGNOSIS — I359 Nonrheumatic aortic valve disorder, unspecified: Secondary | ICD-10-CM

## 2019-02-17 DIAGNOSIS — I4891 Unspecified atrial fibrillation: Secondary | ICD-10-CM

## 2019-02-17 DIAGNOSIS — Z7901 Long term (current) use of anticoagulants: Secondary | ICD-10-CM

## 2019-02-17 LAB — POCT INR: INR: 2.4 (ref 2.0–3.0)

## 2019-02-20 ENCOUNTER — Ambulatory Visit: Payer: Medicare Other | Admitting: Cardiology

## 2019-02-21 ENCOUNTER — Ambulatory Visit: Payer: Medicare Other | Admitting: Cardiology

## 2019-02-22 ENCOUNTER — Ambulatory Visit (INDEPENDENT_AMBULATORY_CARE_PROVIDER_SITE_OTHER): Payer: Medicare Other | Admitting: Pharmacist Clinician (PhC)/ Clinical Pharmacy Specialist

## 2019-02-22 DIAGNOSIS — Z4889 Encounter for other specified surgical aftercare: Secondary | ICD-10-CM | POA: Diagnosis not present

## 2019-02-22 DIAGNOSIS — Z7901 Long term (current) use of anticoagulants: Secondary | ICD-10-CM | POA: Diagnosis not present

## 2019-02-22 DIAGNOSIS — I110 Hypertensive heart disease with heart failure: Secondary | ICD-10-CM | POA: Diagnosis not present

## 2019-02-22 DIAGNOSIS — I4891 Unspecified atrial fibrillation: Secondary | ICD-10-CM | POA: Diagnosis not present

## 2019-02-22 DIAGNOSIS — I359 Nonrheumatic aortic valve disorder, unspecified: Secondary | ICD-10-CM

## 2019-02-22 DIAGNOSIS — I509 Heart failure, unspecified: Secondary | ICD-10-CM | POA: Diagnosis not present

## 2019-02-22 LAB — POCT INR: INR: 1.9 — AB (ref 2.0–3.0)

## 2019-02-24 ENCOUNTER — Ambulatory Visit: Payer: Medicare Other | Admitting: Pharmacist Clinician (PhC)/ Clinical Pharmacy Specialist

## 2019-02-24 ENCOUNTER — Ambulatory Visit (INDEPENDENT_AMBULATORY_CARE_PROVIDER_SITE_OTHER): Payer: Medicare Other | Admitting: Physician Assistant

## 2019-02-24 ENCOUNTER — Encounter: Payer: Self-pay | Admitting: Physician Assistant

## 2019-02-24 ENCOUNTER — Other Ambulatory Visit: Payer: Self-pay

## 2019-02-24 VITALS — BP 156/94 | HR 67 | Ht 64.0 in | Wt 120.6 lb

## 2019-02-24 DIAGNOSIS — I10 Essential (primary) hypertension: Secondary | ICD-10-CM

## 2019-02-24 DIAGNOSIS — I4891 Unspecified atrial fibrillation: Secondary | ICD-10-CM

## 2019-02-24 DIAGNOSIS — C50411 Malignant neoplasm of upper-outer quadrant of right female breast: Secondary | ICD-10-CM

## 2019-02-24 DIAGNOSIS — Z7901 Long term (current) use of anticoagulants: Secondary | ICD-10-CM

## 2019-02-24 DIAGNOSIS — E785 Hyperlipidemia, unspecified: Secondary | ICD-10-CM | POA: Diagnosis not present

## 2019-02-24 DIAGNOSIS — Z952 Presence of prosthetic heart valve: Secondary | ICD-10-CM

## 2019-02-24 DIAGNOSIS — I5042 Chronic combined systolic (congestive) and diastolic (congestive) heart failure: Secondary | ICD-10-CM

## 2019-02-24 DIAGNOSIS — J449 Chronic obstructive pulmonary disease, unspecified: Secondary | ICD-10-CM

## 2019-02-24 DIAGNOSIS — I359 Nonrheumatic aortic valve disorder, unspecified: Secondary | ICD-10-CM

## 2019-02-24 LAB — POCT INR: INR: 2 (ref 2.0–3.0)

## 2019-02-24 MED ORDER — LISINOPRIL 40 MG PO TABS: 40.0000 mg | ORAL_TABLET | Freq: Every day | ORAL | 3 refills | Status: DC

## 2019-02-24 NOTE — Patient Instructions (Signed)
Increase dose to 1 tablet daily except 1/2 tablet each Sunday, Tuesday and Thursday.   Repeat in 1 week with [facility] RN  Kennieth Rad

## 2019-02-24 NOTE — Progress Notes (Signed)
Cardiology Office Note    Date:  02/24/2019   ID:  Dana Pruitt, DOB [birth year], MRN [id number]  PCP:  Deland Pretty, MD  Cardiologist:  Dr. [name]   Chief Complaint  Patient presents with   Follow-up    recent AVR    History of Present Illness:  Dana Pruitt is a 78 y.o. female with PMH of COPD, HTN, HLD, chronic systolic heart failure, history of breast cancer s/p lumpectomy and chemo/radiation therapy, postop afib and aortic insufficiency s/p recent AVR.  She has a previously documented low EF of 35 to 40% on remote echocardiogram.  EF improved to 45 to 50% by February 2016.  EF on previous echocardiogram in 2018 and 2019 were normal.  She has known aortic insufficiency that is being followed by annual echocardiogram.  Her last office visit with Dr. [name] was in September 2018. Repeat echocardiogram obtained on 10/04/2018 shows EF 40 to 45%, severe aortic regurgitation, mild MR.  Patient was admitted for acute systolic heart failure in June 2020.  Coronary CT obtained on 12/17/2018 showed coronary calcium score 442 which placed the patient had a 30 percentile for age and sex matched control, normal coronary origin with right dominance, mild diffuse nonobstructive CAD, severely dilated pulmonary artery measuring at 45 mm consistent with pulmonary hypertension.  CT surgery was contacted for evaluation of AVR.  Left and right heart cath performed on 01/02/2019 showed mild nonobstructive CAD, low filling pressure, normal PA pressure, preserved cardiac output.  White aortic pulse pressure suggestive of significant aortic insufficiency.  Patient eventually underwent scheduled aortic valve replacement using a 21 mm Inspiris Resilis aortic valve by Dr. Prescott Gum on 01/31/2019.  Postprocedure, she did require some inotropic support with dobutamine, however this was able to be weaned without difficulty.  She was started on Midrin to assist with blood pressure.  She had a postoperative atrial  fibrillation and was started on amiodarone drip.  This was transitioned to oral amiodarone prior to discharge.  She did convert to sinus rhythm prior to discharge as well on the amiodarone.  She was started on Coumadin and metoprolol was uptitrated.  Patient presents today for cardiology office visit.  Her sternotomy scar is very well-healed without any redness or drainage.  She has no recent fever or chill.  She currently lives with her husband who is taking care of her.  She does have some chest soreness but only with cough otherwise she has been doing very well.  She will continue and finish the current course of amiodarone therapy then stop after that.  Her Coumadin therapy likely will last about 1-monththen stop afterward.  Since atrial fibrillation is postop only, she likely does not require long-term anticoagulation therapy unless atrial fibrillation recurs.  Blood pressure has been elevated recently mostly in the 130s but occasionally into the 150s.  I plan to increase her lisinopril to 40 mg daily.  She is due for INR check today as well.   Past Medical History:  Diagnosis Date   Anemia    "several times over the years" (10/17/2012)   Anxiety    Breast cancer ([facility] 02/2010   right breast   Carcinoma of breast treated with adjuvant hormone therapy ([facility] 9/11   Femara   CHF (congestive heart failure) (HCC)    COPD (chronic obstructive pulmonary disease) (HCC)    Depression    Exertional shortness of breath    "recently" (10/17/2012)   GERD (gastroesophageal reflux disease)  diet controlled, no med   Hollenhorst plaque, left eye    apparently resolved at recent ophthalmology visit   Hyperlipidemia    Hypertension    IDC, RightStage II, 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    Past Surgical History:  Procedure Laterality Date   AORTIC VALVE REPLACEMENT N/A  01/31/2019   Procedure: AORTIC VALVE REPLACEMENT (AVR) USING 21 MM INSPIRIS RESILIS AORTIC VALVE. SN: [id number];  Surgeon: Prescott Gum, Collier Salina, MD;  Location: [facility];  Service: Open Heart Surgery;  Laterality: N/A;   APPENDECTOMY  1974   BREAST BIOPSY Right 02/27/10   Needle core Biopsy; Invasive Mammary; ER/PR Positive, Her-2 Neu negative, Ki-67 22%   BREAST LUMPECTOMY WITH SENTINEL LYMPH NODE BIOPSY Right 07/03/11   Invasive Ductal Carcinoma;0/3 nodes negative,; ER,PR Positive, Her-2 Neg; Ki-67 22%   CESAREAN SECTION  1970; 1974   COLONOSCOPY     DILATION AND CURETTAGE OF UTERUS  1970's   RIGHT/LEFT HEART CATH AND CORONARY ANGIOGRAPHY N/A 01/02/2019   Procedure: RIGHT/LEFT HEART CATH AND CORONARY ANGIOGRAPHY;  Surgeon: Larey Dresser, MD;  Location: [facility];  Service: Cardiovascular;  Laterality: N/A;   TEE WITHOUT CARDIOVERSION N/A 01/31/2019   Procedure: TRANSESOPHAGEAL ECHOCARDIOGRAM (TEE);  Surgeon: Prescott Gum, Collier Salina, MD;  Location: [facility];  Service: Open Heart Surgery;  Laterality: N/A;   TUBAL LIGATION  1974   WISDOM TOOTH EXTRACTION      Current Medications: Outpatient Medications Prior to Visit  Medication Sig Dispense Refill   amiodarone (PACERONE) 400 MG tablet Take 1 tablet (400 mg total) by mouth 2 (two) times daily. FOR 5 DAYS THEN ONCE DAILY 65 tablet 1   amoxicillin (AMOXIL) 500 MG tablet TAKE 4 TABLETS 1 HOUR BEFORE DENTAL EXAMINATION 4 tablet 0   aspirin EC 81 MG tablet Take 81 mg by mouth daily.     atorvastatin (LIPITOR) 20 MG tablet Take 20 mg by mouth daily.      carvedilol (COREG) 12.5 MG tablet Take 1 tablet (12.5 mg total) by mouth 2 (two) times daily with a meal. 60 tablet 1   cholecalciferol (VITAMIN D3) 25 MCG (1000 UT) tablet Take 1,000 Units by mouth daily.     denosumab (PROLIA) 60 MG/ML SOSY injection Inject 60 mg into the skin every 6 (six) months.     guaiFENesin (MUCINEX) 600 MG 12 hr tablet Take 1 tablet (600 mg total) by mouth 2 (two)  times daily. 30 tablet 0   sertraline (ZOLOFT) 100 MG tablet Take 100 mg by mouth 2 (two) times a day.      vitamin B-12 (CYANOCOBALAMIN) 500 MCG tablet Take 500 mcg by mouth daily.     warfarin (COUMADIN) 2 MG tablet Take 1.5 tablets (3 mg total) by mouth daily at 6 PM. AS DIRECTED BY THE COUMADIN CLINIC 100 tablet 1   lisinopril (ZESTRIL) 30 MG tablet Take 1 tablet (30 mg total) by mouth daily. 30 tablet 1   Facility-Administered Medications Prior to Visit  Medication Dose Route Frequency Provider Last Rate Last Dose   sodium chloride flush (NS) 0.9 % injection 3 mL  3 mL Intravenous Q12H Larey Dresser, MD         Allergies:   Patient has no known allergies.   Social History   Socioeconomic History   Marital status: Married    Spouse name: Not on file  Number of children: 2   Years of education: college   Highest education level: Not on file  Occupational History    Employer: RETIRED  Social Needs   Financial resource strain: Not on file   Food insecurity    Worry: Not on file    Inability: Not on file   Transportation needs    Medical: Not on file    Non-medical: Not on file  Tobacco Use   Smoking status: Former Smoker    Packs/day: 3.00    Years: 30.00    Pack years: 90.00    Types: Cigarettes    Start date: 05/19/1958    Quit date: 06/30/1990    Years since quitting: 28.6   Smokeless tobacco: Never Used   Tobacco comment: Stopped smoking for 2-3 years during second child.   Substance and Sexual Activity   Alcohol use: Yes    Comment: 1-2 glasses wine/month   Drug use: No   Sexual activity: Yes    Birth control/protection: Post-menopausal    Comment: Menarche age 83, G43,P2, Menopause in 28"s, No HRT Married  Lifestyle   Physical activity    Days per week: Not on file    Minutes per session: Not on file   Stress: Not on file  Relationships   Social connections    Talks on phone: Not on file    Gets together: Not on file    Attends  religious service: Not on file    Active member of club or organization: Not on file    Attends meetings of clubs or organizations: Not on file    Relationship status: Not on file  Other Topics Concern   Not on file  Social History Narrative   [facility] (12/29/16):   Originally from [place], [place]. Moved to [place] at 78 y.o. Does have a dog. Hasn't worked outside the home. Remote parakeet exposure from her children. No mold or hot tub exposure. Enjoys doing yard work & walking.      Family History:  The patient's family history includes Cancer in her father and paternal grandmother; Heart disease in her maternal grandfather, maternal grandmother, and mother.   ROS:   Please see the history of present illness.    ROS All other systems reviewed and are negative.   PHYSICAL EXAM:   VS:  BP (!) 156/94    Pulse 67    Ht 5' 4" (1.626 m)    Wt 120 lb 9.6 oz (54.7 kg)    LMP  (LMP Unknown)    BMI 20.70 kg/m    GEN: Well nourished, well developed, in no acute distress  HEENT: normal  Neck: no JVD, carotid bruits, or masses Cardiac: RRR; no murmurs, rubs, or gallops,no edema  Respiratory:  clear to auscultation bilaterally, normal work of breathing GI: soft, nontender, nondistended, + BS MS: no deformity or atrophy  Skin: warm and dry, no rash Neuro:  Alert and Oriented x 3, Strength and sensation are intact Psych: euthymic mood, full affect  Wt Readings from Last 3 Encounters:  02/24/19 120 lb 9.6 oz (54.7 kg)  02/07/19 122 lb 12.8 oz (55.7 kg)  01/27/19 121 lb 3.2 oz (55 kg)      Studies/Labs Reviewed:   EKG:  EKG is ordered today.  The ekg ordered today demonstrates normal sinus rhythm with T wave inversion in inferolateral leads.  Recent Labs: 12/16/2018: B Natriuretic Peptide 321.3 01/27/2019: ALT 16 02/06/2019: BUN 18; Creatinine, Ser 0.96; Hemoglobin 11.5; Magnesium 1.9;  Platelets 225; Potassium 4.2; Sodium 140; TSH 1.427   Lipid Panel    Component Value Date/Time    CHOL 140 12/17/2018 0430   CHOL 197 02/16/2014 0826   TRIG 103 12/17/2018 0430   HDL 38 (L) 12/17/2018 0430   HDL 52 02/16/2014 0826   CHOLHDL 3.7 12/17/2018 0430   VLDL 21 12/17/2018 0430   LDLCALC 81 12/17/2018 0430   LDLCALC 127 (H) 02/16/2014 0826    Additional studies/ records that were reviewed today include:   Echo 10/04/2018 IMPRESSIONS  1. The left ventricle has mild-moderately reduced systolic function, with an ejection fraction of 40-45%. The cavity size was normal. Left ventricular diastolic Doppler parameters are consistent with impaired relaxation. Left ventricular diffuse  hypokinesis.  2. The right ventricle has normal systolic function. The cavity was normal.  3. The mitral valve is abnormal. Mild thickening of the mitral valve leaflet.  4. The tricuspid valve is grossly normal.  5. The aortic valve is tricuspid. Mild thickening of the aortic valve. Aortic valve regurgitation is severe by color flow Doppler. No stenosis of the aortic valve.  6. Mild to moderate global reduction in LV systolic function; mild diastolic dysfunction; severe AI; mild MR.   Cath 01/02/2019 1. Low filling pressures and normal PA pressure.  2. Preserved cardiac output.  3. Wide aortic pulse pressure is suggestive of significant aortic insufficiency.  Aortic root shot was not done due to CKD (GFR around 30).  4. Mild nonobstructive CAD.  30 cc contrast used.    AVR 01/31/2019 PRE-OPERATIVE DIAGNOSIS:  SEVERE Aortic insufficiency   POST-OPERATIVE DIAGNOSIS:  SEVERE Aortic insufficiency  PROCEDURE:  Procedure(s): AORTIC VALVE REPLACEMENT (AVR) USING 21 MM INSPIRIS RESILIS AORTIC VALVE. SN: [id number] (N/A) TRANSESOPHAGEAL ECHOCARDIOGRAM (TEE) (N/A)   ASSESSMENT:    1. Atrial fibrillation, unspecified type ([place])   2. Chronic combined systolic and diastolic CHF (congestive heart failure) ([facility])   3. Essential hypertension   4. Hyperlipidemia LDL goal <100   5. Chronic obstructive  pulmonary disease, unspecified COPD type ([place])   6. S/P AVR   7. Primary cancer of upper outer quadrant of right female breast ([facility])      PLAN:  In order of problems listed above:  1. Postop atrial fibrillation: We will continue the current course of amiodarone, once finish the current prescription, she will stop this medication.  Likely will not need long-term anticoagulation therapy for atrial fibrillation.  Current Coumadin therapy is intended for AVR.  2. Recent AVR: Sternotomy site is well-healed.  Continue aspirin and Coumadin.  Coumadin will last 3 months based on current protocol  3. History of breast cancer: Status post lumpectomy, chemotherapy and radiation therapy  4. Chronic combined systolic and diastolic heart failure: Euvolemic on physical exam.  Recent echocardiogram shows EF 40 to 45%.  5. Hypertension: Blood pressure mildly elevated, increase lisinopril to 40 mg daily.  6. Hyperlipidemia: Continue Lipitor.  7. COPD: No recent exacerbation.    Medication Adjustments/Labs and Tests Ordered: Current medicines are reviewed at length with the patient today.  Concerns regarding medicines are outlined above.  Medication changes, Labs and Tests ordered today are listed in the Patient Instructions below. Patient Instructions  Medication Instructions:  Increase Lisinopril to 40 mg daily.  Finish current prescription of amiodarone, then stop taking.  If you need a refill on your cardiac medications before your next appointment, please call your pharmacy.    Follow-Up: At [facility], you and your health needs are our priority.  As part of our continuing mission to provide you with exceptional heart care, we have created designated Provider Care Teams.  These Care Teams include your primary Cardiologist (physician) and Advanced Practice Providers (APPs -  Physician Assistants and Nurse Practitioners) who all work together to provide you with the care you need, when you  need it.  You will need a follow up appointment in 2-3 months (MD ONLY).  Please call our office 2 months in advance to schedule this appointment.  You may see Quay Burow, MD.       Signed, Almyra Deforest, [place]  02/24/2019 11:35 PM    [facility] [place], [place], [place]  [address] Phone: [phone]; Fax: [phone]

## 2019-02-24 NOTE — Patient Instructions (Signed)
Medication Instructions:  Increase Lisinopril to 40 mg daily.  Finish current prescription of amiodarone, then stop taking.  If you need a refill on your cardiac medications before your next appointment, please call your pharmacy.    Follow-Up: At [facility], you and your health needs are our priority.  As part of our continuing mission to provide you with exceptional heart care, we have created designated Provider Care Teams.  These Care Teams include your primary Cardiologist (physician) and Advanced Practice Providers (APPs -  Physician Assistants and Nurse Practitioners) who all work together to provide you with the care you need, when you need it. . You will need a follow up appointment in 2-3 months (MD ONLY).  Please call our office 2 months in advance to schedule this appointment.  You may see Quay Burow, MD.

## 2019-03-01 ENCOUNTER — Ambulatory Visit (INDEPENDENT_AMBULATORY_CARE_PROVIDER_SITE_OTHER): Payer: Medicare Other | Admitting: Pharmacist

## 2019-03-01 DIAGNOSIS — I4891 Unspecified atrial fibrillation: Secondary | ICD-10-CM

## 2019-03-01 DIAGNOSIS — Z7901 Long term (current) use of anticoagulants: Secondary | ICD-10-CM | POA: Diagnosis not present

## 2019-03-01 DIAGNOSIS — I359 Nonrheumatic aortic valve disorder, unspecified: Secondary | ICD-10-CM

## 2019-03-01 LAB — POCT INR: INR: 2.5 (ref 2.0–3.0)

## 2019-03-07 ENCOUNTER — Other Ambulatory Visit: Payer: Self-pay | Admitting: Cardiothoracic Surgery

## 2019-03-07 DIAGNOSIS — I359 Nonrheumatic aortic valve disorder, unspecified: Secondary | ICD-10-CM

## 2019-03-08 ENCOUNTER — Other Ambulatory Visit: Payer: Self-pay | Admitting: *Deleted

## 2019-03-08 ENCOUNTER — Other Ambulatory Visit: Payer: Self-pay

## 2019-03-08 ENCOUNTER — Ambulatory Visit
Admission: RE | Admit: 2019-03-08 | Discharge: 2019-03-08 | Disposition: A | Discharge status: Home / Self Care | Payer: Medicare Other | Source: Ambulatory Visit | Attending: Cardiothoracic Surgery | Admitting: Cardiothoracic Surgery

## 2019-03-08 ENCOUNTER — Ambulatory Visit (INDEPENDENT_AMBULATORY_CARE_PROVIDER_SITE_OTHER): Payer: Self-pay | Admitting: Cardiothoracic Surgery

## 2019-03-08 ENCOUNTER — Encounter: Payer: Self-pay | Admitting: Cardiothoracic Surgery

## 2019-03-08 VITALS — BP 147/92 | HR 88 | Temp 97.7°F | Resp 16 | Ht 64.0 in | Wt 120.0 lb

## 2019-03-08 DIAGNOSIS — Z952 Presence of prosthetic heart valve: Secondary | ICD-10-CM

## 2019-03-08 DIAGNOSIS — I359 Nonrheumatic aortic valve disorder, unspecified: Secondary | ICD-10-CM

## 2019-03-08 DIAGNOSIS — J90 Pleural effusion, not elsewhere classified: Secondary | ICD-10-CM | POA: Diagnosis not present

## 2019-03-08 DIAGNOSIS — I351 Nonrheumatic aortic (valve) insufficiency: Secondary | ICD-10-CM

## 2019-03-09 ENCOUNTER — Encounter: Payer: Self-pay | Admitting: Cardiothoracic Surgery

## 2019-03-09 NOTE — Progress Notes (Signed)
PCP is Deland Pretty, MD Referring Provider is Dorothy Spark, MD  Chief Complaint  Patient presents with  . Routine Post Op    4 wk f/u s/p AVR 01/31/19 with a CXR...has seen cardiologist..Marland KitchenLisinopril increased to 50m and Amio will be d/c'd when present script is completed    HPI: Patient returns for 1 month follow-up after aortic valve replacement for severe aortic stenosis.  Patient has done well and is anxious to increase her activity levels, drive, and perform household duties.  The incision is healed well.  She has no postop edema, shortness of breath, and minimal incisional pain.  Patient has maintained sinus rhythm.  Her chest x-ray taken today is personally reviewed which shows clear lung fields and resolution of small postoperative pleural effusions.  Past Medical History:  Diagnosis Date  . Anemia    "several times over the years" (10/17/2012)  . Anxiety   . Breast cancer ([facility] 02/2010   right breast  . Carcinoma of breast treated with adjuvant hormone therapy ([facility] 9/11   Fem[facility]  . CHF (congestive heart failure) ([facility]   . COPD (chronic obstructive pulmonary disease) ([facility]   . Depression   . Exertional shortness of breath    "recently" (10/17/2012)  . GERD (gastroesophageal reflux disease)    diet controlled, no med  . Hollenhorst plaque, left eye    apparently resolved at recent ophthalmology visit  . Hyperlipidemia   . Hypertension   . 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 Left ventricular dysfunction   . Moderate aortic insufficiency   . Pneumonia    "that's why I'm here" (10/17/2012)    Past Surgical History:  Procedure Laterality Date  . AORTIC VALVE REPLACEMENT N/A 01/31/2019   Procedure: AORTIC VALVE REPLACEMENT (AVR) USING 21 MM INSPIRIS RESILIS AORTIC VALVE. SN:: [id number]  Surgeon: VPrescott Gum PCollier Salina MD;  [facility]  Service: Open Heart Surgery;  Laterality: N/A;  . APPENDECTOMY  1974  . BREAST BIOPSY Right 02/27/10   Needle core Biopsy;  Invasive Mammary; ER/PR Positive, Her-2 Neu negative, Ki-67 22%  . BREAST LUMPECTOMY WITH SENTINEL LYMPH NODE BIOPSY Right 07/03/11   Invasive Ductal Carcinoma;0/3 nodes negative,; ER,PR Positive, Her-2 Neg; Ki-67 22%  . [place]  . COLONOSCOPY    . DILATION AND CURETTAGE OF UTERUS  1970's  . RIGHT/LEFT HEART CATH AND CORONARY ANGIOGRAPHY N/A 01/02/2019   Procedure: RIGHT/LEFT HEART CATH AND CORONARY ANGIOGRAPHY;  Surgeon: MLarey Dresser MD;  [facility];  Service: Cardiovascular;  Laterality: N/A;  . TEE WITHOUT CARDIOVERSION N/A 01/31/2019   Procedure: TRANSESOPHAGEAL ECHOCARDIOGRAM (TEE);  Surgeon: VPrescott Gum PCollier Salina MD;  [place] [facility]  Service: Open Heart Surgery;  Laterality: N/A;  . TUBAL LIGATION  1974  . WISDOM TOOTH EXTRACTION      Family History  Problem Relation Age of Onset  . Heart disease Mother   . Cancer Father   . Heart disease Maternal Grandmother   . Heart disease Maternal Grandfather   . Cancer Paternal Grandmother   . Lung disease Neg Hx     Social History Social History   Tobacco Use  . Smoking status: Former Smoker    Packs/day: 3.00    Years: 30.00    Pack years: 90.00    Types: Cigarettes    Start date: 05/19/1958    Quit date: 06/30/1990    Years since quitting: 28.7  . Smokeless tobacco: Never Used  . Tobacco comment: Stopped  smoking for 2-3 years during second child.   Substance Use Topics  . Alcohol use: Yes    Comment: 1-2 glasses wine/month  . Drug use: No    Current Outpatient Medications  Medication Sig Dispense Refill  . amiodarone (PACERONE) 400 MG tablet Take 1 tablet (400 mg total) by mouth 2 (two) times daily. FOR 5 DAYS THEN ONCE DAILY 65 tablet 1  . amoxicillin (AMOXIL) 500 MG tablet TAKE 4 TABLETS 1 HOUR BEFORE DENTAL EXAMINATION 4 tablet 0  . aspirin EC 81 MG tablet Take 81 mg by mouth daily.    [name] atorvastatin (LIPITOR) 20 MG tablet Take 20 mg by mouth daily.     . carvedilol (COREG) 12.5  MG tablet Take 1 tablet (12.5 mg total) by mouth 2 (two) times daily with a meal. 60 tablet 1  . cholecalciferol (VITAMIN D3) 25 MCG (1000 UT) tablet Take 1,000 Units by mouth daily.    [name] denosumab (PROLIA) 60 MG/ML SOSY injection Inject 60 mg into the skin every 6 (six) months.    [name] guaiFENesin (MUCINEX) 600 MG 12 hr tablet Take 1 tablet (600 mg total) by mouth 2 (two) times daily. 30 tablet 0  . lisinopril (ZESTRIL) 40 MG tablet Take 1 tablet (40 mg total) by mouth daily. 30 tablet 3  . sertraline (ZOLOFT) 100 MG tablet Take 100 mg by mouth 2 (two) times a day.     . vitamin B-12 (CYANOCOBALAMIN) 500 MCG tablet Take 500 mcg by mouth daily.    [name] warfarin (COUMADIN) 2 MG tablet Take 1.5 tablets (3 mg total) by mouth daily at 6 PM. AS DIRECTED BY THE COUMADIN CLINIC 100 tablet 1   No current facility-administered medications for this visit.    Facility-Administered Medications Ordered in Other Visits  Medication Dose Route Frequency Provider Last Rate Last Dose  . sodium chloride flush (NS) 0.9 % injection 3 mL  3 mL Intravenous Q12H Larey Dresser, MD        No Known Allergies  Review of Systems  Appetite and strength are improved Weight is stable Insomnia is improved Minimal numbness in the fifth finger of her left hand from the sternal incision Lisinopril dose was recently increased during her visit at the cardiology office  BP (!) 147/92 (BP Location: Right Arm, Patient Position: Sitting, Cuff Size: Normal)   Pulse 88   Temp 97.7 F (36.5 C)   Resp 16   Ht _0  (1.626 m)   Wt 120 lb (54.4 kg)   LMP  (LMP Unknown)   SpO2 94% Comment: RA...TRYING TO WEAN OFF O2  BMI 20.60 kg/m  Physical Exam      Exam    General- alert and comfortable    Neck- no JVD, no cervical adenopathy palpable, no carotid bruit   Lungs- clear without rales, wheezes.  Sternal incision well-healed.   Cor- regular rate and rhythm, no murmur , gallop   Abdomen- soft, non-tender   Extremities - warm,  non-tender, minimal edema   Neuro- oriented, appropriate, no focal weakness   Diagnostic Tests: Chest x-ray taken today is personally reviewed and shows clear lung fields, sternal wires intact  Imp: excellent early recovery after aortic valve replacement for  aortic stenosis Plan: Patient may drive, lift up to 10 pounds, resume normal housework and start cardiac rehab which will be encouraged.  She understands importance of heart healthy lifestyle to include 20 to 30 minutes daily of walking and heart healthy diet.  Continue current  medications.  Len Childs, MD [facility] [phone]

## 2019-03-13 ENCOUNTER — Encounter (HOSPITAL_COMMUNITY): Payer: Self-pay

## 2019-03-13 NOTE — Telephone Encounter (Signed)
Attempted to call patient in regards to Cardiac Rehab - LM on VM Mailed letter 

## 2019-03-15 ENCOUNTER — Ambulatory Visit (INDEPENDENT_AMBULATORY_CARE_PROVIDER_SITE_OTHER): Payer: Medicare Other | Admitting: Cardiovascular Disease

## 2019-03-15 ENCOUNTER — Other Ambulatory Visit: Payer: Self-pay | Admitting: Surgical

## 2019-03-15 DIAGNOSIS — Z5181 Encounter for therapeutic drug level monitoring: Secondary | ICD-10-CM | POA: Diagnosis not present

## 2019-03-15 DIAGNOSIS — Z952 Presence of prosthetic heart valve: Secondary | ICD-10-CM | POA: Diagnosis not present

## 2019-03-15 DIAGNOSIS — I4891 Unspecified atrial fibrillation: Secondary | ICD-10-CM | POA: Diagnosis not present

## 2019-03-15 DIAGNOSIS — Z7901 Long term (current) use of anticoagulants: Secondary | ICD-10-CM | POA: Diagnosis not present

## 2019-03-15 DIAGNOSIS — K219 Gastro-esophageal reflux disease without esophagitis: Secondary | ICD-10-CM | POA: Diagnosis not present

## 2019-03-15 DIAGNOSIS — E785 Hyperlipidemia, unspecified: Secondary | ICD-10-CM | POA: Diagnosis not present

## 2019-03-15 DIAGNOSIS — F419 Anxiety disorder, unspecified: Secondary | ICD-10-CM | POA: Diagnosis not present

## 2019-03-15 DIAGNOSIS — J431 Panlobular emphysema: Secondary | ICD-10-CM | POA: Diagnosis not present

## 2019-03-15 DIAGNOSIS — I509 Heart failure, unspecified: Secondary | ICD-10-CM | POA: Diagnosis not present

## 2019-03-15 DIAGNOSIS — Z48812 Encounter for surgical aftercare following surgery on the circulatory system: Secondary | ICD-10-CM | POA: Diagnosis not present

## 2019-03-15 DIAGNOSIS — I110 Hypertensive heart disease with heart failure: Secondary | ICD-10-CM | POA: Diagnosis not present

## 2019-03-15 DIAGNOSIS — I359 Nonrheumatic aortic valve disorder, unspecified: Secondary | ICD-10-CM | POA: Diagnosis not present

## 2019-03-15 DIAGNOSIS — Z8701 Personal history of pneumonia (recurrent): Secondary | ICD-10-CM | POA: Diagnosis not present

## 2019-03-15 DIAGNOSIS — Z9981 Dependence on supplemental oxygen: Secondary | ICD-10-CM | POA: Diagnosis not present

## 2019-03-15 DIAGNOSIS — F329 Major depressive disorder, single episode, unspecified: Secondary | ICD-10-CM | POA: Diagnosis not present

## 2019-03-15 DIAGNOSIS — D649 Anemia, unspecified: Secondary | ICD-10-CM | POA: Diagnosis not present

## 2019-03-15 DIAGNOSIS — Z853 Personal history of malignant neoplasm of breast: Secondary | ICD-10-CM | POA: Diagnosis not present

## 2019-03-15 LAB — POCT INR: INR: 1.6 — AB (ref 2.0–3.0)

## 2019-03-19 DIAGNOSIS — J449 Chronic obstructive pulmonary disease, unspecified: Secondary | ICD-10-CM | POA: Diagnosis not present

## 2019-03-20 DIAGNOSIS — M810 Age-related osteoporosis without current pathological fracture: Secondary | ICD-10-CM | POA: Diagnosis not present

## 2019-03-22 ENCOUNTER — Ambulatory Visit (INDEPENDENT_AMBULATORY_CARE_PROVIDER_SITE_OTHER): Payer: Medicare Other | Admitting: Cardiovascular Disease

## 2019-03-22 DIAGNOSIS — Z7901 Long term (current) use of anticoagulants: Secondary | ICD-10-CM | POA: Diagnosis not present

## 2019-03-22 DIAGNOSIS — I4891 Unspecified atrial fibrillation: Secondary | ICD-10-CM

## 2019-03-22 DIAGNOSIS — I359 Nonrheumatic aortic valve disorder, unspecified: Secondary | ICD-10-CM

## 2019-03-22 LAB — POCT INR: INR: 1.8 — AB (ref 2.0–3.0)

## 2019-03-28 ENCOUNTER — Telehealth (HOSPITAL_COMMUNITY): Payer: Self-pay | Admitting: Pharmacist

## 2019-03-28 NOTE — Telephone Encounter (Signed)
Cardiac Rehab Medication Review by a Pharmacist  Does the patient  feel that his/her medications are working for him/her?  yes  Has the patient been experiencing any side effects to the medications prescribed?  no  Does the patient measure his/her own blood pressure or blood glucose at home?  yes  Average 125/80  Does the patient have any problems obtaining medications due to transportation or finances?   no  Understanding of regimen: excellent Understanding of indications: excellent Potential of compliance: excellent  Pharmacist comments: N/A  Lorel Monaco, PharmD PGY1 Ambulatory Care Resident [facility] # [phone] 03/28/2019 12:33 PM

## 2019-03-29 ENCOUNTER — Ambulatory Visit (INDEPENDENT_AMBULATORY_CARE_PROVIDER_SITE_OTHER): Payer: Medicare Other | Admitting: Cardiology

## 2019-03-29 ENCOUNTER — Telehealth: Payer: Self-pay | Admitting: Cardiovascular Disease

## 2019-03-29 ENCOUNTER — Telehealth (HOSPITAL_COMMUNITY): Payer: Self-pay | Admitting: *Deleted

## 2019-03-29 DIAGNOSIS — I4891 Unspecified atrial fibrillation: Secondary | ICD-10-CM

## 2019-03-29 DIAGNOSIS — Z7901 Long term (current) use of anticoagulants: Secondary | ICD-10-CM

## 2019-03-29 DIAGNOSIS — I359 Nonrheumatic aortic valve disorder, unspecified: Secondary | ICD-10-CM | POA: Diagnosis not present

## 2019-03-29 LAB — POCT INR: INR: 1.3 — AB (ref 2.0–3.0)

## 2019-03-29 NOTE — Telephone Encounter (Signed)
I will make an anticoagulation encounter and route to pharmd fr dosing

## 2019-03-29 NOTE — Telephone Encounter (Signed)
New Tourist information centre manager (nurse) from [place] is calling in to report INR result of 1.3 was via finger stick today. States a detailed message can be left for her about whether patient's medicine needs to be altered.

## 2019-03-29 NOTE — Telephone Encounter (Signed)
Routed to CVRR 

## 2019-03-30 ENCOUNTER — Other Ambulatory Visit: Payer: Self-pay

## 2019-03-30 ENCOUNTER — Encounter (HOSPITAL_COMMUNITY)
Admission: RE | Admit: 2019-03-30 | Discharge: 2019-03-30 | Disposition: A | Discharge status: Home / Self Care | Payer: Medicare Other | Source: Ambulatory Visit | Attending: Cardiovascular Disease | Admitting: Cardiovascular Disease

## 2019-03-30 VITALS — Ht 64.0 in | Wt 116.4 lb

## 2019-03-30 DIAGNOSIS — Z952 Presence of prosthetic heart valve: Secondary | ICD-10-CM

## 2019-03-30 NOTE — Progress Notes (Signed)
Patient's pre walk test oxygen saturation was noted at 94% on room air at cardiac rehab orientation. Patient's oxygen saturation dropped to 86% on room air 2:40 seconds into the walk test. Patient's oxygen saturations dropped to 84% on room air 4:18 seconds into the walk test. Patient reported level 3 dyspnea. Cecilie Kicks NP paged and notified about drop in O2 sats. Order obtained for patient to exercise on 2l/minute of oxygen. Patient placed on 2l/min of oxygen. Oxygen saturation recovered to 96% after rest. Upon assessment lung fields with diminished breath sounds right base other wise lung fields clear. Oxygen removed after walk test completed. Patient reports that she sometimes wears oxygen at night at home and went home on oxygen after her hospitalization. Patient left cardiac rehab without complaints of shortness of breath.Will continue to monitor the patient throughout  the program.Dana Venetia Maxon, RN,BSN 03/30/2019 4:15 PM

## 2019-03-30 NOTE — Progress Notes (Signed)
Cardiac Individual Treatment Plan  Patient Details  Name: Dana Pruitt MRN: [id number] Date of Birth: January 30, 1941 Referring Provider:     CARDIAC REHAB PHASE II ORIENTATION from 03/30/2019 in [place]  Referring Provider  Dr. [name]      Initial Encounter Date:    CARDIAC REHAB PHASE II ORIENTATION from 03/30/2019 in [facility]  Date  03/30/19      Visit Diagnosis: S/P AVR (aortic valve replacement)  Patient's Home Medications on Admission:  Current Outpatient Medications:  .  amiodarone (PACERONE) 400 MG tablet, Take 1 tablet (400 mg total) by mouth 2 (two) times daily. FOR 5 DAYS THEN ONCE DAILY (Patient not taking: Reported on 03/28/2019), Disp: 65 tablet, Rfl: 1 .  amoxicillin (AMOXIL) 500 MG tablet, TAKE 4 TABLETS 1 HOUR BEFORE DENTAL EXAMINATION (Patient not taking: Reported on 03/28/2019), Disp: 4 tablet, Rfl: 0 .  aspirin EC 81 MG tablet, Take 81 mg by mouth daily., Disp: , Rfl:  .  atorvastatin (LIPITOR) 20 MG tablet, Take 20 mg by mouth daily. , Disp: , Rfl:  .  carvedilol (COREG) 12.5 MG tablet, Take 1 tablet (12.5 mg total) by mouth 2 (two) times daily with a meal., Disp: 60 tablet, Rfl: 1 .  cholecalciferol (VITAMIN D3) 25 MCG (1000 UT) tablet, Take 1,000 Units by mouth daily., Disp: , Rfl:  .  denosumab (PROLIA) 60 MG/ML SOSY injection, Inject 60 mg into the skin every 6 (six) months., Disp: , Rfl:  .  guaiFENesin (MUCINEX) 600 MG 12 hr tablet, Take 1 tablet (600 mg total) by mouth 2 (two) times daily., Disp: 30 tablet, Rfl: 0 .  lisinopril (ZESTRIL) 40 MG tablet, Take 1 tablet (40 mg total) by mouth daily., Disp: 30 tablet, Rfl: 3 .  sertraline (ZOLOFT) 100 MG tablet, Take 100 mg by mouth 2 (two) times a day. , Disp: , Rfl:  .  vitamin B-12 (CYANOCOBALAMIN) 500 MCG tablet, Take 500 mcg by mouth daily., Disp: , Rfl:  .  warfarin (COUMADIN) 2 MG tablet, Take 1.5 tablets (3 mg total) by mouth daily at 6 PM. AS  DIRECTED BY THE [facility], Disp: 100 tablet, Rfl: 1 No current facility-administered medications for this encounter.   Facility-Administered Medications Ordered in Other Encounters:  .  sodium chloride flush (NS) 0.9 % injection 3 mL, 3 mL, Intravenous, Q12H, Larey Dresser, MD  Past Medical History: Past Medical History:  Diagnosis Date  . Anemia    "several times over the years" (10/17/2012)  . Anxiety   . Breast cancer ([place]) 02/2010   right breast  . Carcinoma of breast treated with adjuvant hormone therapy ([place]) 9/11   [facility]  . CHF (congestive heart failure) ([place])   . COPD (chronic obstructive pulmonary disease) ([place])   . Depression   . Exertional shortness of breath    "recently" (10/17/2012)  . GERD (gastroesophageal reflux disease)    diet controlled, no med  . Hollenhorst plaque, left eye    apparently resolved at recent ophthalmology visit  . Hyperlipidemia   . Hypertension   . 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 Left ventricular dysfunction   . Moderate aortic insufficiency   . Pneumonia    "that's why I'm here" (10/17/2012)    Tobacco Use: Social History   Tobacco Use  Smoking Status Former Smoker  . Packs/day: 3.00  . Years: 30.00  . Pack years: 90.00  . Types: Cigarettes  .  Start date: 05/19/1958  . Quit date: 06/30/1990  . Years since quitting: 28.7  Smokeless Tobacco Never Used  Tobacco Comment   Stopped smoking for 2-3 years during second child.     Labs: Recent Review Flowsheet Data    Labs for ITP Cardiac and Pulmonary Rehab Latest Ref Rng & Units 01/31/2019 02/01/2019 02/01/2019 02/01/2019 02/02/2019   Cholestrol 0 - 200 mg/dL - - - - -   LDLCALC 0 - 99 mg/dL - - - - -   HDL >40 mg/dL - - - - -   Trlycerides <150 mg/dL - - - - -   Hemoglobin A1c 4.8 - 5.6 % - - - - -   PHART 7.350 - 7.450 - 7.356 7.310(L) 7.317(L) -   PCO2ART 32.0 - 48.0 mmHg - 36.7 43.5 41.0 -   HCO3 20.0 - 28.0 mmol/L - 20.7 22.0 21.1 -   TCO2 22 - 32  mmol/L 24 22 23 22  -   ACIDBASEDEF 0.0 - 2.0 mmol/L - 5.0(H) 4.0(H) 5.0(H) -   O2SAT % - 90.0 93.0 89.0 63.6      Capillary Blood Glucose: Lab Results  Component Value Date   GLUCAP 151 (H) 02/04/2019   GLUCAP 98 02/04/2019   GLUCAP 95 02/04/2019   GLUCAP 130 (H) 02/03/2019   GLUCAP 101 (H) 02/03/2019     Exercise Target Goals: Exercise Program Goal: Individual exercise prescription set using results from initial 6 min walk test and THRR while considering  patient's activity barriers and safety.   Exercise Prescription Goal: Initial exercise prescription builds to 30-45 minutes a day of aerobic activity, 2-3 days per week.  Home exercise guidelines will be given to patient during program as part of exercise prescription that the participant will acknowledge.  Activity Barriers & Risk Stratification: Activity Barriers & Cardiac Risk Stratification - 03/30/19 1538      Activity Barriers & Cardiac Risk Stratification   Activity Barriers  Deconditioning;Muscular Weakness;Shortness of Breath    Cardiac Risk Stratification  High       6 Minute Walk: 6 Minute Walk    Row Name 03/30/19 1535         6 Minute Walk   Phase  Initial     Distance  1206 feet     Walk Time  6 minutes     # of Rest Breaks  1 Pt O2 level dropped to 86% on RA. Pt rested for 45 seconds on 2L O2 until O2 increased to 96%.     MPH  2.2     METS  2.6     RPE  13     Perceived Dyspnea   3     VO2 Peak  9.4     Symptoms  Yes (comment)     Comments  SOB +3, Pt O2 started at 94% RA. At minute 2:40 Pt O2 was 86%. 2L of O2 was added and after 45 seconds Pt O2 was 96%. Pt finished walk test on 2L O2.     Resting HR  73 bpm     Resting BP  104/72     Resting Oxygen Saturation   94 %     Exercise Oxygen Saturation  during 6 min walk  97 %     Max Ex. HR  96 bpm     Max Ex. BP  128/72     2 Minute Post BP  112/70        Oxygen Initial Assessment:  Oxygen Re-Evaluation:   Oxygen Discharge (Final  Oxygen Re-Evaluation):   Initial Exercise Prescription: Initial Exercise Prescription - 03/30/19 1500      Date of Initial Exercise RX and Referring Provider   Date  03/30/19    Referring Provider  Dr. [name]    Expected Discharge Date  05/26/19      NuStep   Level  2    SPM  75    Minutes  15    METs  2      Arm Ergometer   Level  1    Watts  15    Minutes  15    METs  2.57      Prescription Details   Frequency (times per week)  3    Duration  Progress to 30 minutes of continuous aerobic without signs/symptoms of physical distress      Intensity   THRR 40-80% of Max Heartrate  58-115    Ratings of Perceived Exertion  11-13    Perceived Dyspnea  0-4      Progression   Progression  Continue to progress workloads to maintain intensity without signs/symptoms of physical distress.      Resistance Training   Training Prescription  Yes    Weight  2 lbs.     Reps  10-15       Perform Capillary Blood Glucose checks as needed.  Exercise Prescription Changes:   Exercise Comments:   Exercise Goals and Review: Exercise Goals    Row Name 03/30/19 1540             Exercise Goals   Increase Physical Activity  Yes       Intervention  Provide advice, education, support and counseling about physical activity/exercise needs.;Develop an individualized exercise prescription for aerobic and resistive training based on initial evaluation findings, risk stratification, comorbidities and participant's personal goals.       Expected Outcomes  Short Term: Attend rehab on a regular basis to increase amount of physical activity.;Long Term: Add in home exercise to make exercise part of routine and to increase amount of physical activity.;Long Term: Exercising regularly at least 3-5 days a week.       Increase Strength and Stamina  Yes       Intervention  Provide advice, education, support and counseling about physical activity/exercise needs.;Develop an individualized exercise  prescription for aerobic and resistive training based on initial evaluation findings, risk stratification, comorbidities and participant's personal goals.       Expected Outcomes  Short Term: Increase workloads from initial exercise prescription for resistance, speed, and METs.;Short Term: Perform resistance training exercises routinely during rehab and add in resistance training at home;Long Term: Improve cardiorespiratory fitness, muscular endurance and strength as measured by increased METs and functional capacity (6MWT)       Able to understand and use rate of perceived exertion (RPE) scale  Yes       Intervention  Provide education and explanation on how to use RPE scale       Expected Outcomes  Short Term: Able to use RPE daily in rehab to express subjective intensity level;Long Term:  Able to use RPE to guide intensity level when exercising independently       Able to understand and use Dyspnea scale  Yes       Intervention  Provide education and explanation on how to use Dyspnea scale       Expected Outcomes  Short Term: Able to use Dyspnea scale  daily in rehab to express subjective sense of shortness of breath during exertion;Long Term: Able to use Dyspnea scale to guide intensity level when exercising independently       Knowledge and understanding of Target Heart Rate Range (THRR)  Yes       Intervention  Provide education and explanation of THRR including how the numbers were predicted and where they are located for reference       Expected Outcomes  Short Term: Able to state/look up THRR;Long Term: Able to use THRR to govern intensity when exercising independently;Short Term: Able to use daily as guideline for intensity in rehab       Able to check pulse independently  Yes       Intervention  Provide education and demonstration on how to check pulse in carotid and radial arteries.;Review the importance of being able to check your own pulse for safety during independent exercise        Expected Outcomes  Short Term: Able to explain why pulse checking is important during independent exercise;Long Term: Able to check pulse independently and accurately       Understanding of Exercise Prescription  Yes       Intervention  Provide education, explanation, and written materials on patient's individual exercise prescription       Expected Outcomes  Short Term: Able to explain program exercise prescription;Long Term: Able to explain home exercise prescription to exercise independently          Exercise Goals Re-Evaluation :   Discharge Exercise Prescription (Final Exercise Prescription Changes):   Nutrition:  Target Goals: Understanding of nutrition guidelines, daily intake of sodium 1500mg , cholesterol 200mg , calories 30% from fat and 7% or less from saturated fats, daily to have 5 or more servings of fruits and vegetables.  Biometrics: Pre Biometrics - 03/30/19 1540      Pre Biometrics   Height  5\' 4"  (1.626 m)    Weight  52.8 kg    Waist Circumference  31 inches    Hip Circumference  36 inches    Waist to Hip Ratio  0.86 %    BMI (Calculated)  19.97    Triceps Skinfold  20 mm    % Body Fat  32.5 %    Grip Strength  29 kg    Flexibility  16.5 in    Single Leg Stand  30 seconds        Nutrition Therapy Plan and Nutrition Goals:   Nutrition Assessments:   Nutrition Goals Re-Evaluation:   Nutrition Goals Re-Evaluation:   Nutrition Goals Discharge (Final Nutrition Goals Re-Evaluation):   Psychosocial: Target Goals: Acknowledge presence or absence of significant depression and/or stress, maximize coping skills, provide positive support system. Participant is able to verbalize types and ability to use techniques and skills needed for reducing stress and depression.  Initial Review & Psychosocial Screening: Initial Psych Review & Screening - 03/30/19 1538      Initial Review   Current issues with  None Identified   Rozay has a history of depression  but does not report any issues currently.     Family Dynamics   Good Support System?  Yes   Pt reports that her husband is a source of support for her.     Barriers   Psychosocial barriers to participate in program  There are no identifiable barriers or psychosocial needs.       Quality of Life Scores: Quality of Life - 03/30/19 1537  Quality of Life   Select  Quality of Life      Quality of Life Scores   Health/Function Pre  27.43 %    Socioeconomic Pre  30 %    Psych/Spiritual Pre  28 %    Family Pre  30 %    GLOBAL Pre  28.45 %      Scores of 19 and below usually indicate a poorer quality of life in these areas.  A difference of  2-3 points is a clinically meaningful difference.  A difference of 2-3 points in the total score of the Quality of Life Index has been associated with significant improvement in overall quality of life, self-image, physical symptoms, and general health in studies assessing change in quality of life.  PHQ-9: Recent Review Flowsheet Data    There is no flowsheet data to display.     Interpretation of Total Score  Total Score Depression Severity:  1-4 = Minimal depression, 5-9 = Mild depression, 10-14 = Moderate depression, 15-19 = Moderately severe depression, 20-27 = Severe depression   Psychosocial Evaluation and Intervention:   Psychosocial Re-Evaluation:   Psychosocial Discharge (Final Psychosocial Re-Evaluation):   Vocational Rehabilitation: Provide vocational rehab assistance to qualifying candidates.   Vocational Rehab Evaluation & Intervention: Vocational Rehab - 03/30/19 1552      Initial Vocational Rehab Evaluation & Intervention   Assessment shows need for Vocational Rehabilitation  No       Education: Education Goals: Education classes will be provided on a weekly basis, covering required topics. Participant will state understanding/return demonstration of topics presented.  Learning Barriers/Preferences: Learning  Barriers/Preferences - 03/30/19 1542      Learning Barriers/Preferences   Learning Barriers  None    Learning Preferences  Skilled Demonstration       Education Topics: Count Your Pulse:  -Group instruction provided by verbal instruction, demonstration, patient participation and written materials to support subject.  Instructors address importance of being able to find your pulse and how to count your pulse when at home without a heart monitor.  Patients get hands on experience counting their pulse with staff help and individually.   Heart Attack, Angina, and Risk Factor Modification:  -Group instruction provided by verbal instruction, video, and written materials to support subject.  Instructors address signs and symptoms of angina and heart attacks.    Also discuss risk factors for heart disease and how to make changes to improve heart health risk factors.   Functional Fitness:  -Group instruction provided by verbal instruction, demonstration, patient participation, and written materials to support subject.  Instructors address safety measures for doing things around the house.  Discuss how to get up and down off the floor, how to pick things up properly, how to safely get out of a chair without assistance, and balance training.   Meditation and Mindfulness:  -Group instruction provided by verbal instruction, patient participation, and written materials to support subject.  Instructor addresses importance of mindfulness and meditation practice to help reduce stress and improve awareness.  Instructor also leads participants through a meditation exercise.    Stretching for Flexibility and Mobility:  -Group instruction provided by verbal instruction, patient participation, and written materials to support subject.  Instructors lead participants through series of stretches that are designed to increase flexibility thus improving mobility.  These stretches are additional exercise for major  muscle groups that are typically performed during regular warm up and cool down.   Hands Only CPR:  -Group verbal, video, and  participation provides a basic overview of AHA guidelines for community CPR. Role-play of emergencies allow participants the opportunity to practice calling for help and chest compression technique with discussion of AED use.   Hypertension: -Group verbal and written instruction that provides a basic overview of hypertension including the most recent diagnostic guidelines, risk factor reduction with self-care instructions and medication management.    Nutrition I class: Heart Healthy Eating:  -Group instruction provided by PowerPoint slides, verbal discussion, and written materials to support subject matter. The instructor gives an explanation and review of the Therapeutic Lifestyle Changes diet recommendations, which includes a discussion on lipid goals, dietary fat, sodium, fiber, plant stanol/sterol esters, sugar, and the components of a well-balanced, healthy diet.   Nutrition II class: Lifestyle Skills:  -Group instruction provided by PowerPoint slides, verbal discussion, and written materials to support subject matter. The instructor gives an explanation and review of label reading, grocery shopping for heart health, heart healthy recipe modifications, and ways to make healthier choices when eating out.   Diabetes Question & Answer:  -Group instruction provided by PowerPoint slides, verbal discussion, and written materials to support subject matter. The instructor gives an explanation and review of diabetes co-morbidities, pre- and post-prandial blood glucose goals, pre-exercise blood glucose goals, signs, symptoms, and treatment of hypoglycemia and hyperglycemia, and foot care basics.   Diabetes Blitz:  -Group instruction provided by PowerPoint slides, verbal discussion, and written materials to support subject matter. The instructor gives an explanation and  review of the physiology behind type 1 and type 2 diabetes, diabetes medications and rational behind using different medications, pre- and post-prandial blood glucose recommendations and Hemoglobin A1c goals, diabetes diet, and exercise including blood glucose guidelines for exercising safely.    Portion Distortion:  -Group instruction provided by PowerPoint slides, verbal discussion, written materials, and food models to support subject matter. The instructor gives an explanation of serving size versus portion size, changes in portions sizes over the last 20 years, and what consists of a serving from each food group.   Stress Management:  -Group instruction provided by verbal instruction, video, and written materials to support subject matter.  Instructors review role of stress in heart disease and how to cope with stress positively.     Exercising on Your Own:  -Group instruction provided by verbal instruction, power point, and written materials to support subject.  Instructors discuss benefits of exercise, components of exercise, frequency and intensity of exercise, and end points for exercise.  Also discuss use of nitroglycerin and activating EMS.  Review options of places to exercise outside of rehab.  Review guidelines for sex with heart disease.   Cardiac Drugs I:  -Group instruction provided by verbal instruction and written materials to support subject.  Instructor reviews cardiac drug classes: antiplatelets, anticoagulants, beta blockers, and statins.  Instructor discusses reasons, side effects, and lifestyle considerations for each drug class.   Cardiac Drugs II:  -Group instruction provided by verbal instruction and written materials to support subject.  Instructor reviews cardiac drug classes: angiotensin converting enzyme inhibitors (ACE-I), angiotensin II receptor blockers (ARBs), nitrates, and calcium channel blockers.  Instructor discusses reasons, side effects, and lifestyle  considerations for each drug class.   Anatomy and Physiology of the Circulatory System:  Group verbal and written instruction and models provide basic cardiac anatomy and physiology, with the coronary electrical and arterial systems. Review of: AMI, Angina, Valve disease, Heart Failure, Peripheral Artery Disease, Cardiac Arrhythmia, Pacemakers, and the ICD.   Other Education:  -  Group or individual verbal, written, or video instructions that support the educational goals of the cardiac rehab program.   Holiday Eating Survival Tips:  -Group instruction provided by PowerPoint slides, verbal discussion, and written materials to support subject matter. The instructor gives patients tips, tricks, and techniques to help them not only survive but enjoy the holidays despite the onslaught of food that accompanies the holidays.   Knowledge Questionnaire Score: Knowledge Questionnaire Score - 03/30/19 1537      Knowledge Questionnaire Score   Pre Score  20/24       Core Components/Risk Factors/Patient Goals at Admission: Personal Goals and Risk Factors at Admission - 03/30/19 1543      Core Components/Risk Factors/Patient Goals on Admission    Weight Management  Yes;Weight Maintenance    Intervention  Weight Management: Develop a combined nutrition and exercise program designed to reach desired caloric intake, while maintaining appropriate intake of nutrient and fiber, sodium and fats, and appropriate energy expenditure required for the weight goal.;Weight Management: Provide education and appropriate resources to help participant work on and attain dietary goals.    Admit Weight  116 lb 6.5 oz (52.8 kg)    Expected Outcomes  Short Term: Continue to assess and modify interventions until short term weight is achieved;Long Term: Adherence to nutrition and physical activity/exercise program aimed toward attainment of established weight goal;Weight Maintenance: Understanding of the daily nutrition  guidelines, which includes 25-35% calories from fat, 7% or less cal from saturated fats, less than 200mg  cholesterol, less than 1.5gm of sodium, & 5 or more servings of fruits and vegetables daily;Understanding recommendations for meals to include 15-35% energy as protein, 25-35% energy from fat, 35-60% energy from carbohydrates, less than 200mg  of dietary cholesterol, 20-35 gm of total fiber daily;Understanding of distribution of calorie intake throughout the day with the consumption of 4-5 meals/snacks    Improve shortness of breath with ADL's  Yes    Intervention  Provide education, individualized exercise plan and daily activity instruction to help decrease symptoms of SOB with activities of daily living.    Expected Outcomes  Short Term: Improve cardiorespiratory fitness to achieve a reduction of symptoms when performing ADLs;Long Term: Be able to perform more ADLs without symptoms or delay the onset of symptoms    Hypertension  Yes    Intervention  Provide education on lifestyle modifcations including regular physical activity/exercise, weight management, moderate sodium restriction and increased consumption of fresh fruit, vegetables, and low fat dairy, alcohol moderation, and smoking cessation.;Monitor prescription use compliance.    Expected Outcomes  Short Term: Continued assessment and intervention until BP is < 140/24mm HG in hypertensive participants. < 130/14mm HG in hypertensive participants with diabetes, heart failure or chronic kidney disease.;Long Term: Maintenance of blood pressure at goal levels.    Lipids  Yes    Intervention  Provide education and support for participant on nutrition & aerobic/resistive exercise along with prescribed medications to achieve LDL 70mg , HDL >40mg .    Expected Outcomes  Short Term: Participant states understanding of desired cholesterol values and is compliant with medications prescribed. Participant is following exercise prescription and nutrition  guidelines.;Long Term: Cholesterol controlled with medications as prescribed, with individualized exercise RX and with personalized nutrition plan. Value goals: LDL < 70mg , HDL > 40 mg.    Stress  Yes    Intervention  Offer individual and/or small group education and counseling on adjustment to heart disease, stress management and health-related lifestyle change. Teach and support self-help strategies.;Refer participants experiencing  significant psychosocial distress to appropriate mental health specialists for further evaluation and treatment. When possible, include family members and significant others in education/counseling sessions.    Expected Outcomes  Short Term: Participant demonstrates changes in health-related behavior, relaxation and other stress management skills, ability to obtain effective social support, and compliance with psychotropic medications if prescribed.;Long Term: Emotional wellbeing is indicated by absence of clinically significant psychosocial distress or social isolation.       Core Components/Risk Factors/Patient Goals Review:    Core Components/Risk Factors/Patient Goals at Discharge (Final Review):    ITP Comments: ITP Comments    Row Name 03/30/19 1418           ITP Comments  Dr. Fransico Him, Medical Director          Comments: Patient attended orientation on 03/30/2019 to review rules and guidelines for program.  Completed 6 minute walk test, Intitial ITP, and exercise prescription.  VSS. Telemetry-SR with [facility].  Asymptomatic. Safety measures and social distancing in place per CDC guidelines.

## 2019-04-01 ENCOUNTER — Other Ambulatory Visit: Payer: Self-pay | Admitting: Surgical

## 2019-04-03 ENCOUNTER — Encounter (HOSPITAL_COMMUNITY)
Admission: RE | Admit: 2019-04-03 | Discharge: 2019-04-03 | Disposition: A | Discharge status: Home / Self Care | Payer: Medicare Other | Source: Ambulatory Visit | Attending: Cardiovascular Disease | Admitting: Cardiovascular Disease

## 2019-04-03 ENCOUNTER — Other Ambulatory Visit: Payer: Self-pay

## 2019-04-03 DIAGNOSIS — Z952 Presence of prosthetic heart valve: Secondary | ICD-10-CM | POA: Diagnosis not present

## 2019-04-03 NOTE — Progress Notes (Signed)
Daily Session Note  Patient Details  Name: Dana Pruitt MRN: [id number] Date of Birth: 01/18/1941 Referring Provider:     CARDIAC REHAB PHASE II ORIENTATION from 03/30/2019 in [facility]  Referring Provider  Dr. [name]      Encounter Date: 04/03/2019  Check In: Session Check In - 04/03/19 1343      Check-In   Supervising physician immediately available to respond to emergencies  [facility] immediately available    Physician(s)  Dr. Doristine Bosworth    Location  [facility]    Staff Present  Deitra Mayo, BS, ACSM CEP, Exercise Physiologist;Maria [place], RN, Maxcine Ham, RN, Deland Pretty, MS, ACSM CEP, Exercise Physiologist;Amalya Salmons Rollene Rotunda, RN, BSN    Virtual Visit  No    Medication changes reported      No    Fall or balance concerns reported     No    Tobacco Cessation  No Change    Warm-up and Cool-down  Performed on first and last piece of equipment    Resistance Training Performed  Yes    VAD Patient?  No    PAD/SET Patient?  No      Pain Assessment   Currently in Pain?  No/denies    Multiple Pain Sites  No       Capillary Blood Glucose: No results found for this or any previous visit (from the past 24 hour(s)).  Exercise Prescription Changes - 04/03/19 1500      Response to Exercise   Blood Pressure (Admit)  100/62    Blood Pressure (Exercise)  110/70    Blood Pressure (Exit)  110/62    Heart Rate (Admit)  86 bpm    Heart Rate (Exercise)  86 bpm    Heart Rate (Exit)  82 bpm    Rating of Perceived Exertion (Exercise)  12    Perceived Dyspnea (Exercise)  0    Symptoms  None    Comments  Pt first day of exercise.     Duration  Continue with 30 min of aerobic exercise without signs/symptoms of physical distress.    Intensity  THRR unchanged      Progression   Progression  Continue to progress workloads to maintain intensity without signs/symptoms of physical distress.    Average METs  2      Resistance Training   Training Prescription  Yes    Weight  2 lbs.     Reps  10-15    Time  10 Minutes      Interval Training   Interval Training  No      NuStep   Level  2    SPM  75    Minutes  15    METs  2      Arm Ergometer   Level  1    Watts  15    Minutes  15    METs  2.57       Social History   Tobacco Use  Smoking Status Former Smoker  . Packs/day: 3.00  . Years: 30.00  . Pack years: 90.00  . Types: Cigarettes  . Start date: 05/19/1958  . Quit date: 06/30/1990  . Years since quitting: 28.7  Smokeless Tobacco Never Used  Tobacco Comment   Stopped smoking for 2-3 years during second child.     Goals Met:  Exercise tolerated well No report of cardiac concerns or symptoms Strength training completed today  Goals Unmet:  Not  Applicable  Comments: Pt started cardiac rehab today.  Pt tolerated light exercise without difficulty. VSS, telemetry-NSR, asymptomatic.  Medication list reconciled. Pt denies barriers to medicaiton compliance.  PSYCHOSOCIAL ASSESSMENT:  PHQ-0. Pt exhibits positive coping skills, hopeful outlook with supportive family. No psychosocial needs identified at this time, no psychosocial interventions necessary.  Pt oriented to exercise equipment and routine. Understanding verbalized.   Dr. Fransico Him is Medical Director for Cardiac Rehab at [facility].

## 2019-04-03 NOTE — Progress Notes (Signed)
Called and left message for Dana Pruitt with [facility], contact number [phone] regarding continuing with home health for INR lab draws. Pt had indicated to rehab staff that she has a nurse coming to the home on Wednesday for lab draw.   Concerned that medicare plans may not pay for home bound service and outpatient cardiac rehab at the same time.  Requested a call back.  Contact information provided. Cherre Huger, BSN Cardiac and Training and development officer

## 2019-04-04 ENCOUNTER — Other Ambulatory Visit: Payer: Self-pay | Admitting: Cardiovascular Disease

## 2019-04-04 ENCOUNTER — Telehealth (HOSPITAL_COMMUNITY): Payer: Self-pay | Admitting: *Deleted

## 2019-04-04 NOTE — Telephone Encounter (Signed)
[place] regarding message left for this pt home health provider.  Able to speak with [facility]. Plan to discharge pt from home bound services with tomorrow  INR lab draw. Called and spoke to pt.  Pt made aware of the plan for one remaining lab draw. Pt is permitted to start exercise in outpatient cardiac rehab program on Friday.  Pt verbalized understanding and appreciated my call. Cherre Huger, BSN Cardiac and Training and development officer

## 2019-04-05 ENCOUNTER — Ambulatory Visit: Payer: Self-pay | Admitting: Pharmacist

## 2019-04-05 ENCOUNTER — Encounter (HOSPITAL_COMMUNITY): Payer: Medicare Other

## 2019-04-05 ENCOUNTER — Telehealth: Payer: Self-pay | Admitting: Cardiovascular Disease

## 2019-04-05 DIAGNOSIS — I359 Nonrheumatic aortic valve disorder, unspecified: Secondary | ICD-10-CM

## 2019-04-05 DIAGNOSIS — Z7901 Long term (current) use of anticoagulants: Secondary | ICD-10-CM

## 2019-04-05 DIAGNOSIS — I4891 Unspecified atrial fibrillation: Secondary | ICD-10-CM

## 2019-04-05 DIAGNOSIS — I351 Nonrheumatic aortic (valve) insufficiency: Secondary | ICD-10-CM

## 2019-04-05 LAB — POCT INR: INR: 1.7 — AB (ref 2.0–3.0)

## 2019-04-05 NOTE — Telephone Encounter (Signed)
See anticoagulation note for details °

## 2019-04-05 NOTE — Telephone Encounter (Signed)
New message   Per Glenard Haring at [facility] INR is 1.7 on today. Please call with any changes  to medication to [facility] and also call the patient.

## 2019-04-06 ENCOUNTER — Telehealth (HOSPITAL_COMMUNITY): Payer: Self-pay

## 2019-04-06 ENCOUNTER — Encounter (HOSPITAL_COMMUNITY): Payer: Self-pay

## 2019-04-06 DIAGNOSIS — Z952 Presence of prosthetic heart valve: Secondary | ICD-10-CM

## 2019-04-06 NOTE — Progress Notes (Signed)
Cardiac Individual Treatment Plan  Patient Details  Name: Dana Pruitt MRN: [id number] Date of Birth: [birth year] Referring Provider:     CARDIAC REHAB PHASE II ORIENTATION from 03/30/2019 in [place]  Referring Provider  Dr. [name]      Initial Encounter Date:    CARDIAC REHAB PHASE II ORIENTATION from 03/30/2019 in [place]  Date  03/30/19      Visit Diagnosis: S/P AVR (aortic valve replacement)  Patient's Home Medications on Admission:  Current Outpatient Medications:  .  amiodarone (PACERONE) 400 MG tablet, Take 1 tablet (400 mg total) by mouth 2 (two) times daily. FOR 5 DAYS THEN ONCE DAILY (Patient not taking: Reported on 03/28/2019), Disp: 65 tablet, Rfl: 1 .  amoxicillin (AMOXIL) 500 MG tablet, TAKE 4 TABLETS 1 HOUR BEFORE DENTAL EXAMINATION (Patient not taking: Reported on 03/28/2019), Disp: 4 tablet, Rfl: 0 .  aspirin EC 81 MG tablet, Take 81 mg by mouth daily., Disp: , Rfl:  .  atorvastatin (LIPITOR) 20 MG tablet, Take 20 mg by mouth daily. , Disp: , Rfl:  .  carvedilol (COREG) 12.5 MG tablet, Take 1 tablet (12.5 mg total) by mouth 2 (two) times daily with a meal., Disp: 60 tablet, Rfl: 1 .  carvedilol (COREG) 6.25 MG tablet, TAKE 1 TABLET(6.25 MG) BY MOUTH TWICE DAILY WITH A MEAL, Disp: 180 tablet, Rfl: 1 .  cholecalciferol (VITAMIN D3) 25 MCG (1000 UT) tablet, Take 1,000 Units by mouth daily., Disp: , Rfl:  .  denosumab (PROLIA) 60 MG/ML SOSY injection, Inject 60 mg into the skin every 6 (six) months., Disp: , Rfl:  .  guaiFENesin (MUCINEX) 600 MG 12 hr tablet, Take 1 tablet (600 mg total) by mouth 2 (two) times daily., Disp: 30 tablet, Rfl: 0 .  lisinopril (ZESTRIL) 40 MG tablet, Take 1 tablet (40 mg total) by mouth daily., Disp: 30 tablet, Rfl: 3 .  sertraline (ZOLOFT) 100 MG tablet, Take 100 mg by mouth 2 (two) times a day. , Disp: , Rfl:  .  vitamin B-12 (CYANOCOBALAMIN) 500 MCG tablet, Take 500 mcg by  mouth daily., Disp: , Rfl:  .  warfarin (COUMADIN) 2 MG tablet, Take 1.5 tablets (3 mg total) by mouth daily at 6 PM. AS DIRECTED BY THE [facility], Disp: 100 tablet, Rfl: 1 No current facility-administered medications for this visit.   Facility-Administered Medications Ordered in Other Visits:  .  sodium chloride flush (NS) 0.9 % injection 3 mL, 3 mL, Intravenous, Q12H, Larey Dresser, MD  Past Medical History: Past Medical History:  Diagnosis Date  . Anemia    "several times over the years" (10/17/2012)  . Anxiety   . Breast cancer ([place]) 02/2010   right breast  . Carcinoma of breast treated with adjuvant hormone therapy ([place]) 9/11   [facility]  . CHF (congestive heart failure) ([place])   . COPD (chronic obstructive pulmonary disease) ([place])   . Depression   . Exertional shortness of breath    "recently" (10/17/2012)  . GERD (gastroesophageal reflux disease)    diet controlled, no med  . Hollenhorst plaque, left eye    apparently resolved at recent ophthalmology visit  . Hyperlipidemia   . Hypertension   . 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 Left ventricular dysfunction   . Moderate aortic insufficiency   . Pneumonia    "that's why I'm here" (10/17/2012)    Tobacco Use: Social History   Tobacco Use  Smoking Status Former Smoker  . Packs/day: 3.00  . Years: 30.00  . Pack years: 90.00  . Types: Cigarettes  . Start date: 05/19/1958  . Quit date: 06/30/1990  . Years since quitting: 28.7  Smokeless Tobacco Never Used  Tobacco Comment   Stopped smoking for 2-3 years during second child.     Labs: Recent Review Flowsheet Data    Labs for ITP Cardiac and Pulmonary Rehab Latest Ref Rng & Units 01/31/2019 02/01/2019 02/01/2019 02/01/2019 02/02/2019   Cholestrol 0 - 200 mg/dL - - - - -   LDLCALC 0 - 99 mg/dL - - - - -   HDL >40 mg/dL - - - - -   Trlycerides <150 mg/dL - - - - -   Hemoglobin A1c 4.8 - 5.6 % - - - - -   PHART 7.350 - 7.450 - 7.356 7.310(L) 7.317(L) -    PCO2ART 32.0 - 48.0 mmHg - 36.7 43.5 41.0 -   HCO3 20.0 - 28.0 mmol/L - 20.7 22.0 21.1 -   TCO2 22 - 32 mmol/L 24 22 23 22  -   ACIDBASEDEF 0.0 - 2.0 mmol/L - 5.0(H) 4.0(H) 5.0(H) -   O2SAT % - 90.0 93.0 89.0 63.6      Capillary Blood Glucose: Lab Results  Component Value Date   GLUCAP 151 (H) 02/04/2019   GLUCAP 98 02/04/2019   GLUCAP 95 02/04/2019   GLUCAP 130 (H) 02/03/2019   GLUCAP 101 (H) 02/03/2019     Exercise Target Goals: Exercise Program Goal: Individual exercise prescription set using results from initial 6 min walk test and THRR while considering  patient's activity barriers and safety.   Exercise Prescription Goal: Starting with aerobic activity 30 plus minutes a day, 3 days per week for initial exercise prescription. Provide home exercise prescription and guidelines that participant acknowledges understanding prior to discharge.  Activity Barriers & Risk Stratification: Activity Barriers & Cardiac Risk Stratification - 03/30/19 1538      Activity Barriers & Cardiac Risk Stratification   Activity Barriers  Deconditioning;Muscular Weakness;Shortness of Breath    Cardiac Risk Stratification  High       6 Minute Walk: 6 Minute Walk    Row Name 03/30/19 1535         6 Minute Walk   Phase  Initial     Distance  1206 feet     Walk Time  6 minutes     # of Rest Breaks  1 Pt O2 level dropped to 86% on RA. Pt rested for 45 seconds on 2L O2 until O2 increased to 96%.     MPH  2.2     METS  2.6     RPE  13     Perceived Dyspnea   3     VO2 Peak  9.4     Symptoms  Yes (comment)     Comments  SOB +3, Pt O2 started at 94% RA. At minute 2:40 Pt O2 was 86%. 2L of O2 was added and after 45 seconds Pt O2 was 96%. Pt finished walk test on 2L O2.     Resting HR  73 bpm     Resting BP  104/72     Resting Oxygen Saturation   94 %     Exercise Oxygen Saturation  during 6 min walk  97 %     Max Ex. HR  96 bpm     Max Ex. BP  128/72     2 Minute  Post BP  112/70         Oxygen Initial Assessment:   Oxygen Re-Evaluation:   Oxygen Discharge (Final Oxygen Re-Evaluation):   Initial Exercise Prescription: Initial Exercise Prescription - 03/30/19 1500      Date of Initial Exercise RX and Referring Provider   Date  03/30/19    Referring Provider  Dr. [name]    Expected Discharge Date  05/26/19      NuStep   Level  2    SPM  75    Minutes  15    METs  2      Arm Ergometer   Level  1    Watts  15    Minutes  15    METs  2.57      Prescription Details   Frequency (times per week)  3    Duration  Progress to 30 minutes of continuous aerobic without signs/symptoms of physical distress      Intensity   THRR 40-80% of Max Heartrate  58-115    Ratings of Perceived Exertion  11-13    Perceived Dyspnea  0-4      Progression   Progression  Continue to progress workloads to maintain intensity without signs/symptoms of physical distress.      Resistance Training   Training Prescription  Yes    Weight  2 lbs.     Reps  10-15       Perform Capillary Blood Glucose checks as needed.  Exercise Prescription Changes: Exercise Prescription Changes    Row Name 04/03/19 1500             Response to Exercise   Blood Pressure (Admit)  100/62       Blood Pressure (Exercise)  110/70       Blood Pressure (Exit)  110/62       Heart Rate (Admit)  86 bpm       Heart Rate (Exercise)  86 bpm       Heart Rate (Exit)  82 bpm       Rating of Perceived Exertion (Exercise)  12       Perceived Dyspnea (Exercise)  0       Symptoms  None       Comments  Pt first day of exercise.        Duration  Continue with 30 min of aerobic exercise without signs/symptoms of physical distress.       Intensity  THRR unchanged         Progression   Progression  Continue to progress workloads to maintain intensity without signs/symptoms of physical distress.       Average METs  2         Resistance Training   Training Prescription  Yes       Weight  2 lbs.         Reps  10-15       Time  10 Minutes         Interval Training   Interval Training  No         NuStep   Level  2       SPM  75       Minutes  15       METs  2         Arm Ergometer   Level  1       Watts  15       Minutes  15  METs  2.57          Exercise Comments: Exercise Comments    Row Name 04/03/19 1510           Exercise Comments  Pt first day of CR program. Pt tolerated exercise Rx well.          Exercise Goals and Review: Exercise Goals    Row Name 03/30/19 1540             Exercise Goals   Increase Physical Activity  Yes       Intervention  Provide advice, education, support and counseling about physical activity/exercise needs.;Develop an individualized exercise prescription for aerobic and resistive training based on initial evaluation findings, risk stratification, comorbidities and participant's personal goals.       Expected Outcomes  Short Term: Attend rehab on a regular basis to increase amount of physical activity.;Long Term: Add in home exercise to make exercise part of routine and to increase amount of physical activity.;Long Term: Exercising regularly at least 3-5 days a week.       Increase Strength and Stamina  Yes       Intervention  Provide advice, education, support and counseling about physical activity/exercise needs.;Develop an individualized exercise prescription for aerobic and resistive training based on initial evaluation findings, risk stratification, comorbidities and participant's personal goals.       Expected Outcomes  Short Term: Increase workloads from initial exercise prescription for resistance, speed, and METs.;Short Term: Perform resistance training exercises routinely during rehab and add in resistance training at home;Long Term: Improve cardiorespiratory fitness, muscular endurance and strength as measured by increased METs and functional capacity (6MWT)       Able to understand and use rate of perceived exertion (RPE)  scale  Yes       Intervention  Provide education and explanation on how to use RPE scale       Expected Outcomes  Short Term: Able to use RPE daily in rehab to express subjective intensity level;Long Term:  Able to use RPE to guide intensity level when exercising independently       Able to understand and use Dyspnea scale  Yes       Intervention  Provide education and explanation on how to use Dyspnea scale       Expected Outcomes  Short Term: Able to use Dyspnea scale daily in rehab to express subjective sense of shortness of breath during exertion;Long Term: Able to use Dyspnea scale to guide intensity level when exercising independently       Knowledge and understanding of Target Heart Rate Range (THRR)  Yes       Intervention  Provide education and explanation of THRR including how the numbers were predicted and where they are located for reference       Expected Outcomes  Short Term: Able to state/look up THRR;Long Term: Able to use THRR to govern intensity when exercising independently;Short Term: Able to use daily as guideline for intensity in rehab       Able to check pulse independently  Yes       Intervention  Provide education and demonstration on how to check pulse in carotid and radial arteries.;Review the importance of being able to check your own pulse for safety during independent exercise       Expected Outcomes  Short Term: Able to explain why pulse checking is important during independent exercise;Long Term: Able to check pulse independently and accurately  Understanding of Exercise Prescription  Yes       Intervention  Provide education, explanation, and written materials on patient's individual exercise prescription       Expected Outcomes  Short Term: Able to explain program exercise prescription;Long Term: Able to explain home exercise prescription to exercise independently          Exercise Goals Re-Evaluation : Exercise Goals Re-Evaluation    [place] Name 04/03/19 1508              Exercise Goal Re-Evaluation   Exercise Goals Review  Understanding of Exercise Prescription;Increase Physical Activity;Able to understand and use Dyspnea scale;Increase Strength and Stamina;Knowledge and understanding of Target Heart Rate Range (THRR);Able to understand and use rate of perceived exertion (RPE) scale       Comments  Pt first day of CR program. Pt tolerated exercise well. Pt used 2L of O2 during exercise and O2 levels were 98% with exercise. Pt understands RPE scale, THRR, Dyspnea scale, and exercise Rx.       Expected Outcomes  Will continue to monitor and progress Pt as tolerated.           Discharge Exercise Prescription (Final Exercise Prescription Changes): Exercise Prescription Changes - 04/03/19 1500      Response to Exercise   Blood Pressure (Admit)  100/62    Blood Pressure (Exercise)  110/70    Blood Pressure (Exit)  110/62    Heart Rate (Admit)  86 bpm    Heart Rate (Exercise)  86 bpm    Heart Rate (Exit)  82 bpm    Rating of Perceived Exertion (Exercise)  12    Perceived Dyspnea (Exercise)  0    Symptoms  None    Comments  Pt first day of exercise.     Duration  Continue with 30 min of aerobic exercise without signs/symptoms of physical distress.    Intensity  THRR unchanged      Progression   Progression  Continue to progress workloads to maintain intensity without signs/symptoms of physical distress.    Average METs  2      Resistance Training   Training Prescription  Yes    Weight  2 lbs.     Reps  10-15    Time  10 Minutes      Interval Training   Interval Training  No      NuStep   Level  2    SPM  75    Minutes  15    METs  2      Arm Ergometer   Level  1    Watts  15    Minutes  15    METs  2.57       Nutrition:  Target Goals: Understanding of nutrition guidelines, daily intake of sodium 1500mg , cholesterol 200mg , calories 30% from fat and 7% or less from saturated fats, daily to have 5 or more servings of  fruits and vegetables.  Biometrics: Pre Biometrics - 03/30/19 1540      Pre Biometrics   Height  5\' 4"  (1.626 m)    Weight  116 lb 6.5 oz (52.8 kg)    Waist Circumference  31 inches    Hip Circumference  36 inches    Waist to Hip Ratio  0.86 %    BMI (Calculated)  19.97    Triceps Skinfold  20 mm    % Body Fat  32.5 %    Grip Strength  29 kg  Flexibility  16.5 in    Single Leg Stand  30 seconds        Nutrition Therapy Plan and Nutrition Goals:   Nutrition Assessments:   Nutrition Goals Re-Evaluation:   Nutrition Goals Discharge (Final Nutrition Goals Re-Evaluation):   Psychosocial: Target Goals: Acknowledge presence or absence of significant depression and/or stress, maximize coping skills, provide positive support system. Participant is able to verbalize types and ability to use techniques and skills needed for reducing stress and depression.  Initial Review & Psychosocial Screening: Initial Psych Review & Screening - 03/30/19 1538      Initial Review   Current issues with  None Identified   Tabaitha has a history of depression but does not report any issues currently.     Family Dynamics   Good Support System?  Yes   Pt reports that her husband is a source of support for her.     Barriers   Psychosocial barriers to participate in program  There are no identifiable barriers or psychosocial needs.       Quality of Life Scores: Quality of Life - 03/30/19 1537      Quality of Life   Select  Quality of Life      Quality of Life Scores   Health/Function Pre  27.43 %    Socioeconomic Pre  30 %    Psych/Spiritual Pre  28 %    Family Pre  30 %    GLOBAL Pre  28.45 %      Scores of 19 and below usually indicate a poorer quality of life in these areas.  A difference of  2-3 points is a clinically meaningful difference.  A difference of 2-3 points in the total score of the Quality of Life Index has been associated with significant improvement in overall  quality of life, self-image, physical symptoms, and general health in studies assessing change in quality of life.  PHQ-9: Recent Review Flowsheet Data    Depression screen [facility] 2/9 04/03/2019   Decreased Interest 0   Down, Depressed, Hopeless 0   PHQ - 2 Score 0     Interpretation of Total Score  Total Score Depression Severity:  1-4 = Minimal depression, 5-9 = Mild depression, 10-14 = Moderate depression, 15-19 = Moderately severe depression, 20-27 = Severe depression   Psychosocial Evaluation and Intervention: Psychosocial Evaluation - 04/03/19 1649      Psychosocial Evaluation & Interventions   Interventions  Encouraged to exercise with the program and follow exercise prescription    Comments  Patient denies psychosocial barriers to exercise in CR or self health management. She acknolodges a strong support system of family, friends, and health care providers. She maintains a positive attitude and coping skills. Patient enjoys reading and gardening.    Expected Outcomes  Patient will continue to utilize her support system, maintain positive attitude, and healthy stress management skills if psychosocial barriers arise    Continue Psychosocial Services   No Follow up required       Psychosocial Re-Evaluation:   Psychosocial Discharge (Final Psychosocial Re-Evaluation):   Vocational Rehabilitation: Provide vocational rehab assistance to qualifying candidates.   Vocational Rehab Evaluation & Intervention: Vocational Rehab - 03/30/19 1552      Initial Vocational Rehab Evaluation & Intervention   Assessment shows need for Vocational Rehabilitation  No       Education: Education Goals: Education classes will be provided on a weekly basis, covering required topics. Participant will state understanding/return demonstration of topics presented.  Learning Barriers/Preferences: Learning Barriers/Preferences - 03/30/19 1542      Learning Barriers/Preferences   Learning Barriers   None    Learning Preferences  Skilled Demonstration       Education Topics: Hypertension, Hypertension Reduction -Define heart disease and high blood pressure. Discus how high blood pressure affects the body and ways to reduce high blood pressure.   Exercise and Your Heart -Discuss why it is important to exercise, the FITT principles of exercise, normal and abnormal responses to exercise, and how to exercise safely.   Angina -Discuss definition of angina, causes of angina, treatment of angina, and how to decrease risk of having angina.   Cardiac Medications -Review what the following cardiac medications are used for, how they affect the body, and side effects that may occur when taking the medications.  Medications include Aspirin, Beta blockers, calcium channel blockers, ACE Inhibitors, angiotensin receptor blockers, diuretics, digoxin, and antihyperlipidemics.   Congestive Heart Failure -Discuss the definition of CHF, how to live with CHF, the signs and symptoms of CHF, and how keep track of weight and sodium intake.   Heart Disease and Intimacy -Discus the effect sexual activity has on the heart, how changes occur during intimacy as we age, and safety during sexual activity.   Smoking Cessation / COPD -Discuss different methods to quit smoking, the health benefits of quitting smoking, and the definition of COPD.   Nutrition I: Fats -Discuss the types of cholesterol, what cholesterol does to the heart, and how cholesterol levels can be controlled.   Nutrition II: Labels -Discuss the different components of food labels and how to read food label   Heart Parts/Heart Disease and PAD -Discuss the anatomy of the heart, the pathway of blood circulation through the heart, and these are affected by heart disease.   Stress I: Signs and Symptoms -Discuss the causes of stress, how stress may lead to anxiety and depression, and ways to limit stress.   Stress II:  Relaxation -Discuss different types of relaxation techniques to limit stress.   Warning Signs of Stroke / TIA -Discuss definition of a stroke, what the signs and symptoms are of a stroke, and how to identify when someone is having stroke.   Knowledge Questionnaire Score: Knowledge Questionnaire Score - 03/30/19 1537      Knowledge Questionnaire Score   Pre Score  20/24       Core Components/Risk Factors/Patient Goals at Admission: Personal Goals and Risk Factors at Admission - 03/30/19 1543      Core Components/Risk Factors/Patient Goals on Admission    Weight Management  Yes;Weight Maintenance    Intervention  Weight Management: Develop a combined nutrition and exercise program designed to reach desired caloric intake, while maintaining appropriate intake of nutrient and fiber, sodium and fats, and appropriate energy expenditure required for the weight goal.;Weight Management: Provide education and appropriate resources to help participant work on and attain dietary goals.    Admit Weight  116 lb 6.5 oz (52.8 kg)    Expected Outcomes  Short Term: Continue to assess and modify interventions until short term weight is achieved;Long Term: Adherence to nutrition and physical activity/exercise program aimed toward attainment of established weight goal;Weight Maintenance: Understanding of the daily nutrition guidelines, which includes 25-35% calories from fat, 7% or less cal from saturated fats, less than 200mg  cholesterol, less than 1.5gm of sodium, & 5 or more servings of fruits and vegetables daily;Understanding recommendations for meals to include 15-35% energy as protein, 25-35% energy from fat, 35-60%  energy from carbohydrates, less than 200mg  of dietary cholesterol, 20-35 gm of total fiber daily;Understanding of distribution of calorie intake throughout the day with the consumption of 4-5 meals/snacks    Improve shortness of breath with ADL's  Yes    Intervention  Provide education,  individualized exercise plan and daily activity instruction to help decrease symptoms of SOB with activities of daily living.    Expected Outcomes  Short Term: Improve cardiorespiratory fitness to achieve a reduction of symptoms when performing ADLs;Long Term: Be able to perform more ADLs without symptoms or delay the onset of symptoms    Hypertension  Yes    Intervention  Provide education on lifestyle modifcations including regular physical activity/exercise, weight management, moderate sodium restriction and increased consumption of fresh fruit, vegetables, and low fat dairy, alcohol moderation, and smoking cessation.;Monitor prescription use compliance.    Expected Outcomes  Short Term: Continued assessment and intervention until BP is < 140/38mm HG in hypertensive participants. < 130/38mm HG in hypertensive participants with diabetes, heart failure or chronic kidney disease.;Long Term: Maintenance of blood pressure at goal levels.    Lipids  Yes    Intervention  Provide education and support for participant on nutrition & aerobic/resistive exercise along with prescribed medications to achieve LDL 70mg , HDL >40mg .    Expected Outcomes  Short Term: Participant states understanding of desired cholesterol values and is compliant with medications prescribed. Participant is following exercise prescription and nutrition guidelines.;Long Term: Cholesterol controlled with medications as prescribed, with individualized exercise RX and with personalized nutrition plan. Value goals: LDL < 70mg , HDL > 40 mg.    Stress  Yes    Intervention  Offer individual and/or small group education and counseling on adjustment to heart disease, stress management and health-related lifestyle change. Teach and support self-help strategies.;Refer participants experiencing significant psychosocial distress to appropriate mental health specialists for further evaluation and treatment. When possible, include family members and  significant others in education/counseling sessions.    Expected Outcomes  Short Term: Participant demonstrates changes in health-related behavior, relaxation and other stress management skills, ability to obtain effective social support, and compliance with psychotropic medications if prescribed.;Long Term: Emotional wellbeing is indicated by absence of clinically significant psychosocial distress or social isolation.       Core Components/Risk Factors/Patient Goals Review:  Goals and Risk Factor Review    Row Name 04/03/19 1652             Core Components/Risk Factors/Patient Goals Review   Personal Goals Review  Weight Management/Obesity;Stress;Lipids;Improve shortness of breath with ADL's;Hypertension       Review  Patient with multiple CAD risk factors. She is eager to participate in CR. Her personal goals are to improve her O2 levels through exercise.       Expected Outcomes  Patient will continue to participate in CR to modify risk factors and increase stamina and strength          Core Components/Risk Factors/Patient Goals at Discharge (Final Review):  Goals and Risk Factor Review - 04/03/19 1652      Core Components/Risk Factors/Patient Goals Review   Personal Goals Review  Weight Management/Obesity;Stress;Lipids;Improve shortness of breath with ADL's;Hypertension    Review  Patient with multiple CAD risk factors. She is eager to participate in CR. Her personal goals are to improve her O2 levels through exercise.    Expected Outcomes  Patient will continue to participate in CR to modify risk factors and increase stamina and strength  ITP Comments: ITP Comments    Row Name 03/30/19 1418 04/03/19 1646         ITP Comments  Dr. Fransico Him, Medical Director  30 day ITP review: Patient started Cardiac Rehab today and tolerated well. 02 at 2 LPM was utilized to keep sats >90% per cardiology orders.         Comments: See ITP comments

## 2019-04-06 NOTE — Telephone Encounter (Signed)
Telephone call to PCP on patients behalf to discuss possible referral to pulmonologist secondary to her need for exertional oxygen during cardiac rehab exercise and diagnosis of COPD. Message left requesting call back at [phone]. Madelina Sanda E. Rollene Rotunda, RN BSN

## 2019-04-07 ENCOUNTER — Other Ambulatory Visit: Payer: Self-pay

## 2019-04-07 ENCOUNTER — Encounter (HOSPITAL_COMMUNITY)
Admission: RE | Admit: 2019-04-07 | Discharge: 2019-04-07 | Disposition: A | Discharge status: Home / Self Care | Payer: Medicare Other | Source: Ambulatory Visit | Attending: Cardiovascular Disease | Admitting: Cardiovascular Disease

## 2019-04-07 DIAGNOSIS — Z952 Presence of prosthetic heart valve: Secondary | ICD-10-CM

## 2019-04-10 ENCOUNTER — Other Ambulatory Visit: Payer: Self-pay

## 2019-04-10 ENCOUNTER — Encounter (HOSPITAL_COMMUNITY)
Admission: RE | Admit: 2019-04-10 | Discharge: 2019-04-10 | Disposition: A | Discharge status: Home / Self Care | Payer: Medicare Other | Source: Ambulatory Visit | Attending: Cardiovascular Disease | Admitting: Cardiovascular Disease

## 2019-04-10 DIAGNOSIS — Z952 Presence of prosthetic heart valve: Secondary | ICD-10-CM

## 2019-04-11 ENCOUNTER — Other Ambulatory Visit: Payer: Self-pay | Admitting: Cardiovascular Disease

## 2019-04-11 ENCOUNTER — Ambulatory Visit (INDEPENDENT_AMBULATORY_CARE_PROVIDER_SITE_OTHER): Payer: Medicare Other | Admitting: Pharmacist

## 2019-04-11 DIAGNOSIS — Z7901 Long term (current) use of anticoagulants: Secondary | ICD-10-CM

## 2019-04-11 DIAGNOSIS — I4891 Unspecified atrial fibrillation: Secondary | ICD-10-CM | POA: Diagnosis not present

## 2019-04-11 DIAGNOSIS — I359 Nonrheumatic aortic valve disorder, unspecified: Secondary | ICD-10-CM

## 2019-04-11 LAB — POCT INR: INR: 1.8 — AB (ref 2.0–3.0)

## 2019-04-11 MED ORDER — CARVEDILOL 12.5 MG PO TABS: 12.5000 mg | ORAL_TABLET | Freq: Two times a day (BID) | ORAL | 1 refills | Status: DC

## 2019-04-12 ENCOUNTER — Other Ambulatory Visit: Payer: Self-pay

## 2019-04-12 ENCOUNTER — Encounter (HOSPITAL_COMMUNITY)
Admission: RE | Admit: 2019-04-12 | Discharge: 2019-04-12 | Disposition: A | Discharge status: Home / Self Care | Payer: Medicare Other | Source: Ambulatory Visit | Attending: Cardiovascular Disease | Admitting: Cardiovascular Disease

## 2019-04-12 DIAGNOSIS — Z952 Presence of prosthetic heart valve: Secondary | ICD-10-CM | POA: Diagnosis not present

## 2019-04-14 ENCOUNTER — Other Ambulatory Visit: Payer: Self-pay

## 2019-04-14 ENCOUNTER — Encounter (HOSPITAL_COMMUNITY)
Admission: RE | Admit: 2019-04-14 | Discharge: 2019-04-14 | Disposition: A | Discharge status: Home / Self Care | Payer: Medicare Other | Source: Ambulatory Visit | Attending: Cardiovascular Disease | Admitting: Cardiovascular Disease

## 2019-04-14 DIAGNOSIS — Z952 Presence of prosthetic heart valve: Secondary | ICD-10-CM

## 2019-04-17 ENCOUNTER — Other Ambulatory Visit: Payer: Self-pay

## 2019-04-17 ENCOUNTER — Encounter (HOSPITAL_COMMUNITY)
Admission: RE | Admit: 2019-04-17 | Discharge: 2019-04-17 | Disposition: A | Discharge status: Home / Self Care | Payer: Medicare Other | Source: Ambulatory Visit | Attending: Cardiovascular Disease | Admitting: Cardiovascular Disease

## 2019-04-17 DIAGNOSIS — Z952 Presence of prosthetic heart valve: Secondary | ICD-10-CM | POA: Diagnosis not present

## 2019-04-18 DIAGNOSIS — J449 Chronic obstructive pulmonary disease, unspecified: Secondary | ICD-10-CM | POA: Diagnosis not present

## 2019-04-19 ENCOUNTER — Other Ambulatory Visit: Payer: Self-pay

## 2019-04-19 ENCOUNTER — Encounter (HOSPITAL_COMMUNITY)
Admission: RE | Admit: 2019-04-19 | Discharge: 2019-04-19 | Disposition: A | Discharge status: Home / Self Care | Payer: Medicare Other | Source: Ambulatory Visit | Attending: Cardiovascular Disease | Admitting: Cardiovascular Disease

## 2019-04-19 ENCOUNTER — Ambulatory Visit: Payer: Medicare Other | Admitting: Internal Medicine

## 2019-04-19 ENCOUNTER — Encounter: Payer: Self-pay | Admitting: Internal Medicine

## 2019-04-19 DIAGNOSIS — J449 Chronic obstructive pulmonary disease, unspecified: Secondary | ICD-10-CM

## 2019-04-19 DIAGNOSIS — R0600 Dyspnea, unspecified: Secondary | ICD-10-CM

## 2019-04-19 DIAGNOSIS — J9611 Chronic respiratory failure with hypoxia: Secondary | ICD-10-CM | POA: Diagnosis not present

## 2019-04-19 DIAGNOSIS — I10 Essential (primary) hypertension: Secondary | ICD-10-CM

## 2019-04-19 DIAGNOSIS — R0609 Other forms of dyspnea: Secondary | ICD-10-CM | POA: Insufficient documentation

## 2019-04-19 DIAGNOSIS — Z952 Presence of prosthetic heart valve: Secondary | ICD-10-CM

## 2019-04-19 MED ORDER — TELMISARTAN 80 MG PO TABS: 80.0000 mg | ORAL_TABLET | Freq: Every day | ORAL | 11 refills | Status: DC

## 2019-04-19 NOTE — Assessment & Plan Note (Addendum)
Try off acei 04/19/2019 for hoarseness, doe    In the best review of chronic cough to date ( NEJM 2016 375 [id number]) ,  ACEi are now felt to cause cough in up to  20% of pts which is a 4 fold increase from previous reports and does not include the variety of non-specific complaints we see in pulmonary clinic in pts on ACEi but previously attributed to another dx like  Copd/asthma and  include PNDS, throat and chest congestion, "bronchitis", unexplained dyspnea and noct "strangling" sensations, and hoarseness, but also  atypical /refractory GERD symptoms like dysphagia and "bad heartburn"   The only way I know  to prove this is not an "ACEi Case" is a trial off ACEi x a minimum of 4 weeks then regroup.   >>> try micardis 80 mg daily x 4 weeks then ov   Total time devoted to counseling  > 50 % of initial 60 min office visit:  review case with pt/ discussion of options/alternatives/ personally creating written customized instructions  in presence of pt  then going over those specific  Instructions directly with the pt including how to use all of the meds but in particular covering each new medication in detail and the difference between the maintenance= "automatic" meds and the prns using an action plan format for the latter (If this problem/symptom => do that organization reading Left to right).  Please see AVS from this visit for a full list of these instructions which I personally wrote for this pt and  are unique to this visit.

## 2019-04-19 NOTE — Assessment & Plan Note (Addendum)
D/c 01/31/2019  on 02 p AVR   Likely multifactorial with low dlco suggestive of component of emphysema or ILD   As of 04/19/2019 rx 2lpm hs and titrate with activity to keep sats > 90%

## 2019-04-19 NOTE — Assessment & Plan Note (Addendum)
Quit smoking 1992  01/04/17: FVC 2.17 L (79%) FEV1 1.32 L (64%) FEV1/FVC 0.61 FEF 25-75 0.57 L (34%) negative bronchodilator response TLC 5.23 L (105%) RV 136% ERV 39% DLCO corrected 43% - PFT's  01/05/2019  FEV1 1.30 (64 % ) ratio 0.62  p 6 % improvement from saba p ? prior to study with DLCO  7.27 (39%) corrects to 2.15 (52%)  for alv volume and FV curve classic concave pattern   Although she has copd with low dlco, I agee with Dr Ashok Cordia that more aggressive rx for copd not necessarily needed here as some of her symptoms strongly suggest uacs mimicking copd (worse sob walking if talking/ classic voice fatigue while on ACEi) and will focus on that first then add trial off lama/laba at next ov (stiolto being the least to affect the upper airway would be a good choice)

## 2019-04-19 NOTE — Assessment & Plan Note (Addendum)
S/p   Aortic valve replacement with a 21 mm Inspiris Edwards valve and desats with activity post op. Echo 01/31/19 post op POST-OP IMPRESSIONS - Left Ventricle: has mildly reduced systolic function, with an ejection fraction of 35-40%. - Aorta: The aorta appears unchanged from pre-bypass. - Left Atrial Appendage: The left atrial appendage appears unchanged from pre-bypass. - Aortic Valve: A bioprosthetic valve was placed, leaflets thin. No regurgitation post repair. The gradient recorded across the prosthetic valve is within the expected range. No perivalvular leak noted. - Mitral Valve: The mitral valve appears unchanged from pre-bypass. - Tricuspid Valve: The tricuspid valve appears unchanged from pre-bypass. - Interatrial Septum: The interatrial septum appears unchanged from pre-bypass. - Pericardium: The pericardium appears unchanged from pre-bypass   Symptoms are  disproportionate to objective findings and not clear to what extent this is actually a pulmonary  problem but pt does appear to have difficult to sort out respiratory symptoms of unknown origin for which  DDX  = almost all start with A and  include Adherence, Ace Inhibitors, Acid Reflux, Active Sinus Disease, Alpha 1 Antitripsin deficiency, Anxiety masquerading as Airways dz,  ABPA,  Allergy(esp in young), Aspiration (esp in elderly), Adverse effects of meds,  Active smoking or Vaping, A bunch of PE's/clot burden (a few small clots can't cause this syndrome unless there is already severe underlying pulm or vascular dz with poor reserve),  Anemia or thyroid disorder, plus two Bs  = Bronchiectasis and Beta blocker use..and one C= CHF    Adherence is always the initial "prime suspect" and is a multilayered concern that requires a "trust but verify" approach in every patient - starting with knowing how to use medications, especially inhalers, correctly, keeping up with refills and understanding the fundamental difference between  maintenance and prns vs those medications only taken for a very short course and then stopped and not refilled.  - return with all meds in hand using a trust but verify approach to confirm accurate Medication  Reconciliation The principal here is that until we are certain that the  patients are doing what we've asked, it makes no sense to ask them to do more.   ACEi adverse effects at the  top of the usual list of suspects and the only way to rule it out is a trial off > see a/p    ? A bunch of PE's > unlikely on coumadin.  ? BB effects >  In the setting of respiratory symptoms of unknown etiology,  It would be preferable to use bystolic, the most beta -1  selective Beta blocker available in sample form, with bisoprolol the most selective generic choice  on the market, at least on a trial basis, to make sure the spillover Beta 2 effects of the less specific Beta blockers are not contributing to this patient's symptoms.   ? Chf> echo as above looks good p AVR

## 2019-04-19 NOTE — Patient Instructions (Addendum)
Try off lisinopril for now   Start Micardis 80 mg one daily x 4 weeks   .Make sure you check your oxygen saturations at highest level of activity to be sure it stays over 90% and adjust upward to maintain this level if needed but remember to turn it back to previous settings when you stop (to conserve your supply).   Please schedule a follow up office visit in 4 weeks, sooner if needed

## 2019-04-19 NOTE — Progress Notes (Addendum)
Dana Pruitt, female    DOB: 05-Nov-1940     MRN: [id number]   Brief patient profile:   47 yowf quit smoking 1992 previously followed by Dr Dana Pruitt with  GOLD II copd :   PFT 01/04/17: FVC 2.17 L (79%) FEV1 1.32 L (64%) FEV1/FVC 0.61 FEF 25-75 0.57 L (34%) negative bronchodilator response TLC 5.23 L (105%) RV 136% ERV 39% DLCO corrected 43% 12/30/12: FVC 2.77 L (101%) FEV1 1.92 L (89%) FEV1/FVC 0.69 FEF 25-75 1.13 L (51%) negative bronchodilator response TLC 4.77 L (97%) RV 110% DLCO uncorrected 51%  6MWT 03/08/17:  Walked 414 meters / Baseline Sat 96% on RA / Nadir Sat 94% on RA (stopped with 4:37 due to poor pulse ox signal)    CARDIAC TTE (07/14/16):  LV normal in size with EF 60-65% & no regional wall motion abnormalities. Grade 1 diastolic dysfunction. LA & RA normal in size. RV mildly dilated with reduced systolic function. Moderate aortic regurgitation without stenosis. Ascending aorta is mildly dilated with aortic root normal in size. No mitral stenosis or regurgitation. Mild pulmonic regurgitation. No significant tricuspid regurgitation. No pericardial effusion.    Admit date: 01/31/2019 Discharge date: 02/10/2019  Admission Diagnoses: Severe aortic insufficiency  Discharge Diagnoses:  Active Problems:   Aortic valve disease    hx The patient is a 78 year old female referred to Dr. Harl Pruitt right for evaluation of severe aortic insufficiency.  She has been seen by her cardiologist for several years with serial echocardiogram.  Her most recent echo revealed severe AI with some LV dilatation and reduced LV systolic function.  She underwent right and left heart catheterization and was found to have no significant coronary artery disease.  Right heart catheterization showed normal pressures with mixed venous saturations of 68%.  LVEDP was normal and cardiac index was 2.9.  Pulmonary function studies were performed due to a history of smoking heavy smoking in the past, although  really remote.  Her FEV1 was 1.2 and DLCO was 40%.  A CT scan showed the ascending aorta measuring 4.0 cm.  There was findings of panlobular emphysema noted on the CT scan.  Dr. Darcey Pruitt evaluated the patient and all her studies and recommended proceeding with elective aortic valve replacement and she was admitted this hospitalization for the procedure.   Hospital Course: The patient was admitted electively and on 01/31/2019 taken to the operating room where she underwent Aortic valve replacement with a 21 mm Inspiris Edwards valve..  She tolerated it well and was taken to the surgical intensive care unit in stable condition.  Postoperative hospital course:  The patient has done well postoperatively.  She has remained neurologically intact.  She was weaned from the ventilator using standard protocols without difficulty.  She initially required some inotropic support with dobutamine but this was able to be weaned without difficulty.  She was also started on Midodrine to assist with blood pressure.  She is not felt to be a candidate at this time for ACE inhibitor or ARB.  She does have some postoperative volume overload and is responding to diuretics.  She has had postoperative atrial fibrillation and has been started on an amiodarone drip per usual protocol.  She was transitioned to p.o. amiodarone and subsequently was chemically cardioverted to sinus rhythm with only occasional runs of afib. Metoprolol was uptitrated and additionally she was started on Coumadin.  At the time of discharge the patient was very stable.  Home health arrangements were also made to have INR  checked.  Additionally she did require home oxygen to be arranged with lower sats with ambulation in the qualifying range.  She has known severe COPD.      History of Present Illness  04/19/2019  Pulmonary/ 1st office eval/Dana Pruitt  Chief Complaint  Patient presents with   New Patient (Initial Visit)    Seen in the past by Dr  Dana Pruitt. She states she has been doing cardiac rehab at [facility] and having issues with DOE and low o2 sats.   Dyspnea:  Was walking in hallways prior to d/c with sats 87% so rec 2lpm sleeping and walking but did not  Use it that way and in rehab same problem so started on 2lpm but has been  weaned to 1pm  At home does dog walking up and down hills,  Finds talking and walking are tough/ has not noticed hoarseness  Cough: no Sleep: flat  SABA use: none  - has not helped.  No obvious day to day or daytime variability or assoc excess/ purulent sputum or mucus plugs or hemoptysis or cp or chest tightness, subjective wheeze or overt sinus or hb symptoms.   Sleeping  without nocturnal  or early am exacerbation  of respiratory  c/o's or need for noct saba. Also denies any obvious fluctuation of symptoms with weather or environmental changes or other aggravating or alleviating factors except as outlined above   No unusual exposure hx or h/o childhood pna/ asthma or knowledge of premature birth.  Current Allergies, Complete Past Medical History, Past Surgical History, Family History, and Social History were reviewed in [employer] record.  ROS  The following are not active complaints unless bolded Hoarseness, sore throat, dysphagia, dental problems, itching, sneezing,  nasal congestion or discharge of excess mucus or purulent secretions, ear ache,   fever, chills, sweats, unintended wt loss or wt gain, classically pleuritic or exertional cp,  orthopnea pnd or arm/hand swelling  or leg swelling, presyncope, palpitations, abdominal pain, anorexia, nausea, vomiting, diarrhea  or change in bowel habits or change in bladder habits, change in stools or change in urine, dysuria, hematuria,  rash, arthralgias, visual complaints, headache, numbness, weakness or ataxia or problems with walking or coordination,  change in mood or  memory.           Past Medical History:  Diagnosis Date   Anemia     "several times over the years" (10/17/2012)   Anxiety    Breast cancer ([place]) 02/2010   right breast   Carcinoma of breast treated with adjuvant hormone therapy ([place]) 9/11   Femara   CHF (congestive heart failure) ([facility])    COPD (chronic obstructive pulmonary disease) (H[facility])    Depression    Exertional shortness of breath    "recently" (10/17/2012)   GERD (gastroesophageal reflux disease)    diet controlled, no med   Hollenhorst plaque, left eye    apparently resolved at recent ophthalmology visit   Hyperlipidemia    Hypertension    IDC, RightStage II, 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     atorvastatin (LIPITOR) 20 MG tablet Take 20 mg by mouth daily.      carvedilol (COREG) 12.5 MG tablet Take 1 tablet (12.5 mg  total) by mouth 2 (two) times daily with a meal. 180 tablet 1   cholecalciferol (VITAMIN D3) 25 MCG (1000 UT) tablet Take 1,000 Units by mouth daily.     denosumab (PROLIA) 60 MG/ML SOSY injection Inject 60 mg into the skin every 6 (six) months.     guaiFENesin (MUCINEX) 600 MG 12 hr tablet Take 1 tablet (600 mg total) by mouth 2 (two) times daily. 30 tablet 0   lisinopril (ZESTRIL) 40 MG tablet Take 1 tablet (40 mg total) by mouth daily. 30 tablet 3   sertraline (ZOLOFT) 100 MG tablet Take 100 mg by mouth 2 (two) times a day.      vitamin B-12 (CYANOCOBALAMIN) 500 MCG tablet Take 500 mcg by mouth daily.     warfarin (COUMADIN) 2 MG tablet Take 1.5 tablets (3 mg total) by mouth daily at 6 PM. AS DIRECTED BY THE COUMADIN CLINIC 100 tablet 1   amoxicillin (AMOXIL) 500 MG tablet TAKE 4 TABLETS 1 HOUR BEFORE DENTAL EXAMINATION (Patient not taking: Reported on 04/19/2019) 4 tablet 0      Objective:     BP 104/62 (BP  Location: Left Arm, Cuff Size: Normal)    Pulse 79    Temp (!) 97.3 F (36.3 C) (Temporal)    Ht 5\' 4"  (1.626 m)    Wt 121 lb (54.9 kg)    LMP  (LMP Unknown)    SpO2 96% Comment: on RA   BMI 20.77 kg/m     amb wf classic fatigue   HEENT : pt wearing mask not removed for exam due to covid - 19 concerns.   NECK :  without JVD/Nodes/TM/ nl carotid upstrokes bilaterally   LUNGS: no acc muscle use,  Min barrel  contour chest wall with bilateral  slightly decreased bs s audible wheeze and  without cough on insp or exp maneuvers and min  Hyperresonant  to  percussion bilaterally     CV:  RRR  no s3 or murmur or increase in P2, and no edema   ABD:  soft and nontender with pos end  insp Hoover's  in the supine position. No bruits or organomegaly appreciated, bowel sounds nl  MS:   Nl gait/  ext warm without deformities, calf tenderness, cyanosis or clubbing No obvious joint restrictions   SKIN: warm and dry without lesions    NEURO:  alert, approp, nl sensorium with  no motor or cerebellar deficits apparent/ ? slt choreoathetoid head movements    CXR PA and Lateral:   04/19/2019 :    I personally reviewed images and agree with radiology impression as follows:    No acute cardiopulmonary disease. Complete resolution of the pleural effusions seen on the prior study. No hiatal hernia. Aortic atherosclerosis.             Assessment   COPD  GOLD II Quit smoking 1992  01/04/17: FVC 2.17 L (79%) FEV1 1.32 L (64%) FEV1/FVC 0.61 FEF 25-75 0.57 L (34%) negative bronchodilator response TLC 5.23 L (105%) RV 136% ERV 39% DLCO corrected 43% - PFT's  01/05/2019  FEV1 1.30 (64 % ) ratio 0.62  p 6 % improvement from saba p ? prior to study with DLCO  7.27 (39%) corrects to 2.15 (52%)  for alv volume and FV curve classic concave pattern   Although she has copd with low dlco, I agee with Dr Dana Pruitt that more aggressive rx for copd not necessarily needed here as some of her symptoms strongly suggest  uacs mimicking copd (worse sob walking if talking/ classic voice fatigue while on ACEi) and will focus on that first then add trial off lama/laba at next ov (stiolto being the least to affect the upper airway would be a good choice)       DOE (dyspnea on exertion) S/p   Aortic valve replacement with a 21 mm Inspiris Edwards valve and desats with activity post op. Echo 01/31/19 post op POST-OP IMPRESSIONS - Left Ventricle: has mildly reduced systolic function, with an ejection fraction of 35-40%. - Aorta: The aorta appears unchanged from pre-bypass. - Left Atrial Appendage: The left atrial appendage appears unchanged from pre-bypass. - Aortic Valve: A bioprosthetic valve was placed, leaflets thin. No regurgitation post repair. The gradient recorded across the prosthetic valve is within the expected range. No perivalvular leak noted. - Mitral Valve: The mitral valve appears unchanged from pre-bypass. - Tricuspid Valve: The tricuspid valve appears unchanged from pre-bypass. - Interatrial Septum: The interatrial septum appears unchanged from pre-bypass. - Pericardium: The pericardium appears unchanged from pre-bypass   Symptoms are  disproportionate to objective findings and not clear to what extent this is actually a pulmonary  problem but pt does appear to have difficult to sort out respiratory symptoms of unknown origin for which  DDX  = almost all start with A and  include Adherence, Ace Inhibitors, Acid Reflux, Active Sinus Disease, Alpha 1 Antitripsin deficiency, Anxiety masquerading as Airways dz,  ABPA,  Allergy(esp in young), Aspiration (esp in elderly), Adverse effects of meds,  Active smoking or Vaping, A bunch of PE's/clot burden (a few small clots can't cause this syndrome unless there is already severe underlying pulm or vascular dz with poor reserve),  Anemia or thyroid disorder, plus two Bs  = Bronchiectasis and Beta blocker use..and one C= CHF    Adherence is always the initial  "prime suspect" and is a multilayered concern that requires a "trust but verify" approach in every patient - starting with knowing how to use medications, especially inhalers, correctly, keeping up with refills and understanding the fundamental difference between maintenance and prns vs those medications only taken for a very short course and then stopped and not refilled.  - return with all meds in hand using a trust but verify approach to confirm accurate Medication  Reconciliation The principal here is that until we are certain that the  patients are doing what we've asked, it makes no sense to ask them to do more.   ACEi adverse effects at the  top of the usual list of suspects and the only way to rule it out is a trial off > see a/p    ? A bunch of PE's > unlikely on coumadin.  ? BB effects >  In the setting of respiratory symptoms of unknown etiology,  It would be preferable to use bystolic, the most beta -1  selective Beta blocker available in sample form, with bisoprolol the most selective generic choice  on the market, at least on a trial basis, to make sure the spillover Beta 2 effects of the less specific Beta blockers are not contributing to this patient's symptoms.   ? Chf> echo as above looks good p AVR       Essential hypertension Try off acei 04/19/2019 for hoarseness, doe    In the best review of chronic cough to date ( NEJM 2016 375 1544-1551) ,  ACEi are now felt to cause cough in up to  20% of pts which is  a 4 fold increase from previous reports and does not include the variety of non-specific complaints we see in pulmonary clinic in pts on ACEi but previously attributed to another dx like  Copd/asthma and  include PNDS, throat and chest congestion, "bronchitis", unexplained dyspnea and noct "strangling" sensations, and hoarseness, but also  atypical /refractory GERD symptoms like dysphagia and "bad heartburn"   The only way I know  to prove this is not an "ACEi Case" is a  trial off ACEi x a minimum of 4 weeks then regroup.   >>> try micardis 80 mg daily x 4 weeks then ov   Chronic respiratory failure with hypoxia ([place]) D/c 01/31/2019  on 02 p AVR   Likely multifactorial with low dlco suggestive of component of emphysema or ILD   As of 04/19/2019 rx 2lpm hs and titrate with activity to keep sats > 90%    Total time devoted to counseling  > 50 % of initial 60 min office visit:  review case with pt/ discussion of options/alternatives/ personally creating written customized instructions  in presence of pt  then going over those specific  Instructions directly with the pt including how to use all of the meds but in particular covering each new medication in detail and the difference between the maintenance= "automatic" meds and the prns using an action plan format for the latter (If this problem/symptom => do that organization reading Left to right).  Please see AVS from this visit for a full list of these instructions which I personally wrote for this pt and  are unique to this visit.      Christinia Gully, MD 04/19/2019

## 2019-04-21 ENCOUNTER — Encounter (HOSPITAL_COMMUNITY)
Admission: RE | Admit: 2019-04-21 | Discharge: 2019-04-21 | Disposition: A | Discharge status: Home / Self Care | Payer: Medicare Other | Source: Ambulatory Visit | Attending: Cardiovascular Disease | Admitting: Cardiovascular Disease

## 2019-04-21 ENCOUNTER — Other Ambulatory Visit: Payer: Self-pay

## 2019-04-21 DIAGNOSIS — Z952 Presence of prosthetic heart valve: Secondary | ICD-10-CM

## 2019-04-21 NOTE — Progress Notes (Signed)
I have reviewed a Home Exercise Prescription with [name] . [name] is currently exercising at home.  The patient was advised to walk 5-7 days a week for 30-45 minutes.  [name] and I discussed how to progress their exercise prescription.  The patient stated that their goals were to continue walking at home 2-4 days per week for 30-45 minutes in addition to CR program.  The patient stated that they understand the exercise prescription.  We reviewed exercise guidelines, target heart rate during exercise, RPE Scale, weather conditions, NTG use, endpoints for exercise, warmup and cool down.  Patient is encouraged to come to me with any questions. I will continue to follow up with the patient to assist them with progression and safety.     Deitra Mayo BS, ACSM CEP 04/21/2019 3:45 PM

## 2019-04-24 ENCOUNTER — Encounter (HOSPITAL_COMMUNITY)
Admission: RE | Admit: 2019-04-24 | Discharge: 2019-04-24 | Disposition: A | Discharge status: Home / Self Care | Payer: Medicare Other | Source: Ambulatory Visit | Attending: Cardiovascular Disease | Admitting: Cardiovascular Disease

## 2019-04-24 ENCOUNTER — Other Ambulatory Visit: Payer: Self-pay

## 2019-04-24 DIAGNOSIS — Z952 Presence of prosthetic heart valve: Secondary | ICD-10-CM | POA: Insufficient documentation

## 2019-04-25 ENCOUNTER — Ambulatory Visit (INDEPENDENT_AMBULATORY_CARE_PROVIDER_SITE_OTHER): Payer: Medicare Other | Admitting: Pharmacist Clinician (PhC)/ Clinical Pharmacy Specialist

## 2019-04-25 ENCOUNTER — Ambulatory Visit: Payer: Medicare Other | Admitting: Cardiovascular Disease

## 2019-04-25 ENCOUNTER — Encounter: Payer: Self-pay | Admitting: Cardiovascular Disease

## 2019-04-25 VITALS — BP 104/65 | HR 79 | Temp 97.0°F | Ht 64.0 in | Wt 122.2 lb

## 2019-04-25 DIAGNOSIS — I359 Nonrheumatic aortic valve disorder, unspecified: Secondary | ICD-10-CM

## 2019-04-25 DIAGNOSIS — I351 Nonrheumatic aortic (valve) insufficiency: Secondary | ICD-10-CM | POA: Diagnosis not present

## 2019-04-25 DIAGNOSIS — I4891 Unspecified atrial fibrillation: Secondary | ICD-10-CM

## 2019-04-25 DIAGNOSIS — Z7901 Long term (current) use of anticoagulants: Secondary | ICD-10-CM

## 2019-04-25 DIAGNOSIS — I10 Essential (primary) hypertension: Secondary | ICD-10-CM

## 2019-04-25 LAB — POCT INR: INR: 1.9 — AB (ref 2.0–3.0)

## 2019-04-25 NOTE — Progress Notes (Signed)
04/25/2019 Dana Pruitt   1941/04/07  [id number]  Primary Physician Deland Pretty, MD Primary Cardiologist: Lorretta Harp MD [name], [place]  HPI:  Dana Pruitt is a 78 y.o.  thin-appearing married Caucasian female mother of 2, grandmother of 2 grandchildren whose mother Dana Pruitt was also a patient of mine and died at the age of 74. She was referred by Dr. Deland Pretty for cardiovascular evaluation because of echocardiogram performed 02/16/14 that showed moderate left ventricular dysfunction with an ejection fraction of 35-40% and moderate aortic insufficiency.I last saw for a virtual telemedicine phone visit  10/11/2018. Her cardiovascular risk factor profile is remarkable for remote tobacco abuse having quit in 1990 and treated hyperlipidemia. There is no family history of heart disease. She has never had a heart attack or stroke and denies chest pain or shortness of breath. She did have a Hollenhorst plaque documented by ophthalmologic exam several months ago. Carotid Dopplers performed since that time were normal. The plaque is since resolved. A 2-D echo was performed that showed an EF of 35-40% with moderate aortic insufficiency. LV size is normal and there was no LVH. She did have breast cancer documented by lumpectomy in 2013 and had chemotherapy and radiation therapy. Her oncologist was Dr. Letta Pate . She is on Femara . I began her on low-dose carvedilol and repeated her 2-D echocardiogram on 08/10/14 revealed improvement in her LV function up to 45-50%. She continues to have moderate to severe AI with normal LV size and function.. She only complains of mild dyspnea on exertion. Since I saw her year ago she should make remained clinically stable. She denies chest pain or shortness of breath  The   Most recent echo performed 10/04/2018 revealed EF of 40 to 45% with normal LV size with severe aortic insufficiency.  She continues to deny symptoms of shortness of breath.   She was admitted in July with chest pain and shortness of breath and underwent right left heart cath by Dr. Aundra Dubin revealing minimal CAD with severe AI.  She ultimately underwent bioprosthetic AVR by Dr. [name] and a follow-up admission 01/31/2019 with a 21 mm Inspira's Edwards bioprosthetic valve.  She was hospitalized for 10 days.  She continues to recuperate.  She denies chest pain or shortness of breath.  She remains on Coumadin anticoagulation because of preop A. fib which converted prior to discharge on amiodarone which has since been discontinued.  She is participating cardiac rehab.  She was on 1 L of O2 with exercise and at night which she was not on prior to admission I think can probably be discontinued.   Current Meds  Medication Sig  . amoxicillin (AMOXIL) 500 MG tablet TAKE 4 TABLETS 1 HOUR BEFORE DENTAL EXAMINATION (Patient taking differently: as needed. TAKE 4 TABLETS 1 HOUR BEFORE DENTAL EXAMINATION)  . aspirin EC 81 MG tablet Take 81 mg by mouth daily.  [name] atorvastatin (LIPITOR) 20 MG tablet Take 20 mg by mouth daily.   . carvedilol (COREG) 12.5 MG tablet Take 1 tablet (12.5 mg total) by mouth 2 (two) times daily with a meal.  . cholecalciferol (VITAMIN D3) 25 MCG (1000 UT) tablet Take 1,000 Units by mouth daily.  [name] denosumab (PROLIA) 60 MG/ML SOSY injection Inject 60 mg into the skin every 6 (six) months.  [name] guaiFENesin (MUCINEX) 600 MG 12 hr tablet Take 1 tablet (600 mg total) by mouth 2 (two) times daily.  . sertraline (ZOLOFT) 100 MG tablet Take  100 mg by mouth 2 (two) times a day.   . telmisartan (MICARDIS) 80 MG tablet Take 1 tablet (80 mg total) by mouth daily.  . vitamin B-12 (CYANOCOBALAMIN) 500 MCG tablet Take 500 mcg by mouth daily.  [name] warfarin (COUMADIN) 2 MG tablet Take 1.5 tablets (3 mg total) by mouth daily at 6 PM. AS DIRECTED BY THE COUMADIN CLINIC     No Known Allergies  Social History   Socioeconomic History  . Marital status: Married    Spouse name: Not  on file  . Number of children: 2  . Years of education: college  . Highest education level: Not on file  Occupational History    Employer: RETIRED  Social Needs  . Financial resource strain: Not on file  . Food insecurity    Worry: Not on file    Inability: Not on file  . Transportation needs    Medical: Not on file    Non-medical: Not on file  Tobacco Use  . Smoking status: Former Smoker    Packs/day: 3.00    Years: 30.00    Pack years: 90.00    Types: Cigarettes    Start date: 05/19/1958    Quit date: 06/30/1990    Years since quitting: 28.8  . Smokeless tobacco: Never Used  . Tobacco comment: Stopped smoking for 2-3 years during second child.   Substance and Sexual Activity  . Alcohol use: Yes    Comment: 1-2 glasses wine/month  . Drug use: No  . Sexual activity: Yes    Birth control/protection: Post-menopausal    Comment: Menarche age 89, G60,P2, Menopause in 50"s, No HRT Married  Lifestyle  . Physical activity    Days per week: Not on file    Minutes per session: Not on file  . Stress: Not on file  Relationships  . Social Herbalist on phone: Not on file    Gets together: Not on file    Attends religious service: Not on file    Active member of club or organization: Not on file    Attends meetings of clubs or organizations: Not on file    Relationship status: Not on file  . Intimate partner violence    Fear of current or ex partner: Not on file    Emotionally abused: Not on file    Physically abused: Not on file    Forced sexual activity: Not on file  Other Topics Concern  . Not on file  Social History Narrative   [facility] (12/29/16):   Originally from [place], [place]. Moved to [place] at 78 y.o. Does have a dog. Hasn't worked outside the home. Remote parakeet exposure from her children. No mold or hot tub exposure. Enjoys doing yard work & walking.      Review of Systems: General: negative for chills, fever, night sweats or weight changes.   Cardiovascular: negative for chest pain, dyspnea on exertion, edema, orthopnea, palpitations, paroxysmal nocturnal dyspnea or shortness of breath Dermatological: negative for rash Respiratory: negative for cough or wheezing Urologic: negative for hematuria Abdominal: negative for nausea, vomiting, diarrhea, bright red blood per rectum, melena, or hematemesis Neurologic: negative for visual changes, syncope, or dizziness All other systems reviewed and are otherwise negative except as noted above.    Blood pressure 104/65, pulse 79, temperature (!) 97 F (36.1 C), height 5\' 4"  (1.626 m), weight 122 lb 3.2 oz (55.4 kg), SpO2 90 %.  General appearance: alert and no distress Neck: no  adenopathy, no carotid bruit, no JVD, supple, symmetrical, trachea midline and thyroid not enlarged, symmetric, no tenderness/mass/nodules Lungs: clear to auscultation bilaterally Heart: regular rate and rhythm, S1, S2 normal, no murmur, click, rub or gallop Extremities: extremities normal, atraumatic, no cyanosis or edema Pulses: 2+ and symmetric Skin: Skin color, texture, turgor normal. No rashes or lesions Neurologic: Alert and oriented X 3, normal strength and tone. Normal symmetric reflexes. Normal coordination and gait  EKG not performed today  ASSESSMENT AND PLAN:   HLD (hyperlipidemia) History of hyperlipidemia on statin therapy with lipid profile performed 01/12/2019 revealing total cholesterol 131, LDL 70 and HDL 40.  Left ventricular dysfunction History of moderate left ventricular dysfunction with an EF in the 35 to 40% range thought to be nonischemic less likely related to aortic insufficiency.  She is on appropriate medications including carvedilol and Micardis.  Severe aortic regurgitation History of severe aortic insufficiency status post bioprosthetic AVR by Dr. [name] 01/31/2019 with a 21 mm Inspiris Edwards bioprosthetic valve.  This was done after right left heart cath performed by Dr.  Aundra Dubin on 01/08/2019 revealed minimal CAD.  She was hospitalized from 01/31/2019 through 02/10/2019 and continues to recuperate.  Atrial fibrillation ([place]) History of perioperative A. fib converting to sinus rhythm discharged on amiodarone and Coumadin.  She is no longer on amiodarone but remains on Coumadin anticoagulation which I think can be discontinued.  She is scheduled to see Dr. [name] tomorrow.  Her last EKG performed 02/24/2019 revealed sinus rhythm.  Essential hypertension History of essential hypertension with blood pressure measured today 104/65.  She is on carvedilol and Micardis.      Lorretta Harp MD FACP,FACC,FAHA, [facility] 04/25/2019 10:55 AM

## 2019-04-25 NOTE — Assessment & Plan Note (Signed)
History of essential hypertension with blood pressure measured today 104/65.  She is on carvedilol and Micardis.

## 2019-04-25 NOTE — Assessment & Plan Note (Signed)
History of hyperlipidemia on statin therapy with lipid profile performed 01/12/2019 revealing total cholesterol 131, LDL 70 and HDL 40.

## 2019-04-25 NOTE — Assessment & Plan Note (Signed)
History of perioperative A. fib converting to sinus rhythm discharged on amiodarone and Coumadin.  She is no longer on amiodarone but remains on Coumadin anticoagulation which I think can be discontinued.  She is scheduled to see Dr. [name] tomorrow.  Her last EKG performed 02/24/2019 revealed sinus rhythm.

## 2019-04-25 NOTE — Patient Instructions (Signed)
Medication Instructions:  Your physician recommends that you continue on your current medications as directed. Please refer to the Current Medication list given to you today.  If you need a refill on your cardiac medications before your next appointment, please call your pharmacy.   Lab work: NONE  Testing/Procedures: Your physician has requested that you have an echocardiogram. Echocardiography is a painless test that uses sound waves to create images of your heart. It provides your doctor with information about the size and shape of your heart and how well your heart's chambers and valves are working. This procedure takes approximately one hour. There are no restrictions for this procedure. [place] [address]  Follow-Up: At [facility], you and your health needs are our priority.  As part of our continuing mission to provide you with exceptional heart care, we have created designated Provider Care Teams.  These Care Teams include your primary Cardiologist (physician) and Advanced Practice Providers (APPs -  Physician Assistants and Nurse Practitioners) who all work together to provide you with the care you need, when you need it. You may see Quay Burow, MD or one of the following Advanced Practice Providers on your designated Care Team:    Kerin Ransom, PA-C  [place], [place]  Coletta Memos, [place]   Your physician wants you to follow-up in: 6 months with Almyra Deforest, PA and Dr [name] in 1 year. You will receive a reminder letter in the mail two months in advance. If you don't receive a letter, please call our office to schedule the follow-up appointment.

## 2019-04-25 NOTE — Assessment & Plan Note (Signed)
History of moderate left ventricular dysfunction with an EF in the 35 to 40% range thought to be nonischemic less likely related to aortic insufficiency.  She is on appropriate medications including carvedilol and Micardis.

## 2019-04-25 NOTE — Patient Instructions (Signed)
Increase warfarin dose to 3mg  daily except for 2mg  each Monday and Friday. Repeat INR in 2 weeks.

## 2019-04-25 NOTE — Assessment & Plan Note (Signed)
History of severe aortic insufficiency status post bioprosthetic AVR by Dr. [name] 01/31/2019 with a 21 mm Inspiris Edwards bioprosthetic valve.  This was done after right left heart cath performed by Dr. Aundra Dubin on 01/08/2019 revealed minimal CAD.  She was hospitalized from 01/31/2019 through 02/10/2019 and continues to recuperate.

## 2019-04-26 ENCOUNTER — Encounter (HOSPITAL_COMMUNITY)
Admission: RE | Admit: 2019-04-26 | Discharge: 2019-04-26 | Disposition: A | Discharge status: Home / Self Care | Payer: Medicare Other | Source: Ambulatory Visit | Attending: Cardiovascular Disease | Admitting: Cardiovascular Disease

## 2019-04-26 ENCOUNTER — Ambulatory Visit (INDEPENDENT_AMBULATORY_CARE_PROVIDER_SITE_OTHER): Payer: Self-pay | Admitting: Cardiothoracic Surgery

## 2019-04-26 ENCOUNTER — Encounter: Payer: Self-pay | Admitting: Cardiothoracic Surgery

## 2019-04-26 ENCOUNTER — Other Ambulatory Visit: Payer: Self-pay

## 2019-04-26 DIAGNOSIS — Z952 Presence of prosthetic heart valve: Secondary | ICD-10-CM | POA: Insufficient documentation

## 2019-04-26 HISTORY — DX: Presence of prosthetic heart valve: Z95.2

## 2019-04-26 NOTE — Progress Notes (Signed)
PCP is Deland Pretty, MD Referring Provider is Dorothy Spark, MD  Chief Complaint  Patient presents with  . Routine Post Op    s/p AVR 01/31/19    HPI: 3 months follow-up after AVR for severe aortic insufficiency with a 21 mm bioprosthetic valve.  She is maintained sinus rhythm for several weeks, amiodarone has been discontinued and the Coumadin will be stopped now.  She is gaining strength and incisions are all well-healed.  She is still requiring home oxygen for exertional activities where her room air saturation drops to 85%.  She does not need to wear oxygen at night now that she is 3 months after surgery and her pulmonary mechanics and chest wall compliance have recovered.  She is doing well with cardiac rehab. No problems with shortness of breath chest pain or ankle edema  Past Medical History:  Diagnosis Date  . Anemia    "several times over the years" (10/17/2012)  . Anxiety   . Breast cancer ([place]) 02/2010   right breast  . Carcinoma of breast treated with adjuvant hormone therapy ([place]) 9/11   Femara  . CHF (congestive heart failure) ([place])   . COPD (chronic obstructive pulmonary disease) ([place])   . Depression   . Exertional shortness of breath    "recently" (10/17/2012)  . GERD (gastroesophageal reflux disease)    diet controlled, no med  . Hollenhorst plaque, left eye    apparently resolved at recent ophthalmology visit  . Hyperlipidemia   . Hypertension   . 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 Left ventricular dysfunction   . Moderate aortic insufficiency   . Pneumonia    "that's why I'm here" (10/17/2012)    Past Surgical History:  Procedure Laterality Date  . AORTIC VALVE REPLACEMENT N/A 01/31/2019   Procedure: AORTIC VALVE REPLACEMENT (AVR) USING 21 MM INSPIRIS RESILIS AORTIC VALVE. SN: [id number];  Surgeon: Prescott Gum, Collier Salina, MD;  Location: [facility];  Service: Open Heart Surgery;  Laterality: N/A;  . APPENDECTOMY  1974  . BREAST BIOPSY Right 02/27/10   Needle core Biopsy; Invasive Mammary; ER/PR Positive, Her-2 Neu negative, Ki-67 22%  . BREAST LUMPECTOMY WITH SENTINEL LYMPH NODE BIOPSY Right 07/03/11   Invasive Ductal Carcinoma;0/3 nodes negative,; ER,PR Positive, Her-2 Neg; Ki-67 22%  . [place]; 1974  . COLONOSCOPY    . DILATION AND CURETTAGE OF UTERUS  1970's  . RIGHT/LEFT HEART CATH AND CORONARY ANGIOGRAPHY N/A 01/02/2019   Procedure: RIGHT/LEFT HEART CATH AND CORONARY ANGIOGRAPHY;  Surgeon: Larey Dresser, MD;  Location: [facility];  Service: Cardiovascular;  Laterality: N/A;  . TEE WITHOUT CARDIOVERSION N/A 01/31/2019   Procedure: TRANSESOPHAGEAL ECHOCARDIOGRAM (TEE);  Surgeon: Prescott Gum, Collier Salina, MD;  Location: [facility];  Service: Open Heart Surgery;  Laterality: N/A;  . TUBAL LIGATION  1974  . WISDOM TOOTH EXTRACTION      Family History  Problem Relation Age of Onset  . Heart disease Mother   . Cancer Father   . Heart disease Maternal Grandmother   . Heart disease Maternal Grandfather   . Cancer Paternal Grandmother   . Lung disease Neg Hx     Social History Social History   Tobacco Use  . Smoking status: Former Smoker    Packs/day: 3.00    Years: 30.00    Pack years: 90.00    Types: Cigarettes    Start date: 05/19/1958    Quit date: 06/30/1990    Years since quitting: 28.8  .  Smokeless tobacco: Never Used  . Tobacco comment: Stopped smoking for 2-3 years during second child.   Substance Use Topics  . Alcohol use: Yes    Comment: 1-2 glasses wine/month  . Drug use: No    Current Outpatient Medications  Medication Sig Dispense Refill  . amoxicillin (AMOXIL) 500 MG tablet TAKE 4 TABLETS 1 HOUR BEFORE DENTAL EXAMINATION (Patient taking differently: as needed. TAKE 4 TABLETS 1 HOUR BEFORE DENTAL EXAMINATION) 4 tablet 0  . aspirin EC 81 MG tablet Take 81 mg by mouth daily.    [name] atorvastatin (LIPITOR) 20 MG tablet Take 20 mg by mouth daily.     . carvedilol (COREG) 12.5 MG tablet Take 1 tablet (12.5  mg total) by mouth 2 (two) times daily with a meal. 180 tablet 1  . cholecalciferol (VITAMIN D3) 25 MCG (1000 UT) tablet Take 1,000 Units by mouth daily.    [name] denosumab (PROLIA) 60 MG/ML SOSY injection Inject 60 mg into the skin every 6 (six) months.    [name] guaiFENesin (MUCINEX) 600 MG 12 hr tablet Take 1 tablet (600 mg total) by mouth 2 (two) times daily. 30 tablet 0  . sertraline (ZOLOFT) 100 MG tablet Take 100 mg by mouth 2 (two) times a day.     . telmisartan (MICARDIS) 80 MG tablet Take 1 tablet (80 mg total) by mouth daily. 30 tablet 11  . vitamin B-12 (CYANOCOBALAMIN) 500 MCG tablet Take 500 mcg by mouth daily.    [name] warfarin (COUMADIN) 2 MG tablet Take 1.5 tablets (3 mg total) by mouth daily at 6 PM. AS DIRECTED BY THE COUMADIN CLINIC 100 tablet 1   No current facility-administered medications for this visit.    Facility-Administered Medications Ordered in Other Visits  Medication Dose Route Frequency Provider Last Rate Last Dose  . sodium chloride flush (NS) 0.9 % injection 3 mL  3 mL Intravenous Q12H Larey Dresser, MD        No Known Allergies  Review of Systems  No new problems  BP 100/65 (BP Location: Left Arm)   Pulse 72   Temp (!) 97.5 F (36.4 C) (Skin)   Resp 16   Ht _0  (1.626 m)   Wt 122 lb 9.6 oz (55.6 kg)   LMP  (LMP Unknown)   SpO2 92% Comment: RA  BMI 21.04 kg/m  Physical Exam      Exam    General- alert and comfortable    Neck- no JVD, no cervical adenopathy palpable, no carotid bruit   Lungs- clear without rales, wheezes   Cor- regular rate and rhythm, no murmur , gallop   Abdomen- soft, non-tender   Extremities - warm, non-tender, minimal edema   Neuro- oriented, appropriate, no focal weakness   Diagnostic Tests: Last chest x-ray reviewed.  Clear. Patient will have a echocardiogram in the next few weeks by Dr. [name] to follow-up recent AVR Impression: Doing well after AVR for severe AI COPD on home oxygen for exertional activities to  continue until next office visit scheduled in 8 weeks with chest x-ray. Continue oxygen with cardiac rehab activities. Plan: Return in 8 weeks with chest x-ray to assess progress. Continue aspirin 81 mg for the tissue AVR.  Len Childs, MD [facility] [phone]

## 2019-04-28 ENCOUNTER — Encounter (HOSPITAL_COMMUNITY)
Admission: RE | Admit: 2019-04-28 | Discharge: 2019-04-28 | Disposition: A | Discharge status: Home / Self Care | Payer: Medicare Other | Source: Ambulatory Visit | Attending: Cardiovascular Disease | Admitting: Cardiovascular Disease

## 2019-04-28 ENCOUNTER — Other Ambulatory Visit: Payer: Self-pay

## 2019-04-28 DIAGNOSIS — Z952 Presence of prosthetic heart valve: Secondary | ICD-10-CM | POA: Diagnosis not present

## 2019-05-01 ENCOUNTER — Other Ambulatory Visit: Payer: Self-pay

## 2019-05-01 ENCOUNTER — Encounter (HOSPITAL_COMMUNITY)
Admission: RE | Admit: 2019-05-01 | Discharge: 2019-05-01 | Disposition: A | Discharge status: Home / Self Care | Payer: Medicare Other | Source: Ambulatory Visit | Attending: Cardiovascular Disease | Admitting: Cardiovascular Disease

## 2019-05-01 DIAGNOSIS — Z952 Presence of prosthetic heart valve: Secondary | ICD-10-CM

## 2019-05-02 ENCOUNTER — Ambulatory Visit (HOSPITAL_COMMUNITY): Payer: Medicare Other | Attending: Internal Medicine

## 2019-05-02 DIAGNOSIS — I4891 Unspecified atrial fibrillation: Secondary | ICD-10-CM | POA: Insufficient documentation

## 2019-05-02 DIAGNOSIS — I359 Nonrheumatic aortic valve disorder, unspecified: Secondary | ICD-10-CM | POA: Diagnosis not present

## 2019-05-03 ENCOUNTER — Other Ambulatory Visit: Payer: Self-pay

## 2019-05-03 ENCOUNTER — Encounter (HOSPITAL_COMMUNITY)
Admission: RE | Admit: 2019-05-03 | Discharge: 2019-05-03 | Disposition: A | Discharge status: Home / Self Care | Payer: Medicare Other | Source: Ambulatory Visit | Attending: Cardiovascular Disease | Admitting: Cardiovascular Disease

## 2019-05-03 DIAGNOSIS — Z952 Presence of prosthetic heart valve: Secondary | ICD-10-CM

## 2019-05-04 NOTE — Progress Notes (Signed)
Cardiac Individual Treatment Plan  Patient Details  Name: Dana Pruitt MRN: [id number] Date of Birth: 16-Feb-1941 Referring Provider:     CARDIAC REHAB PHASE II ORIENTATION from 03/30/2019 in [place]  Referring Provider  Dr. [name]      Initial Encounter Date:    CARDIAC REHAB PHASE II ORIENTATION from 03/30/2019 in [place]  Date  03/30/19      Visit Diagnosis: S/P AVR (aortic valve replacement)  Patient's Home Medications on Admission:  Current Outpatient Medications:  .  amoxicillin (AMOXIL) 500 MG tablet, TAKE 4 TABLETS 1 HOUR BEFORE DENTAL EXAMINATION (Patient taking differently: as needed. TAKE 4 TABLETS 1 HOUR BEFORE DENTAL EXAMINATION), Disp: 4 tablet, Rfl: 0 .  aspirin EC 81 MG tablet, Take 81 mg by mouth daily., Disp: , Rfl:  .  atorvastatin (LIPITOR) 20 MG tablet, Take 20 mg by mouth daily. , Disp: , Rfl:  .  carvedilol (COREG) 12.5 MG tablet, Take 1 tablet (12.5 mg total) by mouth 2 (two) times daily with a meal., Disp: 180 tablet, Rfl: 1 .  cholecalciferol (VITAMIN D3) 25 MCG (1000 UT) tablet, Take 1,000 Units by mouth daily., Disp: , Rfl:  .  denosumab (PROLIA) 60 MG/ML SOSY injection, Inject 60 mg into the skin every 6 (six) months., Disp: , Rfl:  .  guaiFENesin (MUCINEX) 600 MG 12 hr tablet, Take 1 tablet (600 mg total) by mouth 2 (two) times daily., Disp: 30 tablet, Rfl: 0 .  sertraline (ZOLOFT) 100 MG tablet, Take 100 mg by mouth 2 (two) times a day. , Disp: , Rfl:  .  telmisartan (MICARDIS) 80 MG tablet, Take 1 tablet (80 mg total) by mouth daily., Disp: 30 tablet, Rfl: 11 .  vitamin B-12 (CYANOCOBALAMIN) 500 MCG tablet, Take 500 mcg by mouth daily., Disp: , Rfl:  .  warfarin (COUMADIN) 2 MG tablet, Take 1.5 tablets (3 mg total) by mouth daily at 6 PM. AS DIRECTED BY THE [facility], Disp: 100 tablet, Rfl: 1 No current facility-administered medications for this encounter.    Facility-Administered Medications Ordered in Other Encounters:  .  sodium chloride flush (NS) 0.9 % injection 3 mL, 3 mL, Intravenous, Q12H, Larey Dresser, MD  Past Medical History: Past Medical History:  Diagnosis Date  . Anemia    "several times over the years" (10/17/2012)  . Anxiety   . Breast cancer ([place]) 02/2010   right breast  . Carcinoma of breast treated with adjuvant hormone therapy ([place]) 9/11   [facility]  . CHF (congestive heart failure) ([place])   . COPD (chronic obstructive pulmonary disease) ([place])   . Depression   . Exertional shortness of breath    "recently" (10/17/2012)  . GERD (gastroesophageal reflux disease)    diet controlled, no med  . Hollenhorst plaque, left eye    apparently resolved at recent ophthalmology visit  . Hyperlipidemia   . Hypertension   . 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 Left ventricular dysfunction   . Moderate aortic insufficiency   . Pneumonia    "that's why I'm here" (10/17/2012)  . S/P AVR (aortic valve replacement) 04/26/2019    Tobacco Use: Social History   Tobacco Use  Smoking Status Former Smoker  . Packs/day: 3.00  . Years: 30.00  . Pack years: 90.00  . Types: Cigarettes  . Start date: 05/19/1958  . Quit date: 06/30/1990  . Years since quitting: 28.8  Smokeless Tobacco Never Used  Tobacco  Comment   Stopped smoking for 2-3 years during second child.     Labs: Recent Review Flowsheet Data    Labs for ITP Cardiac and Pulmonary Rehab Latest Ref Rng & Units 01/31/2019 02/01/2019 02/01/2019 02/01/2019 02/02/2019   Cholestrol 0 - 200 mg/dL - - - - -   LDLCALC 0 - 99 mg/dL - - - - -   HDL >40 mg/dL - - - - -   Trlycerides <150 mg/dL - - - - -   Hemoglobin A1c 4.8 - 5.6 % - - - - -   PHART 7.350 - 7.450 - 7.356 7.310(L) 7.317(L) -   PCO2ART 32.0 - 48.0 mmHg - 36.7 43.5 41.0 -   HCO3 20.0 - 28.0 mmol/L - 20.7 22.0 21.1 -   TCO2 22 - 32 mmol/L 24 22 23 22  -   ACIDBASEDEF 0.0 - 2.0 mmol/L - 5.0(H) 4.0(H) 5.0(H) -    O2SAT % - 90.0 93.0 89.0 63.6      Capillary Blood Glucose: Lab Results  Component Value Date   GLUCAP 151 (H) 02/04/2019   GLUCAP 98 02/04/2019   GLUCAP 95 02/04/2019   GLUCAP 130 (H) 02/03/2019   GLUCAP 101 (H) 02/03/2019     Exercise Target Goals: Exercise Program Goal: Individual exercise prescription set using results from initial 6 min walk test and THRR while considering  patient's activity barriers and safety.   Exercise Prescription Goal: Starting with aerobic activity 30 plus minutes a day, 3 days per week for initial exercise prescription. Provide home exercise prescription and guidelines that participant acknowledges understanding prior to discharge.  Activity Barriers & Risk Stratification: Activity Barriers & Cardiac Risk Stratification - 03/30/19 1538      Activity Barriers & Cardiac Risk Stratification   Activity Barriers  Deconditioning;Muscular Weakness;Shortness of Breath    Cardiac Risk Stratification  High       6 Minute Walk: 6 Minute Walk    Row Name 03/30/19 1535         6 Minute Walk   Phase  Initial     Distance  1206 feet     Walk Time  6 minutes     # of Rest Breaks  1 Pt O2 level dropped to 86% on RA. Pt rested for 45 seconds on 2L O2 until O2 increased to 96%.     MPH  2.2     METS  2.6     RPE  13     Perceived Dyspnea   3     VO2 Peak  9.4     Symptoms  Yes (comment)     Comments  SOB +3, Pt O2 started at 94% RA. At minute 2:40 Pt O2 was 86%. 2L of O2 was added and after 45 seconds Pt O2 was 96%. Pt finished walk test on 2L O2.     Resting HR  73 bpm     Resting BP  104/72     Resting Oxygen Saturation   94 %     Exercise Oxygen Saturation  during 6 min walk  97 %     Max Ex. HR  96 bpm     Max Ex. BP  128/72     2 Minute Post BP  112/70        Oxygen Initial Assessment:   Oxygen Re-Evaluation:   Oxygen Discharge (Final Oxygen Re-Evaluation):   Initial Exercise Prescription: Initial Exercise Prescription -  03/30/19 1500      Date of  Initial Exercise RX and Referring Provider   Date  03/30/19    Referring Provider  Dr. [name]    Expected Discharge Date  05/26/19      NuStep   Level  2    SPM  75    Minutes  15    METs  2      Arm Ergometer   Level  1    Watts  15    Minutes  15    METs  2.57      Prescription Details   Frequency (times per week)  3    Duration  Progress to 30 minutes of continuous aerobic without signs/symptoms of physical distress      Intensity   THRR 40-80% of Max Heartrate  58-115    Ratings of Perceived Exertion  11-13    Perceived Dyspnea  0-4      Progression   Progression  Continue to progress workloads to maintain intensity without signs/symptoms of physical distress.      Resistance Training   Training Prescription  Yes    Weight  2 lbs.     Reps  10-15       Perform Capillary Blood Glucose checks as needed.  Exercise Prescription Changes: Exercise Prescription Changes    Row Name 04/03/19 1500 04/21/19 1500 05/01/19 1330         Response to Exercise   Blood Pressure (Admit)  100/62  112/58  112/68     Blood Pressure (Exercise)  110/70  104/60  128/62     Blood Pressure (Exit)  110/62  106/68  106/64     Heart Rate (Admit)  86 bpm  83 bpm  91 bpm     Heart Rate (Exercise)  86 bpm  93 bpm  105 bpm     Heart Rate (Exit)  82 bpm  85 bpm  90 bpm     Rating of Perceived Exertion (Exercise)  12  11  13      Perceived Dyspnea (Exercise)  0  0  0     Symptoms  None  None  None     Comments  Pt first day of exercise.   -  -     Duration  Continue with 30 min of aerobic exercise without signs/symptoms of physical distress.  Continue with 30 min of aerobic exercise without signs/symptoms of physical distress.  Continue with 30 min of aerobic exercise without signs/symptoms of physical distress.     Intensity  THRR unchanged  THRR unchanged  THRR unchanged       Progression   Progression  Continue to progress workloads to maintain intensity  without signs/symptoms of physical distress.  Continue to progress workloads to maintain intensity without signs/symptoms of physical distress.  Continue to progress workloads to maintain intensity without signs/symptoms of physical distress.     Average METs  2  2.2  2.4       Resistance Training   Training Prescription  Yes  Yes  Yes     Weight  2 lbs.   2 lbs.   2 lbs.      Reps  10-15  10-15  10-15     Time  10 Minutes  10 Minutes  10 Minutes       Interval Training   Interval Training  No  No  No       Oxygen   Oxygen  -  Continuous  Continuous     Liters  -  1L  1L       Treadmill   MPH  -  1.8  2     Grade  -  0  0     Minutes  -  15  15     METs  -  2.38  2.53       NuStep   Level  2  2  2      SPM  75  75  75     Minutes  15  15  15      METs  2  2.1  2.3       Arm Ergometer   Level  1  -  -     Watts  15  -  -     Minutes  15  -  -     METs  2.57  -  -       Home Exercise Plan   Plans to continue exercise at  -  Home (comment)  Home (comment)     Frequency  -  Add 3 additional days to program exercise sessions.  Add 3 additional days to program exercise sessions.     Initial Home Exercises Provided  -  04/21/19  04/21/19        Exercise Comments: Exercise Comments    Row Name 04/03/19 1510 04/21/19 1543 05/03/19 1330       Exercise Comments  Pt first day of CR program. Pt tolerated exercise Rx well.  Reviewed HEP with Pt. Pt understands home exercise goals.  Reviewed METs and goals with Pt. Pt is progressing well.        Exercise Goals and Review: Exercise Goals    Row Name 03/30/19 1540             Exercise Goals   Increase Physical Activity  Yes       Intervention  Provide advice, education, support and counseling about physical activity/exercise needs.;Develop an individualized exercise prescription for aerobic and resistive training based on initial evaluation findings, risk stratification, comorbidities and participant's personal goals.        Expected Outcomes  Short Term: Attend rehab on a regular basis to increase amount of physical activity.;Long Term: Add in home exercise to make exercise part of routine and to increase amount of physical activity.;Long Term: Exercising regularly at least 3-5 days a week.       Increase Strength and Stamina  Yes       Intervention  Provide advice, education, support and counseling about physical activity/exercise needs.;Develop an individualized exercise prescription for aerobic and resistive training based on initial evaluation findings, risk stratification, comorbidities and participant's personal goals.       Expected Outcomes  Short Term: Increase workloads from initial exercise prescription for resistance, speed, and METs.;Short Term: Perform resistance training exercises routinely during rehab and add in resistance training at home;Long Term: Improve cardiorespiratory fitness, muscular endurance and strength as measured by increased METs and functional capacity (6MWT)       Able to understand and use rate of perceived exertion (RPE) scale  Yes       Intervention  Provide education and explanation on how to use RPE scale       Expected Outcomes  Short Term: Able to use RPE daily in rehab to express subjective intensity level;Long Term:  Able to use RPE to guide intensity level when exercising independently       Able to understand and use Dyspnea scale  Yes       Intervention  Provide education and explanation on how to use Dyspnea scale       Expected Outcomes  Short Term: Able to use Dyspnea scale daily in rehab to express subjective sense of shortness of breath during exertion;Long Term: Able to use Dyspnea scale to guide intensity level when exercising independently       Knowledge and understanding of Target Heart Rate Range (THRR)  Yes       Intervention  Provide education and explanation of THRR including how the numbers were predicted and where they are located for reference       Expected  Outcomes  Short Term: Able to state/look up THRR;Long Term: Able to use THRR to govern intensity when exercising independently;Short Term: Able to use daily as guideline for intensity in rehab       Able to check pulse independently  Yes       Intervention  Provide education and demonstration on how to check pulse in carotid and radial arteries.;Review the importance of being able to check your own pulse for safety during independent exercise       Expected Outcomes  Short Term: Able to explain why pulse checking is important during independent exercise;Long Term: Able to check pulse independently and accurately       Understanding of Exercise Prescription  Yes       Intervention  Provide education, explanation, and written materials on patient's individual exercise prescription       Expected Outcomes  Short Term: Able to explain program exercise prescription;Long Term: Able to explain home exercise prescription to exercise independently          Exercise Goals Re-Evaluation : Exercise Goals Re-Evaluation    Row Name 04/03/19 1508 04/21/19 1538 05/03/19 1330         Exercise Goal Re-Evaluation   Exercise Goals Review  Understanding of Exercise Prescription;Increase Physical Activity;Able to understand and use Dyspnea scale;Increase Strength and Stamina;Knowledge and understanding of Target Heart Rate Range (THRR);Able to understand and use rate of perceived exertion (RPE) scale  Increase Physical Activity;Increase Strength and Stamina;Able to understand and use rate of perceived exertion (RPE) scale;Knowledge and understanding of Target Heart Rate Range (THRR);Able to check pulse independently;Understanding of Exercise Prescription  Increase Physical Activity;Increase Strength and Stamina;Able to understand and use rate of perceived exertion (RPE) scale;Knowledge and understanding of Target Heart Rate Range (THRR);Able to check pulse independently;Understanding of Exercise Prescription      Comments  Pt first day of CR program. Pt tolerated exercise well. Pt used 2L of O2 during exercise and O2 levels were 98% with exercise. Pt understands RPE scale, THRR, Dyspnea scale, and exercise Rx.  Reviewed HEP with Pt. Pt understands home exercise goals, THRR, RPE scale, weather precautions, end points of exercise, pulse counting, warm up and cool down stretches. Pt is currently walking at home 2-4 days per week for 30-45 minutes in addition to CR program.  Reviewed METs and goals with Pt. Pt is progressing well and has a MET level of 2.4. Pt has progressed to walking on a treadmill. Pt stated she is also using a treadmill at home 2-4 days per week for 20-30 minutes in addition to CR program.     Expected Outcomes  Will continue to monitor and progress Pt as tolerated.  Will continue to monitor and progress Pt as tolerated.  Will continue to monitor and progress Pt as tolerated.  Discharge Exercise Prescription (Final Exercise Prescription Changes): Exercise Prescription Changes - 05/01/19 1330      Response to Exercise   Blood Pressure (Admit)  112/68    Blood Pressure (Exercise)  128/62    Blood Pressure (Exit)  106/64    Heart Rate (Admit)  91 bpm    Heart Rate (Exercise)  105 bpm    Heart Rate (Exit)  90 bpm    Rating of Perceived Exertion (Exercise)  13    Perceived Dyspnea (Exercise)  0    Symptoms  None    Duration  Continue with 30 min of aerobic exercise without signs/symptoms of physical distress.    Intensity  THRR unchanged      Progression   Progression  Continue to progress workloads to maintain intensity without signs/symptoms of physical distress.    Average METs  2.4      Resistance Training   Training Prescription  Yes    Weight  2 lbs.     Reps  10-15    Time  10 Minutes      Interval Training   Interval Training  No      Oxygen   Oxygen  Continuous    Liters  1L      Treadmill   MPH  2    Grade  0    Minutes  15    METs  2.53      NuStep    Level  2    SPM  75    Minutes  15    METs  2.3      Home Exercise Plan   Plans to continue exercise at  Home (comment)    Frequency  Add 3 additional days to program exercise sessions.    Initial Home Exercises Provided  04/21/19       Nutrition:  Target Goals: Understanding of nutrition guidelines, daily intake of sodium <1531m, cholesterol <2013m calories 30% from fat and 7% or less from saturated fats, daily to have 5 or more servings of fruits and vegetables.  Biometrics: Pre Biometrics - 03/30/19 1540      Pre Biometrics   Height  5' 4"  (1.626 m)    Weight  52.8 kg    Waist Circumference  31 inches    Hip Circumference  36 inches    Waist to Hip Ratio  0.86 %    BMI (Calculated)  19.97    Triceps Skinfold  20 mm    % Body Fat  32.5 %    Grip Strength  29 kg    Flexibility  16.5 in    Single Leg Stand  30 seconds        Nutrition Therapy Plan and Nutrition Goals:   Nutrition Assessments:   Nutrition Goals Re-Evaluation:   Nutrition Goals Discharge (Final Nutrition Goals Re-Evaluation):   Psychosocial: Target Goals: Acknowledge presence or absence of significant depression and/or stress, maximize coping skills, provide positive support system. Participant is able to verbalize types and ability to use techniques and skills needed for reducing stress and depression.  Initial Review & Psychosocial Screening: Initial Psych Review & Screening - 03/30/19 1538      Initial Review   Current issues with  None Identified   JaIshias a history of depression but does not report any issues currently.     Family Dynamics   Good Support System?  Yes   Pt reports that her husband is a source of support for her.  Barriers   Psychosocial barriers to participate in program  There are no identifiable barriers or psychosocial needs.       Quality of Life Scores: Quality of Life - 03/30/19 1537      Quality of Life   Select  Quality of Life      Quality of  Life Scores   Health/Function Pre  27.43 %    Socioeconomic Pre  30 %    Psych/Spiritual Pre  28 %    Family Pre  30 %    GLOBAL Pre  28.45 %      Scores of 19 and below usually indicate a poorer quality of life in these areas.  A difference of  2-3 points is a clinically meaningful difference.  A difference of 2-3 points in the total score of the Quality of Life Index has been associated with significant improvement in overall quality of life, self-image, physical symptoms, and general health in studies assessing change in quality of life.  PHQ-9: Recent Review Flowsheet Data    Depression screen [facility] 2/9 04/03/2019   Decreased Interest 0   Down, Depressed, Hopeless 0   PHQ - 2 Score 0     Interpretation of Total Score  Total Score Depression Severity:  1-4 = Minimal depression, 5-9 = Mild depression, 10-14 = Moderate depression, 15-19 = Moderately severe depression, 20-27 = Severe depression   Psychosocial Evaluation and Intervention: Psychosocial Evaluation - 04/03/19 1649      Psychosocial Evaluation & Interventions   Interventions  Encouraged to exercise with the program and follow exercise prescription    Comments  Patient denies psychosocial barriers to exercise in CR or self health management. She acknolodges a strong support system of family, friends, and health care providers. She maintains a positive attitude and coping skills. Patient enjoys reading and gardening.    Expected Outcomes  Patient will continue to utilize her support system, maintain positive attitude, and healthy stress management skills if psychosocial barriers arise    Continue Psychosocial Services   No Follow up required       Psychosocial Re-Evaluation: Psychosocial Re-Evaluation    [place] Name 05/02/19 [phone]             Psychosocial Re-Evaluation   Current issues with  None Identified       Comments  Ms. Gelles continues to deny psychosocial barriers to participation in CR and self health  management. She has a strong support system of family, friends, and health care providers. She maintains a positive attitude regarding her health and future. She is extremely happy that she does not have to wear oxygen at night any longer and this alone has improved her mental health.       Expected Outcomes  Patient will continue to have a positive attitude and outlook. She will utilize her strong support system if psychosocial barriers do arise.       Interventions  Encouraged to attend Cardiac Rehabilitation for the exercise       Continue Psychosocial Services   No Follow up required          Psychosocial Discharge (Final Psychosocial Re-Evaluation): Psychosocial Re-Evaluation - 05/02/19 6063      Psychosocial Re-Evaluation   Current issues with  None Identified    Comments  Ms. Yahr continues to deny psychosocial barriers to participation in CR and self health management. She has a strong support system of family, friends, and health care providers. She maintains a positive attitude regarding her health  and future. She is extremely happy that she does not have to wear oxygen at night any longer and this alone has improved her mental health.    Expected Outcomes  Patient will continue to have a positive attitude and outlook. She will utilize her strong support system if psychosocial barriers do arise.    Interventions  Encouraged to attend Cardiac Rehabilitation for the exercise    Continue Psychosocial Services   No Follow up required       Vocational Rehabilitation: Provide vocational rehab assistance to qualifying candidates.   Vocational Rehab Evaluation & Intervention: Vocational Rehab - 03/30/19 1552      Initial Vocational Rehab Evaluation & Intervention   Assessment shows need for Vocational Rehabilitation  No       Education: Education Goals: Education classes will be provided on a weekly basis, covering required topics. Participant will state understanding/return  demonstration of topics presented.  Learning Barriers/Preferences: Learning Barriers/Preferences - 03/30/19 1542      Learning Barriers/Preferences   Learning Barriers  None    Learning Preferences  Skilled Demonstration       Education Topics: Hypertension, Hypertension Reduction -Define heart disease and high blood pressure. Discus how high blood pressure affects the body and ways to reduce high blood pressure.   Exercise and Your Heart -Discuss why it is important to exercise, the FITT principles of exercise, normal and abnormal responses to exercise, and how to exercise safely.   Angina -Discuss definition of angina, causes of angina, treatment of angina, and how to decrease risk of having angina.   Cardiac Medications -Review what the following cardiac medications are used for, how they affect the body, and side effects that may occur when taking the medications.  Medications include Aspirin, Beta blockers, calcium channel blockers, ACE Inhibitors, angiotensin receptor blockers, diuretics, digoxin, and antihyperlipidemics.   Congestive Heart Failure -Discuss the definition of CHF, how to live with CHF, the signs and symptoms of CHF, and how keep track of weight and sodium intake.   Heart Disease and Intimacy -Discus the effect sexual activity has on the heart, how changes occur during intimacy as we age, and safety during sexual activity.   Smoking Cessation / COPD -Discuss different methods to quit smoking, the health benefits of quitting smoking, and the definition of COPD.   Nutrition I: Fats -Discuss the types of cholesterol, what cholesterol does to the heart, and how cholesterol levels can be controlled.   Nutrition II: Labels -Discuss the different components of food labels and how to read food label   Heart Parts/Heart Disease and PAD -Discuss the anatomy of the heart, the pathway of blood circulation through the heart, and these are affected by heart  disease.   Stress I: Signs and Symptoms -Discuss the causes of stress, how stress may lead to anxiety and depression, and ways to limit stress.   Stress II: Relaxation -Discuss different types of relaxation techniques to limit stress.   Warning Signs of Stroke / TIA -Discuss definition of a stroke, what the signs and symptoms are of a stroke, and how to identify when someone is having stroke.   Knowledge Questionnaire Score: Knowledge Questionnaire Score - 03/30/19 1537      Knowledge Questionnaire Score   Pre Score  20/24       Core Components/Risk Factors/Patient Goals at Admission: Personal Goals and Risk Factors at Admission - 03/30/19 1543      Core Components/Risk Factors/Patient Goals on Admission    Weight Management  Yes;Weight Maintenance    Intervention  Weight Management: Develop a combined nutrition and exercise program designed to reach desired caloric intake, while maintaining appropriate intake of nutrient and fiber, sodium and fats, and appropriate energy expenditure required for the weight goal.;Weight Management: Provide education and appropriate resources to help participant work on and attain dietary goals.    Admit Weight  116 lb 6.5 oz (52.8 kg)    Expected Outcomes  Short Term: Continue to assess and modify interventions until short term weight is achieved;Long Term: Adherence to nutrition and physical activity/exercise program aimed toward attainment of established weight goal;Weight Maintenance: Understanding of the daily nutrition guidelines, which includes 25-35% calories from fat, 7% or less cal from saturated fats, less than 259m cholesterol, less than 1.5gm of sodium, & 5 or more servings of fruits and vegetables daily;Understanding recommendations for meals to include 15-35% energy as protein, 25-35% energy from fat, 35-60% energy from carbohydrates, less than 2037mof dietary cholesterol, 20-35 gm of total fiber daily;Understanding of distribution of  calorie intake throughout the day with the consumption of 4-5 meals/snacks    Improve shortness of breath with ADL's  Yes    Intervention  Provide education, individualized exercise plan and daily activity instruction to help decrease symptoms of SOB with activities of daily living.    Expected Outcomes  Short Term: Improve cardiorespiratory fitness to achieve a reduction of symptoms when performing ADLs;Long Term: Be able to perform more ADLs without symptoms or delay the onset of symptoms    Hypertension  Yes    Intervention  Provide education on lifestyle modifcations including regular physical activity/exercise, weight management, moderate sodium restriction and increased consumption of fresh fruit, vegetables, and low fat dairy, alcohol moderation, and smoking cessation.;Monitor prescription use compliance.    Expected Outcomes  Short Term: Continued assessment and intervention until BP is < 140/9060mG in hypertensive participants. < 130/61m60m in hypertensive participants with diabetes, heart failure or chronic kidney disease.;Long Term: Maintenance of blood pressure at goal levels.    Lipids  Yes    Intervention  Provide education and support for participant on nutrition & aerobic/resistive exercise along with prescribed medications to achieve LDL <70mg92mL >40mg.44mExpected Outcomes  Short Term: Participant states understanding of desired cholesterol values and is compliant with medications prescribed. Participant is following exercise prescription and nutrition guidelines.;Long Term: Cholesterol controlled with medications as prescribed, with individualized exercise RX and with personalized nutrition plan. Value goals: LDL < 70mg, 58m> 40 mg.    Stress  Yes    Intervention  Offer individual and/or small group education and counseling on adjustment to heart disease, stress management and health-related lifestyle change. Teach and support self-help strategies.;Refer participants experiencing  significant psychosocial distress to appropriate mental health specialists for further evaluation and treatment. When possible, include family members and significant others in education/counseling sessions.    Expected Outcomes  Short Term: Participant demonstrates changes in health-related behavior, relaxation and other stress management skills, ability to obtain effective social support, and compliance with psychotropic medications if prescribed.;Long Term: Emotional wellbeing is indicated by absence of clinically significant psychosocial distress or social isolation.       Core Components/Risk Factors/Patient Goals Review:  Goals and Risk Factor Review    Row Name 04/03/19 1652 05/02/19 0927           Core Components/Risk Factors/Patient Goals Review   Personal Goals Review  Weight Management/Obesity;Stress;Lipids;Improve shortness of breath with ADL's;Hypertension  Weight Management/Obesity;Stress;Lipids;Improve shortness of  breath with ADL's;Hypertension      Review  Patient with multiple CAD risk factors. She is eager to participate in CR. Her personal goals are to improve her O2 levels through exercise.  Patient with multiple CAD risk factors. She remains eager to participate in CR. She is meeting her personal goals which is to improve her O2 levels through exercise. She is taking all medications as prescribed and maintaining her weight. She feels her shortness of breath is improving significantly with utilizing PLB      Expected Outcomes  Patient will continue to participate in CR to modify risk factors and increase stamina and strength  Patient will continue to participate in CR to modify risk factors and increase stamina and strength         Core Components/Risk Factors/Patient Goals at Discharge (Final Review):  Goals and Risk Factor Review - 05/02/19 0927      Core Components/Risk Factors/Patient Goals Review   Personal Goals Review  Weight Management/Obesity;Stress;Lipids;Improve  shortness of breath with ADL's;Hypertension    Review  Patient with multiple CAD risk factors. She remains eager to participate in CR. She is meeting her personal goals which is to improve her O2 levels through exercise. She is taking all medications as prescribed and maintaining her weight. She feels her shortness of breath is improving significantly with utilizing PLB    Expected Outcomes  Patient will continue to participate in CR to modify risk factors and increase stamina and strength       ITP Comments: ITP Comments    Row Name 03/30/19 1418 04/03/19 1646 05/02/19 0919       ITP Comments  Dr. Fransico Him, Medical Director  30 day ITP review: Patient started Cardiac Rehab today and tolerated well. 02 at 2 LPM was utilized to keep sats >90% per cardiology orders.  30 day ITP review: Ms. Cuthbert continues to do well in cardiac rehab. She is tolerating workload increases and has learned to utilize pursed lip breathing to decrease her shortness of breath. We have been able to wean her oxygen to 1 LPM and still maintain saturations >90%. She has began to exercise at home by walking on the treadmill and utilizes her oxygen for exercise at home. Recently it has been clarified by her cardiac surgeon that she does not need to wear her oxygen at night, only with exertion and exercise. She continues to be followed by pulmonary for her COPD.        Comments: see ITP comments

## 2019-05-05 ENCOUNTER — Other Ambulatory Visit: Payer: Self-pay

## 2019-05-05 ENCOUNTER — Encounter (HOSPITAL_COMMUNITY)
Admission: RE | Admit: 2019-05-05 | Discharge: 2019-05-05 | Disposition: A | Discharge status: Home / Self Care | Payer: Medicare Other | Source: Ambulatory Visit | Attending: Cardiovascular Disease | Admitting: Cardiovascular Disease

## 2019-05-05 DIAGNOSIS — Z952 Presence of prosthetic heart valve: Secondary | ICD-10-CM | POA: Diagnosis not present

## 2019-05-08 ENCOUNTER — Telehealth (HOSPITAL_COMMUNITY): Payer: Self-pay | Admitting: Internal Medicine

## 2019-05-08 ENCOUNTER — Encounter (HOSPITAL_COMMUNITY): Payer: Medicare Other

## 2019-05-08 DIAGNOSIS — H40013 Open angle with borderline findings, low risk, bilateral: Secondary | ICD-10-CM | POA: Diagnosis not present

## 2019-05-08 DIAGNOSIS — H2513 Age-related nuclear cataract, bilateral: Secondary | ICD-10-CM | POA: Diagnosis not present

## 2019-05-10 ENCOUNTER — Encounter (HOSPITAL_COMMUNITY)
Admission: RE | Admit: 2019-05-10 | Discharge: 2019-05-10 | Disposition: A | Discharge status: Home / Self Care | Payer: Medicare Other | Source: Ambulatory Visit | Attending: Cardiovascular Disease | Admitting: Cardiovascular Disease

## 2019-05-10 ENCOUNTER — Other Ambulatory Visit: Payer: Self-pay

## 2019-05-10 DIAGNOSIS — Z952 Presence of prosthetic heart valve: Secondary | ICD-10-CM

## 2019-05-12 ENCOUNTER — Other Ambulatory Visit: Payer: Self-pay

## 2019-05-12 ENCOUNTER — Encounter (HOSPITAL_COMMUNITY)
Admission: RE | Admit: 2019-05-12 | Discharge: 2019-05-12 | Disposition: A | Discharge status: Home / Self Care | Payer: Medicare Other | Source: Ambulatory Visit | Attending: Cardiovascular Disease | Admitting: Cardiovascular Disease

## 2019-05-12 DIAGNOSIS — Z952 Presence of prosthetic heart valve: Secondary | ICD-10-CM | POA: Diagnosis not present

## 2019-05-15 ENCOUNTER — Encounter (HOSPITAL_COMMUNITY)
Admission: RE | Admit: 2019-05-15 | Discharge: 2019-05-15 | Disposition: A | Discharge status: Home / Self Care | Payer: Medicare Other | Source: Ambulatory Visit | Attending: Cardiovascular Disease | Admitting: Cardiovascular Disease

## 2019-05-15 ENCOUNTER — Other Ambulatory Visit: Payer: Self-pay

## 2019-05-15 DIAGNOSIS — Z952 Presence of prosthetic heart valve: Secondary | ICD-10-CM

## 2019-05-17 ENCOUNTER — Other Ambulatory Visit: Payer: Self-pay

## 2019-05-17 ENCOUNTER — Encounter: Payer: Self-pay | Admitting: Internal Medicine

## 2019-05-17 ENCOUNTER — Encounter (HOSPITAL_COMMUNITY)
Admission: RE | Admit: 2019-05-17 | Discharge: 2019-05-17 | Disposition: A | Discharge status: Home / Self Care | Payer: Medicare Other | Source: Ambulatory Visit | Attending: Cardiovascular Disease | Admitting: Cardiovascular Disease

## 2019-05-17 ENCOUNTER — Ambulatory Visit (INDEPENDENT_AMBULATORY_CARE_PROVIDER_SITE_OTHER): Payer: Medicare Other | Admitting: Internal Medicine

## 2019-05-17 DIAGNOSIS — I10 Essential (primary) hypertension: Secondary | ICD-10-CM | POA: Diagnosis not present

## 2019-05-17 DIAGNOSIS — J9611 Chronic respiratory failure with hypoxia: Secondary | ICD-10-CM

## 2019-05-17 DIAGNOSIS — J449 Chronic obstructive pulmonary disease, unspecified: Secondary | ICD-10-CM | POA: Diagnosis not present

## 2019-05-17 DIAGNOSIS — Z952 Presence of prosthetic heart valve: Secondary | ICD-10-CM

## 2019-05-17 NOTE — Assessment & Plan Note (Signed)
Quit smoking 1992  01/04/17: FVC 2.17 L (79%) FEV1 1.32 L (64%) FEV1/FVC 0.61 FEF 25-75 0.57 L (34%) negative bronchodilator response TLC 5.23 L (105%) RV 136% ERV 39% DLCO corrected 43% - PFT's  01/05/2019  FEV1 1.30 (64 % ) ratio 0.62  p 6 % improvement from saba p ? prior to study with DLCO  7.27 (39%) corrects to 2.15 (52%)  for alv volume and FV curve classic concave pattern   Doing fine on no maint rx, has saba prn   >>> f/u at 6 m - call sooner prn

## 2019-05-17 NOTE — Progress Notes (Signed)
Dana Pruitt, female    DOB: 05-Nov-1940     MRN: [id number]   Brief patient profile:   47 yowf quit smoking 1992 previously followed by Dr Dana Pruitt with  GOLD II copd :   PFT 01/04/17: FVC 2.17 L (79%) FEV1 1.32 L (64%) FEV1/FVC 0.61 FEF 25-75 0.57 L (34%) negative bronchodilator response TLC 5.23 L (105%) RV 136% ERV 39% DLCO corrected 43% 12/30/12: FVC 2.77 L (101%) FEV1 1.92 L (89%) FEV1/FVC 0.69 FEF 25-75 1.13 L (51%) negative bronchodilator response TLC 4.77 L (97%) RV 110% DLCO uncorrected 51%  6MWT 03/08/17:  Walked 414 meters / Baseline Sat 96% on RA / Nadir Sat 94% on RA (stopped with 4:37 due to poor pulse ox signal)    CARDIAC TTE (07/14/16):  LV normal in size with EF 60-65% & no regional wall motion abnormalities. Grade 1 diastolic dysfunction. LA & RA normal in size. RV mildly dilated with reduced systolic function. Moderate aortic regurgitation without stenosis. Ascending aorta is mildly dilated with aortic root normal in size. No mitral stenosis or regurgitation. Mild pulmonic regurgitation. No significant tricuspid regurgitation. No pericardial effusion.    Admit date: 01/31/2019 Discharge date: 02/10/2019  Admission Diagnoses: Severe aortic insufficiency  Discharge Diagnoses:  Active Problems:   Aortic valve disease    hx The patient is a 78 year old female referred to Dr. Harl Pruitt right for evaluation of severe aortic insufficiency.  She has been seen by her cardiologist for several years with serial echocardiogram.  Her most recent echo revealed severe AI with some LV dilatation and reduced LV systolic function.  She underwent right and left heart catheterization and was found to have no significant coronary artery disease.  Right heart catheterization showed normal pressures with mixed venous saturations of 68%.  LVEDP was normal and cardiac index was 2.9.  Pulmonary function studies were performed due to a history of smoking heavy smoking in the past, although  really remote.  Her FEV1 was 1.2 and DLCO was 40%.  A CT scan showed the ascending aorta measuring 4.0 cm.  There was findings of panlobular emphysema noted on the CT scan.  Dr. Darcey Pruitt evaluated the patient and all her studies and recommended proceeding with elective aortic valve replacement and she was admitted this hospitalization for the procedure.   Hospital Course: The patient was admitted electively and on 01/31/2019 taken to the operating room where she underwent Aortic valve replacement with a 21 mm Inspiris Edwards valve..  She tolerated it well and was taken to the surgical intensive care unit in stable condition.  Postoperative hospital course:  The patient has done well postoperatively.  She has remained neurologically intact.  She was weaned from the ventilator using standard protocols without difficulty.  She initially required some inotropic support with dobutamine but this was able to be weaned without difficulty.  She was also started on Midodrine to assist with blood pressure.  She is not felt to be a candidate at this time for ACE inhibitor or ARB.  She does have some postoperative volume overload and is responding to diuretics.  She has had postoperative atrial fibrillation and has been started on an amiodarone drip per usual protocol.  She was transitioned to p.o. amiodarone and subsequently was chemically cardioverted to sinus rhythm with only occasional runs of afib. Metoprolol was uptitrated and additionally she was started on Coumadin.  At the time of discharge the patient was very stable.  Home health arrangements were also made to have INR  checked.  Additionally she did require home oxygen to be arranged with lower sats with ambulation in the qualifying range.  She has known severe COPD.      History of Present Illness  04/19/2019  Pulmonary/ 1st office eval/Dana Pruitt  Chief Complaint  Patient presents with   New Patient (Initial Visit)    Seen in the past by Dr  Dana Pruitt. She states she has been doing cardiac rehab at [facility] and having issues with DOE and low o2 sats.   Dyspnea:  Was walking in hallways prior to d/c with sats 87% so rec 2lpm sleeping and walking but did not  Use it that way and in rehab same problem so started on 2lpm but has been  weaned to 1pm  At home does dog walking up and down hills,  Finds talking and walking are tough/ has not noticed hoarseness  Cough: no Sleep: flat  SABA use: none  - has not helped. rec Try off lisinopril for now  Start Micardis 80 mg one daily x 4 weeks  Make sure you check your oxygen saturations at highest level of activity to be sure it stays over 90% and adjust upward to maintain this level if needed   Virtual Visit via Telephone Note 05/17/2019   I connected with Dana Pruitt on 05/17/19 at 11:30 AM EST by telephone and verified that I am speaking with the correct person using two identifiers.   I discussed the limitations, risks, security and privacy concerns of performing an evaluation and management service by telephone and the availability of in person appointments. I also discussed with the patient that there may be a patient responsible charge related to this service. The patient expressed understanding and agreed to proceed.   History of Present Illness: Dyspnea:  Progress with rehab on 1 lpm /doing gxt twice weekly x 20 min x 35mph x level x 1lpm  Cough: much better, minimal need for mucinex Sleeping: flat ok  SABA use: none  02: prn daytime    No obvious day to day or daytime variability or assoc excess/ purulent sputum or mucus plugs or hemoptysis or cp or chest tightness, subjective wheeze or overt sinus or hb symptoms.    Also denies any obvious fluctuation of symptoms with weather or environmental changes or other aggravating or alleviating factors except as outlined above.   Meds reviewed/ med reconciliation completed         Observations/Objective: Minimal hoarseness, no  throat clearing, very upbeat, no wob noted    Assessment and Plan: See problem list for active a/p's   Follow Up Instructions: See avs for instructions unique to this ov which includes revised/ updated med list     I discussed the assessment and treatment plan with the patient. The patient was provided an opportunity to ask questions and all were answered. The patient agreed with the plan and demonstrated an understanding of the instructions.   The patient was advised to call back or seek an in-person evaluation if the symptoms worsen or if the condition fails to improve as anticipated.  I provided 15 minutes of non-face-to-face time during this encounter.   Christinia Gully, MD        Past Medical History:  Diagnosis Date   Anemia    "several times over the years" (10/17/2012)   Anxiety    Breast cancer ([place]) 02/2010   right breast   Carcinoma of breast treated with adjuvant hormone therapy ([place]) 9/11   Femara  CHF (congestive heart failure) ([facility])    COPD (chronic obstructive pulmonary disease) ([facility])    Depression    Exertional shortness of breath    "recently" (10/17/2012)   GERD (gastroesophageal reflux disease)    diet controlled, no med   Hollenhorst plaque, left eye    apparently resolved at recent ophthalmology visit   Hyperlipidemia    Hypertension    IDC, RightStage II, 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

## 2019-05-17 NOTE — Assessment & Plan Note (Signed)
Try off acei 04/19/2019 for hoarseness, doe > improved 05/17/2019   Although even in retrospect it may not be clear the ACEi contributed to the pt's symptoms,  Pt improved off them and adding them back at this point or in the future would risk confusion in interpretation of non-specific respiratory symptoms to which this patient is prone  ie  Better not to muddy the waters here.   >>> self monitor, refill per PCP    Each maintenance medication was reviewed in detail including most importantly the difference between maintenance and as needed and under what circumstances the prns are to be used.  Please see AVS for specific  Instructions which are unique to this visit and I personally typed out  which were reviewed in detail in writing with the patient and a copy provided.

## 2019-05-17 NOTE — Patient Instructions (Addendum)
Goal is to keep your 02 level above 90% at peak exercise   Please schedule a follow up visit in 6 months but call sooner if needed

## 2019-05-19 ENCOUNTER — Encounter (HOSPITAL_COMMUNITY): Payer: Medicare Other

## 2019-05-19 DIAGNOSIS — J449 Chronic obstructive pulmonary disease, unspecified: Secondary | ICD-10-CM | POA: Diagnosis not present

## 2019-05-22 ENCOUNTER — Other Ambulatory Visit: Payer: Self-pay

## 2019-05-22 ENCOUNTER — Encounter (HOSPITAL_COMMUNITY)
Admission: RE | Admit: 2019-05-22 | Discharge: 2019-05-22 | Disposition: A | Discharge status: Home / Self Care | Payer: Medicare Other | Source: Ambulatory Visit | Attending: Cardiovascular Disease | Admitting: Cardiovascular Disease

## 2019-05-22 DIAGNOSIS — Z952 Presence of prosthetic heart valve: Secondary | ICD-10-CM | POA: Diagnosis not present

## 2019-05-22 DIAGNOSIS — Z85828 Personal history of other malignant neoplasm of skin: Secondary | ICD-10-CM | POA: Diagnosis not present

## 2019-05-22 DIAGNOSIS — L821 Other seborrheic keratosis: Secondary | ICD-10-CM | POA: Diagnosis not present

## 2019-05-22 DIAGNOSIS — D1801 Hemangioma of skin and subcutaneous tissue: Secondary | ICD-10-CM | POA: Diagnosis not present

## 2019-05-24 ENCOUNTER — Encounter (HOSPITAL_COMMUNITY)
Admission: RE | Admit: 2019-05-24 | Discharge: 2019-05-24 | Disposition: A | Discharge status: Home / Self Care | Payer: Medicare Other | Source: Ambulatory Visit | Attending: Cardiovascular Disease | Admitting: Cardiovascular Disease

## 2019-05-24 ENCOUNTER — Encounter (HOSPITAL_COMMUNITY): Payer: Self-pay

## 2019-05-24 ENCOUNTER — Other Ambulatory Visit: Payer: Self-pay

## 2019-05-24 VITALS — Ht 64.0 in | Wt 125.7 lb

## 2019-05-24 DIAGNOSIS — Z952 Presence of prosthetic heart valve: Secondary | ICD-10-CM | POA: Insufficient documentation

## 2019-05-26 ENCOUNTER — Encounter (HOSPITAL_COMMUNITY)
Admission: RE | Admit: 2019-05-26 | Discharge: 2019-05-26 | Disposition: A | Discharge status: Home / Self Care | Payer: Medicare Other | Source: Ambulatory Visit | Attending: Cardiovascular Disease | Admitting: Cardiovascular Disease

## 2019-05-26 ENCOUNTER — Telehealth: Payer: Self-pay | Admitting: Internal Medicine

## 2019-05-26 ENCOUNTER — Other Ambulatory Visit: Payer: Self-pay

## 2019-05-26 DIAGNOSIS — Z952 Presence of prosthetic heart valve: Secondary | ICD-10-CM

## 2019-05-26 NOTE — Telephone Encounter (Signed)
I called cardio rehab but they were closed. I left a message to have them call back on Monday.

## 2019-05-29 NOTE — Telephone Encounter (Signed)
Spoke with Portia at cardiac rehab  She states pt desaturated on her 6MW after walking 3 lpm  and cardiology advised that she needs 2lpm o2 with exertion  She has tanks at home and o2 concentrator  She states that the pt is not really wanting to use o2 bc she does not have a portable system, and therefore is not using it at home with exertion like she should. Pt not due to come back here until May 2020  Please advise thanks!

## 2019-05-29 NOTE — Telephone Encounter (Signed)
Ov with POC  trial next step

## 2019-05-29 NOTE — Telephone Encounter (Signed)
LMTCB

## 2019-05-30 NOTE — Assessment & Plan Note (Signed)
D/c 01/31/2019  on 02 p AVR   As of 05/17/2019 rx   titrate with activity to keep sats > 90%    >>> advised: Make sure you check your oxygen saturations at highest level of activity to be sure it stays over 90% and adjust upward to maintain this level if needed but remember to turn it back to previous settings when you stop (to conserve your supply).

## 2019-05-30 NOTE — Telephone Encounter (Signed)
Called and spoke with pt letting her know that MW said to schedule appt to have her come to office so we could see if we could get her set up on POC. Pt verbalized understanding. appt scheduled for pt with MW tomorrow 12/9 at 11:15. Nothing further needed.

## 2019-05-31 ENCOUNTER — Encounter: Payer: Self-pay | Admitting: Internal Medicine

## 2019-05-31 ENCOUNTER — Other Ambulatory Visit: Payer: Self-pay

## 2019-05-31 ENCOUNTER — Ambulatory Visit: Payer: Medicare Other | Admitting: Internal Medicine

## 2019-05-31 DIAGNOSIS — J449 Chronic obstructive pulmonary disease, unspecified: Secondary | ICD-10-CM

## 2019-05-31 MED ORDER — STIOLTO RESPIMAT 2.5-2.5 MCG/ACT IN AERS: 2.0000 | INHALATION_SPRAY | Freq: Every day | RESPIRATORY_TRACT | 0 refills | Status: DC

## 2019-05-31 MED ORDER — STIOLTO RESPIMAT 2.5-2.5 MCG/ACT IN AERS: 2.0000 | INHALATION_SPRAY | Freq: Every day | RESPIRATORY_TRACT | 11 refills | Status: DC

## 2019-05-31 NOTE — Progress Notes (Signed)
Dana Pruitt, female    DOB: 02-Apr-1941     MRN: [id number]   Brief patient profile:   86 yowf MM/quit smoking 1992 previously followed by Dr Ashok Cordia with  GOLD II copd    PFT 01/04/17: FVC 2.17 L (79%) FEV1 1.32 L (64%) FEV1/FVC 0.61 FEF 25-75 0.57 L (34%) negative bronchodilator response TLC 5.23 L (105%) RV 136% ERV 39% DLCO corrected 43% 12/30/12: FVC 2.77 L (101%) FEV1 1.92 L (89%) FEV1/FVC 0.69 FEF 25-75 1.13 L (51%) negative bronchodilator response TLC 4.77 L (97%) RV 110% DLCO uncorrected 51%  6MWT 03/08/17:  Walked 414 meters / Baseline Sat 96% on RA / Nadir Sat 94% on RA (stopped with 4:37 due to poor pulse ox signal)    CARDIAC TTE (07/14/16):  LV normal in size with EF 60-65% & no regional wall motion abnormalities. Grade 1 diastolic dysfunction. LA & RA normal in size. RV mildly dilated with reduced systolic function. Moderate aortic regurgitation without stenosis. Ascending aorta is mildly dilated with aortic root normal in size. No mitral stenosis or regurgitation. Mild pulmonic regurgitation. No significant tricuspid regurgitation. No pericardial effusion.    Admit date: 01/31/2019 Discharge date: 02/10/2019  Admission Diagnoses: Severe aortic insufficiency  Discharge Diagnoses:  Active Problems:   Aortic valve disease    hx The patient is a 78 year old female referred to Dr. [name] right for evaluation of severe aortic insufficiency.  She has been seen by her cardiologist for several years with serial echocardiogram.  Her most recent echo revealed severe AI with some LV dilatation and reduced LV systolic function.  She underwent right and left heart catheterization and was found to have no significant coronary artery disease.  Right heart catheterization showed normal pressures with mixed venous saturations of 68%.  LVEDP was normal and cardiac index was 2.9.  Pulmonary function studies were performed due to a history of smoking heavy smoking in the past,  although really remote.  Her FEV1 was 1.2 and DLCO was 40%.  A CT scan showed the ascending aorta measuring 4.0 cm.  There was findings of panlobular emphysema noted on the CT scan.  Dr. [name] evaluated the patient and all her studies and recommended proceeding with elective aortic valve replacement and she was admitted this hospitalization for the procedure.   Hospital Course: The patient was admitted electively and on 01/31/2019 taken to the operating room where she underwent Aortic valve replacement with a 21 mm Inspiris Edwards valve..  She tolerated it well and was taken to the surgical intensive care unit in stable condition.  Postoperative hospital course:  The patient has done well postoperatively.  She has remained neurologically intact.  She was weaned from the ventilator using standard protocols without difficulty.  She initially required some inotropic support with dobutamine but this was able to be weaned without difficulty.  She was also started on Midodrine to assist with blood pressure.  She is not felt to be a candidate at this time for ACE inhibitor or ARB.  She does have some postoperative volume overload and is responding to diuretics.  She has had postoperative atrial fibrillation and has been started on an amiodarone drip per usual protocol.  She was transitioned to p.o. amiodarone and subsequently was chemically cardioverted to sinus rhythm with only occasional runs of afib. Metoprolol was uptitrated and additionally she was started on Coumadin.  At the time of discharge the patient was very stable.  Home health arrangements were also made to have INR  checked.  Additionally she did require home oxygen to be arranged with lower sats with ambulation in the qualifying range.  She has known severe COPD.      History of Present Illness  04/19/2019  Pulmonary/ 1st office eval/Dana Pruitt  Chief Complaint  Patient presents with  . New Patient (Initial Visit)    Seen in the past by  Dr Ashok Cordia. She states she has been doing cardiac rehab at [facility] and having issues with DOE and low o2 sats.   Dyspnea:  Was walking in hallways prior to d/c with sats 87% so rec 2lpm sleeping and walking but did not  Use it that way and in rehab same problem so started on 2lpm but has been  weaned to 1pm  At home does dog walking up and down hills,  Finds talking and walking are tough/ has not noticed hoarseness  Cough: no Sleep: flat  SABA use: none  - has not helped. rec Try off lisinopril for now  Start Micardis 80 mg one daily x 4 weeks  Make sure you check your oxygen saturations at highest level of activity to be sure it stays over 90% and adjust upward to maintain this level if needed but remember to turn it back to previous settings when you stop (to conserve your supply).    televist rex Goal is to keep your 02 level above 90% at peak exercise      05/31/2019  f/u ov/Dana Pruitt re: GOLD II/ ex hypoxemia  Chief Complaint  Patient presents with  . Follow-up    Here to walk with POC to see how she tolerates.    Dyspnea:  Goal is to be able to do gxt x 2.3 mph x 30 min but uses 2lpm do to this at home does not check sats on or off 02 and uses a large tank for this purpose  Cough: minimal/ non prod Sleeping: bed flat, on side  SABA use: none  02: just with exertion   No obvious day to day or daytime variability or assoc excess/ purulent sputum or mucus plugs or hemoptysis or cp or chest tightness, subjective wheeze or overt sinus or hb symptoms.   Sleeping  without nocturnal  or early am exacerbation  of respiratory  c/o's or need for noct saba. Also denies any obvious fluctuation of symptoms with weather or environmental changes or sher aggravating or alleviating factors except as outlined above   No unusual exposure hx or h/o childhood pna/ asthma or knowledge of premature birth.  Current Allergies, Complete Past Medical History, Past Surgical History, Family History, and Social  History were reviewed in [employer] record.  ROS  The following are not active complaints unless bolded Hoarseness, sore throat, dysphagia, dental problems, itching, sneezing,  nasal congestion or discharge of excess mucus or purulent secretions, ear ache,   fever, chills, sweats, unintended wt loss or wt gain, classically pleuritic or exertional cp,  orthopnea pnd or arm/hand swelling  or leg swelling, presyncope, palpitations, abdominal pain, anorexia, nausea, vomiting, diarrhea  or change in bowel habits or change in bladder habits, change in stools or change in urine, dysuria, hematuria,  rash, arthralgias, visual complaints, headache, numbness, weakness or ataxia or problems with walking or coordination,  change in mood= anxiety or  memory.        Current Meds  Medication Sig  . amoxicillin (AMOXIL) 500 MG tablet TAKE 4 TABLETS 1 HOUR BEFORE DENTAL EXAMINATION (Patient taking differently: as needed. TAKE  4 TABLETS 1 HOUR BEFORE DENTAL EXAMINATION)  . aspirin EC 81 MG tablet Take 81 mg by mouth daily.  [name] atorvastatin (LIPITOR) 20 MG tablet Take 20 mg by mouth daily.   . carvedilol (COREG) 12.5 MG tablet Take 1 tablet (12.5 mg total) by mouth 2 (two) times daily with a meal.  . cholecalciferol (VITAMIN D3) 25 MCG (1000 UT) tablet Take 1,000 Units by mouth daily.  [name] denosumab (PROLIA) 60 MG/ML SOSY injection Inject 60 mg into the skin every 6 (six) months.  [name] guaiFENesin (MUCINEX) 600 MG 12 hr tablet Take 1 tablet (600 mg total) by mouth 2 (two) times daily.  . sertraline (ZOLOFT) 100 MG tablet Take 100 mg by mouth 2 (two) times a day.   . telmisartan (MICARDIS) 80 MG tablet Take 1 tablet (80 mg total) by mouth daily.  . vitamin B-12 (CYANOCOBALAMIN) 500 MCG tablet Take 500 mcg by mouth daily.             Past Medical History:  Diagnosis Date  . Anemia    "several times over the years" (10/17/2012)  . Anxiety   . Breast cancer ([place]) 02/2010   right breast  .  Carcinoma of breast treated with adjuvant hormone therapy ([place]) 9/11   Femara  . CHF (congestive heart failure) ([place])   . COPD (chronic obstructive pulmonary disease) ([place])   . Depression   . Exertional shortness of breath    "recently" (10/17/2012)  . GERD (gastroesophageal reflux disease)    diet controlled, no med  . Hollenhorst plaque, left eye    apparently resolved at recent ophthalmology visit  . Hyperlipidemia   . Hypertension   . 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 Left ventricular dysfunction   . Moderate aortic insufficiency   . Pneumonia    "that's why I'm here" (10/17/2012)       Objective:      amb wf nad  Wt Readings from Last 3 Encounters:  05/31/19 125 lb (56.7 kg)  05/24/19 125 lb 10.6 oz (57 kg)  04/26/19 122 lb 9.6 oz (55.6 kg)     Vital signs reviewed - Note on arrival 02 sats  91% on RA       HEENT : pt wearing mask not removed for exam due to covid - 19 concerns.   NECK :  without JVD/Nodes/TM/ nl carotid upstrokes bilaterally   LUNGS: no acc muscle use,  Min barrel  contour chest wall with bilateral  slightly decreased bs s audible wheeze and  without cough on insp or exp maneuvers and min  Hyperresonant  to  percussion bilaterally     CV:  RRR  no s3 or murmur or increase in P2, and no edema   ABD:  soft and nontender with pos end  insp Hoover's  in the supine position. No bruits or organomegaly appreciated, bowel sounds nl  MS:   Nl gait/  ext warm without deformities, calf tenderness, cyanosis or clubbing No obvious joint restrictions   SKIN: warm and dry without lesions    NEURO:  alert, approp, nl sensorium with  no motor or cerebellar deficits apparent.         I personally reviewed images and agree with radiology impression as follows:  CXR:   03/08/2019 No acute cardiopulmonary disease. Complete resolution of the pleural effusions seen on the prior study. No hiatal hernia. Aortic atherosclerosis.     Assessment

## 2019-05-31 NOTE — Patient Instructions (Addendum)
Stiolto is 2 pffs each am x 15 days on a trial basis - go ahead and fill the prescription if you like it   Work on inhaler technique:  relax and gently blow all the way out then take a nice smooth deep breath back in, triggering the inhaler at same time you start breathing in.  Hold for up to 5 seconds if you can.  Rinse and gargle with water when done  Make sure you check your oxygen saturations at highest level of activity to be sure it stays over 90% and adjust upward to maintain this level if needed but remember to turn it back to previous settings when you stop (to conserve your supply).   Please schedule a follow up visit in 3 months but call sooner if needed - change the appt you have

## 2019-05-31 NOTE — Assessment & Plan Note (Addendum)
Quit smoking 1992  01/04/17: FVC 2.17 L (79%) FEV1 1.32 L (64%) FEV1/FVC 0.61 FEF 25-75 0.57 L (34%) negative bronchodilator response TLC 5.23 L (105%) RV 136% ERV 39% DLCO corrected 43% - alpha one AT  03/08/17   MM  Level 164  - PFT's  01/05/2019  FEV1 1.30 (64 % ) ratio 0.62  p 6 % improvement from saba p ? prior to study with DLCO  7.27 (39%) corrects to 2.15 (52%)  for alv volume and FV curve classic concave pattern - 05/31/2019  After extensive coaching inhaler device,  effectiveness =    75% (delayed insp) with SMI > try stiolto 2 puffs each am x 2 week trial     Pt is Group B in terms of symptom/risk and laba/lama therefore appropriate rx at this point >>>   Reasonable to try lama/laba to see if can meet her goals of gxt ex s desat but if no change in symptoms or 02 dep can probably just use 02 but provide a POC which she shoul easily qualify for   Advised:  formulary restrictions will be an ongoing challenge for the forseable future and I would be happy to pick an alternative if the pt will first  provide me a list of them -  pt  will need to return here for training for any new device that is required eg dpi vs hfa vs respimat.    In the meantime we can always provide samples so that the patient never runs out of any needed respiratory medications.   Pt informed of the seriousness of COVID 19 infection as a direct risk to their health  and safey and to those of their loved ones and should continue to wear facemask in public and minimize exposure to public locations but especially avoid any area or activity where non-close contacts are not observing distancing or wearing an appropriate face mask.   >>>  3 month f/u, sooner if needed   I had an extended discussion with the patient reviewing all relevant studies completed to date and  lasting 15 to 20 minutes of a 25 minute visit    I performed detailed device teaching using a teach back method which extended face to face time for this visit  (see above)  Each maintenance medication was reviewed in detail including emphasizing most importantly the difference between maintenance and prns and under what circumstances the prns are to be triggered using an action plan format that is not reflected in the computer generated alphabetically organized AVS which I have not found useful in most complex patients, especially with respiratory illnesses  Please see AVS for specific instructions unique to this visit that I personally wrote and verbalized to the the pt in detail and then reviewed with pt  by my nurse highlighting any  changes in therapy recommended at today's visit to their plan of care.

## 2019-06-01 NOTE — Progress Notes (Signed)
Discharge Progress Report  Patient Details  Name: Dana Pruitt MRN: [id number] Date of Birth: 02/12/1941 Referring Provider:     CARDIAC REHAB PHASE II ORIENTATION from 03/30/2019 in [facility]  Referring Provider  Dr. [name]       Number of Visits: 20  Reason for Discharge:  Patient reached a stable level of exercise. Patient independent in their exercise. Patient has met program and personal goals.  Smoking History:  Social History   Tobacco Use  Smoking Status Former Smoker  . Packs/day: 3.00  . Years: 30.00  . Pack years: 90.00  . Types: Cigarettes  . Start date: 05/19/1958  . Quit date: 06/30/1990  . Years since quitting: 28.9  Smokeless Tobacco Never Used  Tobacco Comment   Stopped smoking for 2-3 years during second child.     Diagnosis:  S/P AVR (aortic valve replacement)  ADL UCSD:   Initial Exercise Prescription: Initial Exercise Prescription - 03/30/19 1500      Date of Initial Exercise RX and Referring Provider   Date  03/30/19    Referring Provider  Dr. [name]    Expected Discharge Date  05/26/19      NuStep   Level  2    SPM  75    Minutes  15    METs  2      Arm Ergometer   Level  1    Watts  15    Minutes  15    METs  2.57      Prescription Details   Frequency (times per week)  3    Duration  Progress to 30 minutes of continuous aerobic without signs/symptoms of physical distress      Intensity   THRR 40-80% of Max Heartrate  58-115    Ratings of Perceived Exertion  11-13    Perceived Dyspnea  0-4      Progression   Progression  Continue to progress workloads to maintain intensity without signs/symptoms of physical distress.      Resistance Training   Training Prescription  Yes    Weight  2 lbs.     Reps  10-15       Discharge Exercise Prescription (Final Exercise Prescription Changes): Exercise Prescription Changes - 05/26/19 1500      Response to Exercise   Blood Pressure (Admit)   104/60    Blood Pressure (Exercise)  142/72    Blood Pressure (Exit)  122/66    Heart Rate (Admit)  81 bpm    Heart Rate (Exercise)  104 bpm    Heart Rate (Exit)  79 bpm    Rating of Perceived Exertion (Exercise)  14    Perceived Dyspnea (Exercise)  0    Symptoms  None    Comments  Pt last day of exercise.     Duration  Continue with 30 min of aerobic exercise without signs/symptoms of physical distress.    Intensity  THRR unchanged      Progression   Progression  Continue to progress workloads to maintain intensity without signs/symptoms of physical distress.    Average METs  2.8      Resistance Training   Training Prescription  Yes    Weight  2 lbs.     Reps  10-15    Time  10 Minutes      Interval Training   Interval Training  No      Oxygen   Oxygen  Continuous  Liters  1L      Treadmill   MPH  2.3    Grade  1    Minutes  15    METs  3.08      NuStep   Level  3    SPM  75    Minutes  15    METs  2.6      Home Exercise Plan   Plans to continue exercise at  Home (comment)    Frequency  Add 3 additional days to program exercise sessions.    Initial Home Exercises Provided  04/21/19       Functional Capacity: 6 Minute Walk    Row Name 03/30/19 1535 05/24/19 1510       6 Minute Walk   Phase  Initial  Discharge    Distance  1206 feet  1478 feet    Distance % Change  --  22.55 %    Distance Feet Change  --  272 ft    Walk Time  6 minutes  6 minutes    # of Rest Breaks  1 Pt O2 level dropped to 86% on RA. Pt rested for 45 seconds on 2L O2 until O2 increased to 96%.  0    MPH  2.2  2.7    METS  2.6  3.3    RPE  13  14    Perceived Dyspnea   3  2    VO2 Peak  9.4  11.6    Symptoms  Yes (comment)  Yes (comment)    Comments  SOB +3, Pt O2 started at 94% RA. At minute 2:40 Pt O2 was 86%. 2L of O2 was added and after 45 seconds Pt O2 was 96%. Pt finished walk test on 2L O2.  SOB +2    Resting HR  73 bpm  81 bpm    Resting BP  104/72  104/60    Resting  Oxygen Saturation   94 %  92 %    Exercise Oxygen Saturation  during 6 min walk  97 %  92 % 2 L.    Max Ex. HR  96 bpm  109 bpm Room air    Max Ex. BP  128/72  142/72    2 Minute Post BP  112/70  122/62       Psychological, QOL, Others - Outcomes: PHQ 2/9: Depression screen [facility] 2/9 05/24/2019 04/03/2019  Decreased Interest 0 0  Down, Depressed, Hopeless 0 0  PHQ - 2 Score 0 0    Quality of Life: Quality of Life - 05/26/19 1553      Quality of Life   Select  Quality of Life      Quality of Life Scores   Health/Function Post  27.4 %    Socioeconomic Post  29.14 %    Psych/Spiritual Post  29 %    Family Post  30 %    GLOBAL Post  28.45 %       Personal Goals: Goals established at orientation with interventions provided to work toward goal. Personal Goals and Risk Factors at Admission - 03/30/19 1543      Core Components/Risk Factors/Patient Goals on Admission    Weight Management  Yes;Weight Maintenance    Intervention  Weight Management: Develop a combined nutrition and exercise program designed to reach desired caloric intake, while maintaining appropriate intake of nutrient and fiber, sodium and fats, and appropriate energy expenditure required for the weight goal.;Weight Management: Provide education and  appropriate resources to help participant work on and attain dietary goals.    Admit Weight  116 lb 6.5 oz (52.8 kg)    Expected Outcomes  Short Term: Continue to assess and modify interventions until short term weight is achieved;Long Term: Adherence to nutrition and physical activity/exercise program aimed toward attainment of established weight goal;Weight Maintenance: Understanding of the daily nutrition guidelines, which includes 25-35% calories from fat, 7% or less cal from saturated fats, less than 246m cholesterol, less than 1.5gm of sodium, & 5 or more servings of fruits and vegetables daily;Understanding recommendations for meals to include 15-35% energy as protein,  25-35% energy from fat, 35-60% energy from carbohydrates, less than 2021mof dietary cholesterol, 20-35 gm of total fiber daily;Understanding of distribution of calorie intake throughout the day with the consumption of 4-5 meals/snacks    Improve shortness of breath with ADL's  Yes    Intervention  Provide education, individualized exercise plan and daily activity instruction to help decrease symptoms of SOB with activities of daily living.    Expected Outcomes  Short Term: Improve cardiorespiratory fitness to achieve a reduction of symptoms when performing ADLs;Long Term: Be able to perform more ADLs without symptoms or delay the onset of symptoms    Hypertension  Yes    Intervention  Provide education on lifestyle modifcations including regular physical activity/exercise, weight management, moderate sodium restriction and increased consumption of fresh fruit, vegetables, and low fat dairy, alcohol moderation, and smoking cessation.;Monitor prescription use compliance.    Expected Outcomes  Short Term: Continued assessment and intervention until BP is < 140/9066mG in hypertensive participants. < 130/43m53m in hypertensive participants with diabetes, heart failure or chronic kidney disease.;Long Term: Maintenance of blood pressure at goal levels.    Lipids  Yes    Intervention  Provide education and support for participant on nutrition & aerobic/resistive exercise along with prescribed medications to achieve LDL <70mg79mL >40mg.65mExpected Outcomes  Short Term: Participant states understanding of desired cholesterol values and is compliant with medications prescribed. Participant is following exercise prescription and nutrition guidelines.;Long Term: Cholesterol controlled with medications as prescribed, with individualized exercise RX and with personalized nutrition plan. Value goals: LDL < 70mg, 33m> 40 mg.    Stress  Yes    Intervention  Offer individual and/or small group education and counseling  on adjustment to heart disease, stress management and health-related lifestyle change. Teach and support self-help strategies.;Refer participants experiencing significant psychosocial distress to appropriate mental health specialists for further evaluation and treatment. When possible, include family members and significant others in education/counseling sessions.    Expected Outcomes  Short Term: Participant demonstrates changes in health-related behavior, relaxation and other stress management skills, ability to obtain effective social support, and compliance with psychotropic medications if prescribed.;Long Term: Emotional wellbeing is indicated by absence of clinically significant psychosocial distress or social isolation.        Personal Goals Discharge: Goals and Risk Factor Review    Row Name 04/03/19 1652 05/02/19 0927 06/01/19 1621         Core Components/Risk Factors/Patient Goals Review   Personal Goals Review  Weight Management/Obesity;Stress;Lipids;Improve shortness of breath with ADL's;Hypertension  Weight Management/Obesity;Stress;Lipids;Improve shortness of breath with ADL's;Hypertension  Weight Management/Obesity;Stress;Lipids;Improve shortness of breath with ADL's;Hypertension     Review  Patient with multiple CAD risk factors. She is eager to participate in CR. Her personal goals are to improve her O2 levels through exercise.  Patient with multiple CAD risk factors.  She remains eager to participate in CR. She is meeting her personal goals which is to improve her O2 levels through exercise. She is taking all medications as prescribed and maintaining her weight. She feels her shortness of breath is improving significantly with utilizing PLB  [name] graduated from CR with 20 completed sessions.  She felt that she increased her motivation for exercise.  She was able to maintain her oxygen saturations on 1L.     Expected Outcomes  Patient will continue to participate in CR to modify risk  factors and increase stamina and strength  Patient will continue to participate in CR to modify risk factors and increase stamina and strength  Patient will continue to modify risk factors and increase stamina and strength.  Nashya plans to work out on her treadmill at home for home exercise.        Exercise Goals and Review: Exercise Goals    Row Name 03/30/19 1540             Exercise Goals   Increase Physical Activity  Yes       Intervention  Provide advice, education, support and counseling about physical activity/exercise needs.;Develop an individualized exercise prescription for aerobic and resistive training based on initial evaluation findings, risk stratification, comorbidities and participant's personal goals.       Expected Outcomes  Short Term: Attend rehab on a regular basis to increase amount of physical activity.;Long Term: Add in home exercise to make exercise part of routine and to increase amount of physical activity.;Long Term: Exercising regularly at least 3-5 days a week.       Increase Strength and Stamina  Yes       Intervention  Provide advice, education, support and counseling about physical activity/exercise needs.;Develop an individualized exercise prescription for aerobic and resistive training based on initial evaluation findings, risk stratification, comorbidities and participant's personal goals.       Expected Outcomes  Short Term: Increase workloads from initial exercise prescription for resistance, speed, and METs.;Short Term: Perform resistance training exercises routinely during rehab and add in resistance training at home;Long Term: Improve cardiorespiratory fitness, muscular endurance and strength as measured by increased METs and functional capacity (6MWT)       Able to understand and use rate of perceived exertion (RPE) scale  Yes       Intervention  Provide education and explanation on how to use RPE scale       Expected Outcomes  Short Term: Able to use  RPE daily in rehab to express subjective intensity level;Long Term:  Able to use RPE to guide intensity level when exercising independently       Able to understand and use Dyspnea scale  Yes       Intervention  Provide education and explanation on how to use Dyspnea scale       Expected Outcomes  Short Term: Able to use Dyspnea scale daily in rehab to express subjective sense of shortness of breath during exertion;Long Term: Able to use Dyspnea scale to guide intensity level when exercising independently       Knowledge and understanding of Target Heart Rate Range (THRR)  Yes       Intervention  Provide education and explanation of THRR including how the numbers were predicted and where they are located for reference       Expected Outcomes  Short Term: Able to state/look up THRR;Long Term: Able to use THRR to govern intensity when exercising independently;Short Term: Able  to use daily as guideline for intensity in rehab       Able to check pulse independently  Yes       Intervention  Provide education and demonstration on how to check pulse in carotid and radial arteries.;Review the importance of being able to check your own pulse for safety during independent exercise       Expected Outcomes  Short Term: Able to explain why pulse checking is important during independent exercise;Long Term: Able to check pulse independently and accurately       Understanding of Exercise Prescription  Yes       Intervention  Provide education, explanation, and written materials on patient's individual exercise prescription       Expected Outcomes  Short Term: Able to explain program exercise prescription;Long Term: Able to explain home exercise prescription to exercise independently          Exercise Goals Re-Evaluation: Exercise Goals Re-Evaluation    Row Name 04/03/19 1508 04/21/19 1538 05/03/19 1330 05/26/19 1548       Exercise Goal Re-Evaluation   Exercise Goals Review  Understanding of Exercise  Prescription;Increase Physical Activity;Able to understand and use Dyspnea scale;Increase Strength and Stamina;Knowledge and understanding of Target Heart Rate Range (THRR);Able to understand and use rate of perceived exertion (RPE) scale  Increase Physical Activity;Increase Strength and Stamina;Able to understand and use rate of perceived exertion (RPE) scale;Knowledge and understanding of Target Heart Rate Range (THRR);Able to check pulse independently;Understanding of Exercise Prescription  Increase Physical Activity;Increase Strength and Stamina;Able to understand and use rate of perceived exertion (RPE) scale;Knowledge and understanding of Target Heart Rate Range (THRR);Able to check pulse independently;Understanding of Exercise Prescription  Increase Physical Activity;Increase Strength and Stamina;Able to understand and use rate of perceived exertion (RPE) scale;Knowledge and understanding of Target Heart Rate Range (THRR);Able to check pulse independently;Understanding of Exercise Prescription    Comments  Pt first day of CR program. Pt tolerated exercise well. Pt used 2L of O2 during exercise and O2 levels were 98% with exercise. Pt understands RPE scale, THRR, Dyspnea scale, and exercise Rx.  Reviewed HEP with Pt. Pt understands home exercise goals, THRR, RPE scale, weather precautions, end points of exercise, pulse counting, warm up and cool down stretches. Pt is currently walking at home 2-4 days per week for 30-45 minutes in addition to CR program.  Reviewed METs and goals with Pt. Pt is progressing well and has a MET level of 2.4. Pt has progressed to walking on a treadmill. Pt stated she is also using a treadmill at home 2-4 days per week for 20-30 minutes in addition to CR program.  Pt graduated CR prgoram today. Pt progressed well in the program and ended with a MET level of 2.8. Pt increased workloads and MET level gradually. Pt plans to continue to walk on her treadmaill 5-7 days per week 30-45  minutes per day. Pt was reminded to wear O2 while exerting herself due to O2 levels decreasing with exertion.    Expected Outcomes  Will continue to monitor and progress Pt as tolerated.  Will continue to monitor and progress Pt as tolerated.  Will continue to monitor and progress Pt as tolerated.  Pt will exercise at home using a treadmill.       Nutrition & Weight - Outcomes: Pre Biometrics - 03/30/19 1540      Pre Biometrics   Height  _0  (1.626 m)    Weight  52.8 kg    Waist Circumference  31 inches    Hip Circumference  36 inches    Waist to Hip Ratio  0.86 %    BMI (Calculated)  19.97    Triceps Skinfold  20 mm    % Body Fat  32.5 %    Grip Strength  29 kg    Flexibility  16.5 in    Single Leg Stand  30 seconds      Post Biometrics - 05/24/19 1518       Post  Biometrics   Height  _0  (1.626 m)    Weight  57 kg    Waist Circumference  31 inches    Hip Circumference  36 inches    Waist to Hip Ratio  0.86 %    BMI (Calculated)  21.56    Triceps Skinfold  20 mm    % Body Fat  33.3 %    Grip Strength  29.5 kg    Flexibility  16.5 in    Single Leg Stand  12 seconds       Nutrition:   Nutrition Discharge:   Education Questionnaire Score: Knowledge Questionnaire Score - 05/24/19 1508      Knowledge Questionnaire Score   Post Score  23/24       Goals reviewed with patient; copy given to patient.

## 2019-06-18 DIAGNOSIS — J449 Chronic obstructive pulmonary disease, unspecified: Secondary | ICD-10-CM | POA: Diagnosis not present

## 2019-06-27 ENCOUNTER — Other Ambulatory Visit: Payer: Self-pay | Admitting: Cardiothoracic Surgery

## 2019-06-27 DIAGNOSIS — Z952 Presence of prosthetic heart valve: Secondary | ICD-10-CM

## 2019-06-28 ENCOUNTER — Telehealth: Payer: Medicare Other | Admitting: Cardiothoracic Surgery

## 2019-06-28 ENCOUNTER — Other Ambulatory Visit: Payer: Self-pay

## 2019-06-28 ENCOUNTER — Telehealth: Payer: Self-pay | Admitting: Cardiothoracic Surgery

## 2019-06-28 NOTE — Telephone Encounter (Signed)
[address]       [place] [address]             [phone]     CARDIOTHORACIC SURGERY TELEPHONE VIRTUAL OFFICE NOTE  Referring Provider is No ref. provider found Primary Cardiologist is Quay Burow, MD PCP is Deland Pretty, MD   HPI:  I spoke with Dana Pruitt (DOB 12/16/1940 ) via telephone on 06/28/2019 at 11:15 AM and verified that I was speaking with the correct person using more than one form of identification.  We discussed the reason(s) for conducting our visit virtually instead of in-person.  The patient expressed understanding the circumstances and agreed to proceed as described.  Final 10-month follow-up after AVR CABG for severe AI.  Patient is doing well without symptoms of angina or CHF.  She has been weaned off her home oxygen and no longer needs it.  She is actively followed by Dr. Melvyn Novas.  Her cardiologist Dr. Quay Burow performed an echocardiogram 3 months postop showing normal function of the bioprosthetic valve, sinus rhythm, good LV function.  Patient has no symptoms of Covid 19 infection-productive cough fever myalgias.  She has not yet had a vaccine.  Current Outpatient Medications  Medication Sig Dispense Refill  . amoxicillin (AMOXIL) 500 MG tablet TAKE 4 TABLETS 1 HOUR BEFORE DENTAL EXAMINATION (Patient taking differently: as needed. TAKE 4 TABLETS 1 HOUR BEFORE DENTAL EXAMINATION) 4 tablet 0  . aspirin EC 81 MG tablet Take 81 mg by mouth daily.    [name] atorvastatin (LIPITOR) 20 MG tablet Take 20 mg by mouth daily.     . carvedilol (COREG) 12.5 MG tablet Take 1 tablet (12.5 mg total) by mouth 2 (two) times daily with a meal. 180 tablet 1  . cholecalciferol (VITAMIN D3) 25 MCG (1000 UT) tablet Take 1,000 Units by mouth daily.    [name] denosumab (PROLIA) 60 MG/ML SOSY injection Inject 60 mg into the skin every 6 (six) months.    [name] guaiFENesin (MUCINEX) 600 MG 12 hr tablet Take 1 tablet (600 mg total) by mouth 2 (two) times daily. 30 tablet  0  . sertraline (ZOLOFT) 100 MG tablet Take 100 mg by mouth 2 (two) times a day.     . telmisartan (MICARDIS) 80 MG tablet Take 1 tablet (80 mg total) by mouth daily. 30 tablet 11  . Tiotropium Bromide-Olodaterol (STIOLTO RESPIMAT) 2.5-2.5 MCG/ACT AERS Inhale 2 puffs into the lungs daily. 4 g 11  . vitamin B-12 (CYANOCOBALAMIN) 500 MCG tablet Take 500 mcg by mouth daily.     No current facility-administered medications for this visit.   Facility-Administered Medications Ordered in Other Visits  Medication Dose Route Frequency Provider Last Rate Last Admin  . sodium chloride flush (NS) 0.9 % injection 3 mL  3 mL Intravenous Q12H Larey Dresser, MD         Diagnostic Tests: No recent chest x-rays available.  Last chest x-ray is clear     Impression:  Patient has made a good recovery after AVR with a 21 mm bioprosthetic pericardial valve. She understands importance of antibiotic prophylaxis prior to any dental procedures. No further surgical follow-up needed. Plan:  We will call her home health agency to remove the oxygen equipment. We will be available as needed.    I discussed limitations of evaluation and management via telephone.  The patient was advised to call back for repeat telephone consultation or to seek an in-person evaluation if questions arise  or the patient's clinical condition changes in any significant manner.  I spent in excess of 8 minutes of non-face-to-face time during the conduct of this telephone virtual office consultation.  Level 1  (99441)             5-10 minutes Level 2  (99442)            11-20 minutes Level 3  (99443)            21-30 minutes    06/28/2019 11:15 AM

## 2019-07-05 DIAGNOSIS — J018 Other acute sinusitis: Secondary | ICD-10-CM | POA: Diagnosis not present

## 2019-07-11 ENCOUNTER — Ambulatory Visit: Payer: Medicare Other | Attending: Internal Medicine

## 2019-07-11 DIAGNOSIS — Z23 Encounter for immunization: Secondary | ICD-10-CM

## 2019-07-11 NOTE — Progress Notes (Signed)
   Covid-19 Vaccination Clinic  Name:  Dana Pruitt    MRN: [id number] DOB: 09/04/1940  07/11/2019  [name] was observed post Covid-19 immunization for 15 minutes without incidence. She was provided with Vaccine Information Sheet and instruction to access the V-Safe system.   [name] was instructed to call 911 with any severe reactions post vaccine: [name] Difficulty breathing  . Swelling of your face and throat  . A fast heartbeat  . A bad rash all over your body  . Dizziness and weakness    Immunizations Administered    Name Date Dose VIS Date Route   Pfizer COVID-19 Vaccine 07/11/2019  4:08 PM 0.3 mL 06/02/2019 Intramuscular   Manufacturer: [place]   Lot: [id number]   [place]: [id number]

## 2019-07-21 ENCOUNTER — Ambulatory Visit: Payer: Medicare Other

## 2019-07-31 ENCOUNTER — Ambulatory Visit: Payer: Medicare Other | Attending: Internal Medicine

## 2019-07-31 DIAGNOSIS — Z23 Encounter for immunization: Secondary | ICD-10-CM | POA: Insufficient documentation

## 2019-07-31 NOTE — Progress Notes (Signed)
   Covid-19 Vaccination Clinic  Name:  Dana Pruitt    MRN: [id number] DOB: 1941/04/17  07/31/2019  [name] was observed post Covid-19 immunization for 15 minutes without incidence. She was provided with Vaccine Information Sheet and instruction to access the V-Safe system.   [name] was instructed to call 911 with any severe reactions post vaccine: [name] Difficulty breathing  . Swelling of your face and throat  . A fast heartbeat  . A bad rash all over your body  . Dizziness and weakness    Immunizations Administered    Name Date Dose VIS Date Route   Pfizer COVID-19 Vaccine 07/31/2019 10:14 AM 0.3 mL 06/02/2019 Intramuscular   Manufacturer: [place]   Lot: [id number]   [place]: [id number]

## 2019-08-29 ENCOUNTER — Ambulatory Visit: Payer: Medicare Other | Admitting: Internal Medicine

## 2019-08-29 ENCOUNTER — Other Ambulatory Visit: Payer: Self-pay

## 2019-08-29 ENCOUNTER — Encounter: Payer: Self-pay | Admitting: Internal Medicine

## 2019-08-29 DIAGNOSIS — M62838 Other muscle spasm: Secondary | ICD-10-CM | POA: Diagnosis not present

## 2019-08-29 DIAGNOSIS — J449 Chronic obstructive pulmonary disease, unspecified: Secondary | ICD-10-CM | POA: Diagnosis not present

## 2019-08-29 MED ORDER — ALBUTEROL SULFATE HFA 108 (90 BASE) MCG/ACT IN AERS: INHALATION_SPRAY | RESPIRATORY_TRACT | 1 refills | Status: DC

## 2019-08-29 NOTE — Progress Notes (Signed)
Dana Pruitt, female    DOB: 1940-07-07     MRN: [id number]   Brief patient profile:   84 yowf MM/quit smoking 1992 previously followed by Dr Dana Pruitt with  GOLD II copd    PFT 01/04/17: FVC 2.17 L (79%) FEV1 1.32 L (64%) FEV1/FVC 0.61 FEF 25-75 0.57 L (34%) negative bronchodilator response TLC 5.23 L (105%) RV 136% ERV 39% DLCO corrected 43% 12/30/12: FVC 2.77 L (101%) FEV1 1.92 L (89%) FEV1/FVC 0.69 FEF 25-75 1.13 L (51%) negative bronchodilator response TLC 4.77 L (97%) RV 110% DLCO uncorrected 51%  6MWT 03/08/17:  Walked 414 meters / Baseline Sat 96% on RA / Nadir Sat 94% on RA (stopped with 4:37 due to poor pulse ox signal)    CARDIAC TTE (07/14/16):  LV normal in size with EF 60-65% & no regional wall motion abnormalities. Grade 1 diastolic dysfunction. LA & RA normal in size. RV mildly dilated with reduced systolic function. Moderate aortic regurgitation without stenosis. Ascending aorta is mildly dilated with aortic root normal in size. No mitral stenosis or regurgitation. Mild pulmonic regurgitation. No significant tricuspid regurgitation. No pericardial effusion.    Admit date: 01/31/2019 Discharge date: 02/10/2019  Admission Diagnoses: Severe aortic insufficiency  Discharge Diagnoses:  Active Problems:   Aortic valve disease    hx The patient is a 79 year old female referred to Dr. Harl Pruitt right for evaluation of severe aortic insufficiency.  She has been seen by her cardiologist for several years with serial echocardiogram.  Her most recent echo revealed severe AI with some LV dilatation and reduced LV systolic function.  She underwent right and left heart catheterization and was found to have no significant coronary artery disease.  Right heart catheterization showed normal pressures with mixed venous saturations of 68%.  LVEDP was normal and cardiac index was 2.9.  Pulmonary function studies were performed due to a history of smoking heavy smoking in the past,  although really remote.  Her FEV1 was 1.2 and DLCO was 40%.  A CT scan showed the ascending aorta measuring 4.0 cm.  There was findings of panlobular emphysema noted on the CT scan.  Dr. Darcey Pruitt evaluated the patient and all her studies and recommended proceeding with elective aortic valve replacement and she was admitted this hospitalization for the procedure.   Hospital Course: The patient was admitted electively and on 01/31/2019 taken to the operating room where she underwent Aortic valve replacement with a 21 mm Inspiris Edwards valve..  She tolerated it well and was taken to the surgical intensive care unit in stable condition.  Postoperative hospital course:  The patient has done well postoperatively.  She has remained neurologically intact.  She was weaned from the ventilator using standard protocols without difficulty.  She initially required some inotropic support with dobutamine but this was able to be weaned without difficulty.  She was also started on Midodrine to assist with blood pressure.  She is not felt to be a candidate at this time for ACE inhibitor or ARB.  She does have some postoperative volume overload and is responding to diuretics.  She has had postoperative atrial fibrillation and has been started on an amiodarone drip per usual protocol.  She was transitioned to p.o. amiodarone and subsequently was chemically cardioverted to sinus rhythm with only occasional runs of afib. Metoprolol was uptitrated and additionally she was started on Coumadin.  At the time of discharge the patient was very stable.  Home health arrangements were also made to have INR  checked.  Additionally she did require home oxygen to be arranged with lower sats with ambulation in the qualifying range.  She has known severe COPD.      History of Present Illness  04/19/2019  Pulmonary/ 1st office eval/Dana Pruitt  Chief Complaint  Patient presents with  . New Patient (Initial Visit)    Seen in the past by  Dr Dana Pruitt. She states she has been doing cardiac rehab at [facility] and having issues with DOE and low o2 sats.   Dyspnea:  Was walking in hallways prior to d/c with sats 87% so rec 2lpm sleeping and walking but did not  Use it that way and in rehab same problem so started on 2lpm but has been  weaned to 1pm  At home does dog walking up and down hills,  Finds talking and walking are tough/ has not noticed hoarseness  Cough: no Sleep: flat  SABA use: none  - has not helped. rec Try off lisinopril for now  Start Micardis 80 mg one daily x 4 weeks  Make sure you check your oxygen saturations at highest level of activity to be sure it stays over 90% and adjust upward to maintain this level if needed but remember to turn it back to previous settings when you stop (to conserve your supply).         05/31/2019  f/u ov/Dana Pruitt re: GOLD II/ ex hypoxemia  Chief Complaint  Patient presents with  . Follow-up    Here to walk with POC to see how she tolerates.    Dyspnea:  Goal is to be able to do gxt x 2.3 mph x 30 min but uses 2lpm do to this at home does not check sats on or off 02 and uses a large tank for this purpose  Cough: minimal/ non prod Sleeping: bed flat, on side  SABA use: none  02: just with exertion rec Stiolto is 2 pffs each am x 15 days on a trial basis - go ahead and fill the prescription if you like it  Work on inhaler technique:  Make sure you check your oxygen saturations at highest level of activity to be sure it stays over 90% and adjust upward to maintain this level if needed but remember to turn it back to previous settings when you stop (to conserve your supply).    08/29/2019  f/u ov/Dana Pruitt re:  GOLD II/ ex hypoxemia / both covid shots last rx 3 weeks prior to [place] Complaint  Patient presents with  . Follow-up    Breathing is much improved since the last visit. She does not have a rescue inhaler and would like to discuss getting one.   Dyspnea:  Does gxt  75mph x 30 min no  longer any 02 / no elevation  Cough: minimal dry Sleeping: flat/ one pillow  SABA use: n/a 02: turned it back in    No obvious day to day or daytime variability or assoc excess/ purulent sputum or mucus plugs or hemoptysis or cp or chest tightness, subjective wheeze or overt sinus or hb symptoms.   Sleeping  without nocturnal  or early am exacerbation  of respiratory  c/o's or need for noct saba. Also denies any obvious fluctuation of symptoms with weather or environmental changes or other aggravating or alleviating factors except as outlined above   No unusual exposure hx or h/o childhood pna/ asthma or knowledge of premature birth.  Current Allergies, Complete Past Medical History, Past Surgical  History, Family History, and Social History were reviewed in [employer] record.  ROS  The following are not active complaints unless bolded Hoarseness, sore throat, dysphagia, dental problems, itching, sneezing,  nasal congestion or discharge of excess mucus or purulent secretions, ear ache,   fever, chills, sweats, unintended wt loss or wt gain, classically pleuritic or exertional cp,  orthopnea pnd or arm/hand swelling  or leg swelling, presyncope, palpitations, abdominal pain, anorexia, nausea, vomiting, diarrhea  or change in bowel habits or change in bladder habits, change in stools or change in urine, dysuria, hematuria,  rash, arthralgias= neck aches R / positional s radicular features, visual complaints, headache, numbness, weakness or ataxia or problems with walking or coordination,  change in mood or  memory.        Current Meds  Medication Sig  . aspirin EC 81 MG tablet Take 81 mg by mouth daily.  [name] atorvastatin (LIPITOR) 20 MG tablet Take 20 mg by mouth daily.   . carvedilol (COREG) 12.5 MG tablet Take 1 tablet (12.5 mg total) by mouth 2 (two) times daily with a meal.  . cholecalciferol (VITAMIN D3) 25 MCG (1000 UT) tablet Take 1,000 Units by mouth daily.  [name]  denosumab (PROLIA) 60 MG/ML SOSY injection Inject 60 mg into the skin every 6 (six) months.  [name] guaiFENesin (MUCINEX) 600 MG 12 hr tablet Take 1 tablet (600 mg total) by mouth 2 (two) times daily.  . sertraline (ZOLOFT) 100 MG tablet Take 100 mg by mouth 2 (two) times a day.   . telmisartan (MICARDIS) 80 MG tablet Take 1 tablet (80 mg total) by mouth daily.  . Tiotropium Bromide-Olodaterol (STIOLTO RESPIMAT) 2.5-2.5 MCG/ACT AERS Inhale 2 puffs into the lungs daily.  . vitamin B-12 (CYANOCOBALAMIN) 500 MCG tablet Take 500 mcg by mouth daily.                 Past Medical History:  Diagnosis Date  . Anemia    "several times over the years" (10/17/2012)  . Anxiety   . Breast cancer ([place]) 02/2010   right breast  . Carcinoma of breast treated with adjuvant hormone therapy ([place]) 9/11   [place]  . CHF (congestive heart failure) ([place])   . COPD (chronic obstructive pulmonary disease) ([place])   . Depression   . Exertional shortness of breath    "recently" (10/17/2012)  . GERD (gastroesophageal reflux disease)    diet controlled, no med  . Hollenhorst plaque, left eye    apparently resolved at recent ophthalmology visit  . Hyperlipidemia   . Hypertension   . 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 Left ventricular dysfunction   . Moderate aortic insufficiency   . Pneumonia    "that's why I'm here" (10/17/2012)       Objective:     amb wf nad   08/29/2019          126  05/31/19 125 lb (56.7 kg)  05/24/19 125 lb 10.6 oz (57 kg)  04/26/19 122 lb 9.6 oz (55.6 kg)      Vital signs reviewed  08/29/2019  - Note at rest 02 sats  94% on RA      HEENT : pt wearing mask not removed for exam due to covid - 19 concerns.   NECK :  without JVD/Nodes/TM/ nl carotid upstrokes bilaterally   LUNGS: no acc muscle use,  Min barrel  contour chest wall with bilateral  slightly decreased bs s audible wheeze and  without cough on insp or exp maneuvers and min  Hyperresonant  to  percussion  bilaterally     CV:  RRR  no s3 or murmur or increase in P2, and no edema   ABD:  soft and nontender with pos end  insp Hoover's  in the supine position. No bruits or organomegaly appreciated, bowel sounds nl  MS:   Nl gait/  ext warm without deformities, calf tenderness, cyanosis or clubbing No obvious joint restrictions   SKIN: warm and dry without lesions    NEURO:  alert, approp, nl sensorium with  no motor or cerebellar deficits apparent.               Assessment

## 2019-08-29 NOTE — Patient Instructions (Addendum)
Plan A = Automatic = Always=    Stiolto 2 pffs each am   Plan B = Backup (to supplement plan A, not to replace it) Only use your albuterol inhaler as a rescue medication to be used if you can't catch your breath by resting or doing a relaxed purse lip breathing pattern.  - The less you use it, the better it will work when you need it. - Ok to use the inhaler up to 2 puffs  every 4 hours if you must but call for appointment if use goes up over your usual need - Don't leave home without it !!  (think of it like the spare tire for your car)    Please schedule a follow up visit in 12 months but call sooner if needed

## 2019-08-30 ENCOUNTER — Encounter: Payer: Self-pay | Admitting: Internal Medicine

## 2019-08-30 DIAGNOSIS — M62838 Other muscle spasm: Secondary | ICD-10-CM | POA: Insufficient documentation

## 2019-08-30 NOTE — Assessment & Plan Note (Addendum)
Quit smoking 1992  01/04/17: FVC 2.17 L (79%) FEV1 1.32 L (64%) FEV1/FVC 0.61 FEF 25-75 0.57 L (34%) negative bronchodilator response TLC 5.23 L (105%) RV 136% ERV 39% DLCO corrected 43% - alpha one AT  03/08/17   MM  Level 164  - PFT's  01/05/2019  FEV1 1.30 (64 % ) ratio 0.62  p 6 % improvement from saba p ? prior to study with DLCO  7.27 (39%) corrects to 2.15 (52%)  for alv volume and FV curve classic concave pattern - 05/31/2019  After extensive coaching inhaler device,  effectiveness =    75% (delayed insp) with SMI > try stiolto 2 puffs each am > improved  Pt is Group B in terms of symptom/risk and laba/lama therefore appropriate rx at this point >>>  Doing well on stiolto /provided also with saba prn .  - The proper method of use, as well as anticipated side effects, of a metered-dose inhaler are discussed and demonstrated to the patient and explained how / when to use hfa saba and how it is different from lama/laba per [facility] inhaler   F/u can be yearly           Each maintenance medication was reviewed in detail including emphasizing most importantly the difference between maintenance and prns and under what circumstances the prns are to be triggered using an action plan format where appropriate.  Total time for H and P, chart review, counseling, teaching device and generating customized AVS unique to this office visit / charting = 30 min

## 2019-08-30 NOTE — Assessment & Plan Note (Signed)
Onset 2021 > advised neck stretching and f/u with PCP or ortho   No radicular features or suggestion of any referred angina clinically.

## 2019-09-05 ENCOUNTER — Other Ambulatory Visit: Payer: Self-pay

## 2019-09-05 DIAGNOSIS — Z792 Long term (current) use of antibiotics: Secondary | ICD-10-CM

## 2019-09-05 MED ORDER — AMOXICILLIN 500 MG PO TABS: ORAL_TABLET | ORAL | 2 refills | Status: DC

## 2019-09-14 IMAGING — CR CHEST - 2 VIEW
2 series · 2 of 2 positions shown · non-contrast
Comparison: 12/16/2018

CLINICAL DATA: Preoperative respiratory exam for aortic valve
replacement.

EXAM:
CHEST - 2 VIEW

[w chest pa]
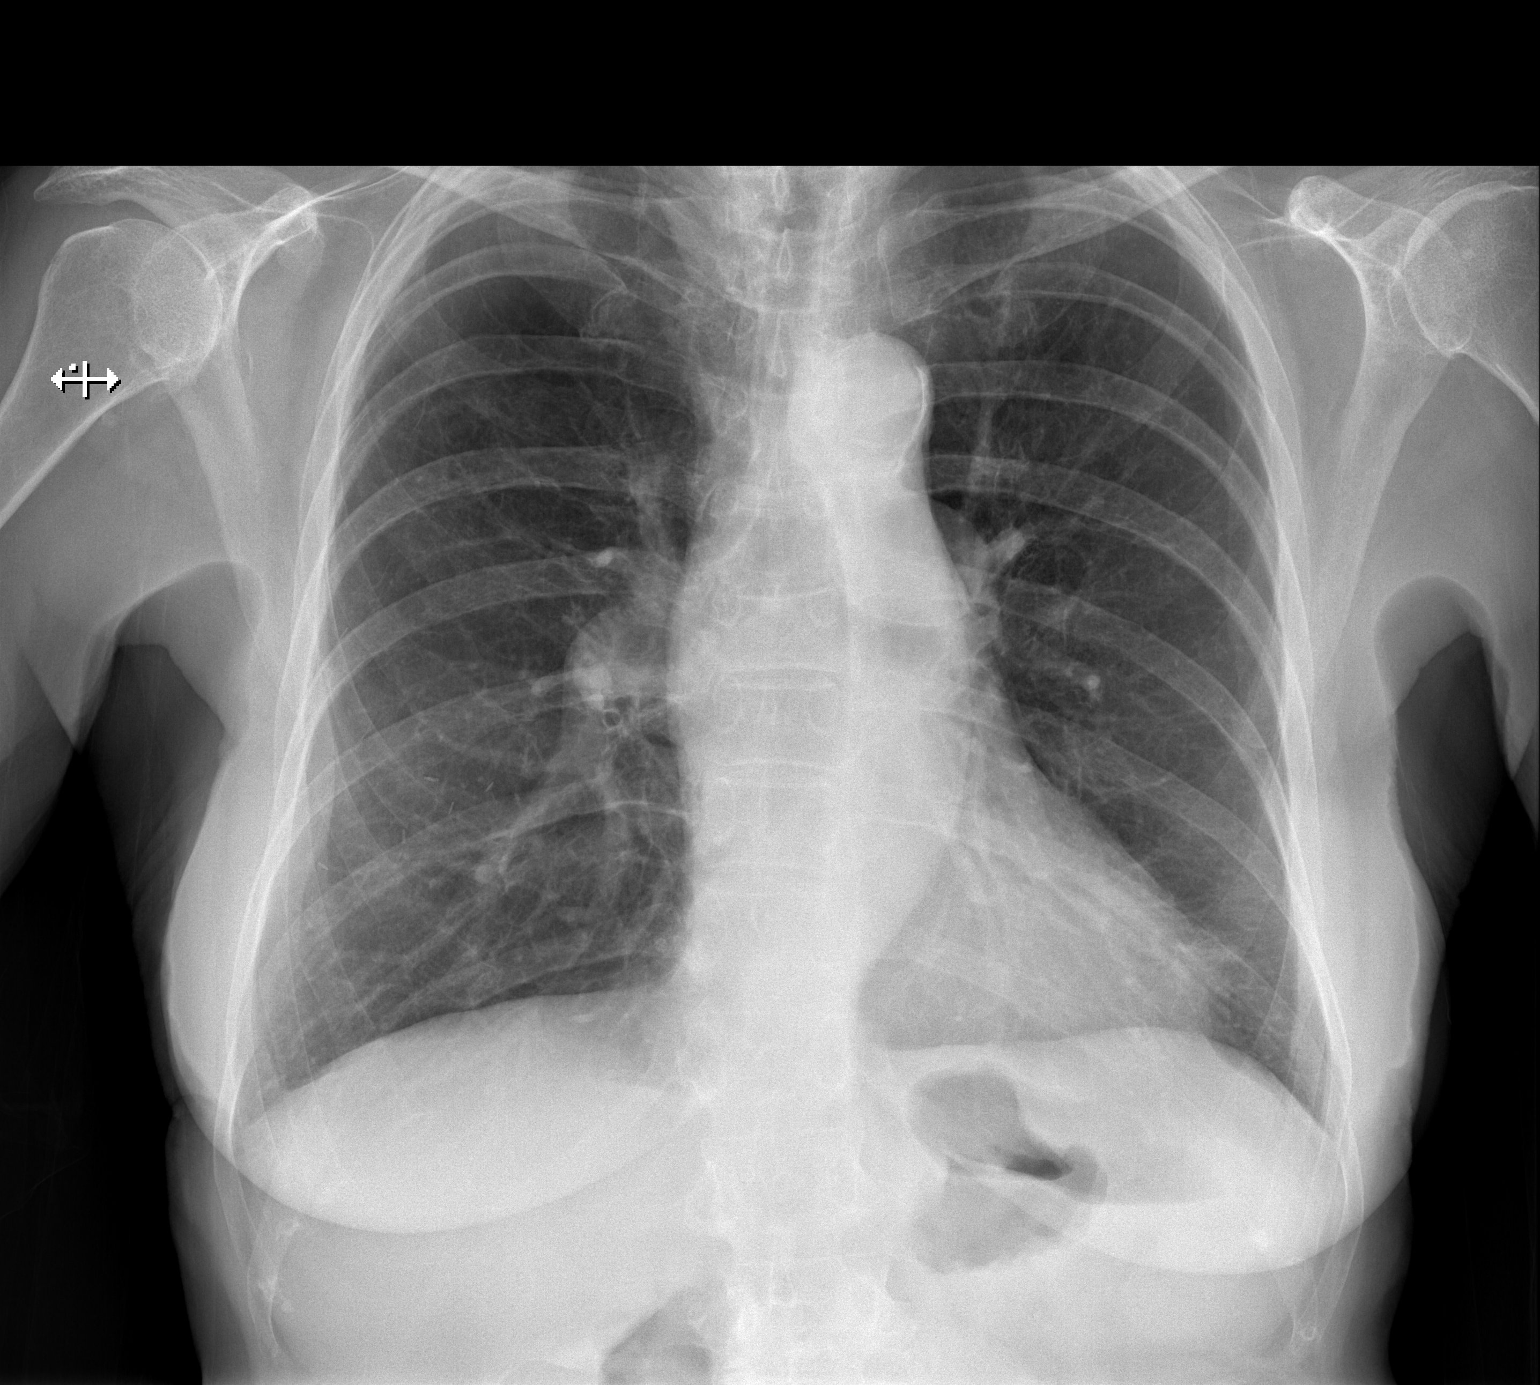

[w chest lat]
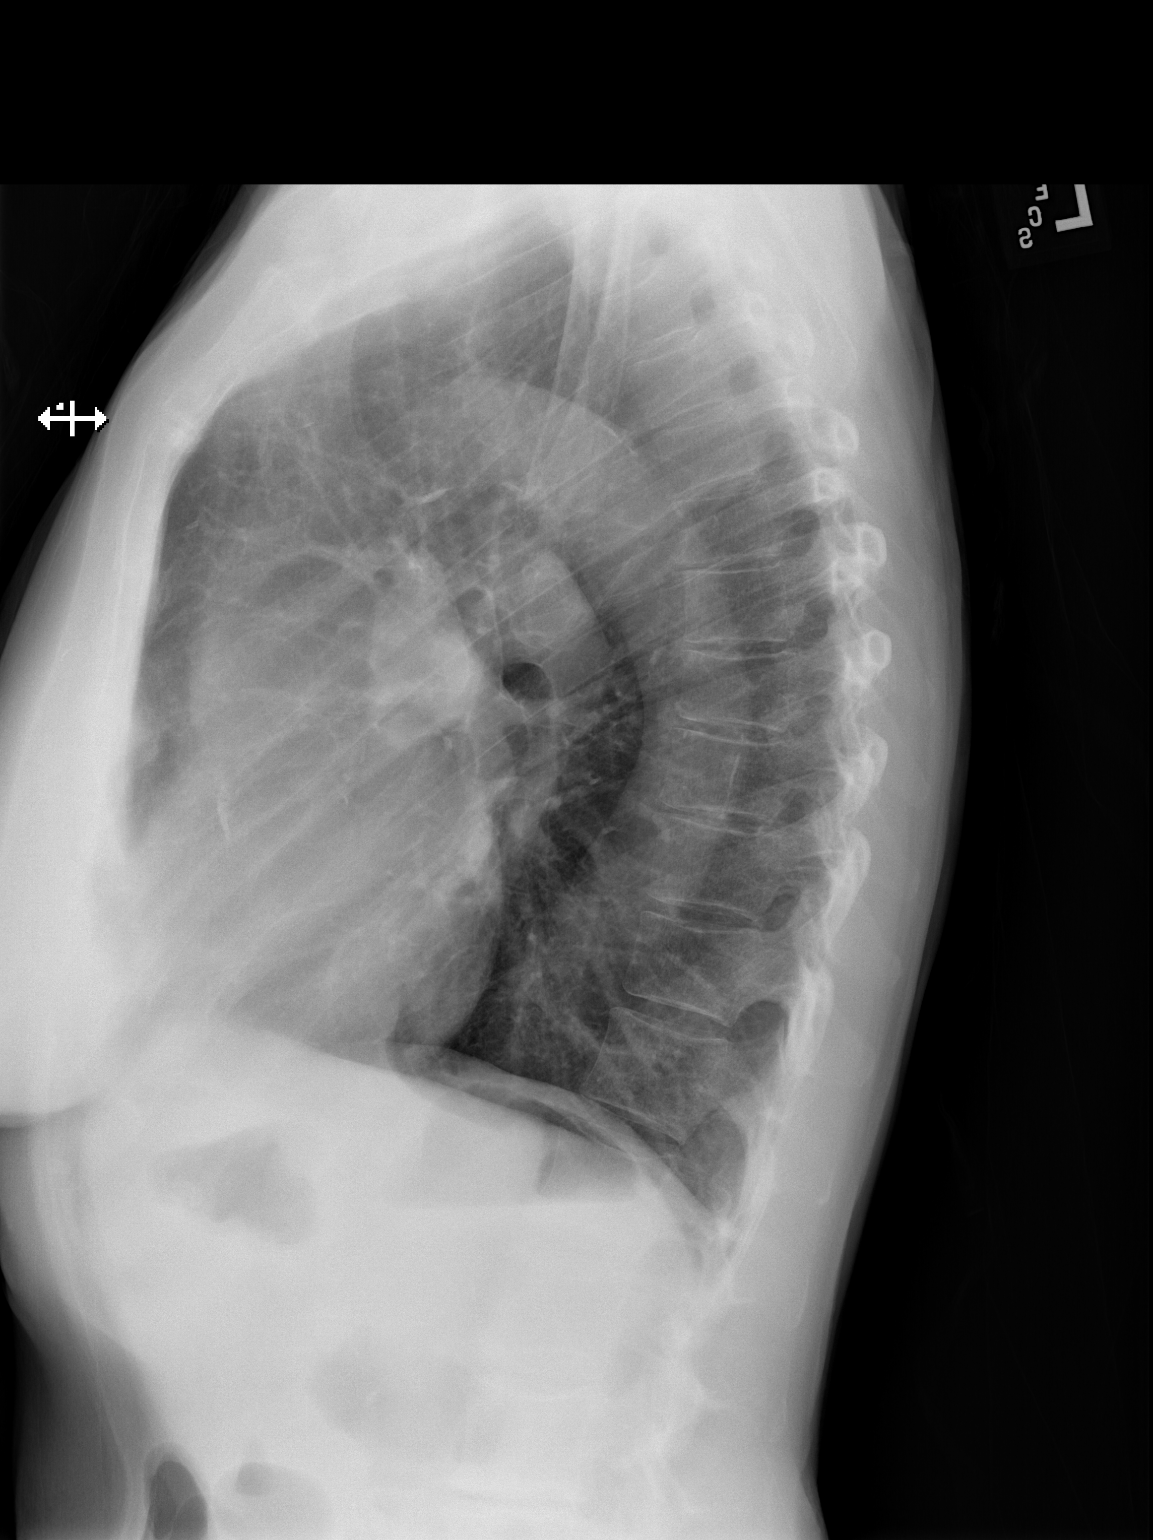

[2 of 2 positions shown; findings below may reference images not displayed]

FINDINGS: The heart size is normal. Stable mild tortuosity of the thoracic
aorta. There is no evidence of pulmonary edema, consolidation,
pneumothorax, nodule or pleural fluid. Visualized bony structures
are unremarkable.
IMPRESSION: No active cardiopulmonary disease.

## 2019-09-27 DIAGNOSIS — M810 Age-related osteoporosis without current pathological fracture: Secondary | ICD-10-CM | POA: Diagnosis not present

## 2019-10-05 DIAGNOSIS — Z1231 Encounter for screening mammogram for malignant neoplasm of breast: Secondary | ICD-10-CM | POA: Diagnosis not present

## 2019-10-11 ENCOUNTER — Other Ambulatory Visit: Payer: Self-pay

## 2019-10-11 ENCOUNTER — Ambulatory Visit (HOSPITAL_COMMUNITY): Payer: Medicare Other | Attending: Cardiology

## 2019-10-11 DIAGNOSIS — I351 Nonrheumatic aortic (valve) insufficiency: Secondary | ICD-10-CM | POA: Diagnosis not present

## 2019-10-12 ENCOUNTER — Other Ambulatory Visit: Payer: Self-pay | Admitting: Internal Medicine

## 2019-10-12 MED ORDER — ALBUTEROL SULFATE HFA 108 (90 BASE) MCG/ACT IN AERS: INHALATION_SPRAY | RESPIRATORY_TRACT | 1 refills | Status: DC

## 2019-10-13 ENCOUNTER — Other Ambulatory Visit: Payer: Self-pay

## 2019-10-13 DIAGNOSIS — I359 Nonrheumatic aortic valve disorder, unspecified: Secondary | ICD-10-CM

## 2019-10-13 DIAGNOSIS — I351 Nonrheumatic aortic (valve) insufficiency: Secondary | ICD-10-CM

## 2019-10-13 DIAGNOSIS — Z952 Presence of prosthetic heart valve: Secondary | ICD-10-CM

## 2019-10-16 ENCOUNTER — Telehealth: Payer: Self-pay | Admitting: Cardiovascular Disease

## 2019-10-16 NOTE — Telephone Encounter (Signed)
New message   Patient states that she would like to speak to Dr. [name] about the results of the echo. Please call.

## 2019-10-16 NOTE — Telephone Encounter (Signed)
The patient has been given the results and they were discussed.   Echo results:  Nl LV systolic FXN with a well FXN Ao bioprosthesis. Repeat 12 months

## 2019-10-23 ENCOUNTER — Telehealth: Payer: Self-pay

## 2019-10-23 ENCOUNTER — Encounter: Payer: Self-pay | Admitting: Physician Assistant

## 2019-10-23 ENCOUNTER — Telehealth (INDEPENDENT_AMBULATORY_CARE_PROVIDER_SITE_OTHER): Payer: Medicare Other | Admitting: Physician Assistant

## 2019-10-23 VITALS — Ht 64.0 in | Wt 122.0 lb

## 2019-10-23 DIAGNOSIS — I480 Paroxysmal atrial fibrillation: Secondary | ICD-10-CM | POA: Diagnosis not present

## 2019-10-23 DIAGNOSIS — I10 Essential (primary) hypertension: Secondary | ICD-10-CM

## 2019-10-23 DIAGNOSIS — Z952 Presence of prosthetic heart valve: Secondary | ICD-10-CM

## 2019-10-23 DIAGNOSIS — I2510 Atherosclerotic heart disease of native coronary artery without angina pectoris: Secondary | ICD-10-CM | POA: Diagnosis not present

## 2019-10-23 DIAGNOSIS — R42 Dizziness and giddiness: Secondary | ICD-10-CM

## 2019-10-23 DIAGNOSIS — I428 Other cardiomyopathies: Secondary | ICD-10-CM

## 2019-10-23 DIAGNOSIS — E785 Hyperlipidemia, unspecified: Secondary | ICD-10-CM

## 2019-10-23 MED ORDER — TELMISARTAN 40 MG PO TABS: 40.0000 mg | ORAL_TABLET | Freq: Every day | ORAL | 11 refills | Status: DC

## 2019-10-23 NOTE — Patient Instructions (Addendum)
Medication Instructions:   DECREASE Telmisartan to 40 mg daily  *If you need a refill on your cardiac medications before your next appointment, please call your pharmacy*  Lab Work: NONE ordered at this time of appointment   If you have labs (blood work) drawn today and your tests are completely normal, you will receive your results only by: [name] MyChart Message (if you have MyChart) OR . A paper copy in the mail If you have any lab test that is abnormal or we need to change your treatment, we will call you to review the results.  Testing/Procedures: NONE ordered at this time of appointment   Follow-Up: At [facility], you and your health needs are our priority.  As part of our continuing mission to provide you with exceptional heart care, we have created designated Provider Care Teams.  These Care Teams include your primary Cardiologist (physician) and Advanced Practice Providers (APPs -  Physician Assistants and Nurse Practitioners) who all work together to provide you with the care you need, when you need it.  Your next appointment:   6 month(s)  The format for your next appointment:   In Person  Provider:   Quay Burow, MD  Other Instructions

## 2019-10-23 NOTE — Telephone Encounter (Signed)
  Patient Consent for Virtual Visit         Dana Pruitt has provided verbal consent on 10/23/2019 for a virtual visit (video or telephone).   CONSENT FOR VIRTUAL VISIT FOR:  Dana Pruitt  By participating in this virtual visit I agree to the following:  I hereby voluntarily request, consent and authorize [facility] and its employed or contracted physicians, physician assistants, nurse practitioners or other licensed health care professionals (the Practitioner), to provide me with telemedicine health care services (the "Services") as deemed necessary by the treating Practitioner. I acknowledge and consent to receive the Services by the Practitioner via telemedicine. I understand that the telemedicine visit will involve communicating with the Practitioner through live audiovisual communication technology and the disclosure of certain medical information by electronic transmission. I acknowledge that I have been given the opportunity to request an in-person assessment or other available alternative prior to the telemedicine visit and am voluntarily participating in the telemedicine visit.  I understand that I have the right to withhold or withdraw my consent to the use of telemedicine in the course of my care at any time, without affecting my right to future care or treatment, and that the Practitioner or I may terminate the telemedicine visit at any time. I understand that I have the right to inspect all information obtained and/or recorded in the course of the telemedicine visit and may receive copies of available information for a reasonable fee.  I understand that some of the potential risks of receiving the Services via telemedicine include:  [name] Delay or interruption in medical evaluation due to technological equipment failure or disruption; . Information transmitted may not be sufficient (e.g. poor resolution of images) to allow for appropriate medical decision making by the Practitioner;  and/or  . In rare instances, security protocols could fail, causing a breach of personal health information.  Furthermore, I acknowledge that it is my responsibility to provide information about my medical history, conditions and care that is complete and accurate to the best of my ability. I acknowledge that Practitioner's advice, recommendations, and/or decision may be based on factors not within their control, such as incomplete or inaccurate data provided by me or distortions of diagnostic images or specimens that may result from electronic transmissions. I understand that the practice of medicine is not an exact science and that Practitioner makes no warranties or guarantees regarding treatment outcomes. I acknowledge that a copy of this consent can be made available to me via my patient portal ([facility]), or I can request a printed copy by calling the office of [facility].    I understand that my insurance will be billed for this visit.   I have read or had this consent read to me. . I understand the contents of this consent, which adequately explains the benefits and risks of the Services being provided via telemedicine.  . I have been provided ample opportunity to ask questions regarding this consent and the Services and have had my questions answered to my satisfaction. . I give my informed consent for the services to be provided through the use of telemedicine in my medical care

## 2019-10-23 NOTE — Progress Notes (Addendum)
Virtual Visit via Telephone Note   This visit type was conducted due to national recommendations for restrictions regarding the COVID-19 Pandemic (e.g. social distancing) in an effort to limit this patient's exposure and mitigate transmission in our community.  Due to her co-morbid illnesses, this patient is at least at moderate risk for complications without adequate follow up.  This format is felt to be most appropriate for this patient at this time.  The patient did not have access to video technology/had technical difficulties with video requiring transitioning to audio format only (telephone).  All issues noted in this document were discussed and addressed.  No physical exam could be performed with this format.  Please refer to the patient's chart for her  consent to telehealth for [facility].   The patient was identified using 2 identifiers.  Date:  10/23/2019   ID:  Dana Pruitt, DOB 06-Jun-1941, MRN [id number]  Patient Location: Home Provider Location: Home  PCP:  Deland Pretty, MD  Cardiologist:  Quay Burow, MD  Electrophysiologist:  None   Evaluation Performed:  Follow-Up Visit  Chief Complaint:  6 month visit  History of Present Illness:    Dana Pruitt is a 79 y.o. female with past medical history of breast cancer, COPD, HTN, HLD, CAD, NICM with normalized EF, PAF and history of AVR.  She had breast cancer and lumpectomy with chemo and radiation therapy in 2013.  Her oncology is Dr. Jana Hakim.  She was referred to cardiology service in 2015 due to LV dysfunction noted on echocardiogram with EF of 35 to 40% and moderate aortic insufficiency.  Due to LV dysfunction, she was started on heart failure therapy including carvedilol.  Repeat echocardiogram in February 2016 revealed improvement in her ejection fraction up to 45-50%, she continue to moderate to severe AI.  Repeat echocardiogram in April 2020 shows EF was 40 to 45%. Coronary CT obtained on 12/17/2018 showed  coronary calcium score 442 which placed the patient had 35 percentile for age and sex matched control, normal coronary origin with right dominance, mild diffuse nonobstructive CAD, severely dilated pulmonary artery measuring at 45 mm consistent with pulmonary hypertension.  Patient was admitted with shortness of breath and chest pain in July 2020 and underwent left and right heart cath which revealed minimal CAD with severe AI.  She ultimately underwent bioprosthetic AVR by Dr. Prescott Gum with a 21 mm Inspiris Resilis bioprosthetic valve on 01/31/2019.  She remains on Coumadin therapy for preop atrial fibrillation which converted prior to discharge.  She was treated with amiodarone briefly and that this has been discontinued since.  Since atrial fibrillation occurred only during perioperative phase, Dr. [name] felt she can come off of Coumadin therapy as well.  Her last visit with Dr. [name] was in November 2020 at which time she was doing well.  Repeat echocardiogram obtained on 10/11/2019 showed EF 55 to 60%, stable bioprosthetic aortic valve with normal function.  Patient presents today for 50-monthfollow-up.  In the past 688-monthshe had a single brief episode of palpitation, this is very short-lived and has not recurred since.  She denies any recent chest pain or worsening shortness of breath.  Recent echocardiogram was quite reassuring.  Overall she has been doing quite well from cardiology perspective and can follow-up in 6 months.  She has had both vaccine shots recently.  The patient does not have symptoms concerning for COVID-19 infection (fever, chills, cough, or new shortness of breath).    Past Medical  History:  Diagnosis Date  . Anemia    "several times over the years" (10/17/2012)  . Anxiety   . Breast cancer ([place]) 02/2010   right breast  . Carcinoma of breast treated with adjuvant hormone therapy ([place]) 9/11   [facility]  . CHF (congestive heart failure) ([place])   . COPD (chronic obstructive  pulmonary disease) ([place])   . Depression   . Exertional shortness of breath    "recently" (10/17/2012)  . GERD (gastroesophageal reflux disease)    diet controlled, no med  . Hollenhorst plaque, left eye    apparently resolved at recent ophthalmology visit  . Hyperlipidemia   . Hypertension   . 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 Left ventricular dysfunction   . Moderate aortic insufficiency   . Pneumonia    "that's why I'm here" (10/17/2012)  . S/P AVR (aortic valve replacement) 04/26/2019   Past Surgical History:  Procedure Laterality Date  . AORTIC VALVE REPLACEMENT N/A 01/31/2019   Procedure: AORTIC VALVE REPLACEMENT (AVR) USING 21 MM INSPIRIS RESILIS AORTIC VALVE. SN: [id number];  Surgeon: Prescott Gum, Collier Salina, MD;  Location: [facility];  Service: Open Heart Surgery;  Laterality: N/A;  . APPENDECTOMY  1974  . BREAST BIOPSY Right 02/27/10   Needle core Biopsy; Invasive Mammary; ER/PR Positive, Her-2 Neu negative, Ki-67 22%  . BREAST LUMPECTOMY WITH SENTINEL LYMPH NODE BIOPSY Right 07/03/11   Invasive Ductal Carcinoma;0/3 nodes negative,; ER,PR Positive, Her-2 Neg; Ki-67 22%  . [place]; 1974  . COLONOSCOPY    . DILATION AND CURETTAGE OF UTERUS  1970's  . RIGHT/LEFT HEART CATH AND CORONARY ANGIOGRAPHY N/A 01/02/2019   Procedure: RIGHT/LEFT HEART CATH AND CORONARY ANGIOGRAPHY;  Surgeon: Larey Dresser, MD;  Location: [facility];  Service: Cardiovascular;  Laterality: N/A;  . TEE WITHOUT CARDIOVERSION N/A 01/31/2019   Procedure: TRANSESOPHAGEAL ECHOCARDIOGRAM (TEE);  Surgeon: Prescott Gum, Collier Salina, MD;  Location: [facility];  Service: Open Heart Surgery;  Laterality: N/A;  . TUBAL LIGATION  1974  . WISDOM TOOTH EXTRACTION       Current Meds  Medication Sig  . amoxicillin (AMOXIL) 500 MG tablet TAKE 4 TABLETS 1 HOUR BEFORE DENTAL EXAMINATION  . aspirin EC 81 MG tablet Take 81 mg by mouth daily.  [name] atorvastatin (LIPITOR) 20 MG tablet Take 20 mg by mouth daily.   .  carvedilol (COREG) 12.5 MG tablet Take 1 tablet (12.5 mg total) by mouth 2 (two) times daily with a meal.  . cholecalciferol (VITAMIN D3) 25 MCG (1000 UT) tablet Take 1,000 Units by mouth daily.  [name] denosumab (PROLIA) 60 MG/ML SOSY injection Inject 60 mg into the skin every 6 (six) months.  [name] guaiFENesin (MUCINEX) 600 MG 12 hr tablet Take 1 tablet (600 mg total) by mouth 2 (two) times daily.  . sertraline (ZOLOFT) 100 MG tablet Take 100 mg by mouth 2 (two) times a day.   . telmisartan (MICARDIS) 80 MG tablet Take 1 tablet (80 mg total) by mouth daily.  . Tiotropium Bromide-Olodaterol (STIOLTO RESPIMAT) 2.5-2.5 MCG/ACT AERS Inhale 2 puffs into the lungs daily.  . vitamin B-12 (CYANOCOBALAMIN) 500 MCG tablet Take 500 mcg by mouth daily.     Allergies:   Patient has no known allergies.   Social History   Tobacco Use  . Smoking status: Former Smoker    Packs/day: 3.00    Years: 30.00    Pack years: 90.00    Types: Cigarettes    Start date: 05/19/1958  Quit date: 06/30/1990    Years since quitting: 29.3  . Smokeless tobacco: Never Used  . Tobacco comment: Stopped smoking for 2-3 years during second child.   Substance Use Topics  . Alcohol use: Yes    Comment: 1-2 glasses wine/month  . Drug use: No     Family Hx: The patient's family history includes Cancer in her father and paternal grandmother; Heart disease in her maternal grandfather, maternal grandmother, and mother. There is no history of Lung disease.  ROS:   Please see the history of present illness.     All other systems reviewed and are negative.   Prior CV studies:   The following studies were reviewed today:  Echo 10/04/2018 IMPRESSIONS 1. The left ventricle has mild-moderately reduced systolic function, with an ejection fraction of 40-45%. The cavity size was normal. Left ventricular diastolic Doppler parameters are consistent with impaired relaxation. Left ventricular diffuse  hypokinesis. 2. The right ventricle  has normal systolic function. The cavity was normal. 3. The mitral valve is abnormal. Mild thickening of the mitral valve leaflet. 4. The tricuspid valve is grossly normal. 5. The aortic valve is tricuspid. Mild thickening of the aortic valve. Aortic valve regurgitation is severe by color flow Doppler. No stenosis of the aortic valve. 6. Mild to moderate global reduction in LV systolic function; mild diastolic dysfunction; severe AI; mild MR.   Cath 01/02/2019 1. Low filling pressures and normal PA pressure.  2. Preserved cardiac output.  3. Wide aortic pulse pressure is suggestive of significant aortic insufficiency. Aortic root shot was not done due to CKD (GFR around 30).  4. Mild nonobstructive CAD. 30 cc contrast used.    AVR 01/31/2019 PRE-OPERATIVE DIAGNOSIS: SEVERE Aortic insufficiency   POST-OPERATIVE DIAGNOSIS: SEVERE Aortic insufficiency  PROCEDURE:Procedure(s): AORTIC VALVE REPLACEMENT (AVR) USING 21 MM INSPIRIS RESILIS AORTIC VALVE. SN: [id number] (N/A) TRANSESOPHAGEAL ECHOCARDIOGRAM (TEE) (N/A)   Echo 10/11/2019 1. Left ventricular ejection fraction, by estimation, is 55 to 60%. The  left ventricle has normal function. The left ventricle has no regional  wall motion abnormalities. There is mild concentric left ventricular  hypertrophy. Left ventricular diastolic  parameters were normal.  2. Right ventricular systolic function is mildly reduced. The right  ventricular size is normal. There is normal pulmonary artery systolic  pressure.  3. The mitral valve is normal in structure. Trivial mitral valve  regurgitation. No evidence of mitral stenosis.  4. The aortic valve has been repaired/replaced. Aortic valve  regurgitation is not visualized. There is a 21 mm Inspiris Resilis Aortic  Valve valve present in the aortic position. Procedure Date: 01/31/2019.  Echo findings are consistent with normal  structure and function of the aortic valve prosthesis.    5. The inferior vena cava is normal in size with greater than 50%  respiratory variability, suggesting right atrial pressure of 3 mmHg.   Labs/Other Tests and Data Reviewed:    EKG:  An ECG dated 02/24/2019 was personally reviewed today and demonstrated:  Normal sinus rhythm with T wave inversion in the inferolateral leads.  Recent Labs: 12/16/2018: B Natriuretic Peptide 321.3 01/27/2019: ALT 16 02/06/2019: BUN 18; Creatinine, Ser 0.96; Hemoglobin 11.5; Magnesium 1.9; Platelets 225; Potassium 4.2; Sodium 140; TSH 1.427   Recent Lipid Panel Lab Results  Component Value Date/Time   CHOL 140 12/17/2018 04:30 AM   CHOL 197 02/16/2014 08:26 AM   TRIG 103 12/17/2018 04:30 AM   HDL 38 (L) 12/17/2018 04:30 AM   HDL 52 02/16/2014 08:26 AM  CHOLHDL 3.7 12/17/2018 04:30 AM   LDLCALC 81 12/17/2018 04:30 AM   LDLCALC 127 (H) 02/16/2014 08:26 AM    Wt Readings from Last 3 Encounters:  10/23/19 122 lb (55.3 kg)  08/29/19 126 lb (57.2 kg)  05/31/19 125 lb (56.7 kg)     Objective:    Vital Signs:  Ht _0  (1.626 m)   Wt 122 lb (55.3 kg)   LMP  (LMP Unknown)   BMI 20.94 kg/m    VITAL SIGNS:  reviewed  ASSESSMENT & PLAN:    1. Status post AVR: Stable on recent echocardiogram.  Consider repeat echocardiogram near the end of April 2022.  2. Perioperative A. fib: Occurred during the perioperative phase of AVR.  Since then she has not had any further recurrence.  She is no longer on amiodarone and Coumadin therapy.  In the past 6 months, she noted only a single episode of slight palpitation which is quite transient and has since gone away.  The symptom is too rare for a 30-day monitor.  I recommended continue observation  3. Nonischemic cardiomyopathy with normalized EF: EF improved after AVR in 2020.  Last echocardiogram showed ejection fraction 55 to 60% in April 2021  4. CAD: Pre-AVR cardiac catheterization showed 20% proximal LAD and 40% distal RCA lesion.  She denies any chest  pain  5. Hypertension: Systolic blood pressure typically running between 100-120s, given orthostatic dizziness, I decided to cut her telmisartan to 40 mg daily.  6. Hyperlipidemia: Continue Lipitor.  Will defer annual lipid panel to Dr. Pennie Banter office.  LDL goal less than 70.  7. Orthostatic dizziness: Recommend cutting telmisartan down to 40 mg daily.  Her current baseline systolic blood pressures around 100-120s, cutting the telmisartan down to a half a tablet will allow her blood pressure to drift up slightly.  She is aware that she may still continue to have some degree of orthostatic dizziness and is to be careful when she change body positions.  COVID-19 Education: The signs and symptoms of COVID-19 were discussed with the patient and how to seek care for testing (follow up with PCP or arrange E-visit).  The importance of social distancing was discussed today.  Time:   Today, I have spent 10 minutes with the patient with telehealth technology discussing the above problems.     Medication Adjustments/Labs and Tests Ordered: Current medicines are reviewed at length with the patient today.  Concerns regarding medicines are outlined above.   Tests Ordered: No orders of the defined types were placed in this encounter.   Medication Changes: No orders of the defined types were placed in this encounter.   Follow Up:  Either In Person or Virtual in 6 month(s)  Signed, Almyra Deforest, [place]  10/23/2019 11:11 AM    [place]

## 2019-10-23 NOTE — Addendum Note (Signed)
Addended by: [name] on: 10/23/2019 11:20 AM   Modules accepted: Orders

## 2019-10-23 NOTE — Telephone Encounter (Signed)
Called patient to discuss AVS instructions gave Dana Pruitt Meng's recommendations and patient voiced that she thought of a question/concern after hanging up with Dana Pruitt. I informed patient that I will have him give her a call back. Patient voiced an understanding and thanked me for getting her concern to him.   Called patient to discuss AVS instructions gave Dana Pruitt Meng's recommendations and patient voiced understanding. AVS summary mailed to patient.

## 2019-10-24 ENCOUNTER — Telehealth: Payer: Medicare Other | Admitting: Physician Assistant

## 2019-10-24 IMAGING — DX DG CHEST 2V
2 series · 2 of 2 positions shown · non-contrast
Comparison: Radiographs dated 02/03/2019 and 01/27/2019

CLINICAL DATA: Follow-up of aortic valve replacement on 01/31/2019.
Pleural effusions on radiograph dated 02/03/2019.

EXAM:
CHEST - 2 VIEW

[dg chest 2 view (1 of 2)]
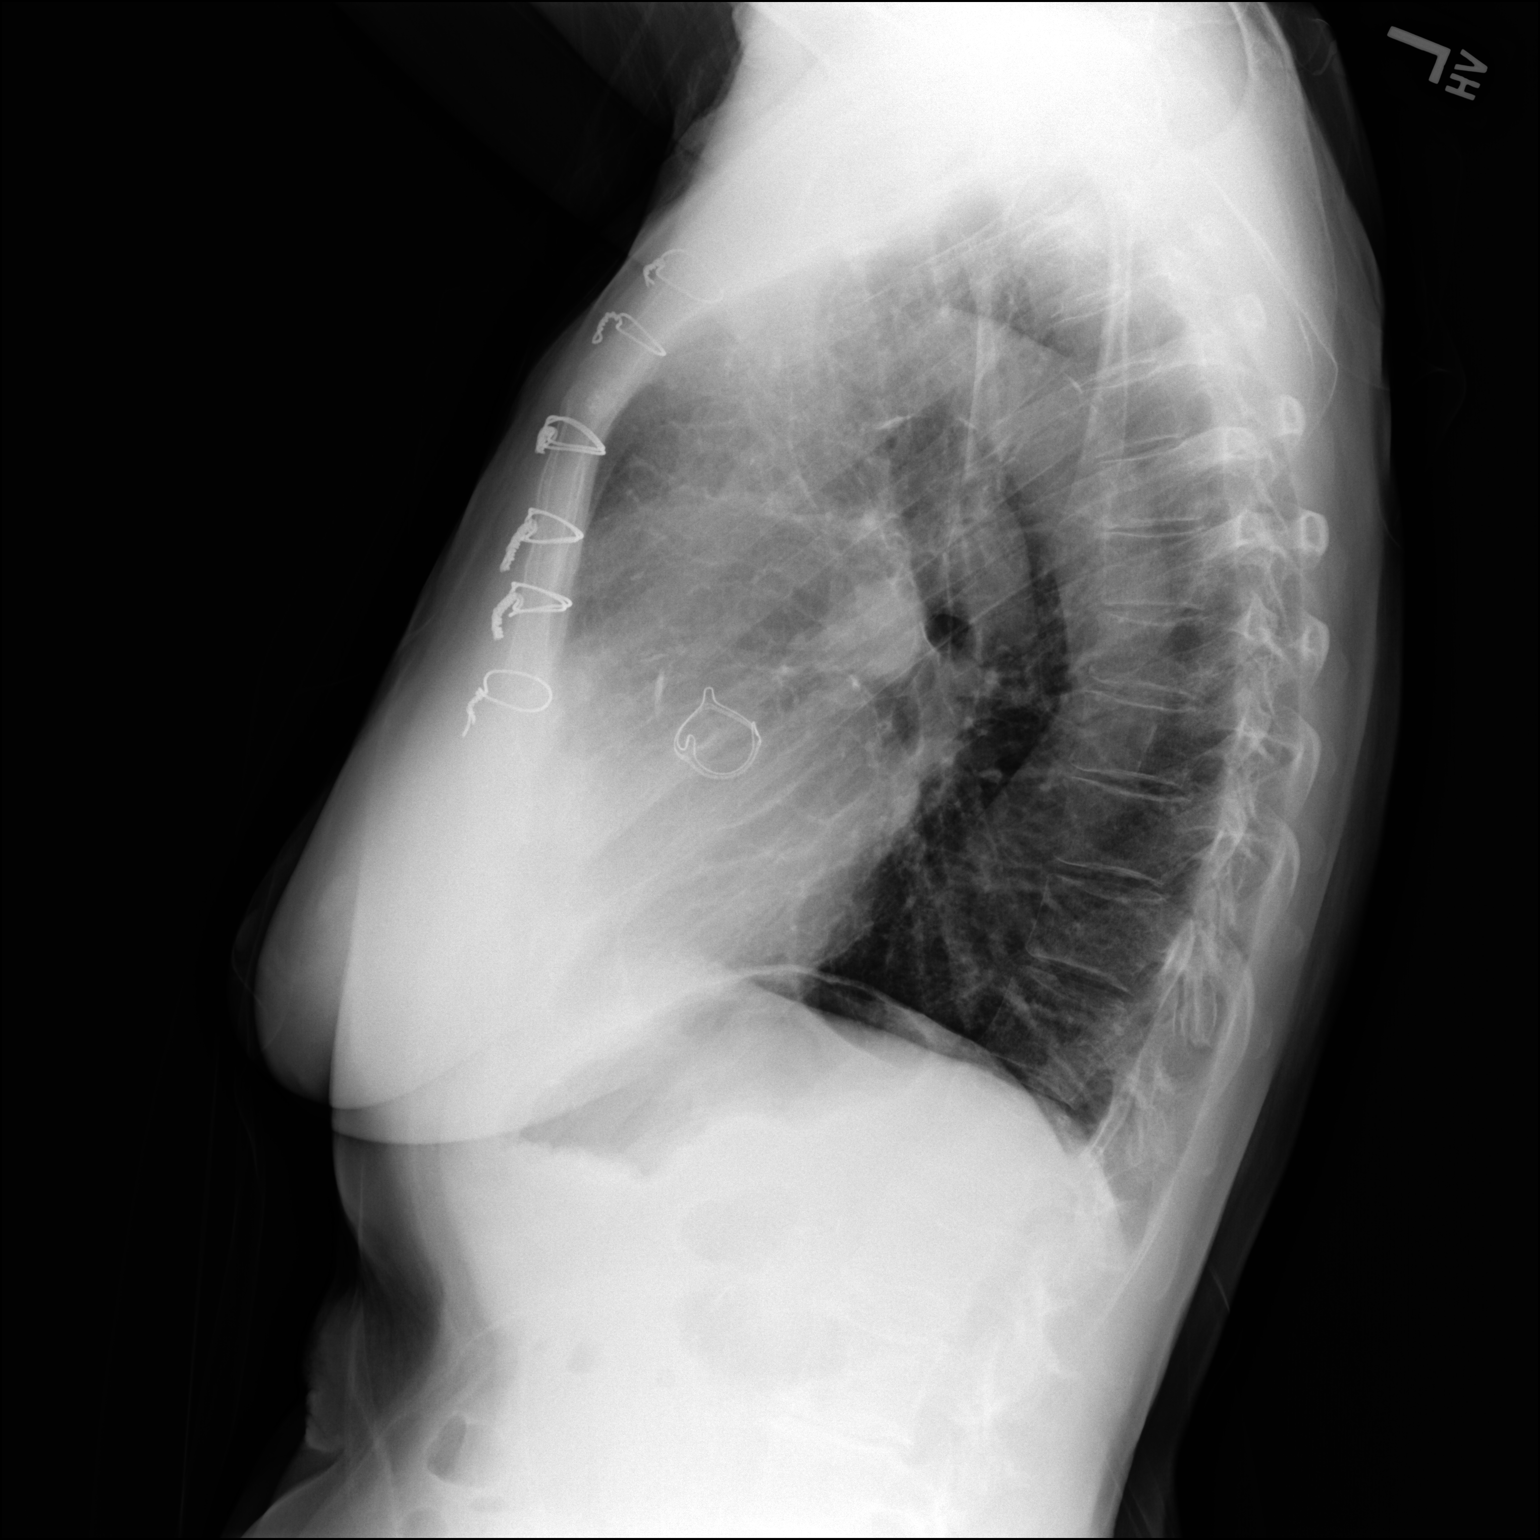

[dg chest 2 view (2 of 2)]
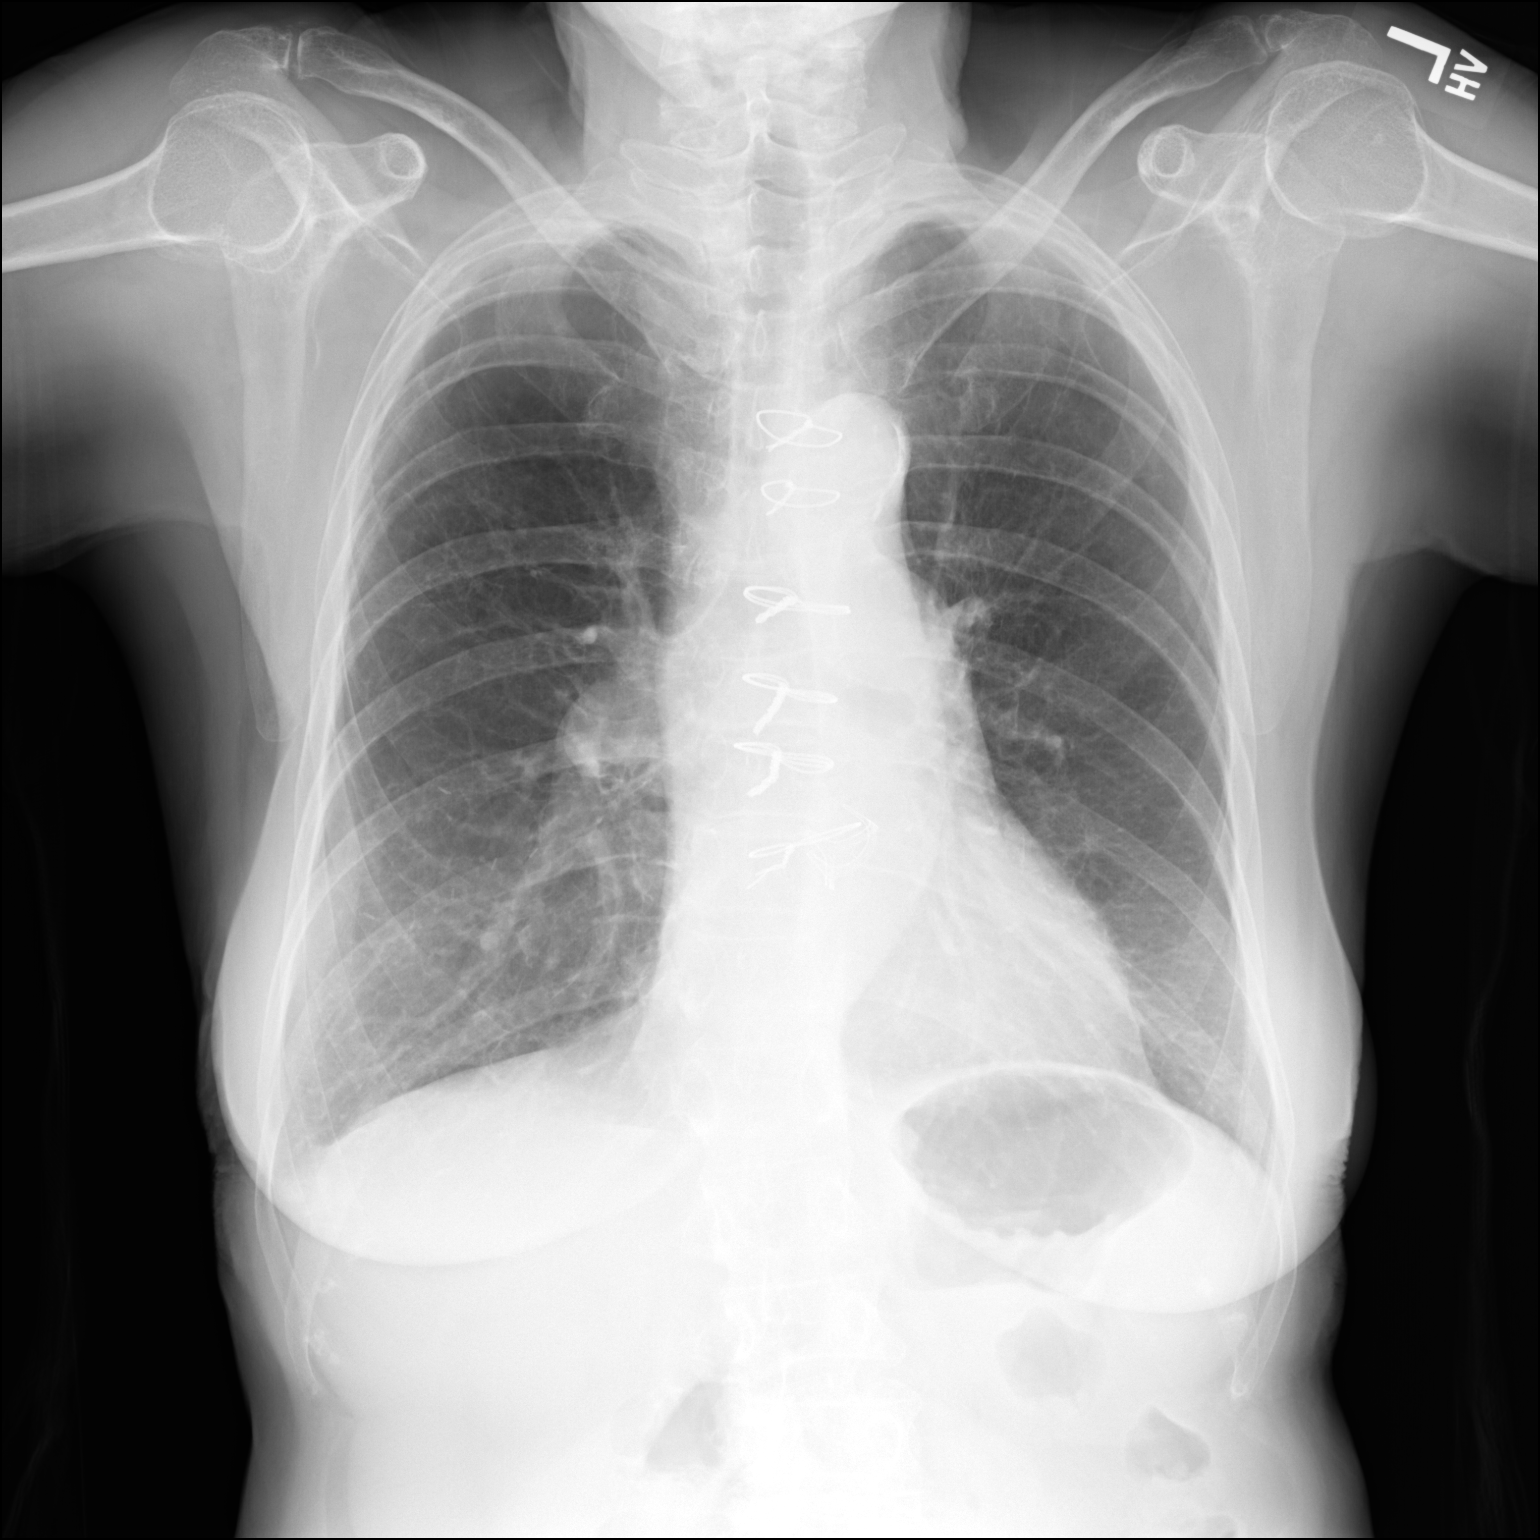

[2 of 2 positions shown; findings below may reference images not displayed]

FINDINGS: The heart size and pulmonary vascularity are normal. Lungs are
clear. No effusions. Prosthetic aortic valve noted. No bone
abnormality.

Aortic atherosclerosis.
IMPRESSION: No acute cardiopulmonary disease. Complete resolution of the pleural
effusions seen on the prior study. No hiatal hernia. Aortic
atherosclerosis.

## 2019-11-06 DIAGNOSIS — H40013 Open angle with borderline findings, low risk, bilateral: Secondary | ICD-10-CM | POA: Diagnosis not present

## 2019-11-06 DIAGNOSIS — H2513 Age-related nuclear cataract, bilateral: Secondary | ICD-10-CM | POA: Diagnosis not present

## 2019-11-14 ENCOUNTER — Ambulatory Visit: Payer: Medicare Other | Admitting: Internal Medicine

## 2019-11-15 ENCOUNTER — Other Ambulatory Visit: Payer: Self-pay

## 2019-11-15 MED ORDER — CARVEDILOL 12.5 MG PO TABS: 12.5000 mg | ORAL_TABLET | Freq: Two times a day (BID) | ORAL | 1 refills | Status: DC

## 2019-12-08 ENCOUNTER — Telehealth: Payer: Self-pay | Admitting: *Deleted

## 2019-12-08 NOTE — Telephone Encounter (Signed)
This RN returned pt's call per her concern with new tenderness in breast with hx of cancer and surgery " and do I need a mammogram "  Note pt has been released from care " but didn't know who to call "  Dana Pruitt states she noticed nipple tenderness several days ago- and " I think I feel a little something there but just not sure"  She denies any discharge.  Presently she states area " is actually feeling a little better "  She states her last mammogram was approximately 6 months ago.  Per discussion this RN discussed above symptoms - with verification that area of concern was limited to nipple , and not worsening- this RN offered visit for next week- with pt stating she has also called her primary too and will be seeing him on Monday.  This RN verified above very appropriate. She asked " if wants me to do another mammogram- should I come to you or let him order it"  This RN informed her if MD evaluates and feels a mammogram would be beneficial to allow him to schedule- if he is uncertain and does not palpate an area of concern and wants her seen by this office we are glad to assist.  Plan per end of call is pt will proceed with primary MD - and call this RN post visit with update for possible additional follow up.

## 2019-12-11 DIAGNOSIS — R0981 Nasal congestion: Secondary | ICD-10-CM | POA: Diagnosis not present

## 2019-12-11 DIAGNOSIS — N644 Mastodynia: Secondary | ICD-10-CM | POA: Diagnosis not present

## 2020-01-04 DIAGNOSIS — R448 Other symptoms and signs involving general sensations and perceptions: Secondary | ICD-10-CM | POA: Diagnosis not present

## 2020-01-04 DIAGNOSIS — R922 Inconclusive mammogram: Secondary | ICD-10-CM | POA: Diagnosis not present

## 2020-01-04 DIAGNOSIS — N644 Mastodynia: Secondary | ICD-10-CM | POA: Diagnosis not present

## 2020-01-04 DIAGNOSIS — Z853 Personal history of malignant neoplasm of breast: Secondary | ICD-10-CM | POA: Diagnosis not present

## 2020-01-09 ENCOUNTER — Other Ambulatory Visit: Payer: Self-pay | Admitting: Cardiovascular Disease

## 2020-01-18 DIAGNOSIS — E7800 Pure hypercholesterolemia, unspecified: Secondary | ICD-10-CM | POA: Diagnosis not present

## 2020-01-18 DIAGNOSIS — I10 Essential (primary) hypertension: Secondary | ICD-10-CM | POA: Diagnosis not present

## 2020-01-18 DIAGNOSIS — F411 Generalized anxiety disorder: Secondary | ICD-10-CM | POA: Diagnosis not present

## 2020-01-25 DIAGNOSIS — R42 Dizziness and giddiness: Secondary | ICD-10-CM | POA: Diagnosis not present

## 2020-01-25 DIAGNOSIS — I10 Essential (primary) hypertension: Secondary | ICD-10-CM | POA: Diagnosis not present

## 2020-01-25 DIAGNOSIS — E7800 Pure hypercholesterolemia, unspecified: Secondary | ICD-10-CM | POA: Diagnosis not present

## 2020-01-25 DIAGNOSIS — Z0000 Encounter for general adult medical examination without abnormal findings: Secondary | ICD-10-CM | POA: Diagnosis not present

## 2020-02-18 ENCOUNTER — Other Ambulatory Visit: Payer: Self-pay

## 2020-02-18 ENCOUNTER — Emergency Department (HOSPITAL_COMMUNITY): Payer: Medicare Other

## 2020-02-18 ENCOUNTER — Encounter (HOSPITAL_COMMUNITY): Payer: Self-pay

## 2020-02-18 ENCOUNTER — Encounter (HOSPITAL_COMMUNITY): Payer: Self-pay | Admitting: *Deleted

## 2020-02-18 ENCOUNTER — Emergency Department (HOSPITAL_COMMUNITY)
Admission: EM | Admit: 2020-02-18 | Discharge: 2020-02-18 | Disposition: A | Discharge status: Home / Self Care | Payer: Medicare Other | Attending: Emergency Medicine | Admitting: Emergency Medicine

## 2020-02-18 ENCOUNTER — Ambulatory Visit (HOSPITAL_COMMUNITY)
Admission: EM | Admit: 2020-02-18 | Discharge: 2020-02-18 | Disposition: A | Discharge status: Other Acute Inpatient Hospital | Payer: Medicare Other | Attending: Urgent Care | Admitting: Urgent Care

## 2020-02-18 DIAGNOSIS — Z952 Presence of prosthetic heart valve: Secondary | ICD-10-CM

## 2020-02-18 DIAGNOSIS — I110 Hypertensive heart disease with heart failure: Secondary | ICD-10-CM | POA: Diagnosis not present

## 2020-02-18 DIAGNOSIS — R002 Palpitations: Secondary | ICD-10-CM | POA: Diagnosis not present

## 2020-02-18 DIAGNOSIS — Z7982 Long term (current) use of aspirin: Secondary | ICD-10-CM | POA: Diagnosis not present

## 2020-02-18 DIAGNOSIS — J441 Chronic obstructive pulmonary disease with (acute) exacerbation: Secondary | ICD-10-CM | POA: Insufficient documentation

## 2020-02-18 DIAGNOSIS — R0902 Hypoxemia: Secondary | ICD-10-CM | POA: Diagnosis not present

## 2020-02-18 DIAGNOSIS — Z20822 Contact with and (suspected) exposure to covid-19: Secondary | ICD-10-CM | POA: Insufficient documentation

## 2020-02-18 DIAGNOSIS — I5043 Acute on chronic combined systolic (congestive) and diastolic (congestive) heart failure: Secondary | ICD-10-CM | POA: Insufficient documentation

## 2020-02-18 DIAGNOSIS — Z79899 Other long term (current) drug therapy: Secondary | ICD-10-CM | POA: Insufficient documentation

## 2020-02-18 DIAGNOSIS — I471 Supraventricular tachycardia: Secondary | ICD-10-CM

## 2020-02-18 DIAGNOSIS — R0602 Shortness of breath: Secondary | ICD-10-CM | POA: Diagnosis not present

## 2020-02-18 DIAGNOSIS — I4891 Unspecified atrial fibrillation: Secondary | ICD-10-CM | POA: Diagnosis not present

## 2020-02-18 DIAGNOSIS — Z8679 Personal history of other diseases of the circulatory system: Secondary | ICD-10-CM

## 2020-02-18 DIAGNOSIS — I499 Cardiac arrhythmia, unspecified: Secondary | ICD-10-CM | POA: Diagnosis not present

## 2020-02-18 DIAGNOSIS — R9431 Abnormal electrocardiogram [ECG] [EKG]: Secondary | ICD-10-CM

## 2020-02-18 DIAGNOSIS — Z87891 Personal history of nicotine dependence: Secondary | ICD-10-CM | POA: Diagnosis not present

## 2020-02-18 DIAGNOSIS — R531 Weakness: Secondary | ICD-10-CM

## 2020-02-18 LAB — CBC
HCT: 38.2 % (ref 36.0–46.0)
Hemoglobin: 12.4 g/dL (ref 12.0–15.0)
MCH: 32.3 pg (ref 26.0–34.0)
MCHC: 32.5 g/dL (ref 30.0–36.0)
MCV: 99.5 fL (ref 80.0–100.0)
Platelets: 226 10*3/uL (ref 150–400)
RBC: 3.84 MIL/uL — ABNORMAL LOW (ref 3.87–5.11)
RDW: 13.7 % (ref 11.5–15.5)
WBC: 6.6 10*3/uL (ref 4.0–10.5)
nRBC: 0 % (ref 0.0–0.2)

## 2020-02-18 LAB — TSH: TSH: 1.666 u[IU]/mL (ref 0.350–4.500)

## 2020-02-18 LAB — BRAIN NATRIURETIC PEPTIDE: B Natriuretic Peptide: 650.1 pg/mL — ABNORMAL HIGH (ref 0.0–100.0)

## 2020-02-18 LAB — BASIC METABOLIC PANEL
Anion gap: 9 (ref 5–15)
BUN: 20 mg/dL (ref 8–23)
CO2: 23 mmol/L (ref 22–32)
Calcium: 9.4 mg/dL (ref 8.9–10.3)
Chloride: 106 mmol/L (ref 98–111)
Creatinine, Ser: 1.16 mg/dL — ABNORMAL HIGH (ref 0.44–1.00)
GFR calc Af Amer: 53 mL/min — ABNORMAL LOW (ref >60)
GFR calc non Af Amer: 45 mL/min — ABNORMAL LOW (ref >60)
Glucose, Bld: 106 mg/dL — ABNORMAL HIGH (ref 70–99)
Potassium: 4.2 mmol/L (ref 3.5–5.1)
Sodium: 138 mmol/L (ref 135–145)

## 2020-02-18 LAB — SARS CORONAVIRUS 2 BY RT PCR (HOSPITAL ORDER, PERFORMED IN ~~LOC~~ HOSPITAL LAB): SARS Coronavirus 2: NEGATIVE

## 2020-02-18 LAB — MAGNESIUM: Magnesium: 1.9 mg/dL (ref 1.7–2.4)

## 2020-02-18 MED ORDER — SODIUM CHLORIDE 0.9 % IV BOLUS
1000.0000 mL | Freq: Once | INTRAVENOUS | Status: AC
  Administered 2020-02-18: 1000 mL via INTRAVENOUS

## 2020-02-18 MED ORDER — DILTIAZEM HCL 25 MG/5ML IV SOLN: 15.0000 mg | Freq: Once | INTRAVENOUS | Status: DC

## 2020-02-18 NOTE — ED Notes (Signed)
Pf[employer] vaccine given last inj was in february

## 2020-02-18 NOTE — ED Notes (Signed)
Called EMS 

## 2020-02-18 NOTE — ED Provider Notes (Signed)
[facility] EMERGENCY DEPARTMENT Provider Note   CSN: [id number] Arrival date & time: 02/18/20  1608     History Chief Complaint  Patient presents with  . Irregular Heart Beat    Dana Pruitt is a 79 y.o. female past medical history significant for anemia, breast cancer, CHF, COPD, GERD, hyperlipidemia, hypertension, s/p avr in 2020. Cardiologist is Dr. [name]. Not on anticoagulation. Echo 10/11/2019 with EF 55-60%. Patient received Covid vaccinations.  HPI Patient presents to emergency department today via EMS from urgent care with chief complaint of shortness of breath and lightheadedness x2 days.  She states her symptoms have been constant and progressively worsening since onset.  She was found to have heart rate in the 150s with BP 104/72.  Prior to presenting to urgent care she checked her blood pressure at home and it was 68/40. She received 500 ml NS and 10 Cardizem in route.   She states she woke up during the night with palpitations and rapid heart rate.  She had not had that the day before.  Palpitations have resolved but she has continued to have intermittent rapid heart rate throughout the day prompting her visit to urgent care. She does admit to recently stopping her statin as she was having persistent myalgias. She denies any recent illness, fever, chills, cough, shortness of breath, chest pain, abdominal pain, nausea, urinary symptoms, diarrhea.  Patient had AVR in 2020 with Dr. Kerby Less.  During hospital stay she was found to be in A. Fib, treated with amiodarone.   Past Medical History:  Diagnosis Date  . Anemia    "several times over the years" (10/17/2012)  . Anxiety   . Breast cancer ([place]) 02/2010   right breast  . Carcinoma of breast treated with adjuvant hormone therapy ([place]) 9/11   Femara  . CHF (congestive heart failure) ([place])   . COPD (chronic obstructive pulmonary disease) ([place])   . Depression   . Exertional shortness of breath    "recently"  (10/17/2012)  . GERD (gastroesophageal reflux disease)    diet controlled, no med  . Hollenhorst plaque, left eye    apparently resolved at recent ophthalmology visit  . Hyperlipidemia   . Hypertension   . 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 Left ventricular dysfunction   . Moderate aortic insufficiency   . Pneumonia    "that's why I'm here" (10/17/2012)  . S/P AVR (aortic valve replacement) 04/26/2019    Patient Active Problem List   Diagnosis Date Noted  . Neck muscle spasm 08/30/2019  . S/P AVR (aortic valve replacement) 04/26/2019  . DOE (dyspnea on exertion) 04/19/2019  . Essential hypertension 04/19/2019  . Chronic respiratory failure with hypoxia ([place]) 04/19/2019  . Atrial fibrillation ([place]) 02/09/2019  . Aortic valve disease 01/31/2019  . Chest tightness   . Acute on chronic combined systolic and diastolic CHF (congestive heart failure) ([place])   . Pulmonary hypertension ([place])   . Chest pain 12/16/2018  . Chronic systolic CHF (congestive heart failure) ([place]) 12/16/2018  . Hypertensive urgency 12/16/2018  . Sepsis secondary to UTI ([place]) 12/16/2018  . Severe aortic regurgitation 09/05/2014  . Left ventricular dysfunction 05/11/2014  . HLD (hyperlipidemia) 03/28/2014  . Hollenhorst plaque, left eye 02/15/2014  . Osteopenia 11/16/2013  . COPD  GOLD II 11/22/2012  . Acute hypoxemic respiratory failure ([place]) 10/17/2012  . Depression 10/17/2012  . Dehydration 10/17/2012  . Debility 10/17/2012  . Primary cancer of upper outer quadrant of right female breast (  [place]) 02/27/2010    Past Surgical History:  Procedure Laterality Date  . AORTIC VALVE REPLACEMENT N/A 01/31/2019   Procedure: AORTIC VALVE REPLACEMENT (AVR) USING 21 MM INSPIRIS RESILIS AORTIC VALVE. SN: [id number];  Surgeon: Prescott Gum, Collier Salina, MD;  Location: [facility];  Service: Open Heart Surgery;  Laterality: N/A;  . APPENDECTOMY  1974  . BREAST BIOPSY Right 02/27/10   Needle core Biopsy; Invasive Mammary; ER/PR  Positive, Her-2 Neu negative, Ki-67 22%  . BREAST LUMPECTOMY WITH SENTINEL LYMPH NODE BIOPSY Right 07/03/11   Invasive Ductal Carcinoma;0/3 nodes negative,; ER,PR Positive, Her-2 Neg; Ki-67 22%  . [place]; 1974  . COLONOSCOPY    . DILATION AND CURETTAGE OF UTERUS  1970's  . RIGHT/LEFT HEART CATH AND CORONARY ANGIOGRAPHY N/A 01/02/2019   Procedure: RIGHT/LEFT HEART CATH AND CORONARY ANGIOGRAPHY;  Surgeon: Larey Dresser, MD;  Location: [facility] [place] [facility];  Service: Cardiovascular;  Laterality: N/A;  . TEE WITHOUT CARDIOVERSION N/A 01/31/2019   Procedure: TRANSESOPHAGEAL ECHOCARDIOGRAM (TEE);  Surgeon: Prescott Gum, Collier Salina, MD;  Location: [facility];  Service: Open Heart Surgery;  Laterality: N/A;  . TUBAL LIGATION  1974  . WISDOM TOOTH EXTRACTION       OB History    Gravida  2   Para  2   Term      Preterm      AB      Living        SAB      TAB      Ectopic      Multiple      Live Births           Obstetric Comments  Menarche age 58, 2 c-sections, menopause in 57's        Family History  Problem Relation Age of Onset  . Heart disease Mother   . Cancer Father   . Heart disease Maternal Grandmother   . Heart disease Maternal Grandfather   . Cancer Paternal Grandmother   . Lung disease Neg Hx     Social History   Tobacco Use  . Smoking status: Former Smoker    Packs/day: 3.00    Years: 30.00    Pack years: 90.00    Types: Cigarettes    Start date: 05/19/1958    Quit date: 06/30/1990    Years since quitting: 29.6  . Smokeless tobacco: Never Used  . Tobacco comment: Stopped smoking for 2-3 years during second child.   Vaping Use  . Vaping Use: Never used  Substance Use Topics  . Alcohol use: Yes    Comment: 1-2 glasses wine/month  . Drug use: No    Home Medications Prior to Admission medications   Medication Sig Start Date End Date Taking? Authorizing Provider  amoxicillin (AMOXIL) 500 MG tablet TAKE 4 TABLETS 1 HOUR BEFORE DENTAL  EXAMINATION 09/05/19   Prescott Gum, Collier Salina, MD  aspirin EC 81 MG tablet Take 81 mg by mouth daily.    [provider]  atorvastatin (LIPITOR) 20 MG tablet Take 20 mg by mouth daily.  02/12/14   [provider]  carvedilol (COREG) 12.5 MG tablet TAKE 1 TABLET(12.5 MG) BY MOUTH TWICE DAILY WITH A MEAL 01/10/20   Lorretta Harp, MD  cholecalciferol (VITAMIN D3) 25 MCG (1000 UT) tablet Take 1,000 Units by mouth daily.    [provider]  denosumab (PROLIA) 60 MG/ML SOSY injection Inject 60 mg into the skin every 6 (six) months.    [provider]  guaiFENesin (MUCINEX) 600 MG 12 hr tablet Take 1 tablet (600 mg total) by mouth 2 (two) times daily. 02/07/19   Gold, Wayne E, PA-C  sertraline (ZOLOFT) 100 MG tablet Take 100 mg by mouth 2 (two) times a day.     [provider]  telmisartan (MICARDIS) 40 MG tablet Take 1 tablet (40 mg total) by mouth daily. 10/23/19   Almyra Deforest, PA  Tiotropium Bromide-Olodaterol (STIOLTO RESPIMAT) 2.5-2.5 MCG/ACT AERS Inhale 2 puffs into the lungs daily. 05/31/19   Tanda Rockers, MD  vitamin B-12 (CYANOCOBALAMIN) 500 MCG tablet Take 500 mcg by mouth daily.    [provider]    Allergies    Patient has no known allergies.  Review of Systems   Review of Systems All other systems are reviewed and are negative for acute change except as noted in the HPI.  Physical Exam Updated Vital Signs BP 105/71 (BP Location: Right Arm)   Pulse (!) 103   Temp 98.2 F (36.8 C) (Oral)   Resp 20   Ht _0  (1.6 m)   Wt 55.3 kg   LMP  (LMP Unknown)   SpO2 92%   BMI 21.61 kg/m   Physical Exam Vitals and nursing note reviewed.  Constitutional:      General: She is not in acute distress.    Appearance: She is not ill-appearing.  HENT:     Head: Normocephalic and atraumatic.     Right Ear: Tympanic membrane and external ear normal.     Left Ear: Tympanic membrane and external ear normal.     Nose: Nose normal.      Mouth/Throat:     Mouth: Mucous membranes are moist.     Pharynx: Oropharynx is clear.  Eyes:     General: No scleral icterus.       Right eye: No discharge.        Left eye: No discharge.     Extraocular Movements: Extraocular movements intact.     Conjunctiva/sclera: Conjunctivae normal.     Pupils: Pupils are equal, round, and reactive to light.  Neck:     Vascular: No JVD.  Cardiovascular:     Rate and Rhythm: Tachycardia present. Rhythm irregular.     Pulses: Normal pulses.          Radial pulses are 2+ on the right side and 2+ on the left side.     Heart sounds: Normal heart sounds.     Comments: Heart rate ranging from 100-120 during exam Pulmonary:     Comments: Lungs clear to auscultation in all fields. Symmetric chest rise. No wheezing, rales, or rhonchi.  Oxygen saturation is 94% on room air. Abdominal:     Comments: Abdomen is soft, non-distended, and non-tender in all quadrants. No rigidity, no guarding. No peritoneal signs.  Musculoskeletal:        General: Normal range of motion.     Cervical back: Normal range of motion.     Right lower leg: No edema.     Left lower leg: No edema.  Skin:    General: Skin is warm and dry.     Capillary Refill: Capillary refill takes less than 2 seconds.  Neurological:     Mental Status: She is oriented to person, place, and time.     GCS: GCS eye subscore is 4. GCS verbal subscore is 5. GCS motor subscore is 6.     Comments: Fluent speech, no facial droop.  Psychiatric:  Behavior: Behavior normal.     ED Results / Procedures / Treatments   Labs (all labs ordered are listed, but only abnormal results are displayed) Labs Reviewed  BASIC METABOLIC PANEL - Abnormal; Notable for the following components:      Result Value   Glucose, Bld 106 (*)    Creatinine, Ser 1.16 (*)    GFR calc non Af Amer 45 (*)    GFR calc Af Amer 53 (*)    All other components within normal limits  CBC - Abnormal; Notable for the following  components:   RBC 3.84 (*)    All other components within normal limits  BRAIN NATRIURETIC PEPTIDE - Abnormal; Notable for the following components:   B Natriuretic Peptide 650.1 (*)    All other components within normal limits  SARS CORONAVIRUS 2 BY RT PCR (HOSPITAL ORDER, [facility])  MAGNESIUM  TSH    EKG EKG Interpretation  Date/Time:  Sunday February 18 2020 16:12:10 EDT Ventricular Rate:  118 PR Interval:    QRS Duration: 75 QT Interval:  357 QTC Calculation: 501 R Axis:   -81 Text Interpretation: Atrial fibrillation Left anterior fascicular block Anteroseptal infarct, old Borderline repolarization abnormality Prolonged QT interval Baseline wander in lead(s) V6 similar to prior today Confirmed by Aletta Edouard [phone]) on 02/18/2020 4:25:02 PM   EKG Interpretation  Date/Time:  Sunday February 18 2020 18:15:50 EDT Ventricular Rate:  68 PR Interval:    QRS Duration: 90 QT Interval:  407 QTC Calculation: 433 R Axis:   -63 Text Interpretation: Sinus rhythm Left anterior fascicular block Anteroseptal infarct, age indeterminate sinus replacing afib with rvr Confirmed by Aletta Edouard [phone]) on 02/18/2020 6:18:26 PM        Radiology DG Chest Port 1 View  Result Date: 02/18/2020 CLINICAL DATA:  Shortness of breath EXAM: PORTABLE CHEST 1 VIEW COMPARISON:  Chest radiograph dated 03/08/2019 FINDINGS: Median sternotomy wires and aortic valve replacement are noted. The heart size is normal. Vascular calcifications are seen in the aortic arch. Both lungs are clear. The visualized skeletal structures are unremarkable. IMPRESSION: No active pulmonary disease. Electronically Signed   By: Zerita Boers M.D.   On: 02/18/2020 16:55    Procedures .Critical Care Performed by: Cherre Robins, PA-C Authorized by: Cherre Robins, PA-C   Critical care provider statement:    Critical care time (minutes):  32   Critical care was time spent personally by  me on the following activities:  Development of treatment plan with patient or surrogate, discussions with consultants, evaluation of patient's response to treatment, examination of patient, obtaining history from patient or surrogate, ordering and performing treatments and interventions, ordering and review of laboratory studies, ordering and review of radiographic studies, pulse oximetry, re-evaluation of patient's condition and review of old charts   I assumed direction of critical care for this patient from another provider in my specialty: no     (including critical care time)  Medications Ordered in ED Medications  sodium chloride 0.9 % bolus 1,000 mL (0 mLs Intravenous Stopped 02/18/20 1829)    ED Course  I have reviewed the triage vital signs and the nursing notes.  Pertinent labs & imaging results that were available during my care of the patient were reviewed by me and considered in my medical decision making (see chart for details).  Clinical Course as of Feb 18 2103  Nancy Fetter Feb 18, 9732  374 79 year old female prior history of A. fib  not on anticoagulation complaining of feeling lightheaded mildly short of breath starting overnight tonight found to be in A. fib at urgent care clinic.  She was given some Cardizem IV and brought here.  Heart rate in the low 100s blood pressure soft.  Denies any chest pain.  Getting fluids lab work and will review with cardiology regarding whether she be a candidate for cardioversion.   [MB]  1905 Plan from cardiology was to admit for TTE.  Reevaluate patient on the monitor and she appears to be in normal sinus.  Rate of 60.  Repeat EKG does demonstrate that she self converted.  Reviewed with cardiology and they are comfortable with her going home.   [MB]    Clinical Course User Index [MB] Hayden Rasmussen, MD   Vitals:   02/18/20 1817 02/18/20 1830 02/18/20 1900 02/18/20 2027  BP: 123/80  101/76 (!) 133/102  Pulse: 67 64 67 67  Resp: _0 Temp:      TempSrc:      SpO2: 93% 95% 93% 97%  Weight:      Height:        MDM Rules/Calculators/A&P                          History provided by patient with additional history obtained from chart review.    79 yo female presenting with rapid heart rate. Was in afib rvr with rate in 150s at urgent care, received 52m cardizem and 500 ml ns by ems.  On exam no respiratory distress. No signs of being volume overloaded.  On arrival to ED heart rate ranging from 100-120. BP is soft. Will give IVF and check labs. ?cardioversion candidate, will consult cards.  EKG on arrival shows A. fib with a rate of 118.    CBC unremarkable. BMP shows slight bump in creatinine at 1.16, baseline appears to be <1, BUN normal, no significant electrolyte abnormalities.  As patient complained of shortness of breath BNP was ordered and is elevated at 650.  1 year ago it was 321.  TSH within normal range. I viewed pt's chest xray and it does not suggest acute infectious processes. BP improved after fluids.  Case discussed with on call cardiologist Dr. MRodena Pietywho recommends TEE before proceeding to cardioversion unless urgent cardioversion is required. At this time it is not, will plan for admission. The patient was discussed with and seen by Dr. BMelina Copawho agrees with the treatment plan.  While updating patient on her lab results cardiac monitor showing sinus rhythm.  EKG shows sinus rhythm with a rate in the 60s. Re consulted cardiology who agrees with plan to discharge home and follow up in afib clinic. Referral sent.  I checked patient's blood pressure prior to discharge and it was 113/72.  She is asymptomatic at time of disposition.  Eager to be discharged home.  The patient appears reasonably screened and/or stabilized for discharge and I doubt any other medical condition or other [facility]iring further screening, evaluation, or treatment in the ED at this time prior to discharge. The patient is safe for  discharge with strict return precautions discussed. Recommend pcp follow up within 1 week to have creatinine rechecked.    CHA2DS2-VASc Score = 5  The patient's score is based upon: CHF History: 1 HTN History: 1 Age : 2 Diabetes History: 0 Stroke History: 0 Vascular Disease History: 0 Gender: 1  Portions of this note were generated with [employer]. Dictation errors may occur despite best attempts at proofreading.    Final Clinical Impression(s) / ED Diagnoses Final diagnoses:  Atrial fibrillation, unspecified type [facility])    Rx / DC Orders ED Discharge Orders         Ordered    Amb referral to AFIB Clinic        02/18/20 [place], [place] 02/18/20 2105    Hayden Rasmussen, MD 02/18/20 2311

## 2020-02-18 NOTE — ED Provider Notes (Signed)
[facility]   MRN: [id number] DOB: 1941-02-07  Subjective:   Dana Pruitt is a 79 y.o. female presenting for 1 week history of persistent dizziness, weakness, palpitations.  Patient had an aortic valve replacement about a year ago.  Around the surgery, she was found to have atrial fibrillation.  Prior to that she had no history of abnormal heart rhythms.  She does have a history of CHF, hypertension, left ventricular dysfunction.  Today in clinic she denies confusion, chest pain, shortness of breath, belly pain.  She did check her blood pressure at home and had readings of 68/40.  No current facility-administered medications for this encounter.  Current Outpatient Medications:  .  aspirin EC 81 MG tablet, Take 81 mg by mouth daily., Disp: , Rfl:  .  atorvastatin (LIPITOR) 20 MG tablet, Take 20 mg by mouth daily. , Disp: , Rfl:  .  carvedilol (COREG) 12.5 MG tablet, TAKE 1 TABLET(12.5 MG) BY MOUTH TWICE DAILY WITH A MEAL, Disp: 60 tablet, Rfl: 0 .  cholecalciferol (VITAMIN D3) 25 MCG (1000 UT) tablet, Take 1,000 Units by mouth daily., Disp: , Rfl:  .  denosumab (PROLIA) 60 MG/ML SOSY injection, Inject 60 mg into the skin every 6 (six) months., Disp: , Rfl:  .  guaiFENesin (MUCINEX) 600 MG 12 hr tablet, Take 1 tablet (600 mg total) by mouth 2 (two) times daily., Disp: 30 tablet, Rfl: 0 .  sertraline (ZOLOFT) 100 MG tablet, Take 100 mg by mouth 2 (two) times a day. , Disp: , Rfl:  .  Tiotropium Bromide-Olodaterol (STIOLTO RESPIMAT) 2.5-2.5 MCG/ACT AERS, Inhale 2 puffs into the lungs daily., Disp: 4 g, Rfl: 11 .  vitamin B-12 (CYANOCOBALAMIN) 500 MCG tablet, Take 500 mcg by mouth daily., Disp: , Rfl:  .  amoxicillin (AMOXIL) 500 MG tablet, TAKE 4 TABLETS 1 HOUR BEFORE DENTAL EXAMINATION, Disp: 4 tablet, Rfl: 2 .  telmisartan (MICARDIS) 40 MG tablet, Take 1 tablet (40 mg total) by mouth daily., Disp: 30 tablet, Rfl: 11  Facility-Administered Medications Ordered in Other Encounters:    .  sodium chloride flush (NS) 0.9 % injection 3 mL, 3 mL, Intravenous, Q12H, Larey Dresser, MD   No Known Allergies  Past Medical History:  Diagnosis Date  . Anemia    "several times over the years" (10/17/2012)  . Anxiety   . Breast cancer ([place]) 02/2010   right breast  . Carcinoma of breast treated with adjuvant hormone therapy ([place]) 9/11   Femara  . CHF (congestive heart failure) ([place])   . COPD (chronic obstructive pulmonary disease) ([place])   . Depression   . Exertional shortness of breath    "recently" (10/17/2012)  . GERD (gastroesophageal reflux disease)    diet controlled, no med  . Hollenhorst plaque, left eye    apparently resolved at recent ophthalmology visit  . Hyperlipidemia   . Hypertension   . 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 Left ventricular dysfunction   . Moderate aortic insufficiency   . Pneumonia    "that's why I'm here" (10/17/2012)  . S/P AVR (aortic valve replacement) 04/26/2019     Past Surgical History:  Procedure Laterality Date  . AORTIC VALVE REPLACEMENT N/A 01/31/2019   Procedure: AORTIC VALVE REPLACEMENT (AVR) USING 21 MM INSPIRIS RESILIS AORTIC VALVE. SN: [id number];  Surgeon: Prescott Gum, Collier Salina, MD;  Location: [facility];  Service: Open Heart Surgery;  Laterality: N/A;  . APPENDECTOMY  1974  . BREAST BIOPSY Right 02/27/10  Needle core Biopsy; Invasive Mammary; ER/PR Positive, Her-2 Neu negative, Ki-67 22%  . BREAST LUMPECTOMY WITH SENTINEL LYMPH NODE BIOPSY Right 07/03/11   Invasive Ductal Carcinoma;0/3 nodes negative,; ER,PR Positive, Her-2 Neg; Ki-67 22%  . [place]; 1974  . COLONOSCOPY    . DILATION AND CURETTAGE OF UTERUS  1970's  . RIGHT/LEFT HEART CATH AND CORONARY ANGIOGRAPHY N/A 01/02/2019   Procedure: RIGHT/LEFT HEART CATH AND CORONARY ANGIOGRAPHY;  Surgeon: Larey Dresser, MD;  Location: [place] [facility];  Service: Cardiovascular;  Laterality: N/A;  . TEE WITHOUT CARDIOVERSION N/A 01/31/2019   Procedure:  TRANSESOPHAGEAL ECHOCARDIOGRAM (TEE);  Surgeon: Prescott Gum, Collier Salina, MD;  Location: [place] [facility];  Service: Open Heart Surgery;  Laterality: N/A;  . TUBAL LIGATION  1974  . WISDOM TOOTH EXTRACTION      Family History  Problem Relation Age of Onset  . Heart disease Mother   . Cancer Father   . Heart disease Maternal Grandmother   . Heart disease Maternal Grandfather   . Cancer Paternal Grandmother   . Lung disease Neg Hx     Social History   Tobacco Use  . Smoking status: Former Smoker    Packs/day: 3.00    Years: 30.00    Pack years: 90.00    Types: Cigarettes    Start date: 05/19/1958    Quit date: 06/30/1990    Years since quitting: 29.6  . Smokeless tobacco: Never Used  . Tobacco comment: Stopped smoking for 2-3 years during second child.   Vaping Use  . Vaping Use: Never used  Substance Use Topics  . Alcohol use: Yes    Comment: 1-2 glasses wine/month  . Drug use: No    ROS   Objective:   Vitals: BP 104/72   Pulse (!) 112   Temp 98.1 F (36.7 C)   Resp 18   LMP  (LMP Unknown)   SpO2 95%   Physical Exam Constitutional:      General: She is not in acute distress.    Appearance: Normal appearance. She is well-developed. She is not ill-appearing, toxic-appearing or diaphoretic.  HENT:     Head: Normocephalic and atraumatic.     Right Ear: External ear normal.     Left Ear: External ear normal.     Nose: Nose normal.     Mouth/Throat:     Mouth: Mucous membranes are moist.     Pharynx: Oropharynx is clear.  Eyes:     General: No scleral icterus.       Right eye: No discharge.        Left eye: No discharge.     Extraocular Movements: Extraocular movements intact.     Conjunctiva/sclera: Conjunctivae normal.     Pupils: Pupils are equal, round, and reactive to light.  Cardiovascular:     Rate and Rhythm: Regular rhythm. Tachycardia present.     Pulses: Normal pulses.     Heart sounds: Normal heart sounds. No murmur heard.  No friction rub. No gallop.     Pulmonary:     Effort: Pulmonary effort is normal. No respiratory distress.     Breath sounds: Normal breath sounds. No stridor. No wheezing, rhonchi or rales.  Musculoskeletal:     Right lower leg: No edema.     Left lower leg: No edema.  Skin:    General: Skin is warm and dry.     Findings: No rash.  Neurological:     Mental Status: She is alert  and oriented to person, place, and time.     Cranial Nerves: No cranial nerve deficit.  Psychiatric:        Mood and Affect: Mood normal.        Behavior: Behavior normal.        Thought Content: Thought content normal.        Judgment: Judgment normal.     ED ECG REPORT   Date: 02/18/2020  Rate: 152bpm  Rhythm: supraventricular tachycardia (SVT)  QRS Axis: normal  Intervals: normal  ST/T Wave abnormalities: nonspecific T wave changes and nonspecific ST/T changes  Conduction Disutrbances:none  Narrative Interpretation: New onset supraventricular tachycardia at 152 bpm, starkly different from previous EKGs.  He also has ST/T wave abnormalities in lead II, 3 and T wave inversion in lead V5, V6.  Old EKG Reviewed: changes noted  I have personally reviewed the EKG tracing and agree with the computerized printout as noted.   Assessment and Plan :   PDMP not reviewed this encounter.  1. Supraventricular tachycardia ([place])   2. Nonspecific abnormal electrocardiogram (ECG) (EKG)   3. Weakness   4. H/O aortic valve insufficiency   5. H/O aortic valve replacement   6. History of atrial fibrillation   7. History of congestive heart failure     EMS arrived, report given.  Patient is to be transported to [facility] for emergent intervention and work-up of her new onset SVT.  Patient is in agreement with treatment plan.   Jaynee Eagles, PA-C 02/18/20 1512

## 2020-02-18 NOTE — ED Triage Notes (Signed)
Pt presents with complaints of low blood pressure in 68/40 at home. Pt states she had heart surgery in September and has been out of the Mucinex they prescribed her. Reports that since being out of that medication x 1 week she has been feeling off. Reports dizziness, generalized weakness, and palpitations. Palpitations have subsided.

## 2020-02-18 NOTE — ED Notes (Signed)
Report called to [facility], Agricultural consultant in ED.

## 2020-02-18 NOTE — ED Triage Notes (Signed)
thye pt arrived by [facility] from u[facility]  The pt has not been feeling well for 2-3 days c/o sob and dizziness  More  Dizziness  Today  Iv per ems   Svt vs af according to the ems they gave the pt cardiz  m 10mg  535ml nss  Heart rate 70-80 af at present   No chest pain lungs clear  She had af in September after getting a heart valve replaced

## 2020-02-18 NOTE — ED Notes (Signed)
nsr on moonitor

## 2020-02-18 NOTE — ED Notes (Signed)
Patient is being discharged from the Urgent Care and sent to the Emergency Department via EMS . Per [place], [place], patient is in need of higher level of care due to needing higher level of care. Patient is aware and verbalizes understanding of plan of care.  Vitals:   02/18/20 1453  BP: 104/72  Pulse: (!) 112  Resp: 18  Temp: 98.1 F (36.7 C)  SpO2: 95%

## 2020-02-18 NOTE — Discharge Instructions (Addendum)
Please follow-up with your primary care doctor within 1 week to have your  kidney function (creatinine) rechecked it was slightly elevated today.  You should increase your daily water intake to try to stay well-hydrated.  Referral has been sent to the A. fib clinic.  They should be contacting you within 1 week to schedule an appointment.  If you not hear from them please call the office to schedule.  Return to the emergency department for any new or worsening symptoms include shortness of breath, chest pain or palpitations, feeling like your heart is racing, low blood pressure under 855 systolic.  Thank you for allowing [place] to be part of your care.

## 2020-02-21 ENCOUNTER — Other Ambulatory Visit: Payer: Self-pay

## 2020-02-21 ENCOUNTER — Encounter (HOSPITAL_COMMUNITY): Payer: Self-pay | Admitting: Nurse Practitioner

## 2020-02-21 ENCOUNTER — Ambulatory Visit (HOSPITAL_COMMUNITY)
Admission: RE | Admit: 2020-02-21 | Discharge: 2020-02-21 | Disposition: A | Discharge status: Home / Self Care | Payer: Medicare Other | Source: Ambulatory Visit | Attending: Nurse Practitioner | Admitting: Nurse Practitioner

## 2020-02-21 VITALS — BP 116/58 | HR 74 | Ht 63.0 in | Wt 127.0 lb

## 2020-02-21 DIAGNOSIS — F419 Anxiety disorder, unspecified: Secondary | ICD-10-CM | POA: Insufficient documentation

## 2020-02-21 DIAGNOSIS — I480 Paroxysmal atrial fibrillation: Secondary | ICD-10-CM | POA: Diagnosis not present

## 2020-02-21 DIAGNOSIS — I110 Hypertensive heart disease with heart failure: Secondary | ICD-10-CM | POA: Insufficient documentation

## 2020-02-21 DIAGNOSIS — Z79899 Other long term (current) drug therapy: Secondary | ICD-10-CM | POA: Diagnosis not present

## 2020-02-21 DIAGNOSIS — Z87891 Personal history of nicotine dependence: Secondary | ICD-10-CM | POA: Insufficient documentation

## 2020-02-21 DIAGNOSIS — J449 Chronic obstructive pulmonary disease, unspecified: Secondary | ICD-10-CM | POA: Insufficient documentation

## 2020-02-21 DIAGNOSIS — F329 Major depressive disorder, single episode, unspecified: Secondary | ICD-10-CM | POA: Diagnosis not present

## 2020-02-21 DIAGNOSIS — E785 Hyperlipidemia, unspecified: Secondary | ICD-10-CM | POA: Diagnosis not present

## 2020-02-21 DIAGNOSIS — Z7901 Long term (current) use of anticoagulants: Secondary | ICD-10-CM | POA: Insufficient documentation

## 2020-02-21 DIAGNOSIS — D6869 Other thrombophilia: Secondary | ICD-10-CM

## 2020-02-21 DIAGNOSIS — I509 Heart failure, unspecified: Secondary | ICD-10-CM | POA: Insufficient documentation

## 2020-02-21 MED ORDER — APIXABAN 5 MG PO TABS: 5.0000 mg | ORAL_TABLET | Freq: Two times a day (BID) | ORAL | 3 refills | Status: DC

## 2020-02-21 MED ORDER — DILTIAZEM HCL 30 MG PO TABS: ORAL_TABLET | ORAL | 1 refills | Status: DC

## 2020-02-21 NOTE — Progress Notes (Addendum)
Primary Care Physician: Deland Pretty, MD Referring Physician: [facility] ER f/u  Cardiologist: Dr. Jeannette How Dana Pruitt is a 79 y.o. female with a h/o breat CA, CHF, COPD, GERD and  AV disease with AV replacement with a 21 mm Inspiris Edwards valve 01/31/19. She had afib at time of surgery and  was on amiodarone with warfarin for a period of time. She has to go to the ER for afib with RVR. She converted spontaneously in the ER. She noted one brief episode at home yesterday. In the office, ekg shows SR. She is not on anticoagulation with a CHA2DS2VASc score of 5. She does not smoke no significant  caffeine or alcohol use. No sleep apnea symptoms. No bleeding history.   Today, she denies symptoms of palpitations, chest pain, shortness of breath, orthopnea, PND, lower extremity edema, dizziness, presyncope, syncope, or neurologic sequela. The patient is tolerating medications without difficulties and is otherwise without complaint today.   Past Medical History:  Diagnosis Date  . Anemia    "several times over the years" (10/17/2012)  . Anxiety   . Breast cancer ([place]) 02/2010   right breast  . Carcinoma of breast treated with adjuvant hormone therapy ([place]) 9/11   Femara  . CHF (congestive heart failure) ([place])   . COPD (chronic obstructive pulmonary disease) ([place])   . Depression   . Exertional shortness of breath    "recently" (10/17/2012)  . GERD (gastroesophageal reflux disease)    diet controlled, no med  . Hollenhorst plaque, left eye    apparently resolved at recent ophthalmology visit  . Hyperlipidemia   . Hypertension   . 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 Left ventricular dysfunction   . Moderate aortic insufficiency   . Pneumonia    "that's why I'm here" (10/17/2012)  . S/P AVR (aortic valve replacement) 04/26/2019   Past Surgical History:  Procedure Laterality Date  . AORTIC VALVE REPLACEMENT N/A 01/31/2019   Procedure: AORTIC VALVE REPLACEMENT (AVR) USING 21 MM  INSPIRIS RESILIS AORTIC VALVE. SN: [id number];  Surgeon: Prescott Gum, Collier Salina, MD;  Location: [facility];  Service: Open Heart Surgery;  Laterality: N/A;  . APPENDECTOMY  1974  . BREAST BIOPSY Right 02/27/10   Needle core Biopsy; Invasive Mammary; ER/PR Positive, Her-2 Neu negative, Ki-67 22%  . BREAST LUMPECTOMY WITH SENTINEL LYMPH NODE BIOPSY Right 07/03/11   Invasive Ductal Carcinoma;0/3 nodes negative,; ER,PR Positive, Her-2 Neg; Ki-67 22%  . [place]; 1974  . COLONOSCOPY    . DILATION AND CURETTAGE OF UTERUS  1970's  . RIGHT/LEFT HEART CATH AND CORONARY ANGIOGRAPHY N/A 01/02/2019   Procedure: RIGHT/LEFT HEART CATH AND CORONARY ANGIOGRAPHY;  Surgeon: Larey Dresser, MD;  Location: [facility];  Service: Cardiovascular;  Laterality: N/A;  . TEE WITHOUT CARDIOVERSION N/A 01/31/2019   Procedure: TRANSESOPHAGEAL ECHOCARDIOGRAM (TEE);  Surgeon: Prescott Gum, Collier Salina, MD;  Location: [facility];  Service: Open Heart Surgery;  Laterality: N/A;  . TUBAL LIGATION  1974  . WISDOM TOOTH EXTRACTION      Current Outpatient Medications  Medication Sig Dispense Refill  . amoxicillin (AMOXIL) 500 MG tablet TAKE 4 TABLETS 1 HOUR BEFORE DENTAL EXAMINATION 4 tablet 2  . atorvastatin (LIPITOR) 20 MG tablet Take 20 mg by mouth daily.     . carvedilol (COREG) 12.5 MG tablet TAKE 1 TABLET(12.5 MG) BY MOUTH TWICE DAILY WITH A MEAL 60 tablet 0  . cholecalciferol (VITAMIN D3) 25 MCG (1000 UT) tablet  Take 1,000 Units by mouth daily.    [name] denosumab (PROLIA) 60 MG/ML SOSY injection Inject 60 mg into the skin every 6 (six) months.    . fluticasone (FLONASE) 50 MCG/ACT nasal spray Place 2 sprays into both nostrils daily.    [name] guaiFENesin (MUCINEX) 600 MG 12 hr tablet Take 1 tablet (600 mg total) by mouth 2 (two) times daily. 30 tablet 0  . sertraline (ZOLOFT) 100 MG tablet Take 100 mg by mouth 2 (two) times a day.     . Tiotropium Bromide-Olodaterol (STIOLTO RESPIMAT) 2.5-2.5 MCG/ACT AERS Inhale 2 puffs into the lungs  daily. 4 g 11  . vitamin B-12 (CYANOCOBALAMIN) 500 MCG tablet Take 500 mcg by mouth daily.    [name] apixaban (ELIQUIS) 5 MG TABS tablet Take 1 tablet (5 mg total) by mouth 2 (two) times daily. 60 tablet 3  . diltiazem (CARDIZEM) 30 MG tablet Take 1 tablet every 4 hours AS NEEDED for AFIB heart rate >100 45 tablet 1   No current facility-administered medications for this encounter.   Facility-Administered Medications Ordered in Other Encounters  Medication Dose Route Frequency Provider Last Rate Last Admin  . sodium chloride flush (NS) 0.9 % injection 3 mL  3 mL Intravenous Q12H Larey Dresser, MD        No Known Allergies  Social History   Socioeconomic History  . Marital status: Married    Spouse name: Not on file  . Number of children: 2  . Years of education: college  . Highest education level: Not on file  Occupational History    Employer: RETIRED  Tobacco Use  . Smoking status: Former Smoker    Packs/day: 3.00    Years: 30.00    Pack years: 90.00    Types: Cigarettes    Start date: 05/19/1958    Quit date: 06/30/1990    Years since quitting: 29.6  . Smokeless tobacco: Never Used  . Tobacco comment: Stopped smoking for 2-3 years during second child.   Vaping Use  . Vaping Use: Never used  Substance and Sexual Activity  . Alcohol use: Yes    Comment: 1-2 glasses wine/month  . Drug use: No  . Sexual activity: Yes    Birth control/protection: Post-menopausal    Comment: Menarche age 38, G31,P2, Menopause in 28"s, No HRT Married  Other Topics Concern  . Not on file  Social History Narrative   [facility] (12/29/16):   Originally from [place], [place]. Moved to [place] at 79 y.o. Does have a dog. Hasn't worked outside the home. Remote parakeet exposure from her children. No mold or hot tub exposure. Enjoys doing yard work & walking.    Social Determinants of Health   Financial Resource Strain:   . Difficulty of Paying Living Expenses: Not on file  Food Insecurity:   .  Worried About Charity fundraiser in the Last Year: Not on file  . Ran Out of Food in the Last Year: Not on file  Transportation Needs:   . Lack of Transportation (Medical): Not on file  . Lack of Transportation (Non-Medical): Not on file  Physical Activity:   . Days of Exercise per Week: Not on file  . Minutes of Exercise per Session: Not on file  Stress:   . Feeling of Stress : Not on file  Social Connections:   . Frequency of Communication with Friends and Family: Not on file  . Frequency of Social Gatherings with Friends and Family: Not on file  .  Attends Religious Services: Not on file  . Active Member of Clubs or Organizations: Not on file  . Attends Archivist Meetings: Not on file  . Marital Status: Not on file  Intimate Partner Violence:   . Fear of Current or Ex-Partner: Not on file  . Emotionally Abused: Not on file  . Physically Abused: Not on file  . Sexually Abused: Not on file    Family History  Problem Relation Age of Onset  . Heart disease Mother   . Cancer Father   . Heart disease Maternal Grandmother   . Heart disease Maternal Grandfather   . Cancer Paternal Grandmother   . Lung disease Neg Hx     ROS- All systems are reviewed and negative except as per the HPI above  Physical Exam: Vitals:   02/21/20 1445  BP: (!) 116/58  Pulse: 74  Weight: 57.6 kg  Height: 5' 3"  (1.6 m)   Wt Readings from Last 3 Encounters:  02/21/20 57.6 kg  02/18/20 55.3 kg  10/23/19 55.3 kg    Labs: Lab Results  Component Value Date   NA 138 02/18/2020   K 4.2 02/18/2020   CL 106 02/18/2020   CO2 23 02/18/2020   GLUCOSE 106 (H) 02/18/2020   BUN 20 02/18/2020   CREATININE 1.16 (H) 02/18/2020   CALCIUM 9.4 02/18/2020   MG 1.9 02/18/2020   Lab Results  Component Value Date   INR 1.9 (A) 04/25/2019   Lab Results  Component Value Date   CHOL 140 12/17/2018   HDL 38 (L) 12/17/2018   LDLCALC 81 12/17/2018   TRIG 103 12/17/2018     GEN- The patient  is well appearing, alert and oriented x 3 today.   Head- normocephalic, atraumatic Eyes-  Sclera clear, conjunctiva pink Ears- hearing intact Oropharynx- clear Neck- supple, no JVP Lymph- no cervical lymphadenopathy Lungs- Clear to ausculation bilaterally, normal work of breathing Heart- Regular rate and rhythm, no murmurs, rubs or gallops, PMI not laterally displaced GI- soft, NT, ND, + BS Extremities- no clubbing, cyanosis, or edema MS- no significant deformity or atrophy Skin- no rash or lesion Psych- euthymic mood, full affect Neuro- strength and sensation are intact  EKG- NSR at 74 bpm, pr int  152 ms, qrs int 86 ms, qtc 319 ms  EPic records reviewed  Echo- 1. Left ventricular ejection fraction, by estimation, is 55 to 60%. The  left ventricle has normal function. The left ventricle has no regional  wall motion abnormalities. There is mild concentric left ventricular  hypertrophy. Left ventricular diastolic  parameters were normal.  2. Right ventricular systolic function is mildly reduced. The right  ventricular size is normal. There is normal pulmonary artery systolic  pressure.  3. The mitral valve is normal in structure. Trivial mitral valve  regurgitation. No evidence of mitral stenosis.  4. The aortic valve has been repaired/replaced. Aortic valve  regurgitation is not visualized. There is a 21 mm Inspiris Resilis Aortic  Valve valve present in the aortic position. Procedure Date: 01/31/2019.  Echo findings are consistent with normal  structure and function of the aortic valve prosthesis.  5. The inferior vena cava is normal in size with greater than 50%  respiratory variability, suggesting right atrial pressure of 3 mmHg.   Assessment and Plan: 1. Paroxysmal  afib General education re afib  Triggers discussed, no triggers identified Will order 30 mg cardizem with instructions how to use  for breakthrough afib  If afib burden escalates,  will need to discuss  antiarrythmic's  2. CHA2DS2VASc score of 5  Will start eliquis 5 mg bid ( less than 54 yo, weight  less than  60 kg but creatinine is less thatn 1.5)  30 day free card given  Bleeding precautions discussed  Stop asa   Will see back in 3 weeks with repeat CBC/bmet  Butch Penny C. Malynn Lucy, [facility] [address] [place], [place] [address] [phone]

## 2020-02-21 NOTE — Patient Instructions (Signed)
Stop aspirin  Start Eliquis 5mg twice a day   Cardizem 30mg -- take 1 tablet every 4 hours AS NEEDED for AFIB heart rate >100 as long as top number of blood pressure >100.  

## 2020-03-07 DIAGNOSIS — I10 Essential (primary) hypertension: Secondary | ICD-10-CM | POA: Diagnosis not present

## 2020-03-07 DIAGNOSIS — I959 Hypotension, unspecified: Secondary | ICD-10-CM | POA: Diagnosis not present

## 2020-03-07 DIAGNOSIS — Z23 Encounter for immunization: Secondary | ICD-10-CM | POA: Diagnosis not present

## 2020-03-13 ENCOUNTER — Encounter (HOSPITAL_COMMUNITY): Payer: Self-pay | Admitting: Nurse Practitioner

## 2020-03-13 ENCOUNTER — Other Ambulatory Visit: Payer: Self-pay

## 2020-03-13 ENCOUNTER — Ambulatory Visit (HOSPITAL_COMMUNITY)
Admission: RE | Admit: 2020-03-13 | Discharge: 2020-03-13 | Disposition: A | Discharge status: Home / Self Care | Payer: Medicare Other | Source: Ambulatory Visit | Attending: Nurse Practitioner | Admitting: Nurse Practitioner

## 2020-03-13 VITALS — BP 106/68 | HR 79 | Ht 63.0 in | Wt 122.8 lb

## 2020-03-13 DIAGNOSIS — I10 Essential (primary) hypertension: Secondary | ICD-10-CM | POA: Insufficient documentation

## 2020-03-13 DIAGNOSIS — Z8249 Family history of ischemic heart disease and other diseases of the circulatory system: Secondary | ICD-10-CM | POA: Diagnosis not present

## 2020-03-13 DIAGNOSIS — E785 Hyperlipidemia, unspecified: Secondary | ICD-10-CM | POA: Insufficient documentation

## 2020-03-13 DIAGNOSIS — F419 Anxiety disorder, unspecified: Secondary | ICD-10-CM | POA: Insufficient documentation

## 2020-03-13 DIAGNOSIS — I480 Paroxysmal atrial fibrillation: Secondary | ICD-10-CM

## 2020-03-13 DIAGNOSIS — D6869 Other thrombophilia: Secondary | ICD-10-CM | POA: Diagnosis not present

## 2020-03-13 DIAGNOSIS — R55 Syncope and collapse: Secondary | ICD-10-CM | POA: Insufficient documentation

## 2020-03-13 DIAGNOSIS — Z87891 Personal history of nicotine dependence: Secondary | ICD-10-CM | POA: Insufficient documentation

## 2020-03-13 DIAGNOSIS — Z7901 Long term (current) use of anticoagulants: Secondary | ICD-10-CM | POA: Insufficient documentation

## 2020-03-13 DIAGNOSIS — J449 Chronic obstructive pulmonary disease, unspecified: Secondary | ICD-10-CM | POA: Insufficient documentation

## 2020-03-13 DIAGNOSIS — Z79899 Other long term (current) drug therapy: Secondary | ICD-10-CM | POA: Diagnosis not present

## 2020-03-13 LAB — BASIC METABOLIC PANEL
Anion gap: 9 (ref 5–15)
BUN: 17 mg/dL (ref 8–23)
CO2: 24 mmol/L (ref 22–32)
Calcium: 10.1 mg/dL (ref 8.9–10.3)
Chloride: 108 mmol/L (ref 98–111)
Creatinine, Ser: 1.25 mg/dL — ABNORMAL HIGH (ref 0.44–1.00)
GFR calc Af Amer: 48 mL/min — ABNORMAL LOW (ref >60)
GFR calc non Af Amer: 41 mL/min — ABNORMAL LOW (ref >60)
Glucose, Bld: 179 mg/dL — ABNORMAL HIGH (ref 70–99)
Potassium: 4.4 mmol/L (ref 3.5–5.1)
Sodium: 141 mmol/L (ref 135–145)

## 2020-03-13 LAB — CBC
HCT: 40.1 % (ref 36.0–46.0)
Hemoglobin: 13.1 g/dL (ref 12.0–15.0)
MCH: 32 pg (ref 26.0–34.0)
MCHC: 32.7 g/dL (ref 30.0–36.0)
MCV: 98 fL (ref 80.0–100.0)
Platelets: 217 10*3/uL (ref 150–400)
RBC: 4.09 MIL/uL (ref 3.87–5.11)
RDW: 13.6 % (ref 11.5–15.5)
WBC: 5.7 10*3/uL (ref 4.0–10.5)
nRBC: 0 % (ref 0.0–0.2)

## 2020-03-13 MED ORDER — CARVEDILOL 6.25 MG PO TABS: 9.3750 mg | ORAL_TABLET | Freq: Two times a day (BID) | ORAL | 3 refills | Status: DC

## 2020-03-13 NOTE — Progress Notes (Signed)
Primary Care Physician: Deland Pretty, MD Referring Physician: [facility] ER f/u  Cardiologist: Dr. Jeannette How JALISHA ENNEKING is a 79 y.o. female with a h/o breat CA, CHF, COPD, GERD and  AV disease with AV replacement with a 21 mm Inspiris Edwards valve 01/31/19. She had afib at time of surgery and  was on amiodarone with warfarin for a period of time. She has to go to the ER for afib with RVR. She converted spontaneously in the ER. She noted one brief episode at home yesterday. In the office, ekg shows SR. She is not on anticoagulation with a CHA2DS2VASc score of 5. She does not smoke no significant  caffeine or alcohol use. No sleep apnea symptoms. No bleeding history. She states that she had a near syncopal spell 2 morning's ago as she got out of bed and  took her med's without eating or drinking, and then walked the dog. She had to sit down in the kitchen floor for a few minutes until she felt  better. She also reports soft BP's and frequent feeling of being lightheaded without afib present.   Today, she denies symptoms of palpitations, chest pain, shortness of breath, orthopnea, PND, lower extremity edema, dizziness, presyncope, syncope, or neurologic sequela. The patient is tolerating medications without difficulties and is otherwise without complaint today.   Past Medical History:  Diagnosis Date  . Anemia    "several times over the years" (10/17/2012)  . Anxiety   . Breast cancer ([place]) 02/2010   right breast  . Carcinoma of breast treated with adjuvant hormone therapy ([place]) 9/11   Femara  . CHF (congestive heart failure) ([place])   . COPD (chronic obstructive pulmonary disease) ([place])   . Depression   . Exertional shortness of breath    "recently" (10/17/2012)  . GERD (gastroesophageal reflux disease)    diet controlled, no med  . Hollenhorst plaque, left eye    apparently resolved at recent ophthalmology visit  . Hyperlipidemia   . Hypertension   . IDC, RightStage II, Receptor positive  02/27/2010  . Left ventricular dysfunction   . Moderate aortic insufficiency   . Pneumonia    "that's why I'm here" (10/17/2012)  . S/P AVR (aortic valve replacement) 04/26/2019   Past Surgical History:  Procedure Laterality Date  . AORTIC VALVE REPLACEMENT N/A 01/31/2019   Procedure: AORTIC VALVE REPLACEMENT (AVR) USING 21 MM INSPIRIS RESILIS AORTIC VALVE. SN: [id number];  Surgeon: Prescott Gum, Collier Salina, MD;  Location: [facility];  Service: Open Heart Surgery;  Laterality: N/A;  . APPENDECTOMY  1974  . BREAST BIOPSY Right 02/27/10   Needle core Biopsy; Invasive Mammary; ER/PR Positive, Her-2 Neu negative, Ki-67 22%  . BREAST LUMPECTOMY WITH SENTINEL LYMPH NODE BIOPSY Right 07/03/11   Invasive Ductal Carcinoma;0/3 nodes negative,; ER,PR Positive, Her-2 Neg; Ki-67 22%  . [place]; 1974  . COLONOSCOPY    . DILATION AND CURETTAGE OF UTERUS  1970's  . RIGHT/LEFT HEART CATH AND CORONARY ANGIOGRAPHY N/A 01/02/2019   Procedure: RIGHT/LEFT HEART CATH AND CORONARY ANGIOGRAPHY;  Surgeon: Larey Dresser, MD;  Location: [facility];  Service: Cardiovascular;  Laterality: N/A;  . TEE WITHOUT CARDIOVERSION N/A 01/31/2019   Procedure: TRANSESOPHAGEAL ECHOCARDIOGRAM (TEE);  Surgeon: Prescott Gum, Collier Salina, MD;  Location: [facility];  Service: Open Heart Surgery;  Laterality: N/A;  . TUBAL LIGATION  1974  . WISDOM TOOTH EXTRACTION      Current Outpatient Medications  Medication Sig Dispense Refill  .  amoxicillin (AMOXIL) 500 MG tablet TAKE 4 TABLETS 1 HOUR BEFORE DENTAL EXAMINATION 4 tablet 2  . apixaban (ELIQUIS) 5 MG TABS tablet Take 1 tablet (5 mg total) by mouth 2 (two) times daily. 60 tablet 3  . atorvastatin (LIPITOR) 20 MG tablet Take 20 mg by mouth daily.     . carvedilol (COREG) 6.25 MG tablet Take 1.5 tablets (9.375 mg total) by mouth 2 (two) times daily with a meal. 90 tablet 3  . cholecalciferol (VITAMIN D3) 25 MCG (1000 UT) tablet Take 1,000 Units by mouth daily.    [name] denosumab (PROLIA) 60 MG/ML  SOSY injection Inject 60 mg into the skin every 6 (six) months.    . diltiazem (CARDIZEM) 30 MG tablet Take 1 tablet every 4 hours AS NEEDED for AFIB heart rate >100 45 tablet 1  . fluticasone (FLONASE) 50 MCG/ACT nasal spray Place 2 sprays into both nostrils daily.    [name] guaiFENesin (MUCINEX) 600 MG 12 hr tablet Take 1 tablet (600 mg total) by mouth 2 (two) times daily. 30 tablet 0  . sertraline (ZOLOFT) 100 MG tablet Taking by mouth 164m in the am and 537min the pm    . Tiotropium Bromide-Olodaterol (STIOLTO RESPIMAT) 2.5-2.5 MCG/ACT AERS Inhale 2 puffs into the lungs daily. 4 g 11  . vitamin B-12 (CYANOCOBALAMIN) 500 MCG tablet Take 500 mcg by mouth daily.     No current facility-administered medications for this encounter.   Facility-Administered Medications Ordered in Other Encounters  Medication Dose Route Frequency Provider Last Rate Last Admin  . sodium chloride flush (NS) 0.9 % injection 3 mL  3 mL Intravenous Q12H McLarey DresserMD        No Known Allergies  Social History   Socioeconomic History  . Marital status: Married    Spouse name: Not on file  . Number of children: 2  . Years of education: college  . Highest education level: Not on file  Occupational History    Employer: RETIRED  Tobacco Use  . Smoking status: Former Smoker    Packs/day: 3.00    Years: 30.00    Pack years: 90.00    Types: Cigarettes    Start date: 05/19/1958    Quit date: 06/30/1990    Years since quitting: 29.7  . Smokeless tobacco: Never Used  . Tobacco comment: Stopped smoking for 2-3 years during second child.   Vaping Use  . Vaping Use: Never used  Substance and Sexual Activity  . Alcohol use: Yes    Comment: 1-2 glasses wine/month  . Drug use: No  . Sexual activity: Yes    Birth control/protection: Post-menopausal    Comment: Menarche age 33G2G39,P2Menopause in [age]"sNo HRT Married  Other Topics Concern  . Not on file  Social History Narrative   [facility] (12/29/16):    [place] [age][place] Does have a dog. Hasn't worked outside the home. Remote parakeet exposure from her children. No mold or hot tub exposure. Enjoys doing yard work & walking.    Social Determinants of Health   Financial Resource Strain:   . Difficulty of Paying Living Expenses: Not on file  Food Insecurity:   . Worried About RuCharity fundraisern the Last Year: Not on file  . Ran Out of Food in the Last Year: Not on file  Transportation Needs:   . Lack of Transportation (Medical): Not on file  . Lack of Transportation (Non-Medical):  Not on file  Physical Activity:   . Days of Exercise per Week: Not on file  . Minutes of Exercise per Session: Not on file  Stress:   . Feeling of Stress : Not on file  Social Connections:   . Frequency of Communication with Friends and Family: Not on file  . Frequency of Social Gatherings with Friends and Family: Not on file  . Attends Religious Services: Not on file  . Active Member of Clubs or Organizations: Not on file  . Attends Archivist Meetings: Not on file  . Marital Status: Not on file  Intimate Partner Violence:   . Fear of Current or Ex-Partner: Not on file  . Emotionally Abused: Not on file  . Physically Abused: Not on file  . Sexually Abused: Not on file    Family History  Problem Relation Age of Onset  . Heart disease Mother   . Cancer Father   . Heart disease Maternal Grandmother   . Heart disease Maternal Grandfather   . Cancer Paternal Grandmother   . Lung disease Neg Hx     ROS- All systems are reviewed and negative except as per the HPI above  Physical Exam: Vitals:   03/13/20 1421  BP: 106/68  Pulse: 79  Weight: 55.7 kg  Height: 5' 3"  (1.6 m)   Wt Readings from Last 3 Encounters:  03/13/20 55.7 kg  02/21/20 57.6 kg  02/18/20 55.3 kg    Labs: Lab Results  Component Value Date   NA 138 02/18/2020   K 4.2 02/18/2020   CL 106 02/18/2020   CO2 23 02/18/2020    GLUCOSE 106 (H) 02/18/2020   BUN 20 02/18/2020   CREATININE 1.16 (H) 02/18/2020   CALCIUM 9.4 02/18/2020   MG 1.9 02/18/2020   Lab Results  Component Value Date   INR 1.9 (A) 04/25/2019   Lab Results  Component Value Date   CHOL 140 12/17/2018   HDL 38 (L) 12/17/2018   LDLCALC 81 12/17/2018   TRIG 103 12/17/2018     GEN- The patient is well appearing, alert and oriented x 3 today.   Head- normocephalic, atraumatic Eyes-  Sclera clear, conjunctiva pink Ears- hearing intact Oropharynx- clear Neck- supple, no JVP Lymph- no cervical lymphadenopathy Lungs- Clear to ausculation bilaterally, normal work of breathing Heart- Regular rate and rhythm, no murmurs, rubs or gallops, PMI not laterally displaced GI- soft, NT, ND, + BS Extremities- no clubbing, cyanosis, or edema MS- no significant deformity or atrophy Skin- no rash or lesion Psych- euthymic mood, full affect Neuro- strength and sensation are intact  EKG- NSR at 74 bpm, pr int  152 ms, qrs int 86 ms, qtc 319 ms  EPic records reviewed  Echo- 1. Left ventricular ejection fraction, by estimation, is 55 to 60%. The  left ventricle has normal function. The left ventricle has no regional  wall motion abnormalities. There is mild concentric left ventricular  hypertrophy. Left ventricular diastolic  parameters were normal.  2. Right ventricular systolic function is mildly reduced. The right  ventricular size is normal. There is normal pulmonary artery systolic  pressure.  3. The mitral valve is normal in structure. Trivial mitral valve  regurgitation. No evidence of mitral stenosis.  4. The aortic valve has been repaired/replaced. Aortic valve  regurgitation is not visualized. There is a 21 mm Inspiris Resilis Aortic  Valve valve present in the aortic position. Procedure Date: 01/31/2019.  Echo findings are consistent with normal  structure  and function of the aortic valve prosthesis.  5. The inferior vena cava is  normal in size with greater than 50%  respiratory variability, suggesting right atrial pressure of 3 mmHg.   Assessment and Plan: 1. Paroxysmal  afib Feels  that she had one short episode of afib, since I saw her last, that converted fairly quickly with 30 mg cardizem  If afib burden escalates, will need to discuss antiarrythmic's Continue  BB  2. CHA2DS2VASc score of 5  Eliquis 5 mg bid started one month ago  ( less than 4 yo, weight  less than  60 kg but creatinine is less thatn 1.5)  Bleeding precautions discussed  Off asa  Cbc/bmet today   3. Presyncopal spells Will lower carvedilol to 9.375 bid as this is the only drug she is on that would lower blood pressure  If more afib with lowering BB, then will have to discuss antiarrythmic's   F/u with Dr. [name] as scheduled 10/8 afib clinic as needed    Butch Penny C. Leeandre Nordling, [facility] [address] [place], [place] [address] [phone]

## 2020-03-13 NOTE — Patient Instructions (Addendum)
Decrease coreg (carvedilol) to 9.375mg  twice a day (1 and 1/2 tablets of the 6.25mg  twice a day)   Follow up with Dr. [name] as scheduled

## 2020-03-29 ENCOUNTER — Ambulatory Visit: Payer: Medicare Other | Admitting: Cardiovascular Disease

## 2020-03-29 ENCOUNTER — Other Ambulatory Visit: Payer: Self-pay

## 2020-03-29 ENCOUNTER — Encounter: Payer: Self-pay | Admitting: Cardiovascular Disease

## 2020-03-29 VITALS — BP 130/80 | HR 70 | Ht 64.0 in | Wt 121.4 lb

## 2020-03-29 DIAGNOSIS — Z952 Presence of prosthetic heart valve: Secondary | ICD-10-CM | POA: Diagnosis not present

## 2020-03-29 DIAGNOSIS — E782 Mixed hyperlipidemia: Secondary | ICD-10-CM

## 2020-03-29 DIAGNOSIS — I480 Paroxysmal atrial fibrillation: Secondary | ICD-10-CM

## 2020-03-29 DIAGNOSIS — I10 Essential (primary) hypertension: Secondary | ICD-10-CM

## 2020-03-29 DIAGNOSIS — I351 Nonrheumatic aortic (valve) insufficiency: Secondary | ICD-10-CM | POA: Diagnosis not present

## 2020-03-29 NOTE — Patient Instructions (Signed)
Testing/Procedures:  Your physician has requested that you have an echocardiogram. Echocardiography is a painless test that uses sound waves to create images of your heart. It provides your doctor with information about the size and shape of your heart and how well your heart's chambers and valves are working. This procedure takes approximately one hour. There are no restrictions for this procedure.[place]    Follow-Up: At [facility], you and your health needs are our priority.  As part of our continuing mission to provide you with exceptional heart care, we have created designated Provider Care Teams.  These Care Teams include your primary Cardiologist (physician) and Advanced Practice Providers (APPs -  Physician Assistants and Nurse Practitioners) who all work together to provide you with the care you need, when you need it.  We recommend signing up for the patient portal called "MyChart".  Sign up information is provided on this After Visit Summary.  MyChart is used to connect with patients for Virtual Visits (Telemedicine).  Patients are able to view lab/test results, encounter notes, upcoming appointments, etc.  Non-urgent messages can be sent to your provider as well.   To learn more about what you can do with MyChart, go to [email or web address].    Your next appointment:   3 month(s)  The format for your next appointment:   In Person  Provider:   Quay Burow, MD

## 2020-03-29 NOTE — Assessment & Plan Note (Signed)
History of perioperative A. fib at the time of her aortic valve replacement with recent recurrence resulting in a ER visit.  She finds spontaneously converted to sinus rhythm several hours after presenting to the ER.  She had another recurrent episode several days after that.  She has been started on Eliquis by Roderic Palau, NP in the A. fib clinic.  She is on carvedilol 9.75 mg p.o. twice daily, slightly reduced dose because of lightheadedness and has as needed Cardizem 30 mg rapid release for recurrent episodes.

## 2020-03-29 NOTE — Assessment & Plan Note (Signed)
History of severe aortic insufficiency status post aortic valve replacement by Dr. [name] using a 21 mm Inspiris Resilis  bioprosthetic valve 01/31/2019.  2D echocardiogram performed 10/11/2019 revealed normal LV systolic function with a well-functioning aortic bioprosthesis.  This will be repeated in 1 year.

## 2020-03-29 NOTE — Progress Notes (Signed)
03/29/2020 Dana Pruitt   10-30-1940  [id number]  Primary Physician Deland Pretty, MD Primary Cardiologist: Lorretta Harp MD [name], [place]  HPI:  Dana Pruitt is a 79 y.o.  thin-appearing married Caucasian female mother of 2, grandmother of 2 grandchildren whose mother Dana Pruitt was also a patient of mine and died at the age of 84. She was referred by Dr. Deland Pretty for cardiovascular evaluation because of echocardiogram performed 02/16/14 that showed moderate left ventricular dysfunction with an ejection fraction of 35-40% and moderate aortic insufficiency.  I last saw her in the office 04/25/2019.  Her cardiovascular risk factor profile is remarkable for remote tobacco abuse having quit in 1990 and treated hyperlipidemia. There is no family history of heart disease. She has never had a heart attack or stroke and denies chest pain or shortness of breath. She did have a Hollenhorst plaque documented by ophthalmologic exam several months ago. Carotid Dopplers performed since that time were normal. The plaque is since resolved. A 2-D echo was performed that showed an EF of 35-40% with moderate aortic insufficiency. LV size is normal and there was no LVH. She did have breast cancer documented by lumpectomy in 2013 and had chemotherapy and radiation therapy. Her oncologist was Dr. Letta Pate . She is on Femara . I began her on low-dose carvedilol and repeated her 2-D echocardiogram on 08/10/14 revealed improvement in her LV function up to 45-50%. She continues to have moderate to severe AI with normal LV size and function.. She only complains of mild dyspnea on exertion. Since I saw her year ago she should make remained clinically stable. She denies chest pain or shortness of breath  2D echo performed4/14/2020revealed EF of 40 to 45%with normal LV size with severe aortic insufficiency. She continues to deny symptoms of shortness of breath.  She was admitted in July with  chest pain and shortness of breath and underwent right left heart cath by Dr. Aundra Dubin revealing minimal CAD with severe AI.  She ultimately underwent bioprosthetic AVR by Dr. [name] and a follow-up admission 01/31/2019 with a 21 mm Inspira's Edwards bioprosthetic valve.  She was hospitalized for 10 days.  She continues to recuperate.  She denies chest pain or shortness of breath.  She remains on Coumadin anticoagulation because of preop A. fib which converted prior to discharge on amiodarone which has since been discontinued.  She is participating cardiac rehab.  She was on 1 L of O2 with exercise and at night which she was not on prior to admission I think can probably be discontinued.  Since I saw her a year ago she was seen in the emergency room for PAF which converted spontaneously several hours later.  She had a recurrent episode after that.  She saw Roderic Palau in the A. fib clinic 03/13/2020.  She was started on Eliquis oral anticoagulation and her carvedilol was down titrated because of dizziness.  Recent 2D echo performed 04/05/2019 revealed normal LV systolic function with a well-functioning aortic bioprosthesis.   Current Meds  Medication Sig  . amoxicillin (AMOXIL) 500 MG tablet TAKE 4 TABLETS 1 HOUR BEFORE DENTAL EXAMINATION  . apixaban (ELIQUIS) 5 MG TABS tablet Take 1 tablet (5 mg total) by mouth 2 (two) times daily.  [name] atorvastatin (LIPITOR) 20 MG tablet Take 20 mg by mouth daily.   . carvedilol (COREG) 6.25 MG tablet Take 1.5 tablets (9.375 mg total) by mouth 2 (two) times daily with a meal.  .  cholecalciferol (VITAMIN D3) 25 MCG (1000 UT) tablet Take 1,000 Units by mouth daily.  [name] denosumab (PROLIA) 60 MG/ML SOSY injection Inject 60 mg into the skin every 6 (six) months.  . diltiazem (CARDIZEM) 30 MG tablet Take 1 tablet every 4 hours AS NEEDED for AFIB heart rate >100  . fluticasone (FLONASE) 50 MCG/ACT nasal spray Place 2 sprays into both nostrils daily.  [name] guaiFENesin (MUCINEX)  600 MG 12 hr tablet Take 1 tablet (600 mg total) by mouth 2 (two) times daily.  . sertraline (ZOLOFT) 100 MG tablet Taking by mouth 100mg  in the am and 50mg  in the pm  . Tiotropium Bromide-Olodaterol (STIOLTO RESPIMAT) 2.5-2.5 MCG/ACT AERS Inhale 2 puffs into the lungs daily.  . vitamin B-12 (CYANOCOBALAMIN) 500 MCG tablet Take 500 mcg by mouth daily.     No Known Allergies  Social History   Socioeconomic History  . Marital status: Married    Spouse name: Not on file  . Number of children: 2  . Years of education: college  . Highest education level: Not on file  Occupational History    Employer: RETIRED  Tobacco Use  . Smoking status: Former Smoker    Packs/day: 3.00    Years: 30.00    Pack years: 90.00    Types: Cigarettes    Start date: 05/19/1958    Quit date: 06/30/1990    Years since quitting: 29.7  . Smokeless tobacco: Never Used  . Tobacco comment: Stopped smoking for 2-3 years during second child.   Vaping Use  . Vaping Use: Never used  Substance and Sexual Activity  . Alcohol use: Yes    Comment: 1-2 glasses wine/month  . Drug use: No  . Sexual activity: Yes    Birth control/protection: Post-menopausal    Comment: Menarche age 50, G55,P2, Menopause in 67"s, No HRT Married  Other Topics Concern  . Not on file  Social History Narrative   [facility] (12/29/16):   Originally from [place], [place]. Moved to [place] at 79 y.o. Does have a dog. Hasn't worked outside the home. Remote parakeet exposure from her children. No mold or hot tub exposure. Enjoys doing yard work & walking.    Social Determinants of Health   Financial Resource Strain:   . Difficulty of Paying Living Expenses: Not on file  Food Insecurity:   . Worried About Charity fundraiser in the Last Year: Not on file  . Ran Out of Food in the Last Year: Not on file  Transportation Needs:   . Lack of Transportation (Medical): Not on file  . Lack of Transportation (Non-Medical): Not on file  Physical  Activity:   . Days of Exercise per Week: Not on file  . Minutes of Exercise per Session: Not on file  Stress:   . Feeling of Stress : Not on file  Social Connections:   . Frequency of Communication with Friends and Family: Not on file  . Frequency of Social Gatherings with Friends and Family: Not on file  . Attends Religious Services: Not on file  . Active Member of Clubs or Organizations: Not on file  . Attends Archivist Meetings: Not on file  . Marital Status: Not on file  Intimate Partner Violence:   . Fear of Current or Ex-Partner: Not on file  . Emotionally Abused: Not on file  . Physically Abused: Not on file  . Sexually Abused: Not on file     Review of Systems: General: negative for chills,  fever, night sweats or weight changes.  Cardiovascular: negative for chest pain, dyspnea on exertion, edema, orthopnea, palpitations, paroxysmal nocturnal dyspnea or shortness of breath Dermatological: negative for rash Respiratory: negative for cough or wheezing Urologic: negative for hematuria Abdominal: negative for nausea, vomiting, diarrhea, bright red blood per rectum, melena, or hematemesis Neurologic: negative for visual changes, syncope, or dizziness All other systems reviewed and are otherwise negative except as noted above.    Blood pressure 130/80, pulse 70, height 5\' 4"  (1.626 m), weight 121 lb 6.4 oz (55.1 kg), SpO2 93 %.  General appearance: alert and no distress Neck: no adenopathy, no carotid bruit, no JVD, supple, symmetrical, trachea midline and thyroid not enlarged, symmetric, no tenderness/mass/nodules Lungs: clear to auscultation bilaterally Heart: regular rate and rhythm, S1, S2 normal, no murmur, click, rub or gallop Extremities: extremities normal, atraumatic, no cyanosis or edema Pulses: 2+ and symmetric Skin: Skin color, texture, turgor normal. No rashes or lesions Neurologic: Alert and oriented X 3, normal strength and tone. Normal symmetric  reflexes. Normal coordination and gait  EKG not performed today  ASSESSMENT AND PLAN:   HLD (hyperlipidemia) History of hyperlipidemia on atorvastatin with lipid profile performed 01/18/2020 revealing triglyceride 134, LDL 71 and HDL of 48.  Severe aortic regurgitation History of severe aortic insufficiency status post aortic valve replacement by Dr. [name] using a 21 mm Inspiris Resilis  bioprosthetic valve 01/31/2019.  2D echocardiogram performed 10/11/2019 revealed normal LV systolic function with a well-functioning aortic bioprosthesis.  This will be repeated in 1 year.  Atrial fibrillation ([place]) History of perioperative A. fib at the time of her aortic valve replacement with recent recurrence resulting in a ER visit.  She finds spontaneously converted to sinus rhythm several hours after presenting to the ER.  She had another recurrent episode several days after that.  She has been started on Eliquis by Roderic Palau, NP in the A. fib clinic.  She is on carvedilol 9.75 mg p.o. twice daily, slightly reduced dose because of lightheadedness and has as needed Cardizem 30 mg rapid release for recurrent episodes.  Essential hypertension History of essential hypertension with blood pressure measured today 130/80.  She is on carvedilol.      Lorretta Harp MD FACP,FACC,FAHA, [facility] 03/29/2020 3:02 PM

## 2020-03-29 NOTE — Assessment & Plan Note (Signed)
History of hyperlipidemia on atorvastatin with lipid profile performed 01/18/2020 revealing triglyceride 134, LDL 71 and HDL of 48.

## 2020-03-29 NOTE — Assessment & Plan Note (Signed)
History of essential hypertension with blood pressure measured today 130/80.  She is on carvedilol.

## 2020-04-06 ENCOUNTER — Ambulatory Visit: Payer: Medicare Other

## 2020-04-09 DIAGNOSIS — M810 Age-related osteoporosis without current pathological fracture: Secondary | ICD-10-CM | POA: Diagnosis not present

## 2020-04-18 ENCOUNTER — Other Ambulatory Visit (HOSPITAL_COMMUNITY): Payer: Medicare Other

## 2020-06-11 ENCOUNTER — Telehealth: Payer: Self-pay | Admitting: Internal Medicine

## 2020-06-11 MED ORDER — STIOLTO RESPIMAT 2.5-2.5 MCG/ACT IN AERS: 2.0000 | INHALATION_SPRAY | Freq: Every day | RESPIRATORY_TRACT | 11 refills | Status: DC

## 2020-06-11 NOTE — Telephone Encounter (Signed)
Rx for pt's Stiolto inhaler has been sent to preferred pharmacy for pt. Attempted to call pt but unable to reach. Left pt a detailed message on machine letting her know that the Rx had been refilled. Nothing further needed.

## 2020-06-12 ENCOUNTER — Other Ambulatory Visit (HOSPITAL_COMMUNITY): Payer: Self-pay | Admitting: Nurse Practitioner

## 2020-06-24 ENCOUNTER — Other Ambulatory Visit: Payer: Self-pay | Admitting: Cardiothoracic Surgery

## 2020-06-24 DIAGNOSIS — Z952 Presence of prosthetic heart valve: Secondary | ICD-10-CM

## 2020-07-05 ENCOUNTER — Encounter: Payer: Self-pay | Admitting: Cardiovascular Disease

## 2020-07-05 ENCOUNTER — Other Ambulatory Visit: Payer: Self-pay

## 2020-07-05 ENCOUNTER — Ambulatory Visit (INDEPENDENT_AMBULATORY_CARE_PROVIDER_SITE_OTHER): Payer: Medicare Other | Admitting: Cardiovascular Disease

## 2020-07-05 DIAGNOSIS — I351 Nonrheumatic aortic (valve) insufficiency: Secondary | ICD-10-CM

## 2020-07-05 DIAGNOSIS — I480 Paroxysmal atrial fibrillation: Secondary | ICD-10-CM

## 2020-07-05 DIAGNOSIS — E782 Mixed hyperlipidemia: Secondary | ICD-10-CM

## 2020-07-05 DIAGNOSIS — I10 Essential (primary) hypertension: Secondary | ICD-10-CM

## 2020-07-05 DIAGNOSIS — I519 Heart disease, unspecified: Secondary | ICD-10-CM

## 2020-07-05 NOTE — Assessment & Plan Note (Signed)
Status post bioprosthetic AVR by Dr. [name] 01/31/2019 with a 21 mm Inspira's Edwards bioprosthetic valve for severe AI and LV dysfunction. Her most recent 2D echo performed 10/11/2019 revealed normal LV systolic function and a well-functioning aortic bioprosthesis. This will be repeated this coming April.

## 2020-07-05 NOTE — Assessment & Plan Note (Signed)
History of essential hypertension blood pressure measured today 140/90. She is on carvedilol.

## 2020-07-05 NOTE — Assessment & Plan Note (Signed)
History of LV dysfunction with an EF in the 40 to 45% in the past. She underwent AVR 01/31/2019 with subsequent echo performed 10/11/2019 revealing normal LV systolic function.

## 2020-07-05 NOTE — Patient Instructions (Signed)
Medication Instructions:  Your physician recommends that you continue on your current medications as directed. Please refer to the Current Medication list given to you today.  *If you need a refill on your cardiac medications before your next appointment, please call your pharmacy*  Testing/Procedures: Your physician has requested that you have an echocardiogram. Echocardiography is a painless test that uses sound waves to create images of your heart. It provides your doctor with information about the size and shape of your heart and how well your heart's chambers and valves are working. This procedure takes approximately one hour. There are no restrictions for this procedure. [place] [place]   To be done in April 2022  Follow-Up: At [facility], you and your health needs are our priority.  As part of our continuing mission to provide you with exceptional heart care, we have created designated Provider Care Teams.  These Care Teams include your primary Cardiologist (physician) and Advanced Practice Providers (APPs -  Physician Assistants and Nurse Practitioners) who all work together to provide you with the care you need, when you need it.  We recommend signing up for the patient portal called "MyChart".  Sign up information is provided on this After Visit Summary.  MyChart is used to connect with patients for Virtual Visits (Telemedicine).  Patients are able to view lab/test results, encounter notes, upcoming appointments, etc.  Non-urgent messages can be sent to your provider as well.   To learn more about what you can do with MyChart, go to [email or web address].    Your next appointment:   12 month(s)  The format for your next appointment:   In Person  Provider:   Quay Burow, MD

## 2020-07-05 NOTE — Assessment & Plan Note (Signed)
History of PAF maintaining sinus rhythm on Eliquis oral anticoagulation. 

## 2020-07-05 NOTE — Progress Notes (Signed)
07/05/2020 Dana Pruitt   [birth year]  [id number]  Primary Physician Deland Pretty, MD Primary Cardiologist: Lorretta Harp MD [name], [place]  HPI:  Dana Pruitt is a 80 y.o.  thin-appearing married Caucasian female mother of 2, grandmother of 2 grandchildren whose mother Dana Pruitt was also a patient of mine and died at the age of 65. She was referred by Dr. Deland Pretty for cardiovascular evaluation because of echocardiogram performed 02/16/14 that showed moderate left ventricular dysfunction with an ejection fraction of 35-40% and moderate aortic insufficiency.  I last saw her in the office 03/29/2020.  Her cardiovascular risk factor profile is remarkable for remote tobacco abuse having quit in 1990 and treated hyperlipidemia. There is no family history of heart disease. She has never had a heart attack or stroke and denies chest pain or shortness of breath. She did have a Hollenhorst plaque documented by ophthalmologic exam several months ago. Carotid Dopplers performed since that time were normal. The plaque is since resolved. A 2-D echo was performed that showed an EF of 35-40% with moderate aortic insufficiency. LV size is normal and there was no LVH. She did have breast cancer documented by lumpectomy in 2013 and had chemotherapy and radiation therapy. Her oncologist was Dr. Letta Pate . She is on Femara . I began her on low-dose carvedilol and repeated her 2-D echocardiogram on 08/10/14 revealed improvement in her LV function up to 45-50%. She continues to have moderate to severe AI with normal LV size and function.. She only complains of mild dyspnea on exertion. Since I saw her year ago she should make remained clinically stable. She denies chest pain or shortness of breath  2D echo performed4/14/2020revealed EF of 40 to 45%with normal LV size with severe aortic insufficiency. She continues to deny symptoms of shortness of breath.  She was admitted in July with  chest pain and shortness of breath and underwent right left heart cath by Dr. Aundra Dubin revealing minimal CAD with severe AI. She ultimately underwent bioprosthetic AVR by Dr. [name] and a follow-up admission 01/31/2019 with a 21 mm Inspira's Edwards bioprosthetic valve. She was hospitalized for 10 days. She continues to recuperate. She denies chest pain or shortness of breath. She remains on Coumadin anticoagulation because of preop A. fib which converted prior to discharge on amiodarone which has since been discontinued. She is participating cardiac rehab. She was on 1 L of O2 with exercise and at night which she was not on prior to admission I think can probably be discontinued.  She  was seen in the emergency room for PAF which converted spontaneously several hours later.  She had a recurrent episode after that.  She saw Roderic Palau in the A. fib clinic 03/13/2020.  She was started on Eliquis oral anticoagulation and her carvedilol was down titrated because of dizziness.  Recent 2D echo performed 10/11/2019 revealed normal LV systolic function with a well-functioning aortic bioprosthesis.    Current Meds  Medication Sig  . amoxicillin (AMOXIL) 500 MG tablet TAKE 4 TABLETS 1 HOUR BEFORE DENTAL EXAMINATION  . atorvastatin (LIPITOR) 20 MG tablet Take 20 mg by mouth daily.   . carvedilol (COREG) 6.25 MG tablet Take 1.5 tablets (9.375 mg total) by mouth 2 (two) times daily with a meal.  . cholecalciferol (VITAMIN D3) 25 MCG (1000 UT) tablet Take 1,000 Units by mouth daily.  [name] denosumab (PROLIA) 60 MG/ML SOSY injection Inject 60 mg into the skin every 6 (six) months.  [name]  diltiazem (CARDIZEM) 30 MG tablet Take 1 tablet every 4 hours AS NEEDED for AFIB heart rate >100  . ELIQUIS 5 MG TABS tablet TAKE 1 TABLET(5 MG) BY MOUTH TWICE DAILY  . fluticasone (FLONASE) 50 MCG/ACT nasal spray Place 2 sprays into both nostrils daily.  [name] guaiFENesin (MUCINEX) 600 MG 12 hr tablet Take 1 tablet (600 mg total) by  mouth 2 (two) times daily.  . sertraline (ZOLOFT) 100 MG tablet Taking by mouth 100mg  in the am and 50mg  in the pm  . Tiotropium Bromide-Olodaterol (STIOLTO RESPIMAT) 2.5-2.5 MCG/ACT AERS Inhale 2 puffs into the lungs daily.  . vitamin B-12 (CYANOCOBALAMIN) 500 MCG tablet Take 500 mcg by mouth daily.     No Known Allergies  Social History   Socioeconomic History  . Marital status: Married    Spouse name: Not on file  . Number of children: 2  . Years of education: college  . Highest education level: Not on file  Occupational History    Employer: RETIRED  Tobacco Use  . Smoking status: Former Smoker    Packs/day: 3.00    Years: 30.00    Pack years: 90.00    Types: Cigarettes    Start date: 05/19/1958    Quit date: 06/30/1990    Years since quitting: 30.0  . Smokeless tobacco: Never Used  . Tobacco comment: Stopped smoking for 2-3 years during second child.   Vaping Use  . Vaping Use: Never used  Substance and Sexual Activity  . Alcohol use: Yes    Comment: 1-2 glasses wine/month  . Drug use: No  . Sexual activity: Yes    Birth control/protection: Post-menopausal    Comment: Menarche age 28, G83,P2, Menopause in 34"s, No HRT Married  Other Topics Concern  . Not on file  Social History Narrative   [facility] (12/29/16):   Originally from [place], [place]. Moved to [place] at 80 y.o. Does have a dog. Hasn't worked outside the home. Remote parakeet exposure from her children. No mold or hot tub exposure. Enjoys doing yard work & walking.    Social Determinants of Health   Financial Resource Strain: Not on file  Food Insecurity: Not on file  Transportation Needs: Not on file  Physical Activity: Not on file  Stress: Not on file  Social Connections: Not on file  Intimate Partner Violence: Not on file     Review of Systems: General: negative for chills, fever, night sweats or weight changes.  Cardiovascular: negative for chest pain, dyspnea on exertion, edema, orthopnea,  palpitations, paroxysmal nocturnal dyspnea or shortness of breath Dermatological: negative for rash Respiratory: negative for cough or wheezing Urologic: negative for hematuria Abdominal: negative for nausea, vomiting, diarrhea, bright red blood per rectum, melena, or hematemesis Neurologic: negative for visual changes, syncope, or dizziness All other systems reviewed and are otherwise negative except as noted above.    Blood pressure 140/90, pulse 70, height 5\' 4"  (1.626 m), weight 128 lb (58.1 kg).  General appearance: alert and no distress Neck: no adenopathy, no carotid bruit, no JVD, supple, symmetrical, trachea midline and thyroid not enlarged, symmetric, no tenderness/mass/nodules Lungs: clear to auscultation bilaterally Heart: regular rate and rhythm, S1, S2 normal, no murmur, click, rub or gallop Extremities: extremities normal, atraumatic, no cyanosis or edema Pulses: 2+ and symmetric Skin: Skin color, texture, turgor normal. No rashes or lesions Neurologic: Alert and oriented X 3, normal strength and tone. Normal symmetric reflexes. Normal coordination and gait  EKG not performed today  ASSESSMENT  AND PLAN:   Left ventricular dysfunction History of LV dysfunction with an EF in the 40 to 45% in the past. She underwent AVR 01/31/2019 with subsequent echo performed 10/11/2019 revealing normal LV systolic function.  Severe aortic regurgitation Status post bioprosthetic AVR by Dr. [name] 01/31/2019 with a 21 mm Inspira's Edwards bioprosthetic valve for severe AI and LV dysfunction. Her most recent 2D echo performed 10/11/2019 revealed normal LV systolic function and a well-functioning aortic bioprosthesis. This will be repeated this coming April.  Essential hypertension History of essential hypertension blood pressure measured today 140/90. She is on carvedilol.  HLD (hyperlipidemia) History of hyperlipidemia on atorvastatin 20 mg a day with lipid profile performed 01/18/2020  revealing total cholesterol 134, LDL 71 and HDL of 48.  PAF (paroxysmal atrial fibrillation) (H[facility]) History of PAF maintaining sinus rhythm on Eliquis oral anticoagulation.      Lorretta Harp MD FACP,FACC,FAHA, [facility] 07/05/2020 11:28 AM

## 2020-07-05 NOTE — Assessment & Plan Note (Signed)
History of hyperlipidemia on atorvastatin 20 mg a day with lipid profile performed 01/18/2020 revealing total cholesterol 134, LDL 71 and HDL of 48.

## 2020-07-08 ENCOUNTER — Other Ambulatory Visit: Payer: Self-pay | Admitting: Cardiothoracic Surgery

## 2020-07-08 ENCOUNTER — Other Ambulatory Visit (HOSPITAL_COMMUNITY): Payer: Self-pay | Admitting: Nurse Practitioner

## 2020-07-08 DIAGNOSIS — Z792 Long term (current) use of antibiotics: Secondary | ICD-10-CM

## 2020-07-09 ENCOUNTER — Other Ambulatory Visit (HOSPITAL_COMMUNITY): Payer: Self-pay | Admitting: *Deleted

## 2020-07-14 ENCOUNTER — Other Ambulatory Visit: Payer: Self-pay | Admitting: Cardiovascular Disease

## 2020-07-14 DIAGNOSIS — Z792 Long term (current) use of antibiotics: Secondary | ICD-10-CM

## 2020-08-05 DIAGNOSIS — J449 Chronic obstructive pulmonary disease, unspecified: Secondary | ICD-10-CM | POA: Diagnosis not present

## 2020-08-05 DIAGNOSIS — R0789 Other chest pain: Secondary | ICD-10-CM | POA: Diagnosis not present

## 2020-08-06 ENCOUNTER — Telehealth: Payer: Self-pay | Admitting: Cardiovascular Disease

## 2020-08-06 NOTE — Telephone Encounter (Signed)
Patient of Dr. [name] who reports excruciating pain in left shoulder/nausea last night, felt like she would faint. Lasted 20 minutes -- took ibuprofen w/relief, pain subsided. She reports her shoulder is sore today. She does not report that this is reproducible pain. No shoulder injury. She has shortness of breath at baseline. She reports stable vital signs.   Advised to seek ED eval if she had chest pain/pressure lasting more than 30 mins in duration  Scheduled appointment with [facility] PA on 2/18

## 2020-08-06 NOTE — Telephone Encounter (Signed)
Patient called and stated that last night she woke up to excruciating pain in left shoulder and nauseated. Lasted about 20 min. Patient took 2 ibuprofin and laid back down. Please call back

## 2020-08-06 NOTE — Progress Notes (Signed)
Cardiology Office Note:    Date:  08/09/2020   ID:  Dana Pruitt, DOB 05-10-1941, MRN [id number]  PCP:  Deland Pretty, MD  Cardiologist:  Quay Burow, MD  Electrophysiologist:  None   Referring MD: Deland Pretty, MD   Chief Complaint: shoulder pain  History of Present Illness:    Dana Pruitt is a 80 y.o. female with a history of mild non-obstructive CAD on cardiac cath in 12/2018, severe aortic insufficiency s/p AVR in 70/3500, chronic systolic CHF/non-ischemic cardiomyopathy with normalization of EF, paroxysmal atrial fibrillation on Eliquis, COPD, hypertension, hyperlipidemia, GERD, anxiety/depression, and breast cancer s/p chemotherapy/radiation and  lumpectomy in 2013  who is followed by Dr. [name] and presents today for further evaluation of shoulder pain.  Patient was referred to Dr. [name] in 2015 after Echo showed LVEF of 35-40% with moderate aortic insuffiencey. She was started on beta-blocker for her cardiomyopathy and EF eventually normalized to to 60-65% on repeat Echo in 06/2016. Aortic disease progressed and Echo in 09/2018 showed LVEF of 40-45 with diffuse hypokinesis, grade 1 diastolic dysfunction, and severe AI.She was admitted in 12/2018 with chest pain and shortness of breath. She underwent R/LHC at that time which showed mild non-obstructive CAD, wide aortic pulse pressure suggestive of significant AI, preserved cardiac output, and low filling pressures. Patient ultimately underwent AVR with 95m Inspiris Edwards valve by Dr. VPrescott Gumon 01/31/2019. She did develop post-op atrial fibrillation but converted back to sinus rhythm after initiation of Amiodarone. She was discharge on Amiodarone and Coumadin both of which were able to be discontinued later. However, she did have recurrent episodes of atrial fibrillation. Most recent Echo in 09/2019 LVEF of 55-60% with well functioning AVR. [facility] and was started on  Eliquis. Her Coreg was decreased at that time due to dizziness. She was last seen by Dr. BGwenlyn Foundon 07/05/2020 at which time she was doing well from a cardiac standpoint.  Patient called our office on 08/06/2020 stated she woke up last night with excruciating pain in her left shoulder with associated nausea. Therefore, this visit was arranged for further evaluation.   Patient is here today alone. We discussed this episode of shoulder pain. Patient states she woke up around 2am the other night with severe pain at left shoulder pain that she describes as an "ache" with associated nausea and near syncope. She states she had to get up and go to the bathroom and lay on the floor. The pain was so severe that she did vomit a little. Pain eventually resolved on its own after about 15 minutes. She has also noticed some left shoulder aching before and after this event that is worse when she lifts her arm. She notes one similar episode of pain about 1.5-2 months ago. She initially thought it was just musculoskeletal in nature but then she started reading on the Internet and got concerned that she could be having a serious heart issue. No recurrence of pain. No chest pain. She does note some dyspnea when going up stairs but no shortness of breath at rest. No orthopnea, PND, or edema. She also reports multiple episodes of "dizziness" and "spacy" feeling. She feels slightly dizzy in the office today. Vitals normal. She describes one episode of severe dizziness a couple of weeks ago when she was walking in her neighborhood and she had to have someone help her back home. No falls or syncope. She states he BP  is normally in the 120's/80's but it still spikes at times. Coreg was previously decreased due to dizziness. She is not sure whether decreased dose has actually helped or not.   Past Medical History:  Diagnosis Date  . Anemia    "several times over the years" (10/17/2012)  . Anxiety   . Breast cancer ([place]) 02/2010   right  breast  . Carcinoma of breast treated with adjuvant hormone therapy ([place] [place]) 9/11   [place]  . CHF (congestive heart failure) ([place])   . COPD (chronic obstructive pulmonary disease) ([facility])   . Depression   . Exertional shortness of breath    "recently" (10/17/2012)  . GERD (gastroesophageal reflux disease)    diet controlled, no med  . Hollenhorst plaque, left eye    apparently resolved at recent ophthalmology visit  . Hyperlipidemia   . Hypertension   . 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 Left ventricular dysfunction   . Moderate aortic insufficiency   . Pneumonia    "that's why I'm here" (10/17/2012)  . S/P AVR (aortic valve replacement) 04/26/2019    Past Surgical History:  Procedure Laterality Date  . AORTIC VALVE REPLACEMENT N/A 01/31/2019   Procedure: AORTIC VALVE REPLACEMENT (AVR) USING 21 MM INSPIRIS RESILIS AORTIC VALVE. SN: [id number];  Surgeon: Prescott Gum, Collier Salina, MD;  Location: [facility];  Service: Open Heart Surgery;  Laterality: N/A;  . APPENDECTOMY  1974  . BREAST BIOPSY Right 02/27/10   Needle core Biopsy; Invasive Mammary; ER/PR Positive, Her-2 Neu negative, Ki-67 22%  . BREAST LUMPECTOMY WITH SENTINEL LYMPH NODE BIOPSY Right 07/03/11   Invasive Ductal Carcinoma;0/3 nodes negative,; ER,PR Positive, Her-2 Neg; Ki-67 22%  . [place]; 1974  . COLONOSCOPY    . DILATION AND CURETTAGE OF UTERUS  1970's  . RIGHT/LEFT HEART CATH AND CORONARY ANGIOGRAPHY N/A 01/02/2019   Procedure: RIGHT/LEFT HEART CATH AND CORONARY ANGIOGRAPHY;  Surgeon: Larey Dresser, MD;  Location: [facility];  Service: Cardiovascular;  Laterality: N/A;  . TEE WITHOUT CARDIOVERSION N/A 01/31/2019   Procedure: TRANSESOPHAGEAL ECHOCARDIOGRAM (TEE);  Surgeon: Prescott Gum, Collier Salina, MD;  Location: [facility];  Service: Open Heart Surgery;  Laterality: N/A;  . TUBAL LIGATION  1974  . WISDOM TOOTH EXTRACTION      Current Medications: Current Meds  Medication Sig  . amoxicillin (AMOXIL) 500 MG  tablet TAKE 4 TABLETS 1 HOUR BEFORE DENTAL EXAMINATION  . atorvastatin (LIPITOR) 20 MG tablet Take 20 mg by mouth daily.   . carvedilol (COREG) 6.25 MG tablet TAKE 1 AND 1/2 TABLETS(9.375 MG) BY MOUTH TWICE DAILY WITH A MEAL  . cholecalciferol (VITAMIN D3) 25 MCG (1000 UT) tablet Take 1,000 Units by mouth daily.  [name] denosumab (PROLIA) 60 MG/ML SOSY injection Inject 60 mg into the skin every 6 (six) months.  . diltiazem (CARDIZEM) 30 MG tablet Take 1 tablet every 4 hours AS NEEDED for AFIB heart rate >100  . ELIQUIS 5 MG TABS tablet TAKE 1 TABLET(5 MG) BY MOUTH TWICE DAILY  . fluticasone (FLONASE) 50 MCG/ACT nasal spray Place 2 sprays into both nostrils daily.  . Fluticasone-Umeclidin-Vilant (TRELEGY ELLIPTA IN) Inhale into the lungs.  [name] guaiFENesin (MUCINEX) 600 MG 12 hr tablet Take 1 tablet (600 mg total) by mouth 2 (two) times daily.  . sertraline (ZOLOFT) 100 MG tablet Taking by mouth 123m in the am and 563min the pm  . vitamin B-12 (CYANOCOBALAMIN) 500 MCG tablet Take 500 mcg by mouth daily.  . [DISCONTINUED]  Tiotropium Bromide-Olodaterol (STIOLTO RESPIMAT) 2.5-2.5 MCG/ACT AERS Inhale 2 puffs into the lungs daily.     Allergies:   Patient has no known allergies.   Social History   Socioeconomic History  . Marital status: Married    Spouse name: Not on file  . Number of children: 2  . Years of education: college  . Highest education level: Not on file  Occupational History    Employer: RETIRED  Tobacco Use  . Smoking status: Former Smoker    Packs/day: 3.00    Years: 30.00    Pack years: 90.00    Types: Cigarettes    Start date: 05/19/1958    Quit date: 06/30/1990    Years since quitting: 30.1  . Smokeless tobacco: Never Used  . Tobacco comment: Stopped smoking for 2-3 years during second child.   Vaping Use  . Vaping Use: Never used  Substance and Sexual Activity  . Alcohol use: Yes    Comment: 1-2 glasses wine/month  . Drug use: No  . Sexual activity: Yes    Birth  control/protection: Post-menopausal    Comment: Menarche age 76, G48,P2, Menopause in 60"s, No HRT Married  Other Topics Concern  . Not on file  Social History Narrative   [facility] (12/29/16):   Originally from [place], [place]. Moved to [place] at 80 y.o. Does have a dog. Hasn't worked outside the home. Remote parakeet exposure from her children. No mold or hot tub exposure. Enjoys doing yard work & walking.    Social Determinants of Health   Financial Resource Strain: Not on file  Food Insecurity: Not on file  Transportation Needs: Not on file  Physical Activity: Not on file  Stress: Not on file  Social Connections: Not on file     Family History: The patient's family history includes Cancer in her father and paternal grandmother; Heart disease in her maternal grandfather, maternal grandmother, and mother. There is no history of Lung disease.  ROS:   Please see the history of present illness.     EKGs/Labs/Other Studies Reviewed:    The following studies were reviewed today:  Right/Left Cardiac Catheterization 01/02/2019: 1. Low filling pressures and normal PA pressure.  2. Preserved cardiac output.  3. Wide aortic pulse pressure is suggestive of significant aortic insufficiency.  Aortic root shot was not done due to CKD (GFR around 30).  4. Mild nonobstructive CAD.  30 cc contrast used.  _______________  Echocardiogram 10/11/2019: Impressions: 1. Left ventricular ejection fraction, by estimation, is 55 to 60%. The  left ventricle has normal function. The left ventricle has no regional  wall motion abnormalities. There is mild concentric left ventricular  hypertrophy. Left ventricular diastolic  parameters were normal.  2. Right ventricular systolic function is mildly reduced. The right  ventricular size is normal. There is normal pulmonary artery systolic  pressure.  3. The mitral valve is normal in structure. Trivial mitral valve  regurgitation. No evidence of mitral  stenosis.  4. The aortic valve has been repaired/replaced. Aortic valve  regurgitation is not visualized. There is a 21 mm Inspiris Resilis Aortic  Valve valve present in the aortic position. Procedure Date: 01/31/2019.  Echo findings are consistent with normal  structure and function of the aortic valve prosthesis.  5. The inferior vena cava is normal in size with greater than 50%  respiratory variability, suggesting right atrial pressure of 3 mmHg.   Comparison(s): No significant change from prior study.    EKG:  EKG ordered today.  EKG personally reviewed and demonstrates normal sinus rhythm, rate 67 bpm, with non-specific ST/T changes. Normal PR and QRS intervals. Normal axis. [name]  Recent Labs: 02/18/2020: B Natriuretic Peptide 650.1; Magnesium 1.9; TSH 1.666 03/13/2020: BUN 17; Creatinine, Ser 1.25; Hemoglobin 13.1; Platelets 217; Potassium 4.4; Sodium 141  Recent Lipid Panel    Component Value Date/Time   CHOL 140 12/17/2018 0430   CHOL 197 02/16/2014 0826   TRIG 103 12/17/2018 0430   HDL 38 (L) 12/17/2018 0430   HDL 52 02/16/2014 0826   CHOLHDL 3.7 12/17/2018 0430   VLDL 21 12/17/2018 0430   LDLCALC 81 12/17/2018 0430   LDLCALC 127 (H) 02/16/2014 0826    Physical Exam:    Vital Signs: BP 130/82   Pulse 67   Ht 5' 4.5" (1.638 m)   Wt 128 lb 8 oz (58.3 kg)   LMP  (LMP Unknown)   SpO2 96%   BMI 21.72 kg/m     Wt Readings from Last 3 Encounters:  08/09/20 128 lb 8 oz (58.3 kg)  07/05/20 128 lb (58.1 kg)  03/29/20 121 lb 6.4 oz (55.1 kg)     General: 80 y.o. female in no acute distress. HEENT: Normocephalic and atraumatic.  Neck: Supple. No carotid bruits. No JVD. Heart: RRR. Distinct S1 and S2. No murmurs, gallops, or rubs. Radial and distal pedal pulses 2+ and equal bilaterally. Lungs: No increased work of breathing. Clear to ausculation bilaterally. No wheezes, rhonchi, or rales.  Abdomen: Soft, non-distended, and non-tender to palpation.  Extremities: No lower  extremity edema.    Skin: Warm and dry. Neuro: Alert and oriented x3. No focal deficits. Psych: Normal affect. Responds appropriately.   Assessment:    1. Left shoulder blade pain   2. Coronary artery disease involving native coronary artery of native heart without angina pectoris   3. Non-ischemic cardiomyopathy ([place])   4. Chronic systolic CHF (congestive heart failure) ([facility])   5. Dizziness   6. Paroxysmal atrial fibrillation ([facility])   7. Severe aortic regurgitation   8. S/P AVR   9. Primary hypertension   10. Hyperlipidemia, unspecified hyperlipidemia type     Plan:    Left Shoulder Blade Pain - Patient has had 2 recent episodes of severe left shoulder blade pain at night. No chest pain. She also notes some dyspnea when walking up stairs. - EKG showed non-specific ST/T changes. - Sounds atypical. Maybe musculoskeletal in nature. However, given severity and more than 1 episode of this, will order Lexiscan Myoview to rule out anginal equivalent. If negative, suspect musculoskeletal.  Shared Decision Making/Informed Consent{ The risks [chest pain, shortness of breath, cardiac arrhythmias, dizziness, blood pressure fluctuations, myocardial infarction, stroke/transient ischemic attack, nausea, vomiting, allergic reaction, radiation exposure, metallic taste sensation and life-threatening complications (estimated to be 1 in 10,000)], benefits (risk stratification, diagnosing coronary artery disease, treatment guidance) and alternatives of a nuclear stress test were discussed in detail with Ms. Marse and she agrees to proceed.  Non-Obstructive CAD - Noted on cardiac cath in 12/2018. - No aspirin due to need for DOAC. - Continue beta-blocker and statin. - Stress test as above given new shoulder blade pain.  Non-Ischemic Cardiomyopathy Chronic Systolic CHF - LVEF as low as 35-40% in 2015 but improved to 60-65% on last Echo in 09/2019. - She does note some dyspnea when walking up stairs  but no shortness of breath at rest. Appears euvolemic on exam.  - Continue beta-blocker.  Dizziness - Patient notes several episodes of dizziness  for a while now. She has had a couple of severe episodes with associated near syncope. No overt syncope. - Will get 30-day Event Monitor. - She already has upcoming Echo planned for routine monitoring of AVR. - Asked patient to check BP/HR the next time she has one of these episodes. - Patient to let us know if this worsens prior to next visit. - Will check CBC to make sure hemoglobin is stable.  Paroxysmal Atrial Fibrillation - Maintaining sinus rhythm. - Continue Coreg 9.3439m twice daily. - Continue Cardizem 336mPRN for rapid atrial fibrillation. - Continue Eliquis 39m44mwice daily.  Severe Aortic Insufficiency s/p AVR in 04/2019 - Most recent Echo in 09/2019 showed LVEF of 60-65% with normal structure and function of aortic valve prosthesis. - Plan is for repeat Echo in 09/2020.  Hypertension - BP well controlled today. She states it is usually well controlled at home as well but it still spikes from time to time.  - Continue Coreg for now.  - Have asked patient to keep BP/HR log and bring to follow-up appointment.   Hyperlipidemia - Continue Lipitor 12m76mily.   Disposition: Follow up in 6-8 weeks with Dr. BerrGwenlyn FoundMedication Adjustments/Labs and Tests Ordered: Current medicines are reviewed at length with the patient today.  Concerns regarding medicines are outlined above.  Orders Placed This Encounter  Procedures  . CBC  . Cardiac Stress Test: Informed Consent Details: Physician/Practitioner Attestation; Transcribe to consent form and obtain patient signature  . Cardiac event monitor  . MYOCARDIAL PERFUSION IMAGING  . EKG 12-Lead   No orders of the defined types were placed in this encounter.   Patient Instructions  Medication Instructions:  No Changes *If you need a refill on your cardiac medications before your next  appointment, please call your pharmacy*   Lab Work: CBC If you have labs (blood work) drawn today and your tests are completely normal, you will receive your results only by: . MyMarland Kitchenhart Message (if you have MyChart) OR . A paper copy in the mail If you have any lab test that is abnormal or we need to change your treatment, we will call you to review the results.   Testing/Procedures: [id number] Your physician has requested that you have a lexiscan myoview. For further information please visit [email or web address] follow instruction sheet, as given.  [employer] Cardiac Event Monitor Instructions Your physician has requested you wear your cardiac event monitor for ___30__ days, (1-30). [employer] may call or text to confirm a shipping address. The monitor will be sent to a land address via UPS. [employer] will not ship a monitor to a PO BOX. It typically takes 3-5 days to receive your monitor after it has been enrolled. [employer] will assist with USPS tracking if your package is delayed. The telephone number for [employer] is [phone] you have received your monitor, please review the enclosed instructions. Instruction tutorials can also be viewed under help and settings on the enclosed cell phone. Your monitor has already been registered assigning a specific monitor serial # to you.  Applying the monitor Remove cell phone from case and turn it on. The cell phone works as yourDealer needs to be within 10 fMerrill Lynchyou at all times. The cell phone will need to be charged on a daily basis. We recommend you plug the cell phone into the enclosed charger at your bedside table every night.  Monitor batteries: You will receive two monitor batteries labelled #  1 and #2. These are your recorders. Plug battery #2 onto the second connection on the enclosed charger. Keep one battery on the charger at all times. This will keep the monitor battery  deactivated. It will also keep it fully charged for when you need to switch your monitor batteries. A small light will be blinking on the battery emblem when it is charging. The light on the battery emblem will remain on when the battery is fully charged.  Open package of a Monitor strip. Insert battery #1 into black hood on strip and gently squeeze monitor battery onto connection as indicated in instruction booklet. Set aside while preparing skin.  Choose location for your strip, vertical or horizontal, as indicated in the instruction booklet. Shave to remove all hair from location. There cannot be any lotions, oils, powders, or colognes on skin where monitor is to be applied. Wipe skin clean with enclosed Saline wipe. Dry skin completely.  Peel paper labeled #1 off the back of the Monitor strip exposing the adhesive. Place the monitor on the chest in the vertical or horizontal position shown in the instruction booklet. One arrow on the monitor strip must be pointing upward. Carefully remove paper labeled #2, attaching remainder of strip to your skin. Try not to create any folds or wrinkles in the strip as you apply it.  Firmly press and release the circle in the center of the monitor battery. You will hear a small beep. This is turning the monitor battery on. The heart emblem on the monitor battery will light up every 5 seconds if the monitor battery in turned on and connected to the patient securely. Do not push and hold the circle down as this turns the monitor battery off. The cell phone will locate the monitor battery. A screen will appear on the cell phone checking the connection of your monitor strip. This may read poor connection initially but change to good connection within the next minute. Once your monitor accepts the connection you will hear a series of 3 beeps followed by a climbing crescendo of beeps. A screen will appear on the cell phone showing the two monitor strip  placement options. Touch the picture that demonstrates where you applied the monitor strip.  Your monitor strip and battery are waterproof. You are able to shower, bathe, or swim with the monitor on. They just ask you do not submerge deeper than 3 feet underwater. We recommend removing the monitor if you are swimming in a lake, river, or ocean.  Your monitor battery will need to be switched to a fully charged monitor battery approximately once a week. The cell phone will alert you of an action which needs to be made.  On the cell phone, tap for details to reveal connection status, monitor battery status, and cell phone battery status. The green dots indicates your monitor is in good status. A red dot indicates there is something that needs your attention.  To record a symptom, click the circle on the monitor battery. In 30-60 seconds a list of symptoms will appear on the cell phone. Select your symptom and tap save. Your monitor will record a sustained or significant arrhythmia regardless of you clicking the button. Some patients do not feel the heart rhythm irregularities. [employer] will notify us of any serious or critical events.  Refer to instruction booklet for instructions on switching batteries, changing strips, the Do not disturb or Pause features, or any additional questions.  Call [employer] at [phone], to confirm  your monitor is transmitting and record your baseline. They will answer any questions you may have regarding the monitor instructions at that time.  Returning the monitor to [place] all equipment back into blue box. Peel off strip of paper to expose adhesive and close box securely. There is a prepaid UPS shipping label on this box. Drop in a UPS drop box, or at a UPS facility like [employer]. You may also contact [employer] to arrange UPS to pick up monitor package at your home.  Follow-Up: At [facility], you and your health needs are our priority.   As part of our continuing mission to provide you with exceptional heart care, we have created designated Provider Care Teams.  These Care Teams include your primary Cardiologist (physician) and Advanced Practice Providers (APPs -  Physician Assistants and Nurse Practitioners) who all work together to provide you with the care you need, when you need it.  We recommend signing up for the patient portal called "MyChart".  Sign up information is provided on this After Visit Summary.  MyChart is used to connect with patients for Virtual Visits (Telemedicine).  Patients are able to view lab/test results, encounter notes, upcoming appointments, etc.  Non-urgent messages can be sent to your provider as well.   To learn more about what you can do with MyChart, go to [email or web address].    Your next appointment:   6-8 weeks   The format for your next appointment:   In Person  Provider:   Quay Burow, MD   Other Instructions Monitor BP and Heart Rate Daily    Signed, Darreld Mclean, PA-C  08/09/2020 1:22 PM    [facility]

## 2020-08-07 NOTE — Progress Notes (Incomplete)
Cardiology Office Note:    Date:  08/06/2020   ID:  Dana Pruitt, DOB 1941/06/08, MRN [id number]  PCP:  Deland Pretty, MD  Cardiologist:  Quay Burow, MD  Electrophysiologist:  None   Referring MD: Deland Pretty, MD   Chief Complaint: ***  History of Present Illness:    Dana Pruitt is a 80 y.o. female with a history of mild non-obstructive CAD on cardiac cath in 12/2018, severe aortic insufficiency s/p AVR in 41/9379, chronic systolic CHF, paroxysmal atrial fibrillation on *** COPD, hypertension, hyperlipidemia, GERD, anxiety/depression, and breast cancer s/p chemotherapy/radiation and  lumpectomy in 2013  who is followed by Dr. [name] and presents today for ***  Patient was referred to Dr. [name] in 2015 after Echo showed LVEF of 35-40% with moderate aortic insuffiencey. She was started on beta-blocker for her cardiomyopathy and EF eventually normalized to to 60-65% on repeat Echo in 06/2016. Aortic disease progressed and Echo in 09/2018 showed LVEF of 40-45 with diffuse hypokinesis, grade 1 diastolic dysfunction, and severe AI.She was admitted in 12/2018 with chest pain and shortness of breath. She underwent R/LHC at that time which showed mild non-obstructive CAD, wide aortic pulse pressure suggestive of significant AI, preserved cardiac output, and low filling pressures. Patient ultimately underwent AVR with 62m Inspiris Edwards valve by Dr. VPrescott Gumon 01/31/2019. She did develop post-op atrial fibrillation but converted back to sinus rhythm after initiation of Amiodarone. She was discharge on Amiodarone and Coumadin both of which were able to be discontinued later. However, she did have recurrent episodes of atrial fibrillation. [facility] and was started on Eliquis. Her Coreg was decreased at that time due to dizziness.   Past Medical History:  Diagnosis Date  . Anemia    "several times over the years" (10/17/2012)  .  Anxiety   . [place] ([place] 02[place]   right breast  . Carcinoma of breast treated with adjuvant hormone therapy ([facility] 9/11   [place]  . CHF ([place]estive heart failure) ([facility] [facility]   . COPD (chronic obstructive pulmonary disease) ([facility]   . Depression   . Exertional shortness of breath    "recently" (10/17/2012)  . GERD (gastroesophageal reflux disease)    diet controlled, no med  . Hollenhorst plaque, left eye    apparently resolved at recent ophthalmology visit  . Hyperlipidemia   . Hypertension   . 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 Left ventricular dysfunction   . Moderate aortic insufficiency   . Pneumonia    "that's why I'm here" (10/17/2012)  . S/P AVR (aortic valve replacement) 04/26/2019    Past Surgical History:  Procedure Laterality Date  . AORTIC VALVE REPLACEMENT N/A 01/31/2019   Procedure: AORTIC VALVE REPLACEMENT (AVR) USING 21 MM INSPIRIS RESILIS AORTIC VALVE. SN:: [id number]  Surgeon: VPrescott Gum PCollier Salina MD;  [facility]  Service: Open Heart Surgery;  Laterality: N/A;  . APPENDECTOMY  1974  . BREAST BIOPSY Right 02/27/10   Needle core Biopsy; Invasive Mammary; ER/PR Positive, Her-2 Neu negative, Ki-67 22%  . BREAST LUMPECTOMY WITH SENTINEL LYMPH NODE BIOPSY Right 07/03/11   Invasive Ductal Carcinoma;0/3 nodes negative,; ER,PR Positive, Her-2 Neg; Ki-67 22%  . [place]  . COLONOSCOPY    . DILATION AND CURETTAGE OF UTERUS  1970's  . RIGHT/LEFT HEART CATH AND CORONARY ANGIOGRAPHY N/A 01/02/2019   Procedure: RIGHT/LEFT HEART CATH AND CORONARY ANGIOGRAPHY;  Surgeon: MLoralie Champagne  S, MD;  Location: [facility];  Service: Cardiovascular;  Laterality: N/A;  . TEE WITHOUT CARDIOVERSION N/A 01/31/2019   Procedure: TRANSESOPHAGEAL ECHOCARDIOGRAM (TEE);  Surgeon: Prescott Gum, Collier Salina, MD;  Location: [place];  Service: Open Heart Surgery;  Laterality: N/A;  . TUBAL LIGATION  1974  . WISDOM TOOTH EXTRACTION      Current Medications: No outpatient  medications have been marked as taking for the 08/09/20 encounter (Appointment) with Darreld Mclean, PA-C.     Allergies:   Patient has no known allergies.   Social History   Socioeconomic History  . Marital status: Married    Spouse name: Not on file  . Number of children: 2  . Years of education: college  . Highest education level: Not on file  Occupational History    Employer: RETIRED  Tobacco Use  . Smoking status: Former Smoker    Packs/day: 3.00    Years: 30.00    Pack years: 90.00    Types: Cigarettes    Start date: 05/19/1958    Quit date: 06/30/1990    Years since quitting: 30.1  . Smokeless tobacco: Never Used  . Tobacco comment: Stopped smoking for 2-3 years during second child.   Vaping Use  . Vaping Use: Never used  Substance and Sexual Activity  . Alcohol use: Yes    Comment: 1-2 glasses wine/month  . Drug use: No  . Sexual activity: Yes    Birth control/protection: Post-menopausal    Comment: Menarche age 42, G61,P2, Menopause in 65"s, No HRT Married  Other Topics Concern  . Not on file  Social History Narrative   [facility] (12/29/16):   Originally from [place], [place]. Moved to [place] at 80 y.o. Does have a dog. Hasn't worked outside the home. Remote parakeet exposure from her children. No mold or hot tub exposure. Enjoys doing yard work & walking.    Social Determinants of Health   Financial Resource Strain: Not on file  Food Insecurity: Not on file  Transportation Needs: Not on file  Physical Activity: Not on file  Stress: Not on file  Social Connections: Not on file     Family History: The patient's ***family history includes Cancer in her father and paternal grandmother; Heart disease in her maternal grandfather, maternal grandmother, and mother. There is no history of Lung disease.  ROS:   Please see the history of present illness.    *** All other systems reviewed and are negative.  EKGs/Labs/Other Studies Reviewed:    The following  studies were reviewed today: ***  EKG:  EKG *** ordered today. EKG personally reviewed and demonstrates ***.  Recent Labs: 02/18/2020: B Natriuretic Peptide 650.1; Magnesium 1.9; TSH 1.666 03/13/2020: BUN 17; Creatinine, Ser 1.25; Hemoglobin 13.1; Platelets 217; Potassium 4.4; Sodium 141  Recent Lipid Panel    Component Value Date/Time   CHOL 140 12/17/2018 0430   CHOL 197 02/16/2014 0826   TRIG 103 12/17/2018 0430   HDL 38 (L) 12/17/2018 0430   HDL 52 02/16/2014 0826   CHOLHDL 3.7 12/17/2018 0430   VLDL 21 12/17/2018 0430   LDLCALC 81 12/17/2018 0430   LDLCALC 127 (H) 02/16/2014 0826    Physical Exam:    Vital Signs: LMP  (LMP Unknown)     Wt Readings from Last 3 Encounters:  07/05/20 128 lb (58.1 kg)  03/29/20 121 lb 6.4 oz (55.1 kg)  03/13/20 122 lb 12.8 oz (55.7 kg)     General: 80 y.o. female in  no acute distress. HEENT: Normocephalic and atraumatic. Sclera clear. EOMs intact. Neck: Supple. No carotid bruits. No JVD. Heart: *** RRR. Distinct S1 and S2. No murmurs, gallops, or rubs. Radial and distal pedal pulses 2+ and equal bilaterally. Lungs: No increased work of breathing. Clear to ausculation bilaterally. No wheezes, rhonchi, or rales.  Abdomen: Soft, non-distended, and non-tender to palpation. Bowel sounds present in all 4 quadrants.  MSK: Normal strength and tone for age. *** Extremities: No lower extremity edema.    Skin: Warm and dry. Neuro: Alert and oriented x3. No focal deficits. Psych: Normal affect. Responds appropriately.   Assessment:    No diagnosis found.  Plan:     Disposition: Follow up in ***   Medication Adjustments/Labs and Tests Ordered: Current medicines are reviewed at length with the patient today.  Concerns regarding medicines are outlined above.  No orders of the defined types were placed in this encounter.  No orders of the defined types were placed in this encounter.   There are no Patient Instructions on file for this  visit.   Signed, Darreld Mclean, PA-C  08/06/2020 7:43 PM    [facility]

## 2020-08-09 ENCOUNTER — Other Ambulatory Visit: Payer: Self-pay

## 2020-08-09 ENCOUNTER — Encounter: Payer: Self-pay | Admitting: Radiology

## 2020-08-09 ENCOUNTER — Encounter: Payer: Self-pay | Admitting: Cardiovascular Disease

## 2020-08-09 ENCOUNTER — Encounter: Payer: Self-pay | Admitting: Student

## 2020-08-09 ENCOUNTER — Ambulatory Visit (INDEPENDENT_AMBULATORY_CARE_PROVIDER_SITE_OTHER): Payer: Medicare Other | Admitting: Student

## 2020-08-09 VITALS — BP 130/82 | HR 67 | Ht 64.5 in | Wt 128.5 lb

## 2020-08-09 DIAGNOSIS — I480 Paroxysmal atrial fibrillation: Secondary | ICD-10-CM | POA: Diagnosis not present

## 2020-08-09 DIAGNOSIS — I5022 Chronic systolic (congestive) heart failure: Secondary | ICD-10-CM | POA: Diagnosis not present

## 2020-08-09 DIAGNOSIS — I2510 Atherosclerotic heart disease of native coronary artery without angina pectoris: Secondary | ICD-10-CM

## 2020-08-09 DIAGNOSIS — I428 Other cardiomyopathies: Secondary | ICD-10-CM | POA: Diagnosis not present

## 2020-08-09 DIAGNOSIS — Z952 Presence of prosthetic heart valve: Secondary | ICD-10-CM

## 2020-08-09 DIAGNOSIS — M25512 Pain in left shoulder: Secondary | ICD-10-CM | POA: Diagnosis not present

## 2020-08-09 DIAGNOSIS — E785 Hyperlipidemia, unspecified: Secondary | ICD-10-CM

## 2020-08-09 DIAGNOSIS — R42 Dizziness and giddiness: Secondary | ICD-10-CM

## 2020-08-09 DIAGNOSIS — I10 Essential (primary) hypertension: Secondary | ICD-10-CM

## 2020-08-09 DIAGNOSIS — I351 Nonrheumatic aortic (valve) insufficiency: Secondary | ICD-10-CM

## 2020-08-09 LAB — CBC
Hematocrit: 38.6 % (ref 34.0–46.6)
Hemoglobin: 13.1 g/dL (ref 11.1–15.9)
MCH: 32 pg (ref 26.6–33.0)
MCHC: 33.9 g/dL (ref 31.5–35.7)
MCV: 94 fL (ref 79–97)
Platelets: 248 10*3/uL (ref 150–450)
RBC: 4.09 x10E6/uL (ref 3.77–5.28)
RDW: 13.7 % (ref 11.7–15.4)
WBC: 5.5 10*3/uL (ref 3.4–10.8)

## 2020-08-09 NOTE — Progress Notes (Signed)
Enrolled patient for a 30 day [employer] Event Monitor to be mailed to patients home  

## 2020-08-09 NOTE — Patient Instructions (Signed)
Medication Instructions:  No Changes *If you need a refill on your cardiac medications before your next appointment, please call your pharmacy*   Lab Work: CBC If you have labs (blood work) drawn today and your tests are completely normal, you will receive your results only by: [name] MyChart Message (if you have MyChart) OR . A paper copy in the mail If you have any lab test that is abnormal or we need to change your treatment, we will call you to review the results.   Testing/Procedures: [place], [address] Your physician has requested that you have a lexiscan myoview. For further information please visit [email or web address]. Please follow instruction sheet, as given.  [employer] Cardiac Event Monitor Instructions Your physician has requested you wear your cardiac event monitor for ___30__ days, (1-30). [employer] may call or text to confirm a shipping address. The monitor will be sent to a land address via UPS. [employer] will not ship a monitor to a PO BOX. It typically takes 3-5 days to receive your monitor after it has been enrolled. [employer] will assist with USPS tracking if your package is delayed. The telephone number for [employer] is [phone]. Once you have received your monitor, please review the enclosed instructions. Instruction tutorials can also be viewed under help and settings on the enclosed cell phone. Your monitor has already been registered assigning a specific monitor serial # to you.  Applying the monitor Remove cell phone from case and turn it on. The cell phone works as Dealer and needs to be within [employer] of you at all times. The cell phone will need to be charged on a daily basis. We recommend you plug the cell phone into the enclosed charger at your bedside table every night.  Monitor batteries: You will receive two monitor batteries labelled #1 and #2. These are your recorders. Plug battery #2 onto the second connection on the  enclosed charger. Keep one battery on the charger at all times. This will keep the monitor battery deactivated. It will also keep it fully charged for when you need to switch your monitor batteries. A small light will be blinking on the battery emblem when it is charging. The light on the battery emblem will remain on when the battery is fully charged.  Open package of a Monitor strip. Insert battery #1 into black hood on strip and gently squeeze monitor battery onto connection as indicated in instruction booklet. Set aside while preparing skin.  Choose location for your strip, vertical or horizontal, as indicated in the instruction booklet. Shave to remove all hair from location. There cannot be any lotions, oils, powders, or colognes on skin where monitor is to be applied. Wipe skin clean with enclosed Saline wipe. Dry skin completely.  Peel paper labeled #1 off the back of the Monitor strip exposing the adhesive. Place the monitor on the chest in the vertical or horizontal position shown in the instruction booklet. One arrow on the monitor strip must be pointing upward. Carefully remove paper labeled #2, attaching remainder of strip to your skin. Try not to create any folds or wrinkles in the strip as you apply it.  Firmly press and release the circle in the center of the monitor battery. You will hear a small beep. This is turning the monitor battery on. The heart emblem on the monitor battery will light up every 5 seconds if the monitor battery in turned on and connected to the patient securely. Do not push and hold the  circle down as this turns the monitor battery off. The cell phone will locate the monitor battery. A screen will appear on the cell phone checking the connection of your monitor strip. This may read poor connection initially but change to good connection within the next minute. Once your monitor accepts the connection you will hear a series of 3 beeps followed by a  climbing crescendo of beeps. A screen will appear on the cell phone showing the two monitor strip placement options. Touch the picture that demonstrates where you applied the monitor strip.  Your monitor strip and battery are waterproof. You are able to shower, bathe, or swim with the monitor on. They just ask you do not submerge deeper than 3 feet underwater. We recommend removing the monitor if you are swimming in a lake, river, or ocean.  Your monitor battery will need to be switched to a fully charged monitor battery approximately once a week. The cell phone will alert you of an action which needs to be made.  On the cell phone, tap for details to reveal connection status, monitor battery status, and cell phone battery status. The green dots indicates your monitor is in good status. A red dot indicates there is something that needs your attention.  To record a symptom, click the circle on the monitor battery. In 30-60 seconds a list of symptoms will appear on the cell phone. Select your symptom and tap save. Your monitor will record a sustained or significant arrhythmia regardless of you clicking the button. Some patients do not feel the heart rhythm irregularities. [employer] will notify us of any serious or critical events.  Refer to instruction booklet for instructions on switching batteries, changing strips, the Do not disturb or Pause features, or any additional questions.  Call [employer] at [phone], to confirm your monitor is transmitting and record your baseline. They will answer any questions you may have regarding the monitor instructions at that time.  Returning the monitor to [place] all equipment back into blue box. Peel off strip of paper to expose adhesive and close box securely. There is a prepaid UPS shipping label on this box. Drop in a UPS drop box, or at a UPS facility like [employer]. You may also contact [employer] to arrange UPS to pick up monitor  package at your home.  Follow-Up: At [facility], you and your health needs are our priority.  As part of our continuing mission to provide you with exceptional heart care, we have created designated Provider Care Teams.  These Care Teams include your primary Cardiologist (physician) and Advanced Practice Providers (APPs -  Physician Assistants and Nurse Practitioners) who all work together to provide you with the care you need, when you need it.  We recommend signing up for the patient portal called "MyChart".  Sign up information is provided on this After Visit Summary.  MyChart is used to connect with patients for Virtual Visits (Telemedicine).  Patients are able to view lab/test results, encounter notes, upcoming appointments, etc.  Non-urgent messages can be sent to your provider as well.   To learn more about what you can do with MyChart, go to [email or web address].    Your next appointment:   6-8 weeks   The format for your next appointment:   In Person  Provider:   Quay Burow, MD   Other Instructions Monitor BP and Heart Rate Daily

## 2020-08-15 ENCOUNTER — Telehealth: Payer: Self-pay | Admitting: Student

## 2020-08-15 NOTE — Telephone Encounter (Signed)
That is fine.   Thank you

## 2020-08-15 NOTE — Telephone Encounter (Signed)
Spoke with patient regarding monitor  Explained what the heart monitor might be looking for  Per patient since starting her new inhaler the lightheadedness she was having has improved She would like to wait until after her stress test before deciding for sure on monitor Will forward to [facility] so she will be aware

## 2020-08-15 NOTE — Telephone Encounter (Signed)
° ° °  Pt said from her last visit with [facility] she agreed to wear a heart monitor. She said she received it today and she kind of having 2nd thought of wearing it. She said if [facility] can give her a call, and she might decide if she going to wear it after her myoview on 03/01

## 2020-08-16 ENCOUNTER — Telehealth (HOSPITAL_COMMUNITY): Payer: Self-pay | Admitting: *Deleted

## 2020-08-16 NOTE — Telephone Encounter (Signed)
Close encounter 

## 2020-08-20 ENCOUNTER — Ambulatory Visit (HOSPITAL_COMMUNITY)
Admission: RE | Admit: 2020-08-20 | Discharge: 2020-08-20 | Disposition: A | Discharge status: Home / Self Care | Payer: Medicare Other | Source: Ambulatory Visit | Attending: Cardiology | Admitting: Cardiology

## 2020-08-20 ENCOUNTER — Other Ambulatory Visit: Payer: Self-pay

## 2020-08-20 DIAGNOSIS — I351 Nonrheumatic aortic (valve) insufficiency: Secondary | ICD-10-CM

## 2020-08-20 DIAGNOSIS — I10 Essential (primary) hypertension: Secondary | ICD-10-CM

## 2020-08-20 DIAGNOSIS — I5022 Chronic systolic (congestive) heart failure: Secondary | ICD-10-CM | POA: Diagnosis not present

## 2020-08-20 DIAGNOSIS — Z952 Presence of prosthetic heart valve: Secondary | ICD-10-CM

## 2020-08-20 DIAGNOSIS — I480 Paroxysmal atrial fibrillation: Secondary | ICD-10-CM

## 2020-08-20 DIAGNOSIS — I2510 Atherosclerotic heart disease of native coronary artery without angina pectoris: Secondary | ICD-10-CM

## 2020-08-20 DIAGNOSIS — I428 Other cardiomyopathies: Secondary | ICD-10-CM

## 2020-08-20 DIAGNOSIS — E785 Hyperlipidemia, unspecified: Secondary | ICD-10-CM | POA: Diagnosis not present

## 2020-08-20 LAB — MYOCARDIAL PERFUSION IMAGING
LV dias vol: 69 mL (ref 46–106)
LV sys vol: 22 mL
Peak HR: 93 {beats}/min
Rest HR: 65 {beats}/min
SDS: 0
SRS: 3
SSS: 3
TID: 0.85

## 2020-08-20 MED ORDER — TECHNETIUM TC 99M TETROFOSMIN IV KIT
30.8000 | PACK | Freq: Once | INTRAVENOUS | Status: AC | PRN
  Administered 2020-08-20: 30.8 via INTRAVENOUS
  Filled 2020-08-20: qty 31

## 2020-08-20 MED ORDER — AMINOPHYLLINE 25 MG/ML IV SOLN
75.0000 mg | Freq: Once | INTRAVENOUS | Status: AC
  Administered 2020-08-20: 75 mg via INTRAVENOUS

## 2020-08-20 MED ORDER — TECHNETIUM TC 99M TETROFOSMIN IV KIT
10.6000 | PACK | Freq: Once | INTRAVENOUS | Status: AC | PRN
  Administered 2020-08-20: 10.6 via INTRAVENOUS
  Filled 2020-08-20: qty 11

## 2020-08-20 MED ORDER — REGADENOSON 0.4 MG/5ML IV SOLN
0.4000 mg | Freq: Once | INTRAVENOUS | Status: AC
  Administered 2020-08-20: 0.4 mg via INTRAVENOUS

## 2020-08-21 ENCOUNTER — Ambulatory Visit: Payer: Medicare Other | Admitting: Internal Medicine

## 2020-08-21 NOTE — Progress Notes (Deleted)
Dana Pruitt, female    DOB: [birth year]     MRN: [id number]   Brief patient profile:   80  yowf MM/quit smoking 1992 previously followed by Dr Ashok Cordia with  GOLD II copd    PFT 01/04/17: FVC 2.17 L (79%) FEV1 1.32 L (64%) FEV1/FVC 0.61 FEF 25-75 0.57 L (34%) negative bronchodilator response TLC 5.23 L (105%) RV 136% ERV 39% DLCO corrected 43% 12/30/12: FVC 2.77 L (101%) FEV1 1.92 L (89%) FEV1/FVC 0.69 FEF 25-75 1.13 L (51%) negative bronchodilator response TLC 4.77 L (97%) RV 110% DLCO uncorrected 51%  6MWT 03/08/17:  Walked 414 meters / Baseline Sat 96% on RA / Nadir Sat 94% on RA (stopped with 4:37 due to poor pulse ox signal)    CARDIAC TTE (07/14/16):  LV normal in size with EF 60-65% & no regional wall motion abnormalities. Grade 1 diastolic dysfunction. LA & RA normal in size. RV mildly dilated with reduced systolic function. Moderate aortic regurgitation without stenosis. Ascending aorta is mildly dilated with aortic root normal in size. No mitral stenosis or regurgitation. Mild pulmonic regurgitation. No significant tricuspid regurgitation. No pericardial effusion.    Admit date: 01/31/2019 Discharge date: 02/10/2019  Admission Diagnoses: Severe aortic insufficiency  Discharge Diagnoses:  Active Problems:   Aortic valve disease    hx The patient is a 80 year old female referred to Dr. [name] right for evaluation of severe aortic insufficiency.  She has been seen by her cardiologist for several years with serial echocardiogram.  Her most recent echo revealed severe AI with some LV dilatation and reduced LV systolic function.  She underwent right and left heart catheterization and was found to have no significant coronary artery disease.  Right heart catheterization showed normal pressures with mixed venous saturations of 68%.  LVEDP was normal and cardiac index was 2.9.  Pulmonary function studies were performed due to a history of smoking heavy smoking in the past,  although really remote.  Her FEV1 was 1.2 and DLCO was 40%.  A CT scan showed the ascending aorta measuring 4.0 cm.  There was findings of panlobular emphysema noted on the CT scan.  Dr. [name] evaluated the patient and all her studies and recommended proceeding with elective aortic valve replacement and she was admitted this hospitalization for the procedure.   Hospital Course: The patient was admitted electively and on 01/31/2019 taken to the operating room where she underwent Aortic valve replacement with a 21 mm Inspiris Edwards valve..  She tolerated it well and was taken to the surgical intensive care unit in stable condition.  Postoperative hospital course:  The patient has done well postoperatively.  She has remained neurologically intact.  She was weaned from the ventilator using standard protocols without difficulty.  She initially required some inotropic support with dobutamine but this was able to be weaned without difficulty.  She was also started on Midodrine to assist with blood pressure.  She is not felt to be a candidate at this time for ACE inhibitor or ARB.  She does have some postoperative volume overload and is responding to diuretics.  She has had postoperative atrial fibrillation and has been started on an amiodarone drip per usual protocol.  She was transitioned to p.o. amiodarone and subsequently was chemically cardioverted to sinus rhythm with only occasional runs of afib. Metoprolol was uptitrated and additionally she was started on Coumadin.  At the time of discharge the patient was very stable.  Home health arrangements were also made to have  INR checked.  Additionally she did require home oxygen to be arranged with lower sats with ambulation in the qualifying range.  She has known severe COPD.      History of Present Illness  04/19/2019  Pulmonary/ 1st office eval/Jennyfer Nickolson  Chief Complaint  Patient presents with  . New Patient (Initial Visit)    Seen in the past by  Dr Ashok Cordia. She states she has been doing cardiac rehab at [facility] and having issues with DOE and low o2 sats.   Dyspnea:  Was walking in hallways prior to d/c with sats 87% so rec 2lpm sleeping and walking but did not  Use it that way and in rehab same problem so started on 2lpm but has been  weaned to 1pm  At home does dog walking up and down hills,  Finds talking and walking are tough/ has not noticed hoarseness  Cough: no Sleep: flat  SABA use: none  - has not helped. rec Try off lisinopril for now  Start Micardis 80 mg one daily x 4 weeks  Make sure you check your oxygen saturations at highest level of activity to be sure it stays over 90% and adjust upward to maintain this level if needed but remember to turn it back to previous settings when you stop (to conserve your supply).         05/31/2019  f/u ov/Draxton Luu re: GOLD II/ ex hypoxemia  Chief Complaint  Patient presents with  . Follow-up    Here to walk with POC to see how she tolerates.    Dyspnea:  Goal is to be able to do gxt x 2.3 mph x 30 min but uses 2lpm do to this at home does not check sats on or off 02 and uses a large tank for this purpose  Cough: minimal/ non prod Sleeping: bed flat, on side  SABA use: none  02: just with exertion rec Stiolto is 2 pffs each am x 15 days on a trial basis - go ahead and fill the prescription if you like it  Work on inhaler technique:  Make sure you check your oxygen saturations at highest level of activity to be sure it stays over 90% and adjust upward to maintain this level if needed but remember to turn it back to previous settings when you stop (to conserve your supply).    08/29/2019  f/u ov/Brixton Schnapp re:  GOLD II/ ex hypoxemia / both covid shots last rx 3 weeks prior to [place] Complaint  Patient presents with  . Follow-up    Breathing is much improved since the last visit. She does not have a rescue inhaler and would like to discuss getting one.   Dyspnea:  Does gxt  20mph x 30 min no  longer any 02 / no elevation  Cough: minimal dry Sleeping: flat/ one pillow  SABA use: n/a 02: turned it back in  rec Plan A = Automatic = Always=    Stiolto 2 pffs each am  Plan B = Backup (to supplement plan A, not to replace it) Only use your albuterol inhaler as a rescue medication    08/21/2020  f/u ov/Nation Cradle re:  No chief complaint on file.   Dyspnea:  *** Cough: *** Sleeping: *** SABA use: *** 02: *** Covid status:   ***   No obvious day to day or daytime variability or assoc excess/ purulent sputum or mucus plugs or hemoptysis or cp or chest tightness, subjective wheeze or overt sinus  or hb symptoms.   *** without nocturnal  or early am exacerbation  of respiratory  c/o's or need for noct saba. Also denies any obvious fluctuation of symptoms with weather or environmental changes or other aggravating or alleviating factors except as outlined above   No unusual exposure hx or h/o childhood pna/ asthma or knowledge of premature birth.  Current Allergies, Complete Past Medical History, Past Surgical History, Family History, and Social History were reviewed in [employer] record.  ROS  The following are not active complaints unless bolded Hoarseness, sore throat, dysphagia, dental problems, itching, sneezing,  nasal congestion or discharge of excess mucus or purulent secretions, ear ache,   fever, chills, sweats, unintended wt loss or wt gain, classically pleuritic or exertional cp,  orthopnea pnd or arm/hand swelling  or leg swelling, presyncope, palpitations, abdominal pain, anorexia, nausea, vomiting, diarrhea  or change in bowel habits or change in bladder habits, change in stools or change in urine, dysuria, hematuria,  rash, arthralgias, visual complaints, headache, numbness, weakness or ataxia or problems with walking or coordination,  change in mood or  memory.        No outpatient medications have been marked as taking for the 08/21/20 encounter  (Appointment) with Tanda Rockers, MD.                    Past Medical History:  Diagnosis Date  . Anemia    "several times over the years" (10/17/2012)  . Anxiety   . Breast cancer ([place]) 02/2010   right breast  . Carcinoma of breast treated with adjuvant hormone therapy ([place]) 9/11   Femara  . CHF (congestive heart failure) ([place])   . COPD (chronic obstructive pulmonary disease) ([place])   . Depression   . Exertional shortness of breath    "recently" (10/17/2012)  . GERD (gastroesophageal reflux disease)    diet controlled, no med  . Hollenhorst plaque, left eye    apparently resolved at recent ophthalmology visit  . Hyperlipidemia   . Hypertension   . 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 Left ventricular dysfunction   . Moderate aortic insufficiency   . Pneumonia    "that's why I'm here" (10/17/2012)       Objective:      08/21/2020          *** 08/29/2019          126  05/31/19 125 lb (56.7 kg)  05/24/19 125 lb 10.6 oz (57 kg)  04/26/19 122 lb 9.6 oz (55.6 kg)    Vital signs reviewed  08/21/2020  - Note at rest 02 sats  ***% on ***   General appearance:    ***       Min bar***          Assessment

## 2020-08-22 ENCOUNTER — Telehealth: Payer: Self-pay | Admitting: Internal Medicine

## 2020-08-22 MED ORDER — TRELEGY ELLIPTA 200-62.5-25 MCG/INH IN AEPB: 1.0000 | INHALATION_SPRAY | Freq: Every day | RESPIRATORY_TRACT | 0 refills | Status: DC

## 2020-08-22 NOTE — Telephone Encounter (Signed)
Call returned to patient, confirmed DOB. She voices her PCP gave her a sample of Trelegy 200. She reports it has made a significant difference in her breathing. She is requesting a prescription be sent to her pharmacy. Confirmed pharmacy on file. Made aware I would get this information to her provider and get back with her. Voiced understanding.   MW please advise if okay to send script for trelegy 200 to pharmacy. Thanks :)

## 2020-08-22 NOTE — Telephone Encounter (Signed)
Called and spoke with pt letting her know the info stated by MW and she verbalized understanding. Sample of Trelegy 200 has been placed up front for pt. Nothing further needed.

## 2020-08-22 NOTE — Telephone Encounter (Signed)
The trelegy 200 has 2 of the components she was already on in stiolto but if she likes it better this is ok short term but needs to keep the appt for 3/16 she already and ok to give sample and regroup then for long term planning if she wants my input on best options going forward.  In general the lowest effective dose is what we're seeking and trelegy 200 is the very highest dose but ok to take until ov  (ok to give one sample)

## 2020-08-27 ENCOUNTER — Ambulatory Visit: Payer: Medicare Other | Admitting: Internal Medicine

## 2020-08-27 ENCOUNTER — Telehealth: Payer: Self-pay

## 2020-08-27 NOTE — Telephone Encounter (Signed)
Spoke with pt regarding lexiscan results. Study explained to pt no ischemia shown and that it was a low risk study. Pt questions answered and pt verbalizes understanding. Pt concerned about pain that she has experienced in her left shoulder. Expressed to pt the importance of having the echo done and having a follow up appointment with Dr. [name] to decide next steps. Pt is in agreement and will keep both appointments. Pt verbalizes gratitude for this phone call.

## 2020-09-04 ENCOUNTER — Other Ambulatory Visit: Payer: Self-pay

## 2020-09-04 ENCOUNTER — Ambulatory Visit: Payer: Medicare Other | Admitting: Internal Medicine

## 2020-09-04 ENCOUNTER — Encounter: Payer: Self-pay | Admitting: Internal Medicine

## 2020-09-04 DIAGNOSIS — J449 Chronic obstructive pulmonary disease, unspecified: Secondary | ICD-10-CM | POA: Diagnosis not present

## 2020-09-04 MED ORDER — TRELEGY ELLIPTA 100-62.5-25 MCG/INH IN AEPB: 1.0000 | INHALATION_SPRAY | Freq: Every day | RESPIRATORY_TRACT | 0 refills | Status: DC

## 2020-09-04 MED ORDER — TRELEGY ELLIPTA 100-62.5-25 MCG/INH IN AEPB: INHALATION_SPRAY | RESPIRATORY_TRACT | 11 refills | Status: DC

## 2020-09-04 NOTE — Progress Notes (Signed)
JENET DURIO, female    DOB: 1940/06/28     MRN: [id number]   Brief patient profile:   64  yowf MM/quit smoking 1992 previously followed by Dr Ashok Cordia with  GOLD II copd    PFT 01/04/17: FVC 2.17 L (79%) FEV1 1.32 L (64%) FEV1/FVC 0.61 FEF 25-75 0.57 L (34%) negative bronchodilator response TLC 5.23 L (105%) RV 136% ERV 39% DLCO corrected 43% 12/30/12: FVC 2.77 L (101%) FEV1 1.92 L (89%) FEV1/FVC 0.69 FEF 25-75 1.13 L (51%) negative bronchodilator response TLC 4.77 L (97%) RV 110% DLCO uncorrected 51%  6MWT 03/08/17:  Walked 414 meters / Baseline Sat 96% on RA / Nadir Sat 94% on RA (stopped with 4:37 due to poor pulse ox signal)    CARDIAC TTE (07/14/16):  LV normal in size with EF 60-65% & no regional wall motion abnormalities. Grade 1 diastolic dysfunction. LA & RA normal in size. RV mildly dilated with reduced systolic function. Moderate aortic regurgitation without stenosis. Ascending aorta is mildly dilated with aortic root normal in size. No mitral stenosis or regurgitation. Mild pulmonic regurgitation. No significant tricuspid regurgitation. No pericardial effusion.    Admit date: 01/31/2019 Discharge date: 02/10/2019  Admission Diagnoses: Severe aortic insufficiency  Discharge Diagnoses:  Active Problems:   Aortic valve disease    hx The patient is a 80 year old female referred to Dr. [name] right for evaluation of severe aortic insufficiency.  She has been seen by her cardiologist for several years with serial echocardiogram.  Her most recent echo revealed severe AI with some LV dilatation and reduced LV systolic function.  She underwent right and left heart catheterization and was found to have no significant coronary artery disease.  Right heart catheterization showed normal pressures with mixed venous saturations of 68%.  LVEDP was normal and cardiac index was 2.9.  Pulmonary function studies were performed due to a history of smoking heavy smoking in the past,  although really remote.  Her FEV1 was 1.2 and DLCO was 40%.  A CT scan showed the ascending aorta measuring 4.0 cm.  There was findings of panlobular emphysema noted on the CT scan.  Dr. [name] evaluated the patient and all her studies and recommended proceeding with elective aortic valve replacement and she was admitted this hospitalization for the procedure.   Hospital Course: The patient was admitted electively and on 01/31/2019 taken to the operating room where she underwent Aortic valve replacement with a 21 mm Inspiris Edwards valve..  She tolerated it well and was taken to the surgical intensive care unit in stable condition.  Postoperative hospital course:  The patient has done well postoperatively.  She has remained neurologically intact.  She was weaned from the ventilator using standard protocols without difficulty.  She initially required some inotropic support with dobutamine but this was able to be weaned without difficulty.  She was also started on Midodrine to assist with blood pressure.  She is not felt to be a candidate at this time for ACE inhibitor or ARB.  She does have some postoperative volume overload and is responding to diuretics.  She has had postoperative atrial fibrillation and has been started on an amiodarone drip per usual protocol.  She was transitioned to p.o. amiodarone and subsequently was chemically cardioverted to sinus rhythm with only occasional runs of afib. Metoprolol was uptitrated and additionally she was started on Coumadin.  At the time of discharge the patient was very stable.  Home health arrangements were also made to have  INR checked.  Additionally she did require home oxygen to be arranged with lower sats with ambulation in the qualifying range.  She has known severe COPD.      History of Present Illness  04/19/2019  Pulmonary/ 1st office eval/Amoni Morales  Chief Complaint  Patient presents with   New Patient (Initial Visit)    Seen in the past by  Dr Ashok Cordia. She states she has been doing cardiac rehab at [facility] and having issues with DOE and low o2 sats.   Dyspnea:  Was walking in hallways prior to d/c with sats 87% so rec 2lpm sleeping and walking but did not  Use it that way and in rehab same problem so started on 2lpm but has been  weaned to 1pm  At home does dog walking up and down hills,  Finds talking and walking are tough/ has not noticed hoarseness  Cough: no Sleep: flat  SABA use: none  - has not helped. rec Try off lisinopril for now  Start Micardis 80 mg one daily x 4 weeks  Make sure you check your oxygen saturations at highest level of activity to be sure it stays over 90% and adjust upward to maintain this level if needed but remember to turn it back to previous settings when you stop (to conserve your supply).         05/31/2019  f/u ov/Calib Wadhwa re: GOLD II/ ex hypoxemia  Chief Complaint  Patient presents with   Follow-up    Here to walk with POC to see how she tolerates.    Dyspnea:  Goal is to be able to do gxt x 2.3 mph x 30 min but uses 2lpm do to this at home does not check sats on or off 02 and uses a large tank for this purpose  Cough: minimal/ non prod Sleeping: bed flat, on side  SABA use: none  02: just with exertion rec Stiolto is 2 pffs each am x 15 days on a trial basis - go ahead and fill the prescription if you like it  Work on inhaler technique:  Make sure you check your oxygen saturations at highest level of activity to be sure it stays over 90% and adjust upward to maintain this level if needed but remember to turn it back to previous settings when you stop (to conserve your supply).    08/29/2019  f/u ov/Damary Doland re:  GOLD II/ ex hypoxemia / both covid shots last rx 3 weeks prior to [place] Complaint  Patient presents with   Follow-up    Breathing is much improved since the last visit. She does not have a rescue inhaler and would like to discuss getting one.   Dyspnea:  Does gxt  19mph x 30 min no  longer any 02 / no elevation  Cough: minimal dry Sleeping: flat/ one pillow  SABA use: n/a 02: turned it back in  rec Plan A = Automatic = Always=    Stiolto 2 pffs each am  Plan B = Backup (to supplement plan A, not to replace it) Only use your albuterol inhaler as a rescue medication    09/04/2020  f/u ov/Xiara Knisley re: GOLD II copd / trelegy 200 ? making her hoarse (changed at rec of pcp during ? Flare "I though I had pna"  - never used saba during "flare"  Chief Complaint  Patient presents with   Follow-up    Had chest tightness approx 1 month ago- PCP put her on Trelegy  200 and this has helped.    Dyspnea:  Treadmill x 30 min 3 x per week, not elevation /not checking sats  Cough: none/ some hoarseness on trelegy 200 Sleeping: flat fine/ one pillow  SABA use: doesn't have one  02: none  Covid status:   vax x 3    No obvious day to day or daytime variability or assoc excess/ purulent sputum or mucus plugs or hemoptysis or cp or chest tightness, subjective wheeze or overt sinus or hb symptoms.   Sleeping now  without nocturnal  or early am exacerbation  of respiratory  c/o's or need for noct saba. Also denies any obvious fluctuation of symptoms with weather or environmental changes or other aggravating or alleviating factors except as outlined above   No unusual exposure hx or h/o childhood pna/ asthma or knowledge of premature birth.  Current Allergies, Complete Past Medical History, Past Surgical History, Family History, and Social History were reviewed in [employer] record.  ROS  The following are not active complaints unless bolded Hoarseness, sore throat, dysphagia, dental problems, itching, sneezing,  nasal congestion or discharge of excess mucus or purulent secretions, ear ache,   fever, chills, sweats, unintended wt loss or wt gain, classically pleuritic or exertional cp,  orthopnea pnd or arm/hand swelling  or leg swelling, presyncope, palpitations,  abdominal pain, anorexia, nausea, vomiting, diarrhea  or change in bowel habits or change in bladder habits, change in stools or change in urine, dysuria, hematuria,  rash, arthralgias, visual complaints, headache, numbness, weakness or ataxia or problems with walking or coordination,  change in mood or  memory.        Current Meds  Medication Sig   atorvastatin (LIPITOR) 20 MG tablet Take 20 mg by mouth daily.    carvedilol (COREG) 6.25 MG tablet TAKE 1 AND 1/2 TABLETS(9.375 MG) BY MOUTH TWICE DAILY WITH A MEAL   cholecalciferol (VITAMIN D3) 25 MCG (1000 UT) tablet Take 1,000 Units by mouth daily.   denosumab (PROLIA) 60 MG/ML SOSY injection Inject 60 mg into the skin every 6 (six) months.   diltiazem (CARDIZEM) 30 MG tablet Take 1 tablet every 4 hours AS NEEDED for AFIB heart rate >100   ELIQUIS 5 MG TABS tablet TAKE 1 TABLET(5 MG) BY MOUTH TWICE DAILY   fluticasone (FLONASE) 50 MCG/ACT nasal spray Place 2 sprays into both nostrils daily.   Fluticasone-Umeclidin-Vilant (TRELEGY ELLIPTA) 200-62.5-25 MCG/INH AEPB Inhale 1 puff into the lungs daily.   guaiFENesin (MUCINEX) 600 MG 12 hr tablet Take 1 tablet (600 mg total) by mouth 2 (two) times daily.   sertraline (ZOLOFT) 100 MG tablet Taking by mouth 100mg  in the am and 50mg  in the pm   vitamin B-12 (CYANOCOBALAMIN) 500 MCG tablet Take 500 mcg by mouth daily.                    Past Medical History:  Diagnosis Date   Anemia    "several times over the years" (10/17/2012)   Anxiety    Breast cancer ([place]) 02/2010   right breast   Carcinoma of breast treated with adjuvant hormone therapy ([place]) 9/11   Femara   CHF (congestive heart failure) ([facility])    COPD (chronic obstructive pulmonary disease) ([facility])    Depression    Exertional shortness of breath    "recently" (10/17/2012)   GERD (gastroesophageal reflux disease)    diet controlled, no med   Hollenhorst plaque, left eye    apparently  resolved at recent  ophthalmology visit   Hyperlipidemia    Hypertension    IDC, RightStage II, 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   NECK :  without JVD/Nodes/TM/ nl carotid upstrokes bilaterally   LUNGS: no acc muscle use,  Min barrel  contour chest wall with bilateral  slightly decreased bs s audible wheeze and  without cough on insp or exp maneuvers and min  Hyperresonant  to  percussion bilaterally     CV:  RRR  no s3 or murmur or increase in P2, and no edema   ABD:  soft and nontender with pos end  insp Hoover's  in the supine position. No bruits or organomegaly appreciated, bowel sounds nl  MS:   Nl gait/  ext warm without deformities, calf tenderness, cyanosis or clubbing No obvious joint restrictions   SKIN: warm and dry without lesions    NEURO:  alert, approp, nl sensorium with  no motor or cerebellar deficits apparent.               Assessment

## 2020-09-04 NOTE — Patient Instructions (Signed)
Plan A = Automatic = Always=    trelegy 924 one click each am   Brush teeth with arm and hammer tootpaste and gargle with the slurry   Plan B = Backup (to supplement plan A, not to replace it) Only use your albuterol inhaler as a rescue medication to be used if you can't catch your breath/chest tightness/wheezing/increased  by resting or doing a relaxed purse lip breathing pattern.  - The less you use it, the better it will work when you need it. - Ok to use the inhaler up to 2 puffs  every 4 hours if you must but call for appointment if use goes up over your usual need - Don't leave home without it !!  (think of it like the spare tire for your car)    Please schedule a follow up visit in 12  months but call sooner if needed

## 2020-09-05 ENCOUNTER — Encounter: Payer: Self-pay | Admitting: Internal Medicine

## 2020-09-05 MED ORDER — ALBUTEROL SULFATE HFA 108 (90 BASE) MCG/ACT IN AERS: INHALATION_SPRAY | RESPIRATORY_TRACT | Status: DC

## 2020-09-05 NOTE — Assessment & Plan Note (Addendum)
Quit smoking 1992  01/04/17: FVC 2.17 L (79%) FEV1 1.32 L (64%) FEV1/FVC 0.61 FEF 25-75 0.57 L (34%) negative bronchodilator response TLC 5.23 L (105%) RV 136% ERV 39% DLCO corrected 43% - alpha one AT  03/08/17   MM  Level 164  - PFT's  01/05/2019  FEV1 1.30 (64 % ) ratio 0.62  p 6 % improvement from saba p ? prior to study with DLCO  7.27 (39%) corrects to 2.15 (52%)  for alv volume and FV curve classic concave pattern - 05/31/2019  After extensive coaching inhaler device,  effectiveness =    75% (delayed insp) with SMI > try stiolto 2 puffs each am > improved - 09/04/2020 changed to trelegy200 during  [place] > rec trelegy 100 maint   If she is indeed having tendency of aecopd on stiolto, needs trelegy 100 daily but if causing more upper airway irritation vs stiolto also ok to go back on it but remember saba rx  I spent extra time with pt today reviewing appropriate use of albuterol for prn use on exertion with the following points: 1) saba is for relief of sob that does not improve by walking a slower pace or resting but rather if the pt does not improve after trying this first. 2) If the pt is convinced, as many are, that saba helps recover from activity faster then it's easy to tell if this is the case by re-challenging : ie stop, take the inhaler, then p 5 minutes try the exact same activity (intensity of workload) that just caused the symptoms and see if they are substantially diminished or not after saba 3) if there is an activity that reproducibly causes the symptoms, try the saba 15 min before the activity on alternate days   If in fact the saba really does help, then fine to continue to use it prn but advised may need to look closer at the maintenance regimen being used to achieve better control of airways disease with exertion.   - The proper method of use, as well as anticipated side effects, of a metered-dose inhaler were discussed and demonstrated to the patient using teach back method.             Each maintenance medication was reviewed in detail including emphasizing most importantly the difference between maintenance and prns and under what circumstances the prns are to be triggered using an action plan format where appropriate.  Total time for H and P, chart review, counseling, reviewing elipta  device(s) and generating customized AVS unique to this office visit / same day charting = 25 min

## 2020-09-25 ENCOUNTER — Other Ambulatory Visit: Payer: Self-pay

## 2020-09-25 ENCOUNTER — Ambulatory Visit (HOSPITAL_COMMUNITY): Payer: Medicare Other | Attending: Cardiology

## 2020-09-25 DIAGNOSIS — I351 Nonrheumatic aortic (valve) insufficiency: Secondary | ICD-10-CM | POA: Insufficient documentation

## 2020-09-25 DIAGNOSIS — I359 Nonrheumatic aortic valve disorder, unspecified: Secondary | ICD-10-CM | POA: Insufficient documentation

## 2020-09-25 DIAGNOSIS — Z952 Presence of prosthetic heart valve: Secondary | ICD-10-CM

## 2020-09-25 LAB — ECHOCARDIOGRAM COMPLETE
AR max vel: 0.91 cm2
AV Area VTI: 1.04 cm2
AV Area mean vel: 0.96 cm2
AV Mean grad: 10.5 mmHg
AV Peak grad: 19.4 mmHg
Ao pk vel: 2.2 m/s
Area-P 1/2: 1.72 cm2
P 1/2 time: 423 msec
S' Lateral: 2.8 cm

## 2020-09-27 ENCOUNTER — Telehealth: Payer: Self-pay

## 2020-09-27 ENCOUNTER — Other Ambulatory Visit: Payer: Self-pay

## 2020-09-27 ENCOUNTER — Encounter: Payer: Self-pay | Admitting: Cardiovascular Disease

## 2020-09-27 ENCOUNTER — Ambulatory Visit: Payer: Medicare Other | Admitting: Cardiovascular Disease

## 2020-09-27 VITALS — BP 153/88 | HR 65 | Ht 64.0 in | Wt 124.4 lb

## 2020-09-27 DIAGNOSIS — Z952 Presence of prosthetic heart valve: Secondary | ICD-10-CM | POA: Diagnosis not present

## 2020-09-27 NOTE — Telephone Encounter (Signed)
Pt had questions about sending preventice monitor back. Instructions to help per Markus Daft.  Will forward these to the pt.

## 2020-09-27 NOTE — Patient Instructions (Signed)
Medication Instructions:  Your physician recommends that you continue on your current medications as directed. Please refer to the Current Medication list given to you today.  *If you need a refill on your cardiac medications before your next appointment, please call your pharmacy*   Follow-Up: At [facility], you and your health needs are our priority.  As part of our continuing mission to provide you with exceptional heart care, we have created designated Provider Care Teams.  These Care Teams include your primary Cardiologist (physician) and Advanced Practice Providers (APPs -  Physician Assistants and Nurse Practitioners) who all work together to provide you with the care you need, when you need it.  We recommend signing up for the patient portal called "MyChart".  Sign up information is provided on this After Visit Summary.  MyChart is used to connect with patients for Virtual Visits (Telemedicine).  Patients are able to view lab/test results, encounter notes, upcoming appointments, etc.  Non-urgent messages can be sent to your provider as well.   To learn more about what you can do with MyChart, go to [email or web address].    Your next appointment:   3 month(s)  The format for your next appointment:   In Person  Provider:   You will see one of the following Advanced Practice Providers on your designated Care Team:    Sande Rives, PA-C  Then, Quay Burow, MD will plan to see you again in 6 month(s).

## 2020-09-27 NOTE — Progress Notes (Signed)
Dana Pruitt returns for follow-up of her outpatient test.  She did see Dana Rives, Dana Pruitt in the office back in February for an episode of back and shoulder blade pain which awakened her from sleep.  This happened twice.  She had a 2D echo performed 09/25/2020 which was essentially normal with normal EF and a well-functioning aortic bioprosthesis.  A Myoview stress test performed 08/20/2020 was low risk.  When she had a heart catheterization done before her aortic valve replacement she had minimal CAD.  I have reassured her that her symptoms were probably not cardiac.  I will have her see Dana Rives, Dana Pruitt back in the office in 3 months I will see her back in 6 months.  Dana Pruitt, M.D., [place], [facility], [place] [place] [facility] [address]. [place], [place]  [address]  [phone] 09/27/2020 11:54 AM

## 2020-10-05 IMAGING — DX DG CHEST 1V PORT
1 series · 1 of 1 positions shown · non-contrast
Comparison: Chest radiograph dated 03/08/2019

CLINICAL DATA: Shortness of breath

EXAM:
PORTABLE CHEST 1 VIEW

[chest]
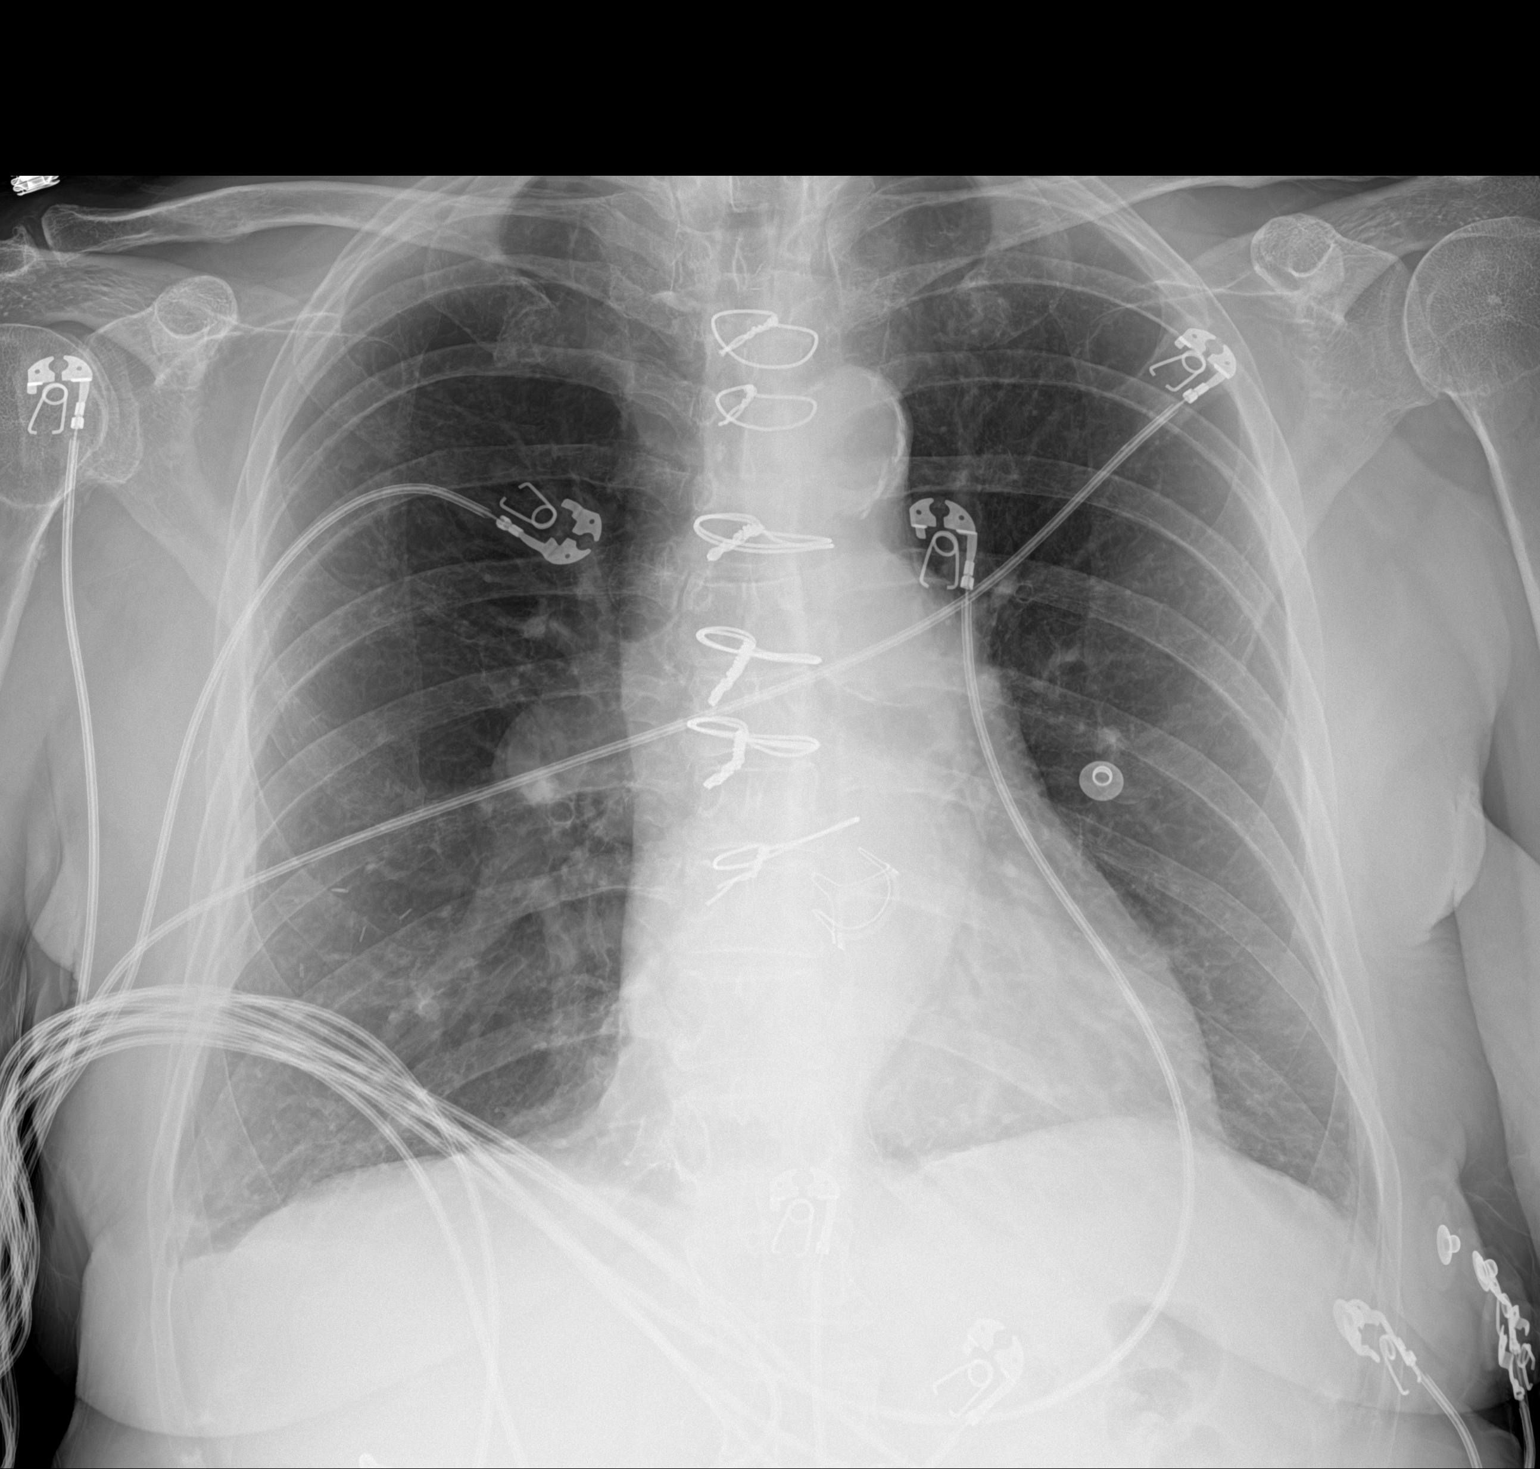

[1 of 1 positions shown; findings below may reference images not displayed]

FINDINGS: Median sternotomy wires and aortic valve replacement are noted. The
heart size is normal. Vascular calcifications are seen in the aortic
arch. Both lungs are clear. The visualized skeletal structures are
unremarkable.
IMPRESSION: No active pulmonary disease.

## 2020-10-10 DIAGNOSIS — Z1231 Encounter for screening mammogram for malignant neoplasm of breast: Secondary | ICD-10-CM | POA: Diagnosis not present

## 2020-10-14 ENCOUNTER — Other Ambulatory Visit (HOSPITAL_COMMUNITY): Payer: Medicare Other

## 2020-10-14 DIAGNOSIS — M810 Age-related osteoporosis without current pathological fracture: Secondary | ICD-10-CM | POA: Diagnosis not present

## 2020-11-14 DIAGNOSIS — H40013 Open angle with borderline findings, low risk, bilateral: Secondary | ICD-10-CM | POA: Diagnosis not present

## 2020-11-27 DIAGNOSIS — U071 COVID-19: Secondary | ICD-10-CM | POA: Diagnosis not present

## 2020-11-27 DIAGNOSIS — I480 Paroxysmal atrial fibrillation: Secondary | ICD-10-CM | POA: Diagnosis not present

## 2020-12-16 ENCOUNTER — Other Ambulatory Visit (HOSPITAL_COMMUNITY): Payer: Self-pay | Admitting: Cardiovascular Disease

## 2020-12-16 NOTE — Telephone Encounter (Signed)
108f, 56.4kg, scr 1.25 03/13/20, lovw/berry 09/27/20

## 2020-12-23 NOTE — Progress Notes (Addendum)
Cardiology Office Note:    Date:  12/30/2020   ID:  Dana Pruitt, DOB 02/25/1941, MRN [id number]  PCP:  Deland Pretty, MD  Cardiologist:  Quay Burow, MD  Electrophysiologist:  None   Referring MD: Deland Pretty, MD   Chief Complaint: routine follow-up of non-obstructive CAD, chronic systolic CHF, and aortic insufficiency   History of Present Illness:    Dana Pruitt is a 80 y.o. female with a history of mild non-obstructive CAD on cardiac cath in 12/2018, severe aortic insufficiency s/p AVR in 05/4579, chronic systolic CHF/non-ischemic cardiomyopathy with normalization of EF, paroxysmal atrial fibrillation on Eliquis, COPD, hypertension, hyperlipidemia, GERD, anxiety/depression, and breast cancer s/p chemotherapy/radiation and  lumpectomy in 2013  who is followed by Dr. [name] and presents today for routine follow-up.   Patient was referred to Dr. [name] in 2015 after Echo showed LVEF of 35-40% with moderate aortic insuffiencey. She was started on beta-blocker for her cardiomyopathy and EF eventually normalized to to 60-65% on repeat Echo in 06/2016. Aortic disease progressed and Echo in 09/2018 showed LVEF of 40-45 with diffuse hypokinesis, grade 1 diastolic dysfunction, and severe AI.She was admitted in 12/2018 with chest pain and shortness of breath. She underwent R/LHC at that time which showed mild non-obstructive CAD, wide aortic pulse pressure suggestive of significant AI, preserved cardiac output, and low filling pressures. Patient ultimately underwent AVR with 70m Inspiris Edwards valve by Dr. VPrescott Gumon 01/31/2019. She did develop post-op atrial fibrillation but converted back to sinus rhythm after initiation of Amiodarone. She was discharge on Amiodarone and Coumadin both of which were able to be discontinued later. However, she did have recurrent episodes of atrial fibrillation. Most recent Echo in 09/2019 LVEF of 55-60% with well functioning AVR. [facility] and was started on Eliquis. Her Coreg was decreased at that time due to dizziness.   I saw the patient in 07/2020 for further evaluation of left shoulder pain with associated nausea and near syncope as well as several episodes of dizziness. Lexiscan Myoview and Event Monitor were ordered for further evaluation. Myoview was low risk with no evidence of ischemia. Dizziness actually improved after starting a new inhaler so patient requested holding off on Event Monitor for now. Routine Echo in 09/2020 for monitoring of AI showed LVEF of 60-65% with grade 1 diastolic dysfunction, and well functioning aortic bioprosthesis. Also showed mild MR and mild dilatation of the ascending aorta measuring 42 mm. She was seen by Dr. BGwenlyn Foundin 09/2020 at which time she was stable from a cardiac standpoint. Prior symptoms not felt to be cardiac in nature.  Patient presents today for follow-up. Patient doing well since last visit. No recurrent shoulder pain. No chest pain. She notes very minimal shortness of breath with exertion which is stable. No orthopnea, PND, or lower extremity edema. She reports some occasional lightheadedness not necessarily related to position changes. She was driving with a friends a couple of weeks ago and had an episode of lightheadedness and actually pulled over and let her friend drive. Episode lasted about 20 minutes. Patient is normally symptomatic with her atrial fibrillation and denies any palpitations during these times. No falls or syncope. She thinks it may be related to her sinus issues and maybe being a little dehydrated as well.   Past Medical History:  Diagnosis Date   Anemia    "several times over the years" (10/17/2012)   Anxiety  Breast cancer ([facility]) 02/2010   right breast   Carcinoma of breast treated with adjuvant hormone therapy ([place]) 9/11   [place]   CHF (congestive heart failure) (HCC)    COPD (chronic obstructive pulmonary disease)  (HCC)    Depression    Exertional shortness of breath    "recently" (10/17/2012)   GERD (gastroesophageal reflux disease)    diet controlled, no med   Hollenhorst plaque, left eye    apparently resolved at recent ophthalmology visit   Hyperlipidemia    Hypertension    IDC, RightStage II, 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 SN: [id number];  Surgeon: Prescott Gum, Collier Salina, MD;  Location: [facility];  Service: Open Heart Surgery;  Laterality: N/A;   APPENDECTOMY  1974   BREAST BIOPSY Right 02/27/10   Needle core Biopsy; Invasive Mammary; ER/PR Positive, Her-2 Neu negative, Ki-67 22%   BREAST LUMPECTOMY WITH SENTINEL LYMPH NODE BIOPSY Right 07/03/11   Invasive Ductal Carcinoma;0/3 nodes negative,; ER,PR Positive, Her-2 Neg; Ki-67 22%   CESAREAN SECTION  1970; 1974   COLONOSCOPY     DILATION AND CURETTAGE OF UTERUS  1970's   RIGHT/LEFT HEART CATH AND CORONARY ANGIOGRAPHY N/A 01/02/2019   Procedure: RIGHT/LEFT HEART CATH AND CORONARY ANGIOGRAPHY;  Surgeon: Larey Dresser, MD;  Location: [facility];  Service: Cardiovascular;  Laterality: N/A;   TEE WITHOUT CARDIOVERSION N/A 01/31/2019   Procedure: TRANSESOPHAGEAL ECHOCARDIOGRAM (TEE);  Surgeon: Prescott Gum, Collier Salina, MD;  Location: [facility];  Service: Open Heart Surgery;  Laterality: N/A;   TUBAL LIGATION  1974   WISDOM TOOTH EXTRACTION      Current Medications: Current Meds  Medication Sig   albuterol (PROAIR HFA) 108 (90 Base) MCG/ACT inhaler 2 puffs every 4 hours as needed only  if your can't catch your breath   atorvastatin (LIPITOR) 20 MG tablet Take 20 mg by mouth daily.    carvedilol (COREG) 6.25 MG tablet TAKE 1  AND 1/2 TABLETS(9.375 MG) BY MOUTH TWICE DAILY WITH A MEAL   cholecalciferol (VITAMIN D3) 25 MCG (1000 UT) tablet Take 1,000 Units by mouth daily.   denosumab (PROLIA) 60 MG/ML SOSY injection Inject 60 mg into the skin every 6 (six) months.   diltiazem (CARDIZEM) 30 MG tablet Take 1 tablet every 4 hours AS NEEDED for AFIB heart rate >100   ELIQUIS 5 MG TABS tablet TAKE 1 TABLET(5 MG) BY MOUTH TWICE DAILY   fluticasone (FLONASE) 50 MCG/ACT nasal spray Place 2 sprays into both nostrils daily.   Fluticasone-Umeclidin-Vilant (TRELEGY ELLIPTA) 100-62.5-25 MCG/INH AEPB One click eah am   guaiFENesin (MUCINEX) 600 MG 12 hr tablet Take 1 tablet (600 mg total) by mouth 2 (two) times daily.   sertraline (ZOLOFT) 100 MG tablet Taking by mouth 141m in the am and 510min the pm   vitamin B-12 (CYANOCOBALAMIN) 500 MCG tablet Take 500 mcg by mouth daily.     Allergies:   Patient has no known allergies.   Social History   Socioeconomic History   Marital status: Married    Spouse name: Not on file   Number of children: 2   Years of education: college  Highest education level: Not on file  Occupational History    Employer: RETIRED  Tobacco Use   Smoking status: Former    Packs/day: 3.00    Years: 30.00    Pack years: 90.00    Types: Cigarettes    Start date: 05/19/1958    Quit date: 06/30/1990    Years since quitting: 30.5   Smokeless tobacco: Never   Tobacco comments:    Stopped smoking for 2-3 years during second child.   Vaping Use   Vaping Use: Never used  Substance and Sexual Activity   Alcohol use: Yes    Comment: 1-2 glasses wine/month   Drug use: No   Sexual activity: Yes    Birth control/protection: Post-menopausal    Comment: Menarche age 43, G48,P2, Menopause in 43"s, No HRT Married  Other Topics Concern   Not on file  Social History Narrative   [facility] (12/29/16):   Originally from [place], [place]. Moved to [place] at 80 y.o. Does have a dog. Hasn't worked outside the  home. Remote parakeet exposure from her children. No mold or hot tub exposure. Enjoys doing yard work & walking.    Social Determinants of Health   Financial Resource Strain: Not on file  Food Insecurity: Not on file  Transportation Needs: Not on file  Physical Activity: Not on file  Stress: Not on file  Social Connections: Not on file     Family History: The patient's family history includes Cancer in her father and paternal grandmother; Heart disease in her maternal grandfather, maternal grandmother, and mother. There is no history of Lung disease.  ROS:   Please see the history of present illness.     EKGs/Labs/Other Studies Reviewed:    The following studies were reviewed today:  Right/Left Cardiac Catheterization 01/02/2019: 1. Low filling pressures and normal PA pressure. 2. Preserved cardiac output. 3. Wide aortic pulse pressure is suggestive of significant aortic insufficiency.  Aortic root shot was not done due to CKD (GFR around 30). 4. Mild nonobstructive CAD.  30 cc contrast used.  _______________  Leane Call 08/20/2020: The left ventricular ejection fraction is hyperdynamic (>65%). Nuclear stress EF: 68%. No T wave inversion was noted during stress. There was no ST segment deviation noted during stress. This is a low risk study.   No ischemia. LVEF 68% with normal wall motion. This is a low risk study. _______________  Echocardiogram 09/25/2020: Impressions: 1. Left ventricular ejection fraction, by estimation, is 60 to 65%. The  left ventricle has normal function. The left ventricle has no regional  wall motion abnormalities. There is mild left ventricular hypertrophy of  the basal-septal segment. Left  ventricular diastolic parameters are consistent with Grade I diastolic  dysfunction (impaired relaxation).   2. Right ventricular systolic function is normal. The right ventricular  size is normal. There is normal pulmonary artery systolic pressure. The   estimated right ventricular systolic pressure is 50.3 mmHg.   3. The mitral valve is normal in structure. Mild mitral valve  regurgitation. No evidence of mitral stenosis.   4. The aortic valve has been repaired/replaced. There is a 21 mm Inspiris  Resilia valve present in the aortic position. Aortic valve regurgitation  is not visualized. No aortic stenosis is present. Aortic valve mean  gradient measures 10.5 mmHg. Aortic  valve Vmax measures 2.20 m/s.   5. There is mild dilatation of the ascending aorta, measuring 42 mm.   6. The inferior vena cava is normal in size with greater  than 50%  respiratory variability, suggesting right atrial pressure of 3 mmHg.  EKG:  EKG not ordered today.  Recent Labs: 02/18/2020: B Natriuretic Peptide 650.1; Magnesium 1.9; TSH 1.666 03/13/2020: BUN 17; Creatinine, Ser 1.25; Potassium 4.4; Sodium 141 08/09/2020: Hemoglobin 13.1; Platelets 248  Recent Lipid Panel    Component Value Date/Time   CHOL 140 12/17/2018 0430   CHOL 197 02/16/2014 0826   TRIG 103 12/17/2018 0430   HDL 38 (L) 12/17/2018 0430   HDL 52 02/16/2014 0826   CHOLHDL 3.7 12/17/2018 0430   VLDL 21 12/17/2018 0430   LDLCALC 81 12/17/2018 0430   LDLCALC 127 (H) 02/16/2014 0826    Physical Exam:    Vital Signs: BP 118/66 (BP Location: Left Arm, Patient Position: Sitting, Cuff Size: Normal)   Pulse 75   Ht 5' 4"  (1.626 m)   Wt 121 lb 9.6 oz (55.2 kg)   LMP  (LMP Unknown)   SpO2 96%   BMI 20.87 kg/m     Wt Readings from Last 3 Encounters:  12/30/20 121 lb 9.6 oz (55.2 kg)  09/27/20 124 lb 6.4 oz (56.4 kg)  09/04/20 122 lb (55.3 kg)     General: 80 y.o. Caucasian female in no acute distress. HEENT: Normocephalic and atraumatic. Sclera clear.  Neck: Supple. No carotid bruits. No JVD. Heart: RRR. Distinct S1 and S2. No murmurs, gallops, or rubs. Radial pulses 2+ and equal bilaterally. Lungs: No increased work of breathing. Clear to ausculation bilaterally. No wheezes,  rhonchi, or rales.  Abdomen: Soft, non-distended, and non-tender to palpation.  Extremities: No lower extremity edema.    Skin: Warm and dry. Neuro: Alert and oriented x3. No focal deficits. Psych: Normal affect. Responds appropriately.  Assessment:    1. Coronary artery disease involving native coronary artery of native heart without angina pectoris   2. Nonischemic cardiomyopathy (HCC)   3. Paroxysmal atrial fibrillation ([place])   4. Severe aortic insufficiency   5. S/P AVR   6. Mitral valve insufficiency, unspecified etiology   7. Aortic dilatation (HCC)   8. Primary hypertension   9. Hyperlipidemia, unspecified hyperlipidemia type     Plan:    Non-Obstructive CAD - Nonobstructive CAD noted on cardiac cath in 12/2018. Myoview in 08/2020 low risk with no evidence of ischemia.  - No recurrent shoulder pain. No chest pain. - No aspirin due to need for DOAC. - Continue beta-blocker and statin.   Non-Ischemic Cardiomyopathy Chronic Systolic CHF - LVEF as low as 35-40% in 2015 but has since normalized. Last Echo in 09/2020 showed LVEF of LVEF of 60-65% with mild LVH of basal-septal segment and grade 1 diastolic dysfunction. - Appears euvolemic on exam. - Continue beta-blocker.    Paroxysmal Atrial Fibrillation - EKG not obtained today but regular on exam.  - Continue Coreg 9.324m twice daily. - Continue Cardizem 3719mPRN for rapid atrial fibrillation. Has not needed this since last visit. - Continue Eliquis 19m54mwice daily.  Lightheadedness - Patient reports some intermittent lightheadedness. No palpitations, dizziness, or syncope. - Patient wondering if it is related to sinus issues. Advised patient that she can try OTC allergy medication such as Zyrtec or Claritin to see if that helps.  - Also encouraged patient to make sure she stays well hydrated. Can also try compressions stockings.  - Patient is usually symptomatic with her atrial fibrillation and denies any palpitations  with these episodes. Previously offered monitor but patient would still like to hold off on this.   Severe  Aortic Insufficiency s/p AVR in 04/2019 - Most recent Echo in 09/2020 showed LVEF of 60-65% with normal structure and function of aortic valve prosthesis. - Plan is for repeat Echo in 09/2021. - Continue SBE prophylaxis prior to dental procedures.   Mild Mitral Regurgitation - Noted on Echo in 09/2020. - Can continue to monitor on serial Echos for aortic insufficiency as above. Plan is for repeat Echo in 09/2021.  Aortic Dilatation - Mild dilatation of ascending aorta measuring 42 mm noted on Echo in 09/2020. Not mentioned on Echo in 09/2019.  - Discussed following with repeat Echo in 1 year or getting chest CTA for further evaluation. Patient would like me to see what Dr. [name] recommends. Will talk with Dr. [name] and then update patient.   Hypertension - BP well controlled. - Continue Coreg 9.332m twice daily.   Hyperlipidemia - Lipid panel in 12/2019: Total Cholesterol 134, Triglycerides 75, HDL 48, LDL 71. - Continue Lipitor 266mdaily. - Labs followed by PCP.  Disposition: Follow up in 6 months.  ADDENDUM 01/02/2021 1:38PM: Discussed with Dr. BeGwenlyn Foundbout dilatation of ascending aorta. Per Dr. BeGwenlyn Foundonly mildly dilated so can continue to follow with Echo for now. If it continues to enlarge, can consider CTA at that time. Called and updated patient.   Medication Adjustments/Labs and Tests Ordered: Current medicines are reviewed at length with the patient today.  Concerns regarding medicines are outlined above.  No orders of the defined types were placed in this encounter.  No orders of the defined types were placed in this encounter.   Patient Instructions  Medication Instructions:  No changes *If you need a refill on your cardiac medications before your next appointment, please call your pharmacy*   Lab Work: No labs If you have labs (blood work) drawn today and your  tests are completely normal, you will receive your results only by: MyLake Tappsif you have MyChart) OR A paper copy in the mail If you have any lab test that is abnormal or we need to change your treatment, we will call you to review the results.   Testing/Procedures: No Testing   Follow-Up: [facility].  As part of our continuing mission to provide you with exceptional heart care, we have created designated Provider Care Teams.  These Care Teams include your primary Cardiologist (physician) and Advanced Practice Providers (APPs -  Physician Assistants and Nurse Practitioners) who all work together to provide you with the care you need, when you need it.  We recommend signing up for the patient portal called "MyChart".  Sign up information is provided on this After Visit Summary.  MyChart is used to connect with patients for Virtual Visits (Telemedicine).  Patients are able to view lab/test results, encounter notes, upcoming appointments, etc.  Non-urgent messages can be sent to your provider as well.   To learn more about what you can do with MyChart, go to [email or web address]   Your next appointment:   6 month(s)  The format for your next appointment:   In Person  Provider:   JoQuay BurowMD       Signed, Dana McleanPA-C  12/30/2020 12:34 PM    [place]

## 2020-12-30 ENCOUNTER — Other Ambulatory Visit: Payer: Self-pay

## 2020-12-30 ENCOUNTER — Encounter: Payer: Self-pay | Admitting: Student

## 2020-12-30 ENCOUNTER — Ambulatory Visit: Payer: Medicare Other | Admitting: Student

## 2020-12-30 VITALS — BP 118/66 | HR 75 | Ht 64.0 in | Wt 121.6 lb

## 2020-12-30 DIAGNOSIS — I77819 Aortic ectasia, unspecified site: Secondary | ICD-10-CM

## 2020-12-30 DIAGNOSIS — I2510 Atherosclerotic heart disease of native coronary artery without angina pectoris: Secondary | ICD-10-CM

## 2020-12-30 DIAGNOSIS — I428 Other cardiomyopathies: Secondary | ICD-10-CM | POA: Diagnosis not present

## 2020-12-30 DIAGNOSIS — I351 Nonrheumatic aortic (valve) insufficiency: Secondary | ICD-10-CM

## 2020-12-30 DIAGNOSIS — R42 Dizziness and giddiness: Secondary | ICD-10-CM | POA: Diagnosis not present

## 2020-12-30 DIAGNOSIS — I480 Paroxysmal atrial fibrillation: Secondary | ICD-10-CM | POA: Diagnosis not present

## 2020-12-30 DIAGNOSIS — E785 Hyperlipidemia, unspecified: Secondary | ICD-10-CM

## 2020-12-30 DIAGNOSIS — Z952 Presence of prosthetic heart valve: Secondary | ICD-10-CM

## 2020-12-30 DIAGNOSIS — I10 Essential (primary) hypertension: Secondary | ICD-10-CM

## 2020-12-30 DIAGNOSIS — I340 Nonrheumatic mitral (valve) insufficiency: Secondary | ICD-10-CM

## 2020-12-30 NOTE — Patient Instructions (Signed)
Medication Instructions:  No changes *If you need a refill on your cardiac medications before your next appointment, please call your pharmacy*   Lab Work: No labs If you have labs (blood work) drawn today and your tests are completely normal, you will receive your results only by: [place] (if you have MyChart) OR A paper copy in the mail If you have any lab test that is abnormal or we need to change your treatment, we will call you to review the results.   Testing/Procedures: No Testing   Follow-Up: At [facility], you and your health needs are our priority.  As part of our continuing mission to provide you with exceptional heart care, we have created designated Provider Care Teams.  These Care Teams include your primary Cardiologist (physician) and Advanced Practice Providers (APPs -  Physician Assistants and Nurse Practitioners) who all work together to provide you with the care you need, when you need it.  We recommend signing up for the patient portal called "MyChart".  Sign up information is provided on this After Visit Summary.  MyChart is used to connect with patients for Virtual Visits (Telemedicine).  Patients are able to view lab/test results, encounter notes, upcoming appointments, etc.  Non-urgent messages can be sent to your provider as well.   To learn more about what you can do with MyChart, go to [email or web address].    Your next appointment:   6 month(s)  The format for your next appointment:   In Person  Provider:   Quay Burow, MD

## 2021-01-24 DIAGNOSIS — M810 Age-related osteoporosis without current pathological fracture: Secondary | ICD-10-CM | POA: Diagnosis not present

## 2021-01-24 DIAGNOSIS — I10 Essential (primary) hypertension: Secondary | ICD-10-CM | POA: Diagnosis not present

## 2021-01-24 DIAGNOSIS — E7801 Familial hypercholesterolemia: Secondary | ICD-10-CM | POA: Diagnosis not present

## 2021-01-29 DIAGNOSIS — I480 Paroxysmal atrial fibrillation: Secondary | ICD-10-CM | POA: Diagnosis not present

## 2021-01-29 DIAGNOSIS — Z0000 Encounter for general adult medical examination without abnormal findings: Secondary | ICD-10-CM | POA: Diagnosis not present

## 2021-01-29 DIAGNOSIS — I272 Pulmonary hypertension, unspecified: Secondary | ICD-10-CM | POA: Diagnosis not present

## 2021-02-06 DIAGNOSIS — Z780 Asymptomatic menopausal state: Secondary | ICD-10-CM | POA: Diagnosis not present

## 2021-02-06 DIAGNOSIS — M85851 Other specified disorders of bone density and structure, right thigh: Secondary | ICD-10-CM | POA: Diagnosis not present

## 2021-02-06 DIAGNOSIS — M85852 Other specified disorders of bone density and structure, left thigh: Secondary | ICD-10-CM | POA: Diagnosis not present

## 2021-02-10 DIAGNOSIS — D485 Neoplasm of uncertain behavior of skin: Secondary | ICD-10-CM | POA: Diagnosis not present

## 2021-02-10 DIAGNOSIS — B078 Other viral warts: Secondary | ICD-10-CM | POA: Diagnosis not present

## 2021-02-10 DIAGNOSIS — Z85828 Personal history of other malignant neoplasm of skin: Secondary | ICD-10-CM | POA: Diagnosis not present

## 2021-02-19 DIAGNOSIS — E7800 Pure hypercholesterolemia, unspecified: Secondary | ICD-10-CM | POA: Diagnosis not present

## 2021-02-19 DIAGNOSIS — I10 Essential (primary) hypertension: Secondary | ICD-10-CM | POA: Diagnosis not present

## 2021-02-19 DIAGNOSIS — J449 Chronic obstructive pulmonary disease, unspecified: Secondary | ICD-10-CM | POA: Diagnosis not present

## 2021-03-13 DIAGNOSIS — M810 Age-related osteoporosis without current pathological fracture: Secondary | ICD-10-CM | POA: Diagnosis not present

## 2021-03-13 DIAGNOSIS — Z6821 Body mass index (BMI) 21.0-21.9, adult: Secondary | ICD-10-CM | POA: Diagnosis not present

## 2021-03-13 DIAGNOSIS — E559 Vitamin D deficiency, unspecified: Secondary | ICD-10-CM | POA: Diagnosis not present

## 2021-03-21 DIAGNOSIS — E7800 Pure hypercholesterolemia, unspecified: Secondary | ICD-10-CM | POA: Diagnosis not present

## 2021-03-21 DIAGNOSIS — I10 Essential (primary) hypertension: Secondary | ICD-10-CM | POA: Diagnosis not present

## 2021-03-21 DIAGNOSIS — J449 Chronic obstructive pulmonary disease, unspecified: Secondary | ICD-10-CM | POA: Diagnosis not present

## 2021-04-16 DIAGNOSIS — M810 Age-related osteoporosis without current pathological fracture: Secondary | ICD-10-CM | POA: Diagnosis not present

## 2021-04-28 DIAGNOSIS — E349 Endocrine disorder, unspecified: Secondary | ICD-10-CM | POA: Diagnosis not present

## 2021-04-28 DIAGNOSIS — R42 Dizziness and giddiness: Secondary | ICD-10-CM | POA: Diagnosis not present

## 2021-04-28 DIAGNOSIS — R790 Abnormal level of blood mineral: Secondary | ICD-10-CM | POA: Diagnosis not present

## 2021-04-28 DIAGNOSIS — R55 Syncope and collapse: Secondary | ICD-10-CM | POA: Diagnosis not present

## 2021-05-29 ENCOUNTER — Ambulatory Visit (HOSPITAL_BASED_OUTPATIENT_CLINIC_OR_DEPARTMENT_OTHER): Payer: Medicare Other | Admitting: Nurse Practitioner

## 2021-05-29 ENCOUNTER — Other Ambulatory Visit: Payer: Self-pay

## 2021-05-29 ENCOUNTER — Encounter (HOSPITAL_BASED_OUTPATIENT_CLINIC_OR_DEPARTMENT_OTHER): Payer: Self-pay | Admitting: Nurse Practitioner

## 2021-05-29 VITALS — BP 136/96 | HR 68 | Ht 64.0 in | Wt 125.0 lb

## 2021-05-29 DIAGNOSIS — R42 Dizziness and giddiness: Secondary | ICD-10-CM

## 2021-05-29 DIAGNOSIS — Z952 Presence of prosthetic heart valve: Secondary | ICD-10-CM | POA: Diagnosis not present

## 2021-05-29 DIAGNOSIS — I77819 Aortic ectasia, unspecified site: Secondary | ICD-10-CM

## 2021-05-29 DIAGNOSIS — I480 Paroxysmal atrial fibrillation: Secondary | ICD-10-CM

## 2021-05-29 DIAGNOSIS — E785 Hyperlipidemia, unspecified: Secondary | ICD-10-CM

## 2021-05-29 DIAGNOSIS — I10 Essential (primary) hypertension: Secondary | ICD-10-CM

## 2021-05-29 NOTE — Patient Instructions (Signed)
Medication Instructions:  Your Physician recommend you continue on your current medication as directed.    *If you need a refill on your cardiac medications before your next appointment, please call your pharmacy*   Lab Work: None ordered today    Testing/Procedures: Your physician has requested that you have a carotid duplex. This test is an ultrasound of the carotid arteries in your neck. It looks at blood flow through these arteries that supply the brain with blood. Allow one hour for this exam. There are no restrictions or special instructions.    Follow-Up: At [facility], you and your health needs are our priority.  As part of our continuing mission to provide you with exceptional heart care, we have created designated Provider Care Teams.  These Care Teams include your primary Cardiologist (physician) and Advanced Practice Providers (APPs -  Physician Assistants and Nurse Practitioners) who all work together to provide you with the care you need, when you need it.  We recommend signing up for the patient portal called "MyChart".  Sign up information is provided on this After Visit Summary.  MyChart is used to connect with patients for Virtual Visits (Telemedicine).  Patients are able to view lab/test results, encounter notes, upcoming appointments, etc.  Non-urgent messages can be sent to your provider as well.   To learn more about what you can do with MyChart, go to [email or web address].    Your next appointment:   March 7th at 11:30  The format for your next appointment:   In Person  Provider:   Quay Burow, MD     Other Instructions- Please monitory your blood pressure and pulse for 1 week and then give [place] an update on mychart  Tips to Measure your Blood Pressure Correctly  To determine whether you have hypertension, a medical professional will take a blood pressure reading. How you prepare for the test, the position of your arm, and other factors can change a  blood pressure reading by 10% or more. That could be enough to hide high blood pressure, start you on a drug you don't really need, or lead your doctor to incorrectly adjust your medications.  National and international guidelines offer specific instructions for measuring blood pressure. If a doctor, nurse, or medical assistant isn't doing it right, don't hesitate to ask him or her to get with the guidelines.  Here's what you can do to ensure a correct reading:  Don't drink a caffeinated beverage or smoke during the 30 minutes before the test.  Sit quietly for five minutes before the test begins.  During the measurement, sit in a chair with your feet on the floor and your arm supported so your elbow is at about heart level.  The inflatable part of the cuff should completely cover at least 80% of your upper arm, and the cuff should be placed on bare skin, not over a shirt.  Don't talk during the measurement.  Have your blood pressure measured twice, with a brief break in between. If the readings are different by 5 points or more, have it done a third time.  In 2017, new guidelines from the [employer], the [employer] of Cardiology, and nine other health organizations lowered the diagnosis of high blood pressure to 130/80 mm Hg or higher for all adults. The guidelines also redefined the various blood pressure categories to now include normal, elevated, Stage 1 hypertension, Stage 2 hypertension, and hypertensive crisis (see "Blood pressure categories").  Blood pressure categories  Blood pressure category SYSTOLIC (upper number)  DIASTOLIC (lower number)  Normal Less than 120 mm Hg and Less than 80 mm Hg  Elevated 120-129 mm Hg and Less than 80 mm Hg  High blood pressure: Stage 1 hypertension 130-139 mm Hg or 80-89 mm Hg  High blood pressure: Stage 2 hypertension 140 mm Hg or higher or 90 mm Hg or higher  Hypertensive crisis (consult your doctor immediately) Higher than 180 mm  Hg and/or Higher than 120 mm Hg  Source: American Heart Association and American Stroke Association. For more on getting your blood pressure under control, buy Controlling Your Blood Pressure, a Special Health Report from [facility].   Blood Pressure Log   Date   Time  Blood Pressure  Position  Example: Nov 1 9 AM 124/78 sitting                                                      Please increase your protein intake and hydration!  Protein Content in Foods Protein is a necessary nutrient in any diet. It helps build and repair muscles, bones, and skin. Depending on your overall health, you may need more or less protein in your diet. You are encouraged to eat a variety of protein foods to ensure that you get all the essential nutrients that are found in different protein foods. Talk with your health care provider or dietitian about how much protein you need each day and which sources of protein are best for you. Protein is especially important for: Repairing and making cells and tissues. Fighting infection. Providing energy. Growth and development. See the following list for the protein content of some common foods. What are tips for getting more protein in your diet? Try to replace processed carbohydrates with high-quality protein. Snack on nuts and seeds instead of chips. Replace baked desserts with [place] yogurt. Eat protein foods from both plant and animal sources. Replace red meat with seafood. Add beans and peas to salads, soups, and side dishes. Include a protein food with each meal and snack. Reading food labels You can find the amount of protein in a food item by looking at the nutrition facts label. Use the total grams listed to help you reach your daily goal. What foods are high in protein? High-protein foods contain 4 grams (g) or more of protein per serving. They include: Grains Quinoa (cooked) -- 1 cup (185 g) has 8 g of protein. Whole  wheat pasta (cooked) -- 1 cup (140 g) has 6 g of protein. Meat Beef, ground sirloin (cooked) -- 3 oz (85 g) has 24 g of protein. Chicken breast, boneless and skinless (cooked) -- 3 oz (85 g) has 25 g of protein. Egg -- 1 egg has 6 g of protein. Fish, filet (cooked) -- 1 oz (28 g) has 6-7 g of protein. Lamb (cooked) -- 3 oz (85 g) has 24 g of protein. Pork tenderloin (cooked) -- 3 oz (85 g) has 23 g of protein. Tuna (canned in water) -- 3 oz (85 g) has 20 g of protein. Dairy Cottage cheese --  cup (114 g) has 13.4 g of protein. Milk -- 1 cup (237 mL) has 8 g of protein. Cheese (hard) -- 1 oz (28 g) has 7 g of protein. Yogurt, regular -- 6 oz (170 g) has  8 g of protein. Greek yogurt -- 6 oz (200 g) has 18 g protein. Plant protein Garbanzo beans (canned or cooked) --  cup (130 g) has 6-7 g of protein. Kidney beans (canned or cooked) --  cup (130 g) has 6-7 g of protein. Nuts (peanuts, pistachios, almonds) -- 1 oz (28 g) has 6 g of protein. Peanut butter -- 1 oz (32 g) has 7-8 g of protein. Pumpkin seeds -- 1 oz (28 g) has 8.5 g of protein. Soybeans (roasted) -- 1 oz (28 g) has 8 g of protein. Soybeans (cooked) --  cup (90 g) has 11 g of protein. Soy milk -- 1 cup (250 mL) has 5-10 g of protein. Soy or vegetable patty -- 1 patty has 11 g of protein. Sunflower seeds -- 1 oz (28 g) has 5.5 g of protein. Buckwheat -- 1 oz (33 g) has 4.3 g of protein. Tofu (firm) --  cup (124 g) has 20 g of protein. Tempeh --  cup (83 g) has 16 g of protein. The items listed above may not be a complete list of foods high in protein. Actual amounts of protein may differ depending on processing. Contact a dietitian for more information. What foods are low in protein? Low-protein foods contain 3 grams (g) or less of protein per serving. They include: Fruits Fruit or vegetable juice --  cup (125 mL) has 1 g of protein. Vegetables Beets (raw or cooked) --  cup (68 g) has 1.5 g of protein. Broccoli (raw  or cooked) --  cup (44 g) has 2 g of protein. Collard greens (raw or cooked) --  cup (42 g) has 2 g of protein. Green beans (raw or cooked) --  cup (83 g) has 1 g of protein. Green peas (canned) --  cup (80 g) has 3.5 g of protein. Potato (baked with skin) -- 1 medium potato (173 g) has 3 g of protein. Spinach (cooked) --  cup (90 g) has 3 g of protein. Squash (cooked) --  cup (90 g) has 1.5 g of protein. Avocado -- 1 cup (146 g) has 2.7 g of protein. Grains Bran cereal --  cup (30 g) has 2-3 g of protein. Bread -- 1 slice has 2.5 g of protein. Corn (fresh or cooked) --  cup (77 g) has 2 g of protein. Flour tortilla -- One 6-inch (15 cm) tortilla has 2.5 g of protein. Muffins -- 1 small muffin (2 oz or 57 g) has 3 g of protein. Oatmeal (cooked) --  cup (40 g) has 3 g of protein. Rice (cooked) --  cup (79 g) has 2.5-3.5 g of protein. Dairy Cream cheese -- 1 oz (29 g) has 2 g of protein. Creamer (half-and-half) -- 1 oz (29 mL) has 1 g of protein. Frozen yogurt --  cup (72 g) has 3 g of protein. Sour cream --  cup (75 g) has 2.5 g of protein. The items listed above may not be a complete list of foods low in protein. Actual amounts of protein may differ depending on processing. Contact a dietitian for more information. Summary Protein is a nutrient that your body needs for growth and development, repairing and making cells and tissues, fighting infection, and providing energy. Protein is in both plant and animal foods. Some of these foods have more protein than others. Depending on your overall health, you may need more or less protein in your diet. Talk to your health care provider about how much protein  you need. This information is not intended to replace advice given to you by your health care provider. Make sure you discuss any questions you have with your health care provider. Document Revised: 05/13/2020 Document Reviewed: 05/13/2020 Elsevier Patient Education  2022  [employer].

## 2021-05-29 NOTE — Progress Notes (Signed)
Cardiology Office Note:    Date:  05/29/2021   ID:  Dana Pruitt, DOB [birth year], MRN [id number]  PCP:  Dana Pretty, MD   [facility] Providers Cardiologist:  Quay Burow, MD     Referring MD: Dana Pretty, MD   Chief Complaint: feeling like I'm going to faint  History of Present Illness:    Dana Pruitt is a 80 y.o. female with a hx of CAD, severe aortic regurgitation, s/p aortic valve replacement, pulmonary hypertension, paroxysmal atrial fibrillation on chronic anticoagulation, chronic systolic CHF, hypertension, COPD Gold II, breast cancer, hyperlipidemia  She established care with our group in 2015 after an echocardiogram showed LV EF of 35 to 40% with moderate aortic insufficiency. She was started on beta-blocker for her cardiomyopathy and EF eventually normalized to 60 to 65% by echo 1/18.  Echo 4/20 revealed aortic disease had progressed and her LVEF was 40 to 45% with diffuse hypokinesis, grade 1 diastolic dysfunction, and severe AI.  She was admitted in July 2020 with chest pain and shortness of breath at which time she underwent a right and left heart catheterization that showed mild nonobstructive coronary artery disease, wide aortic pulse pressure suggestive of significant AI, preserved cardiac output, and low filling pressures.  She underwent AVR with 21 mm Inspiris Edwards valve by Dr. Prescott Pruitt on 01/31/2019.  She developed postop atrial fibrillation but converted back to sinus rhythm with initiation of amiodarone.  She was discharged on amiodarone and Coumadin both of which were able to be discontinued later.  She later had additional episodes of atrial fibrillation and was seen by Dana Palau, NP in the A Fib clinic 9/21 and was started on Eliquis. Her Coreg was decreased at that time due to dizziness. She was seen for an episode of near syncope February 2022 and nuclear stress test and event monitor were ordered . Her stress test was low risk.  Her dizziness  resolved after starting a new inhaler and so she did not pursue the event monitor at that time. Her most recent echocardiogram from 4-22 shows LVEF 60 to 65% no regional wall motion abnormalities mild LVH mild mitral regurgitation, prosthetic aortic valve functioning well, mild dilatation of the ascending aorta measuring 42 mm.   She was last seen in our office on 12/30/20 by Dana Rives, PA.  The mild dilatation of her ascending aorta was discussed with Dr. Henrene Pruitt, patient's primary cardiologist and it was felt that it could be continue to be followed with echocardiogram for now.  If it continues to enlarge we will consider CTA at that time.  33-monthfollow-up was recommended.  Today, she is here alone for evaluation of pre-syncope. She reports that on at least 2 occasions in the last month she felt like she was going to pass out and needed to sit down to prevent falling. One of these episodes occurred while she was walking her dog and one occurred while she was grocery shopping.  On both occasions she was able to safely sit on the ground and she did not sustain any injury. She has difficulty describing the way she feels when this occurs except that she starts to feel weak and feels like she is going to pass out. On other occasions she has had a funny feeling in her head and has stopped what she was doing to rest and the feeling resolves. On one occasion she was driving and she pulled over and let her friend drive. She denies loss of consciousness.  She admits that she forgets to drink during the day and may have 4 cups of water in a day.  She also admits that she does not eat enough calories on most days. She denies chest pain, shortness of breath, lower extremity edema, fatigue, palpitations, melena, hematuria, hemoptysis, diaphoresis, weakness, presyncope, syncope, orthopnea, and PND.  Past Medical History:  Diagnosis Date   Anemia    "several times over the years" (10/17/2012)   Anxiety    Breast  cancer ([place]) 02/2010   right breast   Carcinoma of breast treated with adjuvant hormone therapy ([place]) 9/11   Femara   CHF (congestive heart failure) (HCC)    COPD (chronic obstructive pulmonary disease) (HCC)    Depression    Exertional shortness of breath    "recently" (10/17/2012)   GERD (gastroesophageal reflux disease)    diet controlled, no med   Hollenhorst plaque, left eye    apparently resolved at recent ophthalmology visit   Hyperlipidemia    Hypertension    IDC, RightStage II, 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 SN: [id number];  Surgeon: Dana Pruitt, Collier Salina, MD;  Location: [facility];  Service: Open Heart Surgery;  Laterality: N/A;   APPENDECTOMY  1974   BREAST BIOPSY Right 02/27/10   Needle core Biopsy; Invasive Mammary; ER/PR Positive, Her-2 Neu negative, Ki-67 22%   BREAST LUMPECTOMY WITH SENTINEL LYMPH NODE BIOPSY Right 07/03/11   Invasive Ductal Carcinoma;0/3 nodes negative,; ER,PR Positive, Her-2 Neg; Ki-67 22%   CESAREAN SECTION  1970; 1974   COLONOSCOPY     DILATION AND CURETTAGE OF UTERUS  1970's   RIGHT/LEFT HEART CATH AND CORONARY ANGIOGRAPHY N/A 01/02/2019   Procedure: RIGHT/LEFT HEART CATH AND CORONARY ANGIOGRAPHY;  Surgeon: Larey Dresser, MD;  Location: [facility];  Service: Cardiovascular;  Laterality: N/A;   TEE WITHOUT CARDIOVERSION N/A 01/31/2019   Procedure: TRANSESOPHAGEAL ECHOCARDIOGRAM (TEE);  Surgeon: Dana Pruitt, Collier Salina, MD;  Location: [facility];  Service: Open Heart Surgery;  Laterality: N/A;   TUBAL LIGATION  1974   WISDOM TOOTH EXTRACTION      Current Medications: Current Meds  Medication Sig   albuterol (PROAIR HFA) 108  (90 Base) MCG/ACT inhaler 2 puffs every 4 hours as needed only  if your can't catch your breath   atorvastatin (LIPITOR) 20 MG tablet Take 20 mg by mouth daily.    carvedilol (COREG) 6.25 MG tablet TAKE 1 AND 1/2 TABLETS(9.375 MG) BY MOUTH TWICE DAILY WITH A MEAL   denosumab (PROLIA) 60 MG/ML SOSY injection Inject 60 mg into the skin every 6 (six) months.   diltiazem (CARDIZEM) 30 MG tablet Take 1 tablet every 4 hours AS NEEDED for AFIB heart rate >100   ELIQUIS 5 MG TABS tablet TAKE 1 TABLET(5 MG) BY MOUTH TWICE DAILY   fluticasone (FLONASE) 50 MCG/ACT nasal spray Place 2 sprays into both nostrils daily.   Fluticasone-Umeclidin-Vilant (TRELEGY ELLIPTA) 100-62.5-25 MCG/INH AEPB One click eah am   guaiFENesin (MUCINEX) 600 MG 12 hr tablet Take 1 tablet (600 mg total) by mouth 2 (two) times daily.   sertraline (ZOLOFT) 100 MG tablet Taking by mouth 17m in the am and  52m in the pm     Allergies:   Patient has no known allergies.   Social History   Socioeconomic History   Marital status: Married    Spouse name: Not on file   Number of children: 2   Years of education: college   Highest education level: Not on file  Occupational History    Employer: RETIRED  Tobacco Use   Smoking status: Former    Packs/day: 3.00    Years: 30.00    Pack years: 90.00    Types: Cigarettes    Start date: 05/19/1958    Quit date: 06/30/1990    Years since quitting: 30.9   Smokeless tobacco: Never   Tobacco comments:    Stopped smoking for 2-3 years during second child.   Vaping Use   Vaping Use: Never used  Substance and Sexual Activity   Alcohol use: Yes    Comment: 1-2 glasses wine/month   Drug use: No   Sexual activity: Yes    Birth control/protection: Post-menopausal    Comment: Menarche age 89 GG6,P2 Menopause in 557"s No HRT Married  Other Topics Concern   Not on file  Social History Narrative   [facility] (12/29/16):   [place] 80y.o. Does  have a dog. Hasn't worked outside the home. Remote parakeet exposure from her children. No mold or hot tub exposure. Enjoys doing yard work & walking.    Social Determinants of Health   Financial Resource Strain: Not on file  Food Insecurity: Not on file  Transportation Needs: Not on file  Physical Activity: Not on file  Stress: Not on file  Social Connections: Not on file     Family History: The patient's family history includes Cancer in her father and paternal grandmother; Heart disease in her maternal grandfather, maternal grandmother, and mother. There is no history of Lung disease.  ROS:   Please see the history of present illness. All other systems reviewed and are negative.  Labs/Other Studies Reviewed:    The following studies were reviewed today:  Echo 09/25/20  Left Ventricle: Left ventricular ejection fraction, by estimation, is 60  to 65%. The left ventricle has normal function. The left ventricle has no  regional wall motion abnormalities. The left ventricular internal cavity  size was normal in size. There is mild left ventricular hypertrophy of the basal-septal segment. Left ventricular diastolic parameters are consistent with Grade I diastolic dysfunction (impaired relaxation). Normal left ventricular filling pressure.  Right Ventricle: The right ventricular size is normal. No increase in  right ventricular wall thickness. Right ventricular systolic function is  normal. There is normal pulmonary artery systolic pressure. The tricuspid regurgitant velocity is 2.69 m/s, and  with an assumed right atrial pressure of 3 mmHg, the estimated right ventricular systolic pressure is 310.6mmHg.  Left Atrium: Left atrial size was normal in size.  Right Atrium: Right atrial size was normal in size.  Pericardium: There is no evidence of pericardial effusion.  Mitral Valve: The mitral valve is normal in structure. Mild mitral valve  regurgitation. No evidence of mitral valve  stenosis.  Tricuspid Valve: The tricuspid valve is normal in structure. Tricuspid  valve regurgitation is mild . No evidence of tricuspid stenosis.  Aortic Valve: The aortic valve has been repaired/replaced. Aortic valve  regurgitation is not visualized. Aortic regurgitation PHT measures 423  msec. No aortic stenosis is present. Aortic valve mean gradient measures 10.5 mmHg. Aortic valve peak gradient  measures 19.4 mmHg. Aortic valve area, by VTI measures 1.04 cm. There is  a 21 mm Inspiris Resilia valve present in the aortic position.  Pulmonic Valve: The pulmonic valve was normal in structure. Pulmonic valve regurgitation is mild to moderate. No evidence of pulmonic stenosis.  Aorta: The aortic root is normal in size and structure. There is mild  dilatation of the ascending aorta, measuring 42 mm.  Venous: The inferior vena cava is normal in size with greater than 50%  respiratory variability, suggesting right atrial pressure of 3 mmHg.  IAS/Shunts: No atrial level shunt detected by color flow Doppler.   Lexiscan Myoview 3/22  The left ventricular ejection fraction is hyperdynamic (>65%). Nuclear stress EF: 68%. No T wave inversion was noted during stress. There was no ST segment deviation noted during stress. This is a low risk study.   No ischemia. LVEF 68% with normal wall motion. This is a low risk study.    R/LHC 7/20  Low filling pressures and normal PA pressure.  2. Preserved cardiac output.  3. Wide aortic pulse pressure is suggestive of significant aortic insufficiency.  Aortic root shot was not done due to CKD (GFR around 30).  4. Mild nonobstructive CAD.  30 cc contrast used.    Recent Labs: 08/09/2020: Hemoglobin 13.1; Platelets 248  Epic from PCP records 04/28/21: K+ 4.6, Creat 1.07, Glucose 92, protein 6.2, albumin 4.3, mag 2.1, phos 2.7, TSH 1.65, hgb 12.5, platelet 216 01/24/21 LDL 81, HDL 48, Trigs 119 Recent Lipid Panel    Component Value Date/Time   CHOL 140  12/17/2018 0430   CHOL 197 02/16/2014 0826   TRIG 103 12/17/2018 0430   HDL 38 (L) 12/17/2018 0430   HDL 52 02/16/2014 0826   CHOLHDL 3.7 12/17/2018 0430   VLDL 21 12/17/2018 0430   LDLCALC 81 12/17/2018 0430   LDLCALC 127 (H) 02/16/2014 0826     Risk Assessment/Calculations:    CHA2DS2-VASc Score = 4   This indicates a 4.8% annual risk of stroke. The patient's score is based upon: CHF History: 0 HTN History: 1 Diabetes History: 0 Stroke History: 0 Vascular Disease History: 0 Age Score: 2 Gender Score: 1         Physical Exam:    VS:  BP (!) 136/96   Pulse 68   Ht 5' 4"  (1.626 m)   Wt 125 lb (56.7 kg)   LMP  (LMP Unknown)   BMI 21.46 kg/m     Wt Readings from Last 3 Encounters:  05/29/21 125 lb (56.7 kg)  12/30/20 121 lb 9.6 oz (55.2 kg)  09/27/20 124 lb 6.4 oz (56.4 kg)     GEN:  Well nourished, well developed in no acute distress HEENT: Normal NECK: No JVD; No carotid bruits LYMPHATICS: No lymphadenopathy CARDIAC: RRR, Crisp systolic 2/6 murmur best auscultated at left sternal border. No rubs, gallops RESPIRATORY:  Clear to auscultation without rales, wheezing or rhonchi  ABDOMEN: Soft, non-tender, non-distended MUSCULOSKELETAL:  No edema; No deformity  SKIN: Warm and dry NEUROLOGIC:  Alert and oriented x 3 PSYCHIATRIC:  Normal affect   EKG:  EKG is ordered today.  The ekg ordered today demonstrates NSR with occasional PAC @ rate of 68 bpm. Nonspecific ST/TW changes in lead II. No symptoms of angina. Stable in comparison to previous.   Diagnoses:    1. Dizziness   2. Essential hypertension   3. S/P AVR (aortic valve replacement)   4. Aortic dilatation (HCC)   5. PAF (  paroxysmal atrial fibrillation) ([place])   6. Hyperlipidemia LDL goal <70    Assessment and Plan:     Pre-syncope/Dizziness: Over the past month she has had at least 2 episodes of pre-syncope. She does not describe frank dizziness but reports it is a strange feeling and she knows she  needs to sit. Does not note worsening symptoms at a particular time of day. She admits that she forgets to drink during the day and may have 4 cups of water.  She also admits that she does not eat enough calories on most days. Thinks she is feeling better this week since she has made more of an effort to increase calorie intake and maintain hydration.Encouraged increased intake of protein and improved hydration. Offered suggestions of setting reminders or putting visuals around the house to remind her. Will check carotid dopplers for carotid stenosis that may be contributing. Advised her to monitor BP at least 2 hours after medication to better assess for orthostatic hypotension.  Discussed wearing a cardiac monitor. She would like to see how she feels over the next few weeks since she is increasing efforts for better nutrition or hydration. Would favor a 14 or 30 day monitor if she remains symptomatic. She is going to call with BP and HR readings in one week. Would consider reduction in carvedilol if BP or HR are low.  PAF on chronic anticoagulation: She denies bleeding concerns. Does not feel that she has had atrial fibrillation recently. Feels that it has occurred only a few times since her diagnosis in 2020. No concerns for worsening symptoms of palpitations or SOB. Has not taken prn diltiazem. Continue carvedilol, Eliquis.   Essential hypertension: Consideration of reducing carvedilol but would like to have a week's worth of BP and pulse readings before changing. She is going to monitor and send to [place] in 1 week. Will reduce carvedilol to 6.25 mg if she is hypotensive.   CAD native without angina: She has mild to moderate nonobstructive by [facility] and cCT 7/20. She denies chest discomfort or SOB. Remains active at home doing house and yard work. Does not note an increase in symptoms with exertion. She had low risk lexiscan myoview 3/22. Continue statin, beta blocker. Continue to monitor.    Severe AI s/p  AVR: Most recent echo 4/22 shows normal prosthetic valve function, LVEF 60-65%. Do not appreciate a significant murmur today and BP does not reveal a widened pulse pressure. No concerns at this time for worsening valve function. Continue to follow with regular echocardiograms.   Hyperlipidemia LDL goal < 70: Lipids measured by PCP 8/22, LDL 81. Continue lipitor. Would consider discussion of adding Zetia at next office visit.   Aortic dilatation: Stable. Mild dilatation measuring 42 mm seen on echo 4/22. Measurement by coronary CT 6/20 was 4.1 cm. Will continue to monitor. Need to advise her to avoid fluoroquinolone antibiotics in the future.   Disposition: Carotid u/s. Review home BP/HR readings. 3 months with Dr. [name]   Medication Adjustments/Labs and Tests Ordered: Current medicines are reviewed at length with the patient today.  Concerns regarding medicines are outlined above.  Orders Placed This Encounter  Procedures   VAS US CAROTID    No orders of the defined types were placed in this encounter.   Patient Instructions  Medication Instructions:  Your Physician recommend you continue on your current medication as directed.    *If you need a refill on your cardiac medications before your next appointment, please call your pharmacy*  Lab Work: None ordered today    Testing/Procedures: Your physician has requested that you have a carotid duplex. This test is an ultrasound of the carotid arteries in your neck. It looks at blood flow through these arteries that supply the brain with blood. Allow one hour for this exam. There are no restrictions or special instructions.    Follow-Up: At [facility], you and your health needs are our priority.  As part of our continuing mission to provide you with exceptional heart care, we have created designated Provider Care Teams.  These Care Teams include your primary Cardiologist (physician) and Advanced Practice Providers (APPs -   Physician Assistants and Nurse Practitioners) who all work together to provide you with the care you need, when you need it.  We recommend signing up for the patient portal called "MyChart".  Sign up information is provided on this After Visit Summary.  MyChart is used to connect with patients for Virtual Visits (Telemedicine).  Patients are able to view lab/test results, encounter notes, upcoming appointments, etc.  Non-urgent messages can be sent to your provider as well.   To learn more about what you can do with MyChart, go to [email or web address].    Your next appointment:   March 7th at 11:30  The format for your next appointment:   In Person  Provider:   Quay Burow, MD     Other Instructions- Please monitory your blood pressure and pulse for 1 week and then give [place] an update on mychart  Tips to Measure your Blood Pressure Correctly  To determine whether you have hypertension, a medical professional will take a blood pressure reading. How you prepare for the test, the position of your arm, and other factors can change a blood pressure reading by 10% or more. That could be enough to hide high blood pressure, start you on a drug you don't really need, or lead your doctor to incorrectly adjust your medications.  National and international guidelines offer specific instructions for measuring blood pressure. If a doctor, nurse, or medical assistant isn't doing it right, don't hesitate to ask him or her to get with the guidelines.  Here's what you can do to ensure a correct reading:  Don't drink a caffeinated beverage or smoke during the 30 minutes before the test.  Sit quietly for five minutes before the test begins.  During the measurement, sit in a chair with your feet on the floor and your arm supported so your elbow is at about heart level.  The inflatable part of the cuff should completely cover at least 80% of your upper arm, and the cuff should be placed on bare skin, not  over a shirt.  Don't talk during the measurement.  Have your blood pressure measured twice, with a brief break in between. If the readings are different by 5 points or more, have it done a third time.  In 2017, new guidelines from the [place], the [employer] of Cardiology, and nine other health organizations lowered the diagnosis of high blood pressure to 130/80 mm Hg or higher for all adults. The guidelines also redefined the various blood pressure categories to now include normal, elevated, Stage 1 hypertension, Stage 2 hypertension, and hypertensive crisis (see "Blood pressure categories").  Blood pressure categories  Blood pressure category SYSTOLIC (upper number)  DIASTOLIC (lower number)  Normal Less than 120 mm Hg and Less than 80 mm Hg  Elevated 120-129 mm Hg and Less than 80 mm Hg  High blood pressure: Stage 1 hypertension 130-139 mm Hg or 80-89 mm Hg  High blood pressure: Stage 2 hypertension 140 mm Hg or higher or 90 mm Hg or higher  Hypertensive crisis (consult your doctor immediately) Higher than 180 mm Hg and/or Higher than 120 mm Hg  Source: American Heart Association and American Stroke Association. For more on getting your blood pressure under control, buy Controlling Your Blood Pressure, a Special Health Report from [facility].   Blood Pressure Log   Date   Time  Blood Pressure  Position  Example: Nov 1 9 AM 124/78 sitting                                                      Please increase your protein intake and hydration!  Protein Content in Foods Protein is a necessary nutrient in any diet. It helps build and repair muscles, bones, and skin. Depending on your overall health, you may need more or less protein in your diet. You are encouraged to eat a variety of protein foods to ensure that you get all the essential nutrients that are found in different protein foods. Talk with your health care provider or  dietitian about how much protein you need each day and which sources of protein are best for you. Protein is especially important for: Repairing and making cells and tissues. Fighting infection. Providing energy. Growth and development. See the following list for the protein content of some common foods. What are tips for getting more protein in your diet? Try to replace processed carbohydrates with high-quality protein. Snack on nuts and seeds instead of chips. Replace baked desserts with [place] yogurt. Eat protein foods from both plant and animal sources. Replace red meat with seafood. Add beans and peas to salads, soups, and side dishes. Include a protein food with each meal and snack. Reading food labels You can find the amount of protein in a food item by looking at the nutrition facts label. Use the total grams listed to help you reach your daily goal. What foods are high in protein? High-protein foods contain 4 grams (g) or more of protein per serving. They include: Grains Quinoa (cooked) -- 1 cup (185 g) has 8 g of protein. Whole wheat pasta (cooked) -- 1 cup (140 g) has 6 g of protein. Meat Beef, ground sirloin (cooked) -- 3 oz (85 g) has 24 g of protein. Chicken breast, boneless and skinless (cooked) -- 3 oz (85 g) has 25 g of protein. Egg -- 1 egg has 6 g of protein. Fish, filet (cooked) -- 1 oz (28 g) has 6-7 g of protein. Lamb (cooked) -- 3 oz (85 g) has 24 g of protein. Pork tenderloin (cooked) -- 3 oz (85 g) has 23 g of protein. Tuna (canned in water) -- 3 oz (85 g) has 20 g of protein. Dairy Cottage cheese --  cup (114 g) has 13.4 g of protein. Milk -- 1 cup (237 mL) has 8 g of protein. Cheese (hard) -- 1 oz (28 g) has 7 g of protein. Yogurt, regular -- 6 oz (170 g) has 8 g of protein. Greek yogurt -- 6 oz (200 g) has 18 g protein. Plant protein Garbanzo beans (canned or cooked) --  cup (130 g) has 6-7 g of protein. Kidney beans (canned or  cooked) --  cup (130  g) has 6-7 g of protein. Nuts (peanuts, pistachios, almonds) -- 1 oz (28 g) has 6 g of protein. Peanut butter -- 1 oz (32 g) has 7-8 g of protein. Pumpkin seeds -- 1 oz (28 g) has 8.5 g of protein. Soybeans (roasted) -- 1 oz (28 g) has 8 g of protein. Soybeans (cooked) --  cup (90 g) has 11 g of protein. Soy milk -- 1 cup (250 mL) has 5-10 g of protein. Soy or vegetable patty -- 1 patty has 11 g of protein. Sunflower seeds -- 1 oz (28 g) has 5.5 g of protein. Buckwheat -- 1 oz (33 g) has 4.3 g of protein. Tofu (firm) --  cup (124 g) has 20 g of protein. Tempeh --  cup (83 g) has 16 g of protein. The items listed above may not be a complete list of foods high in protein. Actual amounts of protein may differ depending on processing. Contact a dietitian for more information. What foods are low in protein? Low-protein foods contain 3 grams (g) or less of protein per serving. They include: Fruits Fruit or vegetable juice --  cup (125 mL) has 1 g of protein. Vegetables Beets (raw or cooked) --  cup (68 g) has 1.5 g of protein. Broccoli (raw or cooked) --  cup (44 g) has 2 g of protein. Collard greens (raw or cooked) --  cup (42 g) has 2 g of protein. Green beans (raw or cooked) --  cup (83 g) has 1 g of protein. Green peas (canned) --  cup (80 g) has 3.5 g of protein. Potato (baked with skin) -- 1 medium potato (173 g) has 3 g of protein. Spinach (cooked) --  cup (90 g) has 3 g of protein. Squash (cooked) --  cup (90 g) has 1.5 g of protein. Avocado -- 1 cup (146 g) has 2.7 g of protein. Grains Bran cereal --  cup (30 g) has 2-3 g of protein. Bread -- 1 slice has 2.5 g of protein. Corn (fresh or cooked) --  cup (77 g) has 2 g of protein. Flour tortilla -- One 6-inch (15 cm) tortilla has 2.5 g of protein. Muffins -- 1 small muffin (2 oz or 57 g) has 3 g of protein. Oatmeal (cooked) --  cup (40 g) has 3 g of protein. Rice (cooked) --  cup (79 g) has 2.5-3.5 g of  protein. Dairy Cream cheese -- 1 oz (29 g) has 2 g of protein. Creamer (half-and-half) -- 1 oz (29 mL) has 1 g of protein. Frozen yogurt --  cup (72 g) has 3 g of protein. Sour cream --  cup (75 g) has 2.5 g of protein. The items listed above may not be a complete list of foods low in protein. Actual amounts of protein may differ depending on processing. Contact a dietitian for more information. Summary Protein is a nutrient that your body needs for growth and development, repairing and making cells and tissues, fighting infection, and providing energy. Protein is in both plant and animal foods. Some of these foods have more protein than others. Depending on your overall health, you may need more or less protein in your diet. Talk to your health care provider about how much protein you need. This information is not intended to replace advice given to you by your health care provider. Make sure you discuss any questions you have with your health care provider. Document Revised: 05/13/2020 Document  Reviewed: 05/13/2020 Elsevier Patient Education  2022 [place], Emmaline Life, NP  05/29/2021 8:40 PM    [place]

## 2021-05-30 NOTE — Addendum Note (Signed)
Addended by: Gerald Stabs on: 05/30/2021 08:06 AM   Modules accepted: Orders

## 2021-06-05 DIAGNOSIS — J384 Edema of larynx: Secondary | ICD-10-CM | POA: Diagnosis not present

## 2021-06-05 DIAGNOSIS — R490 Dysphonia: Secondary | ICD-10-CM | POA: Diagnosis not present

## 2021-06-05 DIAGNOSIS — Z87891 Personal history of nicotine dependence: Secondary | ICD-10-CM | POA: Diagnosis not present

## 2021-06-18 ENCOUNTER — Ambulatory Visit (INDEPENDENT_AMBULATORY_CARE_PROVIDER_SITE_OTHER): Payer: Medicare Other

## 2021-06-18 ENCOUNTER — Other Ambulatory Visit: Payer: Self-pay

## 2021-06-18 DIAGNOSIS — R42 Dizziness and giddiness: Secondary | ICD-10-CM | POA: Diagnosis not present

## 2021-06-19 ENCOUNTER — Other Ambulatory Visit (HOSPITAL_COMMUNITY): Payer: Self-pay | Admitting: Cardiovascular Disease

## 2021-06-19 ENCOUNTER — Telehealth: Payer: Self-pay | Admitting: Cardiovascular Disease

## 2021-06-19 NOTE — Telephone Encounter (Signed)
Patient states she is returning a call from today.  

## 2021-06-19 NOTE — Telephone Encounter (Signed)
Prescription refill request for Eliquis received. Indication: afib  Last office visit: 05/29/2021, [place] Scr: 1.25, 03/13/2020 Age: 80 yo  Weight: 56.7 kg   Pt is overdue for blood work. Called pt and LMOM to call back.

## 2021-06-19 NOTE — Telephone Encounter (Signed)
Spoke with pt regarding results of her recent carotid ultrasound. Results explained and all questions answered.  Pt verbalizes understanding.

## 2021-06-20 NOTE — Telephone Encounter (Addendum)
Called pt who stated that she only had 4 tablets of Eliquis left. Pt stated that she has gotten blood work done at her PCP with in the past year. Informed pt that I would send in refill so that she does not run out.   Called Dr. Pennie Banter office which is closed today.   Refill sent.

## 2021-06-25 DIAGNOSIS — Z85828 Personal history of other malignant neoplasm of skin: Secondary | ICD-10-CM | POA: Diagnosis not present

## 2021-06-25 DIAGNOSIS — L821 Other seborrheic keratosis: Secondary | ICD-10-CM | POA: Diagnosis not present

## 2021-06-25 DIAGNOSIS — D1801 Hemangioma of skin and subcutaneous tissue: Secondary | ICD-10-CM | POA: Diagnosis not present

## 2021-06-25 DIAGNOSIS — L820 Inflamed seborrheic keratosis: Secondary | ICD-10-CM | POA: Diagnosis not present

## 2021-06-25 DIAGNOSIS — D225 Melanocytic nevi of trunk: Secondary | ICD-10-CM | POA: Diagnosis not present

## 2021-06-26 NOTE — Telephone Encounter (Signed)
Called pt's PCP, [facility] requesting pt's labs be faxed to [phone].

## 2021-06-27 NOTE — Telephone Encounter (Signed)
Scr: 1.07, 04/28/2021

## 2021-07-03 ENCOUNTER — Other Ambulatory Visit (HOSPITAL_COMMUNITY): Payer: Self-pay | Admitting: Cardiovascular Disease

## 2021-07-14 DIAGNOSIS — E559 Vitamin D deficiency, unspecified: Secondary | ICD-10-CM | POA: Diagnosis not present

## 2021-07-14 DIAGNOSIS — M810 Age-related osteoporosis without current pathological fracture: Secondary | ICD-10-CM | POA: Diagnosis not present

## 2021-08-26 ENCOUNTER — Encounter: Payer: Self-pay | Admitting: Cardiovascular Disease

## 2021-08-26 ENCOUNTER — Other Ambulatory Visit: Payer: Self-pay

## 2021-08-26 ENCOUNTER — Ambulatory Visit: Payer: Medicare Other | Admitting: Cardiovascular Disease

## 2021-08-26 DIAGNOSIS — I10 Essential (primary) hypertension: Secondary | ICD-10-CM | POA: Diagnosis not present

## 2021-08-26 DIAGNOSIS — I519 Heart disease, unspecified: Secondary | ICD-10-CM

## 2021-08-26 DIAGNOSIS — I712 Thoracic aortic aneurysm, without rupture, unspecified: Secondary | ICD-10-CM | POA: Insufficient documentation

## 2021-08-26 DIAGNOSIS — Z952 Presence of prosthetic heart valve: Secondary | ICD-10-CM

## 2021-08-26 DIAGNOSIS — E782 Mixed hyperlipidemia: Secondary | ICD-10-CM

## 2021-08-26 DIAGNOSIS — I480 Paroxysmal atrial fibrillation: Secondary | ICD-10-CM

## 2021-08-26 DIAGNOSIS — I7121 Aneurysm of the ascending aorta, without rupture: Secondary | ICD-10-CM

## 2021-08-26 NOTE — Assessment & Plan Note (Signed)
History of severe aortic insufficiency status post aortic valve replacement by Dr. [name] 01/31/2019 with a 21 mm Ignacia Marvel bioprosthetic aortic valve.  Her most recent 2D echo performed/6/22 revealed a well-functioning aortic bioprosthesis without any evidence of AI or AS.  We will repeat this on an annual basis. ?

## 2021-08-26 NOTE — Assessment & Plan Note (Signed)
History of PAF maintaining sinus rhythm on Eliquis oral anticoagulation. 

## 2021-08-26 NOTE — Assessment & Plan Note (Signed)
History of left ventricular dysfunction in the past with ejection fractions in the 40 to 45% by echo 10/04/2018.  Her most recent 2D echo performed/6/22 revealed normalization of her EF to 60 to 65%. ?

## 2021-08-26 NOTE — Progress Notes (Signed)
08/26/2021 Dana Pruitt   05/29/1941  [id number]  Primary Physician Deland Pretty, MD Primary Cardiologist: Lorretta Harp MD [name], [place]  HPI:  Dana Pruitt is a 81 y.o.   thin-appearing married Caucasian female mother of 2, grandmother of 2 grandchildren whose mother Dana Pruitt was also a patient of mine and died at the age of 32. She was referred by Dr. Deland Pretty for cardiovascular evaluation because of echocardiogram performed 02/16/14 that showed moderate left ventricular dysfunction with an ejection fraction of 35-40% and moderate aortic insufficiency.  I last saw her in the office 07/05/2020.  Her cardiovascular risk factor profile is remarkable for remote tobacco abuse having quit in 1990 and treated hyperlipidemia. There is no family history of heart disease. She has never had a heart attack or stroke and denies chest pain or shortness of breath. She did have a Hollenhorst plaque documented by ophthalmologic exam several months ago. Carotid Dopplers performed since that time were normal. The plaque is since resolved. A 2-D echo was performed that showed an EF of 35-40% with moderate aortic insufficiency. LV size is normal and there was no LVH. She did have breast cancer documented by lumpectomy in 2013 and had chemotherapy and radiation therapy. Her oncologist was Dr. Letta Pate . She is on Femara . I began her on low-dose carvedilol and repeated her 2-D echocardiogram on 08/10/14 revealed improvement in her LV function up to 45-50%. She continues to have moderate to severe AI with normal LV size and function.. She only complains of mild dyspnea on exertion. Since I saw her year ago she should make remained clinically stable. She denies chest pain or shortness of breath   2D echo performed 10/04/2018 revealed EF of 40 to 45% with normal LV size with severe aortic insufficiency.  She continues to deny symptoms of shortness of breath.   She was admitted in July with  chest pain and shortness of breath and underwent right left heart cath by Dr. Aundra Dubin revealing minimal CAD with severe AI.  She ultimately underwent bioprosthetic AVR by Dr. [name] and a follow-up admission 01/31/2019 with a 21 mm Inspira's Edwards bioprosthetic valve.  She was hospitalized for 10 days.  She continues to recuperate.  She denies chest pain or shortness of breath.  She remains on Coumadin anticoagulation because of preop A. fib which converted prior to discharge on amiodarone which has since been discontinued.  She is participating cardiac rehab.  She was on 1 L of O2 with exercise and at night which she was not on prior to admission I think can probably be discontinued.   She  was seen in the emergency room for PAF which converted spontaneously several hours later.  She had a recurrent episode after that.  She saw Roderic Palau in the A. fib clinic 03/13/2020.  She was started on Eliquis oral anticoagulation and her carvedilol was down titrated because of dizziness.  Recent 2D echo performed 10/11/2019 revealed normal LV systolic function with a well-functioning aortic bioprosthesis.  Since I saw her a year ago she continues to do well.  She has had had a couple of dizzy episodes while in the grocery store which has been attributed to dehydration.  She has had no recurrent episodes of A-fib.  She denies chest pain or shortness of breath.   Current Meds  Medication Sig   albuterol (PROAIR HFA) 108 (90 Base) MCG/ACT inhaler 2 puffs every 4 hours as needed only  if your can't catch your breath   apixaban (ELIQUIS) 5 MG TABS tablet TAKE 1 TABLET(5 MG) BY MOUTH TWICE DAILY   atorvastatin (LIPITOR) 20 MG tablet Take 20 mg by mouth daily.    carvedilol (COREG) 6.25 MG tablet TAKE 1 AND 1/2 TABLETS(9.375 MG) BY MOUTH TWICE DAILY WITH A MEAL   denosumab (PROLIA) 60 MG/ML SOSY injection Inject 60 mg into the skin every 6 (six) months.   diltiazem (CARDIZEM) 30 MG tablet Take 1 tablet every 4 hours  AS NEEDED for AFIB heart rate >100   fluticasone (FLONASE) 50 MCG/ACT nasal spray Place 2 sprays into both nostrils daily.   Fluticasone-Umeclidin-Vilant (TRELEGY ELLIPTA) 100-62.5-25 MCG/INH AEPB One click eah am   guaiFENesin (MUCINEX) 600 MG 12 hr tablet Take 1 tablet (600 mg total) by mouth 2 (two) times daily.   sertraline (ZOLOFT) 100 MG tablet Taking by mouth '100mg'$  in the am and '50mg'$  in the pm   [DISCONTINUED] cholecalciferol (VITAMIN D3) 25 MCG (1000 UT) tablet Take 1,000 Units by mouth daily.   [DISCONTINUED] vitamin B-12 (CYANOCOBALAMIN) 500 MCG tablet Take 500 mcg by mouth daily.     No Known Allergies  Social History   Socioeconomic History   Marital status: Married    Spouse name: Not on file   Number of children: 2   Years of education: college   Highest education level: Not on file  Occupational History    Employer: RETIRED  Tobacco Use   Smoking status: Former    Packs/day: 3.00    Years: 30.00    Pack years: 90.00    Types: Cigarettes    Start date: 05/19/1958    Quit date: 06/30/1990    Years since quitting: 31.1   Smokeless tobacco: Never   Tobacco comments:    Stopped smoking for 2-3 years during second child.   Vaping Use   Vaping Use: Never used  Substance and Sexual Activity   Alcohol use: Yes    Comment: 1-2 glasses wine/month   Drug use: No   Sexual activity: Yes    Birth control/protection: Post-menopausal    Comment: Menarche age 60, G2,P2, Menopause in 38"s, No HRT Married  Other Topics Concern   Not on file  Social History Narrative   [facility] (12/29/16):   Originally from [place], [place]. Moved to [place] at 81 y.o. Does have a dog. Hasn't worked outside the home. Remote parakeet exposure from her children. No mold or hot tub exposure. Enjoys doing yard work & walking.    Social Determinants of Health   Financial Resource Strain: Not on file  Food Insecurity: Not on file  Transportation Needs: Not on file  Physical Activity: Not on  file  Stress: Not on file  Social Connections: Not on file  Intimate Partner Violence: Not on file     Review of Systems: General: negative for chills, fever, night sweats or weight changes.  Cardiovascular: negative for chest pain, dyspnea on exertion, edema, orthopnea, palpitations, paroxysmal nocturnal dyspnea or shortness of breath Dermatological: negative for rash Respiratory: negative for cough or wheezing Urologic: negative for hematuria Abdominal: negative for nausea, vomiting, diarrhea, bright red blood per rectum, melena, or hematemesis Neurologic: negative for visual changes, syncope, or dizziness All other systems reviewed and are otherwise negative except as noted above.    Blood pressure 124/82, pulse 78, height '5\' 4"'$  (1.626 m), weight 124 lb (56.2 kg).  General appearance: alert and no distress Neck: no adenopathy, no carotid bruit, no JVD,  supple, symmetrical, trachea midline, and thyroid not enlarged, symmetric, no tenderness/mass/nodules Lungs: clear to auscultation bilaterally Heart: regular rate and rhythm, S1, S2 normal, no murmur, click, rub or gallop Extremities: extremities normal, atraumatic, no cyanosis or edema Pulses: 2+ and symmetric Skin: Skin color, texture, turgor normal. No rashes or lesions Neurologic: Grossly normal  EKG not performed today  ASSESSMENT AND PLAN:   HLD (hyperlipidemia) History of hyperlipidemia on statin therapy with lipid profile performed 01/24/2021 revealing total cholesterol 153, LDL 71 HDL 48.  Left ventricular dysfunction History of left ventricular dysfunction in the past with ejection fractions in the 40 to 45% by echo 10/04/2018.  Her most recent 2D echo performed/6/22 revealed normalization of her EF to 60 to 65%.  Essential hypertension History of essential hypertension blood pressure measured today at 124/82.  She is on carvedilol.  S/P AVR (aortic valve replacement) History of severe aortic insufficiency status  post aortic valve replacement by Dr. [name] 01/31/2019 with a 21 mm Ignacia Marvel bioprosthetic aortic valve.  Her most recent 2D echo performed/6/22 revealed a well-functioning aortic bioprosthesis without any evidence of AI or AS.  We will repeat this on an annual basis.  PAF (paroxysmal atrial fibrillation) (HCC) History of PAF maintaining sinus rhythm on Eliquis oral anticoagulation.  Thoracic aortic aneurysm Ascending thoracic aorta measured 42 mm by echo performed/6/22.  We will continue to follow this on annual basis.     Lorretta Harp MD FACP,FACC,FAHA, [facility] 08/26/2021 11:46 AM

## 2021-08-26 NOTE — Patient Instructions (Signed)
Medication Instructions:  ?Your physician recommends that you continue on your current medications as directed. Please refer to the Current Medication list given to you today. ? ?*If you need a refill on your cardiac medications before your next appointment, please call your pharmacy* ? ? ?Testing/Procedures: ?Your physician has requested that you have an echocardiogram. Echocardiography is a painless test that uses sound waves to create images of your heart. It provides your doctor with information about the size and shape of your heart and how well your heart?s chambers and valves are working. This procedure takes approximately one hour. There are no restrictions for this procedure. To be done in April. This procedure will be done at [address] ? ? ? ?Follow-Up: ?At [facility], you and your health needs are our priority.  As part of our continuing mission to provide you with exceptional heart care, we have created designated Provider Care Teams.  These Care Teams include your primary Cardiologist (physician) and Advanced Practice Providers (APPs -  Physician Assistants and Nurse Practitioners) who all work together to provide you with the care you need, when you need it. ? ?We recommend signing up for the patient portal called "MyChart".  Sign up information is provided on this After Visit Summary.  MyChart is used to connect with patients for Virtual Visits (Telemedicine).  Patients are able to view lab/test results, encounter notes, upcoming appointments, etc.  Non-urgent messages can be sent to your provider as well.   ?To learn more about what you can do with MyChart, go to [email or web address].   ? ?Your next appointment:   ?6 month(s) ? ?The format for your next appointment:   ?In Person ? ?Provider:   ?Coletta Memos, FNP, Fabian Sharp, PA-C, Sande Rives, PA-C, Caron Presume, PA-C, Jory Sims, DNP, ANP, or Almyra Deforest, PA-C    ? ? ?Then, Quay Burow, MD will plan to see  you again in 12 month(s). ?

## 2021-08-26 NOTE — Assessment & Plan Note (Signed)
History of essential hypertension blood pressure measured today at 124/82.  She is on carvedilol. 

## 2021-08-26 NOTE — Assessment & Plan Note (Signed)
History of hyperlipidemia on statin therapy with lipid profile performed 01/24/2021 revealing total cholesterol 153, LDL 71 HDL 48. ?

## 2021-08-26 NOTE — Assessment & Plan Note (Signed)
Ascending thoracic aorta measured 42 mm by echo performed/6/22.  We will continue to follow this on annual basis. ?

## 2021-09-04 ENCOUNTER — Ambulatory Visit: Payer: Medicare Other | Admitting: Internal Medicine

## 2021-09-15 ENCOUNTER — Encounter: Payer: Self-pay | Admitting: Internal Medicine

## 2021-09-15 ENCOUNTER — Other Ambulatory Visit: Payer: Self-pay

## 2021-09-15 ENCOUNTER — Ambulatory Visit: Payer: Medicare Other | Admitting: Internal Medicine

## 2021-09-15 DIAGNOSIS — J449 Chronic obstructive pulmonary disease, unspecified: Secondary | ICD-10-CM

## 2021-09-15 MED ORDER — BREZTRI AEROSPHERE 160-9-4.8 MCG/ACT IN AERO: 2.0000 | INHALATION_SPRAY | Freq: Two times a day (BID) | RESPIRATORY_TRACT | 0 refills | Status: DC

## 2021-09-15 MED ORDER — BREZTRI AEROSPHERE 160-9-4.8 MCG/ACT IN AERO: 2.0000 | INHALATION_SPRAY | Freq: Two times a day (BID) | RESPIRATORY_TRACT | 11 refills | Status: DC

## 2021-09-15 NOTE — Patient Instructions (Signed)
Plan A = Automatic = Always=    breztri Take 2 puffs first thing in am and then another 2 puffs about 12 hours later.  ?  ?Work on inhaler technique:  relax and gently blow all the way out then take a nice smooth full deep breath back in, triggering the inhaler at same time you start breathing in.  Hold for up to 5 seconds if you can. Blow out thru nose. Rinse and gargle with water when done.  If mouth or throat bother you at all,  try brushing teeth/gums/tongue with arm and hammer toothpaste/ make a slurry and gargle and spit out.  ? ?   ? ?Plan B = Backup (to supplement plan A, not to replace it) ?Only use your albuterol inhaler as a rescue medication to be used if you can't catch your breath by resting or doing a relaxed purse lip breathing pattern.  ?- The less you use it, the better it will work when you need it. ?- Ok to use the inhaler up to 2 puffs  every 4 hours if you must but call for appointment if use goes up over your usual need ?- Don't leave home without it !!  (think of it like the spare tire for your car)  ? ?Plan C = Crisis (instead of Plan B but only if Plan B stops working) ?- only use your albuterol nebulizer if you first try Plan B and it fails to help > ok to use the nebulizer up to every 4 hours but if start needing it regularly call for immediate appointment ? ? ?Plan D = Doctor ?- call me if B and C not adequate ? ?Please schedule a follow up visit in 12 months but call sooner if needed  ?

## 2021-09-15 NOTE — Progress Notes (Signed)
? ?MARTICIA REIFSCHNEIDER, female    DOB: 1940/07/06     MRN: [id number] ? ? ?Brief patient profile:   81  yowf MM/quit smoking 1992 previously followed by Dr Ashok Cordia with  GOLD II copd  ? ? ?PFT ?01/04/17: FVC 2.17 L (79%) FEV1 1.32 L (64%) FEV1/FVC 0.61 FEF 25-75 0.57 L (34%) negative bronchodilator response TLC 5.23 L (105%) RV 136% ERV 39% DLCO corrected 43% ?12/30/12: FVC 2.77 L (101%) FEV1 1.92 L (89%) FEV1/FVC 0.69 FEF 25-75 1.13 L (51%) negative bronchodilator response TLC 4.77 L (97%) RV 110% DLCO uncorrected 51% ?  ?6MWT ?03/08/17:  Walked 414 meters / Baseline Sat 96% on RA / Nadir Sat 94% on RA (stopped with 4:37 due to poor pulse ox signal) ?  ?  ?CARDIAC ?TTE (07/14/16):  LV normal in size with EF 60-65% & no regional wall motion abnormalities. Grade 1 diastolic dysfunction. LA & RA normal in size. RV mildly dilated with reduced systolic function. Moderate aortic regurgitation without stenosis. Ascending aorta is mildly dilated with aortic root normal in size. No mitral stenosis or regurgitation. Mild pulmonic regurgitation. No significant tricuspid regurgitation. No pericardial effusion. ? ? ? ?Admit date: 01/31/2019 ?Discharge date: 02/10/2019 ?  ?Admission Diagnoses: Severe aortic insufficiency ?  ?Discharge Diagnoses:  ?Active Problems: ?  Aortic valve disease ?   ? hx The patient is a 81 year old female referred to Dr. [name] right for evaluation of severe aortic insufficiency.  She has been seen by her cardiologist for several years with serial echocardiogram.  Her most recent echo revealed severe AI with some LV dilatation and reduced LV systolic function.  She underwent right and left heart catheterization and was found to have no significant coronary artery disease.  Right heart catheterization showed normal pressures with mixed venous saturations of 68%.  LVEDP was normal and cardiac index was 2.9.  Pulmonary function studies were performed due to a history of smoking heavy smoking in the past,  although really remote.  Her FEV1 was 1.2 and DLCO was 40%.  A CT scan showed the ascending aorta measuring 4.0 cm.  There was findings of panlobular emphysema noted on the CT scan.  Dr. [name] evaluated the patient and all her studies and recommended proceeding with elective aortic valve replacement and she was admitted this hospitalization for the procedure. ?  ?  ?Hospital Course: The patient was admitted electively and on 01/31/2019 taken to the operating room where she underwent Aortic valve replacement with a 21 mm Inspiris Edwards valve..  She tolerated it well and was taken to the surgical intensive care unit in stable condition. ?  ?Postoperative hospital course: ?  ?The patient has done well postoperatively.  She has remained neurologically intact.  She was weaned from the ventilator using standard protocols without difficulty.  She initially required some inotropic support with dobutamine but this was able to be weaned without difficulty.  She was also started on Midodrine to assist with blood pressure.  She is not felt to be a candidate at this time for ACE inhibitor or ARB.  She does have some postoperative volume overload and is responding to diuretics.  She has had postoperative atrial fibrillation and has been started on an amiodarone drip per usual protocol.  She was transitioned to p.o. amiodarone and subsequently was chemically cardioverted to sinus rhythm with only occasional runs of afib. Metoprolol was uptitrated and additionally she was started on Coumadin.  At the time of discharge the patient was very stable.  Home health arrangements were also made to have INR checked.  Additionally she did require home oxygen to be arranged with lower sats with ambulation in the qualifying range.  She has known severe COPD. ?  ?  ? ? ? ?History of Present Illness  ?04/19/2019  Pulmonary/ 1st office eval/Magon Croson  ?Chief Complaint  ?Patient presents with  ? New Patient (Initial Visit)  ?  Seen in the past by  Dr Ashok Cordia. She states she has been doing cardiac rehab at [name] and having issues with DOE and low o2 sats.   ?Dyspnea:  Was walking in hallways prior to d/c with sats 87% so rec 2lpm sleeping and walking but did not  Use it that way and in rehab same problem so started on 2lpm but has been  weaned to 1pm  ?At home does dog walking up and down hills,  Finds talking and walking are tough/ has not noticed hoarseness  ?Cough: no ?Sleep: flat  ?SABA use: none  - has not helped. ?rec ?Try off lisinopril for now  ?Start Micardis 80 mg one daily x 4 weeks  ?Make sure you check your oxygen saturations at highest level of activity to be sure it stays over 90% and adjust upward to maintain this level if needed but remember to turn it back to previous settings when you stop (to conserve your supply).  ? ?  ? ? ? ? ?05/31/2019  f/u ov/Makyi Ledo re: GOLD II/ ex hypoxemia  ?Chief Complaint  ?Patient presents with  ? Follow-up  ?  Here to walk with POC to see how she tolerates.    ?Dyspnea:  Goal is to be able to do gxt x 2.3 mph x 30 min but uses 2lpm do to this at home does not check sats on or off 02 and uses a large tank for this purpose  ?Cough: minimal/ non prod ?Sleeping: bed flat, on side  ?SABA use: none  ?02: just with exertion ?rec ?Stiolto is 2 pffs each am x 15 days on a trial basis - go ahead and fill the prescription if you like it  ?Work on inhaler technique:  ?Make sure you check your oxygen saturations at highest level of activity to be sure it stays over 90% and adjust upward to maintain this level if needed but remember to turn it back to previous settings when you stop (to conserve your supply).  ? ? ?08/29/2019  f/u ov/Novelle Addair re:  GOLD II/ ex hypoxemia / both covid shots last rx 3 weeks prior to OV   ?Chief Complaint  ?Patient presents with  ? Follow-up  ?  Breathing is much improved since the last visit. She does not have a rescue inhaler and would like to discuss getting one.   ?Dyspnea:  Does gxt  22mh x 30 min no  longer any 02 / no elevation  ?Cough: minimal dry ?Sleeping: flat/ one pillow  ?SABA use: n/a ?02: turned it back in  ?rec ?Plan A = Automatic = Always=    Stiolto 2 pffs each am  ?Plan B = Backup (to supplement plan A, not to replace it) ?Only use your albuterol inhaler as a rescue medication ? ? ? ?09/04/2020  f/u ov/Kevin Space re: GOLD II copd / trelegy 200 ? making her hoarse (changed at rec of pcp during ? Flare "I though I had pna"  - never used saba during "flare"  ?Chief Complaint  ?Patient presents with  ? Follow-up  ?  Had chest  tightness approx 1 month ago- PCP put her on Trelegy 200 and this has helped.   ? Dyspnea:  Treadmill x 30 min 3 x per week, not elevation /not checking sats  ?Cough: none/ some hoarseness on trelegy 200 ?Sleeping: flat fine/ one pillow  ?SABA use: doesn't have one  ?02: none  ?Covid status:   vax x 3  ?Rec ?Plan A = Automatic = Always=    trelegy 056 one click each am  ?Brush teeth with arm and hammer tootpaste and gargle with the slurry  ?Plan B = Backup (to supplement plan A, not to replace it) ?Only use your albuterol inhaler as a rescue medication ?Please schedule a follow up visit in 12  months but call sooner if needed   ? ? ?09/15/2021  f/u ov/Angelyne Terwilliger re: GOLD 2   maint on Trelegy 100 daily   ?Chief Complaint  ?Patient presents with  ? Follow-up  ?  Pt states she has been doing okay since last visit and denies any complaints.  ?Dyspnea:  silver sneakers  3 x weekly, no more treadmill / not checking sats  ?Cough: minimal dry assoc  raspy voice ? Worse on dpi  ?Sleeping: flat bed/ one pillow  ?SABA use: none ?02: none - no recent checks, no longer has 02 at all  ?Covid status:   vax x all of them  ? ? ?No obvious day to day or daytime variability or assoc excess/ purulent sputum or mucus plugs or hemoptysis or cp or chest tightness, subjective wheeze or overt sinus or hb symptoms.  ? ?Sleeping  without nocturnal  or early am exacerbation  of respiratory  c/o's or need for noct saba.  Also denies any obvious fluctuation of symptoms with weather or environmental changes or other aggravating or alleviating factors except as outlined above  ? ?No unusual exposure hx or h/o childhood pna/ asthma o

## 2021-09-16 ENCOUNTER — Encounter: Payer: Self-pay | Admitting: Internal Medicine

## 2021-09-16 NOTE — Assessment & Plan Note (Signed)
Quit smoking 1992  ?01/04/17: FVC 2.17 L (79%) FEV1 1.32 L (64%) FEV1/FVC 0.61 FEF 25-75 0.57 L (34%) negative bronchodilator response TLC 5.23 L (105%) RV 136% ERV 39% DLCO corrected 43% ?- alpha one AT  03/08/17   MM  Level 164  ?- PFT's  01/05/2019  FEV1 1.30 (64 % ) ratio 0.62  p 6 % improvement from saba p ? prior to study with DLCO  7.27 (39%) corrects to 2.15 (52%)  for alv volume and FV curve classic concave pattern ?- 05/31/2019  After extensive coaching inhaler device,  effectiveness =    75% (delayed insp) with SMI > try stiolto 2 puffs each am > improved ?- 09/04/2020 changed to trelegy200 during  Flare > rec trelegy 100 maint  ?- 09/15/2021  After extensive coaching inhaler device,  effectiveness =    80%  ? ? Group D in terms of symptom/risk and laba/lama/ICS  therefore appropriate rx at this point >>>  Try breztri to see if less irritation of the upper airway plus approp saba/ reviewed in ABCD action plan see avs for instructions unique to this ov ? ? ?    ?  ? ?Each maintenance medication was reviewed in detail including emphasizing most importantly the difference between maintenance and prns and under what circumstances the prns are to be triggered using an action plan format where appropriate. ? ?Total time for H and P, chart review, counseling, reviewing hfa/neb device(s) and generating customized AVS unique to this office visit / same day charting =21 min ?     ? ?  ?

## 2021-09-23 ENCOUNTER — Other Ambulatory Visit (HOSPITAL_COMMUNITY): Payer: Self-pay | Admitting: Cardiovascular Disease

## 2021-09-23 DIAGNOSIS — I480 Paroxysmal atrial fibrillation: Secondary | ICD-10-CM

## 2021-09-23 NOTE — Telephone Encounter (Signed)
Prescription refill request for Eliquis received. ?Indication: Afib  ?Last office visit:08/26/21 [name])  ?Scr: 1.07 (04/28/21)  ?Age: 81 ?Weight: 57.2kg ? ?Appropriate dose and refill sent to requested pharmacy. (In November, 2023 pt may need dose decreased due to age and weight)  ? ? ?

## 2021-09-26 ENCOUNTER — Other Ambulatory Visit (HOSPITAL_COMMUNITY): Payer: Medicare Other

## 2021-10-03 ENCOUNTER — Telehealth: Payer: Self-pay | Admitting: Internal Medicine

## 2021-10-03 MED ORDER — TRELEGY ELLIPTA 100-62.5-25 MCG/ACT IN AEPB: 1.0000 | INHALATION_SPRAY | Freq: Every day | RESPIRATORY_TRACT | 11 refills | Status: DC

## 2021-10-03 NOTE — Telephone Encounter (Signed)
Fine to use trelegy 552  one click each am  ? ?Refill x one year  ?

## 2021-10-03 NOTE — Telephone Encounter (Signed)
ATC patient, left message letting her know that RX his being sent to preferred pharmacy for Trelegy. Advised her to call back with any questions or concerns. Nothing further needed at this time, ? ?

## 2021-10-03 NOTE — Telephone Encounter (Signed)
Spoke with the pt  ?She states that she is needing refill on Trelegy  ?Last ov states that she should be using Breztri  ?She states Dr Melvyn Novas told her she could use either one  ?Please advise thanks ?

## 2021-10-06 DIAGNOSIS — E559 Vitamin D deficiency, unspecified: Secondary | ICD-10-CM | POA: Diagnosis not present

## 2021-10-06 DIAGNOSIS — Z6821 Body mass index (BMI) 21.0-21.9, adult: Secondary | ICD-10-CM | POA: Diagnosis not present

## 2021-10-06 DIAGNOSIS — M810 Age-related osteoporosis without current pathological fracture: Secondary | ICD-10-CM | POA: Diagnosis not present

## 2021-10-07 ENCOUNTER — Encounter: Payer: Self-pay | Admitting: Oncology

## 2021-10-07 ENCOUNTER — Ambulatory Visit (HOSPITAL_COMMUNITY): Payer: Medicare Other | Attending: Cardiology

## 2021-10-07 DIAGNOSIS — I480 Paroxysmal atrial fibrillation: Secondary | ICD-10-CM

## 2021-10-07 DIAGNOSIS — I10 Essential (primary) hypertension: Secondary | ICD-10-CM | POA: Diagnosis not present

## 2021-10-07 DIAGNOSIS — Z952 Presence of prosthetic heart valve: Secondary | ICD-10-CM | POA: Diagnosis not present

## 2021-10-07 DIAGNOSIS — I7121 Aneurysm of the ascending aorta, without rupture: Secondary | ICD-10-CM

## 2021-10-07 DIAGNOSIS — I519 Heart disease, unspecified: Secondary | ICD-10-CM

## 2021-10-07 DIAGNOSIS — E782 Mixed hyperlipidemia: Secondary | ICD-10-CM

## 2021-10-07 LAB — ECHOCARDIOGRAM COMPLETE
AR max vel: 0.8 cm2
AV Area VTI: 0.88 cm2
AV Area mean vel: 0.81 cm2
AV Mean grad: 14 mmHg
AV Peak grad: 25.4 mmHg
Ao pk vel: 2.52 m/s
Area-P 1/2: 2.58 cm2
S' Lateral: 2.6 cm

## 2021-10-14 DIAGNOSIS — Z1231 Encounter for screening mammogram for malignant neoplasm of breast: Secondary | ICD-10-CM | POA: Diagnosis not present

## 2021-10-16 DIAGNOSIS — M810 Age-related osteoporosis without current pathological fracture: Secondary | ICD-10-CM | POA: Diagnosis not present

## 2021-11-18 DIAGNOSIS — H2513 Age-related nuclear cataract, bilateral: Secondary | ICD-10-CM | POA: Diagnosis not present

## 2021-11-18 DIAGNOSIS — H40013 Open angle with borderline findings, low risk, bilateral: Secondary | ICD-10-CM | POA: Diagnosis not present

## 2021-11-25 DIAGNOSIS — R739 Hyperglycemia, unspecified: Secondary | ICD-10-CM | POA: Diagnosis not present

## 2021-11-25 DIAGNOSIS — R42 Dizziness and giddiness: Secondary | ICD-10-CM | POA: Diagnosis not present

## 2021-11-25 DIAGNOSIS — R0602 Shortness of breath: Secondary | ICD-10-CM | POA: Diagnosis not present

## 2021-11-25 DIAGNOSIS — R55 Syncope and collapse: Secondary | ICD-10-CM | POA: Diagnosis not present

## 2021-12-04 NOTE — Progress Notes (Signed)
Cardiology Office Note:    Date:  12/11/2021   ID:  Dana Pruitt, DOB 1941/03/22, MRN [id number]  PCP:  Dana Pretty, MD  Cardiologist:  Quay Burow, MD  Electrophysiologist:  None   Referring MD: Dana Pretty, MD   Chief Complaint: pre-syncope  History of Present Illness:    Dana Pruitt is a 81 y.o. female with a history of  mild non-obstructive CAD on cardiac cath in 12/2018, severe aortic insufficiency s/p AVR in 88/8916, chronic systolic CHF/non-ischemic cardiomyopathy with normalization of EF, paroxysmal atrial fibrillation on Eliquis, mild mitral regurgitation, COPD, hypertension, hyperlipidemia, GERD, anxiety/depression, and breast cancer s/p chemotherapy/radiation and lumpectomy in 2013  who is followed by Dr. Gwenlyn Pruitt for evaluation of shortness of breath and dizziness.  Patient was referred to Dr. Gwenlyn Pruitt in 2015 after Echo showed LVEF of 35-40% with moderate aortic insuffiencey. She was started on beta-blocker for her cardiomyopathy and EF eventually normalized to to 60-65% on repeat Echo in 06/2016. Aortic disease progressed and Echo in 09/2018 showed LVEF of 40-45 with diffuse hypokinesis, grade 1 diastolic dysfunction, and severe AI.She was admitted in 12/2018 with chest pain and shortness of breath. She underwent R/LHC at that time which showed mild non-obstructive CAD, wide aortic pulse pressure suggestive of significant AI, preserved cardiac output, and low filling pressures. Patient ultimately underwent AVR with 17m Inspiris Edwards valve by Dr. VPrescott Gumon 01/31/2019. She did develop post-op atrial fibrillation but converted back to sinus rhythm after initiation of Amiodarone. She was discharge on Amiodarone and Coumadin both of which were able to be discontinued later. However, she did have recurrent episodes of atrial fibrillation. Most recent Echo in 09/2019 LVEF of 55-60% with well functioning AVR. [facility]  and was started on Eliquis. Her Coreg was decreased at that time due to dizziness. Myoview and Event Monitor were ordered in 07/2020 for further evaluation of left shoulder pain with associated nausea and near syncope as well as several episodes of dizziness. Myoview was low risk with no evidence of ischemia. Dizziness actually improved after starting a new inhaler so patient requested holding off on Event Monitor for now. Routine Echo in 09/2020 for monitoring of AI showed LVEF of 60-65% with grade 1 diastolic dysfunction, and well functioning aortic bioprosthesis. Also showed mild MR and mild dilatation of the ascending aorta measuring 42 mm.  Patient was last seen by Dr. BGwenlyn Pruitt 08/2021 at which time she reported a couple of episodes of dizziness which she attributed to dehydration but was otherwise doing well from a cardiac standpoint. Routine Echo was ordered at that time and showed LVEF of 55-60% with normal wall motion, mild LVH of the basal-septal segment, and grade 1 diastolic dysfunction as well as stable AVR with mean gradient of 14 mmHg and trace AI, mild MR, and mild dilatation of the ascending aorta measuring 42 mm.  Patient presents today for follow-up. Here alone. Patient reports dyspnea on exertion as well as increased dizziness over the past month. She reported dyspnea with activities such as walking up the stairs and dizziness both at rest and with activity. She states that she has had to pull over while driving because of the dizziness and describes some near syncope when she had dizziness while walking in the grocery store. This seems to be correlated to her increasing her Sertraline dose to 1033mtwice daily. She made this dose adjustment on her own due to increased stress.  She has since spoken with Dr. Shelia Media and dose was decreased back down to 75m twice daily. Both her dyspnea and dizziness has significant improved after going back down to decreased dose of Sertraline. She is not really  having any dyspnea any more and states she is now only having some mild lightheadedness rather than dizziness. She denies any palpitations or syncope. She also reports one episode of left arm pain that started around her elbow and radiated up to her shoulder and then back down about 1 week ago. This occurred at rest. She denies any chest pain. This was an isolated event but she is very concerned about this.  Past Medical History:  Diagnosis Date   Anemia    "several times over the years" (10/17/2012)   Anxiety    Breast cancer ([facility] 02/2010   right breast   Carcinoma of breast treated with adjuvant hormone therapy ([facility] 9/11   [facility]   CHF (congestive heart failure) (HCC)    COPD (chronic obstructive pulmonary disease) (HCC)    Depression    Exertional shortness of breath    "recently" (10/17/2012)   GERD (gastroesophageal reflux disease)    diet controlled, no med   Hollenhorst plaque, left eye    apparently resolved at recent ophthalmology visit   Hyperlipidemia    Hypertension    IDC, RightStage II, 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 SN:: [id number]  Surgeon: VPrescott Gum PCollier Salina MD;  Location: [facility]  Service: Open Heart Surgery;  Laterality: N/A;   APPENDECTOMY  1974   BREAST BIOPSY Right 02/27/10   Needle core Biopsy; Invasive Mammary; ER/PR Positive, Her-2 Neu negative, Ki-67 22%   BREAST LUMPECTOMY WITH SENTINEL LYMPH NODE BIOPSY Right 07/03/11   Invasive Ductal Carcinoma;0/3 nodes negative,; ER,PR Positive, Her-2 Neg; Ki-67 22%   CESAREAN SECTION  1970; 1974   COLONOSCOPY     DILATION AND CURETTAGE OF UTERUS  1970's   RIGHT/LEFT HEART CATH AND  CORONARY ANGIOGRAPHY N/A 01/02/2019   Procedure: RIGHT/LEFT HEART CATH AND CORONARY ANGIOGRAPHY;  Surgeon: MLarey Dresser MD;  [facility];  Service: Cardiovascular;  Laterality: N/A;   TEE WITHOUT CARDIOVERSION N/A 01/31/2019   Procedure: TRANSESOPHAGEAL ECHOCARDIOGRAM (TEE);  Surgeon: VPrescott Gum PCollier Salina MD;  Location: [facility]  Service: Open Heart Surgery;  Laterality: N/A;   TUBAL LIGATION  1974   WISDOM TOOTH EXTRACTION      Current Medications: Current Meds  Medication Sig   albuterol (PROAIR HFA) 108 (90 Base) MCG/ACT inhaler 2 puffs every 4 hours as needed only  if your can't catch your breath   apixaban (ELIQUIS) 5 MG TABS tablet TAKE 1 TABLET(5 MG) BY MOUTH TWICE DAILY   atorvastatin (LIPITOR) 20 MG tablet Take 20 mg by mouth daily.    Budeson-Glycopyrrol-Formoterol (BREZTRI AEROSPHERE) 160-9-4.8 MCG/ACT AERO Inhale 2 puffs into the lungs 2 (two) times daily.   carvedilol (COREG) 6.25 MG tablet TAKE 1 AND 1/2 TABLETS(9.375 MG) BY MOUTH TWICE DAILY WITH A MEAL   denosumab (PROLIA) 60 MG/ML SOSY injection Inject 60 mg into the skin every 6 (six) months.   diltiazem (CARDIZEM) 30 MG tablet Take 1  tablet every 4 hours AS NEEDED for AFIB heart rate >100   fluticasone (FLONASE) 50 MCG/ACT nasal spray Place 2 sprays into both nostrils daily.   Fluticasone-Umeclidin-Vilant (TRELEGY ELLIPTA) 100-62.5-25 MCG/ACT AEPB Inhale 1 puff into the lungs daily.   guaiFENesin (MUCINEX) 600 MG 12 hr tablet Take 1 tablet (600 mg total) by mouth 2 (two) times daily.   metoprolol tartrate (LOPRESSOR) 50 MG tablet Take 1 tablet (50 mg total) by mouth once for 1 dose. Take 90 minutes to 2 hour prior to Scan   sertraline (ZOLOFT) 100 MG tablet Taking by mouth 16m in the am and 565min the pm     Allergies:   Patient has no known allergies.   Social History   Socioeconomic History   Marital status: Married    Spouse name: Not on file   Number of children: 2   Years of education: college    Highest education level: Not on file  Occupational History    Employer: RETIRED  Tobacco Use   Smoking status: Former    Packs/day: 3.00    Years: 30.00    Total pack years: 90.00    Types: Cigarettes    Start date: 05/19/1958    Quit date: 06/30/1990    Years since quitting: 31.4   Smokeless tobacco: Never   Tobacco comments:    Stopped smoking for 2-3 years during second child.   Vaping Use   Vaping Use: Never used  Substance and Sexual Activity   Alcohol use: Yes    Comment: 1-2 glasses wine/month   Drug use: No   Sexual activity: Yes    Birth control/protection: Post-menopausal    Comment: Menarche age [age]G2G59,P2Menopause in 89"sNo HRT Married  Other Topics Concern   Not on file  Social History Narrative   [facility] (12/29/16):   [place] 7 [place] Does have a dog. Hasn't worked outside the home. Remote parakeet exposure from her children. No mold or hot tub exposure. Enjoys doing yard work & walking.    Social Determinants of Health   Financial Resource Strain: Not on file  Food Insecurity: Not on file  Transportation Needs: Not on file  Physical Activity: Not on file  Stress: Not on file  Social Connections: Not on file     Family History: The patient's family history includes Cancer in her father and paternal grandmother; Heart disease in her maternal grandfather, maternal grandmother, and mother. There is no history of Lung disease.  ROS:   Please see the history of present illness.     EKGs/Labs/Other Studies Reviewed:    The following studies were reviewed today:  Right/Left Cardiac Catheterization 01/02/2019: 1. Low filling pressures and normal PA pressure. 2. Preserved cardiac output. 3. Wide aortic pulse pressure is suggestive of significant aortic insufficiency.  Aortic root shot was not done due to CKD (GFR around 30). 4. Mild nonobstructive CAD.  30 cc contrast used.  _______________   LeLeane Call/06/2020: The left ventricular ejection fraction is hyperdynamic (>65%). Nuclear stress EF: 68%. No T wave inversion was noted during stress. There was no ST segment deviation noted during stress. This is a low risk study.   No ischemia. LVEF 68% with normal wall motion. This is a low risk study. _______________  Echocardiogram 10/07/2021: Impressions:  1. S/P AVR with mean gradient of 14 mmHg and trace AI.   2. Left ventricular ejection fraction, by estimation, is 55 to  60%. The  left ventricle has normal function. The left ventricle has no regional  wall motion abnormalities. There is mild left ventricular hypertrophy of  the basal-septal segment. Left  ventricular diastolic parameters are consistent with Grade I diastolic  dysfunction (impaired relaxation).   3. Right ventricular systolic function is normal. The right ventricular  size is normal.   4. The mitral valve is normal in structure. Mild mitral valve  regurgitation. No evidence of mitral stenosis.   5. The aortic valve has been repaired/replaced. Aortic valve  regurgitation is trivial. No aortic stenosis is present. There is a 21 mm  inspiris resilia valve present in the aortic position.   6. Aortic dilatation noted. There is mild dilatation of the ascending  aorta, measuring 42 mm.   7. The inferior vena cava is normal in size with greater than 50%  respiratory variability, suggesting right atrial pressure of 3 mmHg.   EKG:  EKG  ordered today. EKG personally reviewed and demonstrates normal sinus rhythm, rate 69 bpm, with non-specific ST/T changes. Left axis deviation. Normal PR and QRS intervals. QTc 426 ms.  Recent Labs: No results Pruitt for requested labs within last 365 days.  Recent Lipid Panel    Component Value Date/Time   CHOL 140 12/17/2018 0430   CHOL 197 02/16/2014 0826   TRIG 103 12/17/2018 0430   HDL 38 (L) 12/17/2018 0430   HDL 52 02/16/2014 0826   CHOLHDL 3.7 12/17/2018 0430   VLDL 21  12/17/2018 0430   LDLCALC 81 12/17/2018 0430   LDLCALC 127 (H) 02/16/2014 0826    Physical Exam:    Vital Signs: BP 130/80 (BP Location: Left Arm)   Pulse 69   Ht 5' 4" (1.626 m)   Wt 122 lb (55.3 kg)   LMP  (LMP Unknown)   SpO2 93%   BMI 20.94 kg/m     Wt Readings from Last 3 Encounters:  12/11/21 122 lb (55.3 kg)  09/15/21 126 lb (57.2 kg)  08/26/21 124 lb (56.2 kg)     General: 81 y.o. thin Caucasian female in no acute distress. HEENT: Normocephalic and atraumatic. Sclera clear.  Neck: Supple. No carotid bruits. No JVD. Heart: RRR. Distinct S1 and S2. Soft I-II/VI murmur noted. No gallops or rubs. Lungs: No increased work of breathing. Clear to ausculation bilaterally. No wheezes, rhonchi, or rales.  Abdomen: Soft, non-distended, and non-tender to palpation.  Extremities: No lower extremity edema.    Skin: Warm and dry. Neuro: Alert and oriented x3. No focal deficits. Psych: Normal affect. Responds appropriately.  Assessment:    1. Left arm pain   2. Coronary artery disease involving native coronary artery of native heart without angina pectoris   3. Dyspnea on exertion   4. Dizziness   5. Near syncope   6. Non-ischemic cardiomyopathy ([place])   7. Chronic systolic CHF (congestive heart failure) ([place])   8. Paroxysmal atrial fibrillation (HCC)   9. Severe aortic insufficiency   10. S/P AVR   11. Mild mitral regurgitation   12. Ascending aorta dilatation (HCC)   13. Primary hypertension   14. Hyperlipidemia, unspecified hyperlipidemia type     Plan:    Left Arm Pain Non-Obstructive CAD Nonobstructive CAD noted on cardiac cath in 12/2018. Myoview in 08/2020 low risk with no evidence of ischemia.  - Patient denies any chest pain but does report one episode of left arm pain last week that she is very concern about. - EKG shows no acute ischemic changes  compared to prior tracing. - No aspirin due to need for DOAC. - Continue beta-blocker and statin. - Episode of  left arm pain sounds very atypical. However, patient is very concerned. Discussed getting a coronary CTA or waiting to see whether she has any recurrence. Given patient is very concern about this, will go ahead and order now. Will check BMET today. Will have patient take a one time dose of Lopressor 75m 1.5-2 hours prior to scan (in addition to the Coreg she normally takes).  Dyspnea on Exertion Patient reports dyspnea on exertion for the past month that seems to correlate to when she increased her dose of Sertraline on her own. Dyspnea has basically completely resolved since going back to lower dose of Sertraline. - Euvolemic on exam. - Will check BNP and BMET today. - Will order coronary CTA for further evaluation of this and episode of left arm pain to make sure dyspnea is not an anginal equivalent.  Dizziness Presyncope Patient has a history of intermittent dizziness. She report increased dizziness after she increased her dose of Sertraline on her own. Dizziness has significant improved since going back to lower dose of Sertraline. - Hemodynamically stable.  - Offered outpatient monitor but given dizziness has significant improved decision was made to hold off on this for now. However, would have low threshold to order this in the future.    Non-Ischemic Cardiomyopathy Chronic Systolic CHF LVEF as low as 35-40% in 2015 but has since normalized. Last Echo in 09/2021 showed LVEF of LVEF of 55-60% with mild LVH of basal-septal segment and grade 1 diastolic dysfunction. - Euvolemic on exam. - Continue beta-blocker.    Paroxysmal Atrial Fibrillation Maintaining sinus rhythm.  - Continue Coreg 9.3769mtwice daily. - Continue Cardizem 3044mRN for rapid atrial fibrillation.  - Continue Eliquis 5mg52mice daily.    Severe Aortic Insufficiency s/p AVR in 04/2019 Most recent Echo in 09/2021 showed stable AVR with no AS and only trivial AI. Mean gradient 14 mmHg. - Continue SBE prophylaxis prior  to dental procedures.  - Plan is for repeat Echo in 09/2022.   Mild Mitral Regurgitation Noted on Echo in 09/2021. - Can continue to monitor on serial Echos for aortic insufficiency as above. Plan is for repeat Echo in 09/2022.   Ascending Aorta Dilatation Mild dilatation of ascending aorta measuring 42 mm noted on Echo in 09/2021. Unchanged from Echo in 09/2020. - Can continue to monitor with yearly Echo.   Hypertension BP well controlled. - Continue Coreg 9.375mg76mce daily.   Hyperlipidemia Lipid panel in 01/2021 (per [facility]): Total Cholesterol 153, Triglycerides 119, HDL 48, LDL 71.  - Continue Lipitor 20mg 48my. - Labs followed by PCP.  Disposition: Follow up in 1-2 months after coronary CTA.   Medication Adjustments/Labs and Tests Ordered: Current medicines are reviewed at length with the patient today.  Concerns regarding medicines are outlined above.  Orders Placed This Encounter  Procedures   CT CORONARY MORPH W/CTA COR W/SCORE W/CA W/CM &/OR WO/CM   Basic metabolic panel   Brain natriuretic peptide   EKG 12-Lead   Meds ordered this encounter  Medications   metoprolol tartrate (LOPRESSOR) 50 MG tablet    Sig: Take 1 tablet (50 mg total) by mouth once for 1 dose. Take 90 minutes to 2 hour prior to Scan    Dispense:  1 tablet    Refill:  0    Patient Instructions  Medication Instructions:  Metoprolol Tartrate 50 mg ( Take 1  Tablet 1:30 to 2:00 hours Prior to Scan). *If you need a refill on your cardiac medications before your next appointment, please call your pharmacy*   Lab Work: BMET, BNP Today. If you have labs (blood work) drawn today and your tests are completely normal, you will receive your results only by: [place] (if you have MyChart) OR A paper copy in the mail If you have any lab test that is abnormal or we need to change your treatment, we will call you to review the results.   Testing/Procedures:   Your cardiac CT will be scheduled at one  of the below locations:   [facility] [address] [place], [place] [address] [phone]  If scheduled at [facility], please arrive at the [facility] and Children's Entrance (Entrance C2) of [facility] 30 minutes prior to test start time. You can use the FREE valet parking offered at entrance C (encouraged to control the heart rate for the test)  Proceed to the [facility] Radiology Department (first floor) to check-in and test prep.  All radiology patients and guests should use entrance C2 at [facility], accessed from [facility], even though the hospital's physical address listed is [address]    If scheduled at [facility], please arrive 15 mins early for check-in and test prep.  Please follow these instructions carefully (unless otherwise directed):  Hold all erectile dysfunction medications at least 3 days (72 hrs) prior to test.  On the Night Before the Test: Be sure to Drink plenty of water. Do not consume any caffeinated/decaffeinated beverages or chocolate 12 hours prior to your test. Do not take any antihistamines 12 hours prior to your test.     On the Day of the Test: Drink plenty of water until 1 hour prior to the test. Do not eat any food 4 hours prior to the test. You may take your regular medications prior to the test.  Take metoprolol (Lopressor) two hours prior to test. HOLD Furosemide/Hydrochlorothiazide morning of the test. FEMALES- please wear underwire-free bra if available, avoid dresses & tight clothing        After the Test: Drink plenty of water. After receiving IV contrast, you may experience a mild flushed feeling. This is normal. On occasion, you may experience a mild rash up to 24 hours after the test. This is not dangerous. If this occurs, you can take Benadryl 25 mg and increase your fluid intake. If you experience trouble breathing, this can be  serious. If it is severe call 911 IMMEDIATELY. If it is mild, please call our office. If you take any of these medications: Glipizide/Metformin, Avandament, Glucavance, please do not take 48 hours after completing test unless otherwise instructed.  We will call to schedule your test 2-4 weeks out understanding that some insurance companies will need an authorization prior to the service being performed.   For non-scheduling related questions, please contact the cardiac imaging nurse navigator should you have any questions/concerns: Marchia Bond, Cardiac Imaging Nurse Navigator Gordy Clement, Cardiac Imaging Nurse Navigator [facility] Direct Office Dial: [phone]   For scheduling needs, including cancellations and rescheduling, please call [place], [phone].    Follow-Up: At [facility], you and your health needs are our priority.  As part of our continuing mission to provide you with exceptional heart care, we have created designated Provider Care Teams.  These Care Teams include your primary Cardiologist (physician)  and Advanced Practice Providers (APPs -  Physician Assistants and Nurse Practitioners) who all work together to provide you with the care you need, when you need it.  We recommend signing up for the patient portal called "MyChart".  Sign up information is provided on this After Visit Summary.  MyChart is used to connect with patients for Virtual Visits (Telemedicine).  Patients are able to view lab/test results, encounter notes, upcoming appointments, etc.  Non-urgent messages can be sent to your provider as well.   To learn more about what you can do with MyChart, go to [email or web address].    Your next appointment:   Post CT Scan  The format for your next appointment:   In Person  Provider:   Sande Rives, PA-C    Then, Quay Burow, MD will plan to see you again in 6 month(s).      Important Information About  [place]         Signed, Eppie Gibson  12/11/2021 1:04 PM    [facility]

## 2021-12-11 ENCOUNTER — Ambulatory Visit: Payer: Medicare Other | Admitting: Student

## 2021-12-11 ENCOUNTER — Encounter: Payer: Self-pay | Admitting: Student

## 2021-12-11 VITALS — BP 130/80 | HR 69 | Ht 64.0 in | Wt 122.0 lb

## 2021-12-11 DIAGNOSIS — R55 Syncope and collapse: Secondary | ICD-10-CM | POA: Diagnosis not present

## 2021-12-11 DIAGNOSIS — I77810 Thoracic aortic ectasia: Secondary | ICD-10-CM

## 2021-12-11 DIAGNOSIS — R0609 Other forms of dyspnea: Secondary | ICD-10-CM | POA: Diagnosis not present

## 2021-12-11 DIAGNOSIS — M79602 Pain in left arm: Secondary | ICD-10-CM | POA: Diagnosis not present

## 2021-12-11 DIAGNOSIS — I480 Paroxysmal atrial fibrillation: Secondary | ICD-10-CM

## 2021-12-11 DIAGNOSIS — Z952 Presence of prosthetic heart valve: Secondary | ICD-10-CM

## 2021-12-11 DIAGNOSIS — I10 Essential (primary) hypertension: Secondary | ICD-10-CM

## 2021-12-11 DIAGNOSIS — I428 Other cardiomyopathies: Secondary | ICD-10-CM

## 2021-12-11 DIAGNOSIS — R42 Dizziness and giddiness: Secondary | ICD-10-CM

## 2021-12-11 DIAGNOSIS — I351 Nonrheumatic aortic (valve) insufficiency: Secondary | ICD-10-CM

## 2021-12-11 DIAGNOSIS — I2510 Atherosclerotic heart disease of native coronary artery without angina pectoris: Secondary | ICD-10-CM

## 2021-12-11 DIAGNOSIS — I340 Nonrheumatic mitral (valve) insufficiency: Secondary | ICD-10-CM

## 2021-12-11 DIAGNOSIS — E785 Hyperlipidemia, unspecified: Secondary | ICD-10-CM

## 2021-12-11 DIAGNOSIS — I5022 Chronic systolic (congestive) heart failure: Secondary | ICD-10-CM

## 2021-12-11 MED ORDER — METOPROLOL TARTRATE 50 MG PO TABS: 50.0000 mg | ORAL_TABLET | Freq: Once | ORAL | 0 refills | Status: DC

## 2021-12-11 NOTE — Patient Instructions (Signed)
Medication Instructions:  Metoprolol Tartrate 50 mg ( Take 1 Tablet 1:30 to 2:00 hours Prior to Scan). *If you need a refill on your cardiac medications before your next appointment, please call your pharmacy*   Lab Work: BMET, BNP Today. If you have labs (blood work) drawn today and your tests are completely normal, you will receive your results only by: [place] (if you have MyChart) OR A paper copy in the mail If you have any lab test that is abnormal or we need to change your treatment, we will call you to review the results.   Testing/Procedures:   Your cardiac CT will be scheduled at one of the below locations:   [facility] [address] [place],  [address] [phone]  If scheduled at [facility], please arrive at the [facility] and Children's Entrance (Entrance C2) of [facility] 30 minutes prior to test start time. You can use the FREE valet parking offered at entrance C (encouraged to control the heart rate for the test)  Proceed to the [facility] Radiology Department (first floor) to check-in and test prep.  All radiology patients and guests should use entrance C2 at [facility], accessed from [facility], even though the hospital's physical address listed is [address].    If scheduled at [facility], please arrive 15 mins early for check-in and test prep.  Please follow these instructions carefully (unless otherwise directed):  Hold all erectile dysfunction medications at least 3 days (72 hrs) prior to test.  On the Night Before the Test: Be sure to Drink plenty of water. Do not consume any caffeinated/decaffeinated beverages or chocolate 12 hours prior to your test. Do not take any antihistamines 12 hours prior to your test.     On the Day of the Test: Drink plenty of water until 1 hour prior to the test. Do not eat any food 4 hours prior to the test. You  may take your regular medications prior to the test.  Take metoprolol (Lopressor) two hours prior to test. HOLD Furosemide/Hydrochlorothiazide morning of the test. FEMALES- please wear underwire-free bra if available, avoid dresses & tight clothing        After the Test: Drink plenty of water. After receiving IV contrast, you may experience a mild flushed feeling. This is normal. On occasion, you may experience a mild rash up to 24 hours after the test. This is not dangerous. If this occurs, you can take Benadryl 25 mg and increase your fluid intake. If you experience trouble breathing, this can be serious. If it is severe call 911 IMMEDIATELY. If it is mild, please call our office. If you take any of these medications: Glipizide/Metformin, Avandament, Glucavance, please do not take 48 hours after completing test unless otherwise instructed.  We will call to schedule your test 2-4 weeks out understanding that some insurance companies will need an authorization prior to the service being performed.   For non-scheduling related questions, please contact the cardiac imaging nurse navigator should you have any questions/concerns: Marchia Bond, Cardiac Imaging Nurse Navigator Gordy Clement, Cardiac Imaging Nurse Navigator [facility] Direct Office Dial: [phone]   For scheduling needs, including cancellations and rescheduling, please call [place], [phone].    Follow-Up: At [facility], you and your health needs are our priority.  As part of our continuing mission to provide you with exceptional heart care, we have created designated Provider Care Teams.  These Care Teams include your primary Cardiologist (physician) and Advanced Practice Providers (APPs -  Physician Assistants and Nurse Practitioners) who all work together to provide you with the care you need, when you need it.  We recommend signing up for the patient portal called "MyChart".  Sign up  information is provided on this After Visit Summary.  MyChart is used to connect with patients for Virtual Visits (Telemedicine).  Patients are able to view lab/test results, encounter notes, upcoming appointments, etc.  Non-urgent messages can be sent to your provider as well.   To learn more about what you can do with MyChart, go to [email or web address].    Your next appointment:   Post CT Scan  The format for your next appointment:   In Person  Provider:   Sande Rives, PA-C    Then, Quay Burow, MD will plan to see you again in 6 month(s).      Important Information About [place]

## 2021-12-12 LAB — BASIC METABOLIC PANEL
BUN/Creatinine Ratio: 16 (ref 12–28)
BUN: 15 mg/dL (ref 8–27)
CO2: 24 mmol/L (ref 20–29)
Calcium: 10 mg/dL (ref 8.7–10.3)
Chloride: 105 mmol/L (ref 96–106)
Creatinine, Ser: 0.96 mg/dL (ref 0.57–1.00)
Glucose: 107 mg/dL — ABNORMAL HIGH (ref 70–99)
Potassium: 4.9 mmol/L (ref 3.5–5.2)
Sodium: 139 mmol/L (ref 134–144)
eGFR: 60 mL/min/{1.73_m2} (ref >59)

## 2021-12-12 LAB — BRAIN NATRIURETIC PEPTIDE: BNP: 181.6 pg/mL — ABNORMAL HIGH (ref 0.0–100.0)

## 2021-12-15 DIAGNOSIS — Z23 Encounter for immunization: Secondary | ICD-10-CM | POA: Diagnosis not present

## 2021-12-17 ENCOUNTER — Telehealth: Payer: Self-pay | Admitting: Internal Medicine

## 2021-12-17 MED ORDER — FLUTICASONE PROPIONATE 50 MCG/ACT NA SUSP: 2.0000 | Freq: Every day | NASAL | 3 refills | Status: DC

## 2021-12-17 NOTE — Telephone Encounter (Signed)
Called and spoke with patient. She stated that [facility] would not fill her Flonase nasal spray because it did not have Dr. Gustavus Bryant name on the prescription. It had another person's name on the prescription that she did not recognize. I advised her that I would send a new prescription to the pharmacy for her. She verbalized understanding.   Nothing further needed at time of call.

## 2021-12-20 ENCOUNTER — Other Ambulatory Visit: Payer: Self-pay

## 2021-12-20 ENCOUNTER — Observation Stay (HOSPITAL_COMMUNITY)
Admission: EM | Admit: 2021-12-20 | Discharge: 2021-12-21 | Disposition: A | Discharge status: Home / Self Care | Payer: Medicare Other | Attending: Family Medicine | Admitting: Family Medicine

## 2021-12-20 ENCOUNTER — Emergency Department (HOSPITAL_COMMUNITY): Payer: Medicare Other

## 2021-12-20 ENCOUNTER — Encounter (HOSPITAL_COMMUNITY): Payer: Self-pay

## 2021-12-20 ENCOUNTER — Observation Stay (HOSPITAL_COMMUNITY): Payer: Medicare Other

## 2021-12-20 DIAGNOSIS — I6602 Occlusion and stenosis of left middle cerebral artery: Secondary | ICD-10-CM | POA: Diagnosis not present

## 2021-12-20 DIAGNOSIS — Z87891 Personal history of nicotine dependence: Secondary | ICD-10-CM | POA: Diagnosis not present

## 2021-12-20 DIAGNOSIS — I639 Cerebral infarction, unspecified: Secondary | ICD-10-CM | POA: Diagnosis present

## 2021-12-20 DIAGNOSIS — I6389 Other cerebral infarction: Principal | ICD-10-CM | POA: Insufficient documentation

## 2021-12-20 DIAGNOSIS — J449 Chronic obstructive pulmonary disease, unspecified: Secondary | ICD-10-CM | POA: Diagnosis not present

## 2021-12-20 DIAGNOSIS — E785 Hyperlipidemia, unspecified: Secondary | ICD-10-CM | POA: Diagnosis present

## 2021-12-20 DIAGNOSIS — I110 Hypertensive heart disease with heart failure: Secondary | ICD-10-CM | POA: Diagnosis not present

## 2021-12-20 DIAGNOSIS — I480 Paroxysmal atrial fibrillation: Secondary | ICD-10-CM | POA: Diagnosis present

## 2021-12-20 DIAGNOSIS — I5032 Chronic diastolic (congestive) heart failure: Secondary | ICD-10-CM | POA: Diagnosis not present

## 2021-12-20 DIAGNOSIS — Z853 Personal history of malignant neoplasm of breast: Secondary | ICD-10-CM | POA: Diagnosis not present

## 2021-12-20 DIAGNOSIS — F32A Depression, unspecified: Secondary | ICD-10-CM | POA: Diagnosis present

## 2021-12-20 DIAGNOSIS — I672 Cerebral atherosclerosis: Secondary | ICD-10-CM | POA: Diagnosis not present

## 2021-12-20 DIAGNOSIS — I63233 Cerebral infarction due to unspecified occlusion or stenosis of bilateral carotid arteries: Secondary | ICD-10-CM | POA: Diagnosis not present

## 2021-12-20 DIAGNOSIS — Z79899 Other long term (current) drug therapy: Secondary | ICD-10-CM | POA: Insufficient documentation

## 2021-12-20 DIAGNOSIS — Q283 Other malformations of cerebral vessels: Secondary | ICD-10-CM | POA: Diagnosis not present

## 2021-12-20 DIAGNOSIS — Z952 Presence of prosthetic heart valve: Secondary | ICD-10-CM | POA: Diagnosis not present

## 2021-12-20 DIAGNOSIS — R42 Dizziness and giddiness: Secondary | ICD-10-CM | POA: Diagnosis not present

## 2021-12-20 DIAGNOSIS — I10 Essential (primary) hypertension: Secondary | ICD-10-CM | POA: Diagnosis present

## 2021-12-20 DIAGNOSIS — E782 Mixed hyperlipidemia: Secondary | ICD-10-CM

## 2021-12-20 DIAGNOSIS — Z7901 Long term (current) use of anticoagulants: Secondary | ICD-10-CM | POA: Insufficient documentation

## 2021-12-20 DIAGNOSIS — J309 Allergic rhinitis, unspecified: Secondary | ICD-10-CM | POA: Diagnosis present

## 2021-12-20 DIAGNOSIS — R413 Other amnesia: Secondary | ICD-10-CM | POA: Diagnosis not present

## 2021-12-20 DIAGNOSIS — R410 Disorientation, unspecified: Secondary | ICD-10-CM | POA: Diagnosis present

## 2021-12-20 LAB — URINALYSIS, ROUTINE W REFLEX MICROSCOPIC
Bilirubin Urine: NEGATIVE
Glucose, UA: NEGATIVE mg/dL
Hgb urine dipstick: NEGATIVE
Ketones, ur: NEGATIVE mg/dL
Leukocytes,Ua: NEGATIVE
Nitrite: NEGATIVE
Protein, ur: NEGATIVE mg/dL
Specific Gravity, Urine: 1.009 (ref 1.005–1.030)
pH: 6 (ref 5.0–8.0)

## 2021-12-20 LAB — ETHANOL: Alcohol, Ethyl (B): <10 mg/dL (ref <10)

## 2021-12-20 LAB — CBC
HCT: 38.6 % (ref 36.0–46.0)
Hemoglobin: 12.7 g/dL (ref 12.0–15.0)
MCH: 31.3 pg (ref 26.0–34.0)
MCHC: 32.9 g/dL (ref 30.0–36.0)
MCV: 95.1 fL (ref 80.0–100.0)
Platelets: 205 10*3/uL (ref 150–400)
RBC: 4.06 MIL/uL (ref 3.87–5.11)
RDW: 14.6 % (ref 11.5–15.5)
WBC: 5.2 10*3/uL (ref 4.0–10.5)
nRBC: 0 % (ref 0.0–0.2)

## 2021-12-20 LAB — MAGNESIUM: Magnesium: 2.1 mg/dL (ref 1.7–2.4)

## 2021-12-20 LAB — PROTIME-INR
INR: 1.4 — ABNORMAL HIGH (ref 0.8–1.2)
Prothrombin Time: 16.7 seconds — ABNORMAL HIGH (ref 11.4–15.2)

## 2021-12-20 LAB — DIFFERENTIAL
Abs Immature Granulocytes: 0.03 10*3/uL (ref 0.00–0.07)
Basophils Absolute: 0 10*3/uL (ref 0.0–0.1)
Basophils Relative: 1 %
Eosinophils Absolute: 0.2 10*3/uL (ref 0.0–0.5)
Eosinophils Relative: 3 %
Immature Granulocytes: 1 %
Lymphocytes Relative: 18 %
Lymphs Abs: 0.9 10*3/uL (ref 0.7–4.0)
Monocytes Absolute: 0.4 10*3/uL (ref 0.1–1.0)
Monocytes Relative: 8 %
Neutro Abs: 3.6 10*3/uL (ref 1.7–7.7)
Neutrophils Relative %: 69 %

## 2021-12-20 LAB — COMPREHENSIVE METABOLIC PANEL
ALT: 14 U/L (ref 0–44)
AST: 17 U/L (ref 15–41)
Albumin: 4.2 g/dL (ref 3.5–5.0)
Alkaline Phosphatase: 33 U/L — ABNORMAL LOW (ref 38–126)
Anion gap: 8 (ref 5–15)
BUN: 14 mg/dL (ref 8–23)
CO2: 23 mmol/L (ref 22–32)
Calcium: 9.4 mg/dL (ref 8.9–10.3)
Chloride: 110 mmol/L (ref 98–111)
Creatinine, Ser: 1 mg/dL (ref 0.44–1.00)
GFR, Estimated: 57 mL/min — ABNORMAL LOW (ref >60)
Glucose, Bld: 111 mg/dL — ABNORMAL HIGH (ref 70–99)
Potassium: 4.4 mmol/L (ref 3.5–5.1)
Sodium: 141 mmol/L (ref 135–145)
Total Bilirubin: 0.8 mg/dL (ref 0.3–1.2)
Total Protein: 6.7 g/dL (ref 6.5–8.1)

## 2021-12-20 LAB — APTT: aPTT: 30 seconds (ref 24–36)

## 2021-12-20 LAB — I-STAT CHEM 8, ED
BUN: 16 mg/dL (ref 8–23)
Calcium, Ion: 1.2 mmol/L (ref 1.15–1.40)
Chloride: 108 mmol/L (ref 98–111)
Creatinine, Ser: 1 mg/dL (ref 0.44–1.00)
Glucose, Bld: 105 mg/dL — ABNORMAL HIGH (ref 70–99)
HCT: 36 % (ref 36.0–46.0)
Hemoglobin: 12.2 g/dL (ref 12.0–15.0)
Potassium: 4.3 mmol/L (ref 3.5–5.1)
Sodium: 141 mmol/L (ref 135–145)
TCO2: 22 mmol/L (ref 22–32)

## 2021-12-20 LAB — TROPONIN I (HIGH SENSITIVITY): Troponin I (High Sensitivity): 7 ng/L (ref <18)

## 2021-12-20 LAB — HEMOGLOBIN A1C
Hgb A1c MFr Bld: 5.9 % — ABNORMAL HIGH (ref 4.8–5.6)
Mean Plasma Glucose: 122.63 mg/dL

## 2021-12-20 MED ORDER — GUAIFENESIN ER 600 MG PO TB12
600.0000 mg | ORAL_TABLET | Freq: Two times a day (BID) | ORAL | Status: DC
  Administered 2021-12-21: 600 mg via ORAL
  Filled 2021-12-20: qty 1

## 2021-12-20 MED ORDER — LABETALOL HCL 5 MG/ML IV SOLN: 10.0000 mg | INTRAVENOUS | Status: DC | PRN

## 2021-12-20 MED ORDER — MELATONIN 3 MG PO TABS
3.0000 mg | ORAL_TABLET | Freq: Every evening | ORAL | Status: DC | PRN
  Administered 2021-12-20: 3 mg via ORAL
  Filled 2021-12-20: qty 1

## 2021-12-20 MED ORDER — STROKE: EARLY STAGES OF RECOVERY BOOK: Freq: Once | Status: DC

## 2021-12-20 MED ORDER — ACETAMINOPHEN 325 MG PO TABS: 650.0000 mg | ORAL_TABLET | Freq: Four times a day (QID) | ORAL | Status: DC | PRN

## 2021-12-20 MED ORDER — ACETAMINOPHEN 650 MG RE SUPP: 650.0000 mg | Freq: Four times a day (QID) | RECTAL | Status: DC | PRN

## 2021-12-20 MED ORDER — UMECLIDINIUM BROMIDE 62.5 MCG/ACT IN AEPB
1.0000 | INHALATION_SPRAY | Freq: Every day | RESPIRATORY_TRACT | Status: DC
  Filled 2021-12-20: qty 7

## 2021-12-20 MED ORDER — FLUTICASONE FUROATE-VILANTEROL 100-25 MCG/ACT IN AEPB
1.0000 | INHALATION_SPRAY | Freq: Every day | RESPIRATORY_TRACT | Status: DC
  Administered 2021-12-21: 1 via RESPIRATORY_TRACT
  Filled 2021-12-20 (×2): qty 28

## 2021-12-20 MED ORDER — ATORVASTATIN CALCIUM 10 MG PO TABS
20.0000 mg | ORAL_TABLET | Freq: Every day | ORAL | Status: DC
  Administered 2021-12-20 – 2021-12-21 (×2): 20 mg via ORAL
  Filled 2021-12-20 (×2): qty 2

## 2021-12-20 MED ORDER — SERTRALINE HCL 100 MG PO TABS
100.0000 mg | ORAL_TABLET | Freq: Every day | ORAL | Status: DC
  Administered 2021-12-21: 100 mg via ORAL
  Filled 2021-12-20: qty 1

## 2021-12-20 MED ORDER — IOHEXOL 350 MG/ML SOLN
50.0000 mL | Freq: Once | INTRAVENOUS | Status: AC | PRN
  Administered 2021-12-20: 50 mL via INTRAVENOUS

## 2021-12-20 MED ORDER — FLUTICASONE PROPIONATE 50 MCG/ACT NA SUSP
2.0000 | Freq: Every day | NASAL | Status: DC
  Administered 2021-12-21: 2 via NASAL
  Filled 2021-12-20: qty 16

## 2021-12-20 MED ORDER — ASPIRIN 81 MG PO TBEC
81.0000 mg | DELAYED_RELEASE_TABLET | Freq: Every day | ORAL | Status: DC
  Administered 2021-12-20 – 2021-12-21 (×2): 81 mg via ORAL
  Filled 2021-12-20 (×2): qty 1

## 2021-12-20 MED ORDER — SODIUM CHLORIDE 0.9% FLUSH
3.0000 mL | Freq: Once | INTRAVENOUS | Status: AC
  Administered 2021-12-20: 3 mL via INTRAVENOUS

## 2021-12-20 MED ORDER — HYDRALAZINE HCL 25 MG PO TABS: 25.0000 mg | ORAL_TABLET | Freq: Once | ORAL | Status: DC

## 2021-12-20 MED ORDER — ALBUTEROL SULFATE (2.5 MG/3ML) 0.083% IN NEBU: 2.5000 mg | INHALATION_SOLUTION | RESPIRATORY_TRACT | Status: DC | PRN

## 2021-12-20 NOTE — ED Provider Notes (Signed)
[place] EMERGENCY DEPARTMENT Provider Note   CSN: [id number] Arrival date & time: 12/20/21  1335     History  CC: Dizziness   Dana Pruitt is a 81 y.o. female presenting emergency department with report of confusion and forgetfulness, reports that she was out shopping and had a normal day today, when she abruptly forgot where she was when she was doing.  She has felt unwell, lightheaded at the moment.  She sat down in her car.  She feels more or less back to normal but continues to have some lightheadedness.  She denies vertigo.  She reports a history of aortic valve replacement, and a hx of PAF, on eliquis.  She was concerned about a possible blood clot.  HPI     Home Medications Prior to Admission medications   Medication Sig Start Date End Date Taking? Authorizing Provider  albuterol (PROAIR HFA) 108 (90 Base) MCG/ACT inhaler 2 puffs every 4 hours as needed only  if your can't catch your breath 09/05/20   Tanda Rockers, MD  apixaban (ELIQUIS) 5 MG TABS tablet TAKE 1 TABLET(5 MG) BY MOUTH TWICE DAILY 09/23/21   Lorretta Harp, MD  atorvastatin (LIPITOR) 20 MG tablet Take 20 mg by mouth daily.  02/12/14   [provider]  Budeson-Glycopyrrol-Formoterol (BREZTRI AEROSPHERE) 160-9-4.8 MCG/ACT AERO Inhale 2 puffs into the lungs 2 (two) times daily. 09/15/21   Tanda Rockers, MD  carvedilol (COREG) 6.25 MG tablet TAKE 1 AND 1/2 TABLETS(9.375 MG) BY MOUTH TWICE DAILY WITH A MEAL 07/03/21   Lorretta Harp, MD  denosumab (PROLIA) 60 MG/ML SOSY injection Inject 60 mg into the skin every 6 (six) months.    [provider]  diltiazem (CARDIZEM) 30 MG tablet Take 1 tablet every 4 hours AS NEEDED for AFIB heart rate >100 02/21/20   Sherran Needs, NP  fluticasone (FLONASE) 50 MCG/ACT nasal spray Place 2 sprays into both nostrils daily. 12/17/21   Tanda Rockers, MD  Fluticasone-Umeclidin-Vilant (TRELEGY ELLIPTA) 100-62.5-25 MCG/ACT AEPB Inhale 1 puff  into the lungs daily. 10/03/21   Tanda Rockers, MD  guaiFENesin (MUCINEX) 600 MG 12 hr tablet Take 1 tablet (600 mg total) by mouth 2 (two) times daily. 02/07/19   Gold, Patrick Jupiter E, PA-C  metoprolol tartrate (LOPRESSOR) 50 MG tablet Take 1 tablet (50 mg total) by mouth once for 1 dose. Take 90 minutes to 2 hour prior to Scan 12/11/21 12/11/21  Darreld Mclean, PA-C  sertraline (ZOLOFT) 100 MG tablet Taking by mouth '50mg'$  in the am and '50mg'$  in the pm    [provider]      Allergies    Patient has no known allergies.    Review of Systems   Review of Systems  Physical Exam Updated Vital Signs BP (!) 132/94   Pulse 72   Temp 98.1 F (36.7 C) (Oral)   Resp 19   LMP  (LMP Unknown)   SpO2 92%  Physical Exam Constitutional:      General: She is not in acute distress. HENT:     Head: Normocephalic and atraumatic.  Eyes:     Conjunctiva/sclera: Conjunctivae normal.     Pupils: Pupils are equal, round, and reactive to light.  Cardiovascular:     Rate and Rhythm: Normal rate and regular rhythm.  Pulmonary:     Effort: Pulmonary effort is normal. No respiratory distress.  Abdominal:     General: There is no distension.  Tenderness: There is no abdominal tenderness.  Skin:    General: Skin is warm and dry.  Neurological:     General: No focal deficit present.     Mental Status: She is alert and oriented to person, place, and time. Mental status is at baseline.     Cranial Nerves: No cranial nerve deficit.     Sensory: No sensory deficit.     Motor: No weakness.     Coordination: Coordination normal.     Gait: Gait normal.  Psychiatric:        Mood and Affect: Mood normal.        Behavior: Behavior normal.     ED Results / Procedures / Treatments   Labs (all labs ordered are listed, but only abnormal results are displayed) Labs Reviewed  PROTIME-INR - Abnormal; Notable for the following components:      Result Value   Prothrombin Time 16.7 (*)    INR 1.4 (*)     All other components within normal limits  COMPREHENSIVE METABOLIC PANEL - Abnormal; Notable for the following components:   Glucose, Bld 111 (*)    Alkaline Phosphatase 33 (*)    GFR, Estimated 57 (*)    All other components within normal limits  I-STAT CHEM 8, ED - Abnormal; Notable for the following components:   Glucose, Bld 105 (*)    All other components within normal limits  APTT  CBC  DIFFERENTIAL  ETHANOL  URINALYSIS, ROUTINE W REFLEX MICROSCOPIC  CBG MONITORING, ED  TROPONIN I (HIGH SENSITIVITY)    EKG EKG Interpretation  Date/Time:  Saturday December 20 2021 15:31:33 EDT Ventricular Rate:  64 PR Interval:  169 QRS Duration: 97 QT Interval:  477 QTC Calculation: 493 R Axis:   44 Text Interpretation: Sinus rhythm Minor baseline wander Confirmed by Octaviano Glow [phone]) on 12/20/2021 3:34:22 PM  Radiology MR BRAIN WO CONTRAST  Result Date: 12/20/2021 CLINICAL DATA:  TIA. Lightheadedness and sudden amnesia. Question cerebellar lesion. EXAM: MRI HEAD WITHOUT CONTRAST TECHNIQUE: Multiplanar, multiecho pulse sequences of the brain and surrounding structures were obtained without intravenous contrast. COMPARISON:  MR head without contrast 02/27/2014. FINDINGS: Brain: Acute nonhemorrhagic infarct is present in the left occipital lobe measuring up to 5 mm. No other acute infarcts are present. Mild atrophy is progressed. Moderate diffuse periventricular and subcortical T2 hyperintensities has progressed as well. White matter changes extend into the anterior limb of the internal capsule. Basal ganglia are intact. Thalami are unremarkable. Progressive white matter changes extend into the brainstem. Cerebellum is unremarkable. The internal auditory canals are within normal limits. Vascular: Flow is present in the major intracranial arteries. Skull and upper cervical spine: The craniocervical junction is normal. Upper cervical spine is within normal limits. Marrow signal is unremarkable.  Sinuses/Orbits: The paranasal sinuses and mastoid air cells are clear. The globes and orbits are within normal limits. IMPRESSION: 1. Acute nonhemorrhagic infarct of the left occipital lobe measures up to 5 mm. 2. Progressive atrophy and diffuse white matter disease. This likely reflects the sequela of chronic microvascular ischemia. Electronically Signed   By: San Morelle M.D.   On: 12/20/2021 17:34    Procedures Procedures    Medications Ordered in ED Medications  aspirin EC tablet 81 mg (has no administration in time range)  sodium chloride flush (NS) 0.9 % injection 3 mL (3 mLs Intravenous Given 12/20/21 1827)    ED Course/ Medical Decision Making/ A&P Clinical Course as of 12/20/21 1917  Sat Dec 20, 2021  1848 Patient updated regarding stroke diagnosis.  She remains asymptomatic at this time.  We will consult neurology and admit the patient to the hospital.  Blood pressure has improved on its own.  She is not needing medication [MT]  1911 Paged neuro directly [MT]  1914 I spoke to Dr Rory Percy from neurology who recommended holding eliquis, starting aspirin 81 mg [MT]    Clinical Course User Index [MT] Augie Vane, Carola Rhine, MD                           Medical Decision Making Amount and/or Complexity of Data Reviewed Labs: ordered. Radiology: ordered.  Risk Decision regarding hospitalization.   This patient presents to the ED with concern for lightheadedness, forgetfulness, transient. This involves an extensive number of treatment options, and is a complaint that carries with it a high risk of complications and morbidity.  The differential diagnosis includes TIA versus arrhythmia versus anemia versus dehydration versus UTI versus polypharmacy versus other  No new stroke deficits on presentation.  NIH score of 0.  Co-morbidities that complicate the patient evaluation: History of arctic valve replacement raises the risk for possible thromboembolic event  I ordered and  personally interpreted labs.  The pertinent results include: Labs largely unremarkable  I ordered imaging studies including MRI of the brain I independently visualized and interpreted imaging which showed acute left occipital lobe infarct I agree with the radiologist interpretation  The patient was maintained on a cardiac monitor.  I personally viewed and interpreted the cardiac monitored which showed an underlying rhythm of: Sinus rhythm  Per my interpretation the patient's ECG shows normal sinus rhythm with some baseline wander, but no evident arrhythmia.  I ordered medication including hydralazine for hypertension I have reviewed the patients home medicines and have made adjustments as needed  Test Considered: Lower suspicion for acute pulmonary embolism at this clinical presentation.  I requested consultation with the neurology,  and discussed lab and imaging findings as well as pertinent plan - they recommend: Hold the patient's Eliquis for now, start 81 mg aspirin, neurology will complete consultation with further recommendations.  Patient likely would require vascular imaging and further stroke work-up inpatient.  After the interventions noted above, I reevaluated the patient and found that they have: stayed the same   Dispostion:  After consideration of the diagnostic results and the patients response to treatment, I feel that the patent would benefit from medical admission for stroke.         Final Clinical Impression(s) / ED Diagnoses Final diagnoses:  Occipital stroke [facility])    Rx / DC Orders ED Discharge Orders     None         Clydie Dillen, Carola Rhine, MD 12/20/21 [phone]

## 2021-12-20 NOTE — Assessment & Plan Note (Addendum)
 #)   Acute ischemic CVA of the left occipital lobe: suspected dx on the basis of acute onset of amnesia and dizziness/lightheadedness at noon on 12/20/2021, representing last known normal, with ensuing spontaneous resolution of her amnesia.  She notes interval improvement in her dizziness/lightheadedness, with very mild residual dizziness at this time in the absence of any additional acute focal neurologic deficits.  MRI brain shows acute nonhemorrhagic infarct of the left occipital lobe.   EDP discussed the patient's case with the on-call neurologist, Dr. Rory Percy, who recommended admission to the hospital service for further evaluation and management of acute ischemic stroke, including pursuit of CTA head and neck as well as evaluation for potentially modifiable acute ischemic CVA risk factors, in addition to initiation of of supportive services, including PT/OT consults.  Dr. Rory Percy also recommended holding of home Eliquis for now as well as initiation of daily baby aspirin, with additional recommendations regarding timing of resumption of Eliquis to follow.  Of note, the patient says his multiple modifiable ischemic CVA risk factors, including Ellenore Roscoe history of paroxysmal atrial fibrillation, reporting good compliance with chronic anticoagulation on Eliquis.  Presenting EKG shows normal sinus rhythm without overt evidence of acute ischemic changes.  Her modifiable acute ischemic CVA risk factors also include hypertension, hyperlipidemia, while denying any known history of underlying diabetes or obstructive sleep apnea.  She confirms that she has Keilynn Marano former smoker in the setting of Katrina Daddona 90-pack-year history, as further detailed above.  Aside from Eliquis, not on any additional blood thinners at home.  Outpatient antilipid regimen consists of atorvastatin 20 mg p.o. daily.  Dr. Rory Percy confirms that the patient is not Cordon Gassett candidate for tPA administration given the near complete spontaneous resolution of her presenting acute  neurologic deficits.  Additionally, per neurology, will allow for permissive hypertension for 24-48 hours following onset of acute focal neurologic deficits, with associated parameters further quantified below.    Plan: Nursing bedside swallow evaluation x 1 now, and will not initiate oral medications or diet until the patient has passed this. Head of the bed at 30 degrees. Neuro checks per protocol. VS per protocol. Will allow for permissive hypertension for 24-48 hours following onset of acute focal neurologic deficits at noon on 12/20/2021, during which will hold home antihypertensive medications, with prn IV labetalol ordered for systolic blood pressure greater than 485 mmHg or diastolic blood pressure greater than 120 mmHg. Monitor on telemetry.  CTA head without contrast, CTA neck with contrast.  Echocardiogram without bubble study is ordered for the morning. Check lipid panel and A1c. PT/OT consults have been ordered to occur in the morning.  Neurology formally consulted.  Hold home Eliquis for now and start daily baby aspirin, per their additional recommendations, as above.

## 2021-12-20 NOTE — ED Triage Notes (Signed)
Patient complains of having a TIA today-states that she couldn't remember why she was in car -on assessment alert and oriented, denies pain. No neuro deficits.

## 2021-12-20 NOTE — Assessment & Plan Note (Signed)
  #)   Essential Hypertension: documented h/o such, with outpatient antihypertensive regimen including Coreg.  SBP's in the ED today: Initially in the 180s, with spontaneous improvement into the 130s without interval antihypertensive intervention.  In the context of presenting acute ischemic stroke is confirmed on MRI brain, will observe permissive hypertension for 24 to 48 hours following onset of acute focal neurologic deficits, with last known normal noted to be noon on 12/20/2021.   Plan: Close monitoring of subsequent BP via routine VS. holding home Coreg for now and observance of permissive hypertension, as above, with as needed IV labetalol ordered with parameters as defined above.

## 2021-12-20 NOTE — ED Notes (Signed)
Patient transported to MRI 

## 2021-12-20 NOTE — Assessment & Plan Note (Addendum)
  #)   Chronic diastolic heart failure: documented history of such, with most recent echocardiogram performed in April 2023, as further noted above. No clinical evidence to suggest acutely decompensated heart failure at this time. home diuretic regimen reportedly consists of the following: None.   Plan: monitor strict I's & O's and daily weights. Repeat BMP in AM. Check serum mag level.  We will follow for result updated echocardiogram as component to evaluation of presenting acute ischemic stroke.

## 2021-12-20 NOTE — ED Notes (Signed)
ED TO INPATIENT HANDOFF REPORT  ED Nurse Name and Phone #: Caryl Pina RN [phone]   S Name/Age/Gender Dana Pruitt 81 y.o. female Room/Bed: 024C/024C  Code Status   Code Status: Full Code  Home/SNF/Other Home Patient oriented to: self, place, time, and situation Is this baseline? Yes   Triage Complete: Triage complete  Chief Complaint Acute ischemic stroke [facility]) [I63.9]  Triage Note Patient complains of having a TIA today-states that she couldn't remember why she was in car -on assessment alert and oriented, denies pain. No neuro deficits.   Allergies No Known Allergies  Level of Care/Admitting Diagnosis ED Disposition     ED Disposition  Admit   Condition  --   Comment  Hospital Area: [facility] [[id number]]  Level of Care: Telemetry Medical [104]  May place patient in observation at [facility] or [facility] if equivalent level of care is available:: No  Covid Evaluation: Asymptomatic - no recent exposure (last 10 days) testing not required  Diagnosis: Acute ischemic stroke [facility]) [[id number]]  Admitting Physician: Rhetta Mura [[id number]]  Attending Physician: Rhetta Mura [[id number]]          B Medical/Surgery History Past Medical History:  Diagnosis Date   Anemia    "several times over the years" (10/17/2012)   Anxiety    Breast cancer ([place]) 02/2010   right breast   Carcinoma of breast treated with adjuvant hormone therapy ([place]) 9/11   Femara   CHF (congestive heart failure) (HCC)    COPD (chronic obstructive pulmonary disease) ([place])    Depression    Exertional shortness of breath    "recently" (10/17/2012)   GERD (gastroesophageal reflux disease)    diet controlled, no med   Hollenhorst plaque, left eye    apparently resolved at recent ophthalmology visit   Hyperlipidemia    Hypertension    IDC, RightStage II, 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 SN: [id number];  Surgeon: Prescott Gum, Collier Salina, MD;  Location: [facility];  Service: Open Heart Surgery;  Laterality: N/A;   APPENDECTOMY  1974   BREAST BIOPSY Right 02/27/10   Needle core Biopsy; Invasive Mammary; ER/PR Positive, Her-2 Neu negative, Ki-67 22%   BREAST LUMPECTOMY WITH SENTINEL LYMPH NODE BIOPSY Right 07/03/11   Invasive Ductal Carcinoma;0/3 nodes negative,; ER,PR Positive, Her-2 Neg; Ki-67 22%   CESAREAN SECTION  1970; 1974   COLONOSCOPY     DILATION AND CURETTAGE OF UTERUS  1970's   RIGHT/LEFT HEART CATH AND CORONARY ANGIOGRAPHY N/A 01/02/2019   Procedure: RIGHT/LEFT HEART CATH AND CORONARY ANGIOGRAPHY;  Surgeon: Larey Dresser, MD;  Location: [facility];  Service: Cardiovascular;  Laterality: N/A;   TEE WITHOUT CARDIOVERSION N/A 01/31/2019   Procedure: TRANSESOPHAGEAL ECHOCARDIOGRAM (TEE);  Surgeon: Prescott Gum, Collier Salina, MD;  Location: [facility];  Service: Open Heart Surgery;  Laterality: N/A;   TUBAL LIGATION  1974   WISDOM TOOTH EXTRACTION       A IV Location/Drains/Wounds Patient Lines/Drains/Airways Status     Active Line/Drains/Airways     Name Placement date Placement time Site Days   Peripheral IV 12/20/21 20 G Right Antecubital 12/20/21  1827  Antecubital  less than 1   Incision 07/03/11 Breast Right 07/03/11  1432  -- 3823   Incision 07/03/11 Axilla Right 07/03/11  1432  -- 3823   Incision (Closed) 01/31/19 Chest Other (Comment) 01/31/19  1148  -- 1054            Intake/Output Last 24 hours No intake or output data in the 24 hours ending 12/20/21 1928  Labs/Imaging Results for orders placed or performed during the hospital encounter of 12/20/21 (from the past 48 hour(s))  Protime-INR     Status: Abnormal    Collection Time: 12/20/21  2:36 PM  Result Value Ref Range   Prothrombin Time 16.7 (H) 11.4 - 15.2 seconds   INR 1.4 (H) 0.8 - 1.2    Comment: (NOTE) INR goal varies based on device and disease states. Performed at [facility], [place] [address] [place], [place] [address]   APTT     Status: None   Collection Time: 12/20/21  2:36 PM  Result Value Ref Range   aPTT 30 24 - 36 seconds    Comment: Performed at [facility] [address] [place], [place] [address]  CBC     Status: None   Collection Time: 12/20/21  2:36 PM  Result Value Ref Range   WBC 5.2 4.0 - 10.5 K/uL   RBC 4.06 3.87 - 5.11 MIL/uL   Hemoglobin 12.7 12.0 - 15.0 g/dL   HCT 38.6 36.0 - 46.0 %   MCV 95.1 80.0 - 100.0 fL   MCH 31.3 26.0 - 34.0 pg   MCHC 32.9 30.0 - 36.0 g/dL   RDW 14.6 11.5 - 15.5 %   Platelets 205 150 - 400 K/uL   nRBC 0.0 0.0 - 0.2 %    Comment: Performed at [facility], [place] [address], [place], [place] [address]  Differential     Status: None   Collection Time: 12/20/21  2:36 PM  Result Value Ref Range   Neutrophils Relative % 69 %   Neutro Abs 3.6 1.7 - 7.7 K/uL   Lymphocytes Relative 18 %   Lymphs Abs 0.9 0.7 - 4.0 K/uL   Monocytes Relative 8 %   Monocytes Absolute 0.4 0.1 - 1.0 K/uL   Eosinophils Relative 3 %   Eosinophils Absolute 0.2 0.0 - 0.5 K/uL   Basophils Relative 1 %   Basophils Absolute 0.0 0.0 - 0.1 K/uL   Immature Granulocytes 1 %   Abs Immature Granulocytes 0.03 0.00 - 0.07 K/uL    Comment: Performed at [facility] [address] [place], [place] [address]  Comprehensive metabolic panel     Status: Abnormal   Collection Time: 12/20/21  2:36 PM  Result Value Ref Range   Sodium 141 135 - 145 mmol/L   Potassium 4.4 3.5 - 5.1 mmol/L   Chloride 110 98 - 111 mmol/L   CO2 23 22 - 32 mmol/L   Glucose, Bld 111 (H) 70 - 99 mg/dL    Comment: Glucose reference range applies only to samples taken after fasting for at least 8 hours.   BUN 14 8 - 23 mg/dL    Creatinine, Ser 1.00 0.44 - 1.00 mg/dL   Calcium 9.4 8.9 - 10.3 mg/dL   Total Protein 6.7 6.5 - 8.1 g/dL   Albumin 4.2 3.5 - 5.0 g/dL   AST 17 15 - 41 U/L   ALT 14 0 - 44 U/L   Alkaline Phosphatase 33 (L)  38 - 126 U/L   Total Bilirubin 0.8 0.3 - 1.2 mg/dL   GFR, Estimated 57 (L) >60 mL/min    Comment: (NOTE) Calculated using the CKD-EPI Creatinine Equation (2021)    Anion gap 8 5 - 15    Comment: Performed at [facility] [address] [place], [place] [address]  Ethanol     Status: None   Collection Time: 12/20/21  2:36 PM  Result Value Ref Range   Alcohol, Ethyl (B) <10 <10 mg/dL    Comment: (NOTE) Lowest detectable limit for serum alcohol is 10 mg/dL.  For medical purposes only. Performed at [facility], [place] [address] [place], [place] [address]   Troponin I (High Sensitivity)     Status: None   Collection Time: 12/20/21  2:49 PM  Result Value Ref Range   Troponin I (High Sensitivity) 7 <18 ng/L    Comment: (NOTE) Elevated high sensitivity troponin I (hsTnI) values and significant  changes across serial measurements may suggest ACS but many other  chronic and acute conditions are known to elevate hsTnI results.  Refer to the "Links" section for chest pain algorithms and additional  guidance. Performed at [facility], [place] [address], [place], [place] [address]   I-stat chem 8, ED     Status: Abnormal   Collection Time: 12/20/21  2:50 PM  Result Value Ref Range   Sodium 141 135 - 145 mmol/L   Potassium 4.3 3.5 - 5.1 mmol/L   Chloride 108 98 - 111 mmol/L   BUN 16 8 - 23 mg/dL   Creatinine, Ser 1.00 0.44 - 1.00 mg/dL   Glucose, Bld 105 (H) 70 - 99 mg/dL    Comment: Glucose reference range applies only to samples taken after fasting for at least 8 hours.   Calcium, Ion 1.20 1.15 - 1.40 mmol/L   TCO2 22 22 - 32 mmol/L   Hemoglobin 12.2 12.0 - 15.0 g/dL   HCT 36.0 36.0 - 46.0 %  Urinalysis, Routine w reflex microscopic     Status: None    Collection Time: 12/20/21  3:56 PM  Result Value Ref Range   Color, Urine YELLOW YELLOW   APPearance CLEAR CLEAR   Specific Gravity, Urine 1.009 1.005 - 1.030   pH 6.0 5.0 - 8.0   Glucose, UA NEGATIVE NEGATIVE mg/dL   Hgb urine dipstick NEGATIVE NEGATIVE   Bilirubin Urine NEGATIVE NEGATIVE   Ketones, ur NEGATIVE NEGATIVE mg/dL   Protein, ur NEGATIVE NEGATIVE mg/dL   Nitrite NEGATIVE NEGATIVE   Leukocytes,Ua NEGATIVE NEGATIVE    Comment: Performed at [facility] [address] [place], [place] [address]   MR BRAIN WO CONTRAST  Result Date: 12/20/2021 CLINICAL DATA:  TIA. Lightheadedness and sudden amnesia. Question cerebellar lesion. EXAM: MRI HEAD WITHOUT CONTRAST TECHNIQUE: Multiplanar, multiecho pulse sequences of the brain and surrounding structures were obtained without intravenous contrast. COMPARISON:  MR head without contrast 02/27/2014. FINDINGS: Brain: Acute nonhemorrhagic infarct is present in the left occipital lobe measuring up to 5 mm. No other acute infarcts are present. Mild atrophy is progressed. Moderate diffuse periventricular and subcortical T2 hyperintensities has progressed as well. White matter changes extend into the anterior limb of the internal capsule. Basal ganglia are intact. Thalami are unremarkable. Progressive white matter changes extend into the brainstem. Cerebellum is unremarkable. The internal auditory canals are within normal limits. Vascular: Flow is present in the major intracranial arteries. Skull and upper cervical spine: The craniocervical  junction is normal. Upper cervical spine is within normal limits. Marrow signal is unremarkable. Sinuses/Orbits: The paranasal sinuses and mastoid air cells are clear. The globes and orbits are within normal limits. IMPRESSION: 1. Acute nonhemorrhagic infarct of the left occipital lobe measures up to 5 mm. 2. Progressive atrophy and diffuse white matter disease. This likely reflects the sequela of chronic  microvascular ischemia. Electronically Signed   By: San Morelle M.D.   On: 12/20/2021 17:34    Pending Labs Unresulted Labs (From admission, onward)     Start     Ordered   12/21/21 0500  Lipid panel  (Labs)  Tomorrow morning,   R       Comments: Fasting    12/20/21 1923   12/21/21 0500  CBC with Differential/Platelet  Tomorrow morning,   R        12/20/21 1924   12/21/21 0500  Comprehensive metabolic panel  Tomorrow morning,   R        12/20/21 1924   12/21/21 0500  Magnesium  Tomorrow morning,   R        12/20/21 1924   12/20/21 1925  Magnesium  Add-on,   AD        12/20/21 1924   12/20/21 1923  Hemoglobin A1c  (Labs)  Add-on,   AD       Comments: To assess prior glycemic control    12/20/21 1923            Vitals/Pain Today's Vitals   12/20/21 1600 12/20/21 1825 12/20/21 1826 12/20/21 1830  BP: (!) 173/96 (!) 148/80  (!) 132/94  Pulse: 70 74 74 72  Resp: _0 Temp:      TempSrc:      SpO2: 99% 99%  92%  PainSc:        Isolation Precautions No active isolations  Medications Medications  aspirin EC tablet 81 mg (has no administration in time range)  acetaminophen (TYLENOL) tablet 650 mg (has no administration in time range)    Or  acetaminophen (TYLENOL) suppository 650 mg (has no administration in time range)   stroke: early stages of recovery book (has no administration in time range)  sodium chloride flush (NS) 0.9 % injection 3 mL (3 mLs Intravenous Given 12/20/21 1827)    Mobility walks Low fall risk   Focused Assessments Neuro Assessment Handoff:  Swallow screen pass? Yes    NIH Stroke Scale ( + Modified Stroke Scale Criteria)  LOC Questions (1b. )   +: Answers both questions correctly LOC Commands (1c. )   + : Performs both tasks correctly Best Gaze (2. )  +: Normal Visual (3. )  +: No visual loss Motor Arm, Left (5a. )   +: No drift Motor Arm, Right (5b. )   +: No drift Motor Leg, Left (6a. )   +: No drift Motor Leg, Right  (6b. )   +: No drift Sensory (8. )   +: Normal, no sensory loss Best Language (9. )   +: No aphasia Extinction/Inattention (11.)   +: No Abnormality Modified SS Total  +: 0     Neuro Assessment:   Neuro Checks:      Last Documented NIHSS Modified Score: 0 (12/20/21 1828) Has TPA been given? No If patient is a Neuro Trauma and patient is going to OR before floor call report to [facility] nurse: [phone] or [phone]   R Recommendations: See Admitting Provider Note  Report  given to:   Additional Notes:

## 2021-12-20 NOTE — ED Notes (Signed)
Patient transported to CT 

## 2021-12-20 NOTE — Assessment & Plan Note (Signed)
  #)   Hyperlipidemia: documented h/o such. On atorvastatin 20 mg p.o. daily as outpatient.    Plan: continue home statin for now.  Follow-up result of lipid panel which has been ordered as Anabella Capshaw component of evaluation modifiable acute ischemic CVA risk factors in the setting of presenting acute ischemic occipital CVA.

## 2021-12-20 NOTE — Assessment & Plan Note (Signed)
  #)   Depression: Documented history of such, on Zoloft outpatient.  Of note, presenting EKG shows no evidence of QTc prolongation.  Plan: Continue home Zoloft.

## 2021-12-20 NOTE — Assessment & Plan Note (Addendum)
No evidence of exacerbation Continue home meds

## 2021-12-20 NOTE — Consult Note (Addendum)
Neurology Consultation  Reason for Consult: Strokelike symptoms, stroke on MRI Referring Physician: Dr. Octaviano Glow  CC: Dizziness, confusion lasting for a few minutes  History is obtained from: Patient, chart  HPI: Dana Pruitt is a 81 y.o. female past medical history of arctic stenosis with arctic valve repair, paroxysmal atrial fibrillation on Eliquis, hypertension, hyperlipidemia, COPD, CHF, breast cancer, anxiety, history of having had Hollenhorst plaque in the left eye with no residual deficits presenting to the emergency room after she had an episode of lightheadedness and feeling out of sorts while shopping this afternoon.  Last known well was around 12 PM.  Symptoms lasted for a few minutes but she continued to be somewhat lightheaded after that. She is compliant with her medications. No speech deficits No headaches She reports neck pain and pain in the left chest and left shoulder that has been ongoing for the past few weeks for which she has a thoracic CT scan scheduled as an outpatient.  As part of work-up for presentation, imaging was obtained and the MRI brain came out positive for small occipital stroke for which neurological consultation obtained.  She denies any visual symptoms  LKW: 12 PM IV thrombolysis given?: no, symptoms resolved Premorbid modified Rankin scale (mRS): 0  ROS: Full ROS was performed and is negative except as noted in the HPI. [name]   Past Medical History:  Diagnosis Date   Anemia    "several times over the years" (10/17/2012)   Anxiety    Breast cancer ([place]) 02/2010   right breast   Carcinoma of breast treated with adjuvant hormone therapy ([place]) 9/11   Femara   CHF (congestive heart failure) (HCC)    COPD (chronic obstructive pulmonary disease) (HCC)    Depression    Exertional shortness of breath    "recently" (10/17/2012)   GERD (gastroesophageal reflux disease)    diet controlled, no med   Hollenhorst plaque, left eye    apparently  resolved at recent ophthalmology visit   Hyperlipidemia    Hypertension    IDC, RightStage II, 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 Her smoking use included cigarettes. She started smoking about 63 years ago. She has a 90.00 pack-year smoking history. She has never used smokeless tobacco. She reports current alcohol use. She reports that she does not use drugs.  Medications  Current Facility-Administered Medications:     stroke: early stages of recovery book, , Does not apply, Once, Howerter, Justin B, DO   acetaminophen (TYLENOL) tablet 650 mg, 650 mg, Oral, Q6H PRN **OR** acetaminophen (TYLENOL) suppository 650 mg, 650 mg, Rectal, Q6H PRN, Howerter, Justin B, DO   aspirin EC tablet 81 mg, 81 mg, Oral, Daily, Trifan, Carola Rhine, MD  Current Outpatient Medications:    albuterol (PROAIR HFA) 108 (90 Base) MCG/ACT inhaler, 2 puffs every 4 hours as needed only  if your can't catch your breath, Disp: , Rfl:    apixaban (ELIQUIS) 5 MG TABS tablet, TAKE  1 TABLET(5 MG) BY MOUTH TWICE DAILY, Disp: 180 tablet, Rfl: 1   atorvastatin (LIPITOR) 20 MG tablet, Take 20 mg by mouth daily. , Disp: , Rfl:    carvedilol (COREG) 6.25 MG tablet, TAKE 1 AND 1/2 TABLETS(9.375 MG) BY MOUTH TWICE DAILY WITH A MEAL, Disp: 270 tablet, Rfl: 3   denosumab (PROLIA) 60 MG/ML SOSY injection, Inject 60 mg into the skin every 6 (six) months., Disp: , Rfl:    diltiazem (CARDIZEM) 30 MG tablet, Take 1 tablet every 4 hours AS NEEDED for AFIB heart rate >100, Disp: 45 tablet, Rfl: 1    fluticasone (FLONASE) 50 MCG/ACT nasal spray, Place 2 sprays into both nostrils daily., Disp: 16 g, Rfl: 3   Fluticasone-Umeclidin-Vilant (TRELEGY ELLIPTA) 100-62.5-25 MCG/ACT AEPB, Inhale 1 puff into the lungs daily., Disp: 60 each, Rfl: 11   guaiFENesin (MUCINEX) 600 MG 12 hr tablet, Take 1 tablet (600 mg total) by mouth 2 (two) times daily., Disp: 30 tablet, Rfl: 0   sertraline (ZOLOFT) 100 MG tablet, Take 100 mg by mouth daily., Disp: , Rfl:    Budeson-Glycopyrrol-Formoterol (BREZTRI AEROSPHERE) 160-9-4.8 MCG/ACT AERO, Inhale 2 puffs into the lungs 2 (two) times daily. (Patient not taking: Reported on 12/20/2021), Disp: 10.7 g, Rfl: 11   metoprolol tartrate (LOPRESSOR) 50 MG tablet, Take 1 tablet (50 mg total) by mouth once for 1 dose. Take 90 minutes to 2 hour prior to Scan, Disp: 1 tablet, Rfl: 0  Facility-Administered Medications Ordered in Other Encounters:    sodium chloride flush (NS) 0.9 % injection 3 mL, 3 mL, Intravenous, Q12H, Larey Dresser, MD  Exam: Current vital signs: BP (!) 132/94   Pulse 72   Temp 98.1 F (36.7 C) (Oral)   Resp 19   LMP  (LMP Unknown)   SpO2 92%  Vital signs in last 24 hours: Temp:  [98.1 F (36.7 C)] 98.1 F (36.7 C) (07/01 1423) Pulse Rate:  [62-74] 72 (07/01 1830) Resp:  [10-20] 19 (07/01 1830) BP: (132-181)/(80-105) 132/94 (07/01 1830) SpO2:  [91 %-99 %] 92 % (07/01 1830) General: Awake alert, somewhat anxious appearing woman in no acute distress HEENT: Normocephalic atraumatic CVS: Regular rate rhythm at this time on the monitor Respiratory: Occasional scattered rales at the bases Abdomen nondistended nontender Neurological exam Awake alert oriented x3 No dysarthria No aphasia Cranial nerves II to XII intact Motor examination with no drift in any of the 4 extremities Sensation intact to light touch Coordination exam with no dysmetria Gait testing deferred at this time NIH stroke scale-0.   Labs I have reviewed labs in epic and  the results pertinent to this consultation are:  CBC    Component Value Date/Time   WBC 5.2 12/20/2021 1436   RBC 4.06 12/20/2021 1436   HGB 12.2 12/20/2021 1450   HGB 13.1 08/09/2020 1229   HGB 12.8 11/27/2014 1016   HCT 36.0 12/20/2021 1450   HCT 38.6 08/09/2020 1229   HCT 37.1 11/27/2014 1016   PLT 205 12/20/2021 1436   PLT 248 08/09/2020 1229   MCV 95.1 12/20/2021 1436   MCV 94 08/09/2020 1229   MCV 92.8 11/27/2014 1016   MCH 31.3 12/20/2021 1436   MCHC 32.9 12/20/2021 1436   RDW 14.6 12/20/2021 1436   RDW 13.7 08/09/2020 1229   RDW 12.9 11/27/2014 1016   LYMPHSABS 0.9 12/20/2021 1436   LYMPHSABS 1.4 11/27/2014 1016   MONOABS 0.4 12/20/2021 1436   MONOABS 0.4 11/27/2014 1016   EOSABS 0.2 12/20/2021 1436  EOSABS 0.2 11/27/2014 1016   BASOSABS 0.0 12/20/2021 1436   BASOSABS 0.0 11/27/2014 1016    CMP     Component Value Date/Time   NA 141 12/20/2021 1450   NA 139 12/11/2021 1003   NA 138 11/27/2014 1017   K 4.3 12/20/2021 1450   K 4.9 11/27/2014 1017   CL 108 12/20/2021 1450   CL 105 03/31/2012 1410   CO2 23 12/20/2021 1436   CO2 25 11/27/2014 1017   GLUCOSE 105 (H) 12/20/2021 1450   GLUCOSE 108 11/27/2014 1017   GLUCOSE 97 03/31/2012 1410   BUN 16 12/20/2021 1450   BUN 15 12/11/2021 1003   BUN 18.7 11/27/2014 1017   CREATININE 1.00 12/20/2021 1450   CREATININE 1.0 11/27/2014 1017   CALCIUM 9.4 12/20/2021 1436   CALCIUM 10.1 11/27/2014 1017   PROT 6.7 12/20/2021 1436   PROT 6.4 11/27/2014 1017   ALBUMIN 4.2 12/20/2021 1436   ALBUMIN 4.0 11/27/2014 1017   AST 17 12/20/2021 1436   AST 19 11/27/2014 1017   ALT 14 12/20/2021 1436   ALT 12 11/27/2014 1017   ALKPHOS 33 (L) 12/20/2021 1436   ALKPHOS 35 (L) 11/27/2014 1017   BILITOT 0.8 12/20/2021 1436   BILITOT 0.45 11/27/2014 1017   GFRNONAA 57 (L) 12/20/2021 1436   GFRAA 48 (L) 03/13/2020 1504   Imaging I have reviewed the images obtained:  MRI examination of the brain small left occipital acute  infarct  Assessment:  81 year old with above past medical history with a brief transient episode of lightheadedness and confusion who presented for evaluation of her symptoms to the emergency room and work-up included an MRI that showed a left occipital area of acute infarction. Her prior medical history includes arctic stenosis status post AVR, hypertension, hyperlipidemia, paroxysmal atrial fibrillation on Eliquis-compliant to medications. Likely etiology of the stroke is cardioembolic but has has a history of Hollenhorst plaque in the eye so I would suspect there is some amount of underlying carotid disease. Needs further investigation to look for large vessel etiology versus Cardiologic etiology of the stroke.  Impression: Acute ischemic stroke-embolic versus large vessel  Recommendations: Admit to hospital Frequent neurochecks Hold Eliquis Aspirin 81 for now CTA head and neck 2D echo A1c Lipid panel Permissive hypertension for the next 24 to 48 hours-treat as needed only if systolic is greater than 932. Goal blood pressure discharge normotension. PT OT Speech therapy I would recommend resuming Eliquis in a couple of days given the small stroke size-we will defer final decision after work-up completion and stroke team rounding. Plan discussed with ED provider Dr. Langston Masker and the patient. Stroke team to follow  -- Amie Portland, MD Neurologist [facility] Pager: [phone]

## 2021-12-20 NOTE — Assessment & Plan Note (Signed)
? ? ? ?#)   Allergic Rhinitis: documented h/o such, on scheduled intranasal Flonase as outpatient.  ? ? ?Plan: cont home Flonase.  ? ? ?

## 2021-12-20 NOTE — Assessment & Plan Note (Signed)
  #)   Paroxysmal atrial fibrillation: Documented history of such. In setting of CHA2DS2-VASc score of  8, there is an indication for chronic anticoagulation for thromboembolic prophylaxis. Consistent with this, patient is chronically anticoagulated on Eliquis. Home AV nodal blocking regimen: Coreg.  She is also on diltiazem as an outpatient, but on Muriel Hannold as needed basis only, with indication for heart rate greater than 100 bpm.   most recent echocardiogram performed in April 2023 was notable for LVEF 55 to 76%, grade 1 diastolic dysfunction, mild mitral regurgitation, status post bioprosthetic aortic valve replacement with trace aortic regurgitation. Presenting EKG normal sinus rhythm without overt evidence of acute ischemic changes.   In the context of presenting acute ischemic stroke and ensuing formal neurology consult, neurology recommends holding on Eliquis for now while starting daily baby aspirin, with additional neurology recommendations regarding timing of resumption of Eliquis to follow.    Plan: monitor strict I's & O's and daily weights. Repeat BMP/CBC in AM. Check serum mag level. Continue home AV nodal blocking regimen.  Holding home Eliquis, per neurology, while following further into recommendations regarding timing of resumption of this medication.  Monitor on symmetry.

## 2021-12-20 NOTE — H&P (Signed)
History and Physical    PLEASE NOTE THAT DRAGON DICTATION SOFTWARE WAS USED IN THE CONSTRUCTION OF THIS NOTE.   Dana Pruitt MRN:[id number] DOB: 10/04/1940 DOA: 12/20/2021  PCP: Pharr, Walter, MD  Patient coming from: home   I have personally briefly reviewed patient's old medical records in [facility]  Chief Complaint: Amnesia  HPI: Dana Pruitt is a 80 y.o. female with medical history significant for paroxysmal atrial fibrillation chronically anticoagulated on Eliquis, aortic regurgitation and status post bioprosthetic aortic valve replacement in August 2020, hypertension, hyperlipidemia, chronic diastolic heart failure, COPD, allergic rhinitis, who is admitted to [facility] on 12/20/2021 with acute ischemic stroke after presenting from home to [facility] ED complaining of amnesia.   The patient conveys that she was in her normal state of health earlier today and went out shopping during which, at approximately noon on 12/20/2021, developed acute onset memory loss, with the patient specifically conveying that she acutely forgot where she was or what she was doing at the time.  She notes that this was associated with acute onset dizziness lightheadedness, but in the absence of a sensation that the room is spinning.  Denies any ensuing fall, presyncope, or syncope.  Denies any associated acute focal weakness, acute focal numbness, paresthesias, facial droop, slurred speech, expressive aphasia, acute change in vision, dysphagia.  She notes that the amnesia lasted for less than 30 minutes before completely resolving, without subsequent recurrence.  She is noted some interval improvement in the initial dizziness/lightheadedness, but continues to report some mild residual dizziness at this time.  She conveys that she has been feeling intermittently dizzy/lightheaded over the last 1 to 2 months, but notes that the intensity of her dizziness today was more significant, and that the episode of such  has been more prolonged than that which she has been experiencing intermittently over the last 1 to 2 months.  She also notes that the intermittent dizziness over the last 1 to 2 months has not been associated with any amnesia leading up to today.  Denies any associated chest pain, shortness of breath, palpitations, diaphoresis, nausea, vomiting.  Denies any known previous history of stroke.  Medical history notable for paroxysmal atrial fibrillation for which she reports good compliance on chronic anticoagulation via Eliquis, with most recent such dose occurring on the morning of 12/20/2021.  Otherwise, denies use of any additional antiplatelet medications at home, including no aspirin.  She also confirms bioprosthetic aortic valve replacement in August 2020 in the setting of moderate to severe aortic regurgitation.  Medical history also notable for hypertension, hyperlipidemia.  No known history of underlying diabetes or obstructive sleep apnea.  She informs that she is a former smoker, having completely quit smoking greater than 30 years ago while noting that she had smoked 3 packs/day for about 30 years leading up to definitively quitting in the early 1990s.  Confirms that she is on atorvastatin 20 mg p.o. daily.  Most recent echocardiogram occurred on 10/07/2021 and was notable for LVEF 55 to 60%, no focal wall motion normalities, mild LVH, grade 1 diastolic dysfunction, normal right ventricular systolic function, mild mitral regurgitation, also noting status post prosthetic aortic valve replacement with trace aortic regurgitation.      ED Course:  Vital signs in the ED were notable for the following: Afebrile; heart rate 72-81; initial blood pressure 181/105, spontaneously improving without any interval antihypertensive intervention, with most recent blood pressure noted to be 132/94; respiratory rate 16-20, oxygen saturation 93   to 99% on room air.  Labs were notable for the following: CMP  notable for creatinine 1.0, glucose 111, liver enzymes within normal limits.  Associated troponin I x1 noted to be 7.  CBC notable for will with cell count 5200, hemoglobin 12.5.  Urinalysis showed no white blood cells.  Serum ethanol level less than 10.  Imaging and additional notable ED work-up: EKG showed sinus rhythm with heart rate 64, normal intervals and no evidence of T wave or ST changes, including no evidence of ST elevation.  MRI brain without contrast showed acute nonhemorrhagic infarct of the left occipital lobe measuring up to 5 mm.  EDP discussed the patient's case with the on-call neurologist, Dr. Rory Percy, who recommended admission to the hospital service for further evaluation and management of acute ischemic stroke, including pursuit of CTA head and neck as well as evaluation for potentially modifiable acute ischemic CVA risk factors, in addition to initiation of of supportive services, including PT/OT consults.  Dr. Rory Percy also recommended holding of home Eliquis for now as well as initiation of daily baby aspirin, with additional recommendations regarding timing of resumption of Eliquis to follow.  While in the ED, the following were administered: Aspirin 81 mg p.o. x1.  Subsequently, the patient was admitted for overnight observation for further evaluation management of presenting acute ischemic stroke.     Review of Systems: As per HPI otherwise 10 point review of systems negative.   Past Medical History:  Diagnosis Date   Anemia    "several times over the years" (10/17/2012)   Anxiety    Breast cancer ([place]) 02/2010   right breast   Carcinoma of breast treated with adjuvant hormone therapy ([place]) 9/11   [place]   CHF (congestive heart failure) (HCC)    COPD (chronic obstructive pulmonary disease) (HCC)    Depression    Exertional shortness of breath    "recently" (10/17/2012)   GERD (gastroesophageal reflux disease)    diet controlled, no med   Hollenhorst plaque, left  eye    apparently resolved at recent ophthalmology visit   Hyperlipidemia    Hypertension    IDC, RightStage II, 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 SN: [id number];  Surgeon: Prescott Gum, Collier Salina, MD;  Location: [facility];  Service: Open Heart Surgery;  Laterality: N/A;   APPENDECTOMY  1974   BREAST BIOPSY Right 02/27/10   Needle core Biopsy; Invasive Mammary; ER/PR Positive, Her-2 Neu negative, Ki-67 22%   BREAST LUMPECTOMY WITH SENTINEL LYMPH NODE BIOPSY Right 07/03/11   Invasive Ductal Carcinoma;0/3 nodes negative,; ER,PR Positive, Her-2 Neg; Ki-67 22%   CESAREAN SECTION  1970; 1974   COLONOSCOPY     DILATION AND CURETTAGE OF UTERUS  1970's   RIGHT/LEFT HEART CATH AND CORONARY ANGIOGRAPHY N/A 01/02/2019   Procedure: RIGHT/LEFT HEART CATH AND CORONARY ANGIOGRAPHY;  Surgeon: Larey Dresser, MD;  Location: [facility];  Service: Cardiovascular;  Laterality: N/A;   TEE WITHOUT CARDIOVERSION N/A 01/31/2019   Procedure: TRANSESOPHAGEAL ECHOCARDIOGRAM (TEE);  Surgeon: Prescott Gum, Collier Salina, MD;  Location: [facility];  Service: Open Heart Surgery;  Laterality: N/A;   TUBAL LIGATION  1974  WISDOM TOOTH EXTRACTION      Social History:  reports that she quit smoking about 31 years ago. Her smoking use included cigarettes. She started smoking about 63 years ago. She has a 90.00 pack-year smoking history. She has never used smokeless tobacco. She reports current alcohol use. She reports that she does not use drugs.   No Known Allergies  Family History  Problem Relation Age of Onset   Heart disease Mother    Cancer Father    Heart disease Maternal Grandmother    Heart  disease Maternal Grandfather    Cancer Paternal Grandmother    Lung disease Neg Hx     Family history reviewed and not pertinent    Prior to Admission medications   Medication Sig Start Date End Date Taking? Authorizing Provider  albuterol (PROAIR HFA) 108 (90 Base) MCG/ACT inhaler 2 puffs every 4 hours as needed only  if your can't catch your breath 09/05/20  Yes Tanda Rockers, MD  apixaban (ELIQUIS) 5 MG TABS tablet TAKE 1 TABLET(5 MG) BY MOUTH TWICE DAILY 09/23/21  Yes Lorretta Harp, MD  atorvastatin (LIPITOR) 20 MG tablet Take 20 mg by mouth daily.  02/12/14  Yes [provider]  carvedilol (COREG) 6.25 MG tablet TAKE 1 AND 1/2 TABLETS(9.375 MG) BY MOUTH TWICE DAILY WITH A MEAL 07/03/21  Yes Lorretta Harp, MD  denosumab (PROLIA) 60 MG/ML SOSY injection Inject 60 mg into the skin every 6 (six) months.   Yes [provider]  diltiazem (CARDIZEM) 30 MG tablet Take 1 tablet every 4 hours AS NEEDED for AFIB heart rate >100 02/21/20  Yes Sherran Needs, NP  fluticasone (FLONASE) 50 MCG/ACT nasal spray Place 2 sprays into both nostrils daily. 12/17/21  Yes Tanda Rockers, MD  Fluticasone-Umeclidin-Vilant (TRELEGY ELLIPTA) 100-62.5-25 MCG/ACT AEPB Inhale 1 puff into the lungs daily. 10/03/21  Yes Tanda Rockers, MD  guaiFENesin (MUCINEX) 600 MG 12 hr tablet Take 1 tablet (600 mg total) by mouth 2 (two) times daily. 02/07/19  Yes Gold, Wayne E, PA-C  sertraline (ZOLOFT) 100 MG tablet Take 100 mg by mouth daily.   Yes [provider]  Budeson-Glycopyrrol-Formoterol (BREZTRI AEROSPHERE) 160-9-4.8 MCG/ACT AERO Inhale 2 puffs into the lungs 2 (two) times daily. Patient not taking: Reported on 12/20/2021 09/15/21   Tanda Rockers, MD  metoprolol tartrate (LOPRESSOR) 50 MG tablet Take 1 tablet (50 mg total) by mouth once for 1 dose. Take 90 minutes to 2 hour prior to Scan 12/11/21 12/11/21  Darreld Mclean, PA-C     Objective    Physical Exam: Vitals:   12/20/21 1825  12/20/21 1826 12/20/21 1830 12/20/21 1951  BP: (!) 148/80  (!) 132/94   Pulse: 74 74 72   Resp: _0 Temp:      TempSrc:      SpO2: 99%  92%   Weight:    55.3 kg  Height:    5' 4" (1.626 m)    General: appears to be stated age; alert, oriented Skin: warm, dry, no rash Head:  AT/[place] Mouth:  Oral mucosa membranes appear moist, normal dentition Neck: supple; trachea midline Heart:  RRR; did not appreciate any M/R/G Lungs: CTAB, did not appreciate any wheezes, rales, or rhonchi Abdomen: + BS; soft, ND, NT Vascular: 2+ pedal pulses b/l; 2+ radial pulses b/l Extremities: no peripheral edema, no muscle wasting Neuro:  5/5 strength of the proximal and distal flexors and extensors of the upper  and lower extremities bilaterally; sensation intact in upper and lower extremities b/l; cranial nerves II through XII grossly intact; no pronator drift; no evidence suggestive of slurred speech, dysarthria, or facial droop; Normal muscle tone. No tremors.    Labs on Admission: I have personally reviewed following labs and imaging studies  CBC: Recent Labs  Lab 12/20/21 1436 12/20/21 1450  WBC 5.2  --   NEUTROABS 3.6  --   HGB 12.7 12.2  HCT 38.6 36.0  MCV 95.1  --   PLT 205  --    Basic Metabolic Panel: Recent Labs  Lab 12/20/21 1436 12/20/21 1450  NA 141 141  K 4.4 4.3  CL 110 108  CO2 23  --   GLUCOSE 111* 105*  BUN 14 16  CREATININE 1.00 1.00  CALCIUM 9.4  --    GFR: Estimated Creatinine Clearance: 39.4 mL/min (by C-G formula based on SCr of 1 mg/dL). Liver Function Tests: Recent Labs  Lab 12/20/21 1436  AST 17  ALT 14  ALKPHOS 33*  BILITOT 0.8  PROT 6.7  ALBUMIN 4.2   No results for input(s): "LIPASE", "AMYLASE" in the last 168 hours. No results for input(s): "AMMONIA" in the last 168 hours. Coagulation Profile: Recent Labs  Lab 12/20/21 1436  INR 1.4*   Cardiac Enzymes: No results for input(s): "CKTOTAL", "CKMB", "CKMBINDEX", "TROPONINI" in the last  168 hours. BNP (last 3 results) No results for input(s): "PROBNP" in the last 8760 hours. HbA1C: No results for input(s): "HGBA1C" in the last 72 hours. CBG: No results for input(s): "GLUCAP" in the last 168 hours. Lipid Profile: No results for input(s): "CHOL", "HDL", "LDLCALC", "TRIG", "CHOLHDL", "LDLDIRECT" in the last 72 hours. Thyroid Function Tests: No results for input(s): "TSH", "T4TOTAL", "FREET4", "T3FREE", "THYROIDAB" in the last 72 hours. Anemia Panel: No results for input(s): "VITAMINB12", "FOLATE", "FERRITIN", "TIBC", "IRON", "RETICCTPCT" in the last 72 hours. Urine analysis:    Component Value Date/Time   COLORURINE YELLOW 12/20/2021 1556   APPEARANCEUR CLEAR 12/20/2021 1556   LABSPEC 1.009 12/20/2021 1556   PHURINE 6.0 12/20/2021 1556   GLUCOSEU NEGATIVE 12/20/2021 1556   HGBUR NEGATIVE 12/20/2021 1556   BILIRUBINUR NEGATIVE 12/20/2021 1556   KETONESUR NEGATIVE 12/20/2021 1556   PROTEINUR NEGATIVE 12/20/2021 1556   UROBILINOGEN 0.2 10/17/2012 1738   NITRITE NEGATIVE 12/20/2021 1556   LEUKOCYTESUR NEGATIVE 12/20/2021 1556    Radiological Exams on Admission: MR BRAIN WO CONTRAST  Result Date: 12/20/2021 CLINICAL DATA:  TIA. Lightheadedness and sudden amnesia. Question cerebellar lesion. EXAM: MRI HEAD WITHOUT CONTRAST TECHNIQUE: Multiplanar, multiecho pulse sequences of the brain and surrounding structures were obtained without intravenous contrast. COMPARISON:  MR head without contrast 02/27/2014. FINDINGS: Brain: Acute nonhemorrhagic infarct is present in the left occipital lobe measuring up to 5 mm. No other acute infarcts are present. Mild atrophy is progressed. Moderate diffuse periventricular and subcortical T2 hyperintensities has progressed as well. White matter changes extend into the anterior limb of the internal capsule. Basal ganglia are intact. Thalami are unremarkable. Progressive white matter changes extend into the brainstem. Cerebellum is unremarkable.  The internal auditory canals are within normal limits. Vascular: Flow is present in the major intracranial arteries. Skull and upper cervical spine: The craniocervical junction is normal. Upper cervical spine is within normal limits. Marrow signal is unremarkable. Sinuses/Orbits: The paranasal sinuses and mastoid air cells are clear. The globes and orbits are within normal limits. IMPRESSION: 1. Acute nonhemorrhagic infarct of the left occipital lobe measures up to 5 mm.  2. Progressive atrophy and diffuse white matter disease. This likely reflects the sequela of chronic microvascular ischemia. Electronically Signed   By: Christopher  Mattern M.D.   On: 12/20/2021 17:34     EKG: Independently reviewed, with result as described above.    Assessment/Plan   Principal Problem:   Acute ischemic stroke (HCC) Active Problems:   Depression   COPD  GOLD II   HLD (hyperlipidemia)   PAF (paroxysmal atrial fibrillation) (HCC)   Essential hypertension   Chronic diastolic CHF (congestive heart failure) (HCC)   Allergic rhinitis       #) Acute ischemic CVA of the left occipital lobe: suspected dx on the basis of acute onset of amnesia and dizziness/lightheadedness at noon on 12/20/2021, representing last known normal, with ensuing spontaneous resolution of her amnesia.  She notes interval improvement in her dizziness/lightheadedness, with very mild residual dizziness at this time in the absence of any additional acute focal neurologic deficits.  MRI brain shows acute nonhemorrhagic infarct of the left occipital lobe.   EDP discussed the patient's case with the on-call neurologist, Dr. Arora, who recommended admission to the hospital service for further evaluation and management of acute ischemic stroke, including pursuit of CTA head and neck as well as evaluation for potentially modifiable acute ischemic CVA risk factors, in addition to initiation of of supportive services, including PT/OT consults.  Dr.  Arora also recommended holding of home Eliquis for now as well as initiation of daily baby aspirin, with additional recommendations regarding timing of resumption of Eliquis to follow.  Of note, the patient says his multiple modifiable ischemic CVA risk factors, including a history of paroxysmal atrial fibrillation, reporting good compliance with chronic anticoagulation on Eliquis.  Presenting EKG shows normal sinus rhythm without overt evidence of acute ischemic changes.  Her modifiable acute ischemic CVA risk factors also include hypertension, hyperlipidemia, while denying any known history of underlying diabetes or obstructive sleep apnea.  She confirms that she has a former smoker in the setting of a 90-pack-year history, as further detailed above.  Aside from Eliquis, not on any additional blood thinners at home.  Outpatient antilipid regimen consists of atorvastatin 20 mg p.o. daily.  Dr. Arora confirms that the patient is not a candidate for tPA administration given the near complete spontaneous resolution of her presenting acute neurologic deficits.  Additionally, per neurology, will allow for permissive hypertension for 24-48 hours following onset of acute focal neurologic deficits, with associated parameters further quantified below.    Plan: Nursing bedside swallow evaluation x 1 now, and will not initiate oral medications or diet until the patient has passed this. Head of the bed at 30 degrees. Neuro checks per protocol. VS per protocol. Will allow for permissive hypertension for 24-48 hours following onset of acute focal neurologic deficits at noon on 12/20/2021, during which will hold home antihypertensive medications, with prn IV labetalol ordered for systolic blood pressure greater than 220 mmHg or diastolic blood pressure greater than 120 mmHg. Monitor on telemetry.  CTA head without contrast, CTA neck with contrast.  Echocardiogram without bubble study is ordered for the morning. Check lipid  panel and A1c. PT/OT consults have been ordered to occur in the morning.  Neurology formally consulted.  Hold home Eliquis for now and start daily baby aspirin, per their additional recommendations, as above.           #) Paroxysmal atrial fibrillation: Documented history of such. In setting of CHA2DS2-VASc score of  8, there is an   indication for chronic anticoagulation for thromboembolic prophylaxis. Consistent with this, patient is chronically anticoagulated on Eliquis. Home AV nodal blocking regimen: Coreg.  She is also on diltiazem as an outpatient, but on a as needed basis only, with indication for heart rate greater than 100 bpm.   most recent echocardiogram performed in April 2023 was notable for LVEF 55 to 03%, grade 1 diastolic dysfunction, mild mitral regurgitation, status post bioprosthetic aortic valve replacement with trace aortic regurgitation. Presenting EKG normal sinus rhythm without overt evidence of acute ischemic changes.   In the context of presenting acute ischemic stroke and ensuing formal neurology consult, neurology recommends holding on Eliquis for now while starting daily baby aspirin, with additional neurology recommendations regarding timing of resumption of Eliquis to follow.    Plan: monitor strict I's & O's and daily weights. Repeat BMP/CBC in AM. Check serum mag level. Continue home AV nodal blocking regimen.  Holding home Eliquis, per neurology, while following further into recommendations regarding timing of resumption of this medication.  Monitor on symmetry.              #) Chronic diastolic heart failure: documented history of such, with most recent echocardiogram performed in April 2023, as further noted above. No clinical evidence to suggest acutely decompensated heart failure at this time. home diuretic regimen reportedly consists of the following: None.   Plan: monitor strict I's & O's and daily weights. Repeat BMP in AM. Check serum mag  level.  We will follow for result updated echocardiogram as component to evaluation of presenting acute ischemic stroke.           #) Depression: Documented history of such, on Zoloft outpatient.  Of note, presenting EKG shows no evidence of QTc prolongation.  Plan: Continue home Zoloft.            #) COPD: Documented history of syndrome in the setting of a prior 90-pack-year smoking history leading up to completely quitting greater than 30 years ago.  No evidence of acute exacerbation at this time.  Outpatient respiratory regimen includes Trelegy Ellipta as well as as needed albuterol inhaler.  Plan: Continue outpatient Trelegy Ellipta.  As needed albuterol nebulizer ordered.          #) Essential Hypertension: documented h/o such, with outpatient antihypertensive regimen including Coreg.  SBP's in the ED today: Initially in the 180s, with spontaneous improvement into the 130s without interval antihypertensive intervention.  In the context of presenting acute ischemic stroke is confirmed on MRI brain, will observe permissive hypertension for 24 to 48 hours following onset of acute focal neurologic deficits, with last known normal noted to be noon on 12/20/2021.   Plan: Close monitoring of subsequent BP via routine VS. holding home Coreg for now and observance of permissive hypertension, as above, with as needed IV labetalol ordered with parameters as defined above.         #) Hyperlipidemia: documented h/o such. On atorvastatin 20 mg p.o. daily as outpatient.    Plan: continue home statin for now.  Follow-up result of lipid panel which has been ordered as a component of evaluation modifiable acute ischemic CVA risk factors in the setting of presenting acute ischemic occipital CVA.          #) Allergic Rhinitis: documented h/o such, on scheduled intranasal Flonase as outpatient.    Plan: cont home Flonase.       DVT prophylaxis: SCD's   Code  Status: Full code Family Communication: none Disposition  Plan: Per Rounding Team Consults called: Neurology (Dr. Arora) has been consulted, as further detailed above;  Admission status: Observation;    PLEASE NOTE THAT DRAGON DICTATION SOFTWARE WAS USED IN THE CONSTRUCTION OF THIS NOTE.   Justin B Howerter DO [facility]  From 7PM - 7AM   12/20/2021, 8:24 PM     

## 2021-12-21 ENCOUNTER — Observation Stay (HOSPITAL_BASED_OUTPATIENT_CLINIC_OR_DEPARTMENT_OTHER): Payer: Medicare Other

## 2021-12-21 DIAGNOSIS — I6389 Other cerebral infarction: Secondary | ICD-10-CM | POA: Diagnosis not present

## 2021-12-21 DIAGNOSIS — I639 Cerebral infarction, unspecified: Secondary | ICD-10-CM | POA: Diagnosis not present

## 2021-12-21 LAB — COMPREHENSIVE METABOLIC PANEL
ALT: 12 U/L (ref 0–44)
AST: 13 U/L — ABNORMAL LOW (ref 15–41)
Albumin: 3.6 g/dL (ref 3.5–5.0)
Alkaline Phosphatase: 30 U/L — ABNORMAL LOW (ref 38–126)
Anion gap: 7 (ref 5–15)
BUN: 11 mg/dL (ref 8–23)
CO2: 22 mmol/L (ref 22–32)
Calcium: 9 mg/dL (ref 8.9–10.3)
Chloride: 109 mmol/L (ref 98–111)
Creatinine, Ser: 0.96 mg/dL (ref 0.44–1.00)
GFR, Estimated: >60 mL/min (ref >60)
Glucose, Bld: 107 mg/dL — ABNORMAL HIGH (ref 70–99)
Potassium: 3.5 mmol/L (ref 3.5–5.1)
Sodium: 138 mmol/L (ref 135–145)
Total Bilirubin: 0.7 mg/dL (ref 0.3–1.2)
Total Protein: 5.7 g/dL — ABNORMAL LOW (ref 6.5–8.1)

## 2021-12-21 LAB — ECHOCARDIOGRAM COMPLETE
AR max vel: 1.07 cm2
AV Area VTI: 0.99 cm2
AV Area mean vel: 0.96 cm2
AV Mean grad: 13 mmHg
AV Peak grad: 18.7 mmHg
Ao pk vel: 2.16 m/s
Area-P 1/2: 2.69 cm2
Height: 64 in
S' Lateral: 1.9 cm
Weight: 1950.63 oz

## 2021-12-21 LAB — CBC WITH DIFFERENTIAL/PLATELET
Abs Immature Granulocytes: 0.03 10*3/uL (ref 0.00–0.07)
Basophils Absolute: 0.1 10*3/uL (ref 0.0–0.1)
Basophils Relative: 1 %
Eosinophils Absolute: 0.2 10*3/uL (ref 0.0–0.5)
Eosinophils Relative: 4 %
HCT: 35 % — ABNORMAL LOW (ref 36.0–46.0)
Hemoglobin: 11.8 g/dL — ABNORMAL LOW (ref 12.0–15.0)
Immature Granulocytes: 1 %
Lymphocytes Relative: 25 %
Lymphs Abs: 1.1 10*3/uL (ref 0.7–4.0)
MCH: 30.9 pg (ref 26.0–34.0)
MCHC: 33.7 g/dL (ref 30.0–36.0)
MCV: 91.6 fL (ref 80.0–100.0)
Monocytes Absolute: 0.5 10*3/uL (ref 0.1–1.0)
Monocytes Relative: 10 %
Neutro Abs: 2.7 10*3/uL (ref 1.7–7.7)
Neutrophils Relative %: 59 %
Platelets: 197 10*3/uL (ref 150–400)
RBC: 3.82 MIL/uL — ABNORMAL LOW (ref 3.87–5.11)
RDW: 14.4 % (ref 11.5–15.5)
WBC: 4.5 10*3/uL (ref 4.0–10.5)
nRBC: 0 % (ref 0.0–0.2)

## 2021-12-21 LAB — LIPID PANEL
Cholesterol: 125 mg/dL (ref 0–200)
HDL: 41 mg/dL (ref >40)
LDL Cholesterol: 69 mg/dL (ref 0–99)
Total CHOL/HDL Ratio: 3 RATIO
Triglycerides: 73 mg/dL (ref <150)
VLDL: 15 mg/dL (ref 0–40)

## 2021-12-21 LAB — MAGNESIUM: Magnesium: 2 mg/dL (ref 1.7–2.4)

## 2021-12-21 MED ORDER — APIXABAN 5 MG PO TABS: 5.0000 mg | ORAL_TABLET | Freq: Two times a day (BID) | ORAL | Status: DC

## 2021-12-21 NOTE — Progress Notes (Signed)
Echocardiogram 2D Echocardiogram has been performed.  Oneal Deputy Liisa Picone RDCS 12/21/2021, 12:09 PM

## 2021-12-21 NOTE — Progress Notes (Signed)
Patient discharged home via private vehicle with friend. PIV removed. Discharge instructions explained and patient verbalized understanding. All belongings sent home with patient.

## 2021-12-21 NOTE — Discharge Summary (Signed)
Physician Discharge Summary  Dana Pruitt PJK:[id number] DOB: 1940-10-08 DOA: 12/20/2021  PCP: Dana Pretty, MD  Admit date: 12/20/2021 Discharge date: 12/21/2021  Time spent: 40 minutes  Recommendations for Outpatient Follow-up:  Follow outpatient CBC/CMP  Follow with neurology outpatient   Needs outpatient sleep study Follow prediabetes outpatient   Discharge Diagnoses:  Principal Problem:   Acute ischemic stroke Dana Pruitt) Active Problems:   PAF (paroxysmal atrial fibrillation) (HCC)   Chronic diastolic CHF (congestive heart failure) ([place])   COPD  GOLD II   Depression   HLD (hyperlipidemia)   Essential hypertension   Allergic rhinitis  Discharge Condition: stable  Diet recommendation: heart healthy  Filed Weights   12/20/21 1951 12/21/21 0500  Weight: 55.3 kg 55.3 kg    History of present illness:   Dana Pruitt is Dana Pruitt 81 y.o. female with medical history significant for paroxysmal atrial fibrillation chronically anticoagulated on Eliquis, aortic regurgitation and status post bioprosthetic aortic valve replacement in August 2020, hypertension, hyperlipidemia, chronic diastolic heart failure, COPD, allergic rhinitis, who is admitted to [facility] on 12/20/2021 with acute ischemic stroke after presenting from home to [facility] ED complaining of amnesia.   MRI with acute nonhemorrhagic infarct of Dana L occipital lobe.  Stroke workup complete, recommending continuing eliquis and outpatient sleep study.    See below for additional details   Pruitt Course:  Assessment and Plan: * Acute ischemic stroke Dana Pruitt) Presented with confusion, memory loss, LH/dizziness Now resolved MRI with acute nonhemorrhagic infarct of Dana L occipital lobe measures up to 5 mm, progressive atrophy and diffuse white matter disease (likely sequela of chronic microvascular ischemia) CTA head/neck negative for LVO, intracranial atherosclerotic disease associated with mild to moderate proximal L M2/M3  stenoses, otherwise relatively mild for age atheromatous changes seen elsewhere about Dana major arterial vasculature of Dana head and neck, diffuse tortuosity of Dana major arterial vasculature of Dana neck suggesting chronic underlying hypertension lDL 69, A1c 5.9 Echo with EF 60-65%, s/p AVR Appreciate neurology recs, thought cardioembolic vs large vessel atherosclerosis.  Recommending continuing eliquis after discharge.  Neurology referral outpatient.   PAF (paroxysmal atrial fibrillation) (HCC) Continue eliquis, coreg   Chronic diastolic CHF (congestive heart failure) (HCC) Echo with normal EF, s/p AVR, grade 1 diastolic dysfunction Coreg Not on any diuretics, appears euvolemic   COPD  GOLD II No evidence of exacerbation Continue home meds   Essential hypertension coreg   HLD (hyperlipidemia) lipitor LDL 69    Depression zoloft     Allergic rhinitis home Flonase.       Procedures: Echo IMPRESSIONS     1. Normal LV function; s/p AVR with mean gradient 13 mmHg unchanged  compared to previous.   2. Left ventricular ejection fraction, by estimation, is 60 to 65%. Dana  left ventricle has normal function. Dana left ventricle has no regional  wall motion abnormalities. Left ventricular diastolic parameters are  consistent with Grade I diastolic  dysfunction (impaired relaxation).   3. Right ventricular systolic function is normal. Dana right ventricular  size is normal. There is normal pulmonary artery systolic pressure.   4. Dana mitral valve is normal in structure. Trivial mitral valve  regurgitation. No evidence of mitral stenosis.   5. Dana aortic valve has been repaired/replaced. Aortic valve  regurgitation is not visualized. No aortic stenosis is present. There is Dana Pruitt  21 mm Dana Pruitt valve present in Dana aortic position.  Procedure Date: 01/31/19.   6. There is mild dilatation of  Dana ascending aorta, measuring 43 mm.   7. Dana inferior vena  cava is normal in size with greater than 50%  respiratory variability, suggesting right atrial pressure of 3 mmHg.   Consultations: neurology  Discharge Exam: Vitals:   12/20/21 2304 12/21/21 0406  BP: (!) 154/90 (!) 153/84  Pulse: 65 67  Resp: 18 18  Temp: 98 F (36.7 C) 98 F (36.7 C)  SpO2: 93% 95%   Eager to discharge home Feels back to herself  General: No acute distress. Cardiovascular: RRR Lungs: unlabored Abdomen: Soft, nontender, nondistended  Neurological: Alert and oriented 3. Moves all extremities 4. Cranial nerves II through XII intact. Skin: Warm and dry. No rashes or lesions. Extremities: No clubbing or cyanosis. No edema.   Discharge Instructions   Discharge Instructions     Ambulatory referral to Neurology   Complete by: As directed    An appointment is requested in approximately: 4 weeks   Call MD for:  difficulty breathing, headache or visual disturbances   Complete by: As directed    Call MD for:  extreme fatigue   Complete by: As directed    Call MD for:  hives   Complete by: As directed    Call MD for:  persistant dizziness or light-headedness   Complete by: As directed    Call MD for:  persistant nausea and vomiting   Complete by: As directed    Call MD for:  redness, tenderness, or signs of infection (pain, swelling, redness, odor or green/yellow discharge around incision site)   Complete by: As directed    Call MD for:  severe uncontrolled pain   Complete by: As directed    Call MD for:  temperature >100.4   Complete by: As directed    Diet - low sodium heart healthy   Complete by: As directed    Discharge instructions   Complete by: As directed    You were seen for Dana Pruitt stroke.  You've improved back to your baseline.  Continue your eliquis and your lipitor.  Follow with neurology outpatient.  Please ask your outpatient doctor about Dana Pruitt referral for Dana Pruitt sleep study.    Your A1c is 5.9, this is in Dana prediabetes range.  Please follow  up with your PCP as an outpatient.  Return for new, recurrent, or worsening symptoms.  Please ask your PCP to request records from this hospitalization so they know what was done and what Dana next steps will be.   Increase activity slowly   Complete by: As directed       Allergies as of 12/21/2021   No Known Allergies      Medication List     STOP taking these medications    Breztri Aerosphere 160-9-4.8 MCG/ACT Aero Generic drug: Budeson-Glycopyrrol-Formoterol   metoprolol tartrate 50 MG tablet Commonly known as: LOPRESSOR       TAKE these medications    albuterol 108 (90 Base) MCG/ACT inhaler Commonly known as: ProAir HFA 2 puffs every 4 hours as needed only  if your can't catch your breath   atorvastatin 20 MG tablet Commonly known as: LIPITOR Take 20 mg by mouth daily.   carvedilol 6.25 MG tablet Commonly known as: COREG TAKE 1 AND 1/2 TABLETS(9.375 MG) BY MOUTH TWICE DAILY WITH [place] MEAL   denosumab 60 MG/ML Sosy injection Commonly known as: PROLIA Inject 60 mg into Dana skin every 6 (six) months.   diltiazem 30 MG tablet Commonly known as: Cardizem Take 1 tablet every  4 hours AS NEEDED for AFIB heart rate >100   Eliquis 5 MG Tabs tablet Generic drug: apixaban TAKE 1 TABLET(5 MG) BY MOUTH TWICE DAILY   fluticasone 50 MCG/ACT nasal spray Commonly known as: FLONASE Place 2 sprays into both nostrils daily.   guaiFENesin 600 MG 12 hr tablet Commonly known as: MUCINEX Take 1 tablet (600 mg total) by mouth 2 (two) times daily.   sertraline 100 MG tablet Commonly known as: ZOLOFT Take 100 mg by mouth daily.   Trelegy Ellipta 100-62.5-25 MCG/ACT Aepb Generic drug: Fluticasone-Umeclidin-Vilant Inhale 1 puff into Dana lungs daily.       No Known Allergies  Follow-up Information     Garvin Fila, MD Follow up in 2 month(s).   Specialties: Neurology, Radiology Why: Follow up with Dr. Leonie Man or NP/PA in 8 weeks Contact information: [address] [place] [address] [phone]                  Dana results of significant diagnostics from this hospitalization (including imaging, microbiology, ancillary and laboratory) are listed below for reference.    Significant Diagnostic Studies: ECHOCARDIOGRAM COMPLETE  Result Date: 12/21/2021    ECHOCARDIOGRAM REPORT   Patient Name:   Dana Pruitt Date of Exam: 12/21/2021 Medical Rec #:  [id number]       Height:       64.0 in Accession #:    [id number]      Weight:       121.9 lb Date of Birth:  [birth year]      BSA:          1.585 m Patient Age:    12 years        BP:           153/84 mmHg Patient Gender: F               HR:           67 bpm. Exam Location:  Inpatient Procedure: 2D Echo, Color Doppler and Cardiac Doppler Indications:    Stroke i63.9  History:        Patient has prior history of Echocardiogram examinations, most                 recent 10/07/2021. CHF, COPD and Pulmonary HTN, Arrythmias:Atrial                 Fibrillation; Risk Factors:Hypertension and Dyslipidemia.                 Aortic Valve: 21 mm Dana Pruitt valve is present in                 Dana aortic position. Procedure Date: 01/31/19.  Sonographer:    Raquel Sarna Senior RDCS Referring Phys: [id number] [place]  1. Normal LV function; s/p AVR with mean gradient 13 mmHg unchanged compared to previous.  2. Left ventricular ejection fraction, by estimation, is 60 to 65%. Dana left ventricle has normal function. Dana left ventricle has no regional wall motion abnormalities. Left ventricular diastolic parameters are consistent with Grade I diastolic dysfunction (impaired relaxation).  3. Right ventricular systolic function is normal. Dana right ventricular size is normal. There is normal pulmonary artery systolic pressure.  4. Dana mitral valve is normal in structure. Trivial mitral valve regurgitation. No evidence of mitral stenosis.  5. Dana aortic valve has been repaired/replaced. Aortic  valve regurgitation is not visualized. No aortic stenosis is present. There  is Dana Pruitt 21 mm Dana Pruitt valve present in Dana aortic position. Procedure Date: 01/31/19.  6. There is mild dilatation of Dana ascending aorta, measuring 43 mm.  7. Dana inferior vena cava is normal in size with greater than 50% respiratory variability, suggesting right atrial pressure of 3 mmHg. FINDINGS  Left Ventricle: Left ventricular ejection fraction, by estimation, is 60 to 65%. Dana left ventricle has normal function. Dana left ventricle has no regional wall motion abnormalities. Dana left ventricular internal cavity size was normal in size. There is  no left ventricular hypertrophy. Left ventricular diastolic parameters are consistent with Grade I diastolic dysfunction (impaired relaxation). Right Ventricle: Dana right ventricular size is normal. Right ventricular systolic function is normal. There is normal pulmonary artery systolic pressure. Dana tricuspid regurgitant velocity is 2.57 m/s, and with an assumed right atrial pressure of 3 mmHg,  Dana estimated right ventricular systolic pressure is 54.0 mmHg. Left Atrium: Left atrial size was normal in size. Right Atrium: Right atrial size was normal in size. Pericardium: There is no evidence of pericardial effusion. Mitral Valve: Dana mitral valve is normal in structure. Trivial mitral valve regurgitation. No evidence of mitral valve stenosis. Tricuspid Valve: Dana tricuspid valve is normal in structure. Tricuspid valve regurgitation is mild . No evidence of tricuspid stenosis. Aortic Valve: Dana aortic valve has been repaired/replaced. Aortic valve regurgitation is not visualized. No aortic stenosis is present. Aortic valve mean gradient measures 13.0 mmHg. Aortic valve peak gradient measures 18.7 mmHg. Aortic valve area, by VTI measures 0.99 cm. There is Dana Pruitt 21 mm Dana Pruitt valve present in Dana aortic position. Procedure Date: 01/31/19. Pulmonic Valve: Dana pulmonic  valve was normal in structure. Pulmonic valve regurgitation is trivial. No evidence of pulmonic stenosis. Aorta: Dana aortic root is normal in size and structure. There is mild dilatation of Dana ascending aorta, measuring 43 mm. Venous: Dana inferior vena cava is normal in size with greater than 50% respiratory variability, suggesting right atrial pressure of 3 mmHg. IAS/Shunts: Dana interatrial septum is aneurysmal. No atrial level shunt detected by color flow Doppler. Additional Comments: Normal LV function; s/p AVR with mean gradient 13 mmHg unchanged compared to previous.  LEFT VENTRICLE PLAX 2D LVIDd:         3.50 cm   Diastology LVIDs:         1.90 cm   LV e' medial:    6.20 cm/s LV PW:         0.90 cm   LV E/e' medial:  6.8 LV IVS:        0.90 cm   LV e' lateral:   7.83 cm/s LVOT diam:     2.00 cm   LV E/e' lateral: 5.4 LV SV:         46 LV SV Index:   29 LVOT Area:     3.14 cm  RIGHT VENTRICLE RV S prime:     8.70 cm/s TAPSE (M-mode): 1.3 cm LEFT ATRIUM             Index        RIGHT ATRIUM           Index LA diam:        3.50 cm 2.21 cm/m   RA Area:     13.90 cm LA Vol (A2C):   30.5 ml 19.24 ml/m  RA Volume:   29.10 ml  18.36 ml/m LA Vol (A4C):   39.6 ml 24.98 ml/m LA Biplane Vol: 36.2  ml 22.84 ml/m  AORTIC VALVE AV Area (Vmax):    1.07 cm AV Area (Vmean):   0.96 cm AV Area (VTI):     0.99 cm AV Vmax:           216.00 cm/s AV Vmean:          175.000 cm/s AV VTI:            0.459 m AV Peak Grad:      18.7 mmHg AV Mean Grad:      13.0 mmHg LVOT Vmax:         73.40 cm/s LVOT Vmean:        53.300 cm/s LVOT VTI:          0.145 m LVOT/AV VTI ratio: 0.32  AORTA Ao Root diam: 3.20 cm Ao Asc diam:  4.30 cm MITRAL VALVE               TRICUSPID VALVE MV Area (PHT): 2.69 cm    TR Peak grad:   26.4 mmHg MV Decel Time: 282 msec    TR Vmax:        257.00 cm/s MV E velocity: 41.90 cm/s MV Dana Pruitt velocity: 85.40 cm/s  SHUNTS MV E/Burle Kwan ratio:  0.49        Systemic VTI:  0.14 m                            Systemic Diam: 2.00  cm Kirk Ruths MD Electronically signed by Kirk Ruths MD Signature Date/Time: 12/21/2021/12:13:35 PM    Final    CT ANGIO HEAD NECK W WO CM  Result Date: 12/20/2021 CLINICAL DATA:  Follow-up examination for acute stroke. EXAM: CT ANGIOGRAPHY HEAD AND NECK TECHNIQUE: Multidetector CT imaging of Dana head and neck was performed using Dana standard protocol during bolus administration of intravenous contrast. Multiplanar CT image reconstructions and MIPs were obtained to evaluate Dana vascular anatomy. Carotid stenosis measurements (when applicable) are obtained utilizing NASCET criteria, using Dana distal internal carotid diameter as Dana denominator. RADIATION DOSE REDUCTION: This exam was performed according to Dana departmental dose-optimization program which includes automated exposure control, adjustment of the mA and/or kV according to patient size and/or use of iterative reconstruction technique. CONTRAST:  Please see contrast documentation. COMPARISON:  Comparison made with brain MRI from earlier Dana same day. Breast also made with prior MRIs from 03/09/2014 and 04/06/2014. FINDINGS: CT HEAD FINDINGS Brain: Age-related cerebral atrophy with moderate chronic small vessel ischemic disease. Previously identified small left occipital infarct not significantly changed from prior. No associated hemorrhage or mass effect. No other acute large vessel territory infarct. No acute intracranial hemorrhage. No mass lesion, midline shift or mass effect. No hydrocephalus or extra-axial fluid collection. Vascular: No hyperdense vessel. Scattered vascular calcifications noted within Dana carotid siphons. Skull: Scalp soft tissues and calvarium demonstrate no acute finding. Sinuses: Visualized paranasal sinuses and mastoid air cells are clear. Orbits: Globes orbital soft tissues demonstrate no acute finding. Review of Dana MIP images confirms Dana above findings CTA NECK FINDINGS Aortic arch: Visualized aortic arch normal in  caliber with standard 3 vessel morphology. Moderate aortic atherosclerosis. No visible high-grade stenosis about Dana origin Dana great vessels. Right carotid system: Right common and internal carotid arteries are diffusely tortuous. Mild atheromatous change about Dana right carotid bulb/proximal right ICA without hemodynamically significant greater than 50% stenosis. No dissection or other acute finding. Left carotid system: Left common and internal carotid arteries are  diffusely tortuous. Mild eccentric calcified plaque about Dana left carotid bulb/proximal left ICA without hemodynamically significant greater than 50% stenosis. No dissection or other acute finding. Vertebral arteries: Both vertebral arteries arise from Dana subclavian arteries. No proximal subclavian artery stenosis. Both vertebral arteries widely patent without stenosis, dissection or occlusion. Skeleton: No discrete or worrisome osseous lesions. Mild for age cervical spondylosis. Other neck: No other acute soft tissue abnormality within Dana neck. Upper chest: Advanced centrilobular emphysematous changes noted within Dana visualized lungs. Visualized upper chest demonstrates no other acute finding. Review of Dana MIP images confirms Dana above findings CTA HEAD FINDINGS Anterior circulation: Petrous segments patent bilaterally. Mild for age atheromatous change throughout Dana carotid siphons without stenosis. A1 segments patent bilaterally. Right A1 slightly hypoplastic. Normal anterior communicating complex. Both ACAs patent without significant stenosis. No M1 stenosis. No proximal MCA branch occlusion. Atheromatous irregularity involving Dana left M2/M3 branches with mild to moderate stenosis (series 12, image 22). Distal MCA branches otherwise well perfused bilaterally. Posterior circulation: Both V4 segments patent without stenosis. Both PICA patent. Basilar patent to its distal aspect without stenosis. Superior cerebellar arteries patent bilaterally.  Both PCAs primarily supplied via Dana basilar. PCAs are widely patent at least through their distal P2 segments, not well seen distally on this examination. Venous sinuses: Grossly patent allowing for timing Dana contrast bolus. Anatomic variants: None significant.  No aneurysm. Review of Dana MIP images confirms Dana above findings IMPRESSION: CT HEAD: 1. No significant interval change in previously identified subcentimeter acute ischemic left occipital infarct. No associated hemorrhage or mass effect. 2. No other acute intracranial abnormality. 3. Age-related cerebral atrophy with moderate chronic small vessel ischemic disease. CTA HEAD AND NECK: 1. Negative CTA for large vessel occlusion or other emergent finding. 2. Intracranial atherosclerotic disease with associated mild to moderate proximal left M2/M3 stenoses. Otherwise, relatively mild for age atheromatous changes seen elsewhere about Dana major arterial vasculature of Dana head and neck. No other significant or correctable stenosis. 3. Diffuse tortuosity of Dana major arterial vasculature of Dana neck, suggesting chronic underlying hypertension. 4. Aortic Atherosclerosis (ICD10-I70.0) and Emphysema (ICD10-J43.9). Electronically Signed   By: Jeannine Boga M.D.   On: 12/20/2021 22:26   MR BRAIN WO CONTRAST  Result Date: 12/20/2021 CLINICAL DATA:  TIA. Lightheadedness and sudden amnesia. Question cerebellar lesion. EXAM: MRI HEAD WITHOUT CONTRAST TECHNIQUE: Multiplanar, multiecho pulse sequences of Dana brain and surrounding structures were obtained without intravenous contrast. COMPARISON:  MR head without contrast 02/27/2014. FINDINGS: Brain: Acute nonhemorrhagic infarct is present in Dana left occipital lobe measuring up to 5 mm. No other acute infarcts are present. Mild atrophy is progressed. Moderate diffuse periventricular and subcortical T2 hyperintensities has progressed as well. White matter changes extend into Dana anterior limb of Dana internal  capsule. Basal ganglia are intact. Thalami are unremarkable. Progressive white matter changes extend into Dana brainstem. Cerebellum is unremarkable. Dana internal auditory canals are within normal limits. Vascular: Flow is present in Dana major intracranial arteries. Skull and upper cervical spine: Dana craniocervical junction is normal. Upper cervical spine is within normal limits. Marrow signal is unremarkable. Sinuses/Orbits: Dana paranasal sinuses and mastoid air cells are clear. Dana globes and orbits are within normal limits. IMPRESSION: 1. Acute nonhemorrhagic infarct of Dana left occipital lobe measures up to 5 mm. 2. Progressive atrophy and diffuse white matter disease. This likely reflects Dana sequela of chronic microvascular ischemia. Electronically Signed   By: San Morelle M.D.   On: 12/20/2021 17:34    Microbiology:  No results found for this or any previous visit (from Dana past 240 hour(s)).   Labs: Basic Metabolic Panel: Recent Labs  Lab 12/20/21 1436 12/20/21 1450 12/21/21 0255  NA 141 141 138  K 4.4 4.3 3.5  CL 110 108 109  CO2 23  --  22  GLUCOSE 111* 105* 107*  BUN '14 16 11  '$ CREATININE 1.00 1.00 0.96  CALCIUM 9.4  --  9.0  MG 2.1  --  2.0   Liver Function Tests: Recent Labs  Lab 12/20/21 1436 12/21/21 0255  AST 17 13*  ALT 14 12  ALKPHOS 33* 30*  BILITOT 0.8 0.7  PROT 6.7 5.7*  ALBUMIN 4.2 3.6   No results for input(s): "LIPASE", "AMYLASE" in Dana last 168 hours. No results for input(s): "AMMONIA" in Dana last 168 hours. CBC: Recent Labs  Lab 12/20/21 1436 12/20/21 1450 12/21/21 0255  WBC 5.2  --  4.5  NEUTROABS 3.6  --  2.7  HGB 12.7 12.2 11.8*  HCT 38.6 36.0 35.0*  MCV 95.1  --  91.6  PLT 205  --  197   Cardiac Enzymes: No results for input(s): "CKTOTAL", "CKMB", "CKMBINDEX", "TROPONINI" in Dana last 168 hours. BNP: BNP (last 3 results) Recent Labs    12/11/21 1003  BNP 181.6*    ProBNP (last 3 results) No results for input(s): "PROBNP"  in Dana last 8760 hours.  CBG: No results for input(s): "GLUCAP" in Dana last 168 hours.     Signed:  Fayrene Helper MD.  [facility] 12/21/2021, 2:20 PM

## 2021-12-21 NOTE — Hospital Course (Signed)
Dana Pruitt is [name] 81 y.o. female with medical history significant for paroxysmal atrial fibrillation chronically anticoagulated on Eliquis, aortic regurgitation and status post bioprosthetic aortic valve replacement in August 2020, hypertension, hyperlipidemia, chronic diastolic heart failure, COPD, allergic rhinitis, who is admitted to [facility] on 12/20/2021 with acute ischemic stroke after presenting from home to [facility] ED complaining of amnesia.   MRI with acute nonhemorrhagic infarct of the L occipital lobe.  Stroke workup complete, recommending continuing eliquis and outpatient sleep study.    See below for additional details

## 2021-12-21 NOTE — Progress Notes (Addendum)
STROKE TEAM PROGRESS NOTE   INTERVAL HISTORY No family at the bedside. Recommend resuming Eliquis. Patient states she feels back to her baseline. Hemodynamically and neurologically stable.  She does snore and occasionally wake up feeling tired. She is willing to do an outpatient sleep study.   Vitals:   12/20/21 2114 12/20/21 2304 12/21/21 0406 12/21/21 0500  BP: (!) 172/101 (!) 154/90 (!) 153/84   Pulse: 65 65 67   Resp: '18 18 18   '$ Temp: 97.6 F (36.4 C) 98 F (36.7 C) 98 F (36.7 C)   TempSrc: Oral Oral Oral   SpO2: 94% 93% 95%   Weight:    55.3 kg  Height:       CBC:  Recent Labs  Lab 12/20/21 1436 12/20/21 1450 12/21/21 0255  WBC 5.2  --  4.5  NEUTROABS 3.6  --  2.7  HGB 12.7 12.2 11.8*  HCT 38.6 36.0 35.0*  MCV 95.1  --  91.6  PLT 205  --  128   Basic Metabolic Panel:  Recent Labs  Lab 12/20/21 1436 12/20/21 1450 12/21/21 0255  NA 141 141 138  K 4.4 4.3 3.5  CL 110 108 109  CO2 23  --  22  GLUCOSE 111* 105* 107*  BUN '14 16 11  '$ CREATININE 1.00 1.00 0.96  CALCIUM 9.4  --  9.0  MG 2.1  --  2.0   Lipid Panel:  Recent Labs  Lab 12/21/21 0255  CHOL 125  TRIG 73  HDL 41  CHOLHDL 3.0  VLDL 15  LDLCALC 69   HgbA1c:  Recent Labs  Lab 12/20/21 1436  HGBA1C 5.9*   Urine Drug Screen: No results for input(s): "LABOPIA", "COCAINSCRNUR", "LABBENZ", "AMPHETMU", "THCU", "LABBARB" in the last 168 hours.  Alcohol Level  Recent Labs  Lab 12/20/21 1436  ETH <10    IMAGING past 24 hours CT ANGIO HEAD NECK W WO CM  Result Date: 12/20/2021 CLINICAL DATA:  Follow-up examination for acute stroke. EXAM: CT ANGIOGRAPHY HEAD AND NECK TECHNIQUE: Multidetector CT imaging of the head and neck was performed using the standard protocol during bolus administration of intravenous contrast. Multiplanar CT image reconstructions and MIPs were obtained to evaluate the vascular anatomy. Carotid stenosis measurements (when applicable) are obtained utilizing NASCET criteria,  using the distal internal carotid diameter as the denominator. RADIATION DOSE REDUCTION: This exam was performed according to the departmental dose-optimization program which includes automated exposure control, adjustment of the mA and/or kV according to patient size and/or use of iterative reconstruction technique. CONTRAST:  Please see contrast documentation. COMPARISON:  Comparison made with brain MRI from earlier the same day. Breast also made with prior MRIs from 03/09/2014 and 04/06/2014. FINDINGS: CT HEAD FINDINGS Brain: Age-related cerebral atrophy with moderate chronic small vessel ischemic disease. Previously identified small left occipital infarct not significantly changed from prior. No associated hemorrhage or mass effect. No other acute large vessel territory infarct. No acute intracranial hemorrhage. No mass lesion, midline shift or mass effect. No hydrocephalus or extra-axial fluid collection. Vascular: No hyperdense vessel. Scattered vascular calcifications noted within the carotid siphons. Skull: Scalp soft tissues and calvarium demonstrate no acute finding. Sinuses: Visualized paranasal sinuses and mastoid air cells are clear. Orbits: Globes orbital soft tissues demonstrate no acute finding. Review of the MIP images confirms the above findings CTA NECK FINDINGS Aortic arch: Visualized aortic arch normal in caliber with standard 3 vessel morphology. Moderate aortic atherosclerosis. No visible high-grade stenosis about the origin the great vessels. Right  carotid system: Right common and internal carotid arteries are diffusely tortuous. Mild atheromatous change about the right carotid bulb/proximal right ICA without hemodynamically significant greater than 50% stenosis. No dissection or other acute finding. Left carotid system: Left common and internal carotid arteries are diffusely tortuous. Mild eccentric calcified plaque about the left carotid bulb/proximal left ICA without hemodynamically  significant greater than 50% stenosis. No dissection or other acute finding. Vertebral arteries: Both vertebral arteries arise from the subclavian arteries. No proximal subclavian artery stenosis. Both vertebral arteries widely patent without stenosis, dissection or occlusion. Skeleton: No discrete or worrisome osseous lesions. Mild for age cervical spondylosis. Other neck: No other acute soft tissue abnormality within the neck. Upper chest: Advanced centrilobular emphysematous changes noted within the visualized lungs. Visualized upper chest demonstrates no other acute finding. Review of the MIP images confirms the above findings CTA HEAD FINDINGS Anterior circulation: Petrous segments patent bilaterally. Mild for age atheromatous change throughout the carotid siphons without stenosis. A1 segments patent bilaterally. Right A1 slightly hypoplastic. Normal anterior communicating complex. Both ACAs patent without significant stenosis. No M1 stenosis. No proximal MCA branch occlusion. Atheromatous irregularity involving the left M2/M3 branches with mild to moderate stenosis (series 12, image 22). Distal MCA branches otherwise well perfused bilaterally. Posterior circulation: Both V4 segments patent without stenosis. Both PICA patent. Basilar patent to its distal aspect without stenosis. Superior cerebellar arteries patent bilaterally. Both PCAs primarily supplied via the basilar. PCAs are widely patent at least through their distal P2 segments, not well seen distally on this examination. Venous sinuses: Grossly patent allowing for timing the contrast bolus. Anatomic variants: None significant.  No aneurysm. Review of the MIP images confirms the above findings IMPRESSION: CT HEAD: 1. No significant interval change in previously identified subcentimeter acute ischemic left occipital infarct. No associated hemorrhage or mass effect. 2. No other acute intracranial abnormality. 3. Age-related cerebral atrophy with moderate  chronic small vessel ischemic disease. CTA HEAD AND NECK: 1. Negative CTA for large vessel occlusion or other emergent finding. 2. Intracranial atherosclerotic disease with associated mild to moderate proximal left M2/M3 stenoses. Otherwise, relatively mild for age atheromatous changes seen elsewhere about the major arterial vasculature of the head and neck. No other significant or correctable stenosis. 3. Diffuse tortuosity of the major arterial vasculature of the neck, suggesting chronic underlying hypertension. 4. Aortic Atherosclerosis (ICD10-I70.0) and Emphysema (ICD10-J43.9). Electronically Signed   By: Jeannine Boga M.D.   On: 12/20/2021 22:26   MR BRAIN WO CONTRAST  Result Date: 12/20/2021 CLINICAL DATA:  TIA. Lightheadedness and sudden amnesia. Question cerebellar lesion. EXAM: MRI HEAD WITHOUT CONTRAST TECHNIQUE: Multiplanar, multiecho pulse sequences of the brain and surrounding structures were obtained without intravenous contrast. COMPARISON:  MR head without contrast 02/27/2014. FINDINGS: Brain: Acute nonhemorrhagic infarct is present in the left occipital lobe measuring up to 5 mm. No other acute infarcts are present. Mild atrophy is progressed. Moderate diffuse periventricular and subcortical T2 hyperintensities has progressed as well. White matter changes extend into the anterior limb of the internal capsule. Basal ganglia are intact. Thalami are unremarkable. Progressive white matter changes extend into the brainstem. Cerebellum is unremarkable. The internal auditory canals are within normal limits. Vascular: Flow is present in the major intracranial arteries. Skull and upper cervical spine: The craniocervical junction is normal. Upper cervical spine is within normal limits. Marrow signal is unremarkable. Sinuses/Orbits: The paranasal sinuses and mastoid air cells are clear. The globes and orbits are within normal limits. IMPRESSION: 1. Acute nonhemorrhagic infarct of  the left occipital  lobe measures up to 5 mm. 2. Progressive atrophy and diffuse white matter disease. This likely reflects the sequela of chronic microvascular ischemia. Electronically Signed   By: San Morelle M.D.   On: 12/20/2021 17:34    PHYSICAL EXAM  Physical Exam  Constitutional: Appears well-developed and well-nourished.   Cardiovascular: Normal rate and regular rhythm.  Respiratory: Effort normal, non-labored breathing  Neuro: Mental Status: Patient is awake, alert, oriented to person, place, month, year, and situation. Patient is able to give a clear and coherent history. No signs of aphasia or neglect Cranial Nerves: II: Visual Fields are full. Pupils are equal, round, and reactive to light.   III,IV, VI: EOMI without ptosis or diploplia.  V: Facial sensation is symmetric to temperature VII: Facial movement is symmetric resting and smiling VIII: Hearing is intact to voice X: Palate elevates symmetrically XI: Shoulder shrug is symmetric. XII: Tongue protrudes midline without atrophy or fasciculations.  Motor: Tone is normal. Bulk is normal. 5/5 strength was present in all four extremities.  Sensory: Sensation is symmetric to light touch and temperature in the arms and legs. No extinction to DSS present.  Deep Tendon Reflexes: 2+ and symmetric in the biceps and patellae.  Plantars: Toes are downgoing bilaterally.  Cerebellar: FNF and HKS are intact bilaterally    ASSESSMENT/PLAN Ms. Dana Pruitt is a 81 y.o. 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 She does report numbness in her right arm a couple of weeks ago. Recommend  outpatient sleep apnea study. She does report occasionally waking up tired as well.   Stroke:  Left occipital acute infarct  Etiology:  cardio embolic vs large vessel atherosclerosis  CTA head & neck Intracranial atherosclerotic disease with associated mild to moderate proximal left M2/M3 stenoses. Otherwise, relatively mild for age atheromatous changes seen elsewhere about the major arterial vasculature of the head and neck. Diffuse tortuosity of the major arterial vasculature of the neck, suggesting chronic underlying hypertension. MRI  small left occipital acute infarct 2D Echo 60-65%. No thrombus. LDL 69 HgbA1c 5.9 VTE prophylaxis - SCDs, Eliquis    Diet   Diet regular Room service appropriate? Yes; Fluid consistency: Thin   Eliquis (apixaban) daily prior to admission, plan to resume Eliquis Therapy recommendations:  No follow recommended Disposition:  Home  Paroxysmal atrial fibrillation Home meds: Eliquis, lopressor  Continue home medications  Hypertension Home meds:  lopressor  Stable Permissive hypertension (OK if < 220/120) but gradually normalize in 5-7 days Long-term BP goal normotensive  Hyperlipidemia Home meds:  Atorvastatin '20mg'$ , resumed in hospital LDL 69, goal < 70 Continue statin at discharge  Other Stroke Risk Factors Advanced Age >/= 23  Coronary artery disease Congestive heart failure  Other Active Problems COPD- continue home medications  Hospital day # 0  Patient seen and examined by NP/APP with MD. MD to update note as needed.   Janine Ores, DNP, FNP-BC [facility] Pager: [phone]  ATTENDING ATTESTATION:  Resume eliquis. Workup neg so far. Etiology maybe h/o afib. Needs workup for sleep apnea with h/o snoring. Uncontrolled sleep apnea can increase stroke risk and increase incidence of afib.  She is aware.   Dr. Reeves Forth  evaluated pt independently, reviewed imaging, chart, labs. Discussed and formulated plan with the APP.  Please see APP note above for details.   Total 36 minutes spent on counseling patient and coordinating care, writing notes and reviewing chart.  Olanda Boughner,MD    To contact Stroke Continuity provider, please refer to [email or web address] After hours, contact General Neurology

## 2021-12-21 NOTE — Progress Notes (Signed)
PT Cancellation Note  Patient Details Name: Dana Pruitt MRN: [id number] DOB: [birth year]   Cancelled Treatment:    Reason Eval/Treat Not Completed: PT screened, no needs identified, will sign off.  Discussed pt case with OT who reports pt is currently mobilizing at an independent level and does not require a formal PT evaluation at this time. Met with pt who reports feeling back to baseline of function. PT signing off. If needs change, please reconsult.     Thelma Comp 12/21/2021, 9:06 AM  Rolinda Roan, PT, DPT Acute Rehabilitation Services Secure Chat Preferred Office: [phone]

## 2021-12-21 NOTE — Evaluation (Signed)
Occupational Therapy Evaluation Patient Details Name: Dana Pruitt MRN: [id number] DOB: August 14, 1940 Today's Date: 12/21/2021   History of Present Illness Pt is a 81 y/o F presenting to ED on 7/1 with confusion and lightheadedness, MRI revealing small occipital CVA. PMH includes HTN, HLD, COPD, CHF, breast CA, and anxiety.   Clinical Impression   Pt lives with spouse, is independent at baseline with ADLs and functional mobility. Currently, pt mod I for all aspects of ADLs, bed mobility, and transfers. Reports symptoms have resolved, visual and cognitive assessment WFL. Pt presenting with impairments listed below, however has no acute OT needs at this time, will s/o. Recommend d/c home with assistance.      Recommendations for follow up therapy are one component of a multi-disciplinary discharge planning process, led by the attending physician.  Recommendations may be updated based on patient status, additional functional criteria and insurance authorization.   Follow Up Recommendations  No OT follow up    Assistance Recommended at Discharge PRN  Patient can return home with the following Direct supervision/assist for medications management;Direct supervision/assist for financial management;Assistance with cooking/housework;Assist for transportation    Functional Status Assessment  Patient has had a recent decline in their functional status and demonstrates the ability to make significant improvements in function in a reasonable and predictable amount of time.  Equipment Recommendations  None recommended by OT    Recommendations for Other Services       Precautions / Restrictions Precautions Precautions: None Restrictions Weight Bearing Restrictions: No      Mobility Bed Mobility Overal bed mobility: Modified Independent                  Transfers Overall transfer level: Modified independent                        Balance Overall balance assessment: No  apparent balance deficits (not formally assessed)                                         ADL either performed or assessed with clinical judgement   ADL Overall ADL's : Modified independent                                       General ADL Comments: completes UB/LB dressing at EOB without difficulty, standing grooming task and hallway distance ambulation     Vision Baseline Vision/History: 1 Wears glasses Vision Assessment?: Yes;No apparent visual deficits Eye Alignment: Within Functional Limits Ocular Range of Motion: Within Functional Limits Alignment/Gaze Preference: Within Defined Limits Tracking/Visual Pursuits: Able to track stimulus in all quads without difficulty Convergence: Within functional limits Visual Fields: No apparent deficits Additional Comments: able to read paragraph with glasses on without difficulty     Perception     Praxis      Pertinent Vitals/Pain Pain Assessment Pain Assessment: No/denies pain     Hand Dominance     Extremity/Trunk Assessment Upper Extremity Assessment Upper Extremity Assessment: Overall WFL for tasks assessed   Lower Extremity Assessment Lower Extremity Assessment: Overall WFL for tasks assessed   Cervical / Trunk Assessment Cervical / Trunk Assessment: Normal   Communication Communication Communication: No difficulties   Cognition Arousal/Alertness: Awake/alert Behavior During Therapy: WFL for tasks assessed/performed Overall Cognitive Status: Within Functional  Limits for tasks assessed                                 General Comments: able to recall 3 words after 5 min, can count backwards, recites months of year in reverse order accurately     General Comments  VSS on RA    Exercises     Shoulder Instructions      Home Living Family/patient expects to be discharged to:: Private residence Living Arrangements: Spouse/significant other Available Help at  Discharge: Family;Available 24 hours/day Type of Home: House Home Access: Stairs to enter [employer] of Steps: 2   Home Layout: Two level Alternate Level Stairs-Number of Steps: flight   Bathroom Shower/Tub: Chief Strategy Officer: None          Prior Functioning/Environment Prior Level of Function : Independent/Modified Independent             Mobility Comments: no AD use ADLs Comments: ind, driving        OT Problem List:        OT Treatment/Interventions:      OT Goals(Current goals can be found in the care plan section) Acute Rehab OT Goals Patient Stated Goal: none stated OT Goal Formulation: With patient Time For Goal Achievement: 01/04/22 Potential to Achieve Goals: Good  OT Frequency:      Co-evaluation              AM-PAC OT "6 Clicks" Daily Activity     Outcome Measure Help from another person eating meals?: None Help from another person taking care of personal grooming?: None Help from another person toileting, which includes using toliet, bedpan, or urinal?: None Help from another person bathing (including washing, rinsing, drying)?: None Help from another person to put on and taking off regular upper body clothing?: None Help from another person to put on and taking off regular lower body clothing?: None 6 Click Score: 24   End of Session Nurse Communication: Mobility status  Activity Tolerance: Patient tolerated treatment well Patient left: in bed;with call bell/phone within reach  OT Visit Diagnosis: Unsteadiness on feet (R26.81);Other abnormalities of gait and mobility (R26.89);Muscle weakness (generalized) (M62.81)                Time: 4196-2229 OT Time Calculation (min): 15 min Charges:  OT General Charges $OT Visit: 1 Visit OT Evaluation $OT Eval Low Complexity: 1 Low  Lynnda Child, OTD, OTR/L Acute Rehab [phone] [place] 12/21/2021, 8:41 AM

## 2021-12-24 ENCOUNTER — Telehealth: Payer: Self-pay | Admitting: Student

## 2021-12-24 NOTE — Telephone Encounter (Signed)
Patient had a mimi stroke this past Sunday 7/2, she wanted to let us know. The neurologist advised her to let us know.

## 2021-12-24 NOTE — Telephone Encounter (Signed)
Will route to primary cards as FYI.  Thank you!

## 2021-12-25 ENCOUNTER — Encounter: Payer: Self-pay | Admitting: Oncology

## 2021-12-31 ENCOUNTER — Telehealth (HOSPITAL_COMMUNITY): Payer: Self-pay | Admitting: Emergency Medicine

## 2021-12-31 DIAGNOSIS — I10 Essential (primary) hypertension: Secondary | ICD-10-CM | POA: Diagnosis not present

## 2021-12-31 DIAGNOSIS — I712 Thoracic aortic aneurysm, without rupture, unspecified: Secondary | ICD-10-CM | POA: Diagnosis not present

## 2021-12-31 DIAGNOSIS — I672 Cerebral atherosclerosis: Secondary | ICD-10-CM | POA: Diagnosis not present

## 2021-12-31 NOTE — Telephone Encounter (Signed)
Attempted to call patient regarding upcoming cardiac CT appointment. °Left message on voicemail with name and callback number °Emari Demmer RN Navigator Cardiac Imaging °[facility] °[phone] Office °[phone] Cell ° °

## 2022-01-01 ENCOUNTER — Other Ambulatory Visit: Payer: Self-pay | Admitting: Cardiovascular Disease

## 2022-01-01 ENCOUNTER — Ambulatory Visit (HOSPITAL_COMMUNITY)
Admission: RE | Admit: 2022-01-01 | Discharge: 2022-01-01 | Disposition: A | Discharge status: Home / Self Care | Payer: Medicare Other | Source: Ambulatory Visit | Attending: Student | Admitting: Student

## 2022-01-01 DIAGNOSIS — I428 Other cardiomyopathies: Secondary | ICD-10-CM | POA: Diagnosis not present

## 2022-01-01 DIAGNOSIS — I480 Paroxysmal atrial fibrillation: Secondary | ICD-10-CM | POA: Diagnosis not present

## 2022-01-01 DIAGNOSIS — I2510 Atherosclerotic heart disease of native coronary artery without angina pectoris: Secondary | ICD-10-CM | POA: Diagnosis not present

## 2022-01-01 DIAGNOSIS — Z952 Presence of prosthetic heart valve: Secondary | ICD-10-CM | POA: Diagnosis not present

## 2022-01-01 DIAGNOSIS — I351 Nonrheumatic aortic (valve) insufficiency: Secondary | ICD-10-CM

## 2022-01-01 DIAGNOSIS — M79602 Pain in left arm: Secondary | ICD-10-CM | POA: Diagnosis not present

## 2022-01-01 DIAGNOSIS — R55 Syncope and collapse: Secondary | ICD-10-CM

## 2022-01-01 DIAGNOSIS — R931 Abnormal findings on diagnostic imaging of heart and coronary circulation: Secondary | ICD-10-CM | POA: Insufficient documentation

## 2022-01-01 MED ORDER — NITROGLYCERIN 0.4 MG SL SUBL: 0.8000 mg | SUBLINGUAL_TABLET | Freq: Once | SUBLINGUAL | Status: AC

## 2022-01-01 MED ORDER — NITROGLYCERIN 0.4 MG SL SUBL
SUBLINGUAL_TABLET | SUBLINGUAL | Status: AC
  Administered 2022-01-01: 0.8 mg via SUBLINGUAL
  Filled 2022-01-01: qty 2

## 2022-01-01 MED ORDER — IOHEXOL 350 MG/ML SOLN
100.0000 mL | Freq: Once | INTRAVENOUS | Status: AC | PRN
  Administered 2022-01-01: 100 mL via INTRAVENOUS

## 2022-01-02 ENCOUNTER — Ambulatory Visit (HOSPITAL_COMMUNITY)
Admission: RE | Admit: 2022-01-02 | Discharge: 2022-01-02 | Disposition: A | Discharge status: Home / Self Care | Payer: Medicare Other | Source: Ambulatory Visit | Attending: Cardiovascular Disease | Admitting: Cardiovascular Disease

## 2022-01-02 DIAGNOSIS — R55 Syncope and collapse: Secondary | ICD-10-CM | POA: Diagnosis not present

## 2022-01-02 DIAGNOSIS — I2510 Atherosclerotic heart disease of native coronary artery without angina pectoris: Secondary | ICD-10-CM

## 2022-01-02 NOTE — Progress Notes (Unsigned)
Cardiology Office Note:    Date:  01/14/2022   ID:  Dana Pruitt, DOB 11-Sep-1940, MRN [id number]  PCP:  Deland Pretty, MD  Cardiologist:  Quay Burow, MD  Electrophysiologist:  None   Referring MD: Deland Pretty, MD   Chief Complaint: follow-up of dyspnea on exertion and coronary CTA  History of Present Illness:    Dana Pruitt is a 81 y.o. female with a history of history of mild non-obstructive CAD on cardiac cath in 12/2018, severe aortic insufficiency s/p AVR in 40/9811, chronic systolic CHF/non-ischemic cardiomyopathy with normalization of EF, paroxysmal atrial fibrillation on Eliquis, mild mitral regurgitation, recent stroke in 12/2021, COPD, hypertension, hyperlipidemia, GERD, anxiety/depression, and breast cancer s/p chemotherapy/radiation and lumpectomy in 2013  who is followed by Dr. [name] who presents today for follow-up of shortness of breath and coronary CTA.  Patient was referred to Dr. [name] in 2015 after Echo showed LVEF of 35-40% with moderate aortic insufficiency. She was started on beta-blocker for her cardiomyopathy and EF eventually normalized to to 60-65% on repeat Echo in 06/2016. Aortic disease progressed and Echo in 09/2018 showed LVEF of 40-45 with diffuse hypokinesis, grade 1 diastolic dysfunction, and severe AI.She was admitted in 12/2018 with chest pain and shortness of breath. She underwent R/LHC at that time which showed mild non-obstructive CAD, wide aortic pulse pressure suggestive of significant AI, preserved cardiac output, and low filling pressures. Patient ultimately underwent AVR with 53m Inspiris Edwards valve by Dr. VPrescott Gumon 01/31/2019. She did develop post-op atrial fibrillation but converted back to sinus rhythm after initiation of Amiodarone. She was discharge on Amiodarone and Coumadin both of which were able to be discontinued later. However, she did have recurrent episodes of atrial fibrillation. Most recent Echo in 09/2019 LVEF of 55-60% with  well functioning AVR. [facility] and was started on Eliquis. Her Coreg was decreased at that time due to dizziness. Myoview and Event Monitor were ordered in 07/2020 for further evaluation of left shoulder pain with associated nausea and near syncope as well as several episodes of dizziness. Myoview was low risk with no evidence of ischemia. Dizziness actually improved after starting a new inhaler so patient requested holding off on Event Monitor for now. Routine Echo in 09/2020 for monitoring of AI showed LVEF of 60-65% with grade 1 diastolic dysfunction, and well functioning aortic bioprosthesis. Also showed mild MR and mild dilatation of the ascending aorta measuring 42 mm.  Patient was recently seen by me on 12/11/2021 at which time she reported dyspnea on exertion as well as increased dizziness over the prior month which seemed to be correlated to when she self adjusted and increased her Sertraline. Symptoms almost completely resolved after decreasing back to normal dose. She also reported one episode of left arm pain but no chest pain. However, she was very concerned about this episode so coronary CTA was ordered for further evaluation.   Before coronary CTA could be performed, she was admitted from 12/20/2021 to 12/21/2021 for an acute ischemic stroke after presenting with amnesia. MRI showed acute non-hemorrhagic infarct of the left occipital lobe. Echo showed LVEF of 60-65% with normal wall motion and grade 1 diastolic dysfunction, stable AVR, and mild dilatation of the ascending aorta measuring 43 mm. She was seen by Neurology who recommended continuing Eliquis.  Coronary CTA showed coronary calcium score of 709 (87th percentile for age and sex) with mild (25-49%) plaque in the RCA,  LAD, and LCX (FFR negative) as well as as a 4.2 cm ascending aortic aneurysm (stable from prior imaging), signs of pulmonary emphysema, and new 79m spiculated  pulmonary nodule in the anterior right upper lobe.  Patient presents today for follow-up. Here alone. Patient is doing better since last visit. She denies any chest pain or recurrent left arm. She has occasional dyspnea on exertion when she is going up the stairs but much better than before. No orthopnea, PND, or lower extremity edema. She does report today that she has been feeling lightheaded/dizzy/woozy. She is wondering if this is do to her BP. Her systolic BP is normally in the 160s to 170s in the morning before she takes any medications and then drops to the 130s to 140s afterwards. However, her systolic BP was in the 1229Nthis morning. BP 132/74 in the office today. She states her PCP stopped her Coreg and switched her to Toprol-XL 226mdaily about one week ago. She denies any palpitations. No syncope.   Past Medical History:  Diagnosis Date   Anemia    "several times over the years" (10/17/2012)   Anxiety    [facility]   right breast   CAD (coronary artery disease)    a. LHC 12/2018: mild non-obstructive CAD   Carcinoma of breast treated with adjuvant hormone therapy ([facility]   [place]   Chronic systolic CHF    a. Echo in 2015: LVEF of 35-40%, b. Echo in 12/2021: LVEF of 60-65%   COPD (chronic obstructive pulmonary disease) (HCC)    Depression    GERD (gastroesophageal reflux disease)    diet controlled, no med   Hollenhorst plaque, left eye    apparently resolved at recent ophthalmology visit   Hyperlipidemia    Hypertension    IDC, RightStage II, 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   Moderate aortic insufficiency    Non-ischemic cardiomyopathy (HCC)    Paroxysmal atrial fibrillation (HCC)    Pneumonia    "that's why I'm here" (10/17/2012)   S/P AVR (aortic valve replacement) 04/26/2019   Stroke ([facility]   12/2021    Past Surgical History:  Procedure Laterality Date   AORTIC VALVE REPLACEMENT N/A 01/31/2019   Procedure: AORTIC VALVE REPLACEMENT (AVR) USING 21 MM  INSPIRIS RESILIS AORTIC VALVE. SN: : [id number] Surgeon: VaPrescott GumPeCollier SalinaMD;  [facility] Service: [facility];  Laterality: N/A;   APPENDECTOMY  1974   BREAST BIOPSY Right 02/27/10   Needle core Biopsy; Invasive Mammary; ER/PR Positive, Her-2 Neu negative, Ki-67 22%   BREAST LUMPECTOMY WITH SENTINEL LYMPH NODE BIOPSY Right 07/03/11   Invasive Ductal Carcinoma;0/3 nodes negative,; ER,PR Positive, Her-2 Neg; Ki-67 22%   CESAREAN SECTION  1970; 1974   COLONOSCOPY     DILATION AND CURETTAGE OF UTERUS  1970's   RIGHT/LEFT HEART CATH AND CORONARY ANGIOGRAPHY N/A 01/02/2019   Procedure: RIGHT/LEFT HEART CATH AND CORONARY ANGIOGRAPHY;  Surgeon: McLarey DresserMD;  [facility];  Service: Cardiovascular;  Laterality: N/A;   TEE WITHOUT CARDIOVERSION N/A 01/31/2019   Procedure: TRANSESOPHAGEAL ECHOCARDIOGRAM (TEE);  Surgeon: VaPrescott GumPeCollier SalinaMD;  [facility]: [facility];  Laterality: N/A;   TUBAL LIGATION  1974   WISDOM TOOTH EXTRACTION      Current Medications: Current Meds  Medication Sig   albuterol (PROAIR HFA) 108 (90 Base) MCG/ACT inhaler 2 puffs every 4 hours as needed only  if your can't catch your breath  apixaban (ELIQUIS) 5 MG TABS tablet TAKE 1 TABLET(5 MG) BY MOUTH TWICE DAILY   atorvastatin (LIPITOR) 20 MG tablet Take 20 mg by mouth daily.    denosumab (PROLIA) 60 MG/ML SOSY injection Inject 60 mg into the skin every 6 (six) months.   diltiazem (CARDIZEM) 30 MG tablet Take 1 tablet every 4 hours AS NEEDED for AFIB heart rate >100   ezetimibe (ZETIA) 10 MG tablet Take 10 mg by mouth daily.   fluticasone (FLONASE) 50 MCG/ACT nasal spray Place 2 sprays into both nostrils daily.   Fluticasone-Umeclidin-Vilant (TRELEGY ELLIPTA) 100-62.5-25 MCG/ACT AEPB Inhale 1 puff into the lungs daily.   guaiFENesin (MUCINEX) 600 MG 12 hr tablet Take 1 tablet (600 mg total) by mouth 2 (two) times daily.   metoprolol succinate (TOPROL-XL) 25 MG 24 hr  tablet Take 25 mg by mouth daily.   sertraline (ZOLOFT) 100 MG tablet Take 100 mg by mouth daily.     Allergies:   Patient has no known allergies.   Social History   Socioeconomic History   Marital status: Married    Spouse name: Not on file   Number of children: 2   Years of education: college   Highest education level: Not on file  Occupational History    Employer: RETIRED  Tobacco Use   Smoking status: Former    Packs/day: 3.00    Years: 30.00    Total pack years: 90.00    Types: Cigarettes    Start date: 05/19/1958    Quit date: 06/30/1990    Years since quitting: 31.5   Smokeless tobacco: Never   Tobacco comments:    Stopped smoking for 2-3 years during second child.   Vaping Use   Vaping Use: Never used  Substance and Sexual Activity   Alcohol use: Yes    Comment: 1-2 glasses wine/month   Drug use: No   Sexual activity: Yes    Birth control/protection: Post-menopausal    Comment: Menarche age 89, G59,P2, Menopause in 39"s, No HRT Married  Other Topics Concern   Not on file  Social History Narrative   [facility] (12/29/16):   Originally from [place], [place]. Moved to [place] at 81 y.o. Does have a dog. Hasn't worked outside the home. Remote parakeet exposure from her children. No mold or hot tub exposure. Enjoys doing yard work & walking.    Social Determinants of Health   Financial Resource Strain: Not on file  Food Insecurity: Not on file  Transportation Needs: Not on file  Physical Activity: Not on file  Stress: Not on file  Social Connections: Not on file     Family History: The patient's family history includes Cancer in her father and paternal grandmother; Heart disease in her maternal grandfather, maternal grandmother, and mother. There is no history of Lung disease.  ROS:   Please see the history of present illness.     EKGs/Labs/Other Studies Reviewed:    The following studies were reviewed today:  Echocardiogram 12/21/2021: Impressions:  1.  Normal LV function; s/p AVR with mean gradient 13 mmHg unchanged  compared to previous.   2. Left ventricular ejection fraction, by estimation, is 60 to 65%. The  left ventricle has normal function. The left ventricle has no regional  wall motion abnormalities. Left ventricular diastolic parameters are  consistent with Grade I diastolic  dysfunction (impaired relaxation).   3. Right ventricular systolic function is normal. The right ventricular  size is normal. There is normal pulmonary artery systolic pressure.  4. The mitral valve is normal in structure. Trivial mitral valve  regurgitation. No evidence of mitral stenosis.   5. The aortic valve has been repaired/replaced. Aortic valve  regurgitation is not visualized. No aortic stenosis is present. There is a  21 mm Edwards Inspiris Resilia valve present in the aortic position.  Procedure Date: 01/31/19.   6. There is mild dilatation of the ascending aorta, measuring 43 mm.   7. The inferior vena cava is normal in size with greater than 50%  respiratory variability, suggesting right atrial pressure of 3 mmHg.  _______________  Coronary CTA 01/01/2022: Impressions: 1. Coronary calcium score of 709. This was 87th percentile for age-, race-, and sex-matched controls. 2. Normal coronary origin with right dominance. 3. There is mild (25-49%) plaque in the RCA, LAD and LCX. CAD-RADS 4.  Aortic atherosclerosis. 5.  Mild ascending aortic aneurysm.  4.2 cm. 6. Pulmonary artery is enlarged, suggestive of pulmonary hypertension.  FFR Results: FFRct findings are consistent with non-obstructive CAD.  Non-Cardiac Impressions: 1. Signs of pulmonary emphysema. 2. 6 mm spiculated new pulmonary nodule in the anterior RIGHT upper lobe, new since 2020. Noncontrast CT at 6 months is recommended. If the nodule is stable at time of repeat CT, then future CT at 18-24 months (from today's scan) is considered optional for low-risk patients, but is  recommended for high-risk patients. This recommendation follows the consensus statement: Guidelines for Management of Incidental Pulmonary Nodules Detected on CT Images: From the Fleischner Society 2017; Radiology 2017; 284:228-243. 3. Dilated main pulmonary artery, can be seen with pulmonary arterial hypertension.  EKG:  EKG ordered today. EKG personally reviewed and demonstrates normal sinus rhythm, 69 bpm, with no acute ST/T changes. Normal axis. Normal PR and QRS intervals. QTc 332 ms.  Recent Labs: 12/11/2021: BNP 181.6 12/21/2021: ALT 12; BUN 11; Creatinine, Ser 0.96; Hemoglobin 11.8; Magnesium 2.0; Platelets 197; Potassium 3.5; Sodium 138  Recent Lipid Panel    Component Value Date/Time   CHOL 125 12/21/2021 0255   CHOL 197 02/16/2014 0826   TRIG 73 12/21/2021 0255   HDL 41 12/21/2021 0255   HDL 52 02/16/2014 0826   CHOLHDL 3.0 12/21/2021 0255   VLDL 15 12/21/2021 0255   LDLCALC 69 12/21/2021 0255   LDLCALC 127 (H) 02/16/2014 0826    Physical Exam:    Vital Signs: BP 132/74   Pulse 68   Ht $R'5\' 4"'kF$  (1.626 m)   Wt 121 lb 12.8 oz (55.2 kg)   LMP  (LMP Unknown)   SpO2 93%   BMI 20.91 kg/m     Wt Readings from Last 3 Encounters:  01/14/22 121 lb 12.8 oz (55.2 kg)  12/21/21 121 lb 14.6 oz (55.3 kg)  12/11/21 122 lb (55.3 kg)     General: 81 y.o. thin Caucasian female in no acute distress. HEENT: Normocephalic and atraumatic. Sclera clear.  Neck: Supple. No JVD. Heart: Irregular rhythm with normal rate. Soft I-II/VI murmur noted. Distinct S1 and S2. No murmurs, gallops, or rubs.  Lungs: No increased work of breathing. Clear to ausculation bilaterally. No wheezes, rhonchi, or rales.  Abdomen: Soft, non-distended, and non-tender to palpation.  Extremities: No lower extremity edema.    Skin: Warm and dry. Neuro: Alert and oriented x3. No focal deficits. Psych: Normal affect. Responds appropriately.  Assessment:    1. Coronary artery disease involving native coronary  artery of native heart without angina pectoris   2. Non-ischemic cardiomyopathy ([place])   3. Chronic systolic congestive heart failure ([place])  4. Paroxysmal atrial fibrillation (HCC)   5. Severe aortic insufficiency   6. S/P AVR   7. Mitral valve insufficiency, unspecified etiology   8. Ascending aorta dilatation (HCC)   9. Primary hypertension   10. Hyperlipidemia, unspecified hyperlipidemia type   11. Pulmonary nodule     Plan:    Non-Obstructive CAD Recent coronary CTA on 01/01/2022 showed oronary calcium score of 76 (87th percentile for age and sex) with mild (25-49%) plaque in the RCA, LAD, and LCX (FFR negative). - No chest pain or recurrent left arm pain.  - No aspirin due to need for DOAC. - Continue beta-blocker and statin.   Dyspnea on Exertion Patient reported dyspnea on exertion at visit last month. BNP mildly elevated at 181.6 but she appeared euvolemic on exam. Recent Echo on 12/21/2021 showed LVEF of 60-65% with normal wall motion and grade 1 diastolic dysfunction. Coronary CTA showed only mild disease. - Much improved but still has some mild dyspnea on exertion with certain activities such as walking up the stairs. - No additional cardiac work-up necessary at this time.   Dizziness Patient has a history of intermittent dizziness. She reported an increased in dizziness at visit last month after she increased her dose of Sertraline on her own. However, this significantly improved since going back to lower dose of Sertraline. - She reports some mild dizziness today but overall improved. - Encouraged patient to stay well hydrated.  - She denies any palpitations so think we can hold of on cardiac monitor for now. However, advised patient to let us know if symptoms worsen.   Non-Ischemic Cardiomyopathy Chronic Systolic CHF LVEF as low as 35-40% in 2015 but has since normalized. Last Echo on 7//07/2021 showed LVEF of 60-65% with normal wall motion and grade 1 diastolic  dysfunction. - Euvolemic on exam. - Continue beta-blocker.    Paroxysmal Atrial Fibrillation Maintaining sinus rhythm on exam. - Previously on Coreg 9.$RemoveBe'375mg'EZLIHEqkb$  twice daily but PCP recent switched patient to Toprol-XL $RemoveBefo'25mg'DLDwNXelySA$  daily last week. Ok to continue Toprol-XL for now. - Also has Cardizem $RemoveBefo'30mg'ibLQzkIesVM$  PRN for rapid atrial fibrillation.  - Continue Eliquis $RemoveBeforeDE'5mg'lcvHUVaLOBFylDv$  twice daily.    Severe Aortic Insufficiency s/p AVR in 04/2019 Most recent Echo on 12/21/2021 showed stable AVR with no AS or AI noted.  - Continue SBE prophylaxis prior to dental procedures.    Mild Mitral Regurgitation Noted on Echo in 09/2021 but on trivial MR noted on Echo earlier this month.   Ascending Aorta Dilatation Recent coronary CTA on 01/01/2022 showed stable mild ascending aortic aneurysm measuring 4.2 cm. Unchanged from Echo in 09/2020. - Can continue to monitor with yearly Echo.   Hypertension BP well controlled today but patient states it is usually higher at home. Systolic BP is usually in the the 160s to 170s before taking any medications in the morning and then will improve to the 130s to 140s afterwards and then will start to increase again later in the day. - Previously on Coreg 9.$RemoveBe'375mg'JjWAkGUpw$  twice daily but PCP recently switched her to Toprol-XL $RemoveBefo'25mg'ocewZcMWWyF$  daily. She is scheduled to see PCP again tomorrow for follow-up of BP. I did discuss with patient that Coreg is usually better at controlling BP than Toprol-XL. She will discuss this with PCP.   Hyperlipidemia Most recent lipid panel on 12/21/2021: Total Cholesterol 125, Triglycerides 73, HDL 41, LDL 69. LDL goal <70 given CAD and stroke.  - Continue Lipitor $RemoveBeforeDE'20mg'kOFgACQGURFSKWE$  daily and Zetia $RemoveB'10mg'dZHGOBmU$  daily.  - Managed by PCP.   Pulmonary  Nodule A new spiculated pulmonary nodule was noted in anterior right upper lobe on recent coronary CTA. Patient has a remote smoking history. - Patient was advised to follow-up with Pulmonology. I sent Dr. Melvyn Novas a message as well.  Disposition: Follow up in 6  months.   Medication Adjustments/Labs and Tests Ordered: Current medicines are reviewed at length with the patient today.  Concerns regarding medicines are outlined above.  No orders of the defined types were placed in this encounter.  No orders of the defined types were placed in this encounter.   Patient Instructions  Medication Instructions:  Your physician recommends that you continue on your current medications as directed. Please refer to the Current Medication list given to you today.  *If you need a refill on your cardiac medications before your next appointment, please call your pharmacy*   Lab Work: NONE ordered at this time of appointment   If you have labs (blood work) drawn today and your tests are completely normal, you will receive your results only by: [place] (if you have MyChart) OR A paper copy in the mail If you have any lab test that is abnormal or we need to change your treatment, we will call you to review the results.   Testing/Procedures: NONE ordered at this time of appointment     Follow-Up: At [facility], you and your health needs are our priority.  As part of our continuing mission to provide you with exceptional heart care, we have created designated Provider Care Teams.  These Care Teams include your primary Cardiologist (physician) and Advanced Practice Providers (APPs -  Physician Assistants and Nurse Practitioners) who all work together to provide you with the care you need, when you need it.  We recommend signing up for the patient portal called "MyChart".  Sign up information is provided on this After Visit Summary.  MyChart is used to connect with patients for Virtual Visits (Telemedicine).  Patients are able to view lab/test results, encounter notes, upcoming appointments, etc.  Non-urgent messages can be sent to your provider as well.   To learn more about what you can do with MyChart, go to [email or web address].    Your next  appointment:   6 month(s)  The format for your next appointment:   In Person  Provider:   Quay Burow, MD     Other Instructions   Important Information About Sugar          Signed, Darreld Mclean, PA-C  01/14/2022 5:54 PM    [place]

## 2022-01-03 ENCOUNTER — Telehealth: Payer: Self-pay | Admitting: Student

## 2022-01-03 NOTE — Telephone Encounter (Signed)
   Coronary CTA yesterday showed only mild non-obstructive CAD but showed a new spiculated pulmonary nodule. I tried calling patient with results but was unable to reach her.  I sent her a MyChart message with the results as well. She follows with Dr. Melvyn Novas (Pulmonology) - recommend she call their office and schedule a follow-up visit to discuss this. I am working this weekend so will try to call her back tomorrow.  Darreld Mclean, PA-C 01/03/2022 2:29 PM

## 2022-01-05 ENCOUNTER — Encounter: Payer: Self-pay | Admitting: Internal Medicine

## 2022-01-05 DIAGNOSIS — R911 Solitary pulmonary nodule: Secondary | ICD-10-CM | POA: Insufficient documentation

## 2022-01-06 DIAGNOSIS — F419 Anxiety disorder, unspecified: Secondary | ICD-10-CM | POA: Diagnosis not present

## 2022-01-06 DIAGNOSIS — M791 Myalgia, unspecified site: Secondary | ICD-10-CM | POA: Diagnosis not present

## 2022-01-06 DIAGNOSIS — I10 Essential (primary) hypertension: Secondary | ICD-10-CM | POA: Diagnosis not present

## 2022-01-06 DIAGNOSIS — R42 Dizziness and giddiness: Secondary | ICD-10-CM | POA: Diagnosis not present

## 2022-01-07 DIAGNOSIS — I10 Essential (primary) hypertension: Secondary | ICD-10-CM | POA: Diagnosis not present

## 2022-01-13 ENCOUNTER — Encounter: Payer: Self-pay | Admitting: Student

## 2022-01-13 DIAGNOSIS — I428 Other cardiomyopathies: Secondary | ICD-10-CM | POA: Insufficient documentation

## 2022-01-14 ENCOUNTER — Ambulatory Visit: Payer: Medicare Other | Admitting: Student

## 2022-01-14 ENCOUNTER — Encounter: Payer: Self-pay | Admitting: Student

## 2022-01-14 VITALS — BP 132/74 | HR 68 | Ht 64.0 in | Wt 121.8 lb

## 2022-01-14 DIAGNOSIS — I5022 Chronic systolic (congestive) heart failure: Secondary | ICD-10-CM | POA: Diagnosis not present

## 2022-01-14 DIAGNOSIS — I480 Paroxysmal atrial fibrillation: Secondary | ICD-10-CM

## 2022-01-14 DIAGNOSIS — I428 Other cardiomyopathies: Secondary | ICD-10-CM | POA: Diagnosis not present

## 2022-01-14 DIAGNOSIS — I77810 Thoracic aortic ectasia: Secondary | ICD-10-CM

## 2022-01-14 DIAGNOSIS — I10 Essential (primary) hypertension: Secondary | ICD-10-CM

## 2022-01-14 DIAGNOSIS — E785 Hyperlipidemia, unspecified: Secondary | ICD-10-CM

## 2022-01-14 DIAGNOSIS — I2510 Atherosclerotic heart disease of native coronary artery without angina pectoris: Secondary | ICD-10-CM

## 2022-01-14 DIAGNOSIS — I351 Nonrheumatic aortic (valve) insufficiency: Secondary | ICD-10-CM

## 2022-01-14 DIAGNOSIS — R911 Solitary pulmonary nodule: Secondary | ICD-10-CM

## 2022-01-14 DIAGNOSIS — I340 Nonrheumatic mitral (valve) insufficiency: Secondary | ICD-10-CM

## 2022-01-14 DIAGNOSIS — Z952 Presence of prosthetic heart valve: Secondary | ICD-10-CM

## 2022-01-14 NOTE — Patient Instructions (Signed)
Medication Instructions:  Your physician recommends that you continue on your current medications as directed. Please refer to the Current Medication list given to you today.  *If you need a refill on your cardiac medications before your next appointment, please call your pharmacy*   Lab Work: NONE ordered at this time of appointment   If you have labs (blood work) drawn today and your tests are completely normal, you will receive your results only by: [place] (if you have MyChart) OR A paper copy in the mail If you have any lab test that is abnormal or we need to change your treatment, we will call you to review the results.   Testing/Procedures: NONE ordered at this time of appointment     Follow-Up: At [facility], you and your health needs are our priority.  As part of our continuing mission to provide you with exceptional heart care, we have created designated Provider Care Teams.  These Care Teams include your primary Cardiologist (physician) and Advanced Practice Providers (APPs -  Physician Assistants and Nurse Practitioners) who all work together to provide you with the care you need, when you need it.  We recommend signing up for the patient portal called "MyChart".  Sign up information is provided on this After Visit Summary.  MyChart is used to connect with patients for Virtual Visits (Telemedicine).  Patients are able to view lab/test results, encounter notes, upcoming appointments, etc.  Non-urgent messages can be sent to your provider as well.   To learn more about what you can do with MyChart, go to [email or web address].    Your next appointment:   6 month(s)  The format for your next appointment:   In Person  Provider:   Quay Burow, MD     Other Instructions   Important Information About Sugar

## 2022-01-15 DIAGNOSIS — H34212 Partial retinal artery occlusion, left eye: Secondary | ICD-10-CM | POA: Diagnosis not present

## 2022-01-15 DIAGNOSIS — E7800 Pure hypercholesterolemia, unspecified: Secondary | ICD-10-CM | POA: Diagnosis not present

## 2022-01-15 DIAGNOSIS — I10 Essential (primary) hypertension: Secondary | ICD-10-CM | POA: Diagnosis not present

## 2022-01-16 ENCOUNTER — Other Ambulatory Visit (INDEPENDENT_AMBULATORY_CARE_PROVIDER_SITE_OTHER): Payer: Medicare Other

## 2022-01-16 DIAGNOSIS — I2510 Atherosclerotic heart disease of native coronary artery without angina pectoris: Secondary | ICD-10-CM | POA: Diagnosis not present

## 2022-01-25 NOTE — Progress Notes (Signed)
Dana Pruitt, female    DOB: September 25, 1940     MRN: [id number]   Brief patient profile:   52  yowf MM/quit smoking 1992 previously followed by Dr Ashok Cordia with  GOLD II copd    PFT 01/04/17: FVC 2.17 L (79%) FEV1 1.32 L (64%) FEV1/FVC 0.61 FEF 25-75 0.57 L (34%) negative bronchodilator response TLC 5.23 L (105%) RV 136% ERV 39% DLCO corrected 43% 12/30/12: FVC 2.77 L (101%) FEV1 1.92 L (89%) FEV1/FVC 0.69 FEF 25-75 1.13 L (51%) negative bronchodilator response TLC 4.77 L (97%) RV 110% DLCO uncorrected 51%   6MWT 03/08/17:  Walked 414 meters / Baseline Sat 96% on RA / Nadir Sat 94% on RA (stopped with 4:37 due to poor pulse ox signal)     CARDIAC TTE (07/14/16):  LV normal in size with EF 60-65% & no regional wall motion abnormalities. Grade 1 diastolic dysfunction. LA & RA normal in size. RV mildly dilated with reduced systolic function. Moderate aortic regurgitation without stenosis. Ascending aorta is mildly dilated with aortic root normal in size. No mitral stenosis or regurgitation. Mild pulmonic regurgitation. No significant tricuspid regurgitation. No pericardial effusion.    Admit date: 01/31/2019 Discharge date: 02/10/2019   Admission Diagnoses: Severe aortic insufficiency   Discharge Diagnoses:  Active Problems:   Aortic valve disease     hx The patient is a 81 year old female referred to Dr. Harl Pruitt right for evaluation of severe aortic insufficiency.  She has been seen by her cardiologist for several years with serial echocardiogram.  Her most recent echo revealed severe AI with some LV dilatation and reduced LV systolic function.  She underwent right and left heart catheterization and was found to have no significant coronary artery disease.  Right heart catheterization showed normal pressures with mixed venous saturations of 68%.  LVEDP was normal and cardiac index was 2.9.  Pulmonary function studies were performed due to a history of smoking heavy smoking in the past,  although really remote.  Her FEV1 was 1.2 and DLCO was 40%.  A CT scan showed the ascending aorta measuring 4.0 cm.  There was findings of panlobular emphysema noted on the CT scan.  Dr. Darcey Pruitt evaluated the patient and all her studies and recommended proceeding with elective aortic valve replacement and she was admitted this hospitalization for the procedure.     Hospital Course: The patient was admitted electively and on 01/31/2019 taken to the operating room where she underwent Aortic valve replacement with a 21 mm Inspiris Edwards valve..  She tolerated it well and was taken to the surgical intensive care unit in stable condition.   Postoperative hospital course:   The patient has done well postoperatively.  She has remained neurologically intact.  She was weaned from the ventilator using standard protocols without difficulty.  She initially required some inotropic support with dobutamine but this was able to be weaned without difficulty.  She was also started on Midodrine to assist with blood pressure.  She is not felt to be a candidate at this time for ACE inhibitor or ARB.  She does have some postoperative volume overload and is responding to diuretics.  She has had postoperative atrial fibrillation and has been started on an amiodarone drip per usual protocol.  She was transitioned to p.o. amiodarone and subsequently was chemically cardioverted to sinus rhythm with only occasional runs of afib. Metoprolol was uptitrated and additionally she was started on Coumadin.  At the time of discharge the patient was very stable.  Home health arrangements were also made to have INR checked.  Additionally she did require home oxygen to be arranged with lower sats with ambulation in the qualifying range.  She has known severe COPD.        History of Present Illness  04/19/2019  Pulmonary/ 1st office eval/[name]  Chief Complaint  Patient presents with   New Patient (Initial Visit)    Seen in the past by  Dr Ashok Cordia. She states she has been doing cardiac rehab at [facility] and having issues with DOE and low o2 sats.   Dyspnea:  Was walking in hallways prior to d/c with sats 87% so rec 2lpm sleeping and walking but did not  Use it that way and in rehab same problem so started on 2lpm but has been  weaned to 1pm  At home does dog walking up and down hills,  Finds talking and walking are tough/ has not noticed hoarseness  Cough: no Sleep: flat  SABA use: none  - has not helped. rec Try off lisinopril for now  Start Micardis 80 mg one daily x 4 weeks  Make sure you check your oxygen saturations at highest level of activity to be sure it stays over 90% and adjust upward to maintain this level if needed but remember to turn it back to previous settings when you stop (to conserve your supply).        08/29/2019  f/u ov/Dana Pruitt re:  GOLD II/ ex hypoxemia / both covid shots last rx 3 weeks prior to [place] Complaint  Patient presents with   Follow-up    Breathing is much improved since the last visit. She does not have a rescue inhaler and would like to discuss getting one.   Dyspnea:  Does gxt  25mh x 30 min no longer any 02 / no elevation  Cough: minimal dry Sleeping: flat/ one pillow  SABA use: n/a 02: turned it back in  rec Plan A = Automatic = Always=    Stiolto 2 pffs each am  Plan B = Backup (to supplement plan A, not to replace it) Only use your albuterol inhaler as a rescue medication      09/15/2021  f/u ov/Dana Pruitt re: GOLD 2   maint on Trelegy 100 daily   Chief Complaint  Patient presents with   Follow-up    Pt states she has been doing okay since last visit and denies any complaints.  Dyspnea:  silver sneakers  3 x weekly, no more treadmill / not checking sats  Cough: minimal dry assoc  raspy voice ? Worse on dpi  Sleeping: flat bed/ one pillow  SABA use: none 02: none - no recent checks, no longer has 02 at all  Covid status:   vax x all of them  Rec Plan A = Automatic = Always=     breztri Take 2 puffs first thing in am and then another 2 puffs about 12 hours later.   Work on inhaler technique:    Plan B = Backup (to supplement plan A, not to replace it) Only use your albuterol inhaler as a rescue medication Plan C = Crisis (instead of Plan B but only if Plan B stops working) - only use your albuterol nebulizer if you first try Plan B and it fails to help        01/26/2022  f/u ov/Gershom Brobeck re: GOLD 2 copd/ spn     maint on trelegy 100   Chief Complaint  Patient presents with   Follow-up    Pt states needed f/u here per cardiology regarding 01/01/22 Coronary CT. Breathing is doing well and she denies any respiratory co's.   Dyspnea:  treadmill  3-4 per week up  to 30 min flat 2.5 mph Cough: no production  Sleeping: flat with one pillow s noct cc  SABA use: no hfa or neb  02: none   No obvious day to day or daytime variability or assoc excess/ purulent sputum or mucus plugs or hemoptysis or cp or chest tightness, subjective wheeze or overt sinus or hb symptoms.   Sleeping  without nocturnal  or early am exacerbation  of respiratory  c/o's or need for noct saba. Also denies any obvious fluctuation of symptoms with weather or environmental changes or other aggravating or alleviating factors except as outlined above   No unusual exposure hx or h/o childhood pna/ asthma or knowledge of premature birth.  Current Allergies, Complete Past Medical History, Past Surgical History, Family History, and Social History were reviewed in [employer] record.  ROS  The following are not active complaints unless bolded Hoarseness, sore throat, dysphagia, dental problems, itching, sneezing,  nasal congestion or discharge of excess mucus or purulent secretions, ear ache,   fever, chills, sweats, unintended wt loss or wt gain, classically pleuritic or exertional cp,  orthopnea pnd or arm/hand swelling  or leg swelling, presyncope, palpitations, abdominal pain, anorexia,  nausea, vomiting, diarrhea  or change in bowel habits or change in bladder habits, change in stools or change in urine, dysuria, hematuria,  rash, arthralgias, visual complaints, headache, numbness, weakness or ataxia or problems with walking or coordination,  change in mood or  memory.        Current Meds  Medication Sig   albuterol (PROAIR HFA) 108 (90 Base) MCG/ACT inhaler 2 puffs every 4 hours as needed only  if your can't catch your breath   apixaban (ELIQUIS) 5 MG TABS tablet TAKE 1 TABLET(5 MG) BY MOUTH TWICE DAILY   atorvastatin (LIPITOR) 20 MG tablet Take 20 mg by mouth daily.    denosumab (PROLIA) 60 MG/ML SOSY injection Inject 60 mg into the skin every 6 (six) months.   diltiazem (CARDIZEM) 30 MG tablet Take 1 tablet every 4 hours AS NEEDED for AFIB heart rate >100   fluticasone (FLONASE) 50 MCG/ACT nasal spray Place 2 sprays into both nostrils daily.   Fluticasone-Umeclidin-Vilant (TRELEGY ELLIPTA) 100-62.5-25 MCG/ACT AEPB Inhale 1 puff into the lungs daily.   guaiFENesin (MUCINEX) 600 MG 12 hr tablet Take 1 tablet (600 mg total) by mouth 2 (two) times daily.   metoprolol succinate (TOPROL-XL) 25 MG 24 hr tablet Take 50 mg by mouth daily.   sertraline (ZOLOFT) 100 MG tablet Take 100 mg by mouth daily.           Past Medical History:  Diagnosis Date   Anemia    "several times over the years" (10/17/2012)   Anxiety    Breast cancer ([place]) 02/2010   right breast   Carcinoma of breast treated with adjuvant hormone therapy ([place]) 9/11   [place]   CHF (congestive heart failure) ([facility])    COPD (chronic obstructive pulmonary disease) (H[facility])    Depression    Exertional shortness of breath    "recently" (10/17/2012)   GERD (gastroesophageal reflux disease)    diet controlled, no med   Hollenhorst plaque, left eye    apparently resolved at recent ophthalmology visit   Hyperlipidemia  Hypertension    IDC, RightStage II, 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   Left ventricular dysfunction     Moderate aortic insufficiency    Pneumonia    "that's why I'm here" (10/17/2012)       Objective:    Wts  01/26/2022          122  09/15/2021        126  09/04/2020        122  08/29/2019          126  05/31/19 125 lb (56.7 kg)  05/24/19 125 lb 10.6 oz (57 kg)  04/26/19 122 lb 9.6 oz (55.6 kg)    Vital signs reviewed  01/26/2022  - Note at rest 02 sats  96% on RA   General appearance:    pleasant hoarse amb wf nad   HEENT :  Oropharynx  clear  Nasal turbinates nl    NECK :  without JVD/Nodes/TM/ nl carotid upstrokes bilaterally   LUNGS: no acc muscle use,  Mod barrel  contour chest wall with bilateral  Distant bs s audible wheeze and  without cough on insp or exp maneuvers and mod  Hyperresonant  to  percussion bilaterally     CV:  RRR  no s3 or murmur or increase in P2, and no edema   ABD:  soft and nontender with pos mid insp Hoover's  in the supine position. No bruits or organomegaly appreciated, bowel sounds nl  MS:   Ext warm without deformities or   obvious joint restrictions , calf tenderness, cyanosis or clubbing  SKIN: warm and dry without lesions    NEURO:  alert, approp, nl sensorium with  no motor or cerebellar deficits apparent.               Assessment

## 2022-01-26 ENCOUNTER — Ambulatory Visit: Payer: Medicare Other | Admitting: Internal Medicine

## 2022-01-26 ENCOUNTER — Encounter: Payer: Self-pay | Admitting: Internal Medicine

## 2022-01-26 DIAGNOSIS — R911 Solitary pulmonary nodule: Secondary | ICD-10-CM | POA: Diagnosis not present

## 2022-01-26 DIAGNOSIS — J9611 Chronic respiratory failure with hypoxia: Secondary | ICD-10-CM

## 2022-01-26 DIAGNOSIS — J449 Chronic obstructive pulmonary disease, unspecified: Secondary | ICD-10-CM

## 2022-01-26 MED ORDER — BREZTRI AEROSPHERE 160-9-4.8 MCG/ACT IN AERO: 2.0000 | INHALATION_SPRAY | Freq: Two times a day (BID) | RESPIRATORY_TRACT | 0 refills | Status: DC

## 2022-01-26 MED ORDER — BREZTRI AEROSPHERE 160-9-4.8 MCG/ACT IN AERO: 2.0000 | INHALATION_SPRAY | Freq: Two times a day (BID) | RESPIRATORY_TRACT | 11 refills | Status: DC

## 2022-01-26 NOTE — Assessment & Plan Note (Signed)
Quit smoking 1992  01/04/17: FVC 2.17 L (79%) FEV1 1.32 L (64%) FEV1/FVC 0.61 FEF 25-75 0.57 L (34%) negative bronchodilator response TLC 5.23 L (105%) RV 136% ERV 39% DLCO corrected 43% - alpha one AT  03/08/17   MM  Level 164  - PFT's  01/05/2019  FEV1 1.30 (64 % ) ratio 0.62  p 6 % improvement from saba p ? prior to study with DLCO  7.27 (39%) corrects to 2.15 (52%)  for alv volume and FV curve classic concave pattern - 05/31/2019  After extensive coaching inhaler device,  effectiveness =    75% (delayed insp) with SMI > try stiolto 2 puffs each am > improved - 09/04/2020 changed to trelegy200 during  [place] > rec trelegy 100 maint    - 01/26/2022  After extensive coaching inhaler device,  effectiveness =    breztri 2 puffs each am    Group D (now reclassified as E) in terms of symptom/risk and laba/lama/ICS  therefore appropriate rx at this point >>>  trelegy ok but hoarseness is persistent even on 100 strength with gargling to rec trial of breztri initially just 2 puffs each am and if not improved hoarseness will need ent eval

## 2022-01-26 NOTE — Assessment & Plan Note (Signed)
Quit smoking 1992 - CT chest cuts on coronary study 01/01/22 new (from 2020)  35m mildly spiculated pulmonary nodule in the anterior right upper lobe> rec 6 m CT placed in reminder file for mid Jan 2024   CT results reviewed with pt >>> Too small for PET or bx, not suspicious enough for excisional bx > really only option for now is follow the Fleischner society guidelines as rec by radiology = f/u at 6 m   Discussed in detail all the  indications, usual  risks and alternatives  relative to the benefits with patient who agrees to proceed with w/u as outlined.            Each maintenance medication was reviewed in detail including emphasizing most importantly the difference between maintenance and prns and under what circumstances the prns are to be triggered using an action plan format where appropriate.  Total time for H and P, chart review, counseling, reviewing hfa device(s) and generating customized AVS unique to this office visit / same day charting =  36 min

## 2022-01-26 NOTE — Patient Instructions (Addendum)
Plan A = Automatic = Always=    stop trelegy and start breztri Take up to  2 puffs first thing in am and then another 2 puffs about 12 hours later( If needed)  Work on inhaler technique:  relax and gently blow all the way out then take a nice smooth full deep breath back in, triggering the inhaler at same time you start breathing in.  Hold breath in for at least  5 seconds if you can. Blow out breztri thru nose. Rinse and gargle with water when done.  If mouth or throat bother you at all,  try brushing teeth/gums/tongue with arm and hammer toothpaste/ make a slurry and gargle and spit out.      Plan B = Backup (to supplement plan A, not to replace it) Only use your albuterol inhaler as a rescue medication to be used if you can't catch your breath by resting or doing a relaxed purse lip breathing pattern.  - The less you use it, the better it will work when you need it. - Ok to use the inhaler up to 2 puffs  every 4 hours if you must but call for appointment if use goes up over your usual need - Don't leave home without it !!  (think of it like the spare tire for your car)   We will put you in a computer for recall on Jul 04 2022 for repeat CT chest    Please schedule a follow up visit in 3 months but call sooner if needed

## 2022-01-30 DIAGNOSIS — I10 Essential (primary) hypertension: Secondary | ICD-10-CM | POA: Diagnosis not present

## 2022-02-04 DIAGNOSIS — J449 Chronic obstructive pulmonary disease, unspecified: Secondary | ICD-10-CM | POA: Diagnosis not present

## 2022-02-04 DIAGNOSIS — M810 Age-related osteoporosis without current pathological fracture: Secondary | ICD-10-CM | POA: Diagnosis not present

## 2022-02-04 DIAGNOSIS — Z0000 Encounter for general adult medical examination without abnormal findings: Secondary | ICD-10-CM | POA: Diagnosis not present

## 2022-02-04 DIAGNOSIS — I10 Essential (primary) hypertension: Secondary | ICD-10-CM | POA: Diagnosis not present

## 2022-02-09 ENCOUNTER — Encounter: Payer: Self-pay | Admitting: Student

## 2022-02-09 NOTE — Telephone Encounter (Signed)
Error

## 2022-02-10 ENCOUNTER — Encounter: Payer: Self-pay | Admitting: Nurse Practitioner

## 2022-02-10 ENCOUNTER — Ambulatory Visit: Payer: Medicare Other

## 2022-02-10 ENCOUNTER — Ambulatory Visit: Payer: Medicare Other | Admitting: Nurse Practitioner

## 2022-02-10 VITALS — BP 112/64 | HR 70 | Ht 64.0 in | Wt 123.0 lb

## 2022-02-10 DIAGNOSIS — I5022 Chronic systolic (congestive) heart failure: Secondary | ICD-10-CM

## 2022-02-10 DIAGNOSIS — R0609 Other forms of dyspnea: Secondary | ICD-10-CM

## 2022-02-10 DIAGNOSIS — E785 Hyperlipidemia, unspecified: Secondary | ICD-10-CM

## 2022-02-10 DIAGNOSIS — I2510 Atherosclerotic heart disease of native coronary artery without angina pectoris: Secondary | ICD-10-CM | POA: Diagnosis not present

## 2022-02-10 DIAGNOSIS — I480 Paroxysmal atrial fibrillation: Secondary | ICD-10-CM

## 2022-02-10 DIAGNOSIS — R55 Syncope and collapse: Secondary | ICD-10-CM

## 2022-02-10 DIAGNOSIS — R911 Solitary pulmonary nodule: Secondary | ICD-10-CM

## 2022-02-10 DIAGNOSIS — I351 Nonrheumatic aortic (valve) insufficiency: Secondary | ICD-10-CM

## 2022-02-10 DIAGNOSIS — R42 Dizziness and giddiness: Secondary | ICD-10-CM

## 2022-02-10 DIAGNOSIS — I10 Essential (primary) hypertension: Secondary | ICD-10-CM | POA: Diagnosis not present

## 2022-02-10 DIAGNOSIS — I340 Nonrheumatic mitral (valve) insufficiency: Secondary | ICD-10-CM

## 2022-02-10 DIAGNOSIS — Z952 Presence of prosthetic heart valve: Secondary | ICD-10-CM

## 2022-02-10 DIAGNOSIS — I428 Other cardiomyopathies: Secondary | ICD-10-CM

## 2022-02-10 DIAGNOSIS — I77810 Thoracic aortic ectasia: Secondary | ICD-10-CM

## 2022-02-10 MED ORDER — CARVEDILOL 6.25 MG PO TABS: 9.3750 mg | ORAL_TABLET | Freq: Two times a day (BID) | ORAL | 3 refills | Status: DC

## 2022-02-10 NOTE — Progress Notes (Unsigned)
Enrolled for [employer] to mail a ZIO AT Live Telemetry monitor to patients address on file.   Dr. Berry to read. 

## 2022-02-10 NOTE — Patient Instructions (Addendum)
Medication Instructions:  STOP Meoprolol START Carvedilol 9.375 mg twice daily    *If you need a refill on your cardiac medications before your next appointment, please call your pharmacy*   Lab Work: NONE ordered at this time of appointment   If you have labs (blood work) drawn today and your tests are completely normal, you will receive your results only by: [place] (if you have MyChart) OR A paper copy in the mail If you have any lab test that is abnormal or we need to change your treatment, we will call you to review the results.   Testing/Procedures: [place] [place]- Long Term Monitor Instructions  Your physician has requested you wear a ZIO patch monitor for 14 days.  This is a single patch monitor. [employer] supplies one patch monitor per enrollment. Additional stickers are not available. Please do not apply patch if you will be having a Nuclear Stress Test,  Echocardiogram, Cardiac CT, MRI, or Chest Xray during the period you would be wearing the  monitor. The patch cannot be worn during these tests. You cannot remove and re-apply the  ZIO XT patch monitor.  Your ZIO patch monitor will be mailed 3 day USPS to your address on file. It may take 3-5 days  to receive your monitor after you have been enrolled.  Once you have received your monitor, please review the enclosed instructions. Your monitor  has already been registered assigning a specific monitor serial # to you.  Billing and Patient Assistance Program Information  We have supplied [employer] with any of your insurance information on file for billing purposes. [employer] offers a sliding scale Patient Assistance Program for patients that do not have  insurance, or whose insurance does not completely cover the cost of the ZIO monitor.  You must apply for the Patient Assistance Program to qualify for this discounted rate.  To apply, please call [employer] at [phone], select option 4, select option 2, ask to apply for   Patient Assistance Program. Theodore Demark will ask your household income, and how many people  are in your household. They will quote your out-of-pocket cost based on that information.  [employer] will also be able to set up a 51-month interest-free payment plan if needed.  Applying the monitor   Shave hair from upper left chest.  Hold abrader disc by orange tab. Rub abrader in 40 strokes over the upper left chest as  indicated in your monitor instructions.  Clean area with 4 enclosed alcohol pads. Let dry.  Apply patch as indicated in monitor instructions. Patch will be placed under collarbone on left  side of chest with arrow pointing upward.  Rub patch adhesive wings for 2 minutes. Remove white label marked "1". Remove the white  label marked "2". Rub patch adhesive wings for 2 additional minutes.  While looking in a mirror, press and release button in center of patch. A small green light will  flash 3-4 times. This will be your only indicator that the monitor has been turned on.  Do not shower for the first 24 hours. You may shower after the first 24 hours.  Press the button if you feel a symptom. You will hear a small click. Record Date, Time and  Symptom in the Patient Logbook.  When you are ready to remove the patch, follow instructions on the last 2 pages of Patient  Logbook. Stick patch monitor onto the last page of Patient Logbook.  Place Patient Logbook in the blue and white box.  Use locking tab on box and tape box closed  securely. The blue and white box has prepaid postage on it. Please place it in the mailbox as  soon as possible. Your physician should have your test results approximately 7 days after the  monitor has been mailed back to [facility].  Call [facility] at [phone] if you have questions regarding  your ZIO XT patch monitor. Call them immediately if you see an orange light blinking on your  monitor.  If your monitor falls off in less than 4  days, contact our Monitor department at [phone].  If your monitor becomes loose or falls off after 4 days call [employer] at [phone] for  suggestions on securing your monitor    Follow-Up: At [facility], you and your health needs are our priority.  As part of our continuing mission to provide you with exceptional heart care, we have created designated Provider Care Teams.  These Care Teams include your primary Cardiologist (physician) and Advanced Practice Providers (APPs -  Physician Assistants and Nurse Practitioners) who all work together to provide you with the care you need, when you need it.  We recommend signing up for the patient portal called "MyChart".  Sign up information is provided on this After Visit Summary.  MyChart is used to connect with patients for Virtual Visits (Telemedicine).  Patients are able to view lab/test results, encounter notes, upcoming appointments, etc.  Non-urgent messages can be sent to your provider as well.   To learn more about what you can do with MyChart, go to [email or web address].    Your next appointment:   6-8 week(s)  The format for your next appointment:   In Person  Provider:   Diona Browner, NP        Other Instructions Monitor Blood pressure. Report blood pressure consistently greater than 130/80 or less than 100.   Important Information About Sugar

## 2022-02-10 NOTE — Progress Notes (Signed)
Office Visit    Patient Name: Dana Pruitt Date of Encounter: 02/10/2022  Primary Care Provider:  Deland Pretty, MD Primary Cardiologist:  Quay Burow, MD  Chief Complaint    81 year old female with a history of mild nonobstructive CAD, severe aortic insufficiency s/p AVR in 04/2019, mitral valve regurgitation, chronic systolic heart failure with subsequent normalization of EF, NICM, paroxysmal atrial fibrillation on Eliquis, CVA in 12/2021, hypertension, hyperlipidemia, COPD, GERD, anxiety, depression, and breast cancer s/p chemotherapy/radiation and lumpectomy in 2013 who presents for follow-up related to hypertension.  Past Medical History    Past Medical History:  Diagnosis Date   Anemia    "several times over the years" (10/17/2012)   Anxiety    Breast cancer ([place]) 02/2010   right breast   CAD (coronary artery disease)    a. LHC 12/2018: mild non-obstructive CAD   Carcinoma of breast treated with adjuvant hormone therapy ([place]) 02/2010   Femara   Chronic systolic CHF    a. Echo in 2015: LVEF of 35-40%, b. Echo in 12/2021: LVEF of 60-65%   COPD (chronic obstructive pulmonary disease) (HCC)    Depression    GERD (gastroesophageal reflux disease)    diet controlled, no med   Hollenhorst plaque, left eye    apparently resolved at recent ophthalmology visit   Hyperlipidemia    Hypertension    IDC, RightStage II, 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   Moderate aortic insufficiency    Non-ischemic cardiomyopathy (HCC)    Paroxysmal atrial fibrillation (HCC)    Pneumonia    "that's why I'm here" (10/17/2012)   S/P AVR (aortic valve replacement) 04/26/2019   Stroke ([place])    12/2021   Past Surgical History:  Procedure Laterality Date   AORTIC VALVE REPLACEMENT N/A 01/31/2019   Procedure: AORTIC VALVE REPLACEMENT (AVR) USING 21 MM INSPIRIS RESILIS AORTIC VALVE. SN: [id number];  Surgeon: Prescott Gum, Collier Salina, MD;  Location: [facility];  Service: Open Heart Surgery;  Laterality: N/A;    APPENDECTOMY  1974   BREAST BIOPSY Right 02/27/10   Needle core Biopsy; Invasive Mammary; ER/PR Positive, Her-2 Neu negative, Ki-67 22%   BREAST LUMPECTOMY WITH SENTINEL LYMPH NODE BIOPSY Right 07/03/11   Invasive Ductal Carcinoma;0/3 nodes negative,; ER,PR Positive, Her-2 Neg; Ki-67 22%   CESAREAN SECTION  1970; 1974   COLONOSCOPY     DILATION AND CURETTAGE OF UTERUS  1970's   RIGHT/LEFT HEART CATH AND CORONARY ANGIOGRAPHY N/A 01/02/2019   Procedure: RIGHT/LEFT HEART CATH AND CORONARY ANGIOGRAPHY;  Surgeon: Larey Dresser, MD;  Location: [facility];  Service: Cardiovascular;  Laterality: N/A;   TEE WITHOUT CARDIOVERSION N/A 01/31/2019   Procedure: TRANSESOPHAGEAL ECHOCARDIOGRAM (TEE);  Surgeon: Prescott Gum, Collier Salina, MD;  Location: [facility];  Service: Open Heart Surgery;  Laterality: N/A;   TUBAL LIGATION  1974   WISDOM TOOTH EXTRACTION      Allergies  No Known Allergies  History of Present Illness    80 year old female with the above past medical history including mild nonobstructive CAD, severe aortic insufficiency s/p AVR in 04/2019, mitral valve regurgitation, chronic systolic heart failure with subsequent normalization of EF, NICM, paroxysmal atrial fibrillation on Eliquis, CVA in 12/2021, hypertension, hyperlipidemia, COPD, GERD, anxiety, depression, and breast cancer s/p chemotherapy/radiation and lumpectomy in 2013.  She was referred to Dr. [name] in 2015 after echocardiogram revealed EF 35 to 40% with moderate aortic insufficiency.  She was started on a beta-blocker for cardiomyopathy and EF eventually normalized to 60 to 65% on repeat  echo in 2018.  Aortic disease progressed and echo in 09/2018 showed EF 40 to 45%, diffuse hypokinesis, G1 DD, severe AI.  She was hospitalized in July 2020 with chest pain and shortness of breath.  She underwent R/LHC which revealed nonobstructive CAD, wide aortic pulse pressure suggestive of significant AI, preserved cardiac output, and low filling  pressures.  She ultimately underwent AVR with 21 mm Inspiris Edwards valve in 01/2019.  Did develop postop atrial fibrillation but later converted to sinus rhythm following initiation of amiodarone.  She was discharged on amiodarone and Coumadin both of which were later discontinued. Echocardiogram in 09/2019 showed EF 55 to 60%, well-functioning AVR.  She was evaluated in the A-fib clinic in 02/2020 and was started on Eliquis due to recurrent episodes of atrial fibrillation. Carvedilol was decreased due to dizziness.  She complained of left shoulder pain with an associated nausea and near syncope as well as several episodes of dizziness.  She underwent Myoview which was low risk, no evidence of ischemia.  Event monitor was recommended but never pursued to improvement in dizziness, no recurrent presyncope.  Routine echocardiogram in April 2022 showed EF 65%, G1 DD, well-functioning aortic bioprosthesis, mild MR, and mild dilation of ascending aorta, 42 mm.  She was evaluated in the office in June 2023 and reported exertional dyspnea, increased dizziness which seem to correlate with self, increased dosing of sertraline. Symptoms resolved after resuming previous dose of Sertraline.  Coronary CTA was recommended, however, before this could be performed she was hospitalized in the setting of acute ischemic stroke.  MRI showed acute nonhemorrhagic infarct of the left occipital lobe.  Showed EF 65%, normal wall motion, G1 DD, stable AVR, mild dilation of ascending aorta measuring 43 mm.  Neurology recommended continuing Eliquis.  Coronary CTA showed calcium score of 709 (87 percentile for age and sex matched control), mild plaque in the RCA, LAD, and LCx (FFR negative), as well as 4.2 cm ascending aortic aneurysm (stable from prior imaging), signs  (follow-up with pulmonology was recommended).    She was last seen in the office on 01/14/2022 and was stable from a cardiac standpoint. She reported external exertional dyspnea,  however, she declined any further chest discomfort. She did note occasional lightheadedness.  Her PCP transitioned her from carvedilol to Toprol one week prior. She presents today for follow-up. Since her last visit she notes ongoing elevated BP as well as a few episodes of dizziness, presyncope. She denies palpitations, dyspnea, denies symptoms concerning for angina. BP has ranged from the 140s-170s SBP. Her PCP recently increased her metoprolol.  Other than her ongoing elevated BP and dizziness/presyncope, she denies any additional concerns today.  Home Medications    Current Outpatient Medications  Medication Sig Dispense Refill   albuterol (PROAIR HFA) 108 (90 Base) MCG/ACT inhaler 2 puffs every 4 hours as needed only  if your can't catch your breath     apixaban (ELIQUIS) 5 MG TABS tablet TAKE 1 TABLET(5 MG) BY MOUTH TWICE DAILY 180 tablet 1   atorvastatin (LIPITOR) 20 MG tablet Take 20 mg by mouth daily.      Budeson-Glycopyrrol-Formoterol (BREZTRI AEROSPHERE) 160-9-4.8 MCG/ACT AERO Inhale 2 puffs into the lungs 2 (two) times daily. 10.7 g 11   carvedilol (COREG) 6.25 MG tablet Take 1.5 tablets (9.375 mg total) by mouth 2 (two) times daily with a meal. 45 tablet 3   denosumab (PROLIA) 60 MG/ML SOSY injection Inject 60 mg into the skin every 6 (six) months.  diltiazem (CARDIZEM) 30 MG tablet Take 1 tablet every 4 hours AS NEEDED for AFIB heart rate >100 45 tablet 1   fluticasone (FLONASE) 50 MCG/ACT nasal spray Place 2 sprays into both nostrils daily. 16 g 3   guaiFENesin (MUCINEX) 600 MG 12 hr tablet Take 1 tablet (600 mg total) by mouth 2 (two) times daily. 30 tablet 0   sertraline (ZOLOFT) 100 MG tablet Take 100 mg by mouth daily.     No current facility-administered medications for this visit.   Facility-Administered Medications Ordered in Other Visits  Medication Dose Route Frequency Provider Last Rate Last Admin   sodium chloride flush (NS) 0.9 % injection 3 mL  3 mL Intravenous  Q12H Larey Dresser, MD         Review of Systems    She denies chest pain, palpitations, dyspnea, pnd, orthopnea, n, v, dizziness, syncope, edema, weight gain, or early satiety. All other systems reviewed and are otherwise negative except as noted above.   Physical Exam    VS:  BP 112/64   Pulse 70   Ht _0  (1.626 m)   Wt 123 lb (55.8 kg)   LMP  (LMP Unknown)   SpO2 92%   BMI 21.11 kg/m   GEN: Well nourished, well developed, in no acute distress. HEENT: normal. Neck: Supple, no JVD, carotid bruits, or masses. Cardiac: RRR, no murmurs, rubs, or gallops. No clubbing, cyanosis, edema.  Radials/DP/PT 2+ and equal bilaterally.  Respiratory:  Respirations regular and unlabored, clear to auscultation bilaterally. GI: Soft, nontender, nondistended, BS + x 4. MS: no deformity or atrophy. Skin: warm and dry, no rash. Neuro:  Strength and sensation are intact. Psych: Normal affect.  Accessory Clinical Findings    ECG personally reviewed by me today - No EKG in office today.     Lab Results  Component Value Date   WBC 4.5 12/21/2021   HGB 11.8 (L) 12/21/2021   HCT 35.0 (L) 12/21/2021   MCV 91.6 12/21/2021   PLT 197 12/21/2021   Lab Results  Component Value Date   CREATININE 0.96 12/21/2021   BUN 11 12/21/2021   NA 138 12/21/2021   K 3.5 12/21/2021   CL 109 12/21/2021   CO2 22 12/21/2021   Lab Results  Component Value Date   ALT 12 12/21/2021   AST 13 (L) 12/21/2021   ALKPHOS 30 (L) 12/21/2021   BILITOT 0.7 12/21/2021   Lab Results  Component Value Date   CHOL 125 12/21/2021   HDL 41 12/21/2021   LDLCALC 69 12/21/2021   TRIG 73 12/21/2021   CHOLHDL 3.0 12/21/2021    Lab Results  Component Value Date   HGBA1C 5.9 (H) 12/20/2021    Assessment & Plan    1. Dizziness/presyncope: She has a longstanding history of intermittent dizziness/lightheadedness.  She does note over the past several months a few episodes of presyncope.  She denies any syncope,  palpitations, dyspnea, denies symptoms concerning for angina. Orthostatics negative in office today. Recent coronary CTA as below, recent echo stable. Will check 14 day live Zio. Continue to monitor symptoms. Discussed ED precautions, patient verbalized understanding.  2. Hypertension: BP somewhat labile. Will stop metoprolol and re-start carvedilol 9.375 mg bid (may need to titrate dose). Cotninue to monitor BP and report BP consistently > 130/80 or SBP consistently < 110.   3. Nonobstructive CAD: Coronary CTA in July 2023 showed coronary calcium score of 709 (87th percentile for age and sex matched control), plaque in  the RCA, LAD, and left circumflex arteries (FFR negative). Stable with no anginal symptoms. No indication for ischemic evaluation. No aspirin due to chronic DOAC therapy.  Continue metoprolol, Lipitor.  4. Dyspnea on exertion/NICM/chronic systolic heart failure: EF prior EF 35 to 40% in 2015.  Most recent echo in July 2023 showed EF 65%, normal wall motion, G1 DD.  Recent coronary CTA as above. Denies any recent dyspnea. Euvolemic and well compensated on exam.   Continue carvedilol as above.  5. Paroxysmal atrial fibrillation: Appears to be maintaining NSR on auscultation.  Continue carvedilol, Eliquis, prn diltiazem.  6. Severe aortic insufficiency s/p AVR: Most recent echo as above with stable AVR, no AAS or AI noted.  Continue SBE prophylaxis prior to dental procedures.  7. Mild mitral valve regurgitation: Trivial on most recent echo.  8. Ascending aorta dilation: Recent coronary CTA in July 2023 showed stable mild ascending aortic aneurysm measuring 4.2 cm, unchanged from prior echo in 09/2020.  Continue to monitor with yearly echocardiogram.  9. Hyperlipidemia: LDL was 69 in 12/2021.  Continue Lipitor, Zetia.  Monitored and managed per PCP.  10. Pulmonary nodule: Follow-up with pulmonology.  11. Disposition: Follow-up in 6-8 weeks.   Lenna Sciara, NP 02/10/2022, 4:28 PM

## 2022-02-17 NOTE — Telephone Encounter (Signed)
Noted.  See other phone note.

## 2022-02-17 NOTE — Telephone Encounter (Signed)
Spoke with pt. Pt would like to know if the monitor is necessary at this time due to the $800.00 out of pocket fee. Pt has a high deductible and hasn't used any of it this year? Pt also mentioned that her BP has been 157/95, 175/73, 193/109, 164/94 ect. Pt feels tired most days, feels lightheadedness that last briefly. Pt is taking her BP in the morning before she takes her medication and in the evening. Please advise.

## 2022-02-18 NOTE — Telephone Encounter (Signed)
Spoke with pt. Pt is aware that if she hasn't had any new syncope or syncope episodes she can hold off on the monitor. If symptoms start back, pt is aware that she should go to the ED. When pt took her BP today 2 hours after taking her medication, her BP was normal. Pt would like to monitor BP for 1-2 weeks before increasing carvedilol, since it was normal after taking her BP today. Diona Browner DNP is aware of pts request and pt will follow up as planned.

## 2022-02-24 ENCOUNTER — Ambulatory Visit: Payer: Medicare Other | Admitting: Neurology

## 2022-02-24 ENCOUNTER — Encounter: Payer: Self-pay | Admitting: Neurology

## 2022-02-24 VITALS — BP 152/90 | HR 73 | Ht 64.0 in | Wt 123.2 lb

## 2022-02-24 DIAGNOSIS — I63412 Cerebral infarction due to embolism of left middle cerebral artery: Secondary | ICD-10-CM | POA: Diagnosis not present

## 2022-02-24 DIAGNOSIS — Z8679 Personal history of other diseases of the circulatory system: Secondary | ICD-10-CM

## 2022-02-24 NOTE — Patient Instructions (Signed)
I had a long d/w patient about his recent embolic stroke, history of constant atrial fibrillation cardiac surgery, risk for recurrent stroke/TIAs, personally independently reviewed imaging studies and stroke evaluation results and answered questions.Continue Eliquis (apixaban) daily  for secondary stroke prevention and maintain strict control of hypertension with blood pressure goal below 130/90, diabetes with hemoglobin A1c goal below 6.5% and lipids with LDL cholesterol goal below 70 mg/dL. I also advised the patient to eat a healthy diet with plenty of whole grains, cereals, fruits and vegetables, exercise regularly and maintain ideal body weight Followup in the future with me in 6 months or call earlier if necessary.  Stroke Prevention Some medical conditions and behaviors can lead to a higher chance of having a stroke. You can help prevent a stroke by eating healthy, exercising, not smoking, and managing any medical conditions you have. Stroke is a leading cause of functional impairment. Primary prevention is particularly important because a majority of strokes are first-time events. Stroke changes the lives of not only those who experience a stroke but also their family and other caregivers. How can this condition affect me? A stroke is a medical emergency and should be treated right away. A stroke can lead to brain damage and can sometimes be life-threatening. If a person gets medical treatment right away, there is a better chance of surviving and recovering from a stroke. What can increase my risk? The following medical conditions may increase your risk of a stroke: Cardiovascular disease. High blood pressure (hypertension). Diabetes. High cholesterol. Sickle cell disease. Blood clotting disorders (hypercoagulable state). Obesity. Sleep disorders (obstructive sleep apnea). Other risk factors include: Being older than age 57. Having a history of blood clots, stroke, or mini-stroke  (transient ischemic attack, TIA). Genetic factors, such as race, ethnicity, or a family history of stroke. Smoking cigarettes or using other tobacco products. Taking birth control pills, especially if you also use tobacco. Heavy use of alcohol or drugs, especially cocaine and methamphetamine. Physical inactivity. What actions can I take to prevent this? Manage your health conditions High cholesterol levels. Eating a healthy diet is important for preventing high cholesterol. If cholesterol cannot be managed through diet alone, you may need to take medicines. Take any prescribed medicines to control your cholesterol as told by your health care provider. Hypertension. To reduce your risk of stroke, try to keep your blood pressure below 130/80. Eating a healthy diet and exercising regularly are important for controlling blood pressure. If these steps are not enough to manage your blood pressure, you may need to take medicines. Take any prescribed medicines to control hypertension as told by your health care provider. Ask your health care provider if you should monitor your blood pressure at home. Have your blood pressure checked every year, even if your blood pressure is normal. Blood pressure increases with age and some medical conditions. Diabetes. Eating a healthy diet and exercising regularly are important parts of managing your blood sugar (glucose). If your blood sugar cannot be managed through diet and exercise, you may need to take medicines. Take any prescribed medicines to control your diabetes as told by your health care provider. Get evaluated for obstructive sleep apnea. Talk to your health care provider about getting a sleep evaluation if you snore a lot or have excessive sleepiness. Make sure that any other medical conditions you have, such as atrial fibrillation or atherosclerosis, are managed. Nutrition Follow instructions from your health care provider about what to eat or drink  to help manage  your health condition. These instructions may include: Reducing your daily calorie intake. Limiting how much salt (sodium) you use to 1,500 milligrams (mg) each day. Using only healthy fats for cooking, such as olive oil, canola oil, or sunflower oil. Eating healthy foods. You can do this by: Choosing foods that are high in fiber, such as whole grains, and fresh fruits and vegetables. Eating at least 5 servings of fruits and vegetables a day. Try to fill one-half of your plate with fruits and vegetables at each meal. Choosing lean protein foods, such as lean cuts of meat, poultry without skin, fish, tofu, beans, and nuts. Eating low-fat dairy products. Avoiding foods that are high in sodium. This can help lower blood pressure. Avoiding foods that have saturated fat, trans fat, and cholesterol. This can help prevent high cholesterol. Avoiding processed and prepared foods. Counting your daily carbohydrate intake.  Lifestyle If you drink alcohol: Limit how much you have to: 0-1 drink a day for women who are not pregnant. 0-2 drinks a day for men. Know how much alcohol is in your drink. In the [place] one drink equals one 12 oz bottle of beer (370m), one 5 oz glass of wine (1450m, or one 1 oz glass of hard liquor (4435m Do not use any products that contain nicotine or tobacco. These products include cigarettes, chewing tobacco, and vaping devices, such as e-cigarettes. If you need help quitting, ask your health care provider. Avoid secondhand smoke. Do not use drugs. Activity  Try to stay at a healthy weight. Get at least 30 minutes of exercise on most days, such as: Fast walking. Biking. Swimming. Medicines Take over-the-counter and prescription medicines only as told by your health care provider. Aspirin or blood thinners (antiplatelets or anticoagulants) may be recommended to reduce your risk of forming blood clots that can lead to stroke. Avoid taking birth control  pills. Talk to your health care provider about the risks of taking birth control pills if: You are over 35 39ars old. You smoke. You get very bad headaches. You have had a blood clot. Where to find more information American Stroke Association: www.strokeassociation.org Get help right away if: You or a loved one has any symptoms of a stroke. "BE FAST" is an easy way to remember the main warning signs of a stroke: B - Balance. Signs are dizziness, sudden trouble walking, or loss of balance. E - Eyes. Signs are trouble seeing or a sudden change in vision. F - Face. Signs are sudden weakness or numbness of the face, or the face or eyelid drooping on one side. A - Arms. Signs are weakness or numbness in an arm. This happens suddenly and usually on one side of the body. S - Speech. Signs are sudden trouble speaking, slurred speech, or trouble understanding what people say. T - Time. Time to call emergency services. Write down what time symptoms started. You or a loved one has other signs of a stroke, such as: A sudden, severe headache with no known cause. Nausea or vomiting. Seizure. These symptoms may represent a serious problem that is an emergency. Do not wait to see if the symptoms will go away. Get medical help right away. Call your local emergency services (911 in the [place] Do not drive yourself to the hospital. Summary You can help to prevent a stroke by eating healthy, exercising, not smoking, limiting alcohol intake, and managing any medical conditions you may have. Do not use any products that contain nicotine or tobacco. These include  cigarettes, chewing tobacco, and vaping devices, such as e-cigarettes. If you need help quitting, ask your health care provider. Remember "BE FAST" for warning signs of a stroke. Get help right away if you or a loved one has any of these signs. This information is not intended to replace advice given to you by your health care provider. Make sure you  discuss any questions you have with your health care provider. Document Revised: 01/08/2020 Document Reviewed: 01/08/2020 Elsevier Patient Education  [place].

## 2022-02-24 NOTE — Progress Notes (Signed)
[facility] [address] [place]. [place] [address] [phone]       OFFICE CONSULT NOTE  Dana Pruitt Date of Birth:  [birth year] Medical Record Number:  [id number]   Referring MD: Fayrene Helper  Reason for Referral: Stroke  HPI: Ms. Dana Pruitt is a pleasant 81 year old Caucasian lady seen today for initial office consultation visit for stroke.  History is obtained from the patient and review of electronic medical records and opossum reviewed pertinent available imaging films in PACS.Dana Pruitt is a 81 y.o. 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  Symptoms lasted only 10 to 15 minutes and she was able to sit in a car was able to drive home after she improved.  She She took her to the emergency room.  He was/for further work-up and MRI scan showed a small punctate left parieto-occipital infarct.  CT angiogram showed mild to moderate left M2/M3 stenosis.  Echocardiogram showed ejection fraction of 60 to 65% without evidence of embolism.  LDL cholesterol was 69 mg percent and hemoglobin A1c was 5.9.  Patient was advised for outpatient cardiac monitoring and Zio patch but she refused it since she has prior history of transient atrial fibrillation following aortic valve replacement in the remote past as well as had history of asymptomatic Hollenhorst plaque in her eyes in October 2015 it was felt to most likely have paroxysmal A-fib is on anticoagulation with Eliquis.  At that time 2D echo, carotid ultrasound and stroke work-up were unremarkable.  He is tolerating it well without bleeding or bruising.  He states  blood pressures under good control greater than 150/90 he is tolerating Lipitor well without muscle aches and pains.  She still occasionally gets lightheaded has no other complaints.  She has remote history of breast cancer status postlumpectomy and radiation in 2013 presently in remission  ROS:   14 system review of systems is positive for dizziness, disorientation all other systems negative  PMH:  Past Medical History:  Diagnosis Date   Anemia    "several times over the years" (10/17/2012)   Anxiety    Breast cancer ([employer]) 02/2010   right breast   CAD (coronary artery disease)    a. LHC 12/2018: mild non-obstructive CAD   Carcinoma of breast treated with adjuvant hormone therapy ([facility]) 02/2010   Femara   Chronic systolic CHF    a. Echo in 2015: LVEF of 35-40%, b. Echo in 12/2021: LVEF of 60-65%   COPD (chronic obstructive pulmonary disease) (HCC)    Depression    GERD (gastroesophageal reflux disease)    diet controlled, no med   Hollenhorst plaque, left eye    apparently resolved at recent ophthalmology visit   Hyperlipidemia    Hypertension    IDC, RightStage II, 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   Moderate aortic insufficiency    Non-ischemic cardiomyopathy (HCC)    Paroxysmal atrial fibrillation (HCC)    Pneumonia    "that's why I'm here" (10/17/2012)   S/P AVR (aortic valve replacement) 04/26/2019   Stroke ([place])    12/2021    Social History:  Social History   Socioeconomic History   Marital status: Married    Spouse name:  Not on file   Number of children: 2   Years of education: college   Highest education level: Not on file  Occupational History    Employer: RETIRED  Tobacco Use   Smoking status: Former    Packs/day: 3.00    Years: 30.00    Total pack years: 90.00    Types: Cigarettes    Start date: 05/19/1958    Quit date: 06/30/1990    Years since quitting: 31.6   Smokeless tobacco: Never   Tobacco comments:    Stopped smoking for 2-3 years during second child.    Vaping Use   Vaping Use: Never used  Substance and Sexual Activity   Alcohol use: Yes    Comment: 1-2 glasses wine/month   Drug use: No   Sexual activity: Yes    Birth control/protection: Post-menopausal    Comment: Menarche age 58, G29,P2, Menopause in 50"s, No HRT Married  Other Topics Concern   Not on file  Social History Narrative   [facility] (12/29/16):   Originally from [place], [place]. Moved to [place] at 81 y.o. Does have a dog. Hasn't worked outside the home. Remote parakeet exposure from her children. No mold or hot tub exposure. Enjoys doing yard work & walking.    Social Determinants of Health   Financial Resource Strain: Not on file  Food Insecurity: Not on file  Transportation Needs: Not on file  Physical Activity: Not on file  Stress: Not on file  Social Connections: Not on file  Intimate Partner Violence: Not on file    Medications:   Current Outpatient Medications on File Prior to Visit  Medication Sig Dispense Refill   albuterol (PROAIR HFA) 108 (90 Base) MCG/ACT inhaler 2 puffs every 4 hours as needed only  if your can't catch your breath     apixaban (ELIQUIS) 5 MG TABS tablet TAKE 1 TABLET(5 MG) BY MOUTH TWICE DAILY 180 tablet 1   atorvastatin (LIPITOR) 20 MG tablet Take 20 mg by mouth daily.      Budeson-Glycopyrrol-Formoterol (BREZTRI AEROSPHERE) 160-9-4.8 MCG/ACT AERO Inhale 2 puffs into the lungs 2 (two) times daily. 10.7 g 11   carvedilol (COREG) 6.25 MG tablet Take 1.5 tablets (9.375 mg total) by mouth 2 (two) times daily with a meal. 45 tablet 3   denosumab (PROLIA) 60 MG/ML SOSY injection Inject 60 mg into the skin every 6 (six) months.     diltiazem (CARDIZEM) 30 MG tablet Take 1 tablet every 4 hours AS NEEDED for AFIB heart rate >100 45 tablet 1   fluticasone (FLONASE) 50 MCG/ACT nasal spray Place 2 sprays into both nostrils daily. 16 g 3   guaiFENesin (MUCINEX) 600 MG 12 hr tablet Take 1 tablet (600 mg total) by mouth 2 (two) times daily. 30  tablet 0   sertraline (ZOLOFT) 100 MG tablet Take 100 mg by mouth daily.     Current Facility-Administered Medications on File Prior to Visit  Medication Dose Route Frequency Provider Last Rate Last Admin   sodium chloride flush (NS) 0.9 % injection 3 mL  3 mL Intravenous Q12H Larey Dresser, MD        Allergies:  No Known Allergies  Physical Exam General: well developed, well nourished pleasant elderly Caucasian lady, seated, in no evident distress Head: head normocephalic and atraumatic.   Neck: supple with no carotid or supraclavicular bruits Cardiovascular: regular rate and rhythm, no murmurs Musculoskeletal: no deformity Skin:  no rash/petichiae Vascular:  Normal pulses all extremities  Neurologic  Exam Mental Status: Awake and fully alert. Oriented to place and time. Recent and remote memory intact. Attention span, concentration and fund of knowledge appropriate. Mood and affect appropriate.  Recall 3/3.  Able to name 16 animals that can walk on 4 legs. Cranial Nerves: Fundoscopic exam reveals sharp disc margins. Pupils equal, briskly reactive to light. Extraocular movements full without nystagmus. Visual fields full to confrontation. Hearing intact. Facial sensation intact. Face, tongue, palate moves normally and symmetrically.  Motor: Normal bulk and tone. Normal strength in all tested extremity muscles. Sensory.: intact to touch , pinprick , position and vibratory sensation.  Coordination: Rapid alternating movements normal in all extremities. Finger-to-nose and heel-to-shin performed accurately bilaterally. Gait and Station: Arises from chair without difficulty. Stance is normal. Gait demonstrates normal stride length and balance . Able to heel, toe and tandem walk with difficulty.  Reflexes: 1+ and symmetric. Toes downgoing.   NIHSS  0 Modified Rankin  1   ASSESSMENT: 81 year old patient with episode of transient dizziness and disorientation July 2023 due to small left  parietal occipital embolic infarct of cryptogenic etiology.  Vascular risk factors hypertension, hyperlipidemia and past history of transient postop atrial fibrillation following aortic valve replacement     PLAN: I had a long d/w patient about his recent embolic stroke, history of constant atrial fibrillation cardiac surgery, risk for recurrent stroke/TIAs, personally independently reviewed imaging studies and stroke evaluation results and answered questions.Continue Eliquis (apixaban) daily  for secondary stroke prevention and maintain strict control of hypertension with blood pressure goal below 130/90, diabetes with hemoglobin A1c goal below 6.5% and lipids with LDL cholesterol goal below 70 mg/dL. I also advised the patient to eat a healthy diet with plenty of whole grains, cereals, fruits and vegetables, exercise regularly and maintain ideal body weight Followup in the future with me in 6 months or call earlier if necessary.  Greater than 50% time during this 45-minute consultation visit was spent on counseling and coordination of care about her embolic stroke and history of transient postop atrial fibrillation and discussion about risk benefit of anticoagulation stroke prevention and treatment and answering questions. Antony Contras, MD  Note: This document was prepared with digital dictation and possible smart phrase technology. Any transcriptional errors that result from this process are unintentional.

## 2022-03-09 DIAGNOSIS — I672 Cerebral atherosclerosis: Secondary | ICD-10-CM | POA: Diagnosis not present

## 2022-03-16 DIAGNOSIS — R490 Dysphonia: Secondary | ICD-10-CM | POA: Diagnosis not present

## 2022-03-16 DIAGNOSIS — F411 Generalized anxiety disorder: Secondary | ICD-10-CM | POA: Diagnosis not present

## 2022-03-16 DIAGNOSIS — R911 Solitary pulmonary nodule: Secondary | ICD-10-CM | POA: Diagnosis not present

## 2022-03-16 DIAGNOSIS — H8110 Benign paroxysmal vertigo, unspecified ear: Secondary | ICD-10-CM | POA: Diagnosis not present

## 2022-03-21 DIAGNOSIS — J449 Chronic obstructive pulmonary disease, unspecified: Secondary | ICD-10-CM | POA: Diagnosis not present

## 2022-03-21 DIAGNOSIS — I10 Essential (primary) hypertension: Secondary | ICD-10-CM | POA: Diagnosis not present

## 2022-03-21 DIAGNOSIS — E7800 Pure hypercholesterolemia, unspecified: Secondary | ICD-10-CM | POA: Diagnosis not present

## 2022-03-25 ENCOUNTER — Ambulatory Visit: Payer: Medicare Other | Attending: Nurse Practitioner | Admitting: Nurse Practitioner

## 2022-03-25 ENCOUNTER — Encounter: Payer: Self-pay | Admitting: Nurse Practitioner

## 2022-03-25 VITALS — BP 110/72 | HR 68 | Ht 64.0 in | Wt 124.8 lb

## 2022-03-25 DIAGNOSIS — R911 Solitary pulmonary nodule: Secondary | ICD-10-CM

## 2022-03-25 DIAGNOSIS — I351 Nonrheumatic aortic (valve) insufficiency: Secondary | ICD-10-CM

## 2022-03-25 DIAGNOSIS — I480 Paroxysmal atrial fibrillation: Secondary | ICD-10-CM

## 2022-03-25 DIAGNOSIS — I428 Other cardiomyopathies: Secondary | ICD-10-CM

## 2022-03-25 DIAGNOSIS — I10 Essential (primary) hypertension: Secondary | ICD-10-CM

## 2022-03-25 DIAGNOSIS — I340 Nonrheumatic mitral (valve) insufficiency: Secondary | ICD-10-CM

## 2022-03-25 DIAGNOSIS — Z952 Presence of prosthetic heart valve: Secondary | ICD-10-CM

## 2022-03-25 DIAGNOSIS — I2510 Atherosclerotic heart disease of native coronary artery without angina pectoris: Secondary | ICD-10-CM | POA: Diagnosis not present

## 2022-03-25 DIAGNOSIS — I5022 Chronic systolic (congestive) heart failure: Secondary | ICD-10-CM

## 2022-03-25 DIAGNOSIS — R42 Dizziness and giddiness: Secondary | ICD-10-CM

## 2022-03-25 DIAGNOSIS — R55 Syncope and collapse: Secondary | ICD-10-CM | POA: Diagnosis not present

## 2022-03-25 DIAGNOSIS — Z8673 Personal history of transient ischemic attack (TIA), and cerebral infarction without residual deficits: Secondary | ICD-10-CM

## 2022-03-25 DIAGNOSIS — I77810 Thoracic aortic ectasia: Secondary | ICD-10-CM

## 2022-03-25 DIAGNOSIS — E785 Hyperlipidemia, unspecified: Secondary | ICD-10-CM

## 2022-03-25 NOTE — Progress Notes (Signed)
Office Visit    Patient Name: Dana Pruitt Date of Encounter: 03/25/2022  Primary Care Provider:  Deland Pretty, MD Primary Cardiologist:  Quay Burow, MD  Chief Complaint    81 year old female with a history of mild nonobstructive CAD, severe aortic insufficiency s/p AVR in 04/2019, mitral valve regurgitation, chronic systolic heart failure with subsequent normalization of EF, NICM, paroxysmal atrial fibrillation on Eliquis, CVA in 12/2021, hypertension, hyperlipidemia, COPD, GERD, anxiety, depression, and breast cancer s/p chemotherapy/radiation and lumpectomy in 2013 who presents for follow-up related to hypertension.  Past Medical History    Past Medical History:  Diagnosis Date   Anemia    "several times over the years" (10/17/2012)   Anxiety    Breast cancer ([facility]) 02/2010   right breast   CAD (coronary artery disease)    a. LHC 12/2018: mild non-obstructive CAD   Carcinoma of breast treated with adjuvant hormone therapy ([place]) 02/2010   Femara   Chronic systolic CHF    a. Echo in 2015: LVEF of 35-40%, b. Echo in 12/2021: LVEF of 60-65%   COPD (chronic obstructive pulmonary disease) (HCC)    Depression    GERD (gastroesophageal reflux disease)    diet controlled, no med   Hollenhorst plaque, left eye    apparently resolved at recent ophthalmology visit   Hyperlipidemia    Hypertension    IDC, RightStage II, 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   Moderate aortic insufficiency    Non-ischemic cardiomyopathy (HCC)    Paroxysmal atrial fibrillation (HCC)    Pneumonia    "that's why I'm here" (10/17/2012)   S/P AVR (aortic valve replacement) 04/26/2019   Stroke ([facility])    12/2021   Past Surgical History:  Procedure Laterality Date   AORTIC VALVE REPLACEMENT N/A 01/31/2019   Procedure: AORTIC VALVE REPLACEMENT (AVR) USING 21 MM INSPIRIS RESILIS AORTIC VALVE. SN: [id number];  Surgeon: Prescott Gum, Collier Salina, MD;  Location: [facility];  Service: Open Heart Surgery;  Laterality: N/A;    APPENDECTOMY  1974   BREAST BIOPSY Right 02/27/10   Needle core Biopsy; Invasive Mammary; ER/PR Positive, Her-2 Neu negative, Ki-67 22%   BREAST LUMPECTOMY WITH SENTINEL LYMPH NODE BIOPSY Right 07/03/11   Invasive Ductal Carcinoma;0/3 nodes negative,; ER,PR Positive, Her-2 Neg; Ki-67 22%   CESAREAN SECTION  1970; 1974   COLONOSCOPY     DILATION AND CURETTAGE OF UTERUS  1970's   RIGHT/LEFT HEART CATH AND CORONARY ANGIOGRAPHY N/A 01/02/2019   Procedure: RIGHT/LEFT HEART CATH AND CORONARY ANGIOGRAPHY;  Surgeon: Larey Dresser, MD;  Location: [facility];  Service: Cardiovascular;  Laterality: N/A;   TEE WITHOUT CARDIOVERSION N/A 01/31/2019   Procedure: TRANSESOPHAGEAL ECHOCARDIOGRAM (TEE);  Surgeon: Prescott Gum, Collier Salina, MD;  Location: [facility];  Service: Open Heart Surgery;  Laterality: N/A;   TUBAL LIGATION  1974   WISDOM TOOTH EXTRACTION      Allergies  No Known Allergies  History of Present Illness    81 year old female with the above past medical history including mild nonobstructive CAD, severe aortic insufficiency s/p AVR in 04/2019, mitral valve regurgitation, chronic systolic heart failure with subsequent normalization of EF, NICM, paroxysmal atrial fibrillation on Eliquis, CVA in 12/2021, hypertension, hyperlipidemia, COPD, GERD, anxiety, depression, and breast cancer s/p chemotherapy/radiation and lumpectomy in 2013.   She was referred to Dr. [name] in 2015 after echocardiogram revealed EF 35 to 40% with moderate aortic insufficiency.  She was started on a beta-blocker for cardiomyopathy and EF eventually normalized to 60 to 65% on  repeat echo in 2018. Her aortic disease progressed and echo in 09/2018 showed EF 40 to 45%, diffuse hypokinesis, G1 DD, severe AI.  She was hospitalized in July 2020 with chest pain and shortness of breath.  She underwent R/LHC which revealed nonobstructive CAD, wide aortic pulse pressure suggestive of significant AI, preserved cardiac output, and low filling  pressures.  She ultimately underwent AVR with 21 mm Inspiris Edwards valve in 01/2019.  She developed postop atrial fibrillation but later converted to sinus rhythm following initiation of amiodarone.  She was discharged on amiodarone and Coumadin both of which were later discontinued. Echocardiogram in 09/2019 showed EF 55 to 60%, well-functioning AVR.  She was evaluated in the A-fib clinic in 02/2020 and was started on Eliquis due to recurrent episodes of atrial fibrillation. Carvedilol was decreased due to dizziness.  She complained of left shoulder pain with an associated nausea and near syncope as well as several episodes of dizziness.  She underwent Myoview which was low risk, no evidence of ischemia.  Event monitor was recommended but never pursued to improvement in dizziness, no recurrent presyncope.  Routine echocardiogram in April 2022 showed EF 65%, G1 DD, well-functioning aortic bioprosthesis, mild MR, and mild dilation of ascending aorta, 42 mm.  She was evaluated in the office in June 2023 and reported exertional dyspnea, increased dizziness which seem to correlate with self-increased dosing of sertraline. Symptoms resolved after resuming previous dose of Sertraline.  Coronary CTA was recommended, however, before this could be performed she was hospitalized in the setting of acute ischemic stroke.  MRI showed acute nonhemorrhagic infarct of the left occipital lobe.  Showed EF 65%, normal wall motion, G1 DD, stable AVR, mild dilation of ascending aorta measuring 43 mm.  Neurology recommended continuing Eliquis. Coronary CTA showed calcium score of 709 (87 percentile for age and sex matched control), mild plaque in the RCA, LAD, and LCx (FFR negative), as well as 4.2 cm ascending aortic aneurysm (stable from prior imaging), signs  (follow-up with pulmonology was recommended).     Her PCP transitioned her from carvedilol to Toprol in the setting of dizziness. She was last seen in the office on 02/10/2022  and several episodes of dizziness, presyncope.  Orthostatics were negative.  14-day live ZIO monitor was ordered but never completed due to high out of pocket expense.  Metoprolol was discontinued and she was restarted on carvedilol due to elevated BP.  She presents today for follow-up. Since her last visit he has done well from a cardiac standpoint.  BP has been well controlled.  She denies any recurrent dizziness, presyncope, syncope.  She states she has been referred to rehab for treatment of vertigo.  She does note overall decreased energy, but otherwise, she reports feeling well denies any additional concerns today.  Home Medications    Current Outpatient Medications  Medication Sig Dispense Refill   albuterol (PROAIR HFA) 108 (90 Base) MCG/ACT inhaler 2 puffs every 4 hours as needed only  if your can't catch your breath     apixaban (ELIQUIS) 5 MG TABS tablet TAKE 1 TABLET(5 MG) BY MOUTH TWICE DAILY 180 tablet 1   atorvastatin (LIPITOR) 20 MG tablet Take 20 mg by mouth daily.      Budeson-Glycopyrrol-Formoterol (BREZTRI AEROSPHERE) 160-9-4.8 MCG/ACT AERO Inhale 2 puffs into the lungs 2 (two) times daily. 10.7 g 11   carvedilol (COREG) 6.25 MG tablet Take 1.5 tablets (9.375 mg total) by mouth 2 (two) times daily with a meal. 45 tablet 3  denosumab (PROLIA) 60 MG/ML SOSY injection Inject 60 mg into the skin every 6 (six) months.     diltiazem (CARDIZEM) 30 MG tablet Take 1 tablet every 4 hours AS NEEDED for AFIB heart rate >100 45 tablet 1   fluticasone (FLONASE) 50 MCG/ACT nasal spray Place 2 sprays into both nostrils daily. 16 g 3   guaiFENesin (MUCINEX) 600 MG 12 hr tablet Take 1 tablet (600 mg total) by mouth 2 (two) times daily. 30 tablet 0   sertraline (ZOLOFT) 100 MG tablet Take 100 mg by mouth daily.     No current facility-administered medications for this visit.   Facility-Administered Medications Ordered in Other Visits  Medication Dose Route Frequency Provider Last Rate Last  Admin   sodium chloride flush (NS) 0.9 % injection 3 mL  3 mL Intravenous Q12H Larey Dresser, MD         Review of Systems    She denies chest pain, palpitations, dyspnea, pnd, orthopnea, n, v, dizziness, syncope, edema, weight gain, or early satiety. All other systems reviewed and are otherwise negative except as noted above.   Physical Exam    VS:  BP 110/72 (BP Location: Left Arm, Patient Position: Sitting, Cuff Size: Normal)   Pulse 68   Ht $R'5\' 4"'GK$  (1.626 m)   Wt 124 lb 12.8 oz (56.6 kg)   LMP  (LMP Unknown)   SpO2 96%   BMI 21.42 kg/m  GEN: Well nourished, well developed, in no acute distress. HEENT: normal. Neck: Supple, no JVD, carotid bruits, or masses. Cardiac: RRR, no murmurs, rubs, or gallops. No clubbing, cyanosis, edema.  Radials/DP/PT 2+ and equal bilaterally.  Respiratory:  Respirations regular and unlabored, clear to auscultation bilaterally. GI: Soft, nontender, nondistended, BS + x 4. MS: no deformity or atrophy. Skin: warm and dry, no rash. Neuro:  Strength and sensation are intact. Psych: Normal affect.  Accessory Clinical Findings    ECG personally reviewed by me today - No EKG in office today.     Lab Results  Component Value Date   WBC 4.5 12/21/2021   HGB 11.8 (L) 12/21/2021   HCT 35.0 (L) 12/21/2021   MCV 91.6 12/21/2021   PLT 197 12/21/2021   Lab Results  Component Value Date   CREATININE 0.96 12/21/2021   BUN 11 12/21/2021   NA 138 12/21/2021   K 3.5 12/21/2021   CL 109 12/21/2021   CO2 22 12/21/2021   Lab Results  Component Value Date   ALT 12 12/21/2021   AST 13 (L) 12/21/2021   ALKPHOS 30 (L) 12/21/2021   BILITOT 0.7 12/21/2021   Lab Results  Component Value Date   CHOL 125 12/21/2021   HDL 41 12/21/2021   LDLCALC 69 12/21/2021   TRIG 73 12/21/2021   CHOLHDL 3.0 12/21/2021    Lab Results  Component Value Date   HGBA1C 5.9 (H) 12/20/2021    Assessment & Plan    1. Dizziness/presyncope: She has a longstanding history  of intermittent dizziness/lightheadedness (with a history of vertigo).  Noted recent increase in episodes of presyncope, dizziness.  14-day ZIO was ordered but not completed due to high out-of-pocket expense. She denies any recent dizziness, presyncope, syncope.  She is about to start treatment for vertigo.  Continue to monitor symptoms.    2. Hypertension: BP well controlled. Continue current antihypertensive regimen.    3. Nonobstructive CAD: Coronary CTA in July 2023 showed coronary calcium score of 709 (87th percentile for age and sex matched control),  plaque in the RCA, LAD, and left circumflex arteries (FFR negative). Stable with no anginal symptoms. No indication for ischemic evaluation. No aspirin due to chronic DOAC therapy. Continue metoprolol, Lipitor.   4. Dyspnea on exertion/NICM/chronic systolic heart failure: EF prior EF 35 to 40% in 2015.  Most recent echo in July 2023 showed EF 65%, normal wall motion, G1 DD.  Recent coronary CTA as above. Denies any worsening dyspnea.  With pulmonology.  Euvolemic and well compensated on exam.  Continue carvedilol as above.   5. Paroxysmal atrial fibrillation: Appears to be maintaining NSR on auscultation.  Continue carvedilol, Eliquis, prn diltiazem.   6. Severe aortic insufficiency s/p AVR: Most recent echo as above with stable AVR, no AS or AI noted.  Continue SBE prophylaxis prior to dental procedures.   7. Mild mitral valve regurgitation: Trivial on most recent echo.   8. Ascending aorta dilation: Recent coronary CTA in July 2023 showed stable mild ascending aortic aneurysm measuring 4.2 cm, unchanged from prior echo in 09/2020. Continue to monitor with yearly echocardiogram.   9. Hyperlipidemia: LDL was 69 in 12/2021.  Continue Lipitor, Zetia.  Monitored and managed per PCP.   10. Pulmonary nodule: Follow-up with pulmonology.  84. H/o CVA: Following with neurology.  Continue Eliquis, Lipitor.   12. Disposition: Follow-up in 5-6 months with  Dr. [name].      Lenna Sciara, NP 03/25/2022, 12:47 PM

## 2022-03-25 NOTE — Patient Instructions (Signed)
Medication Instructions:  Your physician recommends that you continue on your current medications as directed. Please refer to the Current Medication list given to you today.   *If you need a refill on your cardiac medications before your next appointment, please call your pharmacy*   Lab Work: NONE ordered at this time of appointment    If you have labs (blood work) drawn today and your tests are completely normal, you will receive your results only by: MyChart Message (if you have MyChart) OR A paper copy in the mail If you have any lab test that is abnormal or we need to change your treatment, we will call you to review the results.   Testing/Procedures: NONE ordered at this time of appointment     Follow-Up: At [facility], you and your health needs are our priority.  As part of our continuing mission to provide you with exceptional heart care, we have created designated Provider Care Teams.  These Care Teams include your primary Cardiologist (physician) and Advanced Practice Providers (APPs -  Physician Assistants and Nurse Practitioners) who all work together to provide you with the care you need, when you need it.  We recommend signing up for the patient portal called "MyChart".  Sign up information is provided on this After Visit Summary.  MyChart is used to connect with patients for Virtual Visits (Telemedicine).  Patients are able to view lab/test results, encounter notes, upcoming appointments, etc.  Non-urgent messages can be sent to your provider as well.   To learn more about what you can do with MyChart, go to [email or web address].    Your next appointment:   5-6 month(s)  The format for your next appointment:   In Person  Provider:   Jonathan Berry, MD     Other Instructions   Important Information About Sugar       

## 2022-03-26 ENCOUNTER — Other Ambulatory Visit (HOSPITAL_COMMUNITY): Payer: Self-pay | Admitting: Cardiovascular Disease

## 2022-03-26 DIAGNOSIS — I480 Paroxysmal atrial fibrillation: Secondary | ICD-10-CM

## 2022-03-26 NOTE — Telephone Encounter (Signed)
Eliquis '5mg'$  refill request received. Patient is 81 years old, weight-56.6kg, Crea-0.96 on 12/21/2021, Diagnosis-Afib, and last seen by Diona Browner, NP on 03/25/2022. Dose is appropriate based on dosing criteria. Will send in refill to requested pharmacy.

## 2022-04-02 DIAGNOSIS — R42 Dizziness and giddiness: Secondary | ICD-10-CM | POA: Diagnosis not present

## 2022-04-07 DIAGNOSIS — L814 Other melanin hyperpigmentation: Secondary | ICD-10-CM | POA: Diagnosis not present

## 2022-04-07 DIAGNOSIS — I788 Other diseases of capillaries: Secondary | ICD-10-CM | POA: Diagnosis not present

## 2022-04-07 DIAGNOSIS — Z85828 Personal history of other malignant neoplasm of skin: Secondary | ICD-10-CM | POA: Diagnosis not present

## 2022-04-07 DIAGNOSIS — L821 Other seborrheic keratosis: Secondary | ICD-10-CM | POA: Diagnosis not present

## 2022-04-20 DIAGNOSIS — M810 Age-related osteoporosis without current pathological fracture: Secondary | ICD-10-CM | POA: Diagnosis not present

## 2022-04-23 ENCOUNTER — Ambulatory Visit: Payer: Medicare Other | Admitting: Internal Medicine

## 2022-04-23 ENCOUNTER — Encounter: Payer: Self-pay | Admitting: Internal Medicine

## 2022-04-23 VITALS — BP 118/76 | HR 73 | Ht 64.0 in | Wt 122.4 lb

## 2022-04-23 DIAGNOSIS — J449 Chronic obstructive pulmonary disease, unspecified: Secondary | ICD-10-CM

## 2022-04-23 DIAGNOSIS — R911 Solitary pulmonary nodule: Secondary | ICD-10-CM

## 2022-04-23 MED ORDER — FAMOTIDINE 20 MG PO TABS: ORAL_TABLET | ORAL | 11 refills | Status: DC

## 2022-04-23 MED ORDER — PANTOPRAZOLE SODIUM 40 MG PO TBEC: 40.0000 mg | DELAYED_RELEASE_TABLET | Freq: Every day | ORAL | 2 refills | Status: DC

## 2022-04-23 NOTE — Patient Instructions (Addendum)
Pantoprazole (protonix) 40 mg   Take  30-60 min before first meal of the day and Pepcid (famotidine)  20 mg after supper until return to office - this is the best way to tell whether stomach acid is contributing to your problem.    GERD (REFLUX)  is an extremely common cause of respiratory symptoms just like yours , many times with no obvious heartburn at all.    It can be treated with medication, but also with lifestyle changes including elevation of the head of your bed (ideally with 6 -8inch blocks under the headboard of your bed),  Smoking cessation, avoidance of late meals, excessive alcohol, and avoid fatty foods, chocolate, peppermint, colas, red wine, and acidic juices such as orange juice.  NO MINT OR MENTHOL PRODUCTS SO NO COUGH DROPS  USE SUGARLESS CANDY INSTEAD (Jolley ranchers or Stover's or Life Savers) or even ice chips will also do - the key is to swallow to prevent all throat clearing. NO OIL BASED VITAMINS - use powdered substitutes.  Avoid fish oil when coughing.   Work on inhaler technique:  relax and gently blow all the way out then take a nice smooth full deep breath back in, triggering the inhaler at same time you start breathing in.  Hold breath in for at least  5 seconds if you can. Blow out [employer] thru nose. Rinse and gargle with water when done.  If mouth or throat bother you at all,  try brushing teeth/gums/tongue with arm and hammer toothpaste/ make a slurry and gargle and spit out.   Reduce breztri to 2 puffs each am to see if makes any difference  Keep your ENT appt   If you are satisfied with your treatment plan,  let your doctor know and he/she can either refill your medications or you can return here when your prescription runs out.     If in any way you are not 100% satisfied,  please tell us.  If 100% better, tell your friends!  Pulmonary follow up is as needed

## 2022-04-23 NOTE — Assessment & Plan Note (Signed)
Quit smoking 1992  01/04/17: FVC 2.17 L (79%) FEV1 1.32 L (64%) FEV1/FVC 0.61 FEF 25-75 0.57 L (34%) negative bronchodilator response TLC 5.23 L (105%) RV 136% ERV 39% DLCO corrected 43% - alpha one AT  03/08/17   MM  Level 164  - PFT's  01/05/2019  FEV1 1.30 (64 % ) ratio 0.62  p 6 % improvement from saba p ? prior to study with DLCO  7.27 (39%) corrects to 2.15 (52%)  for alv volume and FV curve classic concave pattern - 05/31/2019  After extensive coaching inhaler device,  effectiveness =    75% (delayed insp) with SMI > try stiolto 2 puffs each am > improved - 09/04/2020 changed to trelegy200 during  Flare > rec trelegy 100 maint  - 01/26/22 changed to breztri > no better hoarseness > ent eval    Group D (now reclassified as E) in terms of symptom/risk and laba/lama/ICS  therefore appropriate rx at this point >>>  breztri approp, ok to reduce to 2 puffs each am as long as no change in ex tol, no more freq saba need or tendency to aecopd   Rec arm and hammer toothpaste/slurry p use and f/u with ENT as planned

## 2022-04-23 NOTE — Progress Notes (Signed)
Dana Pruitt, female    DOB: September 25, 1940     MRN: [id number]   Brief patient profile:   52  yowf MM/quit smoking 1992 previously followed by Dr Ashok Cordia with  GOLD II copd    PFT 01/04/17: FVC 2.17 L (79%) FEV1 1.32 L (64%) FEV1/FVC 0.61 FEF 25-75 0.57 L (34%) negative bronchodilator response TLC 5.23 L (105%) RV 136% ERV 39% DLCO corrected 43% 12/30/12: FVC 2.77 L (101%) FEV1 1.92 L (89%) FEV1/FVC 0.69 FEF 25-75 1.13 L (51%) negative bronchodilator response TLC 4.77 L (97%) RV 110% DLCO uncorrected 51%   6MWT 03/08/17:  Walked 414 meters / Baseline Sat 96% on RA / Nadir Sat 94% on RA (stopped with 4:37 due to poor pulse ox signal)     CARDIAC TTE (07/14/16):  LV normal in size with EF 60-65% & no regional wall motion abnormalities. Grade 1 diastolic dysfunction. LA & RA normal in size. RV mildly dilated with reduced systolic function. Moderate aortic regurgitation without stenosis. Ascending aorta is mildly dilated with aortic root normal in size. No mitral stenosis or regurgitation. Mild pulmonic regurgitation. No significant tricuspid regurgitation. No pericardial effusion.    Admit date: 01/31/2019 Discharge date: 02/10/2019   Admission Diagnoses: Severe aortic insufficiency   Discharge Diagnoses:  Active Problems:   Aortic valve disease     hx The patient is a 81 year old female referred to Dr. [name] right for evaluation of severe aortic insufficiency.  She has been seen by her cardiologist for several years with serial echocardiogram.  Her most recent echo revealed severe AI with some LV dilatation and reduced LV systolic function.  She underwent right and left heart catheterization and was found to have no significant coronary artery disease.  Right heart catheterization showed normal pressures with mixed venous saturations of 68%.  LVEDP was normal and cardiac index was 2.9.  Pulmonary function studies were performed due to a history of smoking heavy smoking in the past,  although really remote.  Her FEV1 was 1.2 and DLCO was 40%.  A CT scan showed the ascending aorta measuring 4.0 cm.  There was findings of panlobular emphysema noted on the CT scan.  Dr. [name] evaluated the patient and all her studies and recommended proceeding with elective aortic valve replacement and she was admitted this hospitalization for the procedure.     Hospital Course: The patient was admitted electively and on 01/31/2019 taken to the operating room where she underwent Aortic valve replacement with a 21 mm Inspiris Edwards valve..  She tolerated it well and was taken to the surgical intensive care unit in stable condition.   Postoperative hospital course:   The patient has done well postoperatively.  She has remained neurologically intact.  She was weaned from the ventilator using standard protocols without difficulty.  She initially required some inotropic support with dobutamine but this was able to be weaned without difficulty.  She was also started on Midodrine to assist with blood pressure.  She is not felt to be a candidate at this time for ACE inhibitor or ARB.  She does have some postoperative volume overload and is responding to diuretics.  She has had postoperative atrial fibrillation and has been started on an amiodarone drip per usual protocol.  She was transitioned to p.o. amiodarone and subsequently was chemically cardioverted to sinus rhythm with only occasional runs of afib. Metoprolol was uptitrated and additionally she was started on Coumadin.  At the time of discharge the patient was very stable.  Home health arrangements were also made to have INR checked.  Additionally she did require home oxygen to be arranged with lower sats with ambulation in the qualifying range.  She has known severe COPD.        History of Present Illness  04/19/2019  Pulmonary/ 1st office eval/Jules Baty  Chief Complaint  Patient presents with   New Patient (Initial Visit)    Seen in the past by  Dr Ashok Cordia. She states she has been doing cardiac rehab at [facility] and having issues with DOE and low o2 sats.   Dyspnea:  Was walking in hallways prior to d/c with sats 87% so rec 2lpm sleeping and walking but did not  Use it that way and in rehab same problem so started on 2lpm but has been  weaned to 1pm  At home does dog walking up and down hills,  Finds talking and walking are tough/ has not noticed hoarseness  Cough: no Sleep: flat  SABA use: none  - has not helped. rec Try off lisinopril for now  Start Micardis 80 mg one daily x 4 weeks  Make sure you check your oxygen saturations at highest level of activity to be sure it stays over 90% and adjust upward to maintain this level if needed but remember to turn it back to previous settings when you stop (to conserve your supply).        01/26/2022  f/u ov/Saphire Barnhart re: GOLD 2 copd/ spn     maint on trelegy 100   Chief Complaint  Patient presents with   Follow-up    Pt states needed f/u here per cardiology regarding 01/01/22 Coronary CT. Breathing is doing well and she denies any respiratory co's.   Dyspnea:  treadmill  3-4 per week up  to 30 min flat 2.5 mph Cough: no production  Sleeping: flat with one pillow s noct cc  SABA use: no hfa or neb  02: none  Rec Plan A = Automatic = Always=    stop trelegy and start breztri Take up to  2 puffs first thing in am and then another 2 puffs about 12 hours later( If needed) Work on inhaler technique:  Plan B = Backup (to supplement plan A, not to replace it) Only use your albuterol inhaler as a rescue medication  We will put you in a computer for recall on Jul 04 2022 for repeat CT chest     04/23/2022  f/u ov/Zarin Knupp re: COPD 2  maint on breztri 2bid  / hoarseness no better Chief Complaint  Patient presents with   Follow-up   Dyspnea:  no change treadmill in terms of ex tol  Cough: no cough just hoarseness despite arm and hammer Sleeping: flat bed one pillow s resp cc  SABA use: not needing  02:  none  Covid status:   vax all     No obvious day to day or daytime variability or assoc excess/ purulent sputum or mucus plugs or hemoptysis or cp or chest tightness, subjective wheeze or overt sinus or hb symptoms.   Sleeping  without nocturnal  or early am exacerbation  of respiratory  c/o's or need for noct saba. Also denies any obvious fluctuation of symptoms with weather or environmental changes or other aggravating or alleviating factors except as outlined above   No unusual exposure hx or h/o childhood pna/ asthma or knowledge of premature birth.  Current Allergies, Complete Past Medical History, Past Surgical History, Family History, and Social  History were reviewed in [facility] record.  ROS  The following are not active complaints unless bolded Hoarseness, sore throat, dysphagia, dental problems, itching, sneezing,  nasal congestion or discharge of excess mucus or purulent secretions, ear ache,   fever, chills, sweats, unintended wt loss or wt gain, classically pleuritic or exertional cp,  orthopnea pnd or arm/hand swelling  or leg swelling, presyncope, palpitations, abdominal pain, anorexia, nausea, vomiting, diarrhea  or change in bowel habits or change in bladder habits, change in stools or change in urine, dysuria, hematuria,  rash, arthralgias, visual complaints, headache, numbness, weakness or ataxia or problems with walking or coordination,  change in mood or  memory.        Current Meds  Medication Sig   albuterol (PROAIR HFA) 108 (90 Base) MCG/ACT inhaler 2 puffs every 4 hours as needed only  if your can't catch your breath   atorvastatin (LIPITOR) 20 MG tablet Take 20 mg by mouth daily.    Budeson-Glycopyrrol-Formoterol (BREZTRI AEROSPHERE) 160-9-4.8 MCG/ACT AERO Inhale 2 puffs into the lungs 2 (two) times daily.   carvedilol (COREG) 6.25 MG tablet Take 1.5 tablets (9.375 mg total) by mouth 2 (two) times daily with a meal.   denosumab (PROLIA) 60  MG/ML SOSY injection Inject 60 mg into the skin every 6 (six) months.   diltiazem (CARDIZEM) 30 MG tablet Take 1 tablet every 4 hours AS NEEDED for AFIB heart rate >100   ELIQUIS 5 MG TABS tablet TAKE 1 TABLET(5 MG) BY MOUTH TWICE DAILY   fluticasone (FLONASE) 50 MCG/ACT nasal spray Place 2 sprays into both nostrils daily.   guaiFENesin (MUCINEX) 600 MG 12 hr tablet Take 1 tablet (600 mg total) by mouth 2 (two) times daily.   sertraline (ZOLOFT) 100 MG tablet Take 100 mg by mouth daily.            Past Medical History:  Diagnosis Date   Anemia    "several times over the years" (10/17/2012)   Anxiety    Breast cancer ([place]) 02/2010   right breast   Carcinoma of breast treated with adjuvant hormone therapy ([place]) 9/11   Femara   CHF (congestive heart failure) (HCC)    COPD (chronic obstructive pulmonary disease) (HCC)    Depression    Exertional shortness of breath    "recently" (10/17/2012)   GERD (gastroesophageal reflux disease)    diet controlled, no med   Hollenhorst plaque, left eye    apparently resolved at recent ophthalmology visit   Hyperlipidemia    Hypertension    IDC, RightStage II, 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       Objective:    Wts  04/23/2022        122  01/26/2022          122  09/15/2021        126  09/04/2020        122  08/29/2019          126  05/31/19 125 lb (56.7 kg)  05/24/19 125 lb 10.6 oz (57 kg)  04/26/19 122 lb 9.6 oz (55.6 kg)    Vital signs reviewed  04/23/2022  - Note at rest 02 sats  93% on RA   General appearance:    hoarse pleasant amb wf nad    HEENT : Oropharynx  clear   Nasal turbinates nl  NECK :  without  apparent JVD/ palpable Nodes/TM    LUNGS: no acc muscle use,  Mild barrel  contour chest wall with bilateral  Distant bs s audible wheeze and  without cough on insp or exp maneuvers  and mild  Hyperresonant  to   percussion bilaterally     CV:  RRR  no s3 or murmur or increase in P2, and no edema   ABD:  soft and nontender with pos end  insp Hoover's  in the supine position.  No bruits or organomegaly appreciated   MS:  Nl gait/ ext warm without deformities Or obvious joint restrictions  calf tenderness, cyanosis or clubbing     SKIN: warm and dry without lesions    NEURO:  alert, approp, nl sensorium with  no motor or cerebellar deficits apparent.          I personally reviewed images and agree with radiology impression as follows:   Chest CT cuts on coronary study 01/01/22  6 mm spiculated new pulmonary nodule in the anterior RIGHT upper lobe, new since 2020. Noncontrast CT at 6 months is recommended. If the nodule is stable at time of repeat CT, then future CT at 18-24 months (from today's scan) is considered optional for low-risk patients, but is recommended for high-risk patients      Assessment

## 2022-04-23 NOTE — Assessment & Plan Note (Signed)
Quit smoking 1992 - CT chest cuts on coronary study 01/01/22 new (from 2020)  54m mildly spiculated pulmonary nodule in the anterior right upper lobe> rec 6 m CT placed in reminder file for mid Jan 2024   Discussed in detail all the  indications, usual  risks and alternatives  relative to the benefits with patient who agrees to proceed with w/u as outlined.         Pulmonary f/u is prn   Each maintenance medication was reviewed in detail including emphasizing most importantly the difference between maintenance and prns and under what circumstances the prns are to be triggered using an action plan format where appropriate.  Total time for H and P, chart review, counseling, reviewing hfa device(s) and generating customized AVS unique to this office visit / same day charting = 25 min

## 2022-04-24 ENCOUNTER — Encounter: Payer: Self-pay | Admitting: Internal Medicine

## 2022-04-27 ENCOUNTER — Other Ambulatory Visit: Payer: Self-pay | Admitting: Internal Medicine

## 2022-05-13 DIAGNOSIS — J310 Chronic rhinitis: Secondary | ICD-10-CM | POA: Diagnosis not present

## 2022-05-13 DIAGNOSIS — H903 Sensorineural hearing loss, bilateral: Secondary | ICD-10-CM | POA: Diagnosis not present

## 2022-05-13 DIAGNOSIS — H6982 Other specified disorders of Eustachian tube, left ear: Secondary | ICD-10-CM | POA: Diagnosis not present

## 2022-05-13 DIAGNOSIS — R42 Dizziness and giddiness: Secondary | ICD-10-CM | POA: Diagnosis not present

## 2022-06-24 ENCOUNTER — Other Ambulatory Visit: Payer: Self-pay | Admitting: Internal Medicine

## 2022-06-24 DIAGNOSIS — R911 Solitary pulmonary nodule: Secondary | ICD-10-CM

## 2022-06-25 ENCOUNTER — Encounter: Payer: Self-pay | Admitting: Oncology

## 2022-06-29 DIAGNOSIS — R300 Dysuria: Secondary | ICD-10-CM | POA: Diagnosis not present

## 2022-07-02 ENCOUNTER — Ambulatory Visit (HOSPITAL_COMMUNITY): Payer: Medicare Other

## 2022-07-24 DIAGNOSIS — R922 Inconclusive mammogram: Secondary | ICD-10-CM | POA: Diagnosis not present

## 2022-07-24 DIAGNOSIS — N644 Mastodynia: Secondary | ICD-10-CM | POA: Diagnosis not present

## 2022-07-28 DIAGNOSIS — R350 Frequency of micturition: Secondary | ICD-10-CM | POA: Diagnosis not present

## 2022-07-28 DIAGNOSIS — R300 Dysuria: Secondary | ICD-10-CM | POA: Diagnosis not present

## 2022-07-30 ENCOUNTER — Encounter (HOSPITAL_COMMUNITY): Payer: Self-pay | Admitting: *Deleted

## 2022-07-30 ENCOUNTER — Ambulatory Visit
Admission: RE | Admit: 2022-07-30 | Discharge: 2022-07-30 | Disposition: A | Discharge status: Home / Self Care | Payer: Medicare Other | Source: Ambulatory Visit | Attending: Internal Medicine | Admitting: Internal Medicine

## 2022-07-30 DIAGNOSIS — R911 Solitary pulmonary nodule: Secondary | ICD-10-CM

## 2022-07-30 DIAGNOSIS — J439 Emphysema, unspecified: Secondary | ICD-10-CM | POA: Diagnosis not present

## 2022-08-06 ENCOUNTER — Other Ambulatory Visit: Payer: Self-pay | Admitting: Internal Medicine

## 2022-08-06 DIAGNOSIS — R918 Other nonspecific abnormal finding of lung field: Secondary | ICD-10-CM

## 2022-08-06 NOTE — Progress Notes (Signed)
Spoke with pt and notified of results per Dr. Wert. Pt verbalized understanding and denied any questions. 

## 2022-08-10 DIAGNOSIS — R911 Solitary pulmonary nodule: Secondary | ICD-10-CM | POA: Diagnosis not present

## 2022-08-10 DIAGNOSIS — F411 Generalized anxiety disorder: Secondary | ICD-10-CM | POA: Diagnosis not present

## 2022-08-17 DIAGNOSIS — L821 Other seborrheic keratosis: Secondary | ICD-10-CM | POA: Diagnosis not present

## 2022-08-17 DIAGNOSIS — Z85828 Personal history of other malignant neoplasm of skin: Secondary | ICD-10-CM | POA: Diagnosis not present

## 2022-08-17 DIAGNOSIS — L820 Inflamed seborrheic keratosis: Secondary | ICD-10-CM | POA: Diagnosis not present

## 2022-08-17 DIAGNOSIS — L570 Actinic keratosis: Secondary | ICD-10-CM | POA: Diagnosis not present

## 2022-08-17 DIAGNOSIS — D692 Other nonthrombocytopenic purpura: Secondary | ICD-10-CM | POA: Diagnosis not present

## 2022-08-20 DIAGNOSIS — I10 Essential (primary) hypertension: Secondary | ICD-10-CM | POA: Diagnosis not present

## 2022-08-20 DIAGNOSIS — J449 Chronic obstructive pulmonary disease, unspecified: Secondary | ICD-10-CM | POA: Diagnosis not present

## 2022-08-20 DIAGNOSIS — E7800 Pure hypercholesterolemia, unspecified: Secondary | ICD-10-CM | POA: Diagnosis not present

## 2022-08-25 ENCOUNTER — Ambulatory Visit: Payer: Medicare Other | Admitting: Neurology

## 2022-08-26 ENCOUNTER — Telehealth: Payer: Self-pay

## 2022-08-26 NOTE — Patient Instructions (Signed)
Visit Information  Thank you for taking time to visit with me today. Please don't hesitate to contact me if I can be of assistance to you.   Following are the goals we discussed today:   Goals Addressed             This Visit's Progress    COMPLETED: Care coordination Activities-No follow up required       Care Coordination Interventions: Advised patient to Annual Wellness exam. Discussed [facility] services and support. Assessed SDOH. Advised to discuss with primary care physician if services needed in the future.         If you are experiencing a Mental Health or [place] or need someone to talk to, please call the Suicide and Crisis Lifeline: 988   Patient verbalizes understanding of instructions and care plan provided today and agrees to view in [place]. Active MyChart status and patient understanding of how to access instructions and care plan via MyChart confirmed with patient.     The patient has been provided with contact information for the care management team and has been advised to call with any health related questions or concerns.  No further follow up required: decline  Jone Baseman, RN, MSN [facility] Care Management Coordinator Direct Line [phone]

## 2022-08-26 NOTE — Patient Outreach (Signed)
  Care Coordination   Initial Visit Note   08/26/2022 Name: Dana Pruitt MRN: [id number] DOB: Feb 13, 1941  Dana Pruitt is a 82 y.o. year old female who sees Deland Pretty, MD for primary care. I spoke with  [name] by phone today.  What matters to the patients health and wellness today?  none    Goals Addressed             This Visit's Progress    COMPLETED: Care coordination Activities-No follow up required       Care Coordination Interventions: Advised patient to Annual Wellness exam. Discussed [facility] services and support. Assessed SDOH. Advised to discuss with primary care physician if services needed in the future.        SDOH assessments and interventions completed:  Yes  SDOH Interventions Today    Flowsheet Row Most Recent Value  SDOH Interventions   Transportation Interventions Intervention Not Indicated        Care Coordination Interventions:  Yes, provided   Follow up plan: No further intervention required.   Encounter Outcome:  Pt. Visit Completed   Jone Baseman, RN, MSN [facility] Care Management Coordinator Direct Line [phone]

## 2022-09-01 ENCOUNTER — Ambulatory Visit: Payer: Medicare Other | Attending: Cardiovascular Disease | Admitting: Cardiovascular Disease

## 2022-09-07 ENCOUNTER — Other Ambulatory Visit (HOSPITAL_COMMUNITY): Payer: Self-pay | Admitting: Cardiovascular Disease

## 2022-09-14 ENCOUNTER — Telehealth (HOSPITAL_BASED_OUTPATIENT_CLINIC_OR_DEPARTMENT_OTHER): Payer: Self-pay | Admitting: Internal Medicine

## 2022-09-14 ENCOUNTER — Other Ambulatory Visit: Payer: Self-pay

## 2022-09-14 MED ORDER — ALBUTEROL SULFATE HFA 108 (90 BASE) MCG/ACT IN AERS: INHALATION_SPRAY | RESPIRATORY_TRACT | 6 refills | Status: DC

## 2022-09-14 NOTE — Telephone Encounter (Signed)
Pro Air has been sent to patients pharmacy. NFN

## 2022-09-14 NOTE — Telephone Encounter (Signed)
Pt. Calling to get refills on [facility])  she is out and needs called into pharmacy

## 2022-09-21 DIAGNOSIS — M795 Residual foreign body in soft tissue: Secondary | ICD-10-CM | POA: Diagnosis not present

## 2022-09-21 DIAGNOSIS — F411 Generalized anxiety disorder: Secondary | ICD-10-CM | POA: Diagnosis not present

## 2022-09-22 ENCOUNTER — Other Ambulatory Visit (HOSPITAL_COMMUNITY): Payer: Self-pay | Admitting: Cardiovascular Disease

## 2022-09-22 DIAGNOSIS — I480 Paroxysmal atrial fibrillation: Secondary | ICD-10-CM

## 2022-09-22 NOTE — Telephone Encounter (Signed)
Eliquis 5mg  refill request received. Patient is 82 years old, weight-55.5kg, Crea-0.96 on 12/21/21, Diagnosis-Afib, and last seen by Diona Browner, NP on 03/25/22.  Pt had a birthday and turned 74 since last refill, will discuss dose with PharmD.

## 2022-09-23 MED ORDER — APIXABAN 2.5 MG PO TABS: 2.5000 mg | ORAL_TABLET | Freq: Two times a day (BID) | ORAL | 1 refills | Status: DC

## 2022-09-23 NOTE — Telephone Encounter (Signed)
Yes, please reduce dose to 2.5mg  BID

## 2022-09-23 NOTE — Telephone Encounter (Signed)
Called pt and advised that her eliquis dose needs to be reduced to 2.5mg  eliquis twice a day. She verbalized understanding and is aware it will be sent to her requested pharmacy.   Also, called pharmacy in reference to the dose change and they are removing the eliquis 5mg  tablet off the list.

## 2022-10-07 NOTE — Telephone Encounter (Signed)
Proair was refilled for pt. Closing encounter.

## 2022-10-14 DIAGNOSIS — L821 Other seborrheic keratosis: Secondary | ICD-10-CM | POA: Diagnosis not present

## 2022-10-14 DIAGNOSIS — Z85828 Personal history of other malignant neoplasm of skin: Secondary | ICD-10-CM | POA: Diagnosis not present

## 2022-10-14 DIAGNOSIS — L570 Actinic keratosis: Secondary | ICD-10-CM | POA: Diagnosis not present

## 2022-10-14 DIAGNOSIS — L300 Nummular dermatitis: Secondary | ICD-10-CM | POA: Diagnosis not present

## 2022-10-21 DIAGNOSIS — M810 Age-related osteoporosis without current pathological fracture: Secondary | ICD-10-CM | POA: Diagnosis not present

## 2022-10-22 DIAGNOSIS — K080 Exfoliation of teeth due to systemic causes: Secondary | ICD-10-CM | POA: Diagnosis not present

## 2022-11-04 ENCOUNTER — Ambulatory Visit
Admission: RE | Admit: 2022-11-04 | Discharge: 2022-11-04 | Disposition: A | Discharge status: Home / Self Care | Payer: Medicare Other | Source: Ambulatory Visit | Attending: Internal Medicine | Admitting: Internal Medicine

## 2022-11-04 DIAGNOSIS — Z853 Personal history of malignant neoplasm of breast: Secondary | ICD-10-CM | POA: Diagnosis not present

## 2022-11-04 DIAGNOSIS — R918 Other nonspecific abnormal finding of lung field: Secondary | ICD-10-CM

## 2022-11-04 DIAGNOSIS — R911 Solitary pulmonary nodule: Secondary | ICD-10-CM | POA: Diagnosis not present

## 2022-11-05 DIAGNOSIS — R490 Dysphonia: Secondary | ICD-10-CM | POA: Diagnosis not present

## 2022-11-05 DIAGNOSIS — K219 Gastro-esophageal reflux disease without esophagitis: Secondary | ICD-10-CM | POA: Diagnosis not present

## 2022-11-09 NOTE — Progress Notes (Signed)
Spoke with pt and notified of results per Dr. Wert. Pt verbalized understanding and denied any questions. 

## 2022-11-30 DIAGNOSIS — K080 Exfoliation of teeth due to systemic causes: Secondary | ICD-10-CM | POA: Diagnosis not present

## 2022-12-01 DIAGNOSIS — K080 Exfoliation of teeth due to systemic causes: Secondary | ICD-10-CM | POA: Diagnosis not present

## 2022-12-10 ENCOUNTER — Other Ambulatory Visit: Payer: Self-pay | Admitting: Internal Medicine

## 2022-12-21 DIAGNOSIS — H40013 Open angle with borderline findings, low risk, bilateral: Secondary | ICD-10-CM | POA: Diagnosis not present

## 2022-12-21 DIAGNOSIS — H2513 Age-related nuclear cataract, bilateral: Secondary | ICD-10-CM | POA: Diagnosis not present

## 2023-01-12 DIAGNOSIS — L820 Inflamed seborrheic keratosis: Secondary | ICD-10-CM | POA: Diagnosis not present

## 2023-01-12 DIAGNOSIS — D485 Neoplasm of uncertain behavior of skin: Secondary | ICD-10-CM | POA: Diagnosis not present

## 2023-01-12 DIAGNOSIS — D3617 Benign neoplasm of peripheral nerves and autonomic nervous system of trunk, unspecified: Secondary | ICD-10-CM | POA: Diagnosis not present

## 2023-01-28 ENCOUNTER — Other Ambulatory Visit (HOSPITAL_COMMUNITY): Payer: Self-pay | Admitting: Cardiovascular Disease

## 2023-01-28 DIAGNOSIS — I480 Paroxysmal atrial fibrillation: Secondary | ICD-10-CM

## 2023-01-28 NOTE — Telephone Encounter (Signed)
Prescription refill request for Eliquis received. Indication: Afib  Last office visit: 03/25/22 ([facility])  Scr: 0.96 (12/21/21)  Age: 82 Weight: 55.5kg  Labs overdue. Called and spoke with PCP office. Labs were drawn on September, 2023 and also has lab order for August, 2024. Requested for labs to be sent to [facility].

## 2023-01-28 NOTE — Telephone Encounter (Signed)
Scr 1.16 on 01/30/22. Refill sent.

## 2023-01-28 NOTE — Telephone Encounter (Addendum)
Spoke with Dana Pruitt at [facility] and she states the pt had some recent labs done & she will fax over the results.

## 2023-02-02 ENCOUNTER — Other Ambulatory Visit: Payer: Self-pay | Admitting: Internal Medicine

## 2023-02-03 ENCOUNTER — Telehealth: Payer: Self-pay | Admitting: Internal Medicine

## 2023-02-03 NOTE — Telephone Encounter (Signed)
Patient states needs refill for Breztri inhaler. Pharmacy is [facility] [address]. Patient phone number is [phone].

## 2023-02-05 DIAGNOSIS — I10 Essential (primary) hypertension: Secondary | ICD-10-CM | POA: Diagnosis not present

## 2023-02-05 DIAGNOSIS — D649 Anemia, unspecified: Secondary | ICD-10-CM | POA: Diagnosis not present

## 2023-02-05 DIAGNOSIS — M810 Age-related osteoporosis without current pathological fracture: Secondary | ICD-10-CM | POA: Diagnosis not present

## 2023-02-08 NOTE — Telephone Encounter (Signed)
ATC X1-someone picked up the phone, I said hello and they hung up. Please advise Dana Pruitt has been sent to pharmacy

## 2023-02-10 DIAGNOSIS — M810 Age-related osteoporosis without current pathological fracture: Secondary | ICD-10-CM | POA: Diagnosis not present

## 2023-02-10 DIAGNOSIS — J449 Chronic obstructive pulmonary disease, unspecified: Secondary | ICD-10-CM | POA: Diagnosis not present

## 2023-02-10 DIAGNOSIS — I10 Essential (primary) hypertension: Secondary | ICD-10-CM | POA: Diagnosis not present

## 2023-02-10 DIAGNOSIS — I480 Paroxysmal atrial fibrillation: Secondary | ICD-10-CM | POA: Diagnosis not present

## 2023-02-10 DIAGNOSIS — Z0000 Encounter for general adult medical examination without abnormal findings: Secondary | ICD-10-CM | POA: Diagnosis not present

## 2023-02-10 DIAGNOSIS — B078 Other viral warts: Secondary | ICD-10-CM | POA: Diagnosis not present

## 2023-02-10 LAB — LAB REPORT - SCANNED: EGFR (Non-African Amer.): 49

## 2023-02-15 ENCOUNTER — Telehealth: Payer: Self-pay | Admitting: Hematology and Oncology

## 2023-02-15 NOTE — Telephone Encounter (Signed)
Rescheduled appointments per incoming call. Patient is aware of the changes made to her upcoming appointments.

## 2023-02-16 DIAGNOSIS — Z853 Personal history of malignant neoplasm of breast: Secondary | ICD-10-CM | POA: Diagnosis not present

## 2023-02-16 DIAGNOSIS — M8588 Other specified disorders of bone density and structure, other site: Secondary | ICD-10-CM | POA: Diagnosis not present

## 2023-02-16 DIAGNOSIS — Z8262 Family history of osteoporosis: Secondary | ICD-10-CM | POA: Diagnosis not present

## 2023-02-25 DIAGNOSIS — S65311A Laceration of deep palmar arch of right hand, initial encounter: Secondary | ICD-10-CM | POA: Diagnosis not present

## 2023-03-09 ENCOUNTER — Other Ambulatory Visit: Payer: Medicare Other

## 2023-03-09 ENCOUNTER — Encounter: Payer: Medicare Other | Admitting: Hematology and Oncology

## 2023-03-15 ENCOUNTER — Other Ambulatory Visit: Payer: Self-pay | Admitting: Nurse Practitioner

## 2023-03-16 ENCOUNTER — Other Ambulatory Visit: Payer: Medicare Other

## 2023-03-16 ENCOUNTER — Encounter: Payer: Medicare Other | Admitting: Hematology and Oncology

## 2023-03-16 ENCOUNTER — Other Ambulatory Visit: Payer: Self-pay | Admitting: Internal Medicine

## 2023-03-23 ENCOUNTER — Inpatient Hospital Stay: Payer: Medicare Other | Attending: Hematology and Oncology

## 2023-03-23 ENCOUNTER — Inpatient Hospital Stay: Payer: Medicare Other | Admitting: Hematology and Oncology

## 2023-03-23 ENCOUNTER — Encounter: Payer: Self-pay | Admitting: Hematology and Oncology

## 2023-03-23 VITALS — BP 127/80 | HR 83 | Temp 97.7°F | Resp 18 | Wt 123.8 lb

## 2023-03-23 DIAGNOSIS — I480 Paroxysmal atrial fibrillation: Secondary | ICD-10-CM | POA: Insufficient documentation

## 2023-03-23 DIAGNOSIS — D509 Iron deficiency anemia, unspecified: Secondary | ICD-10-CM | POA: Insufficient documentation

## 2023-03-23 DIAGNOSIS — Z853 Personal history of malignant neoplasm of breast: Secondary | ICD-10-CM | POA: Insufficient documentation

## 2023-03-23 DIAGNOSIS — Z79899 Other long term (current) drug therapy: Secondary | ICD-10-CM | POA: Insufficient documentation

## 2023-03-23 DIAGNOSIS — J449 Chronic obstructive pulmonary disease, unspecified: Secondary | ICD-10-CM | POA: Diagnosis not present

## 2023-03-23 DIAGNOSIS — Z923 Personal history of irradiation: Secondary | ICD-10-CM | POA: Diagnosis not present

## 2023-03-23 DIAGNOSIS — Z7901 Long term (current) use of anticoagulants: Secondary | ICD-10-CM | POA: Insufficient documentation

## 2023-03-23 DIAGNOSIS — Z87891 Personal history of nicotine dependence: Secondary | ICD-10-CM | POA: Diagnosis not present

## 2023-03-23 LAB — CMP (CANCER CENTER ONLY)
ALT: 12 U/L (ref 0–44)
AST: 16 U/L (ref 15–41)
Albumin: 4.7 g/dL (ref 3.5–5.0)
Alkaline Phosphatase: 35 U/L — ABNORMAL LOW (ref 38–126)
Anion gap: 5 (ref 5–15)
BUN: 18 mg/dL (ref 8–23)
CO2: 28 mmol/L (ref 22–32)
Calcium: 10.5 mg/dL — ABNORMAL HIGH (ref 8.9–10.3)
Chloride: 107 mmol/L (ref 98–111)
Creatinine: 1.19 mg/dL — ABNORMAL HIGH (ref 0.44–1.00)
GFR, Estimated: 46 mL/min — ABNORMAL LOW (ref >60)
Glucose, Bld: 109 mg/dL — ABNORMAL HIGH (ref 70–99)
Potassium: 4.5 mmol/L (ref 3.5–5.1)
Sodium: 140 mmol/L (ref 135–145)
Total Bilirubin: 0.6 mg/dL (ref 0.3–1.2)
Total Protein: 6.9 g/dL (ref 6.5–8.1)

## 2023-03-23 LAB — CBC WITH DIFFERENTIAL/PLATELET
Abs Immature Granulocytes: 0.03 10*3/uL (ref 0.00–0.07)
Basophils Absolute: 0 10*3/uL (ref 0.0–0.1)
Basophils Relative: 1 %
Eosinophils Absolute: 0.2 10*3/uL (ref 0.0–0.5)
Eosinophils Relative: 4 %
HCT: 38.7 % (ref 36.0–46.0)
Hemoglobin: 12.2 g/dL (ref 12.0–15.0)
Immature Granulocytes: 1 %
Lymphocytes Relative: 17 %
Lymphs Abs: 1 10*3/uL (ref 0.7–4.0)
MCH: 29.6 pg (ref 26.0–34.0)
MCHC: 31.5 g/dL (ref 30.0–36.0)
MCV: 93.9 fL (ref 80.0–100.0)
Monocytes Absolute: 0.5 10*3/uL (ref 0.1–1.0)
Monocytes Relative: 9 %
Neutro Abs: 3.8 10*3/uL (ref 1.7–7.7)
Neutrophils Relative %: 68 %
Platelets: 207 10*3/uL (ref 150–400)
RBC: 4.12 MIL/uL (ref 3.87–5.11)
RDW: 19.7 % — ABNORMAL HIGH (ref 11.5–15.5)
WBC: 5.5 10*3/uL (ref 4.0–10.5)
nRBC: 0 % (ref 0.0–0.2)

## 2023-03-23 LAB — IRON AND IRON BINDING CAPACITY (CC-WL,HP ONLY)
Iron: 105 ug/dL (ref 28–170)
Saturation Ratios: 25 % (ref 10.4–31.8)
TIBC: 419 ug/dL (ref 250–450)
UIBC: 314 ug/dL (ref 148–442)

## 2023-03-23 LAB — VITAMIN B12: Vitamin B-12: 145 pg/mL — ABNORMAL LOW (ref 180–914)

## 2023-03-23 LAB — FERRITIN: Ferritin: 24 ng/mL (ref 11–307)

## 2023-03-23 NOTE — Progress Notes (Signed)
[facility] CONSULT NOTE  Patient Care Team: Merri Brunette, MD as PCP - General Allyson Sabal Delton See, MD as PCP - Cardiology (Cardiology)  CHIEF COMPLAINTS/PURPOSE OF CONSULTATION:  IDA  ASSESSMENT & PLAN:  This is a very pleasant 82 yr female pt with PMH significant for right-sided breast cancer, A-fib on anticoagulation, COPD, iron deficiency anemia referred to hematology for evaluation and recommendations regarding the anemia.  Iron Deficiency Anemia Persistent fatigue and history of poor diet. Iron supplementation was taken inconsistently for a short duration. Iron saturation is low on her most recent labs.  Hemoglobin was 9.9, normocytic normochromic.[name] Discussed the need for consistent and prolonged iron supplementation. Also discussed the possibility of underlying causes such as GI bleed, especially considering the patient's age and use of anticoagulants which could contribute to the anemia. -Resume iron supplementation every other day. -Order blood work to reassess iron levels and check for other potential causes of anemia (e.g., B12 deficiency, kidney function, folic acid, SPEP, CMP). -Consider referral to gastroenterology for evaluation of potential GI bleed. -Patient is very anxious about the possibility of other etiologies for iron deficiency anemia but is agreeable to trying the iron back and coming back for follow-up in a few weeks.  Atrial Fibrillation Managed with Eliquis. No recent changes or complications reported. -Continue Eliquis as prescribed.  COPD Managed with inhalers. No recent changes or complications reported. -Continue current management.  Breast Cancer (history) Lumpectomy and radiation completed in 2011, Femara taken for five years until 2016. Regular mammograms reported with no recent concerns. -Continue regular mammograms as per guidelines.  Follow-up in three weeks to review blood work results and reassess anemia.   HISTORY OF PRESENTING  ILLNESS:  Dana Pruitt 82 y.o. female is here because of IDA  Discussed the use of AI scribe software for clinical note transcription with the patient, who gave verbal consent to proceed.  The patient, an elderly woman with a history of breast cancer, aortic valve replacement, stroke, and COPD, presents with fatigue and anemia. She reports a poor diet, with a lack of meat intake and irregular meals. This has been a long-standing issue. She has been taking iron pills for a few weeks but stopped them three weeks ago. She reports feeling extremely tired, which has affected her ability to do things with her grandchildren. She denies any recent weight loss, change in bowel habits, or urinary issues. She has a history of AFib and is currently on Eliquis. She also has a history of smoking but quit 30 years ago. She has no known kidney issues. She has had a few episodes of stroke, one of which occurred during her heart surgery. She also reports a history of breast cancer, which was treated with Femara, surgery, and radiation. She has been doing regular mammograms since then, which have been normal.  She denies any recent colonoscopy. Rest of the pertinent 10 point ROS reviewed and neg.  REVIEW OF SYSTEMS:   Constitutional: Denies fevers, chills or abnormal night sweats Eyes: Denies blurriness of vision, double vision or watery eyes Ears, nose, mouth, throat, and face: Denies mucositis or sore throat Respiratory: Denies cough, dyspnea or wheezes Cardiovascular: Denies palpitation, chest discomfort or lower extremity swelling Gastrointestinal:  Denies nausea, heartburn or change in bowel habits Skin: Denies abnormal skin rashes Lymphatics: Denies new lymphadenopathy or easy bruising Neurological:Denies numbness, tingling or new weaknesses Behavioral/Psych: Mood is stable, no new changes  All other systems were reviewed with the patient and are negative.  MEDICAL HISTORY:  Past Medical History:   Diagnosis Date   Anemia    "several times over the years" (10/17/2012)   Anxiety    Breast cancer (HCC) 02/2010   right breast   CAD (coronary artery disease)    a. LHC 12/2018: mild non-obstructive CAD   Carcinoma of breast treated with adjuvant hormone therapy (HCC) 02/2010   Femara   Chronic systolic CHF    a. Echo in 2015: LVEF of 35-40%, b. Echo in 12/2021: LVEF of 60-65%   COPD (chronic obstructive pulmonary disease) (HCC)    Depression    GERD (gastroesophageal reflux disease)    diet controlled, no med   Hollenhorst plaque, left eye    apparently resolved at recent ophthalmology visit   Hyperlipidemia    Hypertension    IDC, RightStage II, 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   Moderate aortic insufficiency    Non-ischemic cardiomyopathy (HCC)    Paroxysmal atrial fibrillation (HCC)    Pneumonia    "that's why I'm here" (10/17/2012)   S/P AVR (aortic valve replacement) 04/26/2019   Stroke (HCC)    12/2021    SURGICAL HISTORY: Past Surgical History:  Procedure Laterality Date   AORTIC VALVE REPLACEMENT N/A 01/31/2019   Procedure: AORTIC VALVE REPLACEMENT (AVR) USING 21 MM INSPIRIS RESILIS AORTIC VALVE. SN: [id number];  Surgeon: Donata Clay, Theron Arista, MD;  Location: [facility] OR;  Service: Open Heart Surgery;  Laterality: N/A;   APPENDECTOMY  1974   BREAST BIOPSY Right 02/27/10   Needle core Biopsy; Invasive Mammary; ER/PR Positive, Her-2 Neu negative, Ki-67 22%   BREAST LUMPECTOMY WITH SENTINEL LYMPH NODE BIOPSY Right 07/03/11   Invasive Ductal Carcinoma;0/3 nodes negative,; ER,PR Positive, Her-2 Neg; Ki-67 22%   CESAREAN SECTION  1970; 1974   COLONOSCOPY     DILATION AND CURETTAGE OF UTERUS  1970's   RIGHT/LEFT HEART CATH AND CORONARY ANGIOGRAPHY N/A 01/02/2019   Procedure: RIGHT/LEFT HEART CATH AND CORONARY ANGIOGRAPHY;  Surgeon: Laurey Morale, MD;  Location: [facility] INVASIVE CV LAB;  Service: Cardiovascular;  Laterality: N/A;   TEE WITHOUT CARDIOVERSION N/A 01/31/2019   Procedure:  TRANSESOPHAGEAL ECHOCARDIOGRAM (TEE);  Surgeon: Donata Clay, Theron Arista, MD;  Location: [facility] OR;  Service: Open Heart Surgery;  Laterality: N/A;   TUBAL LIGATION  1974   WISDOM TOOTH EXTRACTION      SOCIAL HISTORY: Social History   Socioeconomic History   Marital status: Married    Spouse name: Not on file   Number of children: 2   Years of education: college   Highest education level: Not on file  Occupational History    Employer: RETIRED  Tobacco Use   Smoking status: Former    Current packs/day: 0.00    Average packs/day: 3.0 packs/day for 32.1 years (96.3 ttl pk-yrs)    Types: Cigarettes    Start date: 05/19/1958    Quit date: 06/30/1990    Years since quitting: 32.7   Smokeless tobacco: Never   Tobacco comments:    Stopped smoking for 2-3 years during second child.   Vaping Use   Vaping status: Never Used  Substance and Sexual Activity   Alcohol use: Yes    Comment: 1-2 glasses wine/month   Drug use: No   Sexual activity: Yes    Birth control/protection: Post-menopausal    Comment: Menarche age 71, G45,P2, Menopause in 51"s, No HRT Married  Other Topics Concern   Not on file  Social History Narrative   [facility] (12/29/16):   Originally from [place], [place].  Moved to  at 82 y.o. Does have a dog. Hasn't worked outside the home. Remote parakeet exposure from her children. No mold or hot tub exposure. Enjoys doing yard work & walking.    Social Determinants of Health   Financial Resource Strain: Not on file  Food Insecurity: Low Risk  (11/05/2022)   [facility]   Hunger Vital Sign    Worried About Running Out of Food in the Last Year: Never true    Ran Out of Food in the Last Year: Never true  Transportation Needs: No Transportation Needs (11/05/2022)   [facility]   Transportation    In the past 12 months, has lack of reliable transportation kept you from medical appointments, meetings, work or from getting  things needed for daily living? : No  Physical Activity: Not on file  Stress: Not on file  Social Connections: Unknown (02/25/2023)   [facility] from [facility]   Social Network    Social Network: Not on file  Intimate Partner Violence: Unknown (02/25/2023)   Received from [facility]   HITS    Physically Hurt: Not on file    Insult or Talk Down To: Not on file    Threaten Physical Harm: Not on file    Scream or Curse: Not on file    FAMILY HISTORY: Family History  Problem Relation Age of Onset   Heart disease Mother    Cancer Father    Heart disease Maternal Grandmother    Heart disease Maternal Grandfather    Cancer Paternal Grandmother    Lung disease Neg Hx     ALLERGIES:  has No Known Allergies.  MEDICATIONS:  Current Outpatient Medications  Medication Sig Dispense Refill   albuterol (PROAIR HFA) 108 (90 Base) MCG/ACT inhaler 2 puffs every 4 hours as needed only  if your can't catch your breath 1 each 6   atorvastatin (LIPITOR) 20 MG tablet Take 20 mg by mouth daily.      BREZTRI AEROSPHERE 160-9-4.8 MCG/ACT AERO INHALE 2 PUFFS INTO THE LUNGS TWICE DAILY 10.7 g 11   carvedilol (COREG) 6.25 MG tablet TAKE 1 AND 1/2 TABLETS(9.375 MG) BY MOUTH TWICE DAILY WITH A MEAL 45 tablet 6   denosumab (PROLIA) 60 MG/ML SOSY injection Inject 60 mg into the skin every 6 (six) months.     diltiazem (CARDIZEM) 30 MG tablet Take 1 tablet every 4 hours AS NEEDED for AFIB heart rate >100 45 tablet 1   ELIQUIS 2.5 MG TABS tablet TAKE 1 TABLET(2.5 MG) BY MOUTH TWICE DAILY 180 tablet 1   famotidine (PEPCID) 20 MG tablet One after supper 30 tablet 11   fluticasone (FLONASE) 50 MCG/ACT nasal spray SHAKE LIQUID AND USE 2 SPRAYS IN EACH NOSTRIL DAILY 16 g 3   guaiFENesin (MUCINEX) 600 MG 12 hr tablet Take 1 tablet (600 mg total) by mouth 2 (two) times daily. 30 tablet 0   pantoprazole (PROTONIX) 40 MG tablet Take 1 tablet (40 mg total) by mouth daily. Take 30-60 min before first meal of the day 30  tablet 2   sertraline (ZOLOFT) 100 MG tablet Take 100 mg by mouth daily.     No current facility-administered medications for this visit.   Facility-Administered Medications Ordered in Other Visits  Medication Dose Route Frequency Provider Last Rate Last Admin   sodium chloride flush (NS) 0.9 % injection 3 mL  3 mL Intravenous Q12H Laurey Morale, MD  PHYSICAL EXAMINATION: ECOG PERFORMANCE STATUS: 1 - Symptomatic but completely ambulatory  Vitals:   03/23/23 1001  BP: 127/80  Pulse: 83  Resp: 18  Temp: 97.7 F (36.5 C)  SpO2: 90%   Filed Weights   03/23/23 1001  Weight: 123 lb 12.8 oz (56.2 kg)    GENERAL:alert, no distress and comfortable SKIN: skin color, texture, turgor are normal, no rashes or significant lesions EYES: normal, conjunctiva are pink and non-injected, sclera clear OROPHARYNX:no exudate, no erythema and lips, buccal mucosa, and tongue normal  NECK: supple, thyroid normal size, non-tender, without nodularity LYMPH:  no palpable lymphadenopathy in the cervical, axillary  LUNGS: clear to auscultation and percussion with normal breathing effort HEART: regular rate & rhythm and no murmurs and no lower extremity edema ABDOMEN:abdomen soft, non-tender and normal bowel sounds Musculoskeletal:no cyanosis of digits and no clubbing  PSYCH: alert & oriented x 3 with fluent speech NEURO: no focal motor/sensory deficits Breasts: bilateral breasts examined. Rt breast with organized seroma, surgical scar. No palpable masses. No regional adenopathy. Left breast normal to inspection and palpation  LABORATORY DATA:  I have reviewed the data as listed Lab Results  Component Value Date   WBC 4.5 12/21/2021   HGB 11.8 (L) 12/21/2021   HCT 35.0 (L) 12/21/2021   MCV 91.6 12/21/2021   PLT 197 12/21/2021     Chemistry      Component Value Date/Time   NA 138 12/21/2021 0255   NA 139 12/11/2021 1003   NA 138 11/27/2014 1017   K 3.5 12/21/2021 0255   K 4.9  11/27/2014 1017   CL 109 12/21/2021 0255   CL 105 03/31/2012 1410   CO2 22 12/21/2021 0255   CO2 25 11/27/2014 1017   BUN 11 12/21/2021 0255   BUN 15 12/11/2021 1003   BUN 18.7 11/27/2014 1017   CREATININE 0.96 12/21/2021 0255   CREATININE 1.0 11/27/2014 1017      Component Value Date/Time   CALCIUM 9.0 12/21/2021 0255   CALCIUM 10.1 11/27/2014 1017   ALKPHOS 30 (L) 12/21/2021 0255   ALKPHOS 35 (L) 11/27/2014 1017   AST 13 (L) 12/21/2021 0255   AST 19 11/27/2014 1017   ALT 12 12/21/2021 0255   ALT 12 11/27/2014 1017   BILITOT 0.7 12/21/2021 0255   BILITOT 0.45 11/27/2014 1017       RADIOGRAPHIC STUDIES: I have personally reviewed the radiological images as listed and agreed with the findings in the report. No results found.  All questions were answered. The patient knows to call the clinic with any problems, questions or concerns. I spent 45 minutes in the care of this patient including H and P, review of records, counseling and coordination of care.     Rachel Moulds, MD 03/23/2023 10:12 AM

## 2023-03-24 LAB — FOLATE RBC
Folate, Hemolysate: 334 ng/mL
Folate, RBC: 868 ng/mL (ref >498)
Hematocrit: 38.5 % (ref 34.0–46.6)

## 2023-03-24 LAB — KAPPA/LAMBDA LIGHT CHAINS
Kappa free light chain: 23 mg/L — ABNORMAL HIGH (ref 3.3–19.4)
Kappa, lambda light chain ratio: 1.18 (ref 0.26–1.65)
Lambda free light chains: 19.5 mg/L (ref 5.7–26.3)

## 2023-03-25 LAB — PROTEIN ELECTROPHORESIS, SERUM, WITH REFLEX
A/G Ratio: 1.6 (ref 0.7–1.7)
Albumin ELP: 4.1 g/dL (ref 2.9–4.4)
Alpha-1-Globulin: 0.3 g/dL (ref 0.0–0.4)
Alpha-2-Globulin: 0.7 g/dL (ref 0.4–1.0)
Beta Globulin: 0.9 g/dL (ref 0.7–1.3)
Gamma Globulin: 0.7 g/dL (ref 0.4–1.8)
Globulin, Total: 2.6 g/dL (ref 2.2–3.9)
Total Protein ELP: 6.7 g/dL (ref 6.0–8.5)

## 2023-03-26 ENCOUNTER — Encounter: Payer: Self-pay | Admitting: *Deleted

## 2023-04-13 ENCOUNTER — Inpatient Hospital Stay: Payer: Medicare Other | Admitting: Hematology and Oncology

## 2023-04-13 ENCOUNTER — Inpatient Hospital Stay: Payer: Medicare Other

## 2023-04-13 VITALS — BP 131/78 | HR 72 | Temp 98.1°F | Resp 18 | Wt 123.9 lb

## 2023-04-13 DIAGNOSIS — J449 Chronic obstructive pulmonary disease, unspecified: Secondary | ICD-10-CM | POA: Diagnosis not present

## 2023-04-13 DIAGNOSIS — Z79899 Other long term (current) drug therapy: Secondary | ICD-10-CM | POA: Diagnosis not present

## 2023-04-13 DIAGNOSIS — D509 Iron deficiency anemia, unspecified: Secondary | ICD-10-CM

## 2023-04-13 DIAGNOSIS — I480 Paroxysmal atrial fibrillation: Secondary | ICD-10-CM | POA: Diagnosis not present

## 2023-04-13 DIAGNOSIS — Z923 Personal history of irradiation: Secondary | ICD-10-CM | POA: Diagnosis not present

## 2023-04-13 DIAGNOSIS — Z7901 Long term (current) use of anticoagulants: Secondary | ICD-10-CM | POA: Diagnosis not present

## 2023-04-13 DIAGNOSIS — Z87891 Personal history of nicotine dependence: Secondary | ICD-10-CM | POA: Diagnosis not present

## 2023-04-13 DIAGNOSIS — Z853 Personal history of malignant neoplasm of breast: Secondary | ICD-10-CM | POA: Diagnosis not present

## 2023-04-13 LAB — CBC WITH DIFFERENTIAL/PLATELET
Abs Immature Granulocytes: 0.02 10*3/uL (ref 0.00–0.07)
Basophils Absolute: 0.1 10*3/uL (ref 0.0–0.1)
Basophils Relative: 1 %
Eosinophils Absolute: 0.3 10*3/uL (ref 0.0–0.5)
Eosinophils Relative: 4 %
HCT: 36 % (ref 36.0–46.0)
Hemoglobin: 11.8 g/dL — ABNORMAL LOW (ref 12.0–15.0)
Immature Granulocytes: 0 %
Lymphocytes Relative: 18 %
Lymphs Abs: 1.1 10*3/uL (ref 0.7–4.0)
MCH: 30.8 pg (ref 26.0–34.0)
MCHC: 32.8 g/dL (ref 30.0–36.0)
MCV: 94 fL (ref 80.0–100.0)
Monocytes Absolute: 0.6 10*3/uL (ref 0.1–1.0)
Monocytes Relative: 9 %
Neutro Abs: 4.2 10*3/uL (ref 1.7–7.7)
Neutrophils Relative %: 68 %
Platelets: 221 10*3/uL (ref 150–400)
RBC: 3.83 MIL/uL — ABNORMAL LOW (ref 3.87–5.11)
RDW: 18.8 % — ABNORMAL HIGH (ref 11.5–15.5)
WBC: 6.3 10*3/uL (ref 4.0–10.5)
nRBC: 0 % (ref 0.0–0.2)

## 2023-04-13 LAB — VITAMIN B12: Vitamin B-12: 356 pg/mL (ref 180–914)

## 2023-04-13 NOTE — Progress Notes (Signed)
[facility] CONSULT [facility]  Patient Care Team: Merri Brunette, MD as PCP - General Allyson Sabal Delton See, MD as PCP - Cardiology (Cardiology)  CHIEF COMPLAINTS/PURPOSE OF CONSULTATION:  IDA  ASSESSMENT & PLAN:  This is a very pleasant 82 yr female pt with PMH significant for right-sided breast cancer, A-fib on anticoagulation, COPD, iron deficiency anemia referred to hematology for evaluation and recommendations regarding the anemia.  Iron Deficiency Low normal iron stores with normal hemoglobin. Patient is on ferrous sulfate every other day and has been advised to improve dietary intake. -Continue ferrous sulfate every other day. -Check CBC, iron panel, and ferritin today. -Repeat labs in 3 months.  Vitamin B12 Deficiency Low B12 levels. Patient is on daily B12 supplementation. -Continue B12 daily.  Breast Cancer History Patient is 10 years post breast cancer and continues to have annual mammograms. Last mammogram was recent and normal. -Continue annual mammograms.  Gastroenterology Referral Discussed potential referral due to dietary issues and previous iron deficiency. Decision to defer referral based on improvement in iron levels per pt request. -Consider referral if labs in 3 months show worsening iron deficiency.   HISTORY OF PRESENTING ILLNESS:  Dana Pruitt 82 y.o. female is here because of IDA  Discussed the use of AI scribe software for clinical note transcription with the patient, who gave verbal consent to proceed.  The patient, an elderly woman with a history of breast cancer, aortic valve replacement, stroke, and COPD, presents with fatigue and anemia.   The patient, with a history of iron deficiency and low B12, reports no new hospitalizations or medications since her last visit. She has been adhering to a vegetarian diet and is trying to improve her eating habits. Despite these efforts, she still feels her diet is not as it should be. She has been  taking prescribed B12 supplements and ferrous sulfate every other day. She has also been referred to a gastroenterologist, but has not yet scheduled an appointment. The patient has a history of breast cancer, but her most recent mammogram showed no abnormalities. I dont have access to this mammogram, this was done at [facility].   REVIEW OF SYSTEMS:   Constitutional: Denies fevers, chills or abnormal night sweats Eyes: Denies blurriness of vision, double vision or watery eyes Ears, nose, mouth, throat, and face: Denies mucositis or sore throat Respiratory: Denies cough, dyspnea or wheezes Cardiovascular: Denies palpitation, chest discomfort or lower extremity swelling Gastrointestinal:  Denies nausea, heartburn or change in bowel habits Skin: Denies abnormal skin rashes Lymphatics: Denies new lymphadenopathy or easy bruising Neurological:Denies numbness, tingling or new weaknesses Behavioral/Psych: Mood is stable, no new changes  All other systems were reviewed with the patient and are negative.  MEDICAL HISTORY:  Past Medical History:  Diagnosis Date   Anemia    "several times over the years" (10/17/2012)   Anxiety    Breast cancer (HCC) 02/2010   right breast   CAD (coronary artery disease)    a. LHC 12/2018: mild non-obstructive CAD   Carcinoma of breast treated with adjuvant hormone therapy (HCC) 02/2010   Femara   Chronic systolic CHF    a. Echo in 2015: LVEF of 35-40%, b. Echo in 12/2021: LVEF of 60-65%   COPD (chronic obstructive pulmonary disease) (HCC)    Depression    GERD (gastroesophageal reflux disease)    diet controlled, no med   Hollenhorst plaque, left eye    apparently resolved at recent ophthalmology visit   Hyperlipidemia  Hypertension    IDC, RightStage II, 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   Moderate aortic insufficiency    Non-ischemic cardiomyopathy (HCC)    Paroxysmal atrial fibrillation (HCC)    Pneumonia    "that's why I'm here" (10/17/2012)   S/P AVR  (aortic valve replacement) 04/26/2019   Stroke (HCC)    12/2021    SURGICAL HISTORY: Past Surgical History:  Procedure Laterality Date   AORTIC VALVE REPLACEMENT N/A 01/31/2019   Procedure: AORTIC VALVE REPLACEMENT (AVR) USING 21 MM INSPIRIS RESILIS AORTIC VALVE. SN: [id number];  Surgeon: Donata Clay, Theron Arista, MD;  Location: [facility] OR;  Service: Open Heart Surgery;  Laterality: N/A;   APPENDECTOMY  1974   BREAST BIOPSY Right 02/27/10   Needle core Biopsy; Invasive Mammary; ER/PR Positive, Her-2 Neu negative, Ki-67 22%   BREAST LUMPECTOMY WITH SENTINEL LYMPH NODE BIOPSY Right 07/03/11   Invasive Ductal Carcinoma;0/3 nodes negative,; ER,PR Positive, Her-2 Neg; Ki-67 22%   CESAREAN SECTION  1970; 1974   COLONOSCOPY     DILATION AND CURETTAGE OF UTERUS  1970's   RIGHT/LEFT HEART CATH AND CORONARY ANGIOGRAPHY N/A 01/02/2019   Procedure: RIGHT/LEFT HEART CATH AND CORONARY ANGIOGRAPHY;  Surgeon: Laurey Morale, MD;  Location[facility] INVASIVE CV LAB;  Service: Cardiovascular;  Laterality: N/A;   TEE WITHOUT CARDIOVERSION N/A 01/31/2019   Procedure: TRANSESOPHAGEAL ECHOCARDIOGRAM (TEE);  Surgeon: Donata Clay, Theron Arista, MD;  Location: [facility] OR;  Service: Open Heart Surgery;  Laterality: N/A;   TUBAL LIGATION  1974   WISDOM TOOTH EXTRACTION      SOCIAL HISTORY: Social History   Socioeconomic History   Marital status: Married    Spouse name: Not on file   Number of children: 2   Years of education: college   Highest education level: Not on file  Occupational History    Employer: RETIRED  Tobacco Use   Smoking status: Former    Current packs/day: 0.00    Average packs/day: 3.0 packs/day for 32.1 years (96.3 ttl pk-yrs)    Types: Cigarettes    Start date: 05/19/1958    Quit date: 06/30/1990    Years since quitting: 32.8   Smokeless tobacco: Never   Tobacco comments:    Stopped smoking for 2-3 years during second child.   Vaping Use   Vaping status: Never Used  Substance and Sexual Activity   Alcohol use: Yes     Comment: 1-2 glasses wine/month   Drug use: No   Sexual activity: Yes    Birth control/protection: Post-menopausal    Comment: Menarche age 28, G44,P2, Menopause in 60"s, No HRT Married  Other Topics Concern   Not on file  Social History Narrative   [facility] (12/29/16):   Originally from [place], [place]. Moved to [place] at 82 y.o. Does have a dog. Hasn't worked outside the home. Remote parakeet exposure from her children. No mold or hot tub exposure. Enjoys doing yard work & walking.    Social Determinants of Health   Financial Resource Strain: Not on file  Food Insecurity: Low Risk  (11/05/2022)   [facility]   Hunger Vital Sign    Worried About Running Out of Food in the Last Year: Never true    Ran Out of Food in the Last Year: Never true  Transportation Needs: No Transportation Needs (11/05/2022)   [facility]   Transportation    In the past 12 months, has lack of reliable transportation kept you from medical appointments, meetings, work  or from getting things needed for daily living? : No  Physical Activity: Not on file  Stress: Not on file  Social Connections: Unknown (02/25/2023)   [facility]   Social Network    Social Network: Not on file  Intimate Partner Violence: Unknown (02/25/2023)   Received from [facility]   HITS    Physically Hurt: Not on file    Insult or Talk Down To: Not on file    Threaten Physical Harm: Not on file    Scream or Curse: Not on file    FAMILY HISTORY: Family History  Problem Relation Age of Onset   Heart disease Mother    Cancer Father    Heart disease Maternal Grandmother    Heart disease Maternal Grandfather    Cancer Paternal Grandmother    Lung disease Neg Hx     ALLERGIES:  has No Known Allergies.  MEDICATIONS:  Current Outpatient Medications  Medication Sig Dispense Refill   albuterol (PROAIR HFA) 108 (90 Base) MCG/ACT inhaler 2 puffs every 4  hours as needed only  if your can't catch your breath 1 each 6   atorvastatin (LIPITOR) 20 MG tablet Take 20 mg by mouth daily.      BREZTRI AEROSPHERE 160-9-4.8 MCG/ACT AERO INHALE 2 PUFFS INTO THE LUNGS TWICE DAILY 10.7 g 11   carvedilol (COREG) 6.25 MG tablet TAKE 1 AND 1/2 TABLETS(9.375 MG) BY MOUTH TWICE DAILY WITH A MEAL 45 tablet 6   denosumab (PROLIA) 60 MG/ML SOSY injection Inject 60 mg into the skin every 6 (six) months.     diltiazem (CARDIZEM) 30 MG tablet Take 1 tablet every 4 hours AS NEEDED for AFIB heart rate >100 45 tablet 1   ELIQUIS 2.5 MG TABS tablet TAKE 1 TABLET(2.5 MG) BY MOUTH TWICE DAILY 180 tablet 1   fluticasone (FLONASE) 50 MCG/ACT nasal spray SHAKE LIQUID AND USE 2 SPRAYS IN EACH NOSTRIL DAILY 16 g 3   guaiFENesin (MUCINEX) 600 MG 12 hr tablet Take 1 tablet (600 mg total) by mouth 2 (two) times daily. 30 tablet 0   sertraline (ZOLOFT) 100 MG tablet Take 100 mg by mouth daily.     No current facility-administered medications for this visit.   Facility-Administered Medications Ordered in Other Visits  Medication Dose Route Frequency Provider Last Rate Last Admin   sodium chloride flush (NS) 0.9 % injection 3 mL  3 mL Intravenous Q12H Laurey Morale, MD         PHYSICAL EXAMINATION: ECOG PERFORMANCE STATUS: 1 - Symptomatic but completely ambulatory  Vitals:   04/13/23 1453  BP: 131/78  Pulse: 72  Resp: 18  Temp: 98.1 F (36.7 C)  SpO2: 93%   Filed Weights   04/13/23 1453  Weight: 123 lb 14.4 oz (56.2 kg)    GENERAL:alert, no distress and comfortable  LABORATORY DATA:  I have reviewed the data as listed Lab Results  Component Value Date   WBC 5.5 03/23/2023   HGB 12.2 03/23/2023   HCT 38.5 03/23/2023   MCV 93.9 03/23/2023   PLT 207 03/23/2023     Chemistry      Component Value Date/Time   NA 140 03/23/2023 1117   NA 139 12/11/2021 1003   NA 138 11/27/2014 1017   K 4.5 03/23/2023 1117   K 4.9 11/27/2014 1017   CL 107 03/23/2023 1117    CL 105 03/31/2012 1410   CO2 28 03/23/2023 1117   CO2 25 11/27/2014 1017  BUN 18 03/23/2023 1117   BUN 15 12/11/2021 1003   BUN 18.7 11/27/2014 1017   CREATININE 1.19 (H) 03/23/2023 1117   CREATININE 1.0 11/27/2014 1017      Component Value Date/Time   CALCIUM 10.5 (H) 03/23/2023 1117   CALCIUM 10.1 11/27/2014 1017   ALKPHOS 35 (L) 03/23/2023 1117   ALKPHOS 35 (L) 11/27/2014 1017   AST 16 03/23/2023 1117   AST 19 11/27/2014 1017   ALT 12 03/23/2023 1117   ALT 12 11/27/2014 1017   BILITOT 0.6 03/23/2023 1117   BILITOT 0.45 11/27/2014 1017       RADIOGRAPHIC STUDIES: I have personally reviewed the radiological images as listed and agreed with the findings in the report. No results found.  All questions were answered. The patient knows to call the clinic with any problems, questions or concerns. I spent 30 minutes in the care of this patient including H and P, review of records, counseling and coordination of care.     Rachel Moulds, MD 04/13/2023 3:00 PM

## 2023-04-14 LAB — FERRITIN: Ferritin: 20 ng/mL (ref 11–307)

## 2023-04-19 ENCOUNTER — Telehealth: Payer: Self-pay

## 2023-04-19 NOTE — Telephone Encounter (Signed)
This RN called pt to inform her that her ferritin levels are appx the same as last visit and per Dr. Al Pimple, please continue to take oral iron supplements every other day. Pt verbalized understanding.

## 2023-04-19 NOTE — Telephone Encounter (Signed)
-----   Message from Orchard Iruku sent at 04/19/2023 11:43 AM EDT ----- Ferritin about the same as last visit, I would recommend continuing oral iron every other day.  Thanks,

## 2023-04-26 DIAGNOSIS — M810 Age-related osteoporosis without current pathological fracture: Secondary | ICD-10-CM | POA: Diagnosis not present

## 2023-05-04 DIAGNOSIS — K080 Exfoliation of teeth due to systemic causes: Secondary | ICD-10-CM | POA: Diagnosis not present

## 2023-05-24 DIAGNOSIS — K080 Exfoliation of teeth due to systemic causes: Secondary | ICD-10-CM | POA: Diagnosis not present

## 2023-06-24 ENCOUNTER — Other Ambulatory Visit: Payer: Self-pay | Admitting: Cardiovascular Disease

## 2023-06-24 DIAGNOSIS — R300 Dysuria: Secondary | ICD-10-CM | POA: Diagnosis not present

## 2023-06-24 DIAGNOSIS — N1831 Chronic kidney disease, stage 3a: Secondary | ICD-10-CM | POA: Diagnosis not present

## 2023-06-24 DIAGNOSIS — I10 Essential (primary) hypertension: Secondary | ICD-10-CM | POA: Diagnosis not present

## 2023-06-30 DIAGNOSIS — N1831 Chronic kidney disease, stage 3a: Secondary | ICD-10-CM | POA: Diagnosis not present

## 2023-06-30 DIAGNOSIS — R300 Dysuria: Secondary | ICD-10-CM | POA: Diagnosis not present

## 2023-07-01 DIAGNOSIS — R0902 Hypoxemia: Secondary | ICD-10-CM | POA: Diagnosis not present

## 2023-07-01 DIAGNOSIS — I10 Essential (primary) hypertension: Secondary | ICD-10-CM | POA: Diagnosis not present

## 2023-07-01 DIAGNOSIS — J4521 Mild intermittent asthma with (acute) exacerbation: Secondary | ICD-10-CM | POA: Diagnosis not present

## 2023-07-11 NOTE — Progress Notes (Unsigned)
Dana Pruitt, female    DOB: Mar 15, 1941     MRN: [id number]   Brief patient profile:   83  yowf MM/quit smoking 1992 previously followed by Dr Jamison Neighbor with  GOLD II copd    PFT 01/04/17: FVC 2.17 L (79%) FEV1 1.32 L (64%) FEV1/FVC 0.61 FEF 25-75 0.57 L (34%) negative bronchodilator response TLC 5.23 L (105%) RV 136% ERV 39% DLCO corrected 43% 12/30/12: FVC 2.77 L (101%) FEV1 1.92 L (89%) FEV1/FVC 0.69 FEF 25-75 1.13 L (51%) negative bronchodilator response TLC 4.77 L (97%) RV 110% DLCO uncorrected 51%   03/08/17:  Walked 414 meters / Baseline Sat 96% on RA / Nadir Sat 94% on RA (stopped with 4:37 due to poor pulse ox signal)     CARDIAC TTE (07/14/16):  LV normal in size with EF 60-65% & no regional wall motion abnormalities. Grade 1 diastolic dysfunction. LA & RA normal in size. RV mildly dilated with reduced systolic function. Moderate aortic regurgitation without stenosis. Ascending aorta is mildly dilated with aortic root normal in size. No mitral stenosis or regurgitation. Mild pulmonic regurgitation. No significant tricuspid regurgitation. No pericardial effusion.  Admit date: 01/31/2019 Discharge date: 02/10/2019   Admission Diagnoses: Severe aortic insufficiency   Discharge Diagnoses:    Aortic valve disease     hx The patient is a 83 year old female referred to Dr. Wyline Mood right for evaluation of severe aortic insufficiency.  She has been seen by her cardiologist for several years with serial echocardiogram.  Her most recent echo revealed severe AI with some LV dilatation and reduced LV systolic function.  She underwent right and left heart catheterization and was found to have no significant coronary artery disease.  Right heart catheterization showed normal pressures with mixed venous saturations of 68%.  LVEDP was normal and cardiac index was 2.9.  Pulmonary function studies were performed due to a history of smoking heavy smoking in the past, although really remote.  Her  FEV1 was 1.2 and DLCO was 40%.  A CT scan showed the ascending aorta measuring 4.0 cm.  There was findings of panlobular emphysema noted on the CT scan.  Dr. Maren Beach evaluated the patient and all her studies and recommended proceeding with elective aortic valve replacement and she was admitted this hospitalization for the procedure.     Hospital Course: The patient was admitted electively and on 01/31/2019 taken to the operating room where she underwent Aortic valve replacement with a 21 mm Inspiris Edwards valve..  She tolerated it well and was taken to the surgical intensive care unit in stable condition.   Postoperative hospital course:   The patient has done well postoperatively.  She has remained neurologically intact.  She was weaned from the ventilator using standard protocols without difficulty.  She initially required some inotropic support with dobutamine but this was able to be weaned without difficulty.  She was also started on Midodrine to assist with blood pressure.  She is not felt to be a candidate at this time for ACE inhibitor or ARB.  She does have some postoperative volume overload and is responding to diuretics.  She has had postoperative atrial fibrillation and has been started on an amiodarone drip per usual protocol.  She was transitioned to p.o. amiodarone and subsequently was chemically cardioverted to sinus rhythm with only occasional runs of afib. Metoprolol was uptitrated and additionally she was started on Coumadin.  At the time of discharge the patient was very stable.  Home health arrangements  were also made to have INR checked.  Additionally she did require home oxygen to be arranged with lower sats with ambulation in the qualifying range.  She has known severe COPD.        History of Present Illness  04/19/2019  Pulmonary/ 1st office eval/Dana Pruitt  Chief Complaint  Patient presents with   New Patient (Initial Visit)    Seen in the past by Dr Jamison Neighbor. She states she has  been doing cardiac rehab at [facility] and having issues with DOE and low o2 sats.   Dyspnea:  Was walking in hallways prior to d/c with sats 87% so rec 2lpm sleeping and walking but did not  Use it that way and in rehab same problem so started on 2lpm but has been  weaned to 1pm  At home does dog walking up and down hills,  Finds talking and walking are tough/ has not noticed hoarseness  Cough: no Sleep: flat  SABA use: none  - has not helped. rec Try off lisinopril for now  Start Micardis 80 mg one daily x 4 weeks  Make sure you check your oxygen saturations at highest level of activity to be sure it stays over 90% and adjust upward to maintain this level if needed but remember to turn it back to previous settings when you stop (to conserve your supply).        01/26/2022  f/u ov/Dana Pruitt re: GOLD 2 copd/ spn  maint on trelegy 100   Chief Complaint  Patient presents with   Follow-up    Pt states needed f/u here per cardiology regarding 01/01/22 Coronary CT. Breathing is doing well and she denies any respiratory co's.   Dyspnea:  treadmill  3-4 per week up  to 30 min flat 2.5 mph Cough: no production  Sleeping: flat with one pillow s noct cc  SABA use: no hfa or neb  02: none  Rec Plan A = Automatic = Always=    stop trelegy and start breztri Take up to  2 puffs first thing in am and then another 2 puffs about 12 hours later( If needed) Work on inhaler technique:  Plan B = Backup (to supplement plan A, not to replace it) Only use your albuterol inhaler as a rescue medication  We will put you in a computer for recall on Jul 04 2022 for repeat CT chest     04/23/2022  f/u ov/Dana Pruitt re: COPD 2  maint on breztri 2bid  / hoarseness no better Chief Complaint  Patient presents with   Follow-up   Dyspnea:  no change treadmill in terms of ex tol  Cough: no cough just hoarseness despite arm and hammer Sleeping: flat bed one pillow s resp cc  SABA use: not needing  02: none  Rec  Pantoprazole  (protonix) 40 mg   Take  30-60 min before first meal of the day and Pepcid (famotidine)  20 mg after supper until return to office   GERD diet reviewed, bed blocks rec  Work on inhaler technique:   Reduce breztri to 2 puffs each am to see if makes any difference Keep your ENT appt > did not do / says hoarseness improved on its own   07/13/2023  f/u ov/Dana Pruitt re: COPD GOLD 2/ SPN   maint on Breztri   Chief Complaint  Patient presents with   Follow-up    SOB with exertion and low SaO2 persistent.  Sx since heart surgery 01/2019.  Normal SaO2 is  91% for her per patient.  Dyspnea:  treadmill not using / extremely sedentary  Cough: none  Sleeping: flat bed one pillow s  resp cc  SABA use: 3-4 x per day always p exertion  02: none    No obvious day to day or daytime variability or assoc excess/ purulent sputum or mucus plugs or hemoptysis or cp or chest tightness, subjective wheeze or overt sinus or hb symptoms.    Also denies any obvious fluctuation of symptoms with weather or environmental changes or other aggravating or alleviating factors except as outlined above   No unusual exposure hx or h/o childhood pna/ asthma or knowledge of premature birth.  Current Allergies, Complete Past Medical History, Past Surgical History, Family History, and Social History were reviewed in [employer] record.  ROS  The following are not active complaints unless bolded Hoarseness, sore throat, dysphagia, dental problems, itching, sneezing,  nasal congestion or discharge of excess mucus or purulent secretions, ear ache,   fever, chills, sweats, unintended wt loss or wt gain, classically pleuritic or exertional cp,  orthopnea pnd or arm/hand swelling  or leg swelling, presyncope, palpitations, abdominal pain, anorexia, nausea, vomiting, diarrhea  or change in bowel habits or change in bladder habits, change in stools or change in urine, dysuria, hematuria,  rash, arthralgias, visual  complaints, headache, numbness, weakness or ataxia or problems with walking or coordination,  change in mood= anxious or  memory.        Current Meds  Medication Sig   albuterol (PROAIR HFA) 108 (90 Base) MCG/ACT inhaler 2 puffs every 4 hours as needed only  if your can't catch your breath   atorvastatin (LIPITOR) 20 MG tablet Take 20 mg by mouth daily.    BREZTRI AEROSPHERE 160-9-4.8 MCG/ACT AERO INHALE 2 PUFFS INTO THE LUNGS TWICE DAILY   carvedilol (COREG) 6.25 MG tablet TAKE 1 AND 1/2 TABLETS(9.375 MG) BY MOUTH TWICE DAILY WITH A MEAL   denosumab (PROLIA) 60 MG/ML SOSY injection Inject 60 mg into the skin every 6 (six) months.   diltiazem (CARDIZEM) 30 MG tablet Take 1 tablet every 4 hours AS NEEDED for AFIB heart rate >100   ELIQUIS 2.5 MG TABS tablet TAKE 1 TABLET(2.5 MG) BY MOUTH TWICE DAILY   ezetimibe (ZETIA) 10 MG tablet Take 10 mg by mouth daily.   FEROSUL 325 (65 Fe) MG tablet Take 325 mg by mouth every other day.   fluticasone (FLONASE) 50 MCG/ACT nasal spray SHAKE LIQUID AND USE 2 SPRAYS IN EACH NOSTRIL DAILY   guaiFENesin (MUCINEX) 600 MG 12 hr tablet Take 1 tablet (600 mg total) by mouth 2 (two) times daily.   losartan (COZAAR) 25 MG tablet Take 25 mg by mouth daily.   nitrofurantoin (MACRODANTIN) 100 MG capsule Take 100 mg by mouth at bedtime.   sertraline (ZOLOFT) 100 MG tablet Take 100 mg by mouth daily.                Past Medical History:  Diagnosis Date   Anemia    "several times over the years" (10/17/2012)   Anxiety    Breast cancer (HCC) 02/2010   right breast   Carcinoma of breast treated with adjuvant hormone therapy (HCC) 9/11   Femara   CHF (congestive heart failure) (HCC)    COPD (chronic obstructive pulmonary disease) (HCC)    Depression    Exertional shortness of breath    "recently" (10/17/2012)   GERD (gastroesophageal reflux disease)    diet  controlled, no med   Hollenhorst plaque, left eye    apparently resolved at recent ophthalmology visit    Hyperlipidemia    Hypertension    IDC, RightStage II, 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       Objective:    Wts  04/23/2022        122  01/26/2022          122  09/15/2021        126  09/04/2020        122  08/29/2019          126  05/31/19 125 lb (56.7 kg)  05/24/19 125 lb 10.6 oz (57 kg)  04/26/19 122 lb 9.6 oz (55.6 kg)     Vital signs reviewed  07/13/2023  - Note at rest 02 sats  91% on RA   General appearance:    slt hoarse amb wf nad   HEENT : Oropharynx  clear   Nasal turbinates nl    NECK :  without  apparent JVD/ palpable Nodes/TM    LUNGS: no acc muscle use,  Mild barrel  contour chest wall with bilateral  Distant bs s audible wheeze and  without cough on insp or exp maneuvers  and mild  Hyperresonant  to  percussion bilaterally     CV:  RRR  no s3 or murmur or increase in P2, and no edema   ABD:  soft and nontender with pos end  insp Hoover's  in the supine position.  No bruits or organomegaly appreciated   MS:  Nl gait/ ext warm without deformities Or obvious joint restrictions  calf tenderness, cyanosis or clubbing     SKIN: warm and dry without lesions    NEURO:  alert, approp, nl sensorium with  no motor or cerebellar deficits apparent.            Assessment

## 2023-07-13 ENCOUNTER — Encounter: Payer: Self-pay | Admitting: Internal Medicine

## 2023-07-13 ENCOUNTER — Ambulatory Visit: Payer: Medicare Other | Admitting: Internal Medicine

## 2023-07-13 VITALS — BP 108/70 | HR 71 | Temp 97.8°F | Ht 64.0 in | Wt 123.6 lb

## 2023-07-13 DIAGNOSIS — J9611 Chronic respiratory failure with hypoxia: Secondary | ICD-10-CM

## 2023-07-13 DIAGNOSIS — J449 Chronic obstructive pulmonary disease, unspecified: Secondary | ICD-10-CM | POA: Diagnosis not present

## 2023-07-13 NOTE — Patient Instructions (Signed)
Treadmill build up to 30 minutes  3 x weekly or call for referral to pulmonary rehab.  Goal is to keep 02 level above 89%   Please schedule a follow up visit in 3 months but call sooner if needed

## 2023-07-14 ENCOUNTER — Telehealth: Payer: Self-pay | Admitting: Internal Medicine

## 2023-07-14 DIAGNOSIS — J449 Chronic obstructive pulmonary disease, unspecified: Secondary | ICD-10-CM

## 2023-07-14 NOTE — Assessment & Plan Note (Signed)
Quit smoking 1992  01/04/17: FVC 2.17 L (79%) FEV1 1.32 L (64%) FEV1/FVC 0.61 FEF 25-75 0.57 L (34%) negative bronchodilator response TLC 5.23 L (105%) RV 136% ERV 39% DLCO corrected 43% - alpha one AT  03/08/17   MM  Level 164  - PFT's  01/05/2019  FEV1 1.30 (64 % ) ratio 0.62  p 6 % improvement from saba p ? prior to study with DLCO  7.27 (39%) corrects to 2.15 (52%)  for alv volume and FV curve classic concave pattern - 05/31/2019  After extensive coaching inhaler device,  effectiveness =    75% (delayed insp) with SMI > try stiolto 2 puffs each am > improved - 09/04/2020 changed to trelegy200 during  [place] > rec trelegy 100 maint  - 01/26/22 changed to breztri > no better hoarseness > ent eval > declined    Group D (now reclassified as E) in terms of symptom/risk and laba/lama/ICS  therefore appropriate rx at this point >>>  breztri 2bid and more approp saba:  Re SABA :  I spent extra time with pt today reviewing appropriate use of albuterol for prn use on exertion with the following points: 1) saba is for relief of sob that does not improve by walking a slower pace or resting but rather if the pt does not improve after trying this first. 2) If the pt is convinced, as many are, that saba helps recover from activity faster then it's easy to tell if this is the case by re-challenging : ie stop, take the inhaler, then p 5 minutes try the exact same activity (intensity of workload) that just caused the symptoms and see if they are substantially diminished or not after saba 3) if there is an activity that reproducibly causes the symptoms, try the saba 15 min before the activity on alternate days   If in fact the saba really does help, then fine to continue to use it prn but advised may need to look closer at the maintenance regimen being used to achieve better control of airways disease with exertion.    - The proper method of use, as well as anticipated side effects, of a metered-dose inhaler were  discussed and demonstrated to the patient using teach back method. Improved effectiveness after extensive coaching during this visit to a level of approximately 75 % from a baseline of 50 % with hfa  F/u q 3 m, refer to pulmonary rehab if willing (would not commit today)

## 2023-07-14 NOTE — Telephone Encounter (Signed)
Patient would like to do pulmonary rehab program. Patient phone number is [phone].

## 2023-07-14 NOTE — Assessment & Plan Note (Signed)
D/c 01/31/2019  on 02 p AVR  - As of 05/17/2019 rx   titrate with activity to keep sats > 90%  - 05/26/2019 6 min walk needed 2lpm to keep sats over 88% but pt d/c'd 02 on her own - Qualified for amb 02: @ moderate pace. Patient was able to complete 3 laps. Patient SaO2 decreased to 85% on room air at end 250 ft  Patient put on POC pulsed oxgyen and titrated up to 4L to get SaO2 to 94%. Patient able to walk another 500 on 4L POC pulsed oxygen without difficulty. SaO2 at end was 91%   Advised: Make sure you check your oxygen saturation  AT  your highest level of activity (not after you stop)   to be sure it stays over 90% and adjust  02 flow upward to maintain this level if needed but remember to turn it back to previous settings when you stop (to conserve your supply).   >>> referred for best fit for amb 02 and strongly encouraged pulmonary rehab  Each maintenance medication was reviewed in detail including emphasizing most importantly the difference between maintenance and prns and under what circumstances the prns are to be triggered using an action plan format where appropriate.  Total time for H and P, chart review, counseling, reviewing hfa/neb/02 /pulse ox  device(s) , directly observing portions of ambulatory 02 saturation study/ and generating customized AVS unique to this office visit / same day charting = 74m

## 2023-07-16 ENCOUNTER — Inpatient Hospital Stay: Payer: Medicare Other

## 2023-07-16 ENCOUNTER — Inpatient Hospital Stay: Payer: Medicare Other | Admitting: Hematology and Oncology

## 2023-07-19 NOTE — Telephone Encounter (Signed)
Patient called requesting pulmonary rehab.  Pulmonary rehab order placed.  Patient aware. Nothing further at this time.   Per LOV with Dr. Sherene Sires- 1/21/2 Treadmill build up to 30 minutes  3 x weekly or call for referral to pulmonary rehab.   Goal is to keep 02 level above 89%    Please schedule a follow up visit in 3 months but call sooner if needed     5

## 2023-07-21 ENCOUNTER — Telehealth (HOSPITAL_COMMUNITY): Payer: Self-pay

## 2023-07-21 NOTE — Telephone Encounter (Signed)
Pt insurance is active and benefits verified through [facility]. Co-pay $20.00, DED $0.00/$0.00 met, out of pocket $3,150.00/$7.04 met, co-insurance 0%. No pre-authorization required. Yana G./BC[employer] Medicare, 07/21/23 @ 3:05PM, QMV#[id number]

## 2023-07-21 NOTE — Telephone Encounter (Signed)
Called patient to see if she was interested in participating in the Pulmonary Rehab Program. Patient stated yes. Patient will come in for orientation on 07/26/23 @ 1030AM and will attend the 115PM exercise class.   Pensions consultant.

## 2023-07-23 ENCOUNTER — Other Ambulatory Visit: Payer: Self-pay

## 2023-07-23 ENCOUNTER — Telehealth: Payer: Self-pay | Admitting: Internal Medicine

## 2023-07-23 DIAGNOSIS — J449 Chronic obstructive pulmonary disease, unspecified: Secondary | ICD-10-CM

## 2023-07-23 DIAGNOSIS — J9611 Chronic respiratory failure with hypoxia: Secondary | ICD-10-CM

## 2023-07-23 NOTE — Telephone Encounter (Signed)
Placed new for home conentrator and portable O2 sent order to [facility] .

## 2023-07-23 NOTE — Telephone Encounter (Signed)
Placed order per Dr Sherene Sires for home concentrator and portable O2 for patient to be faxed to [facility]

## 2023-07-23 NOTE — Telephone Encounter (Signed)
Dr. Sherene Sires order new 02 start for the patient and for the 02 order to be sent to [facility]. Stacy with [facility] states the 02 order needs to be corrected to state home concentrator and portable. Tammy with [facility] states the POC don't go up to 4 liters.

## 2023-07-23 NOTE — Telephone Encounter (Signed)
You can write the order how they want it but under portable let them do best fit for portable system (does not have to be POC)

## 2023-07-26 ENCOUNTER — Encounter (HOSPITAL_COMMUNITY)
Admission: RE | Admit: 2023-07-26 | Discharge: 2023-07-26 | Disposition: A | Discharge status: Home / Self Care | Payer: Medicare Other | Source: Ambulatory Visit | Attending: Internal Medicine | Admitting: Internal Medicine

## 2023-07-26 ENCOUNTER — Encounter (HOSPITAL_COMMUNITY): Payer: Self-pay

## 2023-07-26 ENCOUNTER — Other Ambulatory Visit: Payer: Medicare Other

## 2023-07-26 ENCOUNTER — Ambulatory Visit: Payer: Medicare Other | Admitting: Hematology and Oncology

## 2023-07-26 VITALS — BP 140/78 | HR 75 | Wt 123.0 lb

## 2023-07-26 DIAGNOSIS — J449 Chronic obstructive pulmonary disease, unspecified: Secondary | ICD-10-CM | POA: Insufficient documentation

## 2023-07-26 DIAGNOSIS — Z1231 Encounter for screening mammogram for malignant neoplasm of breast: Secondary | ICD-10-CM | POA: Diagnosis not present

## 2023-07-26 DIAGNOSIS — J9611 Chronic respiratory failure with hypoxia: Secondary | ICD-10-CM | POA: Diagnosis not present

## 2023-07-26 NOTE — Progress Notes (Signed)
Dana Pruitt 83 y.o. female  Initial Psychosocial Assessment  Pt psychosocial assessment reveals pt lives with their spouse. Pt is currently retired. Pt hobbies include spending time with others and shopping. Pt reports her stress level is low. Areas of stress/anxiety include health and family .  Pt does exhibit signs of depression. Signs of depression include anxiety, helplessness, and hopelessness and difficulty maintaining sleep and fatigue. Pt shows good  coping skills with positive outlook . Offered emotional support and reassurance. Monitor and evaluate progress toward psychosocial goal(s).  Goal(s): Improved management of stress and depression Improved coping skills Help patient work toward returning to meaningful activities that improve patient's QOL and are attainable with patient's lung disease   07/26/2023 12:13 PM

## 2023-07-26 NOTE — Progress Notes (Signed)
Pulmonary Rehab Orientation Physical Assessment Note    Well appearing, A&Ox4, NAD Eyes/Ears: wears glasses  Lungs: Diminished with lower base wheezes, no rales, rhonchi. Stated she has chronic dry cough, dyspnea on exertion, stated Dr. Sherene Sires ordered her oxygen on 07/13/23 but hasn't received yet. Suppose to be wearing 4L [place] continuously Heart: Regular rate rhythm, no murmurs, no rubs, no clicks Gastrointestinal: abdomin soft, + bowel sounds in all 4 quads, denies recent weight gain or loss, endorses normal BMs Genitourinary: WNL, pt denies s/s Extremities:  +2 pulses, grip strength equal, strong, no edema, no cyanosis, no clubbing Integumentary: pt denies any rashes, open or non healing wounds Psy/Soc: PHQ 2&9 scores 4/10, endorses depression, she is currently on treatment and denies any needs at this time Assistive devices: glasses, recommended walker or cane, pt stated she has fallen 3x in past year

## 2023-07-26 NOTE — Progress Notes (Signed)
Dana Pruitt 83 y.o. female Pulmonary Rehab Orientation Note This patient who was referred to Pulmonary Rehab by Dr. Sherene Sires with the diagnosis of COPD 2 arrived today in Cardiac and Pulmonary Rehab. She  arrived ambulatory with normal gait. She  does not carry portable oxygen. [facility] is the provider for their DME. Per patient, Dana Pruitt uses oxygen never. Color good, skin warm and dry. Patient is oriented to time and place. Patient's medical history, psychosocial health, and medications reviewed. Psychosocial assessment reveals patient lives with spouse. Dana Pruitt is currently retired. Patient hobbies include spending time with others and shopping. Patient reports her stress level is low. Areas of stress/anxiety include health and family . Patient does exhibit signs of depression. Signs of depression include anxiety, helplessness, and hopelessness and difficulty maintaining sleep and fatigue. PHQ2/9 score 4/10. Dana Pruitt shows good  coping skills with positive outlook on life. Offered emotional support and reassurance. Will continue to monitor. Physical assessment performed by Nurse pick: Essie Hart RN. Please see their orientation physical assessment note. Quanita reports she does take medications as prescribed. Patient states she follows a regular  diet. The patient reports no specific efforts to gain or lose weight.. Patient's weight will be monitored closely. Demonstration and practice of PLB using pulse oximeter. [name] able to return demonstration satisfactorily. Safety and hand hygiene in the exercise area reviewed with patient. Dempsey voices understanding of the information reviewed. Department expectations discussed with patient and achievable goals were set. The patient shows enthusiasm about attending the program and we look forward to working with Dana Pruitt. Jasman completed a 6 min walk test today and is scheduled to begin exercise on 08/03/23 @1 :15.   1040-1220 Dana Pruitt, BSRT

## 2023-07-26 NOTE — Progress Notes (Signed)
Pulmonary Individual Treatment Plan  Patient Details  Name: Dana Pruitt MRN: [id number] Date of Birth: 03-15-1941 Referring Provider:   Doristine Devoid Pulmonary Rehab Walk Test from 07/26/2023 in [facility]  Referring Provider Dr. Sherene Sires       Initial Encounter Date:  Flowsheet Row Pulmonary Rehab Walk Test from 07/26/2023 in [facility]  Date 07/26/23       Visit Diagnosis: Stage 2 moderate COPD by GOLD classification (HCC)  Patient's Home Medications on Admission:   Current Outpatient Medications:    albuterol (PROAIR HFA) 108 (90 Base) MCG/ACT inhaler, 2 puffs every 4 hours as needed only  if your can't catch your breath, Disp: 1 each, Rfl: 6   atorvastatin (LIPITOR) 20 MG tablet, Take 20 mg by mouth daily. , Disp: , Rfl:    BREZTRI AEROSPHERE 160-9-4.8 MCG/ACT AERO, INHALE 2 PUFFS INTO THE LUNGS TWICE DAILY, Disp: 10.7 g, Rfl: 11   carvedilol (COREG) 6.25 MG tablet, TAKE 1 AND 1/2 TABLETS(9.375 MG) BY MOUTH TWICE DAILY WITH A MEAL, Disp: 45 tablet, Rfl: 1   denosumab (PROLIA) 60 MG/ML SOSY injection, Inject 60 mg into the skin every 6 (six) months., Disp: , Rfl:    diltiazem (CARDIZEM) 30 MG tablet, Take 1 tablet every 4 hours AS NEEDED for AFIB heart rate >100, Disp: 45 tablet, Rfl: 1   ELIQUIS 2.5 MG TABS tablet, TAKE 1 TABLET(2.5 MG) BY MOUTH TWICE DAILY, Disp: 180 tablet, Rfl: 1   ezetimibe (ZETIA) 10 MG tablet, Take 10 mg by mouth daily., Disp: , Rfl:    FEROSUL 325 (65 Fe) MG tablet, Take 325 mg by mouth every other day., Disp: , Rfl:    fluticasone (FLONASE) 50 MCG/ACT nasal spray, SHAKE LIQUID AND USE 2 SPRAYS IN EACH NOSTRIL DAILY, Disp: 16 g, Rfl: 3   guaiFENesin (MUCINEX) 600 MG 12 hr tablet, Take 1 tablet (600 mg total) by mouth 2 (two) times daily., Disp: 30 tablet, Rfl: 0   losartan (COZAAR) 25 MG tablet, Take 25 mg by mouth daily., Disp: , Rfl:     sertraline (ZOLOFT) 100 MG tablet, Take 100 mg by mouth daily., Disp: , Rfl:    nitrofurantoin (MACRODANTIN) 100 MG capsule, Take 100 mg by mouth at bedtime. (Patient not taking: Reported on 07/26/2023), Disp: , Rfl:  No current facility-administered medications for this encounter.  Facility-Administered Medications Ordered in Other Encounters:    sodium chloride flush (NS) 0.9 % injection 3 mL, 3 mL, Intravenous, Q12H, Laurey Morale, MD  Past Medical History: Past Medical History:  Diagnosis Date   Anemia    "several times over the years" (10/17/2012)   Anxiety    Breast cancer (HCC) 02/2010   right breast   CAD (coronary artery disease)    a. LHC 12/2018: mild non-obstructive CAD   Carcinoma of breast treated with adjuvant hormone therapy (HCC) 02/2010   Femara   Chronic systolic CHF    a. Echo in 2015: LVEF of 35-40%, b. 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     Labs: Review Flowsheet  More data exists      Latest Ref Rng & Units 01/31/2019 02/01/2019 02/02/2019 12/20/2021 12/21/2021  Labs for  ITP Cardiac and Pulmonary Rehab  Cholestrol 0 - 200 mg/dL - - - - 782   LDL (calc) 0 - 99 mg/dL - - - - 69   HDL-C >95 mg/dL - - - - 41   Trlycerides <150 mg/dL - - - - 73   Hemoglobin A1c 4.8 - 5.6 % - - - 5.9  -  PH, Arterial 7.350 - 7.450 7.386  7.429  7.415  7.317  7.310  7.356  - - -  PCO2 arterial 32.0 - 48.0 mmHg 36.2  32.7  42.5  41.0  43.5  36.7  - - -  Bicarbonate 20.0 - 28.0 mmol/L 22.0  21.6  27.2  21.1  22.0  20.7  - - -  TCO2 22 - 32 mmol/L 24  23  23  29  22  23  22   - 22  -  Acid-base deficit 0.0 - 2.0 mmol/L 3.0  2.0  5.0  4.0  5.0  - - -  O2 Saturation % 95.0  100.0  100.0  89.0  93.0  90.0  63.6  - -    Details       Multiple values from one day are sorted in reverse-chronological order         Capillary Blood Glucose: Lab Results  Component Value Date   GLUCAP 151 (H) 02/04/2019   GLUCAP 98 02/04/2019   GLUCAP 95 02/04/2019   GLUCAP 130 (H) 02/03/2019   GLUCAP 101 (H) 02/03/2019     Pulmonary Assessment Scores:  Pulmonary Assessment Scores     Row Name 07/26/23 1204         ADL UCSD   ADL Phase Entry     SOB Score total 52       CAT Score   CAT Score 24       mMRC Score   mMRC Score 2             UCSD: Self-administered rating of dyspnea associated with activities of daily living (ADLs) 6-point scale (0 = "not at all" to 5 = "maximal or unable to do because of breathlessness")  Scoring Scores range from 0 to 120.  Minimally important difference is 5 units  CAT: CAT can identify the health impairment of COPD patients and is better correlated with disease progression.  CAT has a scoring range of zero to 40. The CAT score is classified into four groups of low (less than 10), medium (10 - 20), high (21-30) and very high (31-40) based on the impact level of disease on health status. A CAT score over 10 suggests significant symptoms.  A worsening CAT score could be explained by an exacerbation, poor medication  adherence, poor inhaler  technique, or progression of COPD or comorbid conditions.  CAT MCID is 2 points  mMRC: mMRC (Modified Medical Research Council) Dyspnea Scale is used to assess the degree of baseline functional disability in patients of respiratory disease due to dyspnea. No minimal important difference is established. A decrease in score of 1 point or greater is considered a positive change.   Pulmonary Function Assessment:  Pulmonary Function Assessment - 07/26/23 1129       Breath   Bilateral Breath Sounds Rhonchi    Shortness of Breath Yes;Limiting activity             Exercise Target Goals: Exercise Program Goal: Individual exercise prescription set using results from initial 6 min walk test and THRR while considering  patient's activity barriers and safety.   Exercise Prescription Goal: Initial exercise prescription builds to 30-45 minutes a day of aerobic activity, 2-3 days per week.  Home exercise guidelines will be given to patient during program as part of exercise prescription that the participant will acknowledge.  Activity Barriers & Risk Stratification:  Activity Barriers & Cardiac Risk Stratification - 07/26/23 1127       Activity Barriers & Cardiac Risk Stratification   Activity Barriers Deconditioning;Muscular Weakness;Shortness of Breath;History of Falls    Cardiac Risk Stratification Moderate             6 Minute Walk:  6 Minute Walk     Row Name 07/26/23 1351         6 Minute Walk   Phase Initial     Distance 1080 feet     Walk Time 6 minutes     # of Rest Breaks 2     MPH 2.05     METS 2.41     RPE 14     Perceived Dyspnea  3     VO2 Peak 8.45     Symptoms No     Resting HR 75 bpm     Resting BP 140/78     Resting Oxygen Saturation  92 %     Exercise Oxygen Saturation  during 6 min walk 75 %     Max Ex. HR 89 bpm     Max Ex. BP 180/96     2 Minute Post BP 180/90       Interval HR   1 Minute HR 77     2 Minute HR 76     3 Minute HR 89     4  Minute HR 87     5 Minute HR 89     6 Minute HR 87     2 Minute Post HR 67     Interval Heart Rate? Yes       Interval Oxygen   Interval Oxygen? Yes     Baseline Oxygen Saturation % 92 %     1 Minute Oxygen Saturation % 75 %  stopped 1:10-1:26, placed on 2L     1 Minute Liters of Oxygen 0 L     2 Minute Oxygen Saturation % 90 %     2 Minute Liters of Oxygen 2 L     3 Minute Oxygen Saturation % 82 %  stopped 2:40-2:50, placed on 3L     3 Minute Liters of Oxygen 3 L     4 Minute Oxygen Saturation % 94 %     4 Minute Liters of Oxygen 3 L     5 Minute Oxygen Saturation % 92 %  5 Minute Liters of Oxygen 3 L     6 Minute Oxygen Saturation % 90 %     6 Minute Liters of Oxygen 3 L     2 Minute Post Oxygen Saturation % 100 %     2 Minute Post Liters of Oxygen 3 L              Oxygen Initial Assessment:  Oxygen Initial Assessment - 07/26/23 1128       Home Oxygen   Home Oxygen Device None    Sleep Oxygen Prescription None    Home Exercise Oxygen Prescription None    Home Resting Oxygen Prescription None      Initial 6 min Walk   Oxygen Used Continuous    Liters per minute 3      Program Oxygen Prescription   Program Oxygen Prescription Continuous    Liters per minute 3      Intervention   Short Term Goals To learn and exhibit compliance with exercise, home and travel O2 prescription;To learn and understand importance of monitoring SPO2 with pulse oximeter and demonstrate accurate use of the pulse oximeter.;To learn and understand importance of maintaining oxygen saturations>88%;To learn and demonstrate proper pursed lip breathing techniques or other breathing techniques. ;To learn and demonstrate proper use of respiratory medications    Long  Term Goals Exhibits compliance with exercise, home  and travel O2 prescription;Maintenance of O2 saturations>88%;Compliance with respiratory medication;Verbalizes importance of monitoring SPO2 with pulse oximeter and return  demonstration;Exhibits proper breathing techniques, such as pursed lip breathing or other method taught during program session;Demonstrates proper use of MDI's             Oxygen Re-Evaluation:   Oxygen Discharge (Final Oxygen Re-Evaluation):   Initial Exercise Prescription:  Initial Exercise Prescription - 07/26/23 1300       Date of Initial Exercise RX and Referring Provider   Date 07/26/23    Referring Provider Dr. Sherene Sires    Expected Discharge Date 10/21/23      Oxygen   Oxygen Continuous    Liters 3    Maintain Oxygen Saturation 88% or higher      NuStep   Level 1    SPM 75    Minutes 15    METs 2      Track   Laps 10    Minutes 15    METs 2.54      Prescription Details   Frequency (times per week) 2    Duration Progress to 30 minutes of continuous aerobic without signs/symptoms of physical distress      Intensity   THRR 40-80% of Max Heartrate 56-111    Ratings of Perceived Exertion 11-13    Perceived Dyspnea 0-4      Progression   Progression Continue to progress workloads to maintain intensity without signs/symptoms of physical distress.      Resistance Training   Training Prescription Yes    Weight red bands    Reps 10-15             Perform Capillary Blood Glucose checks as needed.  Exercise Prescription Changes:   Exercise Comments:   Exercise Goals and Review:   Exercise Goals     Row Name 07/26/23 1128             Exercise Goals   Increase Physical Activity Yes       Intervention Provide advice, education, support and counseling about physical activity/exercise needs.;Develop an individualized exercise prescription  for aerobic and resistive training based on initial evaluation findings, risk stratification, comorbidities and participant's personal goals.       Expected Outcomes Short Term: Attend rehab on a regular basis to increase amount of physical activity.;Long Term: Add in home exercise to make exercise part of routine  and to increase amount of physical activity.;Long Term: Exercising regularly at least 3-5 days a week.       Increase Strength and Stamina Yes       Intervention Provide advice, education, support and counseling about physical activity/exercise needs.;Develop an individualized exercise prescription for aerobic and resistive training based on initial evaluation findings, risk stratification, comorbidities and participant's personal goals.       Expected Outcomes Short Term: Increase workloads from initial exercise prescription for resistance, speed, and METs.;Short Term: Perform resistance training exercises routinely during rehab and add in resistance training at home;Long Term: Improve cardiorespiratory fitness, muscular endurance and strength as measured by increased METs and functional capacity ( )       Able to understand and use rate of perceived exertion (RPE) scale Yes       Intervention Provide education and explanation on how to use RPE scale       Expected Outcomes Short Term: Able to use RPE daily in rehab to express subjective intensity level;Long Term:  Able to use RPE to guide intensity level when exercising independently       Able to understand and use Dyspnea scale Yes       Intervention Provide education and explanation on how to use Dyspnea scale       Expected Outcomes Short Term: Able to use Dyspnea scale daily in rehab to express subjective sense of shortness of breath during exertion;Long Term: Able to use Dyspnea scale to guide intensity level when exercising independently       Knowledge and understanding of Target Heart Rate Range (THRR) Yes       Intervention Provide education and explanation of THRR including how the numbers were predicted and where they are located for reference       Expected Outcomes Short Term: Able to state/look up THRR;Long Term: Able to use THRR to govern intensity when exercising independently;Short Term: Able to use daily as guideline for intensity  in rehab       Understanding of Exercise Prescription Yes       Intervention Provide education, explanation, and written materials on patient's individual exercise prescription       Expected Outcomes Short Term: Able to explain program exercise prescription;Long Term: Able to explain home exercise prescription to exercise independently                Exercise Goals Re-Evaluation :   Discharge Exercise Prescription (Final Exercise Prescription Changes):   Nutrition:  Target Goals: Understanding of nutrition guidelines, daily intake of sodium 1500mg , cholesterol 200mg , calories 30% from fat and 7% or less from saturated fats, daily to have 5 or more servings of fruits and vegetables.  Biometrics:    Nutrition Therapy Plan and Nutrition Goals:   Nutrition Assessments:  MEDIFICTS Score Key: >=70 Need to make dietary changes  40-70 Heart Healthy Diet <= 40 Therapeutic Level Cholesterol Diet   Picture Your Plate Scores: <16 Unhealthy dietary pattern with much room for improvement. 41-50 Dietary pattern unlikely to meet recommendations for good health and room for improvement. 51-60 More healthful dietary pattern, with some room for improvement.  >60 Healthy dietary pattern, although there may be some specific behaviors  that could be improved.    Nutrition Goals Re-Evaluation:   Nutrition Goals Discharge (Final Nutrition Goals Re-Evaluation):   Psychosocial: Target Goals: Acknowledge presence or absence of significant depression and/or stress, maximize coping skills, provide positive support system. Participant is able to verbalize types and ability to use techniques and skills needed for reducing stress and depression.  Initial Review & Psychosocial Screening:  Initial Psych Review & Screening - 07/26/23 1123       Initial Review   Current issues with Current Depression;Current Psychotropic Meds;Current Stress Concerns    Source of Stress Concerns Chronic  Illness;Unable to participate in former interests or hobbies    Comments Dana Pruitt states she feels depressed some days. She is also dealing with the stress of taking care of her husband and dealing with her own health conditions.      Family Dynamics   Good Support System? Yes      Barriers   Psychosocial barriers to participate in program Psychosocial barriers identified (see note);The patient should benefit from training in stress management and relaxation.      Screening Interventions   Interventions Encouraged to exercise;To provide support and resources with identified psychosocial needs    Expected Outcomes Short Term goal: Identification and review with participant of any Quality of Life or Depression concerns found by scoring the questionnaire.;Long Term goal: The participant improves quality of Life and PHQ9 Scores as seen by post scores and/or verbalization of changes;Short Term goal: Utilizing psychosocial counselor, staff and physician to assist with identification of specific Stressors or current issues interfering with healing process. Setting desired goal for each stressor or current issue identified.;Long Term Goal: Stressors or current issues are controlled or eliminated.             Quality of Life Scores:  Scores of 19 and below usually indicate a poorer quality of life in these areas.  A difference of  2-3 points is a clinically meaningful difference.  A difference of 2-3 points in the total score of the Quality of Life Index has been associated with significant improvement in overall quality of life, self-image, physical symptoms, and general health in studies assessing change in quality of life.  PHQ-9: Review Flowsheet       07/26/2023 05/24/2019 04/03/2019  Depression screen PHQ 2/9  Decreased Interest 2 0 0  Down, Depressed, Hopeless 2 0 0  PHQ - 2 Score 4 0 0  Altered sleeping 2 - -  Tired, decreased energy 3 - -  Change in appetite 1 - -  Feeling bad or  failure about yourself  0 - -  Trouble concentrating 0 - -  Moving slowly or fidgety/restless 0 - -  Suicidal thoughts 0 - -  PHQ-9 Score 10 - -  Difficult doing work/chores Somewhat difficult - -   Interpretation of Total Score  Total Score Depression Severity:  1-4 = Minimal depression, 5-9 = Mild depression, 10-14 = Moderate depression, 15-19 = Moderately severe depression, 20-27 = Severe depression   Psychosocial Evaluation and Intervention:  Psychosocial Evaluation - 07/26/23 1127       Psychosocial Evaluation & Interventions   Interventions Encouraged to exercise with the program and follow exercise prescription;Stress management education    Comments Dana Pruitt admits to feeling depressed some days. She is also under alot of stress due to her lung condition and having to need oxygen. 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 She does have a strong  support system of family and friends. She is compliant with taking her psychotropic meds and denies needing a referral to a mental health specialist at this time.    Expected Outcomes For Dana Pruitt to participate in PR with less depression and stress    Continue Psychosocial Services  Follow up required by staff             Psychosocial Re-Evaluation:   Psychosocial Discharge (Final Psychosocial Re-Evaluation):   Education: Education Goals: Education classes will be provided on a weekly basis, covering required topics. Participant will state understanding/return demonstration of topics presented.  Learning Barriers/Preferences:  Learning Barriers/Preferences - 07/26/23 1127       Learning Barriers/Preferences   Learning Barriers None    Learning Preferences Skilled Demonstration             Education Topics: Know Your Numbers Group instruction that is supported by a PowerPoint presentation. Instructor discusses importance of knowing and understanding resting, exercise, and post-exercise  oxygen saturation, heart rate, and blood pressure. Oxygen saturation, heart rate, blood pressure, rating of perceived exertion, and dyspnea are reviewed along with a normal range for these values.    Exercise for the Pulmonary Patient Group instruction that is supported by a PowerPoint presentation. Instructor discusses benefits of exercise, core components of exercise, frequency, duration, and intensity of an exercise routine, importance of utilizing pulse oximetry during exercise, safety while exercising, and options of places to exercise outside of rehab.    MET Level  Group instruction provided by PowerPoint, verbal discussion, and written material to support subject matter. Instructor reviews what METs are and how to increase METs.    Pulmonary Medications Verbally interactive group education provided by instructor with focus on inhaled medications and proper administration.   Anatomy and Physiology of the Respiratory System Group instruction provided by PowerPoint, verbal discussion, and written material to support subject matter. Instructor reviews respiratory cycle and anatomical components of the respiratory system and their functions. Instructor also reviews differences in obstructive and restrictive respiratory diseases with examples of each.    Oxygen Safety Group instruction provided by PowerPoint, verbal discussion, and written material to support subject matter. There is an overview of "What is Oxygen" and "Why do we need it".  Instructor also reviews how to create a safe environment for oxygen use, the importance of using oxygen as prescribed, and the risks of noncompliance. There is a brief discussion on traveling with oxygen and resources the patient may utilize.   Oxygen Use Group instruction provided by PowerPoint, verbal discussion, and written material to discuss how supplemental oxygen is prescribed and different types of oxygen supply systems. Resources for more  information are provided.    Breathing Techniques Group instruction that is supported by demonstration and informational handouts. Instructor discusses the benefits of pursed lip and diaphragmatic breathing and detailed demonstration on how to perform both.     Risk Factor Reduction Group instruction that is supported by a PowerPoint presentation. Instructor discusses the definition of a risk factor, different risk factors for pulmonary disease, and how the heart and lungs work together.   Pulmonary Diseases Group instruction provided by PowerPoint, verbal discussion, and written material to support subject matter. Instructor gives an overview of the different type of pulmonary diseases. There is also a discussion on risk factors and symptoms as well as ways to manage the diseases.   Stress and Energy Conservation Group instruction provided by PowerPoint, verbal discussion, and written material to support subject matter. Instructor gives an  overview of stress and the impact it can have on the body. Instructor also reviews ways to reduce stress. There is also a discussion on energy conservation and ways to conserve energy throughout the day.   Warning Signs and Symptoms Group instruction provided by PowerPoint, verbal discussion, and written material to support subject matter. Instructor reviews warning signs and symptoms of stroke, heart attack, cold and flu. Instructor also reviews ways to prevent the spread of infection.   Other Education Group or individual verbal, written, or video instructions that support the educational goals of the pulmonary rehab program.    Knowledge Questionnaire Score:  Knowledge Questionnaire Score - 07/26/23 1204       Knowledge Questionnaire Score   Pre Score 14/18             Core Components/Risk Factors/Patient Goals at Admission:  Personal Goals and Risk Factors at Admission - 07/26/23 1127       Core Components/Risk Factors/Patient Goals  on Admission   Improve shortness of breath with ADL's Yes    Intervention Provide education, individualized exercise plan and daily activity instruction to help decrease symptoms of SOB with activities of daily living.    Expected Outcomes Short Term: Improve cardiorespiratory fitness to achieve a reduction of symptoms when performing ADLs;Long Term: Be able to perform more ADLs without symptoms or delay the onset of symptoms    Increase knowledge of respiratory medications and ability to use respiratory devices properly  Yes    Intervention Provide education and demonstration as needed of appropriate use of medications, inhalers, and oxygen therapy.    Expected Outcomes Short Term: Achieves understanding of medications use. Understands that oxygen is a medication prescribed by physician. Demonstrates appropriate use of inhaler and oxygen therapy.;Long Term: Maintain appropriate use of medications, inhalers, and oxygen therapy.    Stress Yes    Intervention Offer individual and/or small group education and counseling on adjustment to heart disease, stress management and health-related lifestyle change. Teach and support self-help strategies.;Refer participants experiencing significant psychosocial distress to appropriate mental health specialists for further evaluation and treatment. When possible, include family members and significant others in education/counseling sessions.    Expected Outcomes Short Term: Participant demonstrates changes in health-related behavior, relaxation and other stress management skills, ability to obtain effective social support, and compliance with psychotropic medications if prescribed.;Long Term: Emotional wellbeing is indicated by absence of clinically significant psychosocial distress or social isolation.             Core Components/Risk Factors/Patient Goals Review:    Core Components/Risk Factors/Patient Goals at Discharge (Final Review):    ITP  Comments:   Comments: Dr. Mechele Collin is Medical Director for Pulmonary Rehab at [facility].

## 2023-07-28 ENCOUNTER — Inpatient Hospital Stay: Payer: Medicare Other | Admitting: Hematology and Oncology

## 2023-07-28 ENCOUNTER — Inpatient Hospital Stay: Payer: Medicare Other | Attending: Internal Medicine

## 2023-07-28 VITALS — BP 135/90 | HR 71 | Temp 97.9°F | Resp 17 | Wt 121.5 lb

## 2023-07-28 DIAGNOSIS — J449 Chronic obstructive pulmonary disease, unspecified: Secondary | ICD-10-CM | POA: Diagnosis not present

## 2023-07-28 DIAGNOSIS — Z853 Personal history of malignant neoplasm of breast: Secondary | ICD-10-CM | POA: Insufficient documentation

## 2023-07-28 DIAGNOSIS — D509 Iron deficiency anemia, unspecified: Secondary | ICD-10-CM

## 2023-07-28 DIAGNOSIS — Z08 Encounter for follow-up examination after completed treatment for malignant neoplasm: Secondary | ICD-10-CM | POA: Diagnosis not present

## 2023-07-28 LAB — CBC WITH DIFFERENTIAL/PLATELET
Abs Immature Granulocytes: 0.02 10*3/uL (ref 0.00–0.07)
Basophils Absolute: 0.1 10*3/uL (ref 0.0–0.1)
Basophils Relative: 1 %
Eosinophils Absolute: 0.4 10*3/uL (ref 0.0–0.5)
Eosinophils Relative: 7 %
HCT: 37.8 % (ref 36.0–46.0)
Hemoglobin: 12.5 g/dL (ref 12.0–15.0)
Immature Granulocytes: 0 %
Lymphocytes Relative: 19 %
Lymphs Abs: 0.9 10*3/uL (ref 0.7–4.0)
MCH: 32.9 pg (ref 26.0–34.0)
MCHC: 33.1 g/dL (ref 30.0–36.0)
MCV: 99.5 fL (ref 80.0–100.0)
Monocytes Absolute: 0.4 10*3/uL (ref 0.1–1.0)
Monocytes Relative: 9 %
Neutro Abs: 3.1 10*3/uL (ref 1.7–7.7)
Neutrophils Relative %: 64 %
Platelets: 178 10*3/uL (ref 150–400)
RBC: 3.8 MIL/uL — ABNORMAL LOW (ref 3.87–5.11)
RDW: 15.1 % (ref 11.5–15.5)
WBC: 4.8 10*3/uL (ref 4.0–10.5)
nRBC: 0.4 % — ABNORMAL HIGH (ref 0.0–0.2)

## 2023-07-28 NOTE — Progress Notes (Signed)
 [facility] CONSULT NOTE  Patient Care Team: Clarice Nottingham, MD as PCP - General Court Dorn PARAS, MD as PCP - Cardiology (Cardiology)  CHIEF COMPLAINTS/PURPOSE OF CONSULTATION:  IDA  ASSESSMENT & PLAN:  This is a very pleasant 83 yr female pt with PMH significant for right-sided breast cancer, A-fib on anticoagulation, COPD, iron deficiency anemia referred to hematology for evaluation and recommendations regarding the anemia.  Chronic Obstructive Pulmonary Disease (COPD) Recently started on home oxygen  therapy. No current exacerbation noted on physical exam. Patient to start pulmonary clinic for further management. -Continue home oxygen  therapy as directed by pulmonary clinic. -Follow up with pulmonary clinic as scheduled.  Iron Deficiency Anemia Hemoglobin currently within normal range on iron supplementation. -Continue iron supplementation every other day and B 12 daily. -Check iron and B12 levels at next visit in 3 months.  Breast Cancer (History) No current complaints. Recent mammogram performed at [facility]. -Request mammogram report from [facility] for review.  General Health Maintenance -Continue B12 supplementation daily. -Return for follow-up and labs in 3 months. -Report any signs of gastrointestinal bleeding such as blood in stool or black stool.   HISTORY OF PRESENTING ILLNESS:  Dana Pruitt 83 y.o. female is here because of IDA  Discussed the use of AI scribe software for clinical note transcription with the patient, who gave verbal consent to proceed.  Dana Pruitt is an 83 year old female with COPD who presents with concerns about oxygen  therapy and pulmonary rehabilitation.  She is starting oxygen  therapy at home this week and is uncertain about the frequency of use. Initially advised to use it three times a week, she feels she needs it more often. After attending a meeting at the pulmonary clinic, she learned she might require more frequent use.  She has started using oxygen  at night but finds it irritating and removes it during the night. She notices a difference in her ability to manage stairs at home since starting oxygen  therapy.  She is currently taking iron pills every other day and B12 daily. Her hemoglobin level is now 12.5 grams, an improvement from previous levels around 11. She follows a diet that includes some meat, such as chicken, bacon, and hamburgers, but not frequently. She is aware of the importance of maintaining B12 levels, especially given her dietary habits.  She has a history of breast cancer diagnosed about ten years ago and continues to have regular mammograms, with the most recent one done last week at [facility]  All other systems were reviewed with the patient and are negative.  MEDICAL HISTORY:  Past Medical History:  Diagnosis Date   Anemia    several times over the years (10/17/2012)   Anxiety    Breast cancer (HCC) 02/2010   right breast   CAD (coronary artery disease)    a. LHC 12/2018: mild non-obstructive CAD   Carcinoma of breast treated with adjuvant hormone therapy (HCC) 02/2010   Femara    Chronic systolic CHF    a. Echo in 2015: LVEF of 35-40%, b. 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 SN: [id number];  Surgeon: Fleeta Ochoa, Maude, MD;  Location: [facility] OR;  Service: Open Heart Surgery;  Laterality: N/A;   APPENDECTOMY  1974   BREAST BIOPSY Right 02/27/10   Needle core Biopsy; Invasive Mammary; ER/PR Positive, Her-2 Neu negative, Ki-67 22%   BREAST LUMPECTOMY WITH SENTINEL LYMPH NODE BIOPSY Right 07/03/11   Invasive Ductal Carcinoma;0/3 nodes negative,; ER,PR Positive, Her-2 Neg; Ki-67 22%   CESAREAN SECTION  1970; 1974   COLONOSCOPY     DILATION AND CURETTAGE OF UTERUS  1970's   RIGHT/LEFT HEART CATH AND CORONARY ANGIOGRAPHY N/A 01/02/2019   Procedure: RIGHT/LEFT HEART CATH AND CORONARY ANGIOGRAPHY;  Surgeon: Rolan Ezra RAMAN, MD;  Location: [facility] INVASIVE CV LAB;  Service: Cardiovascular;  Laterality: N/A;   TEE WITHOUT CARDIOVERSION N/A 01/31/2019   Procedure: TRANSESOPHAGEAL ECHOCARDIOGRAM (TEE);  Surgeon: Fleeta Ochoa, Maude, MD;  [facility];  Service: Open Heart Surgery;  Laterality: N/A;   TUBAL LIGATION  1974   WISDOM TOOTH EXTRACTION      SOCIAL HISTORY: Social History   Socioeconomic History   Marital status: Married    Spouse name: Not on file   Number of children: 2   Years of education: college   Highest education level: Bachelor's degree (e.g., BA, AB, BS)  Occupational History    Employer: RETIRED  Tobacco Use   Smoking status: Former    Current packs/day: 0.00    Average packs/day: 3.0 packs/day for 32.1 years (96.3 ttl pk-yrs)    Types: Cigarettes    Start date: 05/19/1958    Quit date: 06/30/1990    Years since quitting: 33.0   Smokeless tobacco: Never   Tobacco comments:    Stopped smoking for 2-3 years during second child.   Vaping Use   Vaping status: Never Used  Substance and Sexual Activity   Alcohol use: Yes    Comment: 1-2 glasses wine/month   Drug use: No   Sexual activity: Yes    Birth control/protection: Post-menopausal    Comment: Menarche age 37, G12,P2, Menopause in 40s, No HRT Married  Other Topics Concern   Not  on file  Social History Narrative   [facility] (12/29/16):   Originally from [place], [place]. Moved to [place] at 83 y.o. Does have a dog. Hasn't worked outside the home. Remote parakeet exposure from her children. No mold or hot tub exposure. Enjoys doing yard work & walking.    Social Drivers of Corporate Investment Banker Strain: Not on file  Food Insecurity: Low Risk  (11/05/2022)   [facility]   Hunger Vital Sign    Worried About Running Out of Food in the Last Year: Never true    Ran Out of Food in the Last Year: Never true  Transportation Needs: No Transportation Needs (11/05/2022)   [facility]   Transportation    In the past 12 months, has lack of reliable transportation kept you from medical appointments, meetings, work or from getting things needed for daily living? : No  Physical Activity: Not on file  Stress: Not on file  Social Connections: Unknown (02/25/2023)   [facility]   Social Network    Social Network: Not on file  Intimate Partner Violence: Unknown (02/25/2023)   Received from [facility]   HITS    Physically Hurt: Not on file    Insult or Talk Down To: Not on file    Threaten Physical Harm: Not on file    Scream or Curse: Not on file    FAMILY HISTORY: Family History  Problem Relation Age of Onset   Heart disease Mother    Cancer Father    Heart disease Maternal Grandmother    Heart disease Maternal Grandfather    Cancer Paternal Grandmother    Lung disease Neg Hx     ALLERGIES:  has no known allergies.  MEDICATIONS:  Current Outpatient Medications  Medication Sig Dispense Refill   albuterol  (PROAIR  HFA) 108 (90 Base) MCG/ACT inhaler 2 puffs every 4 hours as needed only  if your can't catch your breath 1 each 6   atorvastatin  (LIPITOR) 20 MG tablet Take 20 mg by mouth daily.      BREZTRI  AEROSPHERE 160-9-4.8 MCG/ACT AERO INHALE 2 PUFFS INTO THE LUNGS TWICE DAILY 10.7 g 11    carvedilol  (COREG ) 6.25 MG tablet TAKE 1 AND 1/2 TABLETS(9.375 MG) BY MOUTH TWICE DAILY WITH A MEAL 45 tablet 1   denosumab  (PROLIA ) 60 MG/ML SOSY injection Inject 60 mg into the skin every 6 (six) months.     diltiazem  (CARDIZEM ) 30 MG tablet Take 1 tablet every 4 hours AS NEEDED for AFIB heart rate >100 45 tablet 1   ELIQUIS  2.5 MG TABS tablet TAKE 1 TABLET(2.5 MG) BY MOUTH TWICE DAILY 180 tablet 1   ezetimibe  (ZETIA ) 10 MG tablet Take 10 mg by mouth daily.     FEROSUL 325 (65 Fe) MG tablet Take 325 mg by mouth every other day.     fluticasone  (FLONASE ) 50 MCG/ACT nasal spray SHAKE LIQUID AND USE 2 SPRAYS IN EACH NOSTRIL DAILY 16 g 3   guaiFENesin  (MUCINEX ) 600 MG 12 hr tablet Take 1 tablet (600 mg total) by mouth 2 (two) times daily. 30 tablet 0   losartan  (COZAAR ) 25 MG tablet Take 25 mg by mouth daily.     nitrofurantoin (MACRODANTIN) 100 MG capsule Take 100 mg by mouth at bedtime. (Patient not taking: Reported on 07/26/2023)     sertraline  (ZOLOFT ) 100 MG tablet Take 100 mg by mouth daily.     No current facility-administered medications for this visit.   Facility-Administered Medications Ordered in Other Visits  Medication Dose Route Frequency Provider Last Rate Last Admin   sodium chloride  flush (NS) 0.9 % injection 3 mL  3 mL Intravenous Q12H Rolan Ezra RAMAN, MD         PHYSICAL EXAMINATION: ECOG PERFORMANCE STATUS: 1 - Symptomatic but completely ambulatory  Vitals:   07/28/23 1429  BP: (!) 135/90  Pulse: 71  Resp: 17  Temp: 97.9 F (36.6 C)  SpO2: 94%   Filed Weights   07/28/23 1429  Weight: 121 lb 8 oz (55.1 kg)    GENERAL:alert, no distress and comfortable Chest: CTA bilaterally. No adv sounds. Heart: RRR Abdomen: soft, non tender, non distended, no organomegaly No LE edema.  LABORATORY DATA:  I have reviewed the data as listed Lab Results  Component Value Date   WBC 4.8 07/28/2023   HGB 12.5 07/28/2023   HCT 37.8 07/28/2023   MCV 99.5 07/28/2023   PLT  178 07/28/2023  Chemistry      Component Value Date/Time   NA 140 03/23/2023 1117   NA 139 12/11/2021 1003   NA 138 11/27/2014 1017   K 4.5 03/23/2023 1117   K 4.9 11/27/2014 1017   CL 107 03/23/2023 1117   CL 105 03/31/2012 1410   CO2 28 03/23/2023 1117   CO2 25 11/27/2014 1017   BUN 18 03/23/2023 1117   BUN 15 12/11/2021 1003   BUN 18.7 11/27/2014 1017   CREATININE 1.19 (H) 03/23/2023 1117   CREATININE 1.0 11/27/2014 1017      Component Value Date/Time   CALCIUM  10.5 (H) 03/23/2023 1117   CALCIUM  10.1 11/27/2014 1017   ALKPHOS 35 (L) 03/23/2023 1117   ALKPHOS 35 (L) 11/27/2014 1017   AST 16 03/23/2023 1117   AST 19 11/27/2014 1017   ALT 12 03/23/2023 1117   ALT 12 11/27/2014 1017   BILITOT 0.6 03/23/2023 1117   BILITOT 0.45 11/27/2014 1017       RADIOGRAPHIC STUDIES: I have personally reviewed the radiological images as listed and agreed with the findings in the report. No results found.  All questions were answered. The patient knows to call the clinic with any problems, questions or concerns. I spent 30 minutes in the care of this patient including H and P, review of records, counseling and coordination of care.     Amber Stalls, MD 07/28/2023 2:35 PM

## 2023-07-29 DIAGNOSIS — E538 Deficiency of other specified B group vitamins: Secondary | ICD-10-CM | POA: Diagnosis not present

## 2023-07-29 DIAGNOSIS — R0902 Hypoxemia: Secondary | ICD-10-CM | POA: Diagnosis not present

## 2023-07-29 DIAGNOSIS — I10 Essential (primary) hypertension: Secondary | ICD-10-CM | POA: Diagnosis not present

## 2023-08-03 ENCOUNTER — Ambulatory Visit: Payer: Medicare Other | Attending: Cardiovascular Disease | Admitting: Cardiovascular Disease

## 2023-08-03 ENCOUNTER — Encounter: Payer: Self-pay | Admitting: Cardiovascular Disease

## 2023-08-03 ENCOUNTER — Encounter (HOSPITAL_COMMUNITY): Admission: RE | Admit: 2023-08-03 | Payer: Medicare Other | Source: Ambulatory Visit

## 2023-08-03 ENCOUNTER — Telehealth (HOSPITAL_COMMUNITY): Payer: Self-pay

## 2023-08-03 ENCOUNTER — Telehealth: Payer: Self-pay | Admitting: Internal Medicine

## 2023-08-03 VITALS — BP 120/66 | HR 68 | Ht 64.0 in | Wt 121.6 lb

## 2023-08-03 DIAGNOSIS — I7121 Aneurysm of the ascending aorta, without rupture: Secondary | ICD-10-CM

## 2023-08-03 DIAGNOSIS — I2510 Atherosclerotic heart disease of native coronary artery without angina pectoris: Secondary | ICD-10-CM

## 2023-08-03 DIAGNOSIS — Z952 Presence of prosthetic heart valve: Secondary | ICD-10-CM

## 2023-08-03 DIAGNOSIS — I351 Nonrheumatic aortic (valve) insufficiency: Secondary | ICD-10-CM

## 2023-08-03 DIAGNOSIS — I10 Essential (primary) hypertension: Secondary | ICD-10-CM

## 2023-08-03 DIAGNOSIS — I428 Other cardiomyopathies: Secondary | ICD-10-CM

## 2023-08-03 DIAGNOSIS — J449 Chronic obstructive pulmonary disease, unspecified: Secondary | ICD-10-CM

## 2023-08-03 DIAGNOSIS — E785 Hyperlipidemia, unspecified: Secondary | ICD-10-CM

## 2023-08-03 DIAGNOSIS — I5022 Chronic systolic (congestive) heart failure: Secondary | ICD-10-CM | POA: Diagnosis not present

## 2023-08-03 DIAGNOSIS — I480 Paroxysmal atrial fibrillation: Secondary | ICD-10-CM

## 2023-08-03 NOTE — Patient Instructions (Signed)
Medication Instructions:  Your physician recommends that you continue on your current medications as directed. Please refer to the Current Medication list given to you today.  *If you need a refill on your cardiac medications before your next appointment, please call your pharmacy*   Testing/Procedures: Your physician has requested that you have an echocardiogram. Echocardiography is a painless test that uses sound waves to create images of your heart. It provides your doctor with information about the size and shape of your heart and how well your heart's chambers and valves are working. This procedure takes approximately one hour. There are no restrictions for this procedure. Please do NOT wear cologne, perfume, aftershave, or lotions (deodorant is allowed). Please arrive 15 minutes prior to your appointment time.  Please note: We ask at that you not bring children with you during ultrasound (echo/ vascular) testing. Due to room size and safety concerns, children are not allowed in the ultrasound rooms during exams. Our front office staff cannot provide observation of children in our lobby area while testing is being conducted. An adult accompanying a patient to their appointment will only be allowed in the ultrasound room at the discretion of the ultrasound technician under special circumstances. We apologize for any inconvenience.    Follow-Up: At [facility], you and your health needs are our priority.  As part of our continuing mission to provide you with exceptional heart care, we have created designated Provider Care Teams.  These Care Teams include your primary Cardiologist (physician) and Advanced Practice Providers (APPs -  Physician Assistants and Nurse Practitioners) who all work together to provide you with the care you need, when you need it.  We recommend signing up for the patient portal called "MyChart".  Sign up information is provided on this After Visit Summary.   MyChart is used to connect with patients for Virtual Visits (Telemedicine).  Patients are able to view lab/test results, encounter notes, upcoming appointments, etc.  Non-urgent messages can be sent to your provider as well.   To learn more about what you can do with MyChart, go to [email or web address].    Your next appointment:   6 month(s)  Provider:   Nanetta Batty, MD    Other Instructions

## 2023-08-03 NOTE — Telephone Encounter (Signed)
RN called and spoke to pt. Pt stated she had an appointment with Dr. Allyson Sabal, cardiology, spoke about changing pulmonologist, and advised to hold off on pulm rehab until she sees a new pulmonologist.

## 2023-08-03 NOTE — Assessment & Plan Note (Signed)
History of severe aortic insufficiency status post bioprosthetic aortic valve replacement by Dr. Maren Beach 01/31/2019 with a 21 mm Inspira Edwards valve.  Her EF improved ultimately from 40 to 45% preop up to 50 to 60 to 65% on 12/21/2021.  She has had increasing shortness of breath over the last 6 months for unclear reasons.  I am going to recheck a 2D echocardiogram.

## 2023-08-03 NOTE — Assessment & Plan Note (Addendum)
History of hyperlipidemia on statin therapy and Zetia with lipid profile performed 02/05/2023 revealing total cholesterol 133, LDL 59 HDL 51.

## 2023-08-03 NOTE — Assessment & Plan Note (Signed)
History of PAF maintaining sinus rhythm on Eliquis oral anticoagulation.

## 2023-08-03 NOTE — Assessment & Plan Note (Signed)
Cardiac catheterization performed by Dr. Shirlee Latch 01/02/2019 revealed minimal nonobstructive CAD.

## 2023-08-03 NOTE — Assessment & Plan Note (Signed)
History of essential hypertension with blood pressure measured today at 120/66.  She is on diltiazem and losartan.

## 2023-08-03 NOTE — Assessment & Plan Note (Signed)
History is of a small ascending thoracic aortic aneurysm measuring 43 mm by 2D echocardiogram.  We will recheck this.

## 2023-08-03 NOTE — Progress Notes (Signed)
08/03/2023 Dana Pruitt   Jan 07, 1941  [id number]  Primary Physician Merri Brunette, MD Primary Cardiologist: Runell Gess MD Nicholes Calamity, [place]  HPI:  Dana Pruitt is a 83 y.o.  thin-appearing married Caucasian female mother of 2, grandmother of 2 grandchildren whose mother Lujean Amel was also a patient of mine and died at the age of 38. She was referred by Dr. Merri Brunette for cardiovascular evaluation because of echocardiogram performed 02/16/14 that showed moderate left ventricular dysfunction with an ejection fraction of 35-40% and moderate aortic insufficiency.  I last saw her in the office 08/26/2021.  Her cardiovascular risk factor profile is remarkable for remote tobacco abuse having quit in 1990 and treated hyperlipidemia. There is no family history of heart disease. She has never had a heart attack or stroke and denies chest pain or shortness of breath. She did have a Hollenhorst plaque documented by ophthalmologic exam several months ago. Carotid Dopplers performed since that time were normal. The plaque is since resolved. A 2-D echo was performed that showed an EF of 35-40% with moderate aortic insufficiency. LV size is normal and there was no LVH. She did have breast cancer documented by lumpectomy in 2013 and had chemotherapy and radiation therapy. Her oncologist was Dr. Nelly Rout . She is on Femara . I began her on low-dose carvedilol and repeated her 2-D echocardiogram on 08/10/14 revealed improvement in her LV function up to 45-50%. She continues to have moderate to severe AI with normal LV size and function.. She only complains of mild dyspnea on exertion. Since I saw her year ago she should make remained clinically stable. She denies chest pain or shortness of breath   2D echo performed 10/04/2018 revealed EF of 40 to 45% with normal LV size with severe aortic insufficiency.  She continues to deny symptoms of shortness of breath.   She was admitted in July with  chest pain and shortness of breath and underwent right left heart cath by Dr. Shirlee Latch revealing minimal CAD with severe AI.  She ultimately underwent bioprosthetic AVR by Dr. Maren Beach and a follow-up admission 01/31/2019 with a 21 mm Inspira's Edwards bioprosthetic valve.  She was hospitalized for 10 days.  She continues to recuperate.  She denies chest pain or shortness of breath.  She remains on Coumadin anticoagulation because of preop A. fib which converted prior to discharge on amiodarone which has since been discontinued.  She is participating cardiac rehab.  She was on 1 L of O2 with exercise and at night which she was not on prior to admission I think can probably be discontinued.   She  was seen in the emergency room for PAF which converted spontaneously several hours later.  She had a recurrent episode after that.  She saw Rudi Coco in the A. fib clinic 03/13/2020.  She was started on Eliquis oral anticoagulation and her carvedilol was down titrated because of dizziness.  Recent 2D echo performed 10/11/2019 revealed normal LV systolic function with a well-functioning aortic bioprosthesis.  Since I saw her in the office 2 months ago she apparently has been diagnosed by Dr. Sherene Sires  with COPD.  She began noticing increasing dyspnea on exertion 6 months ago.  She stopped smoking in 1990.  She denies chest pain.   Current Meds  Medication Sig   albuterol (PROAIR HFA) 108 (90 Base) MCG/ACT inhaler 2 puffs every 4 hours as needed only  if your can't catch your breath   atorvastatin (  LIPITOR) 20 MG tablet Take 20 mg by mouth daily.    BREZTRI AEROSPHERE 160-9-4.8 MCG/ACT AERO INHALE 2 PUFFS INTO THE LUNGS TWICE DAILY   carvedilol (COREG) 6.25 MG tablet TAKE 1 AND 1/2 TABLETS(9.375 MG) BY MOUTH TWICE DAILY WITH A MEAL   Cholecalciferol (VITAMIN D3) 25 MCG (1000 UT) CAPS Take 1,000 Units by mouth 2 (two) times daily.  2 capsules a day for 2 weeks and then 1 daily Orally Once a day for 30 days   denosumab  (PROLIA) 60 MG/ML SOSY injection Inject 60 mg into the skin every 6 (six) months.   diltiazem (CARDIZEM) 30 MG tablet Take 1 tablet every 4 hours AS NEEDED for AFIB heart rate >100   ELIQUIS 2.5 MG TABS tablet TAKE 1 TABLET(2.5 MG) BY MOUTH TWICE DAILY   ezetimibe (ZETIA) 10 MG tablet Take 10 mg by mouth daily.   FEROSUL 325 (65 Fe) MG tablet Take 325 mg by mouth every other day.   fluticasone (FLONASE) 50 MCG/ACT nasal spray SHAKE LIQUID AND USE 2 SPRAYS IN EACH NOSTRIL DAILY   guaiFENesin (MUCINEX) 600 MG 12 hr tablet Take 1 tablet (600 mg total) by mouth 2 (two) times daily.   losartan (COZAAR) 25 MG tablet Take 25 mg by mouth daily.   nitrofurantoin (MACRODANTIN) 100 MG capsule Take 100 mg by mouth at bedtime.   sertraline (ZOLOFT) 100 MG tablet Take 100 mg by mouth 2 (two) times daily.   valACYclovir (VALTREX) 1000 MG tablet Take 500 mg by mouth as needed.     No Known Allergies  Social History   Socioeconomic History   Marital status: Married    Spouse name: Not on file   Number of children: 2   Years of education: college   Highest education level: Bachelor's degree (e.g., BA, AB, BS)  Occupational History    Employer: RETIRED  Tobacco Use   Smoking status: Former    Current packs/day: 0.00    Average packs/day: 3.0 packs/day for 32.1 years (96.3 ttl pk-yrs)    Types: Cigarettes    Start date: 05/19/1958    Quit date: 06/30/1990    Years since quitting: 33.1   Smokeless tobacco: Never   Tobacco comments:    Stopped smoking for 2-3 years during second child.   Vaping Use   Vaping status: Never Used  Substance and Sexual Activity   Alcohol use: Yes    Comment: 1-2 glasses wine/month   Drug use: No   Sexual activity: Yes    Birth control/protection: Post-menopausal    Comment: Menarche age 24, G67,P2, Menopause in 63"s, No HRT Married  Other Topics Concern   Not on file  Social History Narrative   [facility] (12/29/16):   Originally from [place], [place]. Moved  to [place] at 83 y.o. Does have a dog. Hasn't worked outside the home. Remote parakeet exposure from her children. No mold or hot tub exposure. Enjoys doing yard work & walking.    Social Drivers of Corporate investment banker Strain: Not on file  Food Insecurity: Low Risk  (11/05/2022)   [facility]   Hunger Vital Sign    Worried About Running Out of Food in the Last Year: Never true    Ran Out of Food in the Last Year: Never true  Transportation Needs: No Transportation Needs (11/05/2022)   [facility]   Transportation    In the past 12 months, has lack of reliable  transportation kept you from medical appointments, meetings, work or from getting things needed for daily living? : No  Physical Activity: Not on file  Stress: Not on file  Social Connections: Unknown (02/25/2023)   [facility] from [facility]   Social Network    Social Network: Not on file  Intimate Partner Violence: Unknown (02/25/2023)   Received from [facility]   HITS    Physically Hurt: Not on file    Insult or Talk Down To: Not on file    Threaten Physical Harm: Not on file    Scream or Curse: Not on file     Review of Systems: General: negative for chills, fever, night sweats or weight changes.  Cardiovascular: negative for chest pain, dyspnea on exertion, edema, orthopnea, palpitations, paroxysmal nocturnal dyspnea or shortness of breath Dermatological: negative for rash Respiratory: negative for cough or wheezing Urologic: negative for hematuria Abdominal: negative for nausea, vomiting, diarrhea, bright red blood per rectum, melena, or hematemesis Neurologic: negative for visual changes, syncope, or dizziness All other systems reviewed and are otherwise negative except as noted above.    Blood pressure 120/66, pulse 68, height 5\' 4"  (1.626 m), weight 121 lb 9.6 oz (55.2 kg), SpO2 90%.  General appearance: alert and no distress Neck: no adenopathy, no  carotid bruit, no JVD, supple, symmetrical, trachea midline, and thyroid not enlarged, symmetric, no tenderness/mass/nodules Lungs: clear to auscultation bilaterally Heart: regular rate and rhythm, S1, S2 normal, no murmur, click, rub or gallop Extremities: extremities normal, atraumatic, no cyanosis or edema Pulses: 2+ and symmetric Skin: Skin color, texture, turgor normal. No rashes or lesions Neurologic: Grossly normal  EKG EKG Interpretation Date/Time:  Tuesday August 03 2023 12:06:53 EST Ventricular Rate:  68 PR Interval:  174 QRS Duration:  140 QT Interval:  438 QTC Calculation: 465 R Axis:   205  Text Interpretation: Normal sinus rhythm Right superior axis deviation Left ventricular hypertrophy with QRS widening and repolarization abnormality ( Cornell product ) When compared with ECG of 20-Dec-2021 15:31, PREVIOUS ECG IS PRESENT Confirmed by Nanetta Batty [phone]) on 08/03/2023 12:10:05 PM    ASSESSMENT AND PLAN:   HLD (hyperlipidemia) History of hyperlipidemia on statin therapy and Zetia with lipid profile performed 02/05/2023 revealing total cholesterol 133, LDL 59 HDL 51.  Severe aortic regurgitation History of severe aortic insufficiency status post bioprosthetic aortic valve replacement by Dr. Maren Beach 01/31/2019 with a 21 mm Inspira Edwards valve.  Her EF improved ultimately from 40 to 45% preop up to 50 to 60 to 65% on 12/21/2021.  She has had increasing shortness of breath over the last 6 months for unclear reasons.  I am going to recheck a 2D echocardiogram.  PAF (paroxysmal atrial fibrillation) (HCC) History of PAF maintaining sinus rhythm on Eliquis oral anticoagulation.  Essential hypertension History of essential hypertension with blood pressure measured today at 120/66.  She is on diltiazem and losartan.  Thoracic aortic aneurysm History is of a small ascending thoracic aortic aneurysm measuring 43 mm by 2D echocardiogram.  We will recheck this.  Non-ischemic  cardiomyopathy [facility]) Cardiac catheterization performed by Dr. Shirlee Latch 01/02/2019 revealed minimal nonobstructive CAD.     Runell Gess MD FACP,FACC,FAHA, [facility] 08/03/2023 12:34 PM

## 2023-08-03 NOTE — Telephone Encounter (Signed)
PT calling because she wants to switch from Dr. Sherene Sires to [facility]. Her Dr. has a referral in place asking for a change and Soonest Appt/Waitlist. We need to be sure this change is auth by both Dr/'s per Protocol. Please get approval from both  and send to front desk to sched. If both providers are OK with this. TY.

## 2023-08-04 ENCOUNTER — Telehealth (HOSPITAL_COMMUNITY): Payer: Self-pay | Admitting: *Deleted

## 2023-08-04 NOTE — Telephone Encounter (Signed)
Called pt back to discuss Pulmonary Rehab appropriateness. Encouraged her to attend program despite her frustration with her pulmonologist. She agreed and plans to attend 08/05/23.  Ethelda Chick BS, ACSM-CEP 08/04/2023 1:33 PM

## 2023-08-05 ENCOUNTER — Encounter (HOSPITAL_COMMUNITY)
Admission: RE | Admit: 2023-08-05 | Discharge: 2023-08-05 | Disposition: A | Discharge status: Home / Self Care | Payer: Medicare Other | Source: Ambulatory Visit | Attending: Internal Medicine | Admitting: Internal Medicine

## 2023-08-05 DIAGNOSIS — J449 Chronic obstructive pulmonary disease, unspecified: Secondary | ICD-10-CM

## 2023-08-05 NOTE — Telephone Encounter (Signed)
Fine with me

## 2023-08-05 NOTE — Progress Notes (Signed)
Daily Session Note  Patient Details  Name: Dana Pruitt MRN: [id number] Date of Birth: 1941-01-10 Referring Provider:   Doristine Devoid Pulmonary Rehab Walk Test from 07/26/20[facility]  Referring Provider Dr. Sherene Sires       Encounter Date: 08/05/2023  Check In:  Session Check In - 08/05/23 1339       Check-In   Supervising physician immediately available to respond to emergencies [facility] MD immediately available    Physician(s) Rise Paganini, NP    Location [facility]    Staff Present Essie Hart, RN, BSN;Casey Charlean Sanfilippo, MS, ACSM-CEP, Exercise Physiologist    Virtual Visit No    Medication changes reported     No    Fall or balance concerns reported    No    Tobacco Cessation No Change    Warm-up and Cool-down Performed as group-led instruction    Resistance Training Performed Yes    VAD Patient? No    PAD/SET Patient? No      Pain Assessment   Currently in Pain? No/denies    Multiple Pain Sites No             Capillary Blood Glucose: No results found for this or any previous visit (from the past 24 hours).    Social History   Tobacco Use  Smoking Status Former   Current packs/day: 0.00   Average packs/day: 3.0 packs/day for 32.1 years (96.3 ttl pk-yrs)   Types: Cigarettes   Start date: 05/19/1958   Quit date: 06/30/1990   Years since quitting: 33.1  Smokeless Tobacco Never  Tobacco Comments   Stopped smoking for 2-3 years during second child.     Goals Met:  Exercise tolerated well No report of concerns or symptoms today Strength training completed today  Goals Unmet:  Not Applicable  Comments: Service time is from 1315 to 1451    Dr. Mechele Collin is Medical Director for Pulmonary Rehab at [facility].

## 2023-08-06 NOTE — Telephone Encounter (Signed)
Front desk needs to schedule appointment with Vassie Loll

## 2023-08-10 ENCOUNTER — Encounter (HOSPITAL_COMMUNITY)
Admission: RE | Admit: 2023-08-10 | Discharge: 2023-08-10 | Disposition: A | Discharge status: Home / Self Care | Payer: Medicare Other | Source: Ambulatory Visit | Attending: Internal Medicine | Admitting: Internal Medicine

## 2023-08-10 VITALS — Wt 123.5 lb

## 2023-08-10 DIAGNOSIS — J449 Chronic obstructive pulmonary disease, unspecified: Secondary | ICD-10-CM | POA: Diagnosis not present

## 2023-08-10 NOTE — Progress Notes (Signed)
Daily Session Note  Patient Details  Name: Dana Pruitt MRN: [id number] Date of Birth: May 15, 1941 Referring Provider:   Doristine Devoid Pulmonary Rehab Walk Test from 07/26/2023 in [facility]  Referring Provider Dr. Sherene Sires       Encounter Date: 08/10/2023  Check In:  Session Check In - 08/10/23 1329       Check-In   Supervising physician immediately available to respond to emergencies [facility] MD immediately available    Physician(s) Carlyon Shadow, NP    Location [facility]    Staff Present Essie Hart, RN, BSN;Kaeson Kleinert Katrinka Blazing, Zella Richer, MS, ACSM-CEP, Exercise Physiologist;Randi Dionisio Paschal, ACSM-CEP, Exercise Physiologist;Johnny Hale Bogus, MS, Exercise Physiologist    Virtual Visit No    Medication changes reported     No    Fall or balance concerns reported    No    Tobacco Cessation No Change    Warm-up and Cool-down Performed as group-led instruction    Resistance Training Performed Yes    VAD Patient? No    PAD/SET Patient? No      Pain Assessment   Currently in Pain? No/denies    Multiple Pain Sites No             Capillary Blood Glucose: No results found for this or any previous visit (from the past 24 hours).   Exercise Prescription Changes - 08/10/23 1500       Response to Exercise   Blood Pressure (Admit) 100/68    Blood Pressure (Exercise) 120/68    Blood Pressure (Exit) 112/64    Heart Rate (Admit) 69 bpm    Heart Rate (Exercise) 95 bpm    Heart Rate (Exit) 73 bpm    Oxygen Saturation (Admit) 92 %    Oxygen Saturation (Exercise) 93 %    Oxygen Saturation (Exit) 90 %    Rating of Perceived Exertion (Exercise) 13    Perceived Dyspnea (Exercise) 1    Duration Continue with 30 min of aerobic exercise without signs/symptoms of physical distress.    Intensity THRR unchanged      Progression   Progression Continue to progress workloads to maintain intensity without  signs/symptoms of physical distress.      Resistance Training   Training Prescription Yes    Weight red bands    Reps 10-15    Time 10 Minutes      Oxygen   Oxygen Continuous    Liters 3-4      Treadmill   MPH 2    Grade 0    Minutes 15    METs 2.3      NuStep   Level 2    Minutes 15    METs 2.1      Oxygen   Maintain Oxygen Saturation 88% or higher             Social History   Tobacco Use  Smoking Status Former   Current packs/day: 0.00   Average packs/day: 3.0 packs/day for 32.1 years (96.3 ttl pk-yrs)   Types: Cigarettes   Start date: 05/19/1958   Quit date: 06/30/1990   Years since quitting: 33.1  Smokeless Tobacco Never  Tobacco Comments   Stopped smoking for 2-3 years during second child.     Goals Met:  Proper associated with RPD/PD & O2 Sat Independence with exercise equipment Exercise tolerated well No report of concerns or symptoms today Strength training completed today  Goals Unmet:  Not Applicable  Comments: Service time is from 1315 to 1445.    Dr. Mechele Collin is Medical Director for Pulmonary Rehab at [facility].

## 2023-08-11 ENCOUNTER — Telehealth: Payer: Self-pay | Admitting: Internal Medicine

## 2023-08-11 NOTE — Progress Notes (Signed)
Pulmonary Individual Treatment Plan  Patient Details  Name: Dana Pruitt MRN: [id number] Date of Birth: 1941-06-08 Referring Provider:   Doristine Devoid Pulmonary Rehab Walk Test from 07/26/2023 in [facility]  Referring Provider Dr. Sherene Sires       Initial Encounter Date:  Flowsheet Row Pulmonary Rehab Walk Test from 07/26/2023 in [facility]  Date 07/26/23       Visit Diagnosis: Stage 2 moderate COPD by GOLD classification (HCC)  Patient's Home Medications on Admission:   Current Outpatient Medications:    albuterol (PROAIR HFA) 108 (90 Base) MCG/ACT inhaler, 2 puffs every 4 hours as needed only  if your can't catch your breath, Disp: 1 each, Rfl: 6   atorvastatin (LIPITOR) 20 MG tablet, Take 20 mg by mouth daily. , Disp: , Rfl:    BREZTRI AEROSPHERE 160-9-4.8 MCG/ACT AERO, INHALE 2 PUFFS INTO THE LUNGS TWICE DAILY, Disp: 10.7 g, Rfl: 11   carvedilol (COREG) 6.25 MG tablet, TAKE 1 AND 1/2 TABLETS(9.375 MG) BY MOUTH TWICE DAILY WITH A MEAL, Disp: 45 tablet, Rfl: 1   Cholecalciferol (VITAMIN D3) 25 MCG (1000 UT) CAPS, Take 1,000 Units by mouth 2 (two) times daily.  2 capsules a day for 2 weeks and then 1 daily Orally Once a day for 30 days, Disp: , Rfl:    denosumab (PROLIA) 60 MG/ML SOSY injection, Inject 60 mg into the skin every 6 (six) months., Disp: , Rfl:    diltiazem (CARDIZEM) 30 MG tablet, Take 1 tablet every 4 hours AS NEEDED for AFIB heart rate >100, Disp: 45 tablet, Rfl: 1   ELIQUIS 2.5 MG TABS tablet, TAKE 1 TABLET(2.5 MG) BY MOUTH TWICE DAILY, Disp: 180 tablet, Rfl: 1   ezetimibe (ZETIA) 10 MG tablet, Take 10 mg by mouth daily., Disp: , Rfl:    FEROSUL 325 (65 Fe) MG tablet, Take 325 mg by mouth every other day., Disp: , Rfl:    fluticasone (FLONASE) 50 MCG/ACT nasal spray, SHAKE LIQUID AND USE 2 SPRAYS IN EACH NOSTRIL DAILY, Disp: 16 g, Rfl: 3   guaiFENesin  (MUCINEX) 600 MG 12 hr tablet, Take 1 tablet (600 mg total) by mouth 2 (two) times daily., Disp: 30 tablet, Rfl: 0   losartan (COZAAR) 25 MG tablet, Take 25 mg by mouth daily., Disp: , Rfl:    nitrofurantoin (MACRODANTIN) 100 MG capsule, Take 100 mg by mouth at bedtime., Disp: , Rfl:    sertraline (ZOLOFT) 100 MG tablet, Take 100 mg by mouth 2 (two) times daily., Disp: , Rfl:    valACYclovir (VALTREX) 1000 MG tablet, Take 500 mg by mouth as needed., Disp: , Rfl:  No current facility-administered medications for this encounter.  Facility-Administered Medications Ordered in Other Encounters:    sodium chloride flush (NS) 0.9 % injection 3 mL, 3 mL, Intravenous, Q12H, Laurey Morale, MD  Past Medical History: Past Medical History:  Diagnosis Date   Anemia    "several times over the years" (10/17/2012)   Anxiety    Breast cancer (HCC) 02/2010   right breast   CAD (coronary artery disease)    a. LHC 12/2018: mild non-obstructive CAD   Carcinoma of breast treated with adjuvant hormone therapy (HCC) 02/2010   Femara   Chronic systolic CHF    a. Echo in 2015: LVEF of 35-40%, b. 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     Labs: Review Flowsheet  More data exists      Latest Ref Rng & Units 01/31/2019 02/01/2019 02/02/2019 12/20/2021 12/21/2021  Labs for ITP Cardiac and Pulmonary Rehab  Cholestrol 0 - 200 mg/dL - - - - 161   LDL (calc) 0 - 99 mg/dL - - - - 69   HDL-C >09 mg/dL - - - - 41   Trlycerides <150 mg/dL - - - - 73   Hemoglobin A1c 4.8 - 5.6 % - - - 5.9  -  PH, Arterial 7.350 - 7.450 7.386  7.429  7.415  7.317  7.310  7.356  - - -  PCO2 arterial 32.0 - 48.0 mmHg 36.2  32.7  42.5  41.0  43.5  36.7  - - -  Bicarbonate 20.0 - 28.0 mmol/L 22.0  21.6  27.2  21.1  22.0  20.7  - - -  TCO2 22 - 32 mmol/L 24  23  23  29  22  23  22   - 22  -  Acid-base deficit 0.0 - 2.0 mmol/L 3.0  2.0  5.0  4.0  5.0  - - -  O2 Saturation % 95.0  100.0  100.0  89.0  93.0  90.0  63.6  - -    Details       Multiple values from one day are sorted in reverse-chronological order         Capillary Blood Glucose: Lab Results  Component Value Date   GLUCAP 151 (H) 02/04/2019   GLUCAP 98 02/04/2019   GLUCAP 95 02/04/2019   GLUCAP 130 (H) 02/03/2019   GLUCAP 101 (H) 02/03/2019     Pulmonary Assessment Scores:  Pulmonary Assessment Scores     Row Name 07/26/23 1204         ADL UCSD   ADL Phase Entry     SOB Score total 52       CAT Score   CAT Score 24       mMRC Score   mMRC Score 2             UCSD: Self-administered rating of dyspnea associated with activities of daily living (ADLs) 6-point scale (0 = "not at all" to 5 = "maximal or unable to do because of breathlessness")  Scoring Scores range from 0 to 120.  Minimally important difference is 5 units  CAT: CAT can identify the health impairment of COPD patients and is better correlated with disease progression.  CAT has a scoring range of zero to 40.  The CAT score is classified into four groups of low (less than 10), medium  (10 - 20), high (21-30) and very high (31-40) based on the impact level of disease on health status. A CAT score over 10 suggests significant symptoms.  A worsening CAT score could be explained by an exacerbation, poor medication adherence, poor inhaler technique, or progression of COPD or comorbid conditions.  CAT MCID is 2 points  mMRC: mMRC (Modified Medical Research Council) Dyspnea Scale is used to assess the degree of baseline functional disability in patients of respiratory disease due to dyspnea. No minimal important difference is established. A decrease in score of 1 point or greater is considered a positive change.   Pulmonary Function Assessment:  Pulmonary Function Assessment - 07/26/23 1129       Breath   Bilateral Breath Sounds Rhonchi    Shortness of Breath Yes;Limiting activity             Exercise Target Goals: Exercise Program Goal: Individual exercise prescription set using results from initial 6 min walk test and THRR while considering  patient's activity barriers and safety.   Exercise Prescription Goal: Initial exercise prescription builds to 30-45 minutes a day of aerobic activity, 2-3 days per week.  Home exercise guidelines will be given to patient during program as part of exercise prescription that the participant will acknowledge.  Activity Barriers & Risk Stratification:  Activity Barriers & Cardiac Risk Stratification - 07/26/23 1127       Activity Barriers & Cardiac Risk Stratification   Activity Barriers Deconditioning;Muscular Weakness;Shortness of Breath;History of Falls    Cardiac Risk Stratification Moderate             6 Minute Walk:  6 Minute Walk     Row Name 07/26/23 1351         6 Minute Walk   Phase Initial     Distance 1080 feet     Walk Time 6 minutes     # of Rest Breaks 2     MPH 2.05     METS 2.41     RPE 14     Perceived Dyspnea  3     VO2 Peak 8.45     Symptoms No     Resting HR 75 bpm     Resting BP 140/78      Resting Oxygen Saturation  92 %     Exercise Oxygen Saturation  during 6 min walk 75 %     Max Ex. HR 89 bpm     Max Ex. BP 180/96     2 Minute Post BP 180/90       Interval HR   1 Minute HR 77     2 Minute HR 76     3 Minute HR 89     4 Minute HR 87     5 Minute HR 89     6 Minute HR 87     2 Minute Post HR 67     Interval Heart Rate? Yes       Interval Oxygen   Interval Oxygen? Yes     Baseline Oxygen Saturation % 92 %     1 Minute Oxygen Saturation % 75 %  stopped 1:10-1:26, placed on 2L     1 Minute Liters of Oxygen 0 L     2 Minute Oxygen Saturation % 90 %     2 Minute Liters of Oxygen 2 L     3 Minute  Oxygen Saturation % 82 %  stopped 2:40-2:50, placed on 3L     3 Minute Liters of Oxygen 3 L     4 Minute Oxygen Saturation % 94 %     4 Minute Liters of Oxygen 3 L     5 Minute Oxygen Saturation % 92 %     5 Minute Liters of Oxygen 3 L     6 Minute Oxygen Saturation % 90 %     6 Minute Liters of Oxygen 3 L     2 Minute Post Oxygen Saturation % 100 %     2 Minute Post Liters of Oxygen 3 L              Oxygen Initial Assessment:  Oxygen Initial Assessment - 07/26/23 1128       Home Oxygen   Home Oxygen Device None    Sleep Oxygen Prescription None    Home Exercise Oxygen Prescription None    Home Resting Oxygen Prescription None      Initial 6 min Walk   Oxygen Used Continuous    Liters per minute 3      Program Oxygen Prescription   Program Oxygen Prescription Continuous    Liters per minute 3      Intervention   Short Term Goals To learn and exhibit compliance with exercise, home and travel O2 prescription;To learn and understand importance of monitoring SPO2 with pulse oximeter and demonstrate accurate use of the pulse oximeter.;To learn and understand importance of maintaining oxygen saturations>88%;To learn and demonstrate proper pursed lip breathing techniques or other breathing techniques. ;To learn and demonstrate proper use of respiratory  medications    Long  Term Goals Exhibits compliance with exercise, home  and travel O2 prescription;Maintenance of O2 saturations>88%;Compliance with respiratory medication;Verbalizes importance of monitoring SPO2 with pulse oximeter and return demonstration;Exhibits proper breathing techniques, such as pursed lip breathing or other method taught during program session;Demonstrates proper use of MDI's             Oxygen Re-Evaluation:  Oxygen Re-Evaluation     Row Name 08/04/23 1411             Program Oxygen Prescription   Program Oxygen Prescription Continuous       Liters per minute 3         Home Oxygen   Home Oxygen Device None       Sleep Oxygen Prescription None       Home Exercise Oxygen Prescription None       Home Resting Oxygen Prescription None         Goals/Expected Outcomes   Short Term Goals To learn and exhibit compliance with exercise, home and travel O2 prescription;To learn and understand importance of monitoring SPO2 with pulse oximeter and demonstrate accurate use of the pulse oximeter.;To learn and understand importance of maintaining oxygen saturations>88%;To learn and demonstrate proper pursed lip breathing techniques or other breathing techniques. ;To learn and demonstrate proper use of respiratory medications       Long  Term Goals Exhibits compliance with exercise, home  and travel O2 prescription;Maintenance of O2 saturations>88%;Compliance with respiratory medication;Verbalizes importance of monitoring SPO2 with pulse oximeter and return demonstration;Exhibits proper breathing techniques, such as pursed lip breathing or other method taught during program session;Demonstrates proper use of MDI's       Goals/Expected Outcomes Compliance and understanding of oxygen saturation monitoring and breathing techniques to decrease shortness of breath.  Oxygen Discharge (Final Oxygen Re-Evaluation):  Oxygen Re-Evaluation - 08/04/23 1411        Program Oxygen Prescription   Program Oxygen Prescription Continuous    Liters per minute 3      Home Oxygen   Home Oxygen Device None    Sleep Oxygen Prescription None    Home Exercise Oxygen Prescription None    Home Resting Oxygen Prescription None      Goals/Expected Outcomes   Short Term Goals To learn and exhibit compliance with exercise, home and travel O2 prescription;To learn and understand importance of monitoring SPO2 with pulse oximeter and demonstrate accurate use of the pulse oximeter.;To learn and understand importance of maintaining oxygen saturations>88%;To learn and demonstrate proper pursed lip breathing techniques or other breathing techniques. ;To learn and demonstrate proper use of respiratory medications    Long  Term Goals Exhibits compliance with exercise, home  and travel O2 prescription;Maintenance of O2 saturations>88%;Compliance with respiratory medication;Verbalizes importance of monitoring SPO2 with pulse oximeter and return demonstration;Exhibits proper breathing techniques, such as pursed lip breathing or other method taught during program session;Demonstrates proper use of MDI's    Goals/Expected Outcomes Compliance and understanding of oxygen saturation monitoring and breathing techniques to decrease shortness of breath.             Initial Exercise Prescription:  Initial Exercise Prescription - 07/26/23 1300       Date of Initial Exercise RX and Referring Provider   Date 07/26/23    Referring Provider Dr. Sherene Sires    Expected Discharge Date 10/21/23      Oxygen   Oxygen Continuous    Liters 3    Maintain Oxygen Saturation 88% or higher      NuStep   Level 1    SPM 75    Minutes 15    METs 2      Track   Laps 10    Minutes 15    METs 2.54      Prescription Details   Frequency (times per week) 2    Duration Progress to 30 minutes of continuous aerobic without signs/symptoms of physical distress      Intensity   THRR 40-80% of Max  Heartrate 56-111    Ratings of Perceived Exertion 11-13    Perceived Dyspnea 0-4      Progression   Progression Continue to progress workloads to maintain intensity without signs/symptoms of physical distress.      Resistance Training   Training Prescription Yes    Weight red bands    Reps 10-15             Perform Capillary Blood Glucose checks as needed.  Exercise Prescription Changes:   Exercise Prescription Changes     Row Name 08/10/23 1500             Response to Exercise   Blood Pressure (Admit) 100/68       Blood Pressure (Exercise) 120/68       Blood Pressure (Exit) 112/64       Heart Rate (Admit) 69 bpm       Heart Rate (Exercise) 95 bpm       Heart Rate (Exit) 73 bpm       Oxygen Saturation (Admit) 92 %       Oxygen Saturation (Exercise) 93 %       Oxygen Saturation (Exit) 90 %       Rating of Perceived Exertion (Exercise) 13  Perceived Dyspnea (Exercise) 1       Duration Continue with 30 min of aerobic exercise without signs/symptoms of physical distress.       Intensity THRR unchanged         Progression   Progression Continue to progress workloads to maintain intensity without signs/symptoms of physical distress.         Resistance Training   Training Prescription Yes       Weight red bands       Reps 10-15       Time 10 Minutes         Oxygen   Oxygen Continuous       Liters 3-4         Treadmill   MPH 2       Grade 0       Minutes 15       METs 2.3         NuStep   Level 2       Minutes 15       METs 2.1         Oxygen   Maintain Oxygen Saturation 88% or higher                Exercise Comments:   Exercise Comments     Row Name 08/05/23 1525           Exercise Comments Brenlee completed first day of Pulmonary Rehab. She exercised for 15 min on the track and Nustep. Maryfer averaged 2.57 METs on the track and 2.0 METs at level 2 on the Nustep. She performed the warmup and cooldown standing holding onto a chair for  balance. Discussed METs.                Exercise Goals and Review:   Exercise Goals     Row Name 07/26/23 1128             Exercise Goals   Increase Physical Activity Yes       Intervention Provide advice, education, support and counseling about physical activity/exercise needs.;Develop an individualized exercise prescription for aerobic and resistive training based on initial evaluation findings, risk stratification, comorbidities and participant's personal goals.       Expected Outcomes Short Term: Attend rehab on a regular basis to increase amount of physical activity.;Long Term: Add in home exercise to make exercise part of routine and to increase amount of physical activity.;Long Term: Exercising regularly at least 3-5 days a week.       Increase Strength and Stamina Yes       Intervention Provide advice, education, support and counseling about physical activity/exercise needs.;Develop an individualized exercise prescription for aerobic and resistive training based on initial evaluation findings, risk stratification, comorbidities and participant's personal goals.       Expected Outcomes Short Term: Increase workloads from initial exercise prescription for resistance, speed, and METs.;Short Term: Perform resistance training exercises routinely during rehab and add in resistance training at home;Long Term: Improve cardiorespiratory fitness, muscular endurance and strength as measured by increased METs and functional capacity ( )       Able to understand and use rate of perceived exertion (RPE) scale Yes       Intervention Provide education and explanation on how to use RPE scale       Expected Outcomes Short Term: Able to use RPE daily in rehab to express subjective intensity level;Long Term:  Able to use RPE to guide intensity level when exercising  independently       Able to understand and use Dyspnea scale Yes       Intervention Provide education and explanation on how to use  Dyspnea scale       Expected Outcomes Short Term: Able to use Dyspnea scale daily in rehab to express subjective sense of shortness of breath during exertion;Long Term: Able to use Dyspnea scale to guide intensity level when exercising independently       Knowledge and understanding of Target Heart Rate Range (THRR) Yes       Intervention Provide education and explanation of THRR including how the numbers were predicted and where they are located for reference       Expected Outcomes Short Term: Able to state/look up THRR;Long Term: Able to use THRR to govern intensity when exercising independently;Short Term: Able to use daily as guideline for intensity in rehab       Understanding of Exercise Prescription Yes       Intervention Provide education, explanation, and written materials on patient's individual exercise prescription       Expected Outcomes Short Term: Able to explain program exercise prescription;Long Term: Able to explain home exercise prescription to exercise independently                Exercise Goals Re-Evaluation :  Exercise Goals Re-Evaluation     Row Name 08/04/23 1411             Exercise Goal Re-Evaluation   Exercise Goals Review Increase Physical Activity;Increase Strength and Stamina;Able to understand and use rate of perceived exertion (RPE) scale;Knowledge and understanding of Target Heart Rate Range (THRR);Understanding of Exercise Prescription;Able to understand and use Dyspnea scale       Comments Pt will begin exercise 08/05/23. Will monitor and progress as tolerated.       Expected Outcomes Through exercise at rehab and home, the patient will decrease shortness of breath of daily activities and feel confident in carrying out an exercise regimen at home.                Discharge Exercise Prescription (Final Exercise Prescription Changes):  Exercise Prescription Changes - 08/10/23 1500       Response to Exercise   Blood Pressure (Admit) 100/68     Blood Pressure (Exercise) 120/68    Blood Pressure (Exit) 112/64    Heart Rate (Admit) 69 bpm    Heart Rate (Exercise) 95 bpm    Heart Rate (Exit) 73 bpm    Oxygen Saturation (Admit) 92 %    Oxygen Saturation (Exercise) 93 %    Oxygen Saturation (Exit) 90 %    Rating of Perceived Exertion (Exercise) 13    Perceived Dyspnea (Exercise) 1    Duration Continue with 30 min of aerobic exercise without signs/symptoms of physical distress.    Intensity THRR unchanged      Progression   Progression Continue to progress workloads to maintain intensity without signs/symptoms of physical distress.      Resistance Training   Training Prescription Yes    Weight red bands    Reps 10-15    Time 10 Minutes      Oxygen   Oxygen Continuous    Liters 3-4      Treadmill   MPH 2    Grade 0    Minutes 15    METs 2.3      NuStep   Level 2    Minutes 15    METs 2.1  Oxygen   Maintain Oxygen Saturation 88% or higher             Nutrition:  Target Goals: Understanding of nutrition guidelines, daily intake of sodium 1500mg , cholesterol 200mg , calories 30% from fat and 7% or less from saturated fats, daily to have 5 or more servings of fruits and vegetables.  Biometrics:    Nutrition Therapy Plan and Nutrition Goals:   Nutrition Assessments:  MEDIFICTS Score Key: >=70 Need to make dietary changes  40-70 Heart Healthy Diet <= 40 Therapeutic Level Cholesterol Diet   Picture Your Plate Scores: <36 Unhealthy dietary pattern with much room for improvement. 41-50 Dietary pattern unlikely to meet recommendations for good health and room for improvement. 51-60 More healthful dietary pattern, with some room for improvement.  >60 Healthy dietary pattern, although there may be some specific behaviors that could be improved.    Nutrition Goals Re-Evaluation:   Nutrition Goals Discharge (Final Nutrition Goals Re-Evaluation):   Psychosocial: Target Goals: Acknowledge presence  or absence of significant depression and/or stress, maximize coping skills, provide positive support system. Participant is able to verbalize types and ability to use techniques and skills needed for reducing stress and depression.  Initial Review & Psychosocial Screening:  Initial Psych Review & Screening - 07/26/23 1123       Initial Review   Current issues with Current Depression;Current Psychotropic Meds;Current Stress Concerns    Source of Stress Concerns Chronic Illness;Unable to participate in former interests or hobbies    Comments Ambry states she feels depressed some days. She is also dealing with the stress of taking care of her husband and dealing with her own health conditions.      Family Dynamics   Good Support System? Yes      Barriers   Psychosocial barriers to participate in program Psychosocial barriers identified (see note);The patient should benefit from training in stress management and relaxation.      Screening Interventions   Interventions Encouraged to exercise;To provide support and resources with identified psychosocial needs    Expected Outcomes Short Term goal: Identification and review with participant of any Quality of Life or Depression concerns found by scoring the questionnaire.;Long Term goal: The participant improves quality of Life and PHQ9 Scores as seen by post scores and/or verbalization of changes;Short Term goal: Utilizing psychosocial counselor, staff and physician to assist with identification of specific Stressors or current issues interfering with healing process. Setting desired goal for each stressor or current issue identified.;Long Term Goal: Stressors or current issues are controlled or eliminated.             Quality of Life Scores:  Scores of 19 and below usually indicate a poorer quality of life in these areas.  A difference of  2-3 points is a clinically meaningful difference.  A difference of 2-3 points in the total score of the  Quality of Life Index has been associated with significant improvement in overall quality of life, self-image, physical symptoms, and general health in studies assessing change in quality of life.  PHQ-9: Review Flowsheet       07/26/2023 05/24/2019 04/03/2019  Depression screen PHQ 2/9  Decreased Interest 2 0 0  Down, Depressed, Hopeless 2 0 0  PHQ - 2 Score 4 0 0  Altered sleeping 2 - -  Tired, decreased energy 3 - -  Change in appetite 1 - -  Feeling bad or failure about yourself  0 - -  Trouble concentrating 0 - -  Moving slowly or fidgety/restless 0 - -  Suicidal thoughts 0 - -  PHQ-9 Score 10 - -  Difficult doing work/chores Somewhat difficult - -   Interpretation of Total Score  Total Score Depression Severity:  1-4 = Minimal depression, 5-9 = Mild depression, 10-14 = Moderate depression, 15-19 = Moderately severe depression, 20-27 = Severe depression   Psychosocial Evaluation and Intervention:  Psychosocial Evaluation - 07/26/23 1127       Psychosocial Evaluation & Interventions   Interventions Encouraged to exercise with the program and follow exercise prescription;Stress management education    Comments Mirielle admits to feeling depressed some days. She is also under alot of stress due to her lung condition and having to need oxygen. 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 She does have a strong support system of family and friends. She is compliant with taking her psychotropic meds and denies needing a referral to a mental health specialist at this time.    Expected Outcomes For Seerat to participate in PR with less depression and stress    Continue Psychosocial Services  Follow up required by staff             Psychosocial Re-Evaluation:  Psychosocial Re-Evaluation     Row Name 08/04/23 1011             Psychosocial Re-Evaluation   Current issues with Current Depression;History of Depression;Current Psychotropic  Meds;Current Sleep Concerns       Comments No changes since orientation. Silvie has not started the program yet       Expected Outcomes For Marisah to participate in PR free of any psy/soc barriers or concerns. To have decreased stress and reduced s/s of depression       Interventions Encouraged to attend Pulmonary Rehabilitation for the exercise       Continue Psychosocial Services  No Follow up required                Psychosocial Discharge (Final Psychosocial Re-Evaluation):  Psychosocial Re-Evaluation - 08/04/23 1011       Psychosocial Re-Evaluation   Current issues with Current Depression;History of Depression;Current Psychotropic Meds;Current Sleep Concerns    Comments No changes since orientation. Adora has not started the program yet    Expected Outcomes For Forever to participate in PR free of any psy/soc barriers or concerns. To have decreased stress and reduced s/s of depression    Interventions Encouraged to attend Pulmonary Rehabilitation for the exercise    Continue Psychosocial Services  No Follow up required             Education: Education Goals: Education classes will be provided on a weekly basis, covering required topics. Participant will state understanding/return demonstration of topics presented.  Learning Barriers/Preferences:  Learning Barriers/Preferences - 07/26/23 1127       Learning Barriers/Preferences   Learning Barriers None    Learning Preferences Skilled Demonstration             Education Topics: Know Your Numbers Group instruction that is supported by a PowerPoint presentation. Instructor discusses importance of knowing and understanding resting, exercise, and post-exercise oxygen saturation, heart rate, and blood pressure. Oxygen saturation, heart rate, blood pressure, rating of perceived exertion, and dyspnea are reviewed along with a normal range for these values.    Exercise for the Pulmonary Patient Group instruction that is  supported by a PowerPoint presentation. Instructor discusses benefits of exercise, core components of exercise, frequency,  duration, and intensity of an exercise routine, importance of utilizing pulse oximetry during exercise, safety while exercising, and options of places to exercise outside of rehab.    MET Level  Group instruction provided by PowerPoint, verbal discussion, and written material to support subject matter. Instructor reviews what METs are and how to increase METs.    Pulmonary Medications Verbally interactive group education provided by instructor with focus on inhaled medications and proper administration.   Anatomy and Physiology of the Respiratory System Group instruction provided by PowerPoint, verbal discussion, and written material to support subject matter. Instructor reviews respiratory cycle and anatomical components of the respiratory system and their functions. Instructor also reviews differences in obstructive and restrictive respiratory diseases with examples of each.    Oxygen Safety Group instruction provided by PowerPoint, verbal discussion, and written material to support subject matter. There is an overview of "What is Oxygen" and "Why do we need it".  Instructor also reviews how to create a safe environment for oxygen use, the importance of using oxygen as prescribed, and the risks of noncompliance. There is a brief discussion on traveling with oxygen and resources the patient may utilize.   Oxygen Use Group instruction provided by PowerPoint, verbal discussion, and written material to discuss how supplemental oxygen is prescribed and different types of oxygen supply systems. Resources for more information are provided.    Breathing Techniques Group instruction that is supported by demonstration and informational handouts. Instructor discusses the benefits of pursed lip and diaphragmatic breathing and detailed demonstration on how to perform both.      Risk Factor Reduction Group instruction that is supported by a PowerPoint presentation. Instructor discusses the definition of a risk factor, different risk factors for pulmonary disease, and how the heart and lungs work together. Flowsheet Row PULMONARY REHAB CHRONIC OBSTRUCTIVE PULMONARY DISEASE from 08/05/2023 in [facility]  Date 08/05/23  Educator EP  Instruction Review Code 1- Verbalizes Understanding       Pulmonary Diseases Group instruction provided by PowerPoint, verbal discussion, and written material to support subject matter. Instructor gives an overview of the different type of pulmonary diseases. There is also a discussion on risk factors and symptoms as well as ways to manage the diseases.   Stress and Energy Conservation Group instruction provided by PowerPoint, verbal discussion, and written material to support subject matter. Instructor gives an overview of stress and the impact it can have on the body. Instructor also reviews ways to reduce stress. There is also a discussion on energy conservation and ways to conserve energy throughout the day.   Warning Signs and Symptoms Group instruction provided by PowerPoint, verbal discussion, and written material to support subject matter. Instructor reviews warning signs and symptoms of stroke, heart attack, cold and flu. Instructor also reviews ways to prevent the spread of infection.   Other Education Group or individual verbal, written, or video instructions that support the educational goals of the pulmonary rehab program.    Knowledge Questionnaire Score:  Knowledge Questionnaire Score - 07/26/23 1204       Knowledge Questionnaire Score   Pre Score 14/18             Core Components/Risk Factors/Patient Goals at Admission:  Personal Goals and Risk Factors at Admission - 07/26/23 1127       Core Components/Risk Factors/Patient Goals on Admission    Improve shortness of breath with ADL's Yes    Intervention Provide education, individualized  exercise plan and daily activity instruction to help decrease symptoms of SOB with activities of daily living.    Expected Outcomes Short Term: Improve cardiorespiratory fitness to achieve a reduction of symptoms when performing ADLs;Long Term: Be able to perform more ADLs without symptoms or delay the onset of symptoms    Increase knowledge of respiratory medications and ability to use respiratory devices properly  Yes    Intervention Provide education and demonstration as needed of appropriate use of medications, inhalers, and oxygen therapy.    Expected Outcomes Short Term: Achieves understanding of medications use. Understands that oxygen is a medication prescribed by physician. Demonstrates appropriate use of inhaler and oxygen therapy.;Long Term: Maintain appropriate use of medications, inhalers, and oxygen therapy.    Stress Yes    Intervention Offer individual and/or small group education and counseling on adjustment to heart disease, stress management and health-related lifestyle change. Teach and support self-help strategies.;Refer participants experiencing significant psychosocial distress to appropriate mental health specialists for further evaluation and treatment. When possible, include family members and significant others in education/counseling sessions.    Expected Outcomes Short Term: Participant demonstrates changes in health-related behavior, relaxation and other stress management skills, ability to obtain effective social support, and compliance with psychotropic medications if prescribed.;Long Term: Emotional wellbeing is indicated by absence of clinically significant psychosocial distress or social isolation.             Core Components/Risk Factors/Patient Goals Review:   Goals and Risk Factor Review     Row Name 08/04/23 1013             Core Components/Risk Factors/Patient  Goals Review   Personal Goals Review Improve shortness of breath with ADL's;Develop more efficient breathing techniques such as purse lipped breathing and diaphragmatic breathing and practicing self-pacing with activity.;Increase knowledge of respiratory medications and ability to use respiratory devices properly.       Review Unable to assess, Hera has not started the program yet       Expected Outcomes For Kwana to develop more efficient breathing techniques such as purse lipped breathing and diaphragmatic breathing; and practice self-pacing with activity and improve her shortness of breath with ADL's, to increase her knowledge of respiratory medications and correctly use respiratory devices, to improve her shortness of breath with ADLs                Core Components/Risk Factors/Patient Goals at Discharge (Final Review):   Goals and Risk Factor Review - 08/04/23 1013       Core Components/Risk Factors/Patient Goals Review   Personal Goals Review Improve shortness of breath with ADL's;Develop more efficient breathing techniques such as purse lipped breathing and diaphragmatic breathing and practicing self-pacing with activity.;Increase knowledge of respiratory medications and ability to use respiratory devices properly.    Review Unable to assess, Aminat has not started the program yet    Expected Outcomes For Kniyah to develop more efficient breathing techniques such as purse lipped breathing and diaphragmatic breathing; and practice self-pacing with activity and improve her shortness of breath with ADL's, to increase her knowledge of respiratory medications and correctly use respiratory devices, to improve her shortness of breath with ADLs             ITP Comments: Pt is making expected progress toward Pulmonary Rehab goals after completing 2 session(s). Recommend continued exercise, life style modification, education, and utilization of breathing techniques to increase stamina and  strength, while also decreasing shortness of breath with exertion.  Dr. Mechele Collin is Medical Director for Pulmonary Rehab at [facility].

## 2023-08-11 NOTE — Telephone Encounter (Signed)
Patient would like a call back. She has some questions about her oxygen and previous appointment [phone]

## 2023-08-12 ENCOUNTER — Encounter (HOSPITAL_COMMUNITY): Payer: Medicare Other

## 2023-08-12 ENCOUNTER — Telehealth (HOSPITAL_COMMUNITY): Payer: Self-pay | Admitting: *Deleted

## 2023-08-12 NOTE — Telephone Encounter (Signed)
Received voice mail message. Will not attend Pulmonary rehab this afternoon due to having a horrible cold.

## 2023-08-13 NOTE — Telephone Encounter (Signed)
PT calling again. States she has several questions for Verlon Au and she is asking for her to call her back. Verlon Au was the nurse that was in with Her and Dr. Sherene Sires during her appt.Her # is [phone]  Questions about 02.Feels like she needs it more than just some of the time. States you were so kind and you did a walk test with so you you were familiar with her condition. Felt her rapport with Dr. Sherene Sires was not that good. Please put in a req to switch to Dr. Vassie Loll with both Dr. Sherene Sires and Dr. Vassie Loll (Her cardiologist recommended Dr. Vassie Loll originally.)

## 2023-08-13 NOTE — Telephone Encounter (Signed)
 NFN

## 2023-08-13 NOTE — Telephone Encounter (Signed)
I called and spoke with the pt  She had multiple questions regarding o2 and how long she will need to be on it  I made her appt with Katie next wk to discuss  Nothing further needed

## 2023-08-16 ENCOUNTER — Encounter: Payer: Self-pay | Admitting: Nurse Practitioner

## 2023-08-16 ENCOUNTER — Ambulatory Visit: Payer: Medicare Other | Admitting: Nurse Practitioner

## 2023-08-16 ENCOUNTER — Ambulatory Visit (INDEPENDENT_AMBULATORY_CARE_PROVIDER_SITE_OTHER): Payer: Medicare Other

## 2023-08-16 VITALS — BP 124/67 | HR 95 | Resp 16 | Ht 64.0 in | Wt 128.3 lb

## 2023-08-16 DIAGNOSIS — J9611 Chronic respiratory failure with hypoxia: Secondary | ICD-10-CM

## 2023-08-16 DIAGNOSIS — J441 Chronic obstructive pulmonary disease with (acute) exacerbation: Secondary | ICD-10-CM

## 2023-08-16 DIAGNOSIS — R0902 Hypoxemia: Secondary | ICD-10-CM | POA: Diagnosis not present

## 2023-08-16 DIAGNOSIS — J300 Vasomotor rhinitis: Secondary | ICD-10-CM

## 2023-08-16 DIAGNOSIS — R0609 Other forms of dyspnea: Secondary | ICD-10-CM

## 2023-08-16 DIAGNOSIS — I2721 Secondary pulmonary arterial hypertension: Secondary | ICD-10-CM | POA: Diagnosis not present

## 2023-08-16 DIAGNOSIS — J449 Chronic obstructive pulmonary disease, unspecified: Secondary | ICD-10-CM | POA: Diagnosis not present

## 2023-08-16 DIAGNOSIS — I272 Pulmonary hypertension, unspecified: Secondary | ICD-10-CM

## 2023-08-16 DIAGNOSIS — I5022 Chronic systolic (congestive) heart failure: Secondary | ICD-10-CM

## 2023-08-16 DIAGNOSIS — I509 Heart failure, unspecified: Secondary | ICD-10-CM

## 2023-08-16 DIAGNOSIS — R0602 Shortness of breath: Secondary | ICD-10-CM | POA: Diagnosis not present

## 2023-08-16 DIAGNOSIS — I480 Paroxysmal atrial fibrillation: Secondary | ICD-10-CM

## 2023-08-16 LAB — BASIC METABOLIC PANEL
BUN: 20 mg/dL (ref 6–23)
CO2: 28 meq/L (ref 19–32)
Calcium: 10 mg/dL (ref 8.4–10.5)
Chloride: 106 meq/L (ref 96–112)
Creatinine, Ser: 0.98 mg/dL (ref 0.40–1.20)
GFR: 54.23 mL/min — ABNORMAL LOW (ref >60.00)
Glucose, Bld: 115 mg/dL — ABNORMAL HIGH (ref 70–99)
Potassium: 4.2 meq/L (ref 3.5–5.1)
Sodium: 141 meq/L (ref 135–145)

## 2023-08-16 LAB — D-DIMER, QUANTITATIVE: D-Dimer, Quant: <0.19 ug{FEU}/mL (ref <0.50)

## 2023-08-16 LAB — POCT INFLUENZA A/B
Influenza A, POC: NEGATIVE
Influenza B, POC: NEGATIVE

## 2023-08-16 LAB — POCT COVID BINAXNOW CARD: SARS Coronavirus 2 Ag: NEGATIVE

## 2023-08-16 MED ORDER — PREDNISONE 10 MG PO TABS: ORAL_TABLET | ORAL | 0 refills | Status: DC

## 2023-08-16 MED ORDER — METHYLPREDNISOLONE ACETATE 80 MG/ML IJ SUSP
80.0000 mg | Freq: Once | INTRAMUSCULAR | Status: AC
Start: 2023-08-16 — End: 2023-08-16
  Administered 2023-08-16: 80 mg via INTRAMUSCULAR

## 2023-08-16 MED ORDER — IPRATROPIUM BROMIDE 0.06 % NA SOLN: 2.0000 | Freq: Four times a day (QID) | NASAL | 12 refills | Status: AC

## 2023-08-16 MED ORDER — ALBUTEROL SULFATE (2.5 MG/3ML) 0.083% IN NEBU: 2.5000 mg | INHALATION_SOLUTION | Freq: Four times a day (QID) | RESPIRATORY_TRACT | 5 refills | Status: DC | PRN

## 2023-08-16 NOTE — Patient Instructions (Addendum)
 Continue Albuterol inhaler 2 puffs or 3 mL neb every 6 hours as needed for shortness of breath or wheezing. Notify if symptoms persist despite rescue inhaler/neb use. Use nebs 2-3 times a day  Continue Breztri 2 puffs Twice daily. Brush tongue and rinse mouth afterwards Continue flonase nasal spray 2 sprays each nostril daily Continue guaifenesin 1 tab Twice daily as needed for cough/congestion  Ipratropium nasal spray 1-2 sprays each nostril Three times a day for nasal congestion/drainage  Prednisone taper. 4 tabs for 3 days, then 3 tabs for 3 days, 2 tabs for 3 days, then 1 tab for 3 days, then stop. Take in AM with food. Start tomorrow   You weren't able to keep your oxygen levels up on the POC unit. For now, I want you using the portable tanks or your continuous concentrator at 4 lpm for goal >88-90%   Chest x ray today Labs today   Order sent for nebulizer machine and portable tanks to your medical supply company   Follow up in 7-10 days with Dr. Vassie Loll or Florentina Addison Nemiah Bubar,NP. Ok to double book KC on day not already DB. If symptoms do not improve or worsen, please contact office for sooner follow up or seek emergency care.

## 2023-08-16 NOTE — Progress Notes (Unsigned)
 @Patient  ID: Dana Pruitt, female    DOB: 01-09-1941, 83 y.o.   MRN: [id number]  Chief Complaint  Patient presents with   Follow-up    Referring provider: Merri Brunette, MD  HPI: 83 year old female, former smoker followed for COPD and chronic respiratory failure. She is a patient of Dr. Thurston Pruitt and last seen in office 07/13/2023. Past medical history significant for CHF, HTN, NICM, PH, PAF on low dose Eliquis, history of TAVR, thoracic aortic aneurysm, HLD, depression, history of breast cancer  TEST/EVENTS:  01/05/2019 PFT: FVC 79, FEV1 60, ratio 62, TLC 86, DLCO 39 12/21/2021 echo: EF 6065%.  G1 DD.  RV size and function normal.  Normal PASP.  Trivial MR.  Aortic valve has been repaired/replaced.  No significant valvular disease 11/04/2022 CT chest without contrast: Atherosclerosis.  Aortic valve replacement.  Enlarged pulmonic trunk.  Centrilobular emphysema.  Subpleural radiation scarring in the right lung.  Tiny nodules in the right upper lobe, 3 mm or less, unchanged.  4.3 cm ascending aortic aneurysm, stable  07/13/2023: OV with Dr. Sherene Pruitt. Maintained on Breztri. SOB with exertion and low SpO2 since heart surgery 01/2019. Walk test with desaturation to 85% on room air - titrated up to 4 lpm on POC and was able to maintain >90%. Referred for oxygen. Referral to pulmonary rehab.   08/16/2023: Today-follow-up Discussed the use of AI scribe software for clinical note transcription with the patient, who gave verbal consent to proceed.  History of Present Illness   Dana Pruitt is an 83 year old female with COPD, chronic respiratory failure, heart failure, atrial fibrillation, and pulmonary hypertension who presents with oxygen dependency and respiratory concerns. She is accompanied by her friend, Dana Pruitt. She was seen in January by Dr. Sherene Pruitt after her PCP, Dr. Renne Pruitt, diagnosed her with possible respiratory infection. She was started on supplemental oxygen at this visit with POC unit 4 lpm with  activity and continuous at night. She has received this but she has a lot of questions/concerns.   She is experiencing issues with oxygen dependency, having recently started on oxygen therapy. She uses a home concentrator unit, maintaining oxygen levels in the low nineties on therapy, but levels drop into the seventies and eighties when she removes the oxygen during activities like showering or cooking. She finds it difficult to function without oxygen during these activities.  Three weeks ago, she was seen for a suspected respiratory infection and was prescribed antibiotics and steroids by her PCP, which she completed. She feels better since completing the medication but still experiences some respiratory symptoms.   She reports a recent increase in mucus production and sinus congestion over the past few days. Mucus is clear to white. Coughing slightly more than her usual. She doesn't necessarily feel any short winded than her last visit or have more chest congestion. She has not had fever, chills, or body aches. She uses Flonase nasal spray once a day and takes Mucinex 600 mg twice a day. She's not had any known sick exposures. Denies hemoptysis, leg swelling, orthopnea, weight gain, wheezing. Eating and drinking well.   She has a significant cardiac history, including heart failure, atrial fibrillation, and a cardiomyopathy, along with pulmonary hypertension. She was hospitalized for double pneumonia approximately ten to twelve years ago, which she thinks is when her respiratory problems started. She also has a history of heavy smoking, resulting in moderate COPD/emphysema. She's currently on [place], which she uses daily. She doesn't use her neb treatments  very frequently.   She has been attending pulmonary rehabilitation, where she was using 3-4liters of oxygen during exercise. She has experienced issues with her pulsed oxygen concentrator not functioning properly. It has been turning off on her.  She's also still had some low oxygen levels on the pulsed therapy, into the 80's.       No Known Allergies  Immunization History  Administered Date(s) Administered   Fluad Quad(high Dose 65+) 04/29/2019   Influenza Split 03/22/2012   Influenza, High Dose Seasonal PF 05/02/2014, 05/05/2018, 03/22/2021   Influenza, Quadrivalent, Recombinant, Inj, Pf 03/07/2020   Influenza,inj,Quad PF,6+ Mos 05/22/2013   Influenza-Unspecified 05/22/2016   PFIZER(Purple Top)SARS-COV-2 Vaccination 07/11/2019, 07/31/2019   PNEUMOCOCCAL CONJUGATE-20 12/15/2021   Pneumococcal Conjugate-13 06/23/2007   Zoster, Live 06/22/2009    Past Medical History:  Diagnosis Date   Anemia    "several times over the years" (10/17/2012)   Anxiety    Breast cancer (HCC) 02/2010   right breast   CAD (coronary artery disease)    a. LHC 12/2018: mild non-obstructive CAD   Carcinoma of breast treated with adjuvant hormone therapy (HCC) 02/2010   Femara   Chronic systolic CHF    a. Echo in 2015: LVEF of 35-40%, b. Echo in 12/2021: LVEF of 60-65%   COPD (chronic obstructive pulmonary disease) (HCC)    Depression    GERD (gastroesophageal reflux disease)    diet controlled, no med   Hollenhorst plaque, left eye    apparently resolved at recent ophthalmology visit   Hyperlipidemia    Hypertension    IDC, RightStage II, 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   Moderate aortic insufficiency    Non-ischemic cardiomyopathy (HCC)    Paroxysmal atrial fibrillation (HCC)    Pneumonia    "that's why I'm here" (10/17/2012)   S/P AVR (aortic valve replacement) 04/26/2019   Stroke (HCC)    12/2021    Tobacco History: Social History   Tobacco Use  Smoking Status Former   Current packs/day: 0.00   Average packs/day: 3.0 packs/day for 32.1 years (96.3 ttl pk-yrs)   Types: Cigarettes   Start date: 05/19/1958   Quit date: 06/30/1990   Years since quitting: 33.1  Smokeless Tobacco Never  Tobacco Comments   Stopped smoking for 2-3 years  during second child.    Counseling given: Not Answered Tobacco comments: Stopped smoking for 2-3 years during second child.    Outpatient Medications Prior to Visit  Medication Sig Dispense Refill   albuterol (PROAIR HFA) 108 (90 Base) MCG/ACT inhaler 2 puffs every 4 hours as needed only  if your can't catch your breath 1 each 6   atorvastatin (LIPITOR) 20 MG tablet Take 20 mg by mouth daily.      BREZTRI AEROSPHERE 160-9-4.8 MCG/ACT AERO INHALE 2 PUFFS INTO THE LUNGS TWICE DAILY 10.7 g 11   carvedilol (COREG) 6.25 MG tablet TAKE 1 AND 1/2 TABLETS(9.375 MG) BY MOUTH TWICE DAILY WITH A MEAL 45 tablet 1   Cholecalciferol (VITAMIN D3) 25 MCG (1000 UT) CAPS Take 1,000 Units by mouth 2 (two) times daily.  2 capsules a day for 2 weeks and then 1 daily Orally Once a day for 30 days     denosumab (PROLIA) 60 MG/ML SOSY injection Inject 60 mg into the skin every 6 (six) months.     diltiazem (CARDIZEM) 30 MG tablet Take 1 tablet every 4 hours AS NEEDED for AFIB heart rate >100 45 tablet 1   ELIQUIS 2.5 MG TABS tablet TAKE 1 TABLET(2.5  MG) BY MOUTH TWICE DAILY 180 tablet 1   ezetimibe (ZETIA) 10 MG tablet Take 10 mg by mouth daily.     FEROSUL 325 (65 Fe) MG tablet Take 325 mg by mouth every other day.     fluticasone (FLONASE) 50 MCG/ACT nasal spray SHAKE LIQUID AND USE 2 SPRAYS IN EACH NOSTRIL DAILY 16 g 3   guaiFENesin (MUCINEX) 600 MG 12 hr tablet Take 1 tablet (600 mg total) by mouth 2 (two) times daily. 30 tablet 0   losartan (COZAAR) 25 MG tablet Take 25 mg by mouth daily.     nitrofurantoin (MACRODANTIN) 100 MG capsule Take 100 mg by mouth at bedtime.     sertraline (ZOLOFT) 100 MG tablet Take 100 mg by mouth 2 (two) times daily.     valACYclovir (VALTREX) 1000 MG tablet Take 500 mg by mouth as needed.     Facility-Administered Medications Prior to Visit  Medication Dose Route Frequency Provider Last Rate Last Admin   sodium chloride flush (NS) 0.9 % injection 3 mL  3 mL Intravenous Q12H  Laurey Morale, MD         Review of Systems:   Constitutional: No night sweats, fevers, chills, fatigue, +lassitude, weight gain  HEENT: No headaches, difficulty swallowing, tooth/dental problems, or sore throat. No sneezing, itching, ear ache +nasal congestion, post nasal drip, hoarse voice  CV:  No chest pain, orthopnea, PND, swelling in lower extremities, anasarca, dizziness, palpitations, syncope Resp: + shortness of breath with exertion; occasionally productive cough. No excess mucus or change in color of mucus. No hemoptysis. No wheezing.  No chest wall deformity GI:  No heartburn, indigestion, abdominal pain, nausea, vomiting, diarrhea, change in bowel habits, loss of appetite, bloody stools.  GU: No dysuria, change in color of urine, urgency or frequency.  No flank pain, no hematuria  Skin: No rash, lesions, ulcerations MSK:  No joint pain or swelling.   Neuro: No dizziness or lightheadedness.  Psych: No depression or anxiety. Mood stable.     Physical Exam:  BP 124/67   Resp 16   Ht 5\' 4"  (1.626 m)   Wt 128 lb 4.8 oz (58.2 kg)   LMP  (LMP Unknown)   BMI 22.02 kg/m   GEN: Pleasant, interactive, well-kempt; chronically-ill appearing;non-toxic and in no acute distress HEENT:  Normocephalic and atraumatic. EACs patent bilaterally. TM pearly gray with present light reflex bilaterally. PERRLA. Sclera white. Nasal turbinates pale, moist and patent bilaterally. Clear rhinorrhea present. Oropharynx pink and moist, without exudate or edema. No lesions, ulcerations, or postnasal drip.  NECK:  Supple w/ fair ROM. No JVD present. Normal carotid impulses w/o bruits. Thyroid symmetrical with no goiter or nodules palpated. No lymphadenopathy.   CV: Irregular rhythm, rate controlled, no m/r/g, no peripheral edema. Pulses intact, +2 bilaterally. No cyanosis, pallor or clubbing. PULMONARY:  Unlabored, regular breathing. Diminished bilaterally with end expiratory wheezes A&P w/o  wheezes/rales/rhonchi. No accessory muscle use.  GI: BS present and normoactive. Soft, non-tender to palpation. No organomegaly or masses detected. MSK: No erythema, warmth or tenderness. Cap refil <2 sec all extrem. No deformities or joint swelling noted.  Neuro: A/Ox3. No focal deficits noted.   Skin: Warm, no lesions or rashe Psych: Normal affect and behavior. Judgement and thought content appropriate.     Lab Results:  CBC    Component Value Date/Time   WBC 4.8 07/28/2023 1349   RBC 3.80 (L) 07/28/2023 1349   HGB 12.5 07/28/2023 1349   HGB 13.1  08/09/2020 1229   HGB 12.8 11/27/2014 1016   HCT 37.8 07/28/2023 1349   HCT 38.5 03/23/2023 1119   HCT 37.1 11/27/2014 1016   PLT 178 07/28/2023 1349   PLT 248 08/09/2020 1229   MCV 99.5 07/28/2023 1349   MCV 94 08/09/2020 1229   MCV 92.8 11/27/2014 1016   MCH 32.9 07/28/2023 1349   MCHC 33.1 07/28/2023 1349   RDW 15.1 07/28/2023 1349   RDW 13.7 08/09/2020 1229   RDW 12.9 11/27/2014 1016   LYMPHSABS 0.9 07/28/2023 1349   LYMPHSABS 1.4 11/27/2014 1016   MONOABS 0.4 07/28/2023 1349   MONOABS 0.4 11/27/2014 1016   EOSABS 0.4 07/28/2023 1349   EOSABS 0.2 11/27/2014 1016   BASOSABS 0.1 07/28/2023 1349   BASOSABS 0.0 11/27/2014 1016    BMET    Component Value Date/Time   NA 140 03/23/2023 1117   NA 139 12/11/2021 1003   NA 138 11/27/2014 1017   K 4.5 03/23/2023 1117   K 4.9 11/27/2014 1017   CL 107 03/23/2023 1117   CL 105 03/31/2012 1410   CO2 28 03/23/2023 1117   CO2 25 11/27/2014 1017   GLUCOSE 109 (H) 03/23/2023 1117   GLUCOSE 108 11/27/2014 1017   GLUCOSE 97 03/31/2012 1410   BUN 18 03/23/2023 1117   BUN 15 12/11/2021 1003   BUN 18.7 11/27/2014 1017   CREATININE 1.19 (H) 03/23/2023 1117   CREATININE 1.0 11/27/2014 1017   CALCIUM 10.5 (H) 03/23/2023 1117   CALCIUM 10.1 11/27/2014 1017   GFRNONAA 46 (L) 03/23/2023 1117   GFRNONAA 49 02/10/2023 1323   GFRAA 48 (L) 03/13/2020 1504    BNP    Component Value  Date/Time   BNP 181.6 (H) 12/11/2021 1003   BNP 650.1 (H) 02/18/2020 1655     Imaging:  No results found.  Administration History     None          Latest Ref Rng & Units 01/05/2019    8:37 AM 01/04/2017   10:59 AM  PFT Results  FVC-Pre L 2.13  2.17   FVC-Predicted Pre % 79  79   FVC-Post L 2.10  2.09   FVC-Predicted Post % 78  76   Pre FEV1/FVC % % 57  61   Post FEV1/FCV % % 62  63   FEV1-Pre L 1.22  1.32   FEV1-Predicted Pre % 60  64   FEV1-Post L 1.30  1.31   DLCO uncorrected ml/min/mmHg 7.27  10.22   DLCO UNC% % 39  43   DLCO corrected ml/min/mmHg 7.27  10.04   DLCO COR %Predicted % 39  43   DLVA Predicted % 52  58   TLC L 4.29  5.23   TLC % Predicted % 86  105   RV % Predicted % 88  136     No results found for: "NITRICOXIDE"      Assessment & Plan:   No problem-specific Assessment & Plan notes found for this encounter. Assessment and Plan    Chronic Obstructive Pulmonary Disease (COPD) Progressive dyspnea, now with worsening hypoxia.  Unable to tolerate pulsed therapy today. Required 4 lpm continuous O2. Her POC does also appear to be malfunctioning and will turn off after 5 minutes; battery at 21% charge. She did have a recent exacerbation possibly due to respiratory infection; recovered well and was doing well in pulmonary rehab. Has had acute viral symptoms over the past 2-3 days. COVID/flu negative. Will treat her for possible  exacerbation with prednisone taper. Hold on abx therapy unless evidence of superimposed infection on imaging as symptoms are likely viral and she does not have increased, purulent sputum. Continue aggressive maintenance regimen. Could consider addition of inhaler phosphodiesterase inhibitor with Oht[place]ayre or change to triple therapy nebs. Possible this is cardiac related as well. Action plan in place - Increase home oxygen to 4 liters continuous flow - Repeat chest x-ray  - Consider CT scan if abnormalities persist on x-ray -  Swab for COVID-19 and flu - Start prednisone taper for AECOPD - Track labs to rule out cardiac etiology or possible PE  Pulmonary Hypertension Secondary to left-sided heart disease and chronic lung disease, complicating respiratory and cardiac management. Continuous oxygen therapy is necessary. - Continue monitoring oxygen levels and adjust flow as needed - Evaluate echocardiogram results for further management; upcoming with cardiology   Heart Failure with Atrial Fibrillation Significant cardiac history including heart failure and atrial fibrillation. Rate controlled today. Does not appear significantly volume overloaded but she is up 5 lb. Check BNP/BMET today. Evaluate imaging for evidence of pulm edema. Consider diuretic therapy pending results. Follow up with cardiology  - Ensure continuous oxygen therapy to prevent hypoxia - Await results of upcoming echocardiogram on March 6 to assess cardiac function - BNP/BMET today   Rhinitis Nasal congestion and runny nose, likely viral but possibly exacerbated by continuous oxygen use. Seems to have some upper airway irritation with hoarse voice quality. Discussed potential viral causes and use of nasal sprays. - Swab for COVID-19 and flu - Continue flonase nasal spray - Add ipratropium nasal spray  - Continue Mucinex 600 mg twice daily   Chronic hypoxic respiratory failure  Increased oxygen requirements. See above. Hypoxia can exacerbate cardiac conditions, leading to increased strain and potential complications. Adequate oxygenation is crucial to prevent these complications and reduce risk of hospitalization. Verbalized understanding. Recent CBC with nl hgb. Goal >88-90%. Rule out PE with d dimer.  - Continue supplemental oxygen to maintain oxygen levels >88-90% - Urgent order for portable tanks sent to DME - Advised not to use POC until seen back or able to maintain O2 levels  - D dimer to rule out PE; will need STAT CTA PE protocol if  positive  Follow-up - Follow-up appointment with Dr. Vassie Loll in mid-May - Repeat chest x-ray today - Echocardiogram  on March 6 - Track labs today.        Advised if symptoms do not improve or worsen, to please contact office for sooner follow up or seek emergency care.   I spent 50 minutes of dedicated to the care of this patient on the date of this encounter to include pre-visit review of records, face-to-face time with the patient discussing conditions above, post visit ordering of testing, clinical documentation with the electronic health record, making appropriate referrals as documented, and communicating necessary findings to members of the patients care team.  Noemi Chapel, NP 08/16/2023  Pt aware and understands NP's role.

## 2023-08-17 ENCOUNTER — Other Ambulatory Visit: Payer: Self-pay | Admitting: Nurse Practitioner

## 2023-08-17 ENCOUNTER — Encounter (HOSPITAL_COMMUNITY): Admission: RE | Admit: 2023-08-17 | Payer: Medicare Other | Source: Ambulatory Visit

## 2023-08-17 DIAGNOSIS — I5033 Acute on chronic diastolic (congestive) heart failure: Secondary | ICD-10-CM

## 2023-08-17 LAB — BRAIN NATRIURETIC PEPTIDE: Pro B Natriuretic peptide (BNP): 473 pg/mL — ABNORMAL HIGH (ref 0.0–100.0)

## 2023-08-17 MED ORDER — FUROSEMIDE 20 MG PO TABS: 20.0000 mg | ORAL_TABLET | Freq: Every day | ORAL | 0 refills | Status: DC

## 2023-08-18 ENCOUNTER — Telehealth (HOSPITAL_COMMUNITY): Payer: Self-pay

## 2023-08-18 NOTE — Telephone Encounter (Signed)
 Pt called and informed staff she will miss Pulm Rehab tomorrow. She saw Pulm on the 24th and they advised her to stay out until next week. Will cancel appt tomorrow.

## 2023-08-19 ENCOUNTER — Encounter (HOSPITAL_COMMUNITY): Payer: Medicare Other

## 2023-08-20 ENCOUNTER — Encounter: Payer: Self-pay | Admitting: Nurse Practitioner

## 2023-08-23 DIAGNOSIS — J9611 Chronic respiratory failure with hypoxia: Secondary | ICD-10-CM | POA: Diagnosis not present

## 2023-08-23 DIAGNOSIS — J449 Chronic obstructive pulmonary disease, unspecified: Secondary | ICD-10-CM | POA: Diagnosis not present

## 2023-08-24 ENCOUNTER — Encounter (HOSPITAL_COMMUNITY)
Admission: RE | Admit: 2023-08-24 | Discharge: 2023-08-24 | Disposition: A | Discharge status: Home / Self Care | Payer: Medicare Other | Source: Ambulatory Visit | Attending: Internal Medicine | Admitting: Internal Medicine

## 2023-08-24 VITALS — Wt 122.1 lb

## 2023-08-24 DIAGNOSIS — J449 Chronic obstructive pulmonary disease, unspecified: Secondary | ICD-10-CM | POA: Diagnosis not present

## 2023-08-24 NOTE — Progress Notes (Signed)
 Daily Session Note  Patient Details  Name: Dana Pruitt MRN: [id number] Date of Birth: April 03, 1941 Referring Provider:   Doristine Devoid Pulmonary Rehab Walk Test from 07/26/2023 in [facility]  Referring Provider Dr. Sherene Sires       Encounter Date: 08/24/2023  Check In:  Session Check In - 08/24/23 1348       Check-In   Supervising physician immediately available to respond to emergencies [facility] MD immediately available    Location [facility]    Staff Present Essie Hart, RN, BSN;Casey Katrinka Blazing, Zella Richer, MS, ACSM-CEP, Exercise Physiologist;Randi Dionisio Paschal, ACSM-CEP, Exercise Physiologist    Virtual Visit No    Medication changes reported     No    Comments med list reviewed    Fall or balance concerns reported    No    Tobacco Cessation No Change    Warm-up and Cool-down Performed as group-led instruction    Resistance Training Performed Yes    VAD Patient? No    PAD/SET Patient? No      Pain Assessment   Currently in Pain? No/denies    Pain Score 0-No pain    Multiple Pain Sites No             Capillary Blood Glucose: No results found for this or any previous visit (from the past 24 hours).   Exercise Prescription Changes - 08/24/23 1500       Response to Exercise   Blood Pressure (Admit) 128/70    Blood Pressure (Exercise) 130/70    Blood Pressure (Exit) 102/58    Heart Rate (Admit) 66 bpm    Heart Rate (Exercise) 81 bpm    Heart Rate (Exit) 69 bpm    Oxygen Saturation (Admit) 96 %    Oxygen Saturation (Exercise) 90 %    Oxygen Saturation (Exit) 93 %    Rating of Perceived Exertion (Exercise) 11    Perceived Dyspnea (Exercise) 1    Duration Continue with 30 min of aerobic exercise without signs/symptoms of physical distress.    Intensity THRR unchanged      Progression   Progression Continue to progress workloads to maintain intensity without signs/symptoms of physical distress.       Resistance Training   Training Prescription Yes    Weight red bands    Reps 10-15    Time 10 Minutes      Oxygen   Oxygen Continuous    Liters 4      Treadmill   MPH 2    Grade 0    Minutes 15    METs 2.3      NuStep   Level 2    Minutes 15    METs 2.7      Oxygen   Maintain Oxygen Saturation 88% or higher             Social History   Tobacco Use  Smoking Status Former   Current packs/day: 0.00   Average packs/day: 3.0 packs/day for 32.1 years (96.3 ttl pk-yrs)   Types: Cigarettes   Start date: 05/19/1958   Quit date: 06/30/1990   Years since quitting: 33.1  Smokeless Tobacco Never  Tobacco Comments   Stopped smoking for 2-3 years during second child.     Goals Met:  Proper associated with RPD/PD & O2 Sat Exercise tolerated well No report of concerns or symptoms today Strength training completed today  Goals Unmet:  Not  Applicable  Comments: Service time is from 1312 to 1447.    Dr. Mechele Collin is Medical Director for Pulmonary Rehab at [facility].

## 2023-08-26 ENCOUNTER — Encounter (HOSPITAL_COMMUNITY)
Admission: RE | Admit: 2023-08-26 | Discharge: 2023-08-26 | Disposition: A | Discharge status: Home / Self Care | Payer: Medicare Other | Source: Ambulatory Visit | Attending: Internal Medicine | Admitting: Internal Medicine

## 2023-08-26 ENCOUNTER — Ambulatory Visit (HOSPITAL_COMMUNITY): Payer: Medicare Other | Attending: Internal Medicine

## 2023-08-26 VITALS — Wt 124.3 lb

## 2023-08-26 DIAGNOSIS — I2510 Atherosclerotic heart disease of native coronary artery without angina pectoris: Secondary | ICD-10-CM | POA: Insufficient documentation

## 2023-08-26 DIAGNOSIS — I5022 Chronic systolic (congestive) heart failure: Secondary | ICD-10-CM | POA: Diagnosis not present

## 2023-08-26 DIAGNOSIS — I10 Essential (primary) hypertension: Secondary | ICD-10-CM | POA: Diagnosis not present

## 2023-08-26 DIAGNOSIS — E785 Hyperlipidemia, unspecified: Secondary | ICD-10-CM | POA: Diagnosis not present

## 2023-08-26 DIAGNOSIS — Z952 Presence of prosthetic heart valve: Secondary | ICD-10-CM | POA: Diagnosis not present

## 2023-08-26 DIAGNOSIS — J449 Chronic obstructive pulmonary disease, unspecified: Secondary | ICD-10-CM | POA: Diagnosis not present

## 2023-08-26 LAB — ECHOCARDIOGRAM COMPLETE
AR max vel: 0.72 cm2
AV Area VTI: 0.72 cm2
AV Area mean vel: 0.79 cm2
AV Mean grad: 12 mmHg
AV Peak grad: 22.6 mmHg
Ao pk vel: 2.38 m/s
Area-P 1/2: 1.91 cm2
S' Lateral: 3.1 cm

## 2023-08-26 NOTE — Progress Notes (Signed)
 Daily Session Note  Patient Details  Name: Dana Pruitt MRN: [id number] Date of Birth: Dec 28, 1940 Referring Provider:   Doristine Devoid Pulmonary Rehab Walk Test from 07/26/20[facility] in [facility]  Referring Provider Dr. Sherene Sires       Encounter Date: 08/26/2023  Check In:  Session Check In - 08/26/23 1335       Check-In   Supervising physician immediately available to respond to emergencies [facility] MD immediately available    Physician(s) Robin Searing, NP    Location [facility]    Staff Present Essie Hart, RN, BSN;Casey Katrinka Blazing, Zella Richer, MS, ACSM-CEP, Exercise Physiologist;Randi [facility], ACSM-CEP, Exercise Physiologist    Virtual Visit No    Medication changes reported     No    Fall or balance concerns reported    No    Tobacco Cessation No Change    Warm-up and Cool-down Performed as group-led instruction    Resistance Training Performed Yes    VAD Patient? No    PAD/SET Patient? No      Pain Assessment   Currently in Pain? No/denies    Pain Score 0-No pain    Multiple Pain Sites No             Capillary Blood Glucose: No results found for this or any previous visit (from the past 24 hours).    Social History   Tobacco Use  Smoking Status Former   Current packs/day: 0.00   Average packs/day: 3.0 packs/day for 32.1 years (96.3 ttl pk-yrs)   Types: Cigarettes   Start date: 05/19/1958   Quit date: 06/30/1990   Years since quitting: 33.1  Smokeless Tobacco Never  Tobacco Comments   Stopped smoking for 2-3 years during second child.     Goals Met:  Proper associated with RPD/PD & O2 Sat Exercise tolerated well No report of concerns or symptoms today Strength training completed today  Goals Unmet:  Not Applicable  Comments: Service time is from 1315 to 1450.    Dr. Mechele Collin is Medical Director for Pulmonary Rehab at [facility].

## 2023-08-27 ENCOUNTER — Other Ambulatory Visit: Payer: Self-pay | Admitting: Nurse Practitioner

## 2023-08-27 ENCOUNTER — Ambulatory Visit: Payer: Medicare Other | Admitting: Nurse Practitioner

## 2023-08-27 ENCOUNTER — Encounter: Payer: Self-pay | Admitting: Nurse Practitioner

## 2023-08-27 ENCOUNTER — Other Ambulatory Visit (INDEPENDENT_AMBULATORY_CARE_PROVIDER_SITE_OTHER)

## 2023-08-27 VITALS — BP 134/80 | HR 60 | Ht 62.0 in | Wt 124.0 lb

## 2023-08-27 DIAGNOSIS — J309 Allergic rhinitis, unspecified: Secondary | ICD-10-CM

## 2023-08-27 DIAGNOSIS — I5033 Acute on chronic diastolic (congestive) heart failure: Secondary | ICD-10-CM

## 2023-08-27 DIAGNOSIS — J449 Chronic obstructive pulmonary disease, unspecified: Secondary | ICD-10-CM

## 2023-08-27 DIAGNOSIS — I428 Other cardiomyopathies: Secondary | ICD-10-CM

## 2023-08-27 DIAGNOSIS — J9611 Chronic respiratory failure with hypoxia: Secondary | ICD-10-CM

## 2023-08-27 DIAGNOSIS — I272 Pulmonary hypertension, unspecified: Secondary | ICD-10-CM | POA: Diagnosis not present

## 2023-08-27 DIAGNOSIS — I480 Paroxysmal atrial fibrillation: Secondary | ICD-10-CM

## 2023-08-27 DIAGNOSIS — I5022 Chronic systolic (congestive) heart failure: Secondary | ICD-10-CM | POA: Diagnosis not present

## 2023-08-27 LAB — BASIC METABOLIC PANEL
BUN: 32 mg/dL — ABNORMAL HIGH (ref 6–23)
CO2: 25 meq/L (ref 19–32)
Calcium: 9.8 mg/dL (ref 8.4–10.5)
Chloride: 103 meq/L (ref 96–112)
Creatinine, Ser: 1.08 mg/dL (ref 0.40–1.20)
GFR: 48.25 mL/min — ABNORMAL LOW (ref >60.00)
Glucose, Bld: 191 mg/dL — ABNORMAL HIGH (ref 70–99)
Potassium: 4.5 meq/L (ref 3.5–5.1)
Sodium: 138 meq/L (ref 135–145)

## 2023-08-27 MED ORDER — POTASSIUM CHLORIDE CRYS ER 10 MEQ PO TBCR: 10.0000 meq | EXTENDED_RELEASE_TABLET | Freq: Every day | ORAL | 0 refills | Status: DC

## 2023-08-27 MED ORDER — FUROSEMIDE 20 MG PO TABS: 20.0000 mg | ORAL_TABLET | Freq: Every day | ORAL | 0 refills | Status: DC

## 2023-08-27 NOTE — Assessment & Plan Note (Signed)
 Rate controlled. On chronic AC with low dose Eliquis. Compliant with [place].

## 2023-08-27 NOTE — Assessment & Plan Note (Signed)
 Moderate obstructive disease. She was treated for possible AECOPD at her last OV; however, findings were more so consistent with acute CHF exacerbation. She didn't feel any significant change with prednisone therapy. Oxygen levels have improved today following steroids and diuretic course. See above. Continue current regimen. Action plan in place. Lung exam clear today.

## 2023-08-27 NOTE — Assessment & Plan Note (Addendum)
 Significant decline in LV function with EF now 30-35% and GIDD. Concerns for volume overload at last visit with weight gain and worsening hypoxia. BNP elevated at 473, consistent for acute CHF exacerbation. She was treated with short course of lasix with improvement. After this, had her echo with above findings. Suspect this is the driving factor of her dyspnea and increased O2 requirements. Will have her resume 20 mg lasix daily until she sees cardiology. We were able to call and schedule her a visit with them next week. Check BMET today and determine potassium dosing. Advised to monitor weights at  home. Strict ED precautions. Advised that she hold off on further pulmonary rehab visits until cleared by cardiology to return.   Patient Instructions  Continue Albuterol inhaler 2 puffs or 3 mL neb every 6 hours as needed for shortness of breath or wheezing. Notify if symptoms persist despite rescue inhaler/neb use.  Continue Breztri 2 puffs Twice daily. Brush tongue and rinse mouth afterwards Continue flonase nasal spray 2 sprays each nostril daily Continue guaifenesin 1 tab Twice daily as needed for cough/congestion Continue Ipratropium nasal spray 1-2 sprays each nostril Three times a day for nasal congestion/drainage  Continue oxygen 4 lpm for goal >88-90%  We will see if you can get another battery for your pulsed oxygen concentrator. Charge it anytime you are in the car    Restart furosemide 20 mg daily. Take in AM.   Labs today - will likely start you on potassium as well to keep these levels up  You will see your heart doctor's PA next Wednesday at 915 AM.    Follow up as scheduled with Dr. Vassie Loll in May. If symptoms do not improve or worsen, please contact office for sooner follow up or seek emergency care.

## 2023-08-27 NOTE — Assessment & Plan Note (Signed)
 Follow up with cardiology next week for further management.

## 2023-08-27 NOTE — Assessment & Plan Note (Signed)
 Allergic rhinitis at baseline which she is on flonase and daily allergy pill for. Worsening rhinitis symptoms with oxygen use. She's had improvement with ipratropium. Continue current regimen

## 2023-08-27 NOTE — Progress Notes (Signed)
 @Patient  ID: Dana Pruitt, female    DOB: [birth year], 83 y.o.   MRN: [id number]  Chief Complaint  Patient presents with   Follow-up    Breathing problem and fatigue.    Referring provider: Merri Brunette, MD  HPI: 83 year old female, former smoker followed for COPD and chronic respiratory failure. She is a patient of Dr. Thurston Hole and last seen in office 08/16/2023 by [facility] NP. Past medical history significant for CHF, HTN, NICM, PH, PAF on low dose Eliquis, history of TAVR, thoracic aortic aneurysm, HLD, depression, history of breast cancer  TEST/EVENTS:  01/05/2019 PFT: FVC 79, FEV1 60, ratio 62, TLC 86, DLCO 39 12/21/2021 echo: EF 60-65%.  G1 DD.  RV size and function normal.  Normal PASP.  Trivial MR.  Aortic valve has been repaired/replaced.  No significant valvular disease 11/04/2022 CT chest without contrast: Atherosclerosis.  Aortic valve replacement.  Enlarged pulmonic trunk.  Centrilobular emphysema.  Subpleural radiation scarring in the right lung.  Tiny nodules in the right upper lobe, 3 mm or less, unchanged.  4.3 cm ascending aortic aneurysm, stable 08/16/2023 CXR: no acute airspace disease or acute process  08/16/2023 BNP 473, d dimer negative  08/26/2023 echo: EF 30-35%, GIDD. RV size nl, mildly reduced function. Normal PASP. LA mildly dilated. Mild MR.   07/13/2023: OV with Dr. Sherene Sires. Maintained on Breztri. SOB with exertion and low SpO2 since heart surgery 01/2019. Walk test with desaturation to 85% on room air - titrated up to 4 lpm on POC and was able to maintain >90%. Referred for oxygen. Referral to pulmonary rehab.   08/16/2023: Sudie Grumbling with Allison Quarry, NP. Discussed the use of AI scribe software for clinical note transcription with the patient, who gave verbal consent to proceed. History of Present Illness   Dana Pruitt is an 83 year old female with COPD, chronic respiratory failure, heart failure, atrial fibrillation, and pulmonary hypertension who presents with oxygen dependency and  respiratory concerns. She is accompanied by her friend, Dana Pruitt. She was seen in January by Dr. Sherene Sires after her PCP, Dr. Renne Crigler, diagnosed her with possible respiratory infection. She was started on supplemental oxygen at this visit with POC unit 4 lpm with activity and continuous at night. She has received this but she has a lot of questions/concerns.  She is experiencing issues with oxygen dependency, having recently started on oxygen therapy. She uses a home concentrator unit, maintaining oxygen levels in the low nineties on therapy, but levels drop into the seventies and eighties when she removes the oxygen during activities like showering or cooking. She finds it difficult to function without oxygen during these activities. Three weeks ago, she was seen for a suspected respiratory infection and was prescribed antibiotics and steroids by her PCP, which she completed. She feels better since completing the medication but still experiences some respiratory symptoms.  She reports a recent increase in mucus production and sinus congestion over the past few days. Mucus is clear to white. Coughing slightly more than her usual. She doesn't necessarily feel any short winded than her last visit or have more chest congestion. She has not had fever, chills, or body aches. She uses Flonase nasal spray once a day and takes Mucinex 600 mg twice a day. She's not had any known sick exposures. Denies hemoptysis, leg swelling, orthopnea, weight gain, wheezing. Eating and drinking well.  She has a significant cardiac history, including heart failure, atrial fibrillation, and a cardiomyopathy, along with pulmonary hypertension. She was hospitalized  for double pneumonia approximately ten to twelve years ago, which she thinks is when her respiratory problems started. She also has a history of heavy smoking, resulting in moderate COPD/emphysema. She's currently on [place], which she uses daily. She doesn't use her neb treatments very  frequently.  She has been attending pulmonary rehabilitation, where she was using 3-4liters of oxygen during exercise. She has experienced issues with her pulsed oxygen concentrator not functioning properly. It has been turning off on her. She's also still had some low oxygen levels on the pulsed therapy, into the 80's.   08/27/2023: Today - follow up Patient presents today for follow up. At her last visit, she had increasing oxygen requirements and worsening DOE. She had elevated BNP on testing, negative d dimer. CXR without acute process. She had weight gain of 7-8 lb. We treated her with short course of furosemide. She did have improvement in oxygen requirements and is able to maintain on POC again now at 4 lpm. Her weight is down to 124 lb from 128 lb, indicating improvement. She does feel like her breathing is a little bit better but no drastic change.  She had an echocardiogram yesterday which revealed EF 30-35%, GIDD. PASP nl. She has not seen cardiology yet regarding this but Dr. Allyson Sabal did review this and recommended she be seen for follow up visit. She has not scheduled this yet and had not gotten the results so we reviewed them today. Otherwise symptoms are no worse. She does not have any significant leg swelling, orthopnea, PND, wheezing. No hemoptysis. Eating and drinking well. Her sinus symptoms have improved with the ipratropium nasal spray. She's still using her Breztri twice a day. Didn't notice any change with last course of prednisone.   No Known Allergies  Immunization History  Administered Date(s) Administered   Fluad Quad(high Dose 65+) 04/29/2019   Influenza Split 03/22/2012   Influenza, High Dose Seasonal PF 05/02/2014, 05/05/2018, 03/22/2021   Influenza, Quadrivalent, Recombinant, Inj, Pf 03/07/2020   Influenza,inj,Quad PF,6+ Mos 05/22/2013   Influenza-Unspecified 05/22/2016   PFIZER(Purple Top)SARS-COV-2 Vaccination 07/11/2019, 07/31/2019   PNEUMOCOCCAL CONJUGATE-20  12/15/2021   Pneumococcal Conjugate-13 06/23/2007   Zoster, Live 06/22/2009    Past Medical History:  Diagnosis Date   Anemia    "several times over the years" (10/17/2012)   Anxiety    Breast cancer (HCC) 02/2010   right breast   CAD (coronary artery disease)    a. LHC 12/2018: mild non-obstructive CAD   Carcinoma of breast treated with adjuvant hormone therapy (HCC) 02/2010   Femara   Chronic systolic CHF    a. Echo in 2015: LVEF of 35-40%, b. Echo in 12/2021: LVEF of 60-65%   COPD (chronic obstructive pulmonary disease) (HCC)    Depression    GERD (gastroesophageal reflux disease)    diet controlled, no med   Hollenhorst plaque, left eye    apparently resolved at recent ophthalmology visit   Hyperlipidemia    Hypertension    IDC, RightStage II, 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   Moderate aortic insufficiency    Non-ischemic cardiomyopathy (HCC)    Paroxysmal atrial fibrillation (HCC)    Pneumonia    "that's why I'm here" (10/17/2012)   S/P AVR (aortic valve replacement) 04/26/2019   Stroke (HCC)    12/2021    Tobacco History: Social History   Tobacco Use  Smoking Status Former   Current packs/day: 0.00   Average packs/day: 3.0 packs/day for 32.1 years (96.3 ttl pk-yrs)   Types: Cigarettes  Start date: 05/19/1958   Quit date: 06/30/1990   Years since quitting: 33.1  Smokeless Tobacco Never  Tobacco Comments   Stopped smoking for 2-3 years during second child.    Counseling given: Not Answered Tobacco comments: Stopped smoking for 2-3 years during second child.    Outpatient Medications Prior to Visit  Medication Sig Dispense Refill   albuterol (PROAIR HFA) 108 (90 Base) MCG/ACT inhaler 2 puffs every 4 hours as needed only  if your can't catch your breath 1 each 6   albuterol (PROVENTIL) (2.5 MG/3ML) 0.083% nebulizer solution Take 3 mLs (2.5 mg total) by nebulization every 6 (six) hours as needed for wheezing or shortness of breath. 75 mL 5   atorvastatin (LIPITOR)  20 MG tablet Take 20 mg by mouth daily.      BREZTRI AEROSPHERE 160-9-4.8 MCG/ACT AERO INHALE 2 PUFFS INTO THE LUNGS TWICE DAILY 10.7 g 11   carvedilol (COREG) 6.25 MG tablet TAKE 1 AND 1/2 TABLETS(9.375 MG) BY MOUTH TWICE DAILY WITH A MEAL 45 tablet 1   Cholecalciferol (VITAMIN D3) 25 MCG (1000 UT) CAPS Take 1,000 Units by mouth 2 (two) times daily.  2 capsules a day for 2 weeks and then 1 daily Orally Once a day for 30 days     denosumab (PROLIA) 60 MG/ML SOSY injection Inject 60 mg into the skin every 6 (six) months.     diltiazem (CARDIZEM) 30 MG tablet Take 1 tablet every 4 hours AS NEEDED for AFIB heart rate >100 45 tablet 1   ELIQUIS 2.5 MG TABS tablet TAKE 1 TABLET(2.5 MG) BY MOUTH TWICE DAILY 180 tablet 1   ezetimibe (ZETIA) 10 MG tablet Take 10 mg by mouth daily.     FEROSUL 325 (65 Fe) MG tablet Take 325 mg by mouth every other day.     fluticasone (FLONASE) 50 MCG/ACT nasal spray SHAKE LIQUID AND USE 2 SPRAYS IN EACH NOSTRIL DAILY 16 g 3   guaiFENesin (MUCINEX) 600 MG 12 hr tablet Take 1 tablet (600 mg total) by mouth 2 (two) times daily. 30 tablet 0   ipratropium (ATROVENT) 0.06 % nasal spray Place 2 sprays into both nostrils 4 (four) times daily. 15 mL 12   losartan (COZAAR) 25 MG tablet Take 25 mg by mouth daily.     nitrofurantoin (MACRODANTIN) 100 MG capsule Take 100 mg by mouth at bedtime.     predniSONE (DELTASONE) 10 MG tablet 4 tabs for 3 days, then 3 tabs for 3 days, 2 tabs for 3 days, then 1 tab for 3 days, then stop 30 tablet 0   sertraline (ZOLOFT) 100 MG tablet Take 100 mg by mouth 2 (two) times daily.     valACYclovir (VALTREX) 1000 MG tablet Take 500 mg by mouth as needed.     furosemide (LASIX) 20 MG tablet Take 1 tablet (20 mg total) by mouth daily for 3 days. 3 tablet 0   Facility-Administered Medications Prior to Visit  Medication Dose Route Frequency Provider Last Rate Last Admin   sodium chloride flush (NS) 0.9 % injection 3 mL  3 mL Intravenous Q12H Laurey Morale, MD         Review of Systems:   Constitutional: No night sweats, fevers, chills, fatigue, +lassitude, weight change HEENT: No headaches, difficulty swallowing, tooth/dental problems, or sore throat. No sneezing, itching, ear ache, nasal congestion, post nasal drip CV:  No chest pain, orthopnea, PND, swelling in lower extremities, anasarca, dizziness, palpitations, syncope Resp: + shortness of  breath with exertion; occasionally productive cough (baseline). No excess mucus or change in color of mucus. No hemoptysis. No wheezing.  No chest wall deformity GI:  No heartburn, indigestion, abdominal pain, nausea, vomiting, diarrhea, change in bowel habits, loss of appetite, bloody stools.  GU: No dysuria, change in color of urine, urgency or frequency.   Skin: No rash, lesions, ulcerations MSK:  No joint pain or swelling.   Neuro: No dizziness or lightheadedness.  Psych: No depression or anxiety. Mood stable.     Physical Exam:  BP 134/80 (BP Location: Right Arm, Patient Position: Sitting, Cuff Size: Normal)   Pulse 60   Ht 5\' 2"  (1.575 m)   Wt 124 lb (56.2 kg)   LMP  (LMP Unknown)   SpO2 96% Comment: 4L POC  BMI 22.68 kg/m   GEN: Pleasant, interactive, well-kempt; chronically-ill appearing;non-toxic and in no acute distress HEENT:  Normocephalic and atraumatic. PERRLA. Sclera white. Nasal turbinates pale, moist and patent bilaterally. Clear rhinorrhea present. Oropharynx pink and moist, without exudate or edema. No lesions, ulcerations, or postnasal drip.  NECK:  Supple w/ fair ROM. No JVD present. Normal carotid impulses w/o bruits. Thyroid symmetrical with no goiter or nodules palpated. No lymphadenopathy.   CV: Irregular rhythm, rate controlled, no m/r/g, no peripheral edema. Pulses intact, +2 bilaterally. No cyanosis, pallor or clubbing. PULMONARY:  Unlabored, regular breathing. Diminished bilaterally A&P w/o wheezes/rales/rhonchi. No accessory muscle use.  GI: BS present  and normoactive. Soft, non-tender to palpation. No organomegaly or masses detected. MSK: No erythema, warmth or tenderness. Cap refil <2 sec all extrem. No deformities or joint swelling noted.  Neuro: A/Ox3. No focal deficits noted.   Skin: Warm, no lesions or rashe Psych: Normal affect and behavior. Judgement and thought content appropriate.     Lab Results:  CBC    Component Value Date/Time   WBC 4.8 07/28/2023 1349   RBC 3.80 (L) 07/28/2023 1349   HGB 12.5 07/28/2023 1349   HGB 13.1 08/09/2020 1229   HGB 12.8 11/27/2014 1016   HCT 37.8 07/28/2023 1349   HCT 38.5 03/23/2023 1119   HCT 37.1 11/27/2014 1016   PLT 178 07/28/2023 1349   PLT 248 08/09/2020 1229   MCV 99.5 07/28/2023 1349   MCV 94 08/09/2020 1229   MCV 92.8 11/27/2014 1016   MCH 32.9 07/28/2023 1349   MCHC 33.1 07/28/2023 1349   RDW 15.1 07/28/2023 1349   RDW 13.7 08/09/2020 1229   RDW 12.9 11/27/2014 1016   LYMPHSABS 0.9 07/28/2023 1349   LYMPHSABS 1.4 11/27/2014 1016   MONOABS 0.4 07/28/2023 1349   MONOABS 0.4 11/27/2014 1016   EOSABS 0.4 07/28/2023 1349   EOSABS 0.2 11/27/2014 1016   BASOSABS 0.1 07/28/2023 1349   BASOSABS 0.0 11/27/2014 1016    BMET    Component Value Date/Time   NA 138 08/27/2023 1334   NA 139 12/11/2021 1003   NA 138 11/27/2014 1017   K 4.5 08/27/2023 1334   K 4.9 11/27/2014 1017   CL 103 08/27/2023 1334   CL 105 03/31/2012 1410   CO2 25 08/27/2023 1334   CO2 25 11/27/2014 1017   GLUCOSE 191 (H) 08/27/2023 1334   GLUCOSE 108 11/27/2014 1017   GLUCOSE 97 03/31/2012 1410   BUN 32 (H) 08/27/2023 1334   BUN 15 12/11/2021 1003   BUN 18.7 11/27/2014 1017   CREATININE 1.08 08/27/2023 1334   CREATININE 1.19 (H) 03/23/2023 1117   CREATININE 1.0 11/27/2014 1017   CALCIUM 9.8 08/27/2023  1334   CALCIUM 10.1 11/27/2014 1017   GFRNONAA 46 (L) 03/23/2023 1117   GFRNONAA 49 02/10/2023 1323   GFRAA 48 (L) 03/13/2020 1504    BNP    Component Value Date/Time   BNP 181.6 (H)  12/11/2021 1003   BNP 650.1 (H) 02/18/2020 1655     Imaging:  ECHOCARDIOGRAM COMPLETE Result Date: 08/26/2023    ECHOCARDIOGRAM REPORT   Patient Name:   MADGIE DHALIWAL Date of Exam: 08/26/2023 Medical Rec #:  [id number]       Height:       64.0 in Accession #:    [id number]      Weight:       122.1 lb Date of Birth:  16-Jul-1940      BSA:          1.586 m Patient Age:    82 years        BP:           120/66 mmHg Patient Gender: F               HR:           57 bpm. Exam Location:  [address] Procedure: 2D Echo, Cardiac Doppler and Color Doppler (Both Spectral and Color            Flow Doppler were utilized during procedure). Indications:    Z95.2 S/p AVR                 I50.22 CHF  History:        Patient has prior history of Echocardiogram examinations, most                 recent 12/21/2021. CHF, COPD and Dilated ascending aorta, S/p AVR                 (21mm Edwards inspiris resilia), Arrythmias:Paroxysmal atrial                 fibrillation; Risk Factors:Hypertension, Dyslipidemia and Former                 Smoker.  Sonographer:    Samule Ohm RDCS Referring Phys: [id number] JONATHAN J BERRY IMPRESSIONS  1. Left ventricular ejection fraction, by estimation, is 30 to 35%. The left ventricle has moderately decreased function. The left ventricle demonstrates global hypokinesis with prominent septal-lateral dyssynchrony due to LBBB. There is mild concentric  left ventricular hypertrophy. Left ventricular diastolic parameters are consistent with Grade I diastolic dysfunction (impaired relaxation).  2. Right ventricular systolic function is mildly reduced. The right ventricular size is normal. There is normal pulmonary artery systolic pressure. The estimated right ventricular systolic pressure is 35.9 mmHg.  3. Left atrial size was mildly dilated.  4. The mitral valve is normal in structure. Mild mitral valve regurgitation. No evidence of mitral stenosis.  5. Bioprosthetic aortic valve, normal function. Mean  gradient 12 mmHg, no perivalvular leakage.  6. Aortic dilatation noted. There is mild dilatation of the ascending aorta, measuring 43 mm.  7. The inferior vena cava is normal in size with greater than 50% respiratory variability, suggesting right atrial pressure of 3 mmHg. FINDINGS  Left Ventricle: Left ventricular ejection fraction, by estimation, is 30 to 35%. The left ventricle has moderately decreased function. The left ventricle demonstrates global hypokinesis. The left ventricular internal cavity size was normal in size. There is mild concentric left ventricular hypertrophy. Left ventricular diastolic parameters are consistent with Grade I diastolic dysfunction (impaired relaxation). Right  Ventricle: The right ventricular size is normal. No increase in right ventricular wall thickness. Right ventricular systolic function is mildly reduced. There is normal pulmonary artery systolic pressure. The tricuspid regurgitant velocity is 2.87 m/s, and with an assumed right atrial pressure of 3 mmHg, the estimated right ventricular systolic pressure is 35.9 mmHg. Left Atrium: Left atrial size was mildly dilated. Right Atrium: Right atrial size was normal in size. Pericardium: There is no evidence of pericardial effusion. Mitral Valve: The mitral valve is normal in structure. Mild mitral valve regurgitation. No evidence of mitral valve stenosis. Tricuspid Valve: The tricuspid valve is normal in structure. Tricuspid valve regurgitation is mild. Aortic Valve: Bioprosthetic aortic valve. Mean gradient 12 mmHg, no perivalvular leakage. The aortic valve has been repaired/replaced. Aortic valve regurgitation is not visualized. Aortic valve mean gradient measures 12.0 mmHg. Aortic valve peak gradient  measures 22.6 mmHg. Aortic valve area, by VTI measures 0.72 cm. Pulmonic Valve: The pulmonic valve was normal in structure. Pulmonic valve regurgitation is trivial. Aorta: The aortic root is normal in size and structure and aortic  dilatation noted. There is mild dilatation of the ascending aorta, measuring 43 mm. Venous: The inferior vena cava is normal in size with greater than 50% respiratory variability, suggesting right atrial pressure of 3 mmHg. IAS/Shunts: No atrial level shunt detected by color flow Doppler.  LEFT VENTRICLE PLAX 2D LVIDd:         4.20 cm   Diastology LVIDs:         3.10 cm   LV e' medial:    3.37 cm/s LV PW:         1.10 cm   LV E/e' medial:  12.9 LV IVS:        1.60 cm   LV e' lateral:   5.44 cm/s LVOT diam:     1.90 cm   LV E/e' lateral: 8.0 LV SV:         36 LV SV Index:   23 LVOT Area:     2.84 cm  RIGHT VENTRICLE            IVC RV S prime:     8.05 cm/s  IVC diam: 1.30 cm TAPSE (M-mode): 1.4 cm RVSP:           35.9 mmHg LEFT ATRIUM             Index        RIGHT ATRIUM           Index LA diam:        4.00 cm 2.52 cm/m   RA Pressure: 3.00 mmHg LA Vol (A2C):   47.9 ml 30.19 ml/m  RA Area:     13.00 cm LA Vol (A4C):   37.9 ml 23.89 ml/m  RA Volume:   29.00 ml  18.28 ml/m LA Biplane Vol: 44.0 ml 27.74 ml/m  AORTIC VALVE AV Area (Vmax):    0.72 cm AV Area (Vmean):   0.79 cm AV Area (VTI):     0.72 cm AV Vmax:           237.50 cm/s AV Vmean:          158.000 cm/s AV VTI:            0.498 m AV Peak Grad:      22.6 mmHg AV Mean Grad:      12.0 mmHg LVOT Vmax:         59.90 cm/s LVOT Vmean:  44.300 cm/s LVOT VTI:          0.126 m LVOT/AV VTI ratio: 0.25  AORTA Ao Root diam: 3.60 cm Ao Asc diam:  4.30 cm MITRAL VALVE               TRICUSPID VALVE MV Area (PHT): 1.91 cm    TR Peak grad:   32.9 mmHg MV Decel Time: 398 msec    TR Vmax:        287.00 cm/s MV E velocity: 43.50 cm/s  Estimated RAP:  3.00 mmHg MV A velocity: 61.80 cm/s  RVSP:           35.9 mmHg MV E/A ratio:  0.70                            SHUNTS                            Systemic VTI:  0.13 m                            Systemic Diam: 1.90 cm Dalton McleanMD Electronically signed by Wilfred Lacy Signature Date/Time: 08/26/2023/2:59:42 PM     Final    DG Chest 2 View Result Date: 08/16/2023 CLINICAL DATA:  Short of breath, hypoxia EXAM: CHEST - 2 VIEW COMPARISON:  02/18/2020 FINDINGS: Frontal and lateral views of the chest demonstrate postsurgical changes from CABG and aortic valve replacement. The cardiac silhouette is stable. No acute airspace disease, effusion, or pneumothorax. No acute bony abnormalities. IMPRESSION: 1. Stable chest, no acute process. Electronically Signed   By: Sharlet Salina M.D.   On: 08/16/2023 18:56    methylPREDNISolone acetate (DEPO-MEDROL) injection 80 mg     Date Action Dose Route User   08/16/2023 1645 Given 80 mg Intramuscular (Left Upper Outer Quadrant) Letta Median, CMA          Latest Ref Rng & Units 01/05/2019    8:37 AM 01/04/2017   10:59 AM  PFT Results  FVC-Pre L 2.13  2.17   FVC-Predicted Pre % 79  79   FVC-Post L 2.10  2.09   FVC-Predicted Post % 78  76   Pre FEV1/FVC % % 57  61   Post FEV1/FCV % % 62  63   FEV1-Pre L 1.22  1.32   FEV1-Predicted Pre % 60  64   FEV1-Post L 1.30  1.31   DLCO uncorrected ml/min/mmHg 7.27  10.22   DLCO UNC% % 39  43   DLCO corrected ml/min/mmHg 7.27  10.04   DLCO COR %Predicted % 39  43   DLVA Predicted % 52  58   TLC L 4.29  5.23   TLC % Predicted % 86  105   RV % Predicted % 88  136     No results found for: "NITRICOXIDE"      Assessment & Plan:   Chronic systolic CHF (congestive heart failure) (HCC) Significant decline in LV function with EF now 30-35% and GIDD. Concerns for volume overload at last visit with weight gain and worsening hypoxia. BNP elevated at 473, consistent for acute CHF exacerbation. She was treated with short course of lasix with improvement. After this, had her echo with above findings. Suspect this is the driving factor of her dyspnea and increased O2 requirements. Will have her resume 20 mg lasix daily  until she sees cardiology. We were able to call and schedule her a visit with them next week. Check BMET today  and determine potassium dosing. Advised to monitor weights at  home. Strict ED precautions. Advised that she hold off on further pulmonary rehab visits until cleared by cardiology to return.   Patient Instructions  Continue Albuterol inhaler 2 puffs or 3 mL neb every 6 hours as needed for shortness of breath or wheezing. Notify if symptoms persist despite rescue inhaler/neb use.  Continue Breztri 2 puffs Twice daily. Brush tongue and rinse mouth afterwards Continue flonase nasal spray 2 sprays each nostril daily Continue guaifenesin 1 tab Twice daily as needed for cough/congestion Continue Ipratropium nasal spray 1-2 sprays each nostril Three times a day for nasal congestion/drainage  Continue oxygen 4 lpm for goal >88-90%  We will see if you can get another battery for your pulsed oxygen concentrator. Charge it anytime you are in the car    Restart furosemide 20 mg daily. Take in AM.   Labs today - will likely start you on potassium as well to keep these levels up  You will see your heart doctor's PA next Wednesday at 915 AM.    Follow up as scheduled with Dr. Vassie Loll in May. If symptoms do not improve or worsen, please contact office for sooner follow up or seek emergency care.    Pulmonary hypertension (HCC) Group 2/3. Pulmonary artery pressure nl on recent echo without significant RV strain. See above plan.  Non-ischemic cardiomyopathy (HCC) Follow up with cardiology next week for further management.   COPD  GOLD II Moderate obstructive disease. She was treated for possible AECOPD at her last OV; however, findings were more so consistent with acute CHF exacerbation. She didn't feel any significant change with prednisone therapy. Oxygen levels have improved today following steroids and diuretic course. See above. Continue current regimen. Action plan in place. Lung exam clear today.   Chronic respiratory failure with hypoxia (HCC) Improved oxygen requirements. She was able to maintain  saturations on 4 lpm POC today, which is baseline for her. There was some confusion surrounding her POC and battery life. Advised her she will need to charge this when she is in the car and ensure she leaves the house with a full battery but at 4 lpm, possible she will only get about 2 hours. May be able to get her an extra battery for backup. Will send order to DME. Advised her to continue monitoring at home for goal >88-90%. We had a lengthy discussion surrounding her oxygen requirements and prior evaluations.   Allergic rhinitis Allergic rhinitis at baseline which she is on flonase and daily allergy pill for. Worsening rhinitis symptoms with oxygen use. She's had improvement with ipratropium. Continue current regimen  PAF (paroxysmal atrial fibrillation) (HCC) Rate controlled. On chronic AC with low dose Eliquis. Compliant with [place].     Advised if symptoms do not improve or worsen, to please contact office for sooner follow up or seek emergency care.   I spent 45 minutes of dedicated to the care of this patient on the date of this encounter to include pre-visit review of records, face-to-face time with the patient discussing conditions above, post visit ordering of testing, clinical documentation with the electronic health record, making appropriate referrals as documented, and communicating necessary findings to members of the patients care team.  Noemi Chapel, NP 08/27/2023  Pt aware and understands NP's role.

## 2023-08-27 NOTE — Assessment & Plan Note (Signed)
 Improved oxygen requirements. She was able to maintain saturations on 4 lpm POC today, which is baseline for her. There was some confusion surrounding her POC and battery life. Advised her she will need to charge this when she is in the car and ensure she leaves the house with a full battery but at 4 lpm, possible she will only get about 2 hours. May be able to get her an extra battery for backup. Will send order to DME. Advised her to continue monitoring at home for goal >88-90%. We had a lengthy discussion surrounding her oxygen requirements and prior evaluations.

## 2023-08-27 NOTE — Assessment & Plan Note (Signed)
 Group 2/3. Pulmonary artery pressure nl on recent echo without significant RV strain. See above plan.

## 2023-08-27 NOTE — Patient Instructions (Addendum)
 Continue Albuterol inhaler 2 puffs or 3 mL neb every 6 hours as needed for shortness of breath or wheezing. Notify if symptoms persist despite rescue inhaler/neb use.  Continue Breztri 2 puffs Twice daily. Brush tongue and rinse mouth afterwards Continue flonase nasal spray 2 sprays each nostril daily Continue guaifenesin 1 tab Twice daily as needed for cough/congestion Continue Ipratropium nasal spray 1-2 sprays each nostril Three times a day for nasal congestion/drainage  Continue oxygen 4 lpm for goal >88-90%  We will see if you can get another battery for your pulsed oxygen concentrator. Charge it anytime you are in the car    Restart furosemide 20 mg daily. Take in AM.   Labs today - will likely start you on potassium as well to keep these levels up  You will see your heart doctor's PA next Wednesday at 915 AM.    Follow up as scheduled with Dr. Vassie Loll in May. If symptoms do not improve or worsen, please contact office for sooner follow up or seek emergency care.

## 2023-08-30 ENCOUNTER — Emergency Department (HOSPITAL_COMMUNITY)

## 2023-08-30 ENCOUNTER — Inpatient Hospital Stay (HOSPITAL_COMMUNITY)
Admission: EM | Admit: 2023-08-30 | Discharge: 2023-09-02 | DRG: 287 | Disposition: A | Discharge status: Home Health | Attending: Cardiology | Admitting: Cardiology

## 2023-08-30 ENCOUNTER — Encounter (HOSPITAL_COMMUNITY): Payer: Self-pay

## 2023-08-30 ENCOUNTER — Other Ambulatory Visit: Payer: Self-pay

## 2023-08-30 DIAGNOSIS — K219 Gastro-esophageal reflux disease without esophagitis: Secondary | ICD-10-CM | POA: Diagnosis present

## 2023-08-30 DIAGNOSIS — J439 Emphysema, unspecified: Secondary | ICD-10-CM | POA: Diagnosis present

## 2023-08-30 DIAGNOSIS — Z7901 Long term (current) use of anticoagulants: Secondary | ICD-10-CM

## 2023-08-30 DIAGNOSIS — I428 Other cardiomyopathies: Secondary | ICD-10-CM | POA: Diagnosis not present

## 2023-08-30 DIAGNOSIS — I712 Thoracic aortic aneurysm, without rupture, unspecified: Secondary | ICD-10-CM | POA: Diagnosis present

## 2023-08-30 DIAGNOSIS — J984 Other disorders of lung: Secondary | ICD-10-CM | POA: Diagnosis not present

## 2023-08-30 DIAGNOSIS — I493 Ventricular premature depolarization: Secondary | ICD-10-CM | POA: Diagnosis not present

## 2023-08-30 DIAGNOSIS — I10 Essential (primary) hypertension: Secondary | ICD-10-CM | POA: Diagnosis present

## 2023-08-30 DIAGNOSIS — S00531A Contusion of lip, initial encounter: Secondary | ICD-10-CM | POA: Diagnosis present

## 2023-08-30 DIAGNOSIS — R0989 Other specified symptoms and signs involving the circulatory and respiratory systems: Secondary | ICD-10-CM | POA: Diagnosis not present

## 2023-08-30 DIAGNOSIS — W1811XA Fall from or off toilet without subsequent striking against object, initial encounter: Secondary | ICD-10-CM | POA: Diagnosis present

## 2023-08-30 DIAGNOSIS — E785 Hyperlipidemia, unspecified: Secondary | ICD-10-CM | POA: Diagnosis not present

## 2023-08-30 DIAGNOSIS — E8809 Other disorders of plasma-protein metabolism, not elsewhere classified: Secondary | ICD-10-CM | POA: Diagnosis not present

## 2023-08-30 DIAGNOSIS — R0789 Other chest pain: Secondary | ICD-10-CM | POA: Diagnosis not present

## 2023-08-30 DIAGNOSIS — I959 Hypotension, unspecified: Secondary | ICD-10-CM | POA: Diagnosis not present

## 2023-08-30 DIAGNOSIS — W19XXXA Unspecified fall, initial encounter: Secondary | ICD-10-CM | POA: Diagnosis present

## 2023-08-30 DIAGNOSIS — I5022 Chronic systolic (congestive) heart failure: Secondary | ICD-10-CM | POA: Diagnosis present

## 2023-08-30 DIAGNOSIS — R55 Syncope and collapse: Secondary | ICD-10-CM | POA: Diagnosis present

## 2023-08-30 DIAGNOSIS — R911 Solitary pulmonary nodule: Secondary | ICD-10-CM | POA: Diagnosis present

## 2023-08-30 DIAGNOSIS — I447 Left bundle-branch block, unspecified: Secondary | ICD-10-CM | POA: Diagnosis present

## 2023-08-30 DIAGNOSIS — I110 Hypertensive heart disease with heart failure: Secondary | ICD-10-CM | POA: Diagnosis present

## 2023-08-30 DIAGNOSIS — I5043 Acute on chronic combined systolic (congestive) and diastolic (congestive) heart failure: Secondary | ICD-10-CM | POA: Diagnosis not present

## 2023-08-30 DIAGNOSIS — D696 Thrombocytopenia, unspecified: Secondary | ICD-10-CM | POA: Diagnosis not present

## 2023-08-30 DIAGNOSIS — J9611 Chronic respiratory failure with hypoxia: Secondary | ICD-10-CM | POA: Diagnosis present

## 2023-08-30 DIAGNOSIS — R079 Chest pain, unspecified: Principal | ICD-10-CM | POA: Diagnosis present

## 2023-08-30 DIAGNOSIS — D649 Anemia, unspecified: Secondary | ICD-10-CM | POA: Diagnosis not present

## 2023-08-30 DIAGNOSIS — N179 Acute kidney failure, unspecified: Secondary | ICD-10-CM | POA: Diagnosis not present

## 2023-08-30 DIAGNOSIS — S40021A Contusion of right upper arm, initial encounter: Secondary | ICD-10-CM | POA: Diagnosis not present

## 2023-08-30 DIAGNOSIS — I499 Cardiac arrhythmia, unspecified: Secondary | ICD-10-CM | POA: Diagnosis not present

## 2023-08-30 DIAGNOSIS — F32A Depression, unspecified: Secondary | ICD-10-CM | POA: Diagnosis not present

## 2023-08-30 DIAGNOSIS — I351 Nonrheumatic aortic (valve) insufficiency: Secondary | ICD-10-CM | POA: Diagnosis present

## 2023-08-30 DIAGNOSIS — I2510 Atherosclerotic heart disease of native coronary artery without angina pectoris: Secondary | ICD-10-CM | POA: Diagnosis present

## 2023-08-30 DIAGNOSIS — R072 Precordial pain: Secondary | ICD-10-CM | POA: Diagnosis not present

## 2023-08-30 DIAGNOSIS — R918 Other nonspecific abnormal finding of lung field: Secondary | ICD-10-CM | POA: Diagnosis not present

## 2023-08-30 DIAGNOSIS — I7121 Aneurysm of the ascending aorta, without rupture: Secondary | ICD-10-CM | POA: Diagnosis present

## 2023-08-30 DIAGNOSIS — Y92230 Patient room in hospital as the place of occurrence of the external cause: Secondary | ICD-10-CM | POA: Diagnosis not present

## 2023-08-30 DIAGNOSIS — I480 Paroxysmal atrial fibrillation: Secondary | ICD-10-CM | POA: Diagnosis not present

## 2023-08-30 DIAGNOSIS — J449 Chronic obstructive pulmonary disease, unspecified: Secondary | ICD-10-CM | POA: Diagnosis present

## 2023-08-30 DIAGNOSIS — Z8249 Family history of ischemic heart disease and other diseases of the circulatory system: Secondary | ICD-10-CM

## 2023-08-30 DIAGNOSIS — Z8673 Personal history of transient ischemic attack (TIA), and cerebral infarction without residual deficits: Secondary | ICD-10-CM

## 2023-08-30 DIAGNOSIS — I5042 Chronic combined systolic (congestive) and diastolic (congestive) heart failure: Secondary | ICD-10-CM | POA: Diagnosis not present

## 2023-08-30 DIAGNOSIS — Z79899 Other long term (current) drug therapy: Secondary | ICD-10-CM

## 2023-08-30 DIAGNOSIS — Y848 Other medical procedures as the cause of abnormal reaction of the patient, or of later complication, without mention of misadventure at the time of the procedure: Secondary | ICD-10-CM | POA: Diagnosis present

## 2023-08-30 DIAGNOSIS — Z853 Personal history of malignant neoplasm of breast: Secondary | ICD-10-CM

## 2023-08-30 DIAGNOSIS — Z9221 Personal history of antineoplastic chemotherapy: Secondary | ICD-10-CM

## 2023-08-30 DIAGNOSIS — Z923 Personal history of irradiation: Secondary | ICD-10-CM

## 2023-08-30 DIAGNOSIS — Z87891 Personal history of nicotine dependence: Secondary | ICD-10-CM

## 2023-08-30 DIAGNOSIS — I700 Atherosclerosis of aorta: Secondary | ICD-10-CM | POA: Diagnosis not present

## 2023-08-30 DIAGNOSIS — Z9981 Dependence on supplemental oxygen: Secondary | ICD-10-CM

## 2023-08-30 DIAGNOSIS — Z952 Presence of prosthetic heart valve: Secondary | ICD-10-CM

## 2023-08-30 LAB — HEPATIC FUNCTION PANEL
ALT: 15 U/L (ref 0–44)
AST: 15 U/L (ref 15–41)
Albumin: 3.4 g/dL — ABNORMAL LOW (ref 3.5–5.0)
Alkaline Phosphatase: 20 U/L — ABNORMAL LOW (ref 38–126)
Bilirubin, Direct: 0.1 mg/dL (ref 0.0–0.2)
Indirect Bilirubin: 0.7 mg/dL (ref 0.3–0.9)
Total Bilirubin: 0.8 mg/dL (ref 0.0–1.2)
Total Protein: 5.4 g/dL — ABNORMAL LOW (ref 6.5–8.1)

## 2023-08-30 LAB — CBC
HCT: 35.9 % — ABNORMAL LOW (ref 36.0–46.0)
Hemoglobin: 11.8 g/dL — ABNORMAL LOW (ref 12.0–15.0)
MCH: 32.6 pg (ref 26.0–34.0)
MCHC: 32.9 g/dL (ref 30.0–36.0)
MCV: 99.2 fL (ref 80.0–100.0)
Platelets: 180 10*3/uL (ref 150–400)
RBC: 3.62 MIL/uL — ABNORMAL LOW (ref 3.87–5.11)
RDW: 14.2 % (ref 11.5–15.5)
WBC: 7.6 10*3/uL (ref 4.0–10.5)
nRBC: 0 % (ref 0.0–0.2)

## 2023-08-30 LAB — TROPONIN I (HIGH SENSITIVITY)
Troponin I (High Sensitivity): 15 ng/L (ref <18)
Troponin I (High Sensitivity): 15 ng/L (ref <18)

## 2023-08-30 LAB — BASIC METABOLIC PANEL
Anion gap: 11 (ref 5–15)
BUN: 33 mg/dL — ABNORMAL HIGH (ref 8–23)
CO2: 26 mmol/L (ref 22–32)
Calcium: 9.5 mg/dL (ref 8.9–10.3)
Chloride: 103 mmol/L (ref 98–111)
Creatinine, Ser: 1.29 mg/dL — ABNORMAL HIGH (ref 0.44–1.00)
GFR, Estimated: 42 mL/min — ABNORMAL LOW (ref >60)
Glucose, Bld: 111 mg/dL — ABNORMAL HIGH (ref 70–99)
Potassium: 4.1 mmol/L (ref 3.5–5.1)
Sodium: 140 mmol/L (ref 135–145)

## 2023-08-30 MED ORDER — HEPARIN (PORCINE) 25000 UT/250ML-% IV SOLN
900.0000 [IU]/h | INTRAVENOUS | Status: DC
  Administered 2023-08-31: 750 [IU]/h via INTRAVENOUS
  Filled 2023-08-30: qty 250

## 2023-08-30 MED ORDER — IPRATROPIUM BROMIDE 0.06 % NA SOLN
2.0000 | Freq: Four times a day (QID) | NASAL | Status: DC
  Administered 2023-08-30 – 2023-09-02 (×6): 2 via NASAL
  Filled 2023-08-30: qty 15

## 2023-08-30 MED ORDER — ALBUTEROL SULFATE HFA 108 (90 BASE) MCG/ACT IN AERS: 1.0000 | INHALATION_SPRAY | RESPIRATORY_TRACT | Status: DC

## 2023-08-30 MED ORDER — UMECLIDINIUM BROMIDE 62.5 MCG/ACT IN AEPB
1.0000 | INHALATION_SPRAY | Freq: Every day | RESPIRATORY_TRACT | Status: DC
  Administered 2023-08-31 – 2023-09-02 (×3): 1 via RESPIRATORY_TRACT
  Filled 2023-08-30: qty 7

## 2023-08-30 MED ORDER — ONDANSETRON HCL 4 MG/2ML IJ SOLN: 4.0000 mg | Freq: Four times a day (QID) | INTRAMUSCULAR | Status: DC | PRN

## 2023-08-30 MED ORDER — BISOPROLOL FUMARATE 5 MG PO TABS
5.0000 mg | ORAL_TABLET | Freq: Every day | ORAL | Status: DC
  Administered 2023-08-31: 5 mg via ORAL
  Filled 2023-08-30: qty 1

## 2023-08-30 MED ORDER — EZETIMIBE 10 MG PO TABS
10.0000 mg | ORAL_TABLET | Freq: Every day | ORAL | Status: DC
  Administered 2023-08-30 – 2023-09-02 (×4): 10 mg via ORAL
  Filled 2023-08-30 (×4): qty 1

## 2023-08-30 MED ORDER — FERROUS SULFATE 325 (65 FE) MG PO TABS
325.0000 mg | ORAL_TABLET | ORAL | Status: DC
  Administered 2023-08-30 – 2023-09-01 (×2): 325 mg via ORAL
  Filled 2023-08-30 (×2): qty 1

## 2023-08-30 MED ORDER — MELATONIN 3 MG PO TABS
3.0000 mg | ORAL_TABLET | Freq: Every day | ORAL | Status: DC
  Administered 2023-08-31 – 2023-09-01 (×3): 3 mg via ORAL
  Filled 2023-08-30 (×3): qty 1

## 2023-08-30 MED ORDER — SERTRALINE HCL 100 MG PO TABS
100.0000 mg | ORAL_TABLET | Freq: Two times a day (BID) | ORAL | Status: DC
  Administered 2023-08-30 – 2023-09-02 (×6): 100 mg via ORAL
  Filled 2023-08-30 (×6): qty 1

## 2023-08-30 MED ORDER — ATORVASTATIN CALCIUM 10 MG PO TABS
20.0000 mg | ORAL_TABLET | Freq: Every day | ORAL | Status: DC
  Administered 2023-08-30 – 2023-09-02 (×4): 20 mg via ORAL
  Filled 2023-08-30 (×4): qty 2

## 2023-08-30 MED ORDER — CARVEDILOL 3.125 MG PO TABS
6.2500 mg | ORAL_TABLET | ORAL | Status: AC
  Administered 2023-08-30: 6.25 mg via ORAL
  Filled 2023-08-30: qty 2

## 2023-08-30 MED ORDER — ACETAMINOPHEN 325 MG PO TABS: 650.0000 mg | ORAL_TABLET | ORAL | Status: DC | PRN

## 2023-08-30 MED ORDER — APIXABAN 2.5 MG PO TABS
2.5000 mg | ORAL_TABLET | ORAL | Status: AC
  Administered 2023-08-30: 2.5 mg via ORAL
  Filled 2023-08-30: qty 1

## 2023-08-30 MED ORDER — FLUTICASONE PROPIONATE 50 MCG/ACT NA SUSP
2.0000 | Freq: Every day | NASAL | Status: DC
  Administered 2023-08-30 – 2023-09-02 (×4): 2 via NASAL
  Filled 2023-08-30 (×2): qty 16

## 2023-08-30 MED ORDER — ALBUTEROL SULFATE (2.5 MG/3ML) 0.083% IN NEBU: 2.5000 mg | INHALATION_SOLUTION | Freq: Four times a day (QID) | RESPIRATORY_TRACT | Status: DC | PRN

## 2023-08-30 MED ORDER — FLUTICASONE FUROATE-VILANTEROL 100-25 MCG/ACT IN AEPB
1.0000 | INHALATION_SPRAY | Freq: Every day | RESPIRATORY_TRACT | Status: DC
  Administered 2023-08-31 – 2023-09-02 (×3): 1 via RESPIRATORY_TRACT
  Filled 2023-08-30: qty 28

## 2023-08-30 MED ORDER — NITROGLYCERIN 0.4 MG SL SUBL: 0.4000 mg | SUBLINGUAL_TABLET | SUBLINGUAL | Status: DC | PRN

## 2023-08-30 MED ORDER — LOSARTAN POTASSIUM 25 MG PO TABS
25.0000 mg | ORAL_TABLET | Freq: Every day | ORAL | Status: DC
  Administered 2023-08-30 – 2023-08-31 (×2): 25 mg via ORAL
  Filled 2023-08-30 (×2): qty 1

## 2023-08-30 MED ORDER — BUDESON-GLYCOPYRROL-FORMOTEROL 160-9-4.8 MCG/ACT IN AERO
2.0000 | INHALATION_SPRAY | Freq: Two times a day (BID) | RESPIRATORY_TRACT | Status: DC
  Filled 2023-08-30: qty 0.42

## 2023-08-30 NOTE — Progress Notes (Signed)
 PHARMACY - ANTICOAGULATION CONSULT NOTE  Pharmacy Consult for heparin  Indication: atrial fibrillation  No Known Allergies  Patient Measurements:   Heparin Dosing Weight: 56.2kg   Vital Signs: Temp: 97.4 F (36.3 C) (03/10 1818) Temp Source: Oral (03/10 1818) BP: 110/58 (03/10 1900) Pulse Rate: 80 (03/10 1900)  Labs: Recent Labs    08/30/23 0928 08/30/23 1103  HGB 11.8*  --   HCT 35.9*  --   PLT 180  --   CREATININE 1.29*  --   TROPONINIHS 15 15    Estimated Creatinine Clearance: 27.1 mL/min (A) (by C-G formula based on SCr of 1.29 mg/dL (H)).   Medical History: Past Medical History:  Diagnosis Date   Anemia    "several times over the years" (10/17/2012)   Anxiety    Breast cancer (HCC) 02/2010   right breast   CAD (coronary artery disease)    a. LHC 12/2018: mild non-obstructive CAD   Carcinoma of breast treated with adjuvant hormone therapy (HCC) 02/2010   Femara   Chronic systolic CHF    a. Echo in 2015: LVEF of 35-40%, b. 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 Hx of afib on Eliquis PTA, last dose today at 15:00. Hgb 11.8 and PLTs 180. Potential for LHC and RHC tomorrow pending scheduled availability, pharmacy consulted to dose heparin infusion.   Goal of Therapy:  Heparin level 0.3-0.7 units/ml aPTT 66-102 Monitor platelets by anticoagulation protocol: Yes    Plan:  No bolus with recent DOAC intake.  Start heparin infusion at 750 units/hr, will start on 3/11 at 03:00.  Check anti-Xa level in 8 hours and daily while on heparin, will check aPTT for initial levels.  Continue to monitor H&H and platelets  Estill Batten, PharmD, BCCCP  08/30/2023,7:24 PM

## 2023-08-30 NOTE — Consult Note (Deleted)
 See H& P.

## 2023-08-30 NOTE — Progress Notes (Signed)
 Briefly, patient is an 83 year old lady with history of mild nonobstructive CAD, severe AI status post AVR in 04/2019, nonischemic cardiomyopathy, heart failure with recovered EF, paroxysmal atrial fibrillation, ascending aortic aneurysm, CVA in 2023, COPD, hypertension, hyperlipidemia, breast cancer status postchemotherapy/radiation/lumpectomy in 2013 who came in for chest pain.  Discussed with cardiology in regards to this patient.  Patient's vital signs are stable. BMP with mild AKI. Hepatic function with mild hypoalbuminemia. Troponins flat at 15.  Plan for right/left heart cath and coronary angiography tomorrow with cardiology.  Currently, patient does not have any active medical issues that require medicine admission.  Discussed with cardiology who will admit patient to their service.  Informed them to reach out to medicine should patient develop any active medical conditions and we will be happy to help.  Merrilyn Puma, MD 08/30/2023, 7:15 PM  How to contact the [facility] Attending or Consulting provider 7A - 7P or covering provider during after hours 7P -7A, for this patient?  Check the care team in [facility] and look for a) attending/consulting [facility] provider listed and b) the [facility] team listed Log into [email or web address] and use [facility] universal password to access. If you do not have the password, please contact the hospital operator. Locate the [facility] provider you are looking for under [facility] and page to a number that you can be directly reached. If you still have difficulty reaching the provider, please page the [facility] (Director on Call) for the Hospitalists listed on amion for assistance.

## 2023-08-30 NOTE — ED Provider Triage Note (Signed)
 Emergency Medicine Provider Triage Evaluation Note  Dana Pruitt , a 83 y.o. female  was evaluated in triage.  Pt complains of episode of back pain across the top of her shoulders in the middle of the night.  She awakened with a severe chest pain going down the right side of her chest from about her neck and shoulder to her mid chest.  Patient reports at the time of the pain it was very severe.  Currently not having pain.  Review of Systems  Positive: Chest pain Negative: Fever  Physical Exam  BP 112/82   Pulse 70   Temp 98.5 F (36.9 C)   Resp 17   LMP  (LMP Unknown)   SpO2 96%  Gen:   Awake, no distress   Resp:  Normal effort lungs clear Heart: Regular no gross rub murmur gallop MSK:   Moves extremities without difficulty  Other:  Mental status clear with normal speech and normal neurologic exam grossly  Medical Decision Making  Medically screening exam initiated at 9:37 AM.  Appropriate orders placed.  ARLEY GARANT was informed that the remainder of the evaluation will be completed by another provider, this initial triage assessment does not replace that evaluation, and the importance of remaining in the ED until their evaluation is complete.  EKG reviewed by myself is similar to previous but shows increased anterior elevation and increase lateral ST depression from prior tracing.  Patient is not having chest pain currently but has high risk for ACS with classic pain in the morning and night and ischemic appearing EKG.  Will need to go directly to room.   Arby Barrette, MD 08/30/23 [phone]

## 2023-08-30 NOTE — ED Triage Notes (Addendum)
 Pt arrives via EMS from home. PT reports chest pain and back pain for the past 48 hours. EMS administered 324mg  of aspirin and 1 sl nitroglycerin which relieved the pain. Denies any other associated symptoms. Pt is on 2lnc which is her baseline.

## 2023-08-30 NOTE — H&P (Addendum)
 Cardiology H and P   Patient ID: Dana Pruitt MRN: [id number]; DOB: 1941/01/08  Admit date: 08/30/2023 Date of Consult: 08/30/2023  PCP:  Merri Brunette, MD   [facility] Providers Cardiologist:  Nanetta Batty, MD   Patient Profile:   Dana Pruitt is a 83 y.o. female with a hx of mild nonobstructive CAD on heart cath 12/2018, severe AI status post AVR 04/2019, nonischemic cardiomyopathy, heart failure with recovered EF , paroxysmal atrial fibrillation, ascending aortic aneurysm, CVA 2023, COPD followed by pulmonology, hypertension, hyperlipidemia, breast cancer status postchemotherapy/radiation and lumpectomy in 2013, who is being seen 08/30/2023 for the evaluation of chest pain at the request of Dr. Rosalia Hammers.  History of Present Illness:   Ms. Downie had EF as low as 35 to 40% in 2015 with moderate AI.  Started on BB and EF had normalized January 2018.  AI continued to progress, echocardiogram again reduced 40 to 45% April 2020.  July 2020 admitted for chest pain underwent right left heart catheterization demonstrating mild nonobstructive CAD with wide aortic pulse pressure suggestive of significant AI, preserved cardiac output and low filling pressures.  Underwent AVR August 2020.  Postoperatively he developed A-fib and continued to have recurrences of this.  EF normalized again in November 2020.  Myoview ordered in February 2022 for left shoulder pain, low risk.  Coronary CTA done July 2023 with overall mild 25-49% plaque in RCA, LAD and LCx, FFR negative.  She is also followed by pulmonology for her COPD, reportedly had increasing oxygen requirements and worsening DOE, weight gain with elevated BNP so felt to be in CHF exacerbation and treated with short course of Lasix with improvement.  Most recent echocardiogram 3/6 showing reduced EF 30 to 35% with global hypokinesis, she was post to follow-up with [place] outpatient but is here in the ER.  Today patient reports that last night  she had an episode of chest pain that woke her up from her sleep.  Reports that during the day she had back pain with no precipitating cause/injury.  She reports waking up to severe chest pain with radiation down her right neck shoulder and abdomen.  Eventually was given nitroglycerin by EMS with improvement, given loading dose of aspirin.  Not having chest pain now.  Did not have any nausea, diaphoresis during episode.  Also does not report any significant peripheral edema.  She reports ongoing shortness of breath for the last 6 months or so.  Reports being very functional and able to ambulate 2 miles and not with any significant fatigue.  Reports no exertional chest pain.  Reports having prior episodes of this back pain/chest pain though while sleeping.  Chest x-ray with emphysema, no active disease.  Troponins negative x 2.  Potassium 4.1.  Creatinine 1.29.  Hemoglobin 11.8.  BNP outpatient 473.    Past Medical History:  Diagnosis Date   Anemia    "several times over the years" (10/17/2012)   Anxiety    Breast cancer (HCC) 02/2010   right breast   CAD (coronary artery disease)    a. LHC 12/2018: mild non-obstructive CAD   Carcinoma of breast treated with adjuvant hormone therapy (HCC) 02/2010   Femara   Chronic systolic CHF    a. Echo in 2015: LVEF of 35-40%, b. 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 SN: [id number];  Surgeon: Donata Clay, Theron Arista, MD;  Location: [facility] OR;  Service: Open Heart Surgery;  Laterality: N/A;   APPENDECTOMY  1974   BREAST BIOPSY Right 02/27/10   Needle core Biopsy; Invasive Mammary; ER/PR Positive, Her-2 Neu negative, Ki-67 22%   BREAST LUMPECTOMY WITH SENTINEL LYMPH NODE BIOPSY Right 07/03/11   Invasive Ductal Carcinoma;0/3 nodes negative,; ER,PR Positive, Her-2 Neg; Ki-67 22%   CESAREAN SECTION  1970; 1974   COLONOSCOPY     DILATION AND CURETTAGE OF UTERUS  1970's   RIGHT/LEFT HEART CATH AND CORONARY ANGIOGRAPHY N/A 01/02/2019   Procedure: RIGHT/LEFT HEART CATH AND CORONARY ANGIOGRAPHY;  Surgeon: Laurey Morale, MD;  Location: [facility] INVASIVE CV LAB;  Service: Cardiovascular;  Laterality: N/A;   TEE WITHOUT CARDIOVERSION N/A 01/31/2019   Procedure: TRANSESOPHAGEAL ECHOCARDIOGRAM (TEE);  Surgeon: Donata Clay, Theron Arista, MD;  Location: [facility] OR;  Service: Open Heart Surgery;  Laterality: N/A;   TUBAL LIGATION  1974   WISDOM TOOTH EXTRACTION      Inpatient Medications: Scheduled Meds:  [START ON 08/31/2023] bisoprolol  5 mg Oral Daily   Continuous Infusions:  PRN Meds:   Allergies:   No Known Allergies  Social History:   Social History   Socioeconomic History   Marital status: Married    Spouse name: Not on file   Number of children: 2   Years of education: college   Highest education level: Bachelor's degree (e.g., BA, AB, BS)  Occupational History    Employer: RETIRED  Tobacco Use   Smoking status: Former    Current packs/day: 0.00    Average packs/day: 3.0 packs/day for 32.1 years (96.3 ttl pk-yrs)    Types: Cigarettes    Start date: 05/19/1958    Quit date: 06/30/1990    Years since quitting: 33.1   Smokeless tobacco: Never   Tobacco comments:    Stopped smoking for 2-3 years during second child.   Vaping Use   Vaping status: Never Used   Substance and Sexual Activity   Alcohol use: Yes    Comment: 1-2 glasses wine/month   Drug use: No   Sexual activity: Yes    Birth control/protection: Post-menopausal    Comment: Menarche age 54, G71,P2, Menopause in 57"s, No HRT Married  Other Topics Concern   Not on file  Social History Narrative   [facility] (12/29/16):   Originally from [place], [place]. Moved to [place] at 83 y.o. Does have a dog. Hasn't worked outside the home. Remote parakeet exposure from her children. No mold or hot tub exposure. Enjoys doing yard work & walking.    Social Drivers of Corporate investment banker Strain: Not on file  Food Insecurity: Low Risk  (11/05/2022)   [facility]   Hunger Vital Sign    Worried About Running Out of Food in the Last Year: Never  true    Ran Out of Food in the Last Year: Never true  Transportation Needs: No Transportation Needs (11/05/2022)   [facility]   Transportation    In the past 12 months, has lack of reliable transportation kept you from medical appointments, meetings, work or from getting things needed for daily living? : No  Physical Activity: Not on file  Stress: Not on file  Social Connections: Unknown (02/25/2023)   Received from [facility]   Social Network    Social Network: Not on file  Intimate Partner Violence: Unknown (02/25/2023)   Received from [facility]   HITS    Physically Hurt: Not on file    Insult or Talk Down To: Not on file    Threaten Physical Harm: Not on file    Scream or Curse: Not on file    Family History:   Family History  Problem Relation Age of Onset   Heart disease Mother    Cancer Father    Heart disease Maternal Grandmother    Heart disease Maternal Grandfather    Cancer Paternal Grandmother    Lung disease Neg Hx      ROS:  Please see the history of present illness.  All other ROS reviewed and negative.     Physical Exam/Data:   Vitals:   08/30/23  1818 08/30/23 1830 08/30/23 1845 08/30/23 1900  BP: 98/63 101/63 99/68 (!) 110/58  Pulse: 63 73 64 80  Resp: 19 18 20 18   Temp: (!) 97.4 F (36.3 C)     TempSrc: Oral     SpO2: 91% 91% 91% 90%   No intake or output data in the 24 hours ending 08/30/23 1910    08/27/2023   11:38 AM 08/24/2023    1:48 PM 08/16/2023    2:32 PM  Last 3 Weights  Weight (lbs) 124 lb 122 lb 2.2 oz 128 lb 4.8 oz  Weight (kg) 56.246 kg 55.4 kg 58.196 kg     There is no height or weight on file to calculate BMI.  General:  Well nourished, well developed, in no acute distress.  On supplemental oxygen HEENT: normal Neck: no JVD Vascular: No carotid bruits; Distal pulses 2+ bilaterally Cardiac:  normal S1, S2; RRR; no murmur  Lungs:  clear to auscultation bilaterally, no wheezing, rhonchi or rales  Abd: soft, nontender, no hepatomegaly  Ext: no edema Musculoskeletal:  No deformities, BUE and BLE strength normal and equal Skin: warm and dry  Neuro:  CNs 2-12 intact, no focal abnormalities noted Psych:  Normal affect   EKG:  The EKG was personally reviewed and demonstrates: Sinus rhythm, heart rate 70.  LBBB.  ST elevation in anterior leads, slightly more prominent in V3 with more acute T wave.   Telemetry:  Telemetry was personally reviewed and demonstrates: Sinus rhythm heart rates in the 70s  Relevant CV Studies: Echocardiogram 07/29/2023 1. Left ventricular ejection fraction, by estimation, is 30 to 35%. The  left ventricle has moderately decreased function. The left ventricle  demonstrates global hypokinesis with prominent septal-lateral dyssynchrony  due to LBBB. There is mild concentric   left ventricular hypertrophy. Left ventricular diastolic parameters are  consistent with Grade I diastolic dysfunction (impaired relaxation).   2. Right ventricular systolic function is mildly reduced. The right  ventricular size is normal. There is normal pulmonary artery systolic  pressure. The estimated right  ventricular systolic pressure is 35.9 mmHg.   3. Left atrial size was  mildly dilated.   4. The mitral valve is normal in structure. Mild mitral valve  regurgitation. No evidence of mitral stenosis.   5. Bioprosthetic aortic valve, normal function. Mean gradient 12 mmHg, no  perivalvular leakage.   6. Aortic dilatation noted. There is mild dilatation of the ascending  aorta, measuring 43 mm.   7. The inferior vena cava is normal in size with greater than 50%  respiratory variability, suggesting right atrial pressure of 3 mmHg.    Laboratory Data:  High Sensitivity Troponin:   Recent Labs  Lab 08/30/23 0928 08/30/23 1103  TROPONINIHS 15 15     Chemistry Recent Labs  Lab 08/27/23 1334 08/30/23 0928  NA 138 140  K 4.5 4.1  CL 103 103  CO2 25 26  GLUCOSE 191* 111*  BUN 32* 33*  CREATININE 1.08 1.29*  CALCIUM 9.8 9.5  GFRNONAA  --  42*  ANIONGAP  --  11    Recent Labs  Lab 08/30/23 1000  PROT 5.4*  ALBUMIN 3.4*  AST 15  ALT 15  ALKPHOS 20*  BILITOT 0.8   Lipids No results for input(s): "CHOL", "TRIG", "HDL", "LABVLDL", "LDLCALC", "CHOLHDL" in the last 168 hours.  Hematology Recent Labs  Lab 08/30/23 0928  WBC 7.6  RBC 3.62*  HGB 11.8*  HCT 35.9*  MCV 99.2  MCH 32.6  MCHC 32.9  RDW 14.2  PLT 180   Thyroid No results for input(s): "TSH", "FREET4" in the last 168 hours.  BNPNo results for input(s): "BNP", "PROBNP" in the last 168 hours.  DDimer No results for input(s): "DDIMER" in the last 168 hours.   Radiology/Studies:  DG Chest Port 1 View Result Date: 08/30/2023 CLINICAL DATA:  Chest pain EXAM: PORTABLE CHEST 1 VIEW COMPARISON:  08/16/2023 FINDINGS: Artifact overlies the chest. Previous median sternotomy and aortic valve replacement. Heart size is normal. Aortic atherosclerotic calcification is seen. There are emphysematous changes of the upper lungs. Scattered areas of pulmonary scarring but no sign of active consolidation, collapse or effusion.  Chronic degenerative changes affect the shoulders. IMPRESSION: Previous median sternotomy and aortic valve replacement. Emphysematous changes of the upper lungs. Scattered areas of pulmonary scarring. No active disease. Electronically Signed   By: Paulina Fusi M.D.   On: 08/30/2023 11:21     Assessment and Plan:   Chest pain Nonobstructive CAD Currently patient reports episode of chest pain/back pain that woke her up from her sleep with radiation down her right jaw to her abdomen, relieved with nitroglycerin.  EKG showing more increased ST elevation in anterior leads, most notable in V3 with more acute T wave with underlying LBBB.  Troponins negative x 2.  She has had multiple studies now demonstrating mild nonobstructive disease with the last being a CCTA in 2023 with neg FFR.  Given her current symptoms and shortness of breath that pulmonology feels not driven by her COPD (could be anginal equivalent) and again drop in EF I think cardiac catheterization is warranted.  We will attempt to plan for right and left heart catheterization tomorrow if schedule permits, very overbooked at the time being. Discussed with MD, no need to start heparin, not having active pain. Hold Eliquis. Already given aspirin, continue BB Check lipid panel/LP(a), continue atorvastatin 20 mg, Zetia Will make NPO tomorrow for the time being, until cath timing can be figured out.  Heart failure with recovered EF Nonischemic cardiomyopathy Initially had EF as low as 35 to 40% in 2015, had normalized January 2018, given reduced  40 to 45% 2020.  EF later normalized post TAVR.  Now has dropped again 30 to 35% with global hypokinesis, septal lateral dyssynchrony with LBBB.  Normal RVSP.  Looks euvolemic. Right left heart catheterization as above. GDMT: Currently on Coreg, switch to bisoprolol 5 mg daily with underlying COPD start tomorrow.  Further titration can be done postcardiac catheterization.  PAF Maintaining sinus  here. Holding Eliquis in anticipation of cardiac catheterization, resume at interventionalists discretion. Start heparin per pharmacy.  Continue Bisoprolol as above  Severe AI status post AVR in 2020 Normal device function on echocardiogram.  Mean gradient 12.  No perivalvular leaks  Aortic dilatation 43 mm.  Continue to monitor on serial echocardiograms.  Pulmonary nodule COPD chronically on oxygen Following with pulmonology   Informed Consent   Shared Decision Making/Informed Consent  The risks [stroke (1 in 1000), death (1 in 1000), kidney failure [usually temporary] (1 in 500), bleeding (1 in 200), allergic reaction [possibly serious] (1 in 200)], benefits (diagnostic support and management of coronary artery disease) and alternatives of a cardiac catheterization were discussed in detail with Ms. Stach and she is willing to proceed.   Risk Assessment/Risk Scores:   TIMI Risk Score for Unstable Angina or Non-ST Elevation MI:   The patient's TIMI risk score is 3, which indicates a 13% risk of all cause mortality, new or recurrent myocardial infarction or need for urgent revascularization in the next 14 days. }  New York Heart Association (NYHA) Functional Class NYHA Class II  CHA2DS2-VASc Score = 6   This indicates a 9.7% annual risk of stroke. The patient's score is based upon: CHF History: 1 HTN History: 1 Diabetes History: 0 Stroke History: 0 Vascular Disease History: 1 Age Score: 2 Gender Score: 1    Code Status: Full Code  Severity of Illness: The appropriate patient status for this patient is OBSERVATION. Observation status is judged to be reasonable and necessary in order to provide the required intensity of service to ensure the patient's safety. The patient's presenting symptoms, physical exam findings, and initial radiographic and laboratory data in the context of their medical condition is felt to place them at decreased risk for further clinical  deterioration. Furthermore, it is anticipated that the patient will be medically stable for discharge from the hospital within 2 midnights of admission.    For questions or updates, please contact [facility] Please consult [email or web address] for contact info under   Signed, Abagail Kitchens, PA-C  08/30/2023 7:10 PM   Patient seen and examined, note reviewed with the signed Advanced Practice Provider. I personally reviewed laboratory data, imaging studies and relevant notes. I independently examined the patient and formulated the important aspects of the plan. I have personally discussed the plan with the patient and/or family. Comments or changes to the note/plan are indicated below.  Patient is seen and examined at her bedside.  Present with chest pain and found to have depressed ejection fraction .   A/p Mild Nonobstructive CAD Status post AVR Nonischemic cardiomyopathy Ascending aortic aneurysm Hx of CVA  PAF  Her symptoms and depressed EF is concerning. I think she will benefit from ischemic evaluation. In this case a cardiac cath will be the best here. A right and left heart cath. Keep npo past midnight. On Eliquis for now Start hep gtt -   continue aspirin and statin.  If her cath in not impressive, then likely her dyssynchrony with LBBB may be contributory.  Plan to optimize GDMT -  agree with switching to Bisoprolol,  Cr 1.29 , slightly elevated will monitor post cath encourage hydration.  Plan to initiate other GDMT post cath to optimize her regimen.    Thomasene Ripple DO, MS [facility] Attending Cardiologist [facility]  [address] [place], [place] [address] [phone] Website: [email or web address]

## 2023-08-30 NOTE — ED Provider Notes (Signed)
 [place] [facility] AT [facility] Provider Note   CSN: [id number] Arrival date & time: 08/30/23  4098     History  Chief Complaint  Patient presents with   Chest Pain    Dana Pruitt is a 83 y.o. female.  HPI 83 year old female history of aortic valve repair, COPD, Zentz today complaining of chest pain.  States she had some back pain over the past several days.  Last night during the night anterior chest pain woke her up.  She states it started in her jaw and radiated down to the front of her chest.  Pain persisted through the night and was relieved and route via EMS with a nitroglycerin.  She also reports receiving aspirin.  She does not think she has had similar pain in the past.  She has been recently started on oxygen.  She reports no change in her respiratory status, abdominal pain, nausea, vomiting, diarrhea.  Her cardiologist is Dr. Gery Pray at Dr. Carolee Rota primary care doctor     Home Medications Prior to Admission medications   Medication Sig Start Date End Date Taking? Authorizing Provider  albuterol (PROAIR HFA) 108 (90 Base) MCG/ACT inhaler 2 puffs every 4 hours as needed only  if your can't catch your breath 09/14/22   Nyoka Cowden, MD  albuterol (PROVENTIL) (2.5 MG/3ML) 0.083% nebulizer solution Take 3 mLs (2.5 mg total) by nebulization every 6 (six) hours as needed for wheezing or shortness of breath. 08/16/23   Cobb, Ruby Cola, NP  atorvastatin (LIPITOR) 20 MG tablet Take 20 mg by mouth daily.  02/12/14   [provider]  BREZTRI AEROSPHERE 160-9-4.8 MCG/ACT AERO INHALE 2 PUFFS INTO THE LUNGS TWICE DAILY 02/02/23   Nyoka Cowden, MD  carvedilol (COREG) 6.25 MG tablet TAKE 1 AND 1/2 TABLETS(9.375 MG) BY MOUTH TWICE DAILY WITH A MEAL 06/25/23   Runell Gess, MD  Cholecalciferol (VITAMIN D3) 25 MCG (1000 UT) CAPS Take 1,000 Units by mouth 2 (two) times daily.  2 capsules a day for 2 weeks and then 1 daily Orally Once a day for 30 days  02/10/23   [provider]  denosumab (PROLIA) 60 MG/ML SOSY injection Inject 60 mg into the skin every 6 (six) months.    [provider]  diltiazem (CARDIZEM) 30 MG tablet Take 1 tablet every 4 hours AS NEEDED for AFIB heart rate >100 02/21/20   Newman Nip, NP  ELIQUIS 2.5 MG TABS tablet TAKE 1 TABLET(2.5 MG) BY MOUTH TWICE DAILY 01/28/23   Runell Gess, MD  ezetimibe (ZETIA) 10 MG tablet Take 10 mg by mouth daily.    [provider]  FEROSUL 325 (65 Fe) MG tablet Take 325 mg by mouth every other day. 05/08/23   [provider]  fluticasone (FLONASE) 50 MCG/ACT nasal spray SHAKE LIQUID AND USE 2 SPRAYS IN EACH NOSTRIL DAILY 03/18/23   Nyoka Cowden, MD  furosemide (LASIX) 20 MG tablet Take 1 tablet (20 mg total) by mouth daily. 08/27/23   Cobb, Ruby Cola, NP  guaiFENesin (MUCINEX) 600 MG 12 hr tablet Take 1 tablet (600 mg total) by mouth 2 (two) times daily. 02/07/19   Gold, Wayne E, PA-C  ipratropium (ATROVENT) 0.06 % nasal spray Place 2 sprays into both nostrils 4 (four) times daily. 08/16/23   Cobb, Ruby Cola, NP  losartan (COZAAR) 25 MG tablet Take 25 mg by mouth daily.    [provider]  nitrofurantoin (MACRODANTIN) 100 MG  capsule Take 100 mg by mouth at bedtime. 06/24/23   [provider]  potassium chloride SA (KLOR-CON M) 10 MEQ tablet Take 1 tablet (10 mEq total) by mouth daily. 08/27/23   Cobb, Ruby Cola, NP  predniSONE (DELTASONE) 10 MG tablet 4 tabs for 3 days, then 3 tabs for 3 days, 2 tabs for 3 days, then 1 tab for 3 days, then stop 08/16/23   Cobb, Ruby Cola, NP  sertraline (ZOLOFT) 100 MG tablet Take 100 mg by mouth 2 (two) times daily.    [provider]  valACYclovir (VALTREX) 1000 MG tablet Take 500 mg by mouth as needed.    [provider]      Allergies    Patient has no known allergies.    Review of Systems   Review of Systems  Physical Exam Updated Vital Signs BP 134/81   Pulse 61    Temp 97.7 F (36.5 C) (Oral)   Resp (!) 24   LMP  (LMP Unknown)   SpO2 92%  Physical Exam Vitals reviewed.  Constitutional:      General: She is not in acute distress.    Appearance: She is well-developed.  Eyes:     Pupils: Pupils are equal, round, and reactive to light.  Cardiovascular:     Rate and Rhythm: Normal rate and regular rhythm.     Heart sounds: Normal heart sounds.  Pulmonary:     Effort: Pulmonary effort is normal.     Breath sounds: Normal breath sounds.  Abdominal:     General: Bowel sounds are normal.     Palpations: Abdomen is soft.  Musculoskeletal:        General: Normal range of motion.     Cervical back: Normal range of motion.     Right lower leg: No tenderness. No edema.     Left lower leg: No tenderness. No edema.  Skin:    General: Skin is warm and dry.     Capillary Refill: Capillary refill takes less than 2 seconds.  Neurological:     General: No focal deficit present.     Mental Status: She is alert.     ED Results / Procedures / Treatments   Labs (all labs ordered are listed, but only abnormal results are displayed) Labs Reviewed  BASIC METABOLIC PANEL - Abnormal; Notable for the following components:      Result Value   Glucose, Bld 111 (*)    BUN 33 (*)    Creatinine, Ser 1.29 (*)    GFR, Estimated 42 (*)    All other components within normal limits  CBC - Abnormal; Notable for the following components:   RBC 3.62 (*)    Hemoglobin 11.8 (*)    HCT 35.9 (*)    All other components within normal limits  HEPATIC FUNCTION PANEL - Abnormal; Notable for the following components:   Total Protein 5.4 (*)    Albumin 3.4 (*)    Alkaline Phosphatase 20 (*)    All other components within normal limits  TROPONIN I (HIGH SENSITIVITY)  TROPONIN I (HIGH SENSITIVITY)    EKG EKG Interpretation Date/Time:  Monday August 30 2023 09:23:39 EDT Ventricular Rate:  70 PR Interval:  148 QRS Duration:  134 QT Interval:  430 QTC  Calculation: 464 R Axis:   245  Text Interpretation: Normal sinus rhythm Possible Left atrial enlargement Right superior axis deviation Left ventricular hypertrophy with QRS widening and repolarization abnormality ( Cornell product ) Cannot  rule out Septal infarct , age undetermined Abnormal ECG When compared with ECG of 03-Aug-2023 12:06, PREVIOUS ECG IS PRESENT simiolar to previous, incresed anterior elevation and lateral depression Confirmed by Arby Barrette [phone]) on 08/30/2023 9:28:43 AM  Radiology DG Chest Port 1 View Result Date: 08/30/2023 CLINICAL DATA:  Chest pain EXAM: PORTABLE CHEST 1 VIEW COMPARISON:  08/16/2023 FINDINGS: Artifact overlies the chest. Previous median sternotomy and aortic valve replacement. Heart size is normal. Aortic atherosclerotic calcification is seen. There are emphysematous changes of the upper lungs. Scattered areas of pulmonary scarring but no sign of active consolidation, collapse or effusion. Chronic degenerative changes affect the shoulders. IMPRESSION: Previous median sternotomy and aortic valve replacement. Emphysematous changes of the upper lungs. Scattered areas of pulmonary scarring. No active disease. Electronically Signed   By: Paulina Fusi M.D.   On: 08/30/2023 11:21    Procedures Procedures    Medications Ordered in ED Medications  bisoprolol (ZEBETA) tablet 5 mg (has no administration in time range)  apixaban (ELIQUIS) tablet 2.5 mg (2.5 mg Oral Given 08/30/23 1517)  carvedilol (COREG) tablet 6.25 mg (6.25 mg Oral Given 08/30/23 1517)    ED Course/ Medical Decision Making/ A&P Clinical Course as of 08/30/23 1732  Mon Aug 30, 2023  1138 Chest x-Emery Binz reviewed interpreted and no active disease noted on my interpretation and radiologist interpretation concurs [DR]  1400 Troponin and repeat troponin are 15 without change [DR]  1400 Basic metabolic panel is significant for BUN 33 creatinine 1.29  [DR]  1401 BC shows mild anemia hemoglobin 11.8  which is stable from 2 checks ago. [DR]    Clinical Course User Index [DR] Margarita Grizzle, MD                                 Medical Decision Making Amount and/or Complexity of Data Reviewed Labs: ordered. Radiology: ordered.  Risk Prescription drug management.   83 year old female presents today complaining of chest pain. Patient with known history of COPD and history of aortic valve replacement.  Patient has paroxysmal atrial fibrillation and is chronically on Eliquis Differential diagnosis includes but is not limited to: Patient seen and evaluated for chest pain.  Differential diagnosis of serious/life threatening causes of chest pain includes ACS, other diseases of the heart such as myocarditis or pericarditis, lung etiologies such as infection or pneumothorax, diseases of the great vessels such as aortic dissection or AAA, pulmonary embolism, or GI sources such as cholecystitis or other upper abdominal causes. Doubt MI- heart score documented, EKG reviewed, Given the timing of pain to ER presentation, 15 troponin and repeat troponin obtained and WNL Doubt myocarditis/pericarditis/tamponade based on history, review of ekg and labs Doubt aortic dissection based on history and review of imaging Doubt intrinsic lung causes such as pneumonia or pneumothorax, based on history, physical exam, and studies obtained. Doubt PE based on history, physical exam, and PERC Doubt acute GI etiology requiring intervention based on history, physical exam and labs. Patient appears stable for discharge. Return precautions and need for follow up discussed and patient voices understanding  Concern for ACS based on patient's history and symptoms.  Patient is high risk for acute coronary syndrome.  I discussed with Trish and cardiology will see in consultation. Cardiology has seen and they have do plan on cardiac catheterization.       Final Clinical Impression(s) / ED Diagnoses Final diagnoses:   Chest pain, unspecified type  Rx / DC Orders ED Discharge Orders     None         Margarita Grizzle, MD 08/30/23 [phone]

## 2023-08-30 NOTE — Telephone Encounter (Signed)
 Not mention in last office note

## 2023-08-30 NOTE — Consult Note (Deleted)
 Error

## 2023-08-31 ENCOUNTER — Ambulatory Visit (HOSPITAL_COMMUNITY): Admit: 2023-08-31 | Admitting: Cardiology

## 2023-08-31 ENCOUNTER — Encounter (HOSPITAL_COMMUNITY): Payer: Medicare Other

## 2023-08-31 ENCOUNTER — Inpatient Hospital Stay (HOSPITAL_COMMUNITY)
Admission: EM | Disposition: A | Discharge status: Home Health | Payer: Self-pay | Source: Home / Self Care | Attending: Cardiology

## 2023-08-31 ENCOUNTER — Other Ambulatory Visit: Payer: Self-pay

## 2023-08-31 DIAGNOSIS — I5043 Acute on chronic combined systolic (congestive) and diastolic (congestive) heart failure: Secondary | ICD-10-CM | POA: Diagnosis not present

## 2023-08-31 DIAGNOSIS — R0989 Other specified symptoms and signs involving the circulatory and respiratory systems: Secondary | ICD-10-CM | POA: Diagnosis not present

## 2023-08-31 DIAGNOSIS — R072 Precordial pain: Secondary | ICD-10-CM | POA: Diagnosis not present

## 2023-08-31 HISTORY — PX: RIGHT HEART CATH AND CORONARY ANGIOGRAPHY: CATH118264

## 2023-08-31 LAB — LIPID PANEL
Cholesterol: 118 mg/dL (ref 0–200)
HDL: 47 mg/dL (ref >40)
LDL Cholesterol: 47 mg/dL (ref 0–99)
Total CHOL/HDL Ratio: 2.5 ratio
Triglycerides: 118 mg/dL (ref <150)
VLDL: 24 mg/dL (ref 0–40)

## 2023-08-31 LAB — POCT I-STAT EG7
Acid-Base Excess: 2 mmol/L (ref 0.0–2.0)
Bicarbonate: 28.5 mmol/L — ABNORMAL HIGH (ref 20.0–28.0)
Calcium, Ion: 1.28 mmol/L (ref 1.15–1.40)
HCT: 33 % — ABNORMAL LOW (ref 36.0–46.0)
Hemoglobin: 11.2 g/dL — ABNORMAL LOW (ref 12.0–15.0)
O2 Saturation: 60 %
Potassium: 4.2 mmol/L (ref 3.5–5.1)
Sodium: 139 mmol/L (ref 135–145)
TCO2: 30 mmol/L (ref 22–32)
pCO2, Ven: 50.9 mmHg (ref 44–60)
pH, Ven: 7.356 (ref 7.25–7.43)
pO2, Ven: 33 mmHg (ref 32–45)

## 2023-08-31 LAB — POCT I-STAT 7, (LYTES, BLD GAS, ICA,H+H)
Acid-Base Excess: 1 mmol/L (ref 0.0–2.0)
Bicarbonate: 26.3 mmol/L (ref 20.0–28.0)
Calcium, Ion: 1.22 mmol/L (ref 1.15–1.40)
HCT: 33 % — ABNORMAL LOW (ref 36.0–46.0)
Hemoglobin: 11.2 g/dL — ABNORMAL LOW (ref 12.0–15.0)
O2 Saturation: 94 %
Potassium: 4.1 mmol/L (ref 3.5–5.1)
Sodium: 138 mmol/L (ref 135–145)
TCO2: 28 mmol/L (ref 22–32)
pCO2 arterial: 42.1 mmHg (ref 32–48)
pH, Arterial: 7.404 (ref 7.35–7.45)
pO2, Arterial: 71 mmHg — ABNORMAL LOW (ref 83–108)

## 2023-08-31 LAB — BASIC METABOLIC PANEL
Anion gap: 3 — ABNORMAL LOW (ref 5–15)
BUN: 34 mg/dL — ABNORMAL HIGH (ref 8–23)
CO2: 25 mmol/L (ref 22–32)
Calcium: 9 mg/dL (ref 8.9–10.3)
Chloride: 106 mmol/L (ref 98–111)
Creatinine, Ser: 1.12 mg/dL — ABNORMAL HIGH (ref 0.44–1.00)
GFR, Estimated: 49 mL/min — ABNORMAL LOW (ref >60)
Glucose, Bld: 123 mg/dL — ABNORMAL HIGH (ref 70–99)
Potassium: 4.5 mmol/L (ref 3.5–5.1)
Sodium: 134 mmol/L — ABNORMAL LOW (ref 135–145)

## 2023-08-31 LAB — CBC
HCT: 36.4 % (ref 36.0–46.0)
Hemoglobin: 12.2 g/dL (ref 12.0–15.0)
MCH: 32.5 pg (ref 26.0–34.0)
MCHC: 33.5 g/dL (ref 30.0–36.0)
MCV: 97.1 fL (ref 80.0–100.0)
Platelets: 169 10*3/uL (ref 150–400)
RBC: 3.75 MIL/uL — ABNORMAL LOW (ref 3.87–5.11)
RDW: 14.2 % (ref 11.5–15.5)
WBC: 9.8 10*3/uL (ref 4.0–10.5)
nRBC: 0 % (ref 0.0–0.2)

## 2023-08-31 LAB — APTT: aPTT: 49 s — ABNORMAL HIGH (ref 24–36)

## 2023-08-31 SURGERY — RIGHT HEART CATH AND CORONARY ANGIOGRAPHY
Anesthesia: LOCAL

## 2023-08-31 MED ORDER — MIDAZOLAM HCL 2 MG/2ML IJ SOLN
INTRAMUSCULAR | Status: DC | PRN
  Administered 2023-08-31: 1 mg via INTRAVENOUS

## 2023-08-31 MED ORDER — IOHEXOL 350 MG/ML SOLN
INTRAVENOUS | Status: DC | PRN
  Administered 2023-08-31: 12 mL

## 2023-08-31 MED ORDER — HEPARIN (PORCINE) IN NACL 1000-0.9 UT/500ML-% IV SOLN
INTRAVENOUS | Status: DC | PRN
  Administered 2023-08-31 (×2): 500 mL

## 2023-08-31 MED ORDER — VERAPAMIL HCL 2.5 MG/ML IV SOLN
INTRAVENOUS | Status: AC
  Filled 2023-08-31: qty 2

## 2023-08-31 MED ORDER — LIDOCAINE HCL (PF) 1 % IJ SOLN
INTRAMUSCULAR | Status: DC | PRN
  Administered 2023-08-31: 5 mL

## 2023-08-31 MED ORDER — FENTANYL CITRATE (PF) 100 MCG/2ML IJ SOLN
INTRAMUSCULAR | Status: AC
  Filled 2023-08-31: qty 2

## 2023-08-31 MED ORDER — ASPIRIN 81 MG PO TBEC
81.0000 mg | DELAYED_RELEASE_TABLET | Freq: Every day | ORAL | Status: DC
  Administered 2023-08-31 – 2023-09-02 (×3): 81 mg via ORAL
  Filled 2023-08-31 (×3): qty 1

## 2023-08-31 MED ORDER — ASPIRIN 81 MG PO CHEW: 81.0000 mg | CHEWABLE_TABLET | ORAL | Status: DC

## 2023-08-31 MED ORDER — HEPARIN SODIUM (PORCINE) 1000 UNIT/ML IJ SOLN
INTRAMUSCULAR | Status: AC
  Filled 2023-08-31: qty 10

## 2023-08-31 MED ORDER — VERAPAMIL HCL 2.5 MG/ML IV SOLN
INTRAVENOUS | Status: AC
Start: 2023-08-31 — End: ?
  Filled 2023-08-31: qty 2

## 2023-08-31 MED ORDER — SODIUM CHLORIDE 0.9 % IV SOLN: 250.0000 mL | INTRAVENOUS | Status: AC | PRN

## 2023-08-31 MED ORDER — SODIUM CHLORIDE 0.9% FLUSH: 3.0000 mL | INTRAVENOUS | Status: DC | PRN

## 2023-08-31 MED ORDER — FENTANYL CITRATE (PF) 100 MCG/2ML IJ SOLN
INTRAMUSCULAR | Status: DC | PRN
  Administered 2023-08-31: 25 ug via INTRAVENOUS

## 2023-08-31 MED ORDER — LABETALOL HCL 5 MG/ML IV SOLN: 10.0000 mg | INTRAVENOUS | Status: AC | PRN

## 2023-08-31 MED ORDER — MIDAZOLAM HCL 2 MG/2ML IJ SOLN
INTRAMUSCULAR | Status: AC
  Filled 2023-08-31: qty 2

## 2023-08-31 MED ORDER — LIDOCAINE HCL (PF) 1 % IJ SOLN
INTRAMUSCULAR | Status: AC
  Filled 2023-08-31: qty 30

## 2023-08-31 MED ORDER — APIXABAN 2.5 MG PO TABS
2.5000 mg | ORAL_TABLET | Freq: Two times a day (BID) | ORAL | Status: DC
  Administered 2023-09-01 – 2023-09-02 (×3): 2.5 mg via ORAL
  Filled 2023-08-31 (×3): qty 1

## 2023-08-31 MED ORDER — HEPARIN SODIUM (PORCINE) 1000 UNIT/ML IJ SOLN
INTRAMUSCULAR | Status: DC | PRN
  Administered 2023-08-31: 3000 [IU] via INTRAVENOUS

## 2023-08-31 MED ORDER — VERAPAMIL HCL 2.5 MG/ML IV SOLN
INTRAVENOUS | Status: DC | PRN
  Administered 2023-08-31: 10 mL via INTRA_ARTERIAL

## 2023-08-31 MED ORDER — HYDRALAZINE HCL 20 MG/ML IJ SOLN: 10.0000 mg | INTRAMUSCULAR | Status: AC | PRN

## 2023-08-31 MED ORDER — SODIUM CHLORIDE 0.9 % IV SOLN: INTRAVENOUS | Status: DC

## 2023-08-31 MED ORDER — SODIUM CHLORIDE 0.9% FLUSH
3.0000 mL | Freq: Two times a day (BID) | INTRAVENOUS | Status: DC
  Administered 2023-09-01 – 2023-09-02 (×2): 3 mL via INTRAVENOUS

## 2023-08-31 SURGICAL SUPPLY — 14 items
CATH 5FR JL3.5 JR4 ANG PIG MP (CATHETERS) IMPLANT
CATH BALLN WEDGE 5F 110CM (CATHETERS) IMPLANT
DEVICE RAD TR BAND REGULAR (VASCULAR PRODUCTS) IMPLANT
ELECT DEFIB PAD ADLT CADENCE (PAD) IMPLANT
GLIDESHEATH SLEND SS 6F .021 (SHEATH) IMPLANT
GUIDEWIRE .025 260CM (WIRE) IMPLANT
GUIDEWIRE INQWIRE 1.5J.035X260 (WIRE) IMPLANT
INQWIRE 1.5J .035X260CM (WIRE) ×1 IMPLANT
KIT SINGLE USE MANIFOLD (KITS) IMPLANT
KIT SYRINGE INJ CVI SPIKEX1 (MISCELLANEOUS) IMPLANT
PACK CARDIAC CATHETERIZATION (CUSTOM PROCEDURE TRAY) ×1 IMPLANT
SET ATX-X65L (MISCELLANEOUS) IMPLANT
SHEATH GLIDE SLENDER 4/5FR (SHEATH) IMPLANT
SHEATH PROBE COVER 6X72 (BAG) IMPLANT

## 2023-08-31 NOTE — ED Notes (Signed)
Pt up to bathroom with even steady gait.

## 2023-08-31 NOTE — H&P (View-Only) (Signed)
 Progress Note  Patient Name: Dana Pruitt Date of Encounter: 08/31/2023  Primary Cardiologist: Dana Batty, MD   Subjective   Patient seen and examined at her bedside. No chest pain she yesterday. She is aware of her heart cath today  Inpatient Medications    Scheduled Meds:  atorvastatin  20 mg Oral Daily   bisoprolol  5 mg Oral Daily   ezetimibe  10 mg Oral Daily   ferrous sulfate  325 mg Oral QODAY   fluticasone  2 spray Each Nare Daily   fluticasone furoate-vilanterol  1 puff Inhalation Daily   And   umeclidinium bromide  1 puff Inhalation Daily   ipratropium  2 spray Each Nare QID   losartan  25 mg Oral Daily   melatonin  3 mg Oral QHS   sertraline  100 mg Oral BID   Continuous Infusions:  heparin 750 Units/hr (08/31/23 0304)   PRN Meds: acetaminophen, albuterol, nitroGLYCERIN, ondansetron (ZOFRAN) IV   Vital Signs    Vitals:   08/30/23 1900 08/30/23 2100 08/30/23 2300 08/31/23 0725  BP: (!) 110/58 118/78 106/72 117/71  Pulse: 80 (!) 56 78 73  Resp: 18 (!) 22 19 19   Temp:    97.7 F (36.5 C)  TempSrc:    Oral  SpO2: 90% 94% 95% 94%   No intake or output data in the 24 hours ending 08/31/23 0846 There were no vitals filed for this visit.  Telemetry     - Personally Reviewed  ECG     - Personally Reviewed  Physical Exam     General: Comfortable Head: Atraumatic, normal size  Eyes: PEERLA, EOMI  Neck: Supple, normal JVD Cardiac: Normal S1, S2; RRR; no murmurs, rubs, or gallops Lungs: Clear to auscultation bilaterally Abd: Soft, nontender, no hepatomegaly  Ext: warm, no edema Musculoskeletal: No deformities, BUE and BLE strength normal and equal Skin: Warm and dry, no rashes   Neuro: Alert and oriented to person, place, time, and situation, CNII-XII grossly intact, no focal deficits  Psych: Normal mood and affect   Labs    Chemistry Recent Labs  Lab 08/27/23 1334 08/30/23 0928 08/30/23 1000 08/31/23 0506  NA 138 140  --  134*   K 4.5 4.1  --  4.5  CL 103 103  --  106  CO2 25 26  --  25  GLUCOSE 191* 111*  --  123*  BUN 32* 33*  --  34*  CREATININE 1.08 1.29*  --  1.12*  CALCIUM 9.8 9.5  --  9.0  PROT  --   --  5.4*  --   ALBUMIN  --   --  3.4*  --   AST  --   --  15  --   ALT  --   --  15  --   ALKPHOS  --   --  20*  --   BILITOT  --   --  0.8  --   GFRNONAA  --  42*  --  49*  ANIONGAP  --  11  --  3*     Hematology Recent Labs  Lab 08/30/23 0928 08/31/23 0506  WBC 7.6 9.8  RBC 3.62* 3.75*  HGB 11.8* 12.2  HCT 35.9* 36.4  MCV 99.2 97.1  MCH 32.6 32.5  MCHC 32.9 33.5  RDW 14.2 14.2  PLT 180 169    Cardiac EnzymesNo results for input(s): "TROPONINI" in the last 168 hours. No results for input(s): "TROPIPOC" in the  last 168 hours.   BNPNo results for input(s): "BNP", "PROBNP" in the last 168 hours.   DDimer No results for input(s): "DDIMER" in the last 168 hours.   Radiology    DG Chest Port 1 View Result Date: 08/30/2023 CLINICAL DATA:  Chest pain EXAM: PORTABLE CHEST 1 VIEW COMPARISON:  08/16/2023 FINDINGS: Artifact overlies the chest. Previous median sternotomy and aortic valve replacement. Heart size is normal. Aortic atherosclerotic calcification is seen. There are emphysematous changes of the upper lungs. Scattered areas of pulmonary scarring but no sign of active consolidation, collapse or effusion. Chronic degenerative changes affect the shoulders. IMPRESSION: Previous median sternotomy and aortic valve replacement. Emphysematous changes of the upper lungs. Scattered areas of pulmonary scarring. No active disease. Electronically Signed   By: Dana Pruitt M.D.   On: 08/30/2023 11:21    Cardiac Studies   Echo reviewed   Patient Profile     83 y.o. female with hx of mild hx of mild nonobstructive CAD on heart cath 12/2018, severe AI status post AVR 04/2019, nonischemic cardiomyopathy, heart failure with recovered EF , paroxysmal atrial fibrillation, ascending aortic aneurysm, CVA 2023,  COPD   Assessment & Plan    Mild Nonobstructive CAD Status post AVR Nonischemic cardiomyopathy Ascending aortic aneurysm Hx of CVA  PAF   Chest pain has resolved on heparin gtt. She is pending heat cath today - Informed Consent   Shared Decision Making/Informed Consent The risks [stroke (1 in 1000), death (1 in 1000), kidney failure [usually temporary] (1 in 500), bleeding (1 in 200), allergic reaction [possibly serious] (1 in 200)], benefits (diagnostic support and management of coronary artery disease) and alternatives of a cardiac catheterization were discussed in detail with Dana Pruitt and she is willing to proceed.     Continue her atorvastatin.  Add aspirin 81 mg daily.   PAF - heart controlled continue bisoprolol and now on hep gtt  Blood pressure at target.   For questions or updates, please contact [facility] Please consult [email or web address] for contact info under Cardiology/STEMI.      Dana Shipper, DO  08/31/2023, 8:46 AM

## 2023-08-31 NOTE — Progress Notes (Signed)
 Progress Note  Patient Name: Dana Pruitt Date of Encounter: 08/31/2023  Primary Cardiologist: Nanetta Batty, MD   Subjective   Patient seen and examined at her bedside. No chest pain she yesterday. She is aware of her heart cath today  Inpatient Medications    Scheduled Meds:  atorvastatin  20 mg Oral Daily   bisoprolol  5 mg Oral Daily   ezetimibe  10 mg Oral Daily   ferrous sulfate  325 mg Oral QODAY   fluticasone  2 spray Each Nare Daily   fluticasone furoate-vilanterol  1 puff Inhalation Daily   And   umeclidinium bromide  1 puff Inhalation Daily   ipratropium  2 spray Each Nare QID   losartan  25 mg Oral Daily   melatonin  3 mg Oral QHS   sertraline  100 mg Oral BID   Continuous Infusions:  heparin 750 Units/hr (08/31/23 0304)   PRN Meds: acetaminophen, albuterol, nitroGLYCERIN, ondansetron (ZOFRAN) IV   Vital Signs    Vitals:   08/30/23 1900 08/30/23 2100 08/30/23 2300 08/31/23 0725  BP: (!) 110/58 118/78 106/72 117/71  Pulse: 80 (!) 56 78 73  Resp: 18 (!) 22 19 19   Temp:    97.7 F (36.5 C)  TempSrc:    Oral  SpO2: 90% 94% 95% 94%   No intake or output data in the 24 hours ending 08/31/23 0846 There were no vitals filed for this visit.  Telemetry     - Personally Reviewed  ECG     - Personally Reviewed  Physical Exam     General: Comfortable Head: Atraumatic, normal size  Eyes: PEERLA, EOMI  Neck: Supple, normal JVD Cardiac: Normal S1, S2; RRR; no murmurs, rubs, or gallops Lungs: Clear to auscultation bilaterally Abd: Soft, nontender, no hepatomegaly  Ext: warm, no edema Musculoskeletal: No deformities, BUE and BLE strength normal and equal Skin: Warm and dry, no rashes   Neuro: Alert and oriented to person, place, time, and situation, CNII-XII grossly intact, no focal deficits  Psych: Normal mood and affect   Labs    Chemistry Recent Labs  Lab 08/27/23 1334 08/30/23 0928 08/30/23 1000 08/31/23 0506  NA 138 140  --  134*   K 4.5 4.1  --  4.5  CL 103 103  --  106  CO2 25 26  --  25  GLUCOSE 191* 111*  --  123*  BUN 32* 33*  --  34*  CREATININE 1.08 1.29*  --  1.12*  CALCIUM 9.8 9.5  --  9.0  PROT  --   --  5.4*  --   ALBUMIN  --   --  3.4*  --   AST  --   --  15  --   ALT  --   --  15  --   ALKPHOS  --   --  20*  --   BILITOT  --   --  0.8  --   GFRNONAA  --  42*  --  49*  ANIONGAP  --  11  --  3*     Hematology Recent Labs  Lab 08/30/23 0928 08/31/23 0506  WBC 7.6 9.8  RBC 3.62* 3.75*  HGB 11.8* 12.2  HCT 35.9* 36.4  MCV 99.2 97.1  MCH 32.6 32.5  MCHC 32.9 33.5  RDW 14.2 14.2  PLT 180 169    Cardiac EnzymesNo results for input(s): "TROPONINI" in the last 168 hours. No results for input(s): "TROPIPOC" in the  last 168 hours.   BNPNo results for input(s): "BNP", "PROBNP" in the last 168 hours.   DDimer No results for input(s): "DDIMER" in the last 168 hours.   Radiology    DG Chest Port 1 View Result Date: 08/30/2023 CLINICAL DATA:  Chest pain EXAM: PORTABLE CHEST 1 VIEW COMPARISON:  08/16/2023 FINDINGS: Artifact overlies the chest. Previous median sternotomy and aortic valve replacement. Heart size is normal. Aortic atherosclerotic calcification is seen. There are emphysematous changes of the upper lungs. Scattered areas of pulmonary scarring but no sign of active consolidation, collapse or effusion. Chronic degenerative changes affect the shoulders. IMPRESSION: Previous median sternotomy and aortic valve replacement. Emphysematous changes of the upper lungs. Scattered areas of pulmonary scarring. No active disease. Electronically Signed   By: Paulina Fusi M.D.   On: 08/30/2023 11:21    Cardiac Studies   Echo reviewed   Patient Profile     83 y.o. female with hx of mild hx of mild nonobstructive CAD on heart cath 12/2018, severe AI status post AVR 04/2019, nonischemic cardiomyopathy, heart failure with recovered EF , paroxysmal atrial fibrillation, ascending aortic aneurysm, CVA 2023,  COPD   Assessment & Plan    Mild Nonobstructive CAD Status post AVR Nonischemic cardiomyopathy Ascending aortic aneurysm Hx of CVA  PAF   Chest pain has resolved on heparin gtt. She is pending heat cath today - Informed Consent   Shared Decision Making/Informed Consent The risks [stroke (1 in 1000), death (1 in 1000), kidney failure [usually temporary] (1 in 500), bleeding (1 in 200), allergic reaction [possibly serious] (1 in 200)], benefits (diagnostic support and management of coronary artery disease) and alternatives of a cardiac catheterization were discussed in detail with Dana Pruitt and she is willing to proceed.     Continue her atorvastatin.  Add aspirin 81 mg daily.   PAF - heart controlled continue bisoprolol and now on hep gtt  Blood pressure at target.   For questions or updates, please contact [facility] Please consult [email or web address] for contact info under Cardiology/STEMI.      Osvaldo Shipper, DO  08/31/2023, 8:46 AM

## 2023-08-31 NOTE — ED Notes (Signed)
 Pt up to bathroom with o2 and IV pole, even steady gait

## 2023-08-31 NOTE — Progress Notes (Signed)
 PHARMACY - ANTICOAGULATION CONSULT NOTE  Pharmacy Consult for heparin  Indication: atrial fibrillation  No Known Allergies  Patient Measurements: Height: 5\' 2"  (157.5 cm) Weight: 53.3 kg (117 lb 8 oz) IBW/kg (Calculated) : 50.1 Heparin Dosing Weight: 56.2kg   Vital Signs: Temp: 97.8 F (36.6 C) (03/11 1345) Temp Source: Oral (03/11 1345) BP: 98/73 (03/11 1345) Pulse Rate: 72 (03/11 1345)  Labs: Recent Labs    08/30/23 0928 08/30/23 1103 08/31/23 0506 08/31/23 1315  HGB 11.8*  --  12.2  --   HCT 35.9*  --  36.4  --   PLT 180  --  169  --   APTT  --   --   --  49*  CREATININE 1.29*  --  1.12*  --   TROPONINIHS 15 15  --   --     Estimated Creatinine Clearance: 31.2 mL/min (A) (by C-G formula based on SCr of 1.12 mg/dL (H)).   Medical History: Past Medical History:  Diagnosis Date   Anemia    "several times over the years" (10/17/2012)   Anxiety    Breast cancer (HCC) 02/2010   right breast   CAD (coronary artery disease)    a. LHC 12/2018: mild non-obstructive CAD   Carcinoma of breast treated with adjuvant hormone therapy (HCC) 02/2010   Femara   Chronic systolic CHF    a. Echo in 2015: LVEF of 35-40%, b. 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 Hx of afib on Eliquis PTA, last dose today at 15:00. Hgb 11.8 and PLTs 180. Potential for LHC and RHC tomorrow pending  scheduled availability, pharmacy consulted to dose heparin infusion.   3/11 PM: APTT subtherapeutic at 49 on 750 mL/hr. No issues with infusion and no new bleeding noted. Hgb stable at 12.2, PLT WNL.   Goal of Therapy:  Heparin level 0.3-0.7 units/ml aPTT 66-102 Monitor platelets by anticoagulation protocol: Yes   Plan:  No bolus with recent DOAC intake.  Increase heparin infusion to 900 units/hr (~3 units/kg/hr) Check anti-Xa level in 8 hours and daily while on heparin Obtain heparin level daily and monitor APTT until levels correlate Continue to monitor H&H and platelets  Lennie Muckle, PharmD PGY1 Pharmacy Resident 08/31/2023 1:57 PM

## 2023-08-31 NOTE — Plan of Care (Signed)

## 2023-08-31 NOTE — Care Management Obs Status (Signed)
 MEDICARE OBSERVATION STATUS NOTIFICATION   Patient Details  Name: Dana Pruitt MRN: [id number] Date of Birth: 06/13/1941   Medicare Observation Status Notification Given:  Yes    Alesia Richards, RN 08/31/2023, 4:37 PM

## 2023-08-31 NOTE — Interval H&P Note (Signed)
 History and Physical Interval Note:  08/31/2023 4:50 PM  Dana Pruitt  has presented today for surgery, with the diagnosis of Chest pain.  The various methods of treatment have been discussed with the patient and family. After consideration of risks, benefits and other options for treatment, the patient has consented to  Procedure(s): RIGHT/LEFT HEART CATH AND CORONARY ANGIOGRAPHY (N/A) as a surgical intervention.  The patient's history has been reviewed, patient examined, no change in status, stable for surgery.  I have reviewed the patient's chart and labs.  Questions were answered to the patient's satisfaction.     Tonny Bollman

## 2023-09-01 ENCOUNTER — Encounter (HOSPITAL_COMMUNITY): Payer: Self-pay | Admitting: Cardiovascular Disease

## 2023-09-01 ENCOUNTER — Ambulatory Visit: Admitting: Physician Assistant

## 2023-09-01 DIAGNOSIS — Z7901 Long term (current) use of anticoagulants: Secondary | ICD-10-CM | POA: Diagnosis not present

## 2023-09-01 DIAGNOSIS — I2510 Atherosclerotic heart disease of native coronary artery without angina pectoris: Secondary | ICD-10-CM | POA: Diagnosis present

## 2023-09-01 DIAGNOSIS — Y92230 Patient room in hospital as the place of occurrence of the external cause: Secondary | ICD-10-CM | POA: Diagnosis not present

## 2023-09-01 DIAGNOSIS — I959 Hypotension, unspecified: Secondary | ICD-10-CM | POA: Diagnosis present

## 2023-09-01 DIAGNOSIS — D649 Anemia, unspecified: Secondary | ICD-10-CM | POA: Diagnosis present

## 2023-09-01 DIAGNOSIS — N179 Acute kidney failure, unspecified: Secondary | ICD-10-CM | POA: Diagnosis present

## 2023-09-01 DIAGNOSIS — R0989 Other specified symptoms and signs involving the circulatory and respiratory systems: Secondary | ICD-10-CM | POA: Diagnosis not present

## 2023-09-01 DIAGNOSIS — E785 Hyperlipidemia, unspecified: Secondary | ICD-10-CM | POA: Diagnosis present

## 2023-09-01 DIAGNOSIS — S40021A Contusion of right upper arm, initial encounter: Secondary | ICD-10-CM | POA: Diagnosis present

## 2023-09-01 DIAGNOSIS — K219 Gastro-esophageal reflux disease without esophagitis: Secondary | ICD-10-CM | POA: Diagnosis present

## 2023-09-01 DIAGNOSIS — D696 Thrombocytopenia, unspecified: Secondary | ICD-10-CM | POA: Diagnosis present

## 2023-09-01 DIAGNOSIS — E8809 Other disorders of plasma-protein metabolism, not elsewhere classified: Secondary | ICD-10-CM | POA: Diagnosis present

## 2023-09-01 DIAGNOSIS — I493 Ventricular premature depolarization: Secondary | ICD-10-CM | POA: Diagnosis not present

## 2023-09-01 DIAGNOSIS — I5042 Chronic combined systolic (congestive) and diastolic (congestive) heart failure: Secondary | ICD-10-CM | POA: Diagnosis present

## 2023-09-01 DIAGNOSIS — J9611 Chronic respiratory failure with hypoxia: Secondary | ICD-10-CM | POA: Diagnosis present

## 2023-09-01 DIAGNOSIS — I7121 Aneurysm of the ascending aorta, without rupture: Secondary | ICD-10-CM | POA: Diagnosis present

## 2023-09-01 DIAGNOSIS — W19XXXA Unspecified fall, initial encounter: Secondary | ICD-10-CM | POA: Diagnosis present

## 2023-09-01 DIAGNOSIS — Z9981 Dependence on supplemental oxygen: Secondary | ICD-10-CM | POA: Diagnosis not present

## 2023-09-01 DIAGNOSIS — Y848 Other medical procedures as the cause of abnormal reaction of the patient, or of later complication, without mention of misadventure at the time of the procedure: Secondary | ICD-10-CM | POA: Diagnosis present

## 2023-09-01 DIAGNOSIS — J439 Emphysema, unspecified: Secondary | ICD-10-CM | POA: Diagnosis present

## 2023-09-01 DIAGNOSIS — I480 Paroxysmal atrial fibrillation: Secondary | ICD-10-CM | POA: Diagnosis present

## 2023-09-01 DIAGNOSIS — R911 Solitary pulmonary nodule: Secondary | ICD-10-CM | POA: Diagnosis present

## 2023-09-01 DIAGNOSIS — R55 Syncope and collapse: Secondary | ICD-10-CM | POA: Diagnosis present

## 2023-09-01 DIAGNOSIS — I428 Other cardiomyopathies: Secondary | ICD-10-CM | POA: Diagnosis present

## 2023-09-01 DIAGNOSIS — I447 Left bundle-branch block, unspecified: Secondary | ICD-10-CM | POA: Diagnosis present

## 2023-09-01 DIAGNOSIS — W1811XA Fall from or off toilet without subsequent striking against object, initial encounter: Secondary | ICD-10-CM | POA: Diagnosis present

## 2023-09-01 DIAGNOSIS — S00531A Contusion of lip, initial encounter: Secondary | ICD-10-CM | POA: Diagnosis present

## 2023-09-01 DIAGNOSIS — F32A Depression, unspecified: Secondary | ICD-10-CM | POA: Diagnosis present

## 2023-09-01 DIAGNOSIS — R079 Chest pain, unspecified: Secondary | ICD-10-CM | POA: Diagnosis present

## 2023-09-01 DIAGNOSIS — I110 Hypertensive heart disease with heart failure: Secondary | ICD-10-CM | POA: Diagnosis present

## 2023-09-01 LAB — CBC
HCT: 35.9 % — ABNORMAL LOW (ref 36.0–46.0)
Hemoglobin: 12.1 g/dL (ref 12.0–15.0)
MCH: 33 pg (ref 26.0–34.0)
MCHC: 33.7 g/dL (ref 30.0–36.0)
MCV: 97.8 fL (ref 80.0–100.0)
Platelets: 161 10*3/uL (ref 150–400)
RBC: 3.67 MIL/uL — ABNORMAL LOW (ref 3.87–5.11)
RDW: 14.2 % (ref 11.5–15.5)
WBC: 9.2 10*3/uL (ref 4.0–10.5)
nRBC: 0 % (ref 0.0–0.2)

## 2023-09-01 LAB — BASIC METABOLIC PANEL
Anion gap: 7 (ref 5–15)
BUN: 31 mg/dL — ABNORMAL HIGH (ref 8–23)
CO2: 26 mmol/L (ref 22–32)
Calcium: 9.5 mg/dL (ref 8.9–10.3)
Chloride: 104 mmol/L (ref 98–111)
Creatinine, Ser: 1.36 mg/dL — ABNORMAL HIGH (ref 0.44–1.00)
GFR, Estimated: 39 mL/min — ABNORMAL LOW (ref >60)
Glucose, Bld: 128 mg/dL — ABNORMAL HIGH (ref 70–99)
Potassium: 4.5 mmol/L (ref 3.5–5.1)
Sodium: 137 mmol/L (ref 135–145)

## 2023-09-01 LAB — GLUCOSE, CAPILLARY: Glucose-Capillary: 123 mg/dL — ABNORMAL HIGH (ref 70–99)

## 2023-09-01 MED ORDER — BISOPROLOL FUMARATE 5 MG PO TABS
5.0000 mg | ORAL_TABLET | Freq: Every day | ORAL | Status: DC
  Administered 2023-09-02: 5 mg via ORAL
  Filled 2023-09-01: qty 1

## 2023-09-01 MED ORDER — LOSARTAN POTASSIUM 25 MG PO TABS
25.0000 mg | ORAL_TABLET | Freq: Every day | ORAL | Status: DC
  Administered 2023-09-02: 25 mg via ORAL
  Filled 2023-09-01: qty 1

## 2023-09-01 MED ORDER — AMIODARONE HCL IN DEXTROSE 360-4.14 MG/200ML-% IV SOLN
30.0000 mg/h | INTRAVENOUS | Status: DC
  Administered 2023-09-01: 30 mg/h via INTRAVENOUS
  Filled 2023-09-01 (×3): qty 200

## 2023-09-01 MED ORDER — AMIODARONE LOAD VIA INFUSION
150.0000 mg | Freq: Once | INTRAVENOUS | Status: AC
  Administered 2023-09-01: 150 mg via INTRAVENOUS
  Filled 2023-09-01: qty 83.34

## 2023-09-01 MED ORDER — AMIODARONE IV BOLUS ONLY 150 MG/100ML
150.0000 mg | Freq: Once | INTRAVENOUS | Status: AC
  Administered 2023-09-01: 150 mg via INTRAVENOUS
  Filled 2023-09-01: qty 100

## 2023-09-01 MED ORDER — SODIUM CHLORIDE 0.9 % IV BOLUS
250.0000 mL | Freq: Once | INTRAVENOUS | Status: AC
  Administered 2023-09-01: 250 mL via INTRAVENOUS

## 2023-09-01 MED ORDER — AMIODARONE HCL IN DEXTROSE 360-4.14 MG/200ML-% IV SOLN
60.0000 mg/h | INTRAVENOUS | Status: DC
  Administered 2023-09-01 (×2): 60 mg/h via INTRAVENOUS
  Filled 2023-09-01: qty 200

## 2023-09-01 MED FILL — Verapamil HCl IV Soln 2.5 MG/ML: INTRAVENOUS | Qty: 2 | Status: AC

## 2023-09-01 NOTE — Progress Notes (Signed)
   09/01/23 0400  Assess: MEWS Score  BP 96/74  MAP (mmHg) 82  Pulse Rate 78  ECG Heart Rate (!) 129  Resp 15  SpO2 92 %  Assess: MEWS Score  MEWS Temp 0  MEWS Systolic 1  MEWS Pulse 2  MEWS RR 0  MEWS LOC 0  MEWS Score 3  MEWS Score Color Yellow  Assess: if the MEWS score is Yellow or Red  Were vital signs accurate and taken at a resting state? Yes  Does the patient meet 2 or more of the SIRS criteria? No  MEWS guidelines implemented  Yes, yellow  Treat  MEWS Interventions Considered administering scheduled or prn medications/treatments as ordered  Take Vital Signs  Increase Vital Sign Frequency  Yellow: Q2hr x1, continue Q4hrs until patient remains green for 12hrs  Escalate  MEWS: Escalate Yellow: Discuss with charge nurse and consider notifying provider and/or RRT  Notify: Charge Nurse/RN  Name of Charge Nurse/RN Notified Heather RN  Provider Notification  Provider Name/Title Geraldo Pitter  Date Provider Notified 09/01/23  Time Provider Notified 0405  Method of Notification Page  Notification Reason Change in status;New onset of dysrhythmia  Provider response See new orders  Date of Provider Response 09/01/23  Time of Provider Response 0410  Assess: SIRS CRITERIA  SIRS Temperature  0  SIRS Respirations  0  SIRS Pulse 1  SIRS WBC 0  SIRS Score Sum  1

## 2023-09-01 NOTE — Progress Notes (Deleted)
  Cardiology Office Note:  .   Date:  09/01/2023  ID:  Dana Pruitt, DOB Jan 13, 1941, MRN [id number] PCP: Merri Brunette, MD  [facility] Providers Cardiologist:  Nanetta Batty, MD { Click to update primary MD,subspecialty MD or APP then REFRESH:1}   History of Present Illness: .   Dana Pruitt is a 83 y.o. female ***  ROS: ***  Studies Reviewed: .        *** Risk Assessment/Calculations:   {Does this patient have ATRIAL FIBRILLATION?:[phone]}         Physical Exam:   VS:  LMP  (LMP Unknown)    Wt Readings from Last 3 Encounters:  08/31/23 117 lb 8 oz (53.3 kg)  08/27/23 124 lb (56.2 kg)  08/24/23 122 lb 2.2 oz (55.4 kg)    GEN: Well nourished, well developed in no acute distress NECK: No JVD; No carotid bruits CARDIAC: ***RRR, no murmurs, rubs, gallops RESPIRATORY:  Clear to auscultation without rales, wheezing or rhonchi  ABDOMEN: Soft, non-tender, non-distended EXTREMITIES:  No edema; No deformity   ASSESSMENT AND PLAN: .   ***    {Are you ordering a CV Procedure (e.g. stress test, cath, DCCV, TEE, etc)?   Press F2        :[id number]}  Dispo: ***  Signed, Azalee Course, PA

## 2023-09-01 NOTE — Progress Notes (Signed)
 Patient converted to afib RVR HR sustaining in 130's. BP 96/74. EKG obtained. MD notified. Received order for amio bolus

## 2023-09-01 NOTE — Evaluation (Signed)
 Physical Therapy Evaluation Patient Details Name: Dana Pruitt MRN: [id number] DOB: July 05, 1940 Today's Date: 09/01/2023  History of Present Illness  Pt is an 83 y.o. female who presented 08/30/23 with chest pain. S/p right/left heart cath and coronary angiography 3/11. Hospital course complicated by syncope episode with resulting fall off commode morning of 3/12. PMH significant for Breast CA, CVA, anemia, CHF, CAD, COPD, HTN, HLD, paroxysmal afib, L ventricular dysfunction, moderate aortic insufficiency s/p aortic valve replacement.   Clinical Impression  Pt presents with condition above and deficits mentioned below, see PT Problem List. PTA, she was independent without DME for functional mobility, living with her husband in a 2-level house with 2 STE. While her bedroom is typically on the 2nd level, she is able to live on the main level with bedroom/bathroom if needed. Currently, the pt is displaying some mild deficits in balance and activity tolerance. She does intermittently sway and need reactional strategies to regain her balance when ambulating laps in the room without UE support or physical assistance, but she has been able to recover on her own each time thus far. She will likely progress well as she mobilizes more frequently, but she is currently interested in potential follow-up with HHPT to maximize her return to baseline at d/c. Will continue to follow acutely.   Orthostatics - 120/82 & 67 bpm supine 110/77 & 95 bpm sitting 115/71 & 76 bpm standing 105/81 & 88 bpm standing ~3 min       If plan is discharge home, recommend the following: A little help with walking and/or transfers;A little help with bathing/dressing/bathroom;Assistance with cooking/housework;Assist for transportation;Help with stairs or ramp for entrance   Can travel by private vehicle        Equipment Recommendations None recommended by PT  Recommendations for Other Services       Functional Status  Assessment Patient has had a recent decline in their functional status and demonstrates the ability to make significant improvements in function in a reasonable and predictable amount of time.     Precautions / Restrictions Precautions Precautions: Fall;Other (comment) Recall of Precautions/Restrictions: Intact Precaution/Restrictions Comments: 4L O2 baseline; watch BP (syncope and fall with nursing 3/12); s/p radial access heart cath 3/11 Restrictions Weight Bearing Restrictions Per Provider Order: No      Mobility  Bed Mobility Overal bed mobility: Modified Independent             General bed mobility comments: HOB elevated, slightly increased time, no assistance needed    Transfers Overall transfer level: Needs assistance Equipment used: None Transfers: Sit to/from Stand Sit to Stand: Contact guard assist           General transfer comment: No LOB standing up from EOB, CGA for safety    Ambulation/Gait Ambulation/Gait assistance: Contact guard assist Gait Distance (Feet): 72 Feet Assistive device: None Gait Pattern/deviations: Step-through pattern, Decreased stride length, Drifts right/left Gait velocity: reduced Gait velocity interpretation: <1.8 ft/sec, indicate of risk for recurrent falls   General Gait Details: Pt ambulated laps within the room to allow pt a place to sit readily available if needed this date as pt had a syncope episode this AM. Pt did display intermittent moments where she would laterally sway, but she recovered with her own reactional strategies. CGA for safety  Stairs            Wheelchair Mobility     Tilt Bed    Modified Rankin (Stroke Patients Only)  Balance Overall balance assessment: Needs assistance Sitting-balance support: No upper extremity supported, Feet supported Sitting balance-Leahy Scale: Good Sitting balance - Comments: No LOB sitting EOB   Standing balance support: No upper extremity supported, During  functional activity Standing balance-Leahy Scale: Fair Standing balance comment: mild lateral sway noted, but no UE support or physical assistance needed to ambulate laps in the room                             Pertinent Vitals/Pain Pain Assessment Pain Assessment: Faces Faces Pain Scale: No hurt Pain Intervention(s): Monitored during session    Home Living Family/patient expects to be discharged to:: Private residence Living Arrangements: Spouse/significant other Available Help at Discharge: Family;Available 24 hours/day (husband uses a RW though) Type of Home: House Home Access: Stairs to enter Entrance Stairs-Rails: Can reach both (bil posts, not rails) Entrance Stairs-Number of Steps: 2 Alternate Level Stairs-Number of Steps: 15 Home Layout: Two level;Able to live on main level with bedroom/bathroom;Bed/bath upstairs (sofa bed downstairs with full bath) Home Equipment: Grab bars - tub/shower;Grab bars - toilet;Shower seat - built [employer] fundraiser (2 wheels) Additional Comments: on 4L O2 baseline at all times    Prior Function Prior Level of Function : Independent/Modified Independent;Driving             Mobility Comments: no AD, no other falls other than the one during this admission due to syncope event ADLs Comments: Prepares meals, occasional help for cleaning     Extremity/Trunk Assessment   Upper Extremity Assessment Upper Extremity Assessment: Overall WFL for tasks assessed    Lower Extremity Assessment Lower Extremity Assessment: Overall WFL for tasks assessed    Cervical / Trunk Assessment Cervical / Trunk Assessment: Normal  Communication   Communication Communication: No apparent difficulties    Cognition Arousal: Alert Behavior During Therapy: WFL for tasks assessed/performed   PT - Cognitive impairments: No apparent impairments                         Following commands: Intact       Cueing Cueing Techniques: Verbal  cues     General Comments General comments (skin integrity, edema, etc.): 120/82 & 67 bpm supine, 110/77 & 95 bpm sitting, 115/71 & 76 bpm standing, 105/81 & 88 bpm standing ~3 min; pt denied any symptoms throughout the session; educated pt to change positions slowly and continue to mobilize with nursing and sit up in chair as able    Exercises     Assessment/Plan    PT Assessment Patient needs continued PT services  PT Problem List Decreased activity tolerance;Decreased balance;Decreased mobility;Cardiopulmonary status limiting activity       PT Treatment Interventions DME instruction;Gait training;Stair training;Functional mobility training;Therapeutic activities;Therapeutic exercise;Balance training;Neuromuscular re-education;Patient/family education    PT Goals (Current goals can be found in the Care Plan section)  Acute Rehab PT Goals Patient Stated Goal: to understand why she gets lightheaded PT Goal Formulation: With patient Time For Goal Achievement: 09/15/23 Potential to Achieve Goals: Good    Frequency Min 2X/week     Co-evaluation               AM-PAC PT "6 Clicks" Mobility  Outcome Measure Help needed turning from your back to your side while in a flat bed without using bedrails?: None Help needed moving from lying on your back to sitting on the side of a flat bed without using  bedrails?: None Help needed moving to and from a bed to a chair (including a wheelchair)?: A Little Help needed standing up from a chair using your arms (e.g., wheelchair or bedside chair)?: A Little Help needed to walk in hospital room?: A Little Help needed climbing 3-5 steps with a railing? : A Little 6 Click Score: 20    End of Session Equipment Utilized During Treatment: Gait belt Activity Tolerance: Patient tolerated treatment well Patient left: in bed;with call bell/phone within reach;with bed alarm set Nurse Communication: Mobility status;Other (comment) (vitals) PT Visit  Diagnosis: Unsteadiness on feet (R26.81);Other abnormalities of gait and mobility (R26.89);Difficulty in walking, not elsewhere classified (R26.2)    Time: 8119-1478 PT Time Calculation (min) (ACUTE ONLY): 31 min   Charges:   PT Evaluation $PT Eval Low Complexity: 1 Low PT Treatments $Therapeutic Activity: 8-22 mins PT General Charges $$ ACUTE PT VISIT: 1 Visit         Virgil Benedict, PT, DPT Acute Rehabilitation Services  Office: [phone]   Bettina Gavia 09/01/2023, 5:26 PM

## 2023-09-01 NOTE — Progress Notes (Signed)
 Chaplain responded to Code Blue which was cancelled.  Dana Pruitt Chaplalin

## 2023-09-01 NOTE — Progress Notes (Addendum)
 Patient Name: Dana Pruitt Date of Encounter: 09/01/2023 [facility] Cardiologist: Nanetta Batty, MD   Interval Summary  .    Called to bedside this morning by RN after patient had syncopal episode and fell off of bedside commode.  Patient does have swelling to her upper lip, alert and oriented at the time of interview.  Initial BP in the 70s systolic though improving at the time of assessment.  She denies any pain other than her lip.  Vital Signs .    Vitals:   09/01/23 0018 09/01/23 0026 09/01/23 0400 09/01/23 0500  BP: (!) 83/57 (!) 101/56 96/74 111/74  Pulse: 61 62 78 (!) 37  Resp: 18 14 15 15   Temp: 97.6 F (36.4 C)     TempSrc: Oral     SpO2: 96% 94% 92% 92%  Weight:      Height:        Intake/Output Summary (Last 24 hours) at 09/01/2023 0701 Last data filed at 09/01/2023 0437 Gross per 24 hour  Intake 416.64 ml  Output --  Net 416.64 ml      08/31/2023    1:45 PM 08/27/2023   11:38 AM 08/24/2023    1:48 PM  Last 3 Weights  Weight (lbs) 117 lb 8 oz 124 lb 122 lb 2.2 oz  Weight (kg) 53.298 kg 56.246 kg 55.4 kg      Telemetry/ECG    Atrial fibrillation with RVR starting around 4am - Personally Reviewed  Physical Exam .    GEN: No acute distress.   Neck: No JVD Cardiac: Irreg Irreg, no murmurs, rubs, or gallops.  Respiratory: Clear to auscultation bilaterally. GI: Soft, nontender, non-distended  MS: No edema Skin: Some bruising to her upper lip, right radial cath site stable with diffuse bruising to the arm.  Assessment & Plan .     Atrial fibrillation with RVR Syncope -- known history of paroxysmal atrial fibrillation, appears she converted into A-fib RVR around 4 AM.  Received 150 mg IV amiodarone bolus. --Rates remain elevated this morning, called to the bedside after patient had a witnessed syncopal episode and struck her upper lip after falling off the bedside commode. -- Systolic BP in the 70s initially, improving at time of  assessment. -- NS 250 bolus x1 -- Start IV amiodarone with bolus -- Continue Eliquis 2.5 mg twice daily -- Denies striking her head, alert and oriented therefore will defer CT head at this time.  Chest pain CAD -- Underwent cardiac catheterization 3/11 with patent coronary arteries, mild irregularities but no stenotic lesions.  Recommendations for medical therapy. -- on ASA, statin   HFrEF NICM -- Echocardiogram this admission with LVEF of 30 to 35%, global hypokinesis, grade 1 diastolic dysfunction, mildly reduced RV -- As above, hypotensive this morning with wedge pressure of 5 on cath. -- NS 250 bolus x 1 -- Hold bisoprolol and losartan pending BP response  Aortic regurg status post AVR '20 -- Echo with normal valve function, mean gradient of 12 mmHg with no perivalvular leak  Hx of CVA  For questions or updates, please contact [facility] Please consult [email or web address] for contact info under   Signed, Laverda Page, NP  Patient seen and examined, note reviewed with the signed Advanced Practice Provider. I personally reviewed laboratory data, imaging studies and relevant notes. I independently examined the patient and formulated the important aspects of the plan. I have personally discussed the plan with the patient and/or family. Comments or changes to  the note/plan are indicated below.  Patient seen examined by her bedside.  Nurse was by the bedside during my visit.   GEN:  Well nourished, well developed in no acute distress HEENT: Lips with bruising, mucous membranes moist, good dentition NECK: No JVD; No carotid bruits LYMPHATICS: No lymphadenopathy CARDIAC: S1S2 noted, RRR, no murmurs, rubs, gallops RESPIRATORY:  Clear to auscultation without rales, wheezing or rhonchi  ABDOMEN: Soft, non-tender, non-distended, bowel sounds noted, no guarding EXTREMITIES:No cyanosis, no cyanosis, no clubbing MUSCULOSKELETAL: No deformity  SKIN: Warm and dry NEUROLOGIC:   Alert and oriented x 3, nonfocal PSYCHIATRIC:  Normal affect, good insight  Paroxysmal atrial fibrillation Syncope Chest pain CAD Heart failure with reduced ejection fraction Nonischemic or myopathy Aortic regurgitation status post aortic valve replacement in 2020 History of CVA \ She had a witnessed fall this morning with no reported of hitting her head.  She does have a bruise lips.  She is at her mental status.  She had a lot of questions about plans for treatment.  Her biggest question was the source of heart depressed ejection fraction.  Shared with the patient that I am suspicious that atrial fibrillation may be playing a role here.  During the time of my encounter she was in atrial fibrillation but shortly thereafter she converted to sinus rhythm we will keep the patient with the amiodarone from drip for now.  Blood pressure is marginal continue to hold her bisoprolol and losartan.  Once this improve we will be able to restart her guideline directed medical therapy. Continue patient on her Eliquis 2.5 mg daily.   Thomasene Ripple DO, MS [facility] Attending Cardiologist [facility]  [address] [place], [place] [address] [phone] Website: [email or web address]

## 2023-09-01 NOTE — TOC Progression Note (Signed)
 Transition of Care [facility]) - Progression Note    Patient Details  Name: Dana Pruitt MRN: [id number] Date of Birth: [birth year]  Transition [facility]) CM/SW Contact  Alesia Richards, RN Phone Number:[phone] 09/01/2023, 10:43 AM  Clinical Narrative:    CM to patient's room regarding transition to Inpatient status. Patient sitting up in bed eating yogurt. Patient requested assistance with home health RN and HHA upon discharge. Per patient, no home health preferences. CM will continue to follow. CM alert to Dr. Servando Salina regarding PT/OT to assess/eval for discharge recommendations.         Expected Discharge Plan and Services    Possible Home health RN/PT/OT/HHA                                           Social Determinants of Health (SDOH) Interventions SDOH Screenings   Food Insecurity: No Food Insecurity (08/30/2023)  Housing: Low Risk  (08/30/2023)  Transportation Needs: No Transportation Needs (08/30/2023)  Utilities: Not At Risk (08/30/2023)  Depression (PHQ2-9): Medium Risk (07/26/2023)  Social Connections: Moderately Integrated (08/30/2023)  Tobacco Use: Medium Risk (08/30/2023)    Readmission Risk Interventions     No data to display

## 2023-09-01 NOTE — Plan of Care (Signed)
 Problem: Education: Goal: Understanding of cardiac disease, CV risk reduction, and recovery process will improve 09/01/2023 0855 by Sylvan Cheese, RN Outcome: Progressing 09/01/2023 0826 by Sylvan Cheese, RN Outcome: Progressing Goal: Individualized Educational Video(s) 09/01/2023 0855 by Sylvan Cheese, RN Outcome: Progressing 09/01/2023 0826 by Sylvan Cheese, RN Outcome: Progressing   Problem: Activity: Goal: Ability to tolerate increased activity will improve 09/01/2023 0855 by Sylvan Cheese, RN Outcome: Progressing 09/01/2023 0826 by Sylvan Cheese, RN Outcome: Progressing   Problem: Cardiac: Goal: Ability to achieve and maintain adequate cardiovascular perfusion will improve 09/01/2023 0855 by Sylvan Cheese, RN Outcome: Progressing 09/01/2023 0826 by Sylvan Cheese, RN Outcome: Progressing   Problem: Health Behavior/Discharge Planning: Goal: Ability to safely manage health-related needs after discharge will improve 09/01/2023 0855 by Sylvan Cheese, RN Outcome: Progressing 09/01/2023 0826 by Sylvan Cheese, RN Outcome: Progressing   Problem: Education: Goal: Knowledge of General Education information will improve Description: Including pain rating scale, medication(s)/side effects and non-pharmacologic comfort measures 09/01/2023 0855 by Sylvan Cheese, RN Outcome: Progressing 09/01/2023 0826 by Sylvan Cheese, RN Outcome: Progressing   Problem: Health Behavior/Discharge Planning: Goal: Ability to manage health-related needs will improve 09/01/2023 0855 by Sylvan Cheese, RN Outcome: Progressing 09/01/2023 0826 by Sylvan Cheese, RN Outcome: Progressing   Problem: Clinical Measurements: Goal: Ability to maintain clinical measurements within normal limits will improve 09/01/2023 0855 by Sylvan Cheese, RN Outcome: Progressing 09/01/2023 0826 by Sylvan Cheese, RN Outcome: Progressing Goal: Will remain  free from infection 09/01/2023 0855 by Sylvan Cheese, RN Outcome: Progressing 09/01/2023 0826 by Sylvan Cheese, RN Outcome: Progressing Goal: Diagnostic test results will improve 09/01/2023 0855 by Sylvan Cheese, RN Outcome: Progressing 09/01/2023 0826 by Sylvan Cheese, RN Outcome: Progressing Goal: Respiratory complications will improve 09/01/2023 0855 by Sylvan Cheese, RN Outcome: Progressing 09/01/2023 0826 by Sylvan Cheese, RN Outcome: Progressing Goal: Cardiovascular complication will be avoided 09/01/2023 0855 by Sylvan Cheese, RN Outcome: Progressing 09/01/2023 0826 by Sylvan Cheese, RN Outcome: Progressing   Problem: Activity: Goal: Risk for activity intolerance will decrease 09/01/2023 0855 by Sylvan Cheese, RN Outcome: Progressing 09/01/2023 0826 by Sylvan Cheese, RN Outcome: Progressing   Problem: Nutrition: Goal: Adequate nutrition will be maintained 09/01/2023 0855 by Sylvan Cheese, RN Outcome: Progressing 09/01/2023 0826 by Sylvan Cheese, RN Outcome: Progressing   Problem: Coping: Goal: Level of anxiety will decrease 09/01/2023 0855 by Sylvan Cheese, RN Outcome: Progressing 09/01/2023 0826 by Sylvan Cheese, RN Outcome: Progressing   Problem: Elimination: Goal: Will not experience complications related to bowel motility 09/01/2023 0855 by Sylvan Cheese, RN Outcome: Progressing 09/01/2023 0826 by Sylvan Cheese, RN Outcome: Progressing Goal: Will not experience complications related to urinary retention 09/01/2023 0855 by Sylvan Cheese, RN Outcome: Progressing 09/01/2023 0826 by Sylvan Cheese, RN Outcome: Progressing   Problem: Pain Managment: Goal: General experience of comfort will improve and/or be controlled 09/01/2023 0855 by Sylvan Cheese, RN Outcome: Progressing 09/01/2023 0826 by Sylvan Cheese, RN Outcome: Progressing   Problem: Safety: Goal: Ability to  remain free from injury will improve 09/01/2023 0855 by Sylvan Cheese, RN Outcome: Progressing 09/01/2023 0826 by Sylvan Cheese, RN Outcome: Progressing   Problem: Skin Integrity: Goal: Risk for impaired skin integrity will decrease 09/01/2023 0855 by Sylvan Cheese, RN Outcome: Progressing 09/01/2023 0826 by Sylvan Cheese, RN Outcome: Progressing   Problem: Education: Goal:  Understanding of CV disease, CV risk reduction, and recovery process will improve 09/01/2023 0855 by Sylvan Cheese, RN Outcome: Progressing 09/01/2023 0826 by Sylvan Cheese, RN Outcome: Progressing Goal: Individualized Educational Video(s) 09/01/2023 0855 by Sylvan Cheese, RN Outcome: Progressing 09/01/2023 0826 by Sylvan Cheese, RN Outcome: Progressing   Problem: Activity: Goal: Ability to return to baseline activity level will improve 09/01/2023 0855 by Sylvan Cheese, RN Outcome: Progressing 09/01/2023 0826 by Sylvan Cheese, RN Outcome: Progressing   Problem: Cardiovascular: Goal: Ability to achieve and maintain adequate cardiovascular perfusion will improve 09/01/2023 0855 by Sylvan Cheese, RN Outcome: Progressing 09/01/2023 0826 by Sylvan Cheese, RN Outcome: Progressing Goal: Vascular access site(s) Level 0-1 will be maintained 09/01/2023 0855 by Sylvan Cheese, RN Outcome: Progressing 09/01/2023 0826 by Sylvan Cheese, RN Outcome: Progressing   Problem: Health Behavior/Discharge Planning: Goal: Ability to safely manage health-related needs after discharge will improve 09/01/2023 0855 by Sylvan Cheese, RN Outcome: Progressing 09/01/2023 0826 by Sylvan Cheese, RN Outcome: Progressing   Problem: Education: Goal: Knowledge of disease or condition will improve Outcome: Progressing Goal: Understanding of medication regimen will improve Outcome: Progressing Goal: Individualized Educational Video(s) Outcome: Progressing   Problem:  Activity: Goal: Ability to tolerate increased activity will improve Outcome: Progressing   Problem: Cardiac: Goal: Ability to achieve and maintain adequate cardiopulmonary perfusion will improve Outcome: Progressing   Problem: Health Behavior/Discharge Planning: Goal: Ability to safely manage health-related needs after discharge will improve Outcome: Progressing

## 2023-09-01 NOTE — Plan of Care (Signed)

## 2023-09-01 NOTE — Progress Notes (Signed)
 Patient was sitting on bedside commode with no complaints. Pt passed out and fell forward and hit her lip on the floor. Pt was assisted to a sitting position and complained she was nauseous, she then passed out again. When patient came to, she was assisted back to bed. Once in bed, patient became unresponsive again. A code blue was called but then canceled. Vitals were taken once pt was returned to bed. Cardiology was notified.   09/01/23 0641  What Happened  Was fall witnessed? Yes  Who witnessed fall? Threasa Alpha RN  Patients activity before fall other (comment) (sitting on bedside commode)  Point of contact other (comment) (face)  Was patient injured? Yes  Provider Notification  Provider Name/Title Su Hilt NP  Date Provider Notified 09/01/23  Time Provider Notified [phone]  Method of Notification Page  Notification Reason Fall  Provider response At bedside  Date of Provider Response 09/01/23  Time of Provider Response [phone]  Follow Up  Family notified No - patient refusal (pt states she will notify)  Additional tests No  Progress note created (see row info) Yes  Adult Fall Risk Assessment  Risk Factor Category (scoring not indicated) Fall has occurred during this admission (document High fall risk)  Age 83  Fall History: Fall within 6 months prior to admission 0  Elimination; Bowel and/or Urine Incontinence 0  Elimination; Bowel and/or Urine Urgency/Frequency 0  Medications: includes PCA/Opiates, Anti-convulsants, Anti-hypertensives, Diuretics, Hypnotics, Laxatives, Sedatives, and Psychotropics 3  Patient Care Equipment 2  Mobility-Assistance 2  Mobility-Gait 2  Mobility-Sensory Deficit 0  Altered awareness of immediate physical environment 0  Impulsiveness 0  Lack of understanding of one's physical/cognitive limitations 0  Total Score 12  Patient Fall Risk Level High fall risk  Adult Fall Risk Interventions  Required Bundle Interventions *See Row Information* High fall risk -  low, moderate, and high requirements implemented  Additional Interventions Use of appropriate toileting equipment (bedpan, BSC, etc.)  Screening for Fall Injury Risk (To be completed on HIGH fall risk patients) - Assessing Need for Floor Mats  Risk For Fall Injury- Criteria for Floor Mats Previous fall this admission  Will Implement Floor Mats Yes

## 2023-09-02 ENCOUNTER — Encounter: Payer: Self-pay | Admitting: Oncology

## 2023-09-02 ENCOUNTER — Other Ambulatory Visit (HOSPITAL_COMMUNITY): Payer: Self-pay

## 2023-09-02 ENCOUNTER — Encounter (HOSPITAL_COMMUNITY): Payer: Medicare Other

## 2023-09-02 DIAGNOSIS — R0989 Other specified symptoms and signs involving the circulatory and respiratory systems: Secondary | ICD-10-CM | POA: Diagnosis not present

## 2023-09-02 LAB — CBC
HCT: 32.9 % — ABNORMAL LOW (ref 36.0–46.0)
Hemoglobin: 11.1 g/dL — ABNORMAL LOW (ref 12.0–15.0)
MCH: 32.9 pg (ref 26.0–34.0)
MCHC: 33.7 g/dL (ref 30.0–36.0)
MCV: 97.6 fL (ref 80.0–100.0)
Platelets: 136 10*3/uL — ABNORMAL LOW (ref 150–400)
RBC: 3.37 MIL/uL — ABNORMAL LOW (ref 3.87–5.11)
RDW: 14.4 % (ref 11.5–15.5)
WBC: 8.4 10*3/uL (ref 4.0–10.5)
nRBC: 0 % (ref 0.0–0.2)

## 2023-09-02 LAB — BASIC METABOLIC PANEL
Anion gap: 4 — ABNORMAL LOW (ref 5–15)
BUN: 23 mg/dL (ref 8–23)
CO2: 27 mmol/L (ref 22–32)
Calcium: 9 mg/dL (ref 8.9–10.3)
Chloride: 105 mmol/L (ref 98–111)
Creatinine, Ser: 1.05 mg/dL — ABNORMAL HIGH (ref 0.44–1.00)
GFR, Estimated: 53 mL/min — ABNORMAL LOW (ref >60)
Glucose, Bld: 124 mg/dL — ABNORMAL HIGH (ref 70–99)
Potassium: 4.2 mmol/L (ref 3.5–5.1)
Sodium: 136 mmol/L (ref 135–145)

## 2023-09-02 LAB — TSH: TSH: 2.008 u[IU]/mL (ref 0.350–4.500)

## 2023-09-02 MED ORDER — BISOPROLOL FUMARATE 5 MG PO TABS: 2.5000 mg | ORAL_TABLET | Freq: Every day | ORAL | Status: DC

## 2023-09-02 MED ORDER — AMIODARONE HCL 200 MG PO TABS
200.0000 mg | ORAL_TABLET | Freq: Every day | ORAL | 3 refills | Status: DC
  Filled 2023-09-02: qty 90, 90d supply, fill #0

## 2023-09-02 MED ORDER — AMIODARONE HCL 200 MG PO TABS
200.0000 mg | ORAL_TABLET | Freq: Every day | ORAL | Status: DC
  Administered 2023-09-02: 200 mg via ORAL
  Filled 2023-09-02: qty 1

## 2023-09-02 MED ORDER — BISOPROLOL FUMARATE 5 MG PO TABS
2.5000 mg | ORAL_TABLET | Freq: Every day | ORAL | 3 refills | Status: DC
  Filled 2023-09-02: qty 15, 30d supply, fill #0

## 2023-09-02 NOTE — TOC Transition Note (Signed)
 Transition of Care [facility]) - Discharge Note   Patient Details  Name: Dana Pruitt MRN: [id number] Date of Birth: 1940/10/23  Transition of Care [facility] CM/SW Contact:  Gala Lewandowsky, RN Phone Number: 09/02/2023, 12:39 PM   Clinical Narrative: Patient plans to transition home today with home health physical therapy. Medicare.gov list reviewed in the room on the computer and the patient chose [facility]. Office will call the patient with visit times. No DME needed. Patient has transportation home.  Final next level of care: Home w Home Health Services  Patient Goals and CMS Choice Patient states their goals for this hospitalization and ongoing recovery are:: pla to return home   Choice offered to / list presented to : Patient (reviewed in the patients room on the computer)  Discharge Plan and Services Additional resources added to the After Visit Summary for     Discharge Planning Services: CM Consult Post Acute Care Choice: Home Health           HH Arranged: PT [facility] Agency: [facility] Date [facility] Agency Contacted: 09/02/23 Time HH Agency Contacted: 1238 Representative spoke with at [facility] Agency: Haywood Lasso  Social Drivers of Health (SDOH) Interventions SDOH Screenings   Food Insecurity: No Food Insecurity (08/30/2023)  Housing: Low Risk  (08/30/2023)  Transportation Needs: No Transportation Needs (08/30/2023)  Utilities: Not At Risk (08/30/2023)  Depression (PHQ2-9): Medium Risk (07/26/2023)  Social Connections: Moderately Integrated (08/30/2023)  Tobacco Use: Medium Risk (08/30/2023)   Readmission Risk Interventions     No data to display

## 2023-09-02 NOTE — Progress Notes (Addendum)
 Patient Name: Dana Pruitt Date of Encounter: 09/02/2023 [facility] Cardiologist: Nanetta Batty, MD   Interval Summary  .    Patient is doing really well today, feels much better and stronger Still on 4 L of oxygen via [place] but states her breathing is definitely improved Explained her results at length so she had a better understanding of her current condition   Vital Signs .    Vitals:   09/01/23 2109 09/02/23 0341 09/02/23 0500 09/02/23 0844  BP: 129/80 125/82  116/76  Pulse: 63 62 (!) 58 62  Resp: (!) 21 14 (!) 31 13  Temp: 98 F (36.7 C) 98.2 F (36.8 C)  98.5 F (36.9 C)  TempSrc: Oral Oral  Oral  SpO2: 95% 98% 97% 95%  Weight:   53.8 kg   Height:        Intake/Output Summary (Last 24 hours) at 09/02/2023 0935 Last data filed at 09/01/2023 2115 Gross per 24 hour  Intake 360 ml  Output --  Net 360 ml      09/02/2023    5:00 AM 08/31/2023    1:45 PM 08/27/2023   11:38 AM  Last 3 Weights  Weight (lbs) 118 lb 11.2 oz 117 lb 8 oz 124 lb  Weight (kg) 53.842 kg 53.298 kg 56.246 kg      Telemetry/ECG    Sinus rhythm, HR in 60s, PVCs - Personally Reviewed  Physical Exam .   GEN: No acute distress currently on 4 L oxygen via [place] Neck: No JVD Cardiac: RRR, no murmurs, rubs, or gallops.  Respiratory: Clear to auscultation bilaterally. GI: Soft, nontender, non-distended  MS: No edema  Assessment & Plan .     Atrial fibrillation with RVR Syncopal episode  Known history of paroxysmal atrial fibrillation  CHA2DS2-VASc Score = 6  Had history of A. Fib with RVR around 4 AM 3/12  Got IV amiodarone bolus  3/12 nurse witnessed a syncopal episode where patient hit her lip after falling off bedside commode -- denies any head trauma  Most recent BP 116/76 HR 64 Was given NS bolus 250 cc x 1  Currently in sinus rhythm with HR in the 60s  Normal LFTs Currently on IV amiodarone -- will discuss with MD about transitioning to PO amiodarone at discharge  and then  sending home with close follow up, made general cardiology follow up for 3/20 with Marjie Skiff, PA-C Continue Eliquis 2.5 mg BID  Continue bisoprolol 5 mg daily Rechecking TSH -- was normal 01/2020  Chest pain Nonobstructive CAD  LHC 3/11 showed patent coronary arteries, mild irregularities but no stenotic lesions, recommended to continue medical therapy  Continue Lipitor 20 mg daily   Continue Zetia 10 mg daily  Discontinued ASA   Chronic HFrEF, NYHA Class II Nonischemic cardiomyopathy  Echo this admission showed: EF 30-35%, global hypokinesis, G1DD, mildly reduced RV function Was hypotensive yesterday and given 250 cc NS bolus  BP today 116/76 Continue bisoprolol 5 mg daily Continue losartan 25 mg daily   Aortic regurgitation s/p AVR 2020 Echo this admission showed normal valve function, mean gradient of 12 with no perivalvular leak   COPD Followed by pulmonology, receiving breathing treatments  Currently on 4 L oxygen, reports improved breathing    For questions or updates, please contact [facility] Please consult [email or web address] for contact info under     Signed, Olena Leatherwood, PA-C  Patient seen and examined, note reviewed with the signed Advanced Practice Provider.  I personally reviewed laboratory data, imaging studies and relevant notes. I independently examined the patient and formulated the important aspects of the plan. I have personally discussed the plan with the patient and/or family. Comments or changes to the note/plan are indicated below.  Seen and examined at her bedside Clinically improved ready for discharge Continue Eliquis 2.5 mg twice a day, Bisoprolol 2.5 mg daily, amiodarone 200 mg daily and losartn 25 mg daily.  Suspect her cardiomyopathy may be tachycardia mediated - in the meantime I will defer to her primary cardiology for further imaging ( cMRI) or evaluation for stable rhythm control    Thomasene Ripple DO, MS [facility] Attending  Cardiologist [facility]  [address] [place], [place] [address] [phone] Website: [email or web address]

## 2023-09-02 NOTE — Progress Notes (Signed)
 Mobility Specialist Progress Note;   09/02/23 0950  Mobility  Activity Ambulated with assistance in room;Ambulated with assistance to bathroom;Transferred from bed to chair  Level of Assistance Contact guard assist, steadying assist  Assistive Device Other (Comment) (HHA)  Distance Ambulated (ft) 35 ft  Activity Response Tolerated well  Mobility Referral Yes  Mobility visit 1 Mobility  Mobility Specialist Start Time (ACUTE ONLY) 0950  Mobility Specialist Stop Time (ACUTE ONLY) 1015  Mobility Specialist Time Calculation (min) (ACUTE ONLY) 25 min   Pt eager for mobility. On 4LO2 upon arrival. Requested assistance to BR, void successful. Required MinG assistance via HHA during ambulation for safety. Transferred pt to chair. VSS on 4LO2. Pt left comfortably in chair with all needs met, alarm on.   Caesar Bookman Mobility Specialist Please contact via SecureChat or [employer] [phone]

## 2023-09-02 NOTE — Progress Notes (Signed)
 Nurse requested Mobility Specialist to perform oxygen saturation test with pt which includes removing pt from oxygen both at rest and while ambulating.  Below are the results from that testing.     Patient Saturations on Room Air at Rest = spO2 93%  Patient Saturations on Room Air while Ambulating = sp02 85% .   Patient Saturations on 4 Liters of oxygen while Ambulating = sp02 90%> .  At end of testing pt left in room on 4  Liters of oxygen.  Reported results to nurse.    Caesar Bookman Mobility Specialist Please contact via SecureChat or [employer] [phone]

## 2023-09-02 NOTE — Discharge Summary (Addendum)
 Discharge Summary    Patient ID: Dana Pruitt MRN: [id number]; DOB: 09-Jul-1940  Admit date: 08/30/2023 Discharge date: 09/02/2023  PCP:  Dana Brunette, MD    [facility] Providers Cardiologist:  Dana Batty, MD   {  Discharge Diagnoses    Principal Problem:   Chest pain Active Problems:   COPD  GOLD II   HLD (hyperlipidemia)   Severe aortic regurgitation   Chronic systolic CHF (congestive heart failure) (HCC)   PAF (paroxysmal atrial fibrillation) (HCC)   Essential hypertension   Chronic respiratory failure with hypoxia (HCC)   S/P AVR (aortic valve replacement)   Thoracic aortic aneurysm (HCC)   Non-ischemic cardiomyopathy (HCC)  Diagnostic Studies/Procedures  Echocardiogram 08/26/2023 IMPRESSIONS   1. Left ventricular ejection fraction, by estimation, is 30 to 35%. The  left ventricle has moderately decreased function. The left ventricle  demonstrates global hypokinesis with prominent septal-lateral dyssynchrony  due to LBBB. There is mild concentric   left ventricular hypertrophy. Left ventricular diastolic parameters are  consistent with Grade I diastolic dysfunction (impaired relaxation).   2. Right ventricular systolic function is mildly reduced. The right  ventricular size is normal. There is normal pulmonary artery systolic  pressure. The estimated right ventricular systolic pressure is 35.9 mmHg.   3. Left atrial size was mildly dilated.   4. The mitral valve is normal in structure. Mild mitral valve  regurgitation. No evidence of mitral stenosis.   5. Bioprosthetic aortic valve, normal function. Mean gradient 12 mmHg, no  perivalvular leakage.   6. Aortic dilatation noted. There is mild dilatation of the ascending  aorta, measuring 43 mm.   7. The inferior vena cava is normal in size with greater than 50%  respiratory variability, suggesting right atrial pressure of 3 mmHg.   RHC/LHC 08/31/2023 1.  Patent coronary arteries with mild  irregularities but no stenotic lesions.  Mild ectasia in the proximal RCA and mid circumflex. 2.  Essentially normal intracardiac hemodynamics with a right atrial mean pressure of 4 mmHg, PA pressure of 33/14 mean 21 mmHg, pulmonary wedge pressure of 5 mmHg, and preserved cardiac output of 3.7 L/min   Plan: med Rx per inpatient cardiology team. OK to resume apixaban tomorrow am (orders written).  _____________   History of Present Illness     Dana Pruitt is a 83 y.o. female with past medical history of hx of mild nonobstructive CAD on heart cath 12/2018, severe AI status post AVR 04/2019, nonischemic cardiomyopathy, heart failure with recovered EF , paroxysmal atrial fibrillation, ascending aortic aneurysm, CVA 2023, COPD followed by pulmonology, hypertension, hyperlipidemia, breast cancer status postchemotherapy/radiation and lumpectomy in 2013.  Her cardiac history includes; having an EF 35-40% in 2015 with moderate AR. She was started on a beta-blocker which lead to normalized EF in January 2018. In July 2020 she was admitted for chest pain where she underwent a RHC/LHC that showed mild nonobstructive CAD with wide aortic pulse pressure suggesting significant AR, preserved cardiac output and low filling pressures. August 2020 she underwent AVR. She developed atrial fibrillation post-op and then continued to have intermittent recurrences of atrial fibrillation. Myoview from February 2022 was low risk. Coronary CTA from July 2023 showed mild (25-49%) plaque in RCA, LAD, LCx with negative FFR.  She has recently been seeing pulmonology for SOB and increasing O2 requirements. Lasix and potassium were prescribed. Outpatient echo 08/26/23 showed recurrent drop in EF to 30-35%, prompting recommendation for outpatient visit. This otherwise showed mild LVH, G1DD, mildly reduced  RV function, normal AVR, mild dilation of ascending aorta. However, in the interim, she presented to [facility] ED via EMS on 08/30/2023  complaining of chest pain. She states the chest pain woke her up from sleeping, radiated down her right neck, shoulder, and to her abdomen. She was given nitroglycerin by EMS which resulted in some improvement.   Hospital Course     Her workup in the ED included a CXR showing emphysema without active disease, negative troponin x 2, BNP 473 (outpatient), creatinine 1.29, hemoglobin 11.8, potassium 4.1. She was admitted to the cardiology service for further workup of chest pain.  During this admission she underwent a R/LHC that showed patent coronary arteries with mild irregularities but no stenotic lesions, with recommendation to continue medical therapy.  The morning of 09/01/23, she went into AFib RVR with rates getting as high as 120-130s. She then developed an episode of syncope associated with hypotension (no VT or pauses on telemetry). She received an IV amiodarone bolus and then was continued on a drip. No head trauma occurred. She was given 250 cc of IV fluids and recovered well with improvement of her bP.  After receiving IV amiodarone she converted to NSR and was maintaining rates in the 50-60s therefore transitioned to oral amiodarone 200mg  daily. She was continued on her Eliquis 2.5 mg BID and started on bisoprolol 5 mg daily (was previously on diltiazem at home but change made due to decreased EF) and continued on home losartan 25 mg daily. She was continued on home Lipitor 20 mg and Zetia 10 mg. Due to current HR above, we will decrease bisoprolol to 2.5mg  daily ay discharge.  Patient was seen morning of 3/13 and was doing much better. She felt less short of breath, continued to deny any chest pain and was feeling much stronger than she was in the past couple of days. She was seen by Dr. Servando Pruitt and deemed medically stable for discharge. Made general cardiology follow up for 3/20 with Dana Skiff, PA-C. As she had mild anemia/thrombocytopenia this admission would also f/u CBC at discharge.  Per Dr. Servando Pruitt, she will defer to primary cardiologist for further need for outpatient testing in regards to nonischemic cardiomyopathy (Dr. Allyson Pruitt). Will also need re-assessment of LVEF in the future to consider whether CRT would be indicated.  Her treatment plan at discharge is as follows:   Paroxysmal atrial fibrillation with episodes of RVR Syncopal episode  Currently in sinus rhythm with HR in the 60s  CHA2DS2-VASc Score = 6  Normal LFTs Continue PO amiodarone 200 mg daily  Continue Eliquis 2.5 mg BID  Continue bisoprolol 2.5 mg daily (reduced dose due to HR) Rechecking TSH before discharge   Chest pain Nonobstructive CAD  Continue Lipitor 20 mg daily   Continue Zetia 10 mg daily    Chronic HFrEF, NYHA Class II Nonischemic cardiomyopathy  Echo this admission showed: EF 30-35%, global hypokinesis, G1DD, mildly reduced RV function Continue bisoprolol 2.5 mg daily (reduced dose due to HR) Continue losartan 25 mg daily  Per Dr. Servando Pruitt, she will defer to primary cardiologist for further need for outpatient testing in regards to nonischemic cardiomyopathy (Dr. Allyson Pruitt)   Aortic regurgitation s/p AVR 2020 Recent echo showed normal valve function, mean gradient of 12 with no perivalvular leak    COPD Followed by pulmonology, receiving breathing treatments  Currently on 4 L oxygen which was PTA requirement, reports improved breathing      Did the patient have an acute coronary syndrome (  MI, NSTEMI, STEMI, etc) this admission?:  No                               Did the patient have a percutaneous coronary intervention (stent / angioplasty)?:  No.        _____________  Discharge Vitals Blood pressure 116/76, pulse 62, temperature 98.5 F (36.9 C), temperature source Oral, resp. rate 13, height 5\' 2"  (1.575 m), weight 53.8 kg, SpO2 95%.  Filed Weights   08/31/23 1345 09/02/23 0500  Weight: 53.3 kg 53.8 kg    Labs & Radiologic Studies    CBC Recent Labs    09/01/23 0425  09/02/23 0451  WBC 9.2 8.4  HGB 12.1 11.1*  HCT 35.9* 32.9*  MCV 97.8 97.6  PLT 161 136*   Basic Metabolic Panel Recent Labs    69/62/95 0425 09/02/23 0451  NA 137 136  K 4.5 4.2  CL 104 105  CO2 26 27  GLUCOSE 128* 124*  BUN 31* 23  CREATININE 1.36* 1.05*  CALCIUM 9.5 9.0   Liver Function Tests No results for input(s): "AST", "ALT", "ALKPHOS", "BILITOT", "PROT", "ALBUMIN" in the last 72 hours. No results for input(s): "LIPASE", "AMYLASE" in the last 72 hours. High Sensitivity Troponin:   Recent Labs  Lab 08/30/23 0928 08/30/23 1103  TROPONINIHS 15 15    BNP Invalid input(s): "POCBNP" D-Dimer No results for input(s): "DDIMER" in the last 72 hours. Hemoglobin A1C No results for input(s): "HGBA1C" in the last 72 hours. Fasting Lipid Panel Recent Labs    08/31/23 0506  CHOL 118  HDL 47  LDLCALC 47  TRIG 118  CHOLHDL 2.5   Thyroid Function Tests No results for input(s): "TSH", "T4TOTAL", "T3FREE", "THYROIDAB" in the last 72 hours.  Invalid input(s): "FREET3" _____________  CARDIAC CATHETERIZATION Result Date: 08/31/2023 1.  Patent coronary arteries with mild irregularities but no stenotic lesions.  Mild ectasia in the proximal RCA and mid circumflex. 2.  Essentially normal intracardiac hemodynamics with a right atrial mean pressure of 4 mmHg, PA pressure of 33/14 mean 21 mmHg, pulmonary wedge pressure of 5 mmHg, and preserved cardiac output of 3.7 L/min Plan: med Rx per inpatient cardiology team. OK to resume apixaban tomorrow am (orders written).   DG Chest Port 1 View Result Date: 08/30/2023 CLINICAL DATA:  Chest pain EXAM: PORTABLE CHEST 1 VIEW COMPARISON:  08/16/2023 FINDINGS: Artifact overlies the chest. Previous median sternotomy and aortic valve replacement. Heart size is normal. Aortic atherosclerotic calcification is seen. There are emphysematous changes of the upper lungs. Scattered areas of pulmonary scarring but no sign of active consolidation,  collapse or effusion. Chronic degenerative changes affect the shoulders. IMPRESSION: Previous median sternotomy and aortic valve replacement. Emphysematous changes of the upper lungs. Scattered areas of pulmonary scarring. No active disease. Electronically Signed   By: Paulina Fusi M.D.   On: 08/30/2023 11:21   ECHOCARDIOGRAM COMPLETE Result Date: 08/26/2023    ECHOCARDIOGRAM REPORT   Patient Name:   Dana Pruitt Date of Exam: 08/26/2023 Medical Rec #:  [id number]       Height:       64.0 in Accession #:    [id number]      Weight:       122.1 lb Date of Birth:  19-Jan-1941      BSA:          1.586 m Patient Age:    56 years  BP:           120/66 mmHg Patient Gender: F               HR:           57 bpm. Exam Location:  [address] Procedure: 2D Echo, Cardiac Doppler and Color Doppler (Both Spectral and Color            Flow Doppler were utilized during procedure). Indications:    Z95.2 S/p AVR                 I50.22 CHF  History:        Patient has prior history of Echocardiogram examinations, most                 recent 12/21/2021. CHF, COPD and Dilated ascending aorta, S/p AVR                 (21mm Edwards inspiris resilia), Arrythmias:Paroxysmal atrial                 fibrillation; Risk Factors:Hypertension, Dyslipidemia and Former                 Smoker.  Sonographer:    Samule Ohm RDCS Referring Phys: [id number] JONATHAN J BERRY IMPRESSIONS  1. Left ventricular ejection fraction, by estimation, is 30 to 35%. The left ventricle has moderately decreased function. The left ventricle demonstrates global hypokinesis with prominent septal-lateral dyssynchrony due to LBBB. There is mild concentric  left ventricular hypertrophy. Left ventricular diastolic parameters are consistent with Grade I diastolic dysfunction (impaired relaxation).  2. Right ventricular systolic function is mildly reduced. The right ventricular size is normal. There is normal pulmonary artery systolic pressure. The estimated right  ventricular systolic pressure is 35.9 mmHg.  3. Left atrial size was mildly dilated.  4. The mitral valve is normal in structure. Mild mitral valve regurgitation. No evidence of mitral stenosis.  5. Bioprosthetic aortic valve, normal function. Mean gradient 12 mmHg, no perivalvular leakage.  6. Aortic dilatation noted. There is mild dilatation of the ascending aorta, measuring 43 mm.  7. The inferior vena cava is normal in size with greater than 50% respiratory variability, suggesting right atrial pressure of 3 mmHg. FINDINGS  Left Ventricle: Left ventricular ejection fraction, by estimation, is 30 to 35%. The left ventricle has moderately decreased function. The left ventricle demonstrates global hypokinesis. The left ventricular internal cavity size was normal in size. There is mild concentric left ventricular hypertrophy. Left ventricular diastolic parameters are consistent with Grade I diastolic dysfunction (impaired relaxation). Right Ventricle: The right ventricular size is normal. No increase in right ventricular wall thickness. Right ventricular systolic function is mildly reduced. There is normal pulmonary artery systolic pressure. The tricuspid regurgitant velocity is 2.87 m/s, and with an assumed right atrial pressure of 3 mmHg, the estimated right ventricular systolic pressure is 35.9 mmHg. Left Atrium: Left atrial size was mildly dilated. Right Atrium: Right atrial size was normal in size. Pericardium: There is no evidence of pericardial effusion. Mitral Valve: The mitral valve is normal in structure. Mild mitral valve regurgitation. No evidence of mitral valve stenosis. Tricuspid Valve: The tricuspid valve is normal in structure. Tricuspid valve regurgitation is mild. Aortic Valve: Bioprosthetic aortic valve. Mean gradient 12 mmHg, no perivalvular leakage. The aortic valve has been repaired/replaced. Aortic valve regurgitation is not visualized. Aortic valve mean gradient measures 12.0 mmHg. Aortic  valve peak gradient  measures 22.6 mmHg. Aortic valve area, by VTI  measures 0.72 cm. Pulmonic Valve: The pulmonic valve was normal in structure. Pulmonic valve regurgitation is trivial. Aorta: The aortic root is normal in size and structure and aortic dilatation noted. There is mild dilatation of the ascending aorta, measuring 43 mm. Venous: The inferior vena cava is normal in size with greater than 50% respiratory variability, suggesting right atrial pressure of 3 mmHg. IAS/Shunts: No atrial level shunt detected by color flow Doppler.  LEFT VENTRICLE PLAX 2D LVIDd:         4.20 cm   Diastology LVIDs:         3.10 cm   LV e' medial:    3.37 cm/s LV PW:         1.10 cm   LV E/e' medial:  12.9 LV IVS:        1.60 cm   LV e' lateral:   5.44 cm/s LVOT diam:     1.90 cm   LV E/e' lateral: 8.0 LV SV:         36 LV SV Index:   23 LVOT Area:     2.84 cm  RIGHT VENTRICLE            IVC RV S prime:     8.05 cm/s  IVC diam: 1.30 cm TAPSE (M-mode): 1.4 cm RVSP:           35.9 mmHg LEFT ATRIUM             Index        RIGHT ATRIUM           Index LA diam:        4.00 cm 2.52 cm/m   RA Pressure: 3.00 mmHg LA Vol (A2C):   47.9 ml 30.19 ml/m  RA Area:     13.00 cm LA Vol (A4C):   37.9 ml 23.89 ml/m  RA Volume:   29.00 ml  18.28 ml/m LA Biplane Vol: 44.0 ml 27.74 ml/m  AORTIC VALVE AV Area (Vmax):    0.72 cm AV Area (Vmean):   0.79 cm AV Area (VTI):     0.72 cm AV Vmax:           237.50 cm/s AV Vmean:          158.000 cm/s AV VTI:            0.498 m AV Peak Grad:      22.6 mmHg AV Mean Grad:      12.0 mmHg LVOT Vmax:         59.90 cm/s LVOT Vmean:        44.300 cm/s LVOT VTI:          0.126 m LVOT/AV VTI ratio: 0.25  AORTA Ao Root diam: 3.60 cm Ao Asc diam:  4.30 cm MITRAL VALVE               TRICUSPID VALVE MV Area (PHT): 1.91 cm    TR Peak grad:   32.9 mmHg MV Decel Time: 398 msec    TR Vmax:        287.00 cm/s MV E velocity: 43.50 cm/s  Estimated RAP:  3.00 mmHg MV A velocity: 61.80 cm/s  RVSP:           35.9 mmHg MV  E/A ratio:  0.70                            SHUNTS  Systemic VTI:  0.13 m                            Systemic Diam: 1.90 cm Dalton McleanMD Electronically signed by Wilfred Lacy Signature Date/Time: 08/26/2023/2:59:42 PM    Final    DG Chest 2 View Result Date: 08/16/2023 CLINICAL DATA:  Short of breath, hypoxia EXAM: CHEST - 2 VIEW COMPARISON:  02/18/2020 FINDINGS: Frontal and lateral views of the chest demonstrate postsurgical changes from CABG and aortic valve replacement. The cardiac silhouette is stable. No acute airspace disease, effusion, or pneumothorax. No acute bony abnormalities. IMPRESSION: 1. Stable chest, no acute process. Electronically Signed   By: Sharlet Pruitt M.D.   On: 08/16/2023 18:56   Disposition   Pt is being discharged home today in good condition per MD.  Follow-up Plans & Appointments    Follow-up Information     Corrin Parker, PA-C Follow up.   Specialty: Cardiology Why: Follow up made 09/09/3023 Contact information: [address] [place] [address] [phone]                Discharge Medications   Allergies as of 09/02/2023   No Known Allergies      Medication List     STOP taking these medications    diltiazem 30 MG tablet Commonly known as: Cardizem   furosemide 20 MG tablet Commonly known as: LASIX   nitrofurantoin 100 MG capsule Commonly known as: MACRODANTIN   potassium chloride 10 MEQ tablet Commonly known as: KLOR-CON M   predniSONE 10 MG tablet Commonly known as: DELTASONE       TAKE these medications    albuterol 108 (90 Base) MCG/ACT inhaler Commonly known as: ProAir HFA 2 puffs every 4 hours as needed only  if your can't catch your breath   albuterol (2.5 MG/3ML) 0.083% nebulizer solution Commonly known as: PROVENTIL Take 3 mLs (2.5 mg total) by nebulization every 6 (six) hours as needed for wheezing or shortness of breath.   amiodarone 200 MG tablet Commonly  known as: PACERONE Take 1 tablet (200 mg total) by mouth daily.   atorvastatin 20 MG tablet Commonly known as: LIPITOR Take 20 mg by mouth daily.   bisoprolol 5 MG tablet Commonly known as: ZEBETA Take 0.5 tablets (2.5 mg total) by mouth daily. Start taking on: September 03, 2023   Breztri Aerosphere 160-9-4.8 MCG/ACT Aero Generic drug: budeson-glycopyrrolate-formoterol INHALE 2 PUFFS INTO THE LUNGS TWICE DAILY   Eliquis 2.5 MG Tabs tablet Generic drug: apixaban TAKE 1 TABLET(2.5 MG) BY MOUTH TWICE DAILY   ezetimibe 10 MG tablet Commonly known as: ZETIA Take 10 mg by mouth daily.   FeroSul 325 (65 Fe) MG tablet Generic drug: ferrous sulfate Take 325 mg by mouth every other day.   fluticasone 50 MCG/ACT nasal spray Commonly known as: FLONASE SHAKE LIQUID AND USE 2 SPRAYS IN EACH NOSTRIL DAILY   guaiFENesin 600 MG 12 hr tablet Commonly known as: MUCINEX Take 1 tablet (600 mg total) by mouth 2 (two) times daily.   ipratropium 0.06 % nasal spray Commonly known as: ATROVENT Place 2 sprays into both nostrils 4 (four) times daily.   losartan 25 MG tablet Commonly known as: COZAAR Take 25 mg by mouth daily.   sertraline 100 MG tablet Commonly known as: ZOLOFT Take 200 mg by mouth daily.   valACYclovir 1000 MG tablet Commonly known as: VALTREX Take 500 mg by mouth as needed.  Vitamin D3 25 MCG (1000 UT) Caps Take 1,000 Units by mouth daily.         Outstanding Labs/Studies   Pending TSH prior to discharge   Duration of Discharge Encounter: APP Time: 25 minutes   Signed, Olena Leatherwood, PA-C 09/02/2023, 11:48 AM

## 2023-09-03 ENCOUNTER — Telehealth: Payer: Self-pay

## 2023-09-03 ENCOUNTER — Other Ambulatory Visit (HOSPITAL_COMMUNITY): Payer: Self-pay

## 2023-09-03 NOTE — Telephone Encounter (Signed)
 Please route to cardiology group as they will be managing her HF and she was just hospitalized. I'm not sure if they want her to remain on 10 meq or adjust the dosing. Thanks.

## 2023-09-03 NOTE — Progress Notes (Addendum)
 Cardiology Office Note:    Date:  09/09/2023   ID:  Dana Pruitt, DOB 06/09/1941, MRN [id number]  PCP:  Merri Brunette, MD  Cardiologist:  Nanetta Batty, MD     Referring MD: Merri Brunette, MD   Chief Complaint: hospital follow-up of chest pain  History of Present Illness:    Dana Pruitt is a 83 y.o. female with a history of mild irregularities but no significant CAD noted on recent cardiac catheterization on 08/31/2023, severe aortic insufficiency s/p AVR in 04/2019, chronic systolic CHF/non-ischemic cardiomyopathy with normalization of EF, paroxysmal atrial fibrillation on Eliquis, mild mitral regurgitation,ascending aorta dilatation,  stroke in 12/2021, COPD with chronic hypoxic respiratory failure on 4L of O2, hypertension, hyperlipidemia, CKD stage III, GERD, anxiety/depression, and breast cancer s/p chemotherapy/radiation and lumpectomy in 2013  who is followed by Dr. Allyson Sabal who presents today for hospital follow-up of chest pain.  Patient was referred to Dr. Allyson Sabal in 2015 after Echo showed LVEF of 35-40% with moderate aortic insufficiency. She was started on beta-blocker for her cardiomyopathy and EF eventually normalized to to 60-65% on repeat Echo in 06/2016. Aortic disease progressed and Echo in 09/2018 showed LVEF of 40-45 with diffuse hypokinesis, grade 1 diastolic dysfunction, and severe AI.She was admitted in 12/2018 with chest pain and shortness of breath. She underwent R/LHC at that time which showed mild non-obstructive CAD, wide aortic pulse pressure suggestive of significant AI, preserved cardiac output, and low filling pressures. Patient ultimately underwent AVR with 21mm Inspiris Edwards valve by Dr. Donata Clay on 01/31/2019. She did develop post-op atrial fibrillation but converted back to sinus rhythm after initiation of Amiodarone. She was discharge on Amiodarone and Coumadin both of which were able to be discontinued later. However, she had recurrent episodes of atrial  fibrillation and was started on Eliquis. Coronary CTA in 12/2021 for further evaluation of dyspnea on exertion and an episode of left arm pain showed a coronary calcium score of 709 (87th percentile for age and sex) with mild (25-49%) plaque in the RCA, LAD, and LCX (FFR negative) as well as as a 4.2 cm ascending aortic aneurysm (stable from prior imaging). CTA also showed signs of pulmonary emphysema and a new 6mm spiculated pulmonary nodule in the anterior right upper lobe for which she was advised to follow-up with Pulmonology.   She was last seen by Dr. Allyson Sabal on 08/03/2023 at which time she reported increasing shortness of breath over the last 6 months. Repeat Echo was ordered. This was completed on 08/26/2023 and showed LVEF of 30-35% with global hypokinesis  but prominent septal-lateral dyssynchrony due to LBBB, mild LVH, and grade 1 diastolic dysfunction as well as mildly reduce RV function, mild MR, stable bioprosthetic aortic valve, and mild ascending aorta dilatation measuring 43mm.   She was admitted from 08/30/2023 to 09/02/2023 for further evaluation of chest pain that woke her up from sleep and radiated down her right neck, shoulder, and to her abdomen. High-sensitivity troponin was negative x2. However, she underwent a R/LHC which showed patent coronary arteries with only mild irregularities and essentially normal intracardiac hemodynamics. On the morning of 3/12, she went into rapid atrial fibrillation with rates as high as the 120s to 130s. She became hypotensive with this and had a syncopal episode while on the bedside commode. No ventricular arrhythmias or pauses were seen on telemetry at this time. She was started on IV Amiodarone and given IV fluids. She converted back to sinus rhythm with this and BP improved.  She was transitioned to PO Amiodarone and started on Bisoprolol (previously on Diltiazem but this was stopped due to decreased EF).   Patient presents today for follow-up. Here with  friend. She states she is not doing well since discharge. Her biggest complain today is significant weakness. She almost fell coming into the office today and had to use a wheelchair. She described decreased PO intake and has not been eating of drinking much. BP is also low in the office today so suspect she may be dehydrated. She denies any fevers. She reports she had dysuria about 1 month ago. She did not have a urinalysis at that time but was prescribed antibiotics but never took them because symptoms resolved. She has a history of complicated UTIs so also wonder if she could have a UTI. Home Health PT/ OT were ordered at discharge but this has not started yet.   She has chronic dyspnea on exertion and was started on supplemental O2 4 weeks prior to recent hospitalization. She has been seen by Pulmonology and was prescribed inhalers which have helped. She states dyspnea has been a little worse the last couple of days but overall sounds stable. No new or worsening orthopnea. No PND or edema. She has been coughing up thick mucus. She has no evidence of volume overload on exam. Given this and recent RHC finding, discussed that dyspnea does not seem cardiac related. Suspect this is due to her COPD.  She denies chest pain or palpitations. She has had some mild lightheadedness/ dizziness with quick position changes. However, no syncope since hospitalization.   EKGs/Labs/Other Studies Reviewed:    The following studies were reviewed:  Echocardiogram 08/26/2023: Impressions: 1. Left ventricular ejection fraction, by estimation, is 30 to 35%. The  left ventricle has moderately decreased function. The left ventricle  demonstrates global hypokinesis with prominent septal-lateral dyssynchrony  due to LBBB. There is mild concentric   left ventricular hypertrophy. Left ventricular diastolic parameters are  consistent with Grade I diastolic dysfunction (impaired relaxation).   2. Right ventricular systolic  function is mildly reduced. The right  ventricular size is normal. There is normal pulmonary artery systolic  pressure. The estimated right ventricular systolic pressure is 35.9 mmHg.   3. Left atrial size was mildly dilated.   4. The mitral valve is normal in structure. Mild mitral valve  regurgitation. No evidence of mitral stenosis.   5. Bioprosthetic aortic valve, normal function. Mean gradient 12 mmHg, no  perivalvular leakage.   6. Aortic dilatation noted. There is mild dilatation of the ascending  aorta, measuring 43 mm.   7. The inferior vena cava is normal in size with greater than 50%  respiratory variability, suggesting right atrial pressure of 3 mmHg.  _______________  Right/ Left Cardiac Catheterization 08/31/2023: 1.  Patent coronary arteries with mild irregularities but no stenotic lesions.  Mild ectasia in the proximal RCA and mid circumflex. 2.  Essentially normal intracardiac hemodynamics with a right atrial mean pressure of 4 mmHg, PA pressure of 33/14 mean 21 mmHg, pulmonary wedge pressure of 5 mmHg, and preserved cardiac output of 3.7 L/min   Plan: med Rx per inpatient cardiology team. OK to resume apixaban tomorrow am (orders written).   Diagnostic Dominance: Right     EKG:  EKG ordered today.  EKG Interpretation Date/Time:  Thursday September 09 2023 15:11:03 EDT Ventricular Rate:  70 PR Interval:  156 QRS Duration:  136 QT Interval:  444 QTC Calculation: 479 R Axis:   -86  Text Interpretation: Normal sinus rhythm Left axis deviation Non-specific intra-ventricular conduction block Minimal voltage criteria for LVH, may be normal variant ( Cornell product ) T wave inversion in leads I and aVL ST depression and biphasic T waves in lead V6 (seen on prior tracing on 08/30/2023) Cannot rule out Septal infarct (cited on or before 09-Sep-2023) Confirmed by Marjie Skiff [id number]) on 09/09/2023 3:22:43 PM    Recent Labs: 08/16/2023: Pro B Natriuretic peptide (BNP)  473.0 08/30/2023: ALT 15 09/02/2023: BUN 23; Creatinine, Ser 1.05; Hemoglobin 11.1; Platelets 136; Potassium 4.2; Sodium 136; TSH 2.008  Recent Lipid Panel    Component Value Date/Time   CHOL 118 08/31/2023 0506   CHOL 197 02/16/2014 0826   TRIG 118 08/31/2023 0506   HDL 47 08/31/2023 0506   HDL 52 02/16/2014 0826   CHOLHDL 2.5 08/31/2023 0506   VLDL 24 08/31/2023 0506   LDLCALC 47 08/31/2023 0506   LDLCALC 127 (H) 02/16/2014 0826    Physical Exam:    Vital Signs: BP (!) 93/54   Ht 5\' 4"  (1.626 m)   Wt 118 lb 6.4 oz (53.7 kg)   LMP  (LMP Unknown)   SpO2 (!) 77%   BMI 20.32 kg/m     Wt Readings from Last 3 Encounters:  09/09/23 118 lb 6.4 oz (53.7 kg)  09/02/23 118 lb 11.2 oz (53.8 kg)  08/27/23 124 lb (56.2 kg)     General: 83 y.o. thin frail Caucasian female in no acute distress. HEENT: Normocephalic and atraumatic. Sclera clear.  Neck: Supple. No JVD. Heart: RRR. No murmurs, gallops, or rubs.  Lungs: On 4L of O2 via nasal cannula. Decreased breath sounds in bases but no wheezes, rhonchi, or rales.  Extremities: No lower extremity edema.  Radial and distal pedal pulses 2+ and equal bilaterally. Skin: Warm and dry. Neuro: No focal deficits. Psych: Normal affect. Responds appropriately.   Assessment:    1. History of chest pain   2. Mild non-obstructive CAD   3. Chronic HFrEF (heart failure with reduced ejection fraction) (HCC)   4. Non-ischemic cardiomyopathy (HCC)   5. Paroxysmal atrial fibrillation (HCC)   6. Severe aortic regurgitation s/p AVR   7. Mild mitral regurgitation   8. Ascending aorta dilatation (HCC)   9. Hypotension, unspecified hypotension type   10. Hyperlipidemia, unspecified hyperlipidemia type   11. Stage 3a chronic kidney disease (HCC)   12. Chronic obstructive pulmonary disease, unspecified COPD type (HCC)   13. Chronic hypoxic respiratory failure (HCC)     Plan:    History of Chest Pain Mild Non-Obstructive CAD Patient has a  history intermittent chest pain. She has had multiple ischemic work-ups with cardiac catheterizations and coronary CTAs in the past which have been unremarkable. She was most recently admitted earlier this month for chest pain. LHC showed mild irregularities but no significant CAD.  - No recurrent chest pain.  - No aspirin due to need for full anticoagulation.  - Continue statin/ Zetia.   Chronic HFrEF Non-Ischemic Cardiomyopathy Recent Echo on 08/26/2023 showed LVEF of 30-35% with global hypokinesis  but prominent septal-lateral dyssynchrony due to LBBB, mild LVH, and grade 1 diastolic dysfunction as well as mildly reduce RV function. RHC during recent admission showed essentially normal intracardiac hemodynamics.  - She continues to have chronic dyspnea on exertion and is on 4L of O2. However, euvolemic on exam.  - She is hypotensive today and is very weak with this. She reports decreased PO intake and has not been eating  or drinking much so concerned she is dehydrated.  - GDMT limited by hypotension. - She is currently on Losartan 25mg  daily and Bisoprolol 2.5mg  daily. She has already taken both of these today. Instructed patient to hold these tomorrow. Based on labs results may hold them even longer.  - Will check CMET today.  - Unclear cause of her cardiomyopathy. Possibly secondary to dyssynchrony from LBBB. She may benefit from referral to [facility] given GDMT is limited by hypotension. She has follow-up scheduled with Dr. Allyson Sabal on 09/22/2023. We can discuss this at that time.   Of note, discussed that I do not think her dyspnea is due to CHF. She has no signs of volume overload and recent RHC showed normal hemodynamics. Dyspnea seems to be from COPD. She reports a productive cough and has been coughing up thick mucus. No wheezing on exam but wonder if she could have a COPD exacerbation. Recommended following up with PCP and Pulmonology.  Paroxysmal Atrial Fibrillation Recent  hospitalization was complicated by atrial fibrillation with RVR. However, she converted with IV Amiodarone.  - Maintaining sinus rhythm.  - Continue Amiodarone 200mg  daily.  - Will hold Bisoprolol for now as above given hypotension.  - Continue Eliquis 5mg  twice daily.  - Will check CBC today given mild anemia and thrombocytopenia noted during recent hospitalization.   Syncope She had a syncopal episode during recent admission earlier this month which occurred in the setting of rapid atrial fibrillation and hypotension. No significant arrhythmias were noted on telemetry at the time.  - No recurrent syncope.  - No additional work-up needed at this time.  Severe Aortic Insufficiency s/p AVR Mild Mitral Regurgitation History of AVR in 2020. Recent Echo on 08/26/2023 stable bioprosthetic aortic vave with normal function and no perivalvular leakage (mean gradient 12 mmHg) as well as mild MR. - Continue SBE prophylaxis.  - Continue routine monitoring with serial Echos.   Ascending Aorta Dilatation Most recent Echo on 08/26/2023 showed mild dilatation of the ascending aorta measuring 43 mm. This is stable from last Echo and coronary CTA in 12/2021.  - Can continue to monitor with serial Echos.  Hypotensive Patient is hypotensive in the office today with BP of 93/54 and then 90/40 on my personal recheck at the end of the visit.  - She is very symptomatic with this with significant weakness.  - She reports decreased PO intake lately so concerned she is dehydrated. She states she is prone to this.  - Will hold home Losartan and Bisoprolol at least for tomorrow. Likely longer based on labs.  - Encouraged her to increase PO intake today.  - Will check CBC to make sure she does not have signs of infection. She has a history of UTI and had some dysuria about 1 month ago. She states she was prescribed antibiotics but never took them because dysuria went away on its own. Advised patient to follow-up with PCP  on this.  - I am concerned that patient may need to go back to the ED based on the weakness she describes. She was feeling a little better at the end of this. However, discussed ED precautions and encouraged her to go to the ED if weakness does not improve or worsens.   Hyperlipidemia Lipid panel on 08/31/2023: Total Cholesterol 118, Triglycerides 118, HDL 47, LDL 47. LDL goal <70. - Continue Lipitor 20mg  daily and Zetia 10mg  daily.   CKD Stage IIIa  Baseline creatinine is around 1.0 to 1.2 -  Will repeat CMET today.   COPD Chronic Hypoxic Respiratory Failure Patient has COPD and is on 4L of O2 all the time at home. O2 requirement is new. She states she was started on this about 6 weeks ago.  - She feels like her dyspnea has been a little worse then usual and reported coughing up thick mucus.  - Do not think current dyspnea is due to CHF base on exam and recent RHC. Recommend following up with PCP and Pulmonology.  Disposition: Follow up in 1-2 weeks. Appointment has already been scheduled with Dr. Allyson Sabal for 09/22/2023.   Signed, Corrin Parker, PA-C    ADDENDUM: After visit as I was about to sign her chart,  I realized SpO2 was documented as 77%. However, she did not look like she was in any acute respiratory distress during our visit so wonder if this was accurate. She had no increased work of breathing and no signs of cyanosis. I called and spoke with patient as soon as realizing what her SpO2 level was documented as. She had just eaten a burger and was feeling better (not as weak). She could not find her home pulse oximeter. She went to look out in her car but could not find it here either. She felt very winded after walking to her car. She was going to call her friend to come over who has a pulse oximeter. Asked her to go ahead and take a home nebulizer treatment. Suspect her baseline O2 level is in the high 80s to low 90s with her COPD. Advised patient that if O2 sat is in the mid 80s,  she can try increase her O2 to 5-6L but would advise she call her Pulmonology office tonight. However, if O2 sats are in the 70s to low 80s, advised her to go to the ED. I explained that I have a very low threshold for her to go to the ED given all of her symptoms. She voiced understanding and thanked me for calling.  Corrin Parker, PA-C 09/09/2023 7:01 PM  [facility]

## 2023-09-03 NOTE — Transitions of Care (Post Inpatient/ED Visit) (Signed)
 09/03/2023  Name: Dana Pruitt MRN: [id number] DOB: 1940-12-13  Today's TOC FU Call Status: Today's TOC FU Call Status:: Successful TOC FU Call Completed TOC FU Call Complete Date: 09/03/23 Patient's Name and Date of Birth confirmed.  Transition Care Management Follow-up Telephone Call Date of Discharge: 09/02/23 Discharge Facility: [facility]) Type of Discharge: Inpatient Admission Primary Inpatient Discharge Diagnosis:: Chest Pain How have you been since you were released from the hospital?: Better Any questions or concerns?: No  Items Reviewed: Did you receive and understand the discharge instructions provided?: Yes Medications obtained,verified, and reconciled?: Yes (Medications Reviewed) Any new allergies since your discharge?: No Dietary orders reviewed?: Yes Type of Diet Ordered:: Low sodium heart healthy Do you have support at home?: Yes People in Home: spouse Name of Support/Comfort Primary Source: [name]  Medications Reviewed Today: Medications Reviewed Today     Reviewed by [facility], RN (Case Manager) on 09/03/23 at 1129  Med List Status: <None>   Medication Order Taking? Sig Documenting Provider Last Dose Status Informant  albuterol (PROAIR HFA) 108 (90 Base) MCG/ACT inhaler [id number] No 2 puffs every 4 hours as needed only  if your can't catch your breath Nyoka Cowden, MD Past Week Active Self, Pharmacy Records  albuterol (PROVENTIL) (2.5 MG/3ML) 0.083% nebulizer solution [id number] No Take 3 mLs (2.5 mg total) by nebulization every 6 (six) hours as needed for wheezing or shortness of breath. Noemi Chapel, NP Past Week Active Self, Pharmacy Records           Med Note Nedra Hai, NICOLE   Mon Aug 30, 2023 11:32 PM) Pt having difficulties with assembly and proper usage  amiodarone (PACERONE) 200 MG tablet [id number]  Take 1 tablet (200 mg total) by mouth daily. Parcells, Therisa Doyne, PA-C  Active   atorvastatin (LIPITOR) 20 MG tablet [id number]  No Take 20 mg by mouth daily.  [provider] 08/29/2023 Morning Active Self, Pharmacy Records           Med Note (COX, HEATHER C   Mon Mar 08, 2017  2:22 PM)    bisoprolol (ZEBETA) 5 MG tablet [id number]  Take 0.5 tablets (2.5 mg total) by mouth daily. Jeronimo Greaves  Active   BREZTRI AEROSPHERE 160-9-4.8 MCG/ACT AERO [id number] No INHALE 2 PUFFS INTO THE LUNGS TWICE DAILY Nyoka Cowden, MD 08/29/2023 Morning Active Self, Pharmacy Records  Cholecalciferol (VITAMIN D3) 25 MCG (1000 UT) CAPS [id number] No Take 1,000 Units by mouth daily. [provider] 08/29/2023 Morning Active Self, Pharmacy Records  ELIQUIS 2.5 MG TABS tablet [id number] No TAKE 1 TABLET(2.5 MG) BY MOUTH TWICE DAILY Runell Gess, MD 08/29/2023  7:00 PM Active Self, Pharmacy Records  ezetimibe (ZETIA) 10 MG tablet [id number] No Take 10 mg by mouth daily. [provider] 08/30/2023 Morning Active Self, Pharmacy Records  FEROSUL 325 (65 Fe) MG tablet [id number] No Take 325 mg by mouth every other day. [provider] Past Week Active Self, Pharmacy Records  fluticasone [facility]) 50 MCG/ACT nasal spray [id number] No SHAKE LIQUID AND USE 2 SPRAYS IN EACH NOSTRIL DAILY Nyoka Cowden, MD 08/29/2023 Evening Active Self, Pharmacy Records  guaiFENesin (MUCINEX) 600 MG 12 hr tablet [id number] No Take 1 tablet (600 mg total) by mouth 2 (two) times daily. Rowe Clack, PA-C 08/29/2023 Evening Active Self, Pharmacy Records  ipratropium (ATROVENT) 0.06 % nasal spray [id number] No Place 2 sprays into both nostrils 4 (four) times daily. Noemi Chapel, NP 08/29/2023 Bedtime  Active Self, Pharmacy Records  losartan (COZAAR) 25 MG tablet [id number] No Take 25 mg by mouth daily. [provider] 08/29/2023 Morning Active Self, Pharmacy Records  sertraline (ZOLOFT) 100 MG tablet [id number] No Take 200 mg by mouth daily. [provider] 08/29/2023 Morning Active Self, Pharmacy Records  valACYclovir (VALTREX)  1000 MG tablet [id number] No Take 500 mg by mouth as needed. [provider] Taking Active Self, Pharmacy Records           Med Note (LEE, NICOLE   Mon Aug 30, 2023 11:44 PM) Last dose unknown             Home Care and Equipment/Supplies: Were Home Health Services Ordered?: NA Any new equipment or medical supplies ordered?: NA  Functional Questionnaire: Do you need assistance with bathing/showering or dressing?: No Do you need assistance with meal preparation?: No Do you need assistance with eating?: No Do you have difficulty maintaining continence: No Do you need assistance with getting out of bed/getting out of a chair/moving?: No Do you have difficulty managing or taking your medications?: No  Follow up appointments reviewed: PCP Follow-up appointment confirmed?: NA Specialist Hospital Follow-up appointment confirmed?: Yes Date of Specialist follow-up appointment?: 09/09/23 Follow-Up Specialty Provider:: Marjie Skiff Do you need transportation to your follow-up appointment?: No Do you understand care options if your condition(s) worsen?: Yes-patient verbalized understanding  SDOH Interventions Today    Flowsheet Row Most Recent Value  SDOH Interventions   Food Insecurity Interventions Intervention Not Indicated  Housing Interventions Intervention Not Indicated  Transportation Interventions Intervention Not Indicated  Utilities Interventions Intervention Not Indicated      Interventions Today    Flowsheet Row Most Recent Value  General Interventions   General Interventions Discussed/Reviewed General Interventions Discussed, General Interventions Reviewed, Doctor Visits  Doctor Visits Discussed/Reviewed Doctor Visits Reviewed  Education Interventions   Education Provided Provided Education  Provided Verbal Education On Medication, When to see the doctor  Pharmacy Interventions   Pharmacy Dicussed/Reviewed Medications and their functions       The  patient has been provided with contact information for the care management team and has been advised to call with any health-related questions or concerns. The patient verbalized understanding with current POC. The patient is directed to their insurance card regarding availability of benefits coverage.   Deidre Ala, BSN, RN [place]  [employer] - [employer] [facility] RN Care Manager [phone]

## 2023-09-07 ENCOUNTER — Encounter (HOSPITAL_COMMUNITY): Payer: Medicare Other

## 2023-09-07 DIAGNOSIS — H34212 Partial retinal artery occlusion, left eye: Secondary | ICD-10-CM | POA: Diagnosis not present

## 2023-09-07 DIAGNOSIS — I428 Other cardiomyopathies: Secondary | ICD-10-CM | POA: Diagnosis not present

## 2023-09-07 DIAGNOSIS — I480 Paroxysmal atrial fibrillation: Secondary | ICD-10-CM | POA: Diagnosis not present

## 2023-09-07 DIAGNOSIS — Z952 Presence of prosthetic heart valve: Secondary | ICD-10-CM | POA: Diagnosis not present

## 2023-09-07 DIAGNOSIS — I110 Hypertensive heart disease with heart failure: Secondary | ICD-10-CM | POA: Diagnosis not present

## 2023-09-07 DIAGNOSIS — I2510 Atherosclerotic heart disease of native coronary artery without angina pectoris: Secondary | ICD-10-CM | POA: Diagnosis not present

## 2023-09-07 DIAGNOSIS — Z7951 Long term (current) use of inhaled steroids: Secondary | ICD-10-CM | POA: Diagnosis not present

## 2023-09-07 DIAGNOSIS — M8580 Other specified disorders of bone density and structure, unspecified site: Secondary | ICD-10-CM | POA: Diagnosis not present

## 2023-09-07 DIAGNOSIS — Z9981 Dependence on supplemental oxygen: Secondary | ICD-10-CM | POA: Diagnosis not present

## 2023-09-07 DIAGNOSIS — J441 Chronic obstructive pulmonary disease with (acute) exacerbation: Secondary | ICD-10-CM | POA: Diagnosis not present

## 2023-09-07 DIAGNOSIS — J309 Allergic rhinitis, unspecified: Secondary | ICD-10-CM | POA: Diagnosis not present

## 2023-09-07 DIAGNOSIS — Z7901 Long term (current) use of anticoagulants: Secondary | ICD-10-CM | POA: Diagnosis not present

## 2023-09-07 DIAGNOSIS — I447 Left bundle-branch block, unspecified: Secondary | ICD-10-CM | POA: Diagnosis not present

## 2023-09-07 DIAGNOSIS — Z79899 Other long term (current) drug therapy: Secondary | ICD-10-CM | POA: Diagnosis not present

## 2023-09-07 DIAGNOSIS — I081 Rheumatic disorders of both mitral and tricuspid valves: Secondary | ICD-10-CM | POA: Diagnosis not present

## 2023-09-07 DIAGNOSIS — I712 Thoracic aortic aneurysm, without rupture, unspecified: Secondary | ICD-10-CM | POA: Diagnosis not present

## 2023-09-07 DIAGNOSIS — Z5982 Transportation insecurity: Secondary | ICD-10-CM | POA: Diagnosis not present

## 2023-09-07 DIAGNOSIS — Z87891 Personal history of nicotine dependence: Secondary | ICD-10-CM | POA: Diagnosis not present

## 2023-09-07 DIAGNOSIS — I5042 Chronic combined systolic (congestive) and diastolic (congestive) heart failure: Secondary | ICD-10-CM | POA: Diagnosis not present

## 2023-09-07 DIAGNOSIS — Z9181 History of falling: Secondary | ICD-10-CM | POA: Diagnosis not present

## 2023-09-07 DIAGNOSIS — Z853 Personal history of malignant neoplasm of breast: Secondary | ICD-10-CM | POA: Diagnosis not present

## 2023-09-07 DIAGNOSIS — E785 Hyperlipidemia, unspecified: Secondary | ICD-10-CM | POA: Diagnosis not present

## 2023-09-07 DIAGNOSIS — J9621 Acute and chronic respiratory failure with hypoxia: Secondary | ICD-10-CM | POA: Diagnosis not present

## 2023-09-08 NOTE — Progress Notes (Signed)
 Pulmonary Individual Treatment Plan  Patient Details  Name: Dana Pruitt MRN: [id number] Date of Birth: 05-Sep-1940 Referring Provider:   Doristine Devoid Pulmonary Rehab Walk Test from 07/26/2023 in [facility]  Referring Provider Dr. Sherene Sires       Initial Encounter Date:  Flowsheet Row Pulmonary Rehab Walk Test from 07/26/2023 in [facility]  Date 07/26/23       Visit Diagnosis: Stage 2 moderate COPD by GOLD classification (HCC)  Patient's Home Medications on Admission:   Current Outpatient Medications:    albuterol (PROAIR HFA) 108 (90 Base) MCG/ACT inhaler, 2 puffs every 4 hours as needed only  if your can't catch your breath, Disp: 1 each, Rfl: 6   albuterol (PROVENTIL) (2.5 MG/3ML) 0.083% nebulizer solution, Take 3 mLs (2.5 mg total) by nebulization every 6 (six) hours as needed for wheezing or shortness of breath., Disp: 75 mL, Rfl: 5   amiodarone (PACERONE) 200 MG tablet, Take 1 tablet (200 mg total) by mouth daily., Disp: 90 tablet, Rfl: 3   atorvastatin (LIPITOR) 20 MG tablet, Take 20 mg by mouth daily. , Disp: , Rfl:    bisoprolol (ZEBETA) 5 MG tablet, Take 0.5 tablets (2.5 mg total) by mouth daily., Disp: 15 tablet, Rfl: 3   BREZTRI AEROSPHERE 160-9-4.8 MCG/ACT AERO, INHALE 2 PUFFS INTO THE LUNGS TWICE DAILY, Disp: 10.7 g, Rfl: 11   Cholecalciferol (VITAMIN D3) 25 MCG (1000 UT) CAPS, Take 1,000 Units by mouth daily., Disp: , Rfl:    ELIQUIS 2.5 MG TABS tablet, TAKE 1 TABLET(2.5 MG) BY MOUTH TWICE DAILY, Disp: 180 tablet, Rfl: 1   ezetimibe (ZETIA) 10 MG tablet, Take 10 mg by mouth daily., Disp: , Rfl:    FEROSUL 325 (65 Fe) MG tablet, Take 325 mg by mouth every other day., Disp: , Rfl:    fluticasone (FLONASE) 50 MCG/ACT nasal spray, SHAKE LIQUID AND USE 2 SPRAYS IN EACH NOSTRIL DAILY, Disp: 16 g, Rfl: 3   guaiFENesin (MUCINEX) 600 MG 12 hr tablet, Take 1 tablet  (600 mg total) by mouth 2 (two) times daily., Disp: 30 tablet, Rfl: 0   ipratropium (ATROVENT) 0.06 % nasal spray, Place 2 sprays into both nostrils 4 (four) times daily., Disp: 15 mL, Rfl: 12   losartan (COZAAR) 25 MG tablet, Take 25 mg by mouth daily., Disp: , Rfl:    sertraline (ZOLOFT) 100 MG tablet, Take 200 mg by mouth daily., Disp: , Rfl:    valACYclovir (VALTREX) 1000 MG tablet, Take 500 mg by mouth as needed., Disp: , Rfl:  No current facility-administered medications for this encounter.  Facility-Administered Medications Ordered in Other Encounters:    sodium chloride flush (NS) 0.9 % injection 3 mL, 3 mL, Intravenous, Q12H, Laurey Morale, MD  Past Medical History: Past Medical History:  Diagnosis Date   Anemia    "several times over the years" (10/17/2012)   Anxiety    Breast cancer (HCC) 02/2010   right breast   CAD (coronary artery disease)    a. LHC 12/2018: mild non-obstructive CAD   Carcinoma of breast treated with adjuvant hormone therapy (HCC) 02/2010   Femara   Chronic systolic CHF    a. Echo in 2015: LVEF of 35-40%, b. Echo in 12/2021: LVEF of 60-65%   COPD (chronic obstructive pulmonary disease) (HCC)    Depression    GERD (gastroesophageal reflux disease)    diet controlled,  no med   Hollenhorst plaque, left eye    apparently resolved at recent ophthalmology visit   Hyperlipidemia    Hypertension    IDC, RightStage II, 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   Moderate aortic insufficiency    Non-ischemic cardiomyopathy (HCC)    Paroxysmal atrial fibrillation (HCC)    Pneumonia    "that's why I'm here" (10/17/2012)   S/P AVR (aortic valve replacement) 04/26/2019   Stroke (HCC)    12/2021    Tobacco Use: Social History   Tobacco Use  Smoking Status Former   Current packs/day: 0.00   Average packs/day: 3.0 packs/day for 32.1 years (96.3 ttl pk-yrs)   Types: Cigarettes   Start date: 05/19/1958   Quit date: 06/30/1990   Years since quitting: 33.2  Smokeless  Tobacco Never  Tobacco Comments   Stopped smoking for 2-3 years during second child.     Labs: Review Flowsheet  More data exists      Latest Ref Rng & Units 02/01/2019 02/02/2019 12/20/2021 12/21/2021 08/31/2023  Labs for ITP Cardiac and Pulmonary Rehab  Cholestrol 0 - 200 mg/dL - - - 086  578   LDL (calc) 0 - 99 mg/dL - - - 69  47   HDL-C >46 mg/dL - - - 41  47   Trlycerides <150 mg/dL - - - 73  962   Hemoglobin A1c 4.8 - 5.6 % - - 5.9  - -  PH, Arterial 7.35 - 7.45 7.317  7.310  7.356  - - - 7.404   PCO2 arterial 32 - 48 mmHg 41.0  43.5  36.7  - - - 42.1   Bicarbonate 20.0 - 28.0 mmol/L 20.0 - 28.0 mmol/L 21.1  22.0  20.7  - - - 26.3  28.5   TCO2 22 - 32 mmol/L 22 - 32 mmol/L 22  23  22   - 22  - 28  30   Acid-base deficit 0.0 - 2.0 mmol/L 5.0  4.0  5.0  - - - -  O2 Saturation % % 89.0  93.0  90.0  63.6  - - 94  60     Details       Multiple values from one day are sorted in reverse-chronological order         Capillary Blood Glucose: Lab Results  Component Value Date   GLUCAP 123 (H) 09/01/2023   GLUCAP 151 (H) 02/04/2019   GLUCAP 98 02/04/2019   GLUCAP 95 02/04/2019   GLUCAP 130 (H) 02/03/2019     Pulmonary Assessment Scores:  Pulmonary Assessment Scores     Row Name 07/26/23 1204         ADL UCSD   ADL Phase Entry     SOB Score total 52       CAT Score   CAT Score 24       mMRC Score   mMRC Score 2             UCSD: Self-administered rating of dyspnea associated with activities of daily living (ADLs) 6-point scale (0 = "not at all" to 5 = "maximal or unable to do because of breathlessness")  Scoring Scores range from 0 to 120.  Minimally important difference is 5 units  CAT: CAT can identify the health impairment of COPD patients and is better correlated with disease progression.  CAT has a scoring range of zero to 40. The CAT score is classified into four groups of low (less than 10), medium (10 - 20), high (21-30)  and very high (31-40) based  on the impact level of disease on health status. A CAT score over 10 suggests significant symptoms.  A worsening CAT score could be explained by an exacerbation, poor medication adherence, poor inhaler technique, or progression of COPD or comorbid conditions.  CAT MCID is 2 points  mMRC: mMRC (Modified Medical Research Council) Dyspnea Scale is used to assess the degree of baseline functional disability in patients of respiratory disease due to dyspnea. No minimal important difference is established. A decrease in score of 1 point or greater is considered a positive change.   Pulmonary Function Assessment:  Pulmonary Function Assessment - 07/26/23 1129       Breath   Bilateral Breath Sounds Rhonchi    Shortness of Breath Yes;Limiting activity             Exercise Target Goals: Exercise Program Goal: Individual exercise prescription set using results from initial 6 min walk test and THRR while considering  patient's activity barriers and safety.   Exercise Prescription Goal: Initial exercise prescription builds to 30-45 minutes a day of aerobic activity, 2-3 days per week.  Home exercise guidelines will be given to patient during program as part of exercise prescription that the participant will acknowledge.  Activity Barriers & Risk Stratification:  Activity Barriers & Cardiac Risk Stratification - 07/26/23 1127       Activity Barriers & Cardiac Risk Stratification   Activity Barriers Deconditioning;Muscular Weakness;Shortness of Breath;History of Falls    Cardiac Risk Stratification Moderate             6 Minute Walk:  6 Minute Walk     Row Name 07/26/23 1351         6 Minute Walk   Phase Initial     Distance 1080 feet     Walk Time 6 minutes     # of Rest Breaks 2     MPH 2.05     METS 2.41     RPE 14     Perceived Dyspnea  3     VO2 Peak 8.45     Symptoms No     Resting HR 75 bpm     Resting BP 140/78     Resting Oxygen Saturation  92 %     Exercise  Oxygen Saturation  during 6 min walk 75 %     Max Ex. HR 89 bpm     Max Ex. BP 180/96     2 Minute Post BP 180/90       Interval HR   1 Minute HR 77     2 Minute HR 76     3 Minute HR 89     4 Minute HR 87     5 Minute HR 89     6 Minute HR 87     2 Minute Post HR 67     Interval Heart Rate? Yes       Interval Oxygen   Interval Oxygen? Yes     Baseline Oxygen Saturation % 92 %     1 Minute Oxygen Saturation % 75 %  stopped 1:10-1:26, placed on 2L     1 Minute Liters of Oxygen 0 L     2 Minute Oxygen Saturation % 90 %     2 Minute Liters of Oxygen 2 L     3 Minute Oxygen Saturation % 82 %  stopped 2:40-2:50, placed on 3L     3 Minute Liters of  Oxygen 3 L     4 Minute Oxygen Saturation % 94 %     4 Minute Liters of Oxygen 3 L     5 Minute Oxygen Saturation % 92 %     5 Minute Liters of Oxygen 3 L     6 Minute Oxygen Saturation % 90 %     6 Minute Liters of Oxygen 3 L     2 Minute Post Oxygen Saturation % 100 %     2 Minute Post Liters of Oxygen 3 L              Oxygen Initial Assessment:  Oxygen Initial Assessment - 07/26/23 1128       Home Oxygen   Home Oxygen Device None    Sleep Oxygen Prescription None    Home Exercise Oxygen Prescription None    Home Resting Oxygen Prescription None      Initial 6 min Walk   Oxygen Used Continuous    Liters per minute 3      Program Oxygen Prescription   Program Oxygen Prescription Continuous    Liters per minute 3      Intervention   Short Term Goals To learn and exhibit compliance with exercise, home and travel O2 prescription;To learn and understand importance of monitoring SPO2 with pulse oximeter and demonstrate accurate use of the pulse oximeter.;To learn and understand importance of maintaining oxygen saturations>88%;To learn and demonstrate proper pursed lip breathing techniques or other breathing techniques. ;To learn and demonstrate proper use of respiratory medications    Long  Term Goals Exhibits compliance  with exercise, home  and travel O2 prescription;Maintenance of O2 saturations>88%;Compliance with respiratory medication;Verbalizes importance of monitoring SPO2 with pulse oximeter and return demonstration;Exhibits proper breathing techniques, such as pursed lip breathing or other method taught during program session;Demonstrates proper use of MDI's             Oxygen Re-Evaluation:  Oxygen Re-Evaluation     Row Name 08/04/23 1411 09/02/23 1246           Program Oxygen Prescription   Program Oxygen Prescription Continuous Continuous      Liters per minute 3 4        Home Oxygen   Home Oxygen Device None Portable Concentrator;Home Concentrator      Sleep Oxygen Prescription None Pulsed;Continuous      Liters per minute -- 4      Home Exercise Oxygen Prescription None Pulsed      Liters per minute -- 4      Home Resting Oxygen Prescription None Pulsed      Liters per minute -- 4        Goals/Expected Outcomes   Short Term Goals To learn and exhibit compliance with exercise, home and travel O2 prescription;To learn and understand importance of monitoring SPO2 with pulse oximeter and demonstrate accurate use of the pulse oximeter.;To learn and understand importance of maintaining oxygen saturations>88%;To learn and demonstrate proper pursed lip breathing techniques or other breathing techniques. ;To learn and demonstrate proper use of respiratory medications To learn and exhibit compliance with exercise, home and travel O2 prescription;To learn and understand importance of monitoring SPO2 with pulse oximeter and demonstrate accurate use of the pulse oximeter.;To learn and understand importance of maintaining oxygen saturations>88%;To learn and demonstrate proper pursed lip breathing techniques or other breathing techniques. ;To learn and demonstrate proper use of respiratory medications      Long  Term Goals Exhibits compliance with exercise,  home  and travel O2 prescription;Maintenance  of O2 saturations>88%;Compliance with respiratory medication;Verbalizes importance of monitoring SPO2 with pulse oximeter and return demonstration;Exhibits proper breathing techniques, such as pursed lip breathing or other method taught during program session;Demonstrates proper use of MDI's Exhibits compliance with exercise, home  and travel O2 prescription;Maintenance of O2 saturations>88%;Compliance with respiratory medication;Verbalizes importance of monitoring SPO2 with pulse oximeter and return demonstration;Exhibits proper breathing techniques, such as pursed lip breathing or other method taught during program session;Demonstrates proper use of MDI's      Comments -- needing 4L      Goals/Expected Outcomes Compliance and understanding of oxygen saturation monitoring and breathing techniques to decrease shortness of breath. Compliance and understanding of oxygen saturation monitoring and breathing techniques to decrease shortness of breath.               Oxygen Discharge (Final Oxygen Re-Evaluation):  Oxygen Re-Evaluation - 09/02/23 1246       Program Oxygen Prescription   Program Oxygen Prescription Continuous    Liters per minute 4      Home Oxygen   Home Oxygen Device Portable Concentrator;Home Concentrator    Sleep Oxygen Prescription Pulsed;Continuous    Liters per minute 4    Home Exercise Oxygen Prescription Pulsed    Liters per minute 4    Home Resting Oxygen Prescription Pulsed    Liters per minute 4      Goals/Expected Outcomes   Short Term Goals To learn and exhibit compliance with exercise, home and travel O2 prescription;To learn and understand importance of monitoring SPO2 with pulse oximeter and demonstrate accurate use of the pulse oximeter.;To learn and understand importance of maintaining oxygen saturations>88%;To learn and demonstrate proper pursed lip breathing techniques or other breathing techniques. ;To learn and demonstrate proper use of respiratory  medications    Long  Term Goals Exhibits compliance with exercise, home  and travel O2 prescription;Maintenance of O2 saturations>88%;Compliance with respiratory medication;Verbalizes importance of monitoring SPO2 with pulse oximeter and return demonstration;Exhibits proper breathing techniques, such as pursed lip breathing or other method taught during program session;Demonstrates proper use of MDI's    Comments needing 4L    Goals/Expected Outcomes Compliance and understanding of oxygen saturation monitoring and breathing techniques to decrease shortness of breath.             Initial Exercise Prescription:  Initial Exercise Prescription - 07/26/23 1300       Date of Initial Exercise RX and Referring Provider   Date 07/26/23    Referring Provider Dr. Sherene Sires    Expected Discharge Date 10/21/23      Oxygen   Oxygen Continuous    Liters 3    Maintain Oxygen Saturation 88% or higher      NuStep   Level 1    SPM 75    Minutes 15    METs 2      Track   Laps 10    Minutes 15    METs 2.54      Prescription Details   Frequency (times per week) 2    Duration Progress to 30 minutes of continuous aerobic without signs/symptoms of physical distress      Intensity   THRR 40-80% of Max Heartrate 56-111    Ratings of Perceived Exertion 11-13    Perceived Dyspnea 0-4      Progression   Progression Continue to progress workloads to maintain intensity without signs/symptoms of physical distress.      Resistance Training  Training Prescription Yes    Weight red bands    Reps 10-15             Perform Capillary Blood Glucose checks as needed.  Exercise Prescription Changes:   Exercise Prescription Changes     Row Name 08/10/23 1500 08/24/23 1500 08/26/23 0959         Response to Exercise   Blood Pressure (Admit) 100/68 128/70 118/70     Blood Pressure (Exercise) 120/68 130/70 --     Blood Pressure (Exit) 112/64 102/58 106/62     Heart Rate (Admit) 69 bpm 66 bpm 70  bpm     Heart Rate (Exercise) 95 bpm 81 bpm 89 bpm     Heart Rate (Exit) 73 bpm 69 bpm 66 bpm     Oxygen Saturation (Admit) 92 % 96 % 96 %     Oxygen Saturation (Exercise) 93 % 90 % 92 %     Oxygen Saturation (Exit) 90 % 93 % 95 %     Rating of Perceived Exertion (Exercise) 13 11 13      Perceived Dyspnea (Exercise) 1 1 3      Duration Continue with 30 min of aerobic exercise without signs/symptoms of physical distress. Continue with 30 min of aerobic exercise without signs/symptoms of physical distress. Continue with 30 min of aerobic exercise without signs/symptoms of physical distress.     Intensity THRR unchanged THRR unchanged THRR unchanged       Progression   Progression Continue to progress workloads to maintain intensity without signs/symptoms of physical distress. Continue to progress workloads to maintain intensity without signs/symptoms of physical distress. Continue to progress workloads to maintain intensity without signs/symptoms of physical distress.       Resistance Training   Training Prescription Yes Yes Yes     Weight red bands red bands blue bands     Reps 10-15 10-15 10-15     Time 10 Minutes 10 Minutes 10 Minutes       Oxygen   Oxygen Continuous Continuous Continuous     Liters 3-4 4 4        Treadmill   MPH 2 2 2.1     Grade 0 0 1     Minutes 15 15 15      METs 2.3 2.3 2.8       NuStep   Level 2 2 2      SPM -- -- 70     Minutes 15 15 15      METs 2.1 2.7 2.6       Oxygen   Maintain Oxygen Saturation 88% or higher 88% or higher 88% or higher              Exercise Comments:   Exercise Comments     Row Name 08/05/23 1525           Exercise Comments Katurah completed first day of Pulmonary Rehab. She exercised for 15 min on the track and Nustep. Reannah averaged 2.57 METs on the track and 2.0 METs at level 2 on the Nustep. She performed the warmup and cooldown standing holding onto a chair for balance. Discussed METs.                Exercise  Goals and Review:   Exercise Goals     Row Name 07/26/23 1128             Exercise Goals   Increase Physical Activity Yes       Intervention Provide  advice, education, support and counseling about physical activity/exercise needs.;Develop an individualized exercise prescription for aerobic and resistive training based on initial evaluation findings, risk stratification, comorbidities and participant's personal goals.       Expected Outcomes Short Term: Attend rehab on a regular basis to increase amount of physical activity.;Long Term: Add in home exercise to make exercise part of routine and to increase amount of physical activity.;Long Term: Exercising regularly at least 3-5 days a week.       Increase Strength and Stamina Yes       Intervention Provide advice, education, support and counseling about physical activity/exercise needs.;Develop an individualized exercise prescription for aerobic and resistive training based on initial evaluation findings, risk stratification, comorbidities and participant's personal goals.       Expected Outcomes Short Term: Increase workloads from initial exercise prescription for resistance, speed, and METs.;Short Term: Perform resistance training exercises routinely during rehab and add in resistance training at home;Long Term: Improve cardiorespiratory fitness, muscular endurance and strength as measured by increased METs and functional capacity ( )       Able to understand and use rate of perceived exertion (RPE) scale Yes       Intervention Provide education and explanation on how to use RPE scale       Expected Outcomes Short Term: Able to use RPE daily in rehab to express subjective intensity level;Long Term:  Able to use RPE to guide intensity level when exercising independently       Able to understand and use Dyspnea scale Yes       Intervention Provide education and explanation on how to use Dyspnea scale       Expected Outcomes Short Term: Able to  use Dyspnea scale daily in rehab to express subjective sense of shortness of breath during exertion;Long Term: Able to use Dyspnea scale to guide intensity level when exercising independently       Knowledge and understanding of Target Heart Rate Range (THRR) Yes       Intervention Provide education and explanation of THRR including how the numbers were predicted and where they are located for reference       Expected Outcomes Short Term: Able to state/look up THRR;Long Term: Able to use THRR to govern intensity when exercising independently;Short Term: Able to use daily as guideline for intensity in rehab       Understanding of Exercise Prescription Yes       Intervention Provide education, explanation, and written materials on patient's individual exercise prescription       Expected Outcomes Short Term: Able to explain program exercise prescription;Long Term: Able to explain home exercise prescription to exercise independently                Exercise Goals Re-Evaluation :  Exercise Goals Re-Evaluation     Row Name 08/04/23 1411 09/02/23 1237           Exercise Goal Re-Evaluation   Exercise Goals Review Increase Physical Activity;Increase Strength and Stamina;Able to understand and use rate of perceived exertion (RPE) scale;Knowledge and understanding of Target Heart Rate Range (THRR);Understanding of Exercise Prescription;Able to understand and use Dyspnea scale Increase Physical Activity;Increase Strength and Stamina;Able to understand and use rate of perceived exertion (RPE) scale;Knowledge and understanding of Target Heart Rate Range (THRR);Understanding of Exercise Prescription;Able to understand and use Dyspnea scale      Comments Pt will begin exercise 08/05/23. Will monitor and progress as tolerated. Pt has completed 4 exercise sessions. She has  missed multiple sessions for illness and is currently in the hospital for HF/Afib. She is exercising on the recumbent stepper for 15 min,  level 2, METs 2.7. She is also walking on the treadmill for 15 min, 2.1 mph, 1% incline, METs 2.8. She has been doing fair with exercise in the program. Performs warm up and cool down without limitations. Using blue bands now. She will be out for a couple of weeks. Will progress as able.      Expected Outcomes Through exercise at rehab and home, the patient will decrease shortness of breath of daily activities and feel confident in carrying out an exercise regimen at home. Through exercise at rehab and home, the patient will decrease shortness of breath of daily activities and feel confident in carrying out an exercise regimen at home.               Discharge Exercise Prescription (Final Exercise Prescription Changes):  Exercise Prescription Changes - 08/26/23 0959       Response to Exercise   Blood Pressure (Admit) 118/70    Blood Pressure (Exit) 106/62    Heart Rate (Admit) 70 bpm    Heart Rate (Exercise) 89 bpm    Heart Rate (Exit) 66 bpm    Oxygen Saturation (Admit) 96 %    Oxygen Saturation (Exercise) 92 %    Oxygen Saturation (Exit) 95 %    Rating of Perceived Exertion (Exercise) 13    Perceived Dyspnea (Exercise) 3    Duration Continue with 30 min of aerobic exercise without signs/symptoms of physical distress.    Intensity THRR unchanged      Progression   Progression Continue to progress workloads to maintain intensity without signs/symptoms of physical distress.      Resistance Training   Training Prescription Yes    Weight blue bands    Reps 10-15    Time 10 Minutes      Oxygen   Oxygen Continuous    Liters 4      Treadmill   MPH 2.1    Grade 1    Minutes 15    METs 2.8      NuStep   Level 2    SPM 70    Minutes 15    METs 2.6      Oxygen   Maintain Oxygen Saturation 88% or higher             Nutrition:  Target Goals: Understanding of nutrition guidelines, daily intake of sodium 1500mg , cholesterol 200mg , calories 30% from fat and 7% or less  from saturated fats, daily to have 5 or more servings of fruits and vegetables.  Biometrics:    Nutrition Therapy Plan and Nutrition Goals:  Nutrition Therapy & Goals - 09/03/23 0852       Nutrition Therapy   Diet General Healthy Diet    Drug/Food Interactions Statins/Certain Fruits      Personal Nutrition Goals   Nutrition Goal Patient to identify strategies for weight maintenance/weight gain of 0.5-2.0# per week.    Comments Katlynne has medical history of CHF, COPD2, HTN, hx of TAVR, hyperlipidemia, pulmonary HTN. She has not attended pulmonary rehab since 08/26/23 due to hospitalization/right heart cath. Her BMI is low for age; however, she has maintained her weight over the last year. Will continue to monitor weight upon patient's return to rehab program. Lipids and LDL remain at goal. Patient will benefit from participation in pulmonary rehab for nutrition, exercise, and lifestyle modification support.  Intervention Plan   Intervention Nutrition handout(s) given to patient.;Prescribe, educate and counsel regarding individualized specific dietary modifications aiming towards targeted core components such as weight, hypertension, lipid management, diabetes, heart failure and other comorbidities.    Expected Outcomes Short Term Goal: Understand basic principles of dietary content, such as calories, fat, sodium, cholesterol and nutrients.;Long Term Goal: Adherence to prescribed nutrition plan.             Nutrition Assessments:  MEDIFICTS Score Key: >=70 Need to make dietary changes  40-70 Heart Healthy Diet <= 40 Therapeutic Level Cholesterol Diet   Picture Your Plate Scores: <16 Unhealthy dietary pattern with much room for improvement. 41-50 Dietary pattern unlikely to meet recommendations for good health and room for improvement. 51-60 More healthful dietary pattern, with some room for improvement.  >60 Healthy dietary pattern, although there may be some specific  behaviors that could be improved.    Nutrition Goals Re-Evaluation:  Nutrition Goals Re-Evaluation     Row Name 09/03/23 1096             Goals   Current Weight 124 lb 5.4 oz (56.4 kg)  weight from last attended session on 08/26/23       Comment GFR 53, Cr 1.05, lipids WNL, LDL 47 (zetia, lipitor)       Expected Outcome Fujie has medical history of CHF, COPD2, HTN, hx of TAVR, hyperlipidemia, pulmonary HTN. She has not attended pulmonary rehab since 08/26/23 due to hospitalization/right heart cath. Her BMI is low for age; however, she has maintained her weight over the last year. Will continue to monitor weight upon patient's return to rehab program. Lipids and LDL remain at goal. Patient will benefit from participation in pulmonary rehab for nutrition, exercise, and lifestyle modification support.                Nutrition Goals Discharge (Final Nutrition Goals Re-Evaluation):  Nutrition Goals Re-Evaluation - 09/03/23 0852       Goals   Current Weight 124 lb 5.4 oz (56.4 kg)   weight from last attended session on 08/26/23   Comment GFR 53, Cr 1.05, lipids WNL, LDL 47 (zetia, lipitor)    Expected Outcome Apolonia has medical history of CHF, COPD2, HTN, hx of TAVR, hyperlipidemia, pulmonary HTN. She has not attended pulmonary rehab since 08/26/23 due to hospitalization/right heart cath. Her BMI is low for age; however, she has maintained her weight over the last year. Will continue to monitor weight upon patient's return to rehab program. Lipids and LDL remain at goal. Patient will benefit from participation in pulmonary rehab for nutrition, exercise, and lifestyle modification support.             Psychosocial: Target Goals: Acknowledge presence or absence of significant depression and/or stress, maximize coping skills, provide positive support system. Participant is able to verbalize types and ability to use techniques and skills needed for reducing stress and depression.  Initial  Review & Psychosocial Screening:  Initial Psych Review & Screening - 07/26/23 1123       Initial Review   Current issues with Current Depression;Current Psychotropic Meds;Current Stress Concerns    Source of Stress Concerns Chronic Illness;Unable to participate in former interests or hobbies    Comments Arelys states she feels depressed some days. She is also dealing with the stress of taking care of her husband and dealing with her own health conditions.      Family Dynamics   Good Support System? Yes  Barriers   Psychosocial barriers to participate in program Psychosocial barriers identified (see note);The patient should benefit from training in stress management and relaxation.      Screening Interventions   Interventions Encouraged to exercise;To provide support and resources with identified psychosocial needs    Expected Outcomes Short Term goal: Identification and review with participant of any Quality of Life or Depression concerns found by scoring the questionnaire.;Long Term goal: The participant improves quality of Life and PHQ9 Scores as seen by post scores and/or verbalization of changes;Short Term goal: Utilizing psychosocial counselor, staff and physician to assist with identification of specific Stressors or current issues interfering with healing process. Setting desired goal for each stressor or current issue identified.;Long Term Goal: Stressors or current issues are controlled or eliminated.             Quality of Life Scores:  Scores of 19 and below usually indicate a poorer quality of life in these areas.  A difference of  2-3 points is a clinically meaningful difference.  A difference of 2-3 points in the total score of the Quality of Life Index has been associated with significant improvement in overall quality of life, self-image, physical symptoms, and general health in studies assessing change in quality of life.  PHQ-9: Review Flowsheet       07/26/2023  05/24/2019 04/03/2019  Depression screen PHQ 2/9  Decreased Interest 2 0 0  Down, Depressed, Hopeless 2 0 0  PHQ - 2 Score 4 0 0  Altered sleeping 2 - -  Tired, decreased energy 3 - -  Change in appetite 1 - -  Feeling bad or failure about yourself  0 - -  Trouble concentrating 0 - -  Moving slowly or fidgety/restless 0 - -  Suicidal thoughts 0 - -  PHQ-9 Score 10 - -  Difficult doing work/chores Somewhat difficult - -   Interpretation of Total Score  Total Score Depression Severity:  1-4 = Minimal depression, 5-9 = Mild depression, 10-14 = Moderate depression, 15-19 = Moderately severe depression, 20-27 = Severe depression   Psychosocial Evaluation and Intervention:  Psychosocial Evaluation - 07/26/23 1127       Psychosocial Evaluation & Interventions   Interventions Encouraged to exercise with the program and follow exercise prescription;Stress management education    Comments Oreta admits to feeling depressed some days. She is also under alot of stress due to her lung condition and having to need oxygen. 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 She does have a strong support system of family and friends. She is compliant with taking her psychotropic meds and denies needing a referral to a mental health specialist at this time.    Expected Outcomes For Shylo to participate in PR with less depression and stress    Continue Psychosocial Services  Follow up required by staff             Psychosocial Re-Evaluation:  Psychosocial Re-Evaluation     Row Name 08/04/23 1011 09/02/23 1633           Psychosocial Re-Evaluation   Current issues with Current Depression;History of Depression;Current Psychotropic Meds;Current Sleep Concerns Current Depression;Current Psychotropic Meds;Current Stress Concerns      Comments No changes since orientation. Makya has not started the program yet Loneta is still struggling with the fact that she needs to  have oxygen 24/7. She feels overwhelmed at this time and states she can't be away from her  home long because the battery on her POC doesn't last. She has also had a recent hospital admission for heart failure. We will continue to support Daizee with whatever she needs.      Expected Outcomes For Jenisse to participate in PR free of any psy/soc barriers or concerns. To have decreased stress and reduced s/s of depression For Emelee to participate in PR free of any psy/soc barriers or concerns. To have decreased stress and reduced s/s of depression      Interventions Encouraged to attend Pulmonary Rehabilitation for the exercise Encouraged to attend Pulmonary Rehabilitation for the exercise      Continue Psychosocial Services  No Follow up required Follow up required by staff               Psychosocial Discharge (Final Psychosocial Re-Evaluation):  Psychosocial Re-Evaluation - 09/02/23 1633       Psychosocial Re-Evaluation   Current issues with Current Depression;Current Psychotropic Meds;Current Stress Concerns    Comments Kurt is still struggling with the fact that she needs to have oxygen 24/7. She feels overwhelmed at this time and states she can't be away from her home long because the battery on her POC doesn't last. She has also had a recent hospital admission for heart failure. We will continue to support Zsazsa with whatever she needs.    Expected Outcomes For Evangelyne to participate in PR free of any psy/soc barriers or concerns. To have decreased stress and reduced s/s of depression    Interventions Encouraged to attend Pulmonary Rehabilitation for the exercise    Continue Psychosocial Services  Follow up required by staff             Education: Education Goals: Education classes will be provided on a weekly basis, covering required topics. Participant will state understanding/return demonstration of topics presented.  Learning Barriers/Preferences:  Learning  Barriers/Preferences - 07/26/23 1127       Learning Barriers/Preferences   Learning Barriers None    Learning Preferences Skilled Demonstration             Education Topics: Know Your Numbers Group instruction that is supported by a PowerPoint presentation. Instructor discusses importance of knowing and understanding resting, exercise, and post-exercise oxygen saturation, heart rate, and blood pressure. Oxygen saturation, heart rate, blood pressure, rating of perceived exertion, and dyspnea are reviewed along with a normal range for these values.    Exercise for the Pulmonary Patient Group instruction that is supported by a PowerPoint presentation. Instructor discusses benefits of exercise, core components of exercise, frequency, duration, and intensity of an exercise routine, importance of utilizing pulse oximetry during exercise, safety while exercising, and options of places to exercise outside of rehab.    MET Level  Group instruction provided by PowerPoint, verbal discussion, and written material to support subject matter. Instructor reviews what METs are and how to increase METs.    Pulmonary Medications Verbally interactive group education provided by instructor with focus on inhaled medications and proper administration.   Anatomy and Physiology of the Respiratory System Group instruction provided by PowerPoint, verbal discussion, and written material to support subject matter. Instructor reviews respiratory cycle and anatomical components of the respiratory system and their functions. Instructor also reviews differences in obstructive and restrictive respiratory diseases with examples of each.  Flowsheet Row PULMONARY REHAB CHRONIC OBSTRUCTIVE PULMONARY DISEASE from 08/26/2023 in [facility]  Date 08/26/23  Educator RT  Instruction Review Code 1- Verbalizes  Understanding       Oxygen Safety Group instruction  provided by PowerPoint, verbal discussion, and written material to support subject matter. There is an overview of "What is Oxygen" and "Why do we need it".  Instructor also reviews how to create a safe environment for oxygen use, the importance of using oxygen as prescribed, and the risks of noncompliance. There is a brief discussion on traveling with oxygen and resources the patient may utilize.   Oxygen Use Group instruction provided by PowerPoint, verbal discussion, and written material to discuss how supplemental oxygen is prescribed and different types of oxygen supply systems. Resources for more information are provided.    Breathing Techniques Group instruction that is supported by demonstration and informational handouts. Instructor discusses the benefits of pursed lip and diaphragmatic breathing and detailed demonstration on how to perform both.     Risk Factor Reduction Group instruction that is supported by a PowerPoint presentation. Instructor discusses the definition of a risk factor, different risk factors for pulmonary disease, and how the heart and lungs work together. Flowsheet Row PULMONARY REHAB CHRONIC OBSTRUCTIVE PULMONARY DISEASE from 08/05/2023 in [facility]  Date 08/05/23  Educator EP  Instruction Review Code 1- Verbalizes Understanding       Pulmonary Diseases Group instruction provided by PowerPoint, verbal discussion, and written material to support subject matter. Instructor gives an overview of the different type of pulmonary diseases. There is also a discussion on risk factors and symptoms as well as ways to manage the diseases.   Stress and Energy Conservation Group instruction provided by PowerPoint, verbal discussion, and written material to support subject matter. Instructor gives an overview of stress and the impact it can have on the body. Instructor also reviews ways to reduce stress. There is  also a discussion on energy conservation and ways to conserve energy throughout the day.   Warning Signs and Symptoms Group instruction provided by PowerPoint, verbal discussion, and written material to support subject matter. Instructor reviews warning signs and symptoms of stroke, heart attack, cold and flu. Instructor also reviews ways to prevent the spread of infection.   Other Education Group or individual verbal, written, or video instructions that support the educational goals of the pulmonary rehab program.    Knowledge Questionnaire Score:  Knowledge Questionnaire Score - 07/26/23 1204       Knowledge Questionnaire Score   Pre Score 14/18             Core Components/Risk Factors/Patient Goals at Admission:  Personal Goals and Risk Factors at Admission - 07/26/23 1127       Core Components/Risk Factors/Patient Goals on Admission   Improve shortness of breath with ADL's Yes    Intervention Provide education, individualized exercise plan and daily activity instruction to help decrease symptoms of SOB with activities of daily living.    Expected Outcomes Short Term: Improve cardiorespiratory fitness to achieve a reduction of symptoms when performing ADLs;Long Term: Be able to perform more ADLs without symptoms or delay the onset of symptoms    Increase knowledge of respiratory medications and ability to use respiratory devices properly  Yes    Intervention Provide education and demonstration as needed of appropriate use of medications, inhalers, and oxygen therapy.    Expected Outcomes Short Term: Achieves understanding of medications use. Understands that oxygen is a medication prescribed by physician. Demonstrates appropriate use of inhaler and oxygen therapy.;Long Term: Maintain appropriate use of medications, inhalers,  and oxygen therapy.    Stress Yes    Intervention Offer individual and/or small group education and counseling on adjustment to heart disease, stress  management and health-related lifestyle change. Teach and support self-help strategies.;Refer participants experiencing significant psychosocial distress to appropriate mental health specialists for further evaluation and treatment. When possible, include family members and significant others in education/counseling sessions.    Expected Outcomes Short Term: Participant demonstrates changes in health-related behavior, relaxation and other stress management skills, ability to obtain effective social support, and compliance with psychotropic medications if prescribed.;Long Term: Emotional wellbeing is indicated by absence of clinically significant psychosocial distress or social isolation.             Core Components/Risk Factors/Patient Goals Review:   Goals and Risk Factor Review     Row Name 08/04/23 1013 09/02/23 1636           Core Components/Risk Factors/Patient Goals Review   Personal Goals Review Improve shortness of breath with ADL's;Develop more efficient breathing techniques such as purse lipped breathing and diaphragmatic breathing and practicing self-pacing with activity.;Increase knowledge of respiratory medications and ability to use respiratory devices properly. Weight Management/Obesity;Improve shortness of breath with ADL's;Develop more efficient breathing techniques such as purse lipped breathing and diaphragmatic breathing and practicing self-pacing with activity.;Increase knowledge of respiratory medications and ability to use respiratory devices properly.;Stress      Review Unable to assess, Taraji has not started the program yet Almarosa has only attended 4 sessions so far. Unfortunately Rossi was recently hospitalized for heart failure. Goal progressing for improving shortness of breath with ADL's. She is currently able to maintain sats >88% on 3-4L while exercising. Goal progressing for developing more efficient breathing techniques such as purse lipped breathing and  diaphragmatic breathing; and practicing self-pacing with activity. Goal progressing for increase knowledge of respiratory medications and ability to use respiratory devices properly. Goal progressing for maintaining weight. Goal progressing for reducing stress.      Expected Outcomes For Tiernan to develop more efficient breathing techniques such as purse lipped breathing and diaphragmatic breathing; and practice self-pacing with activity and improve her shortness of breath with ADL's, to increase her knowledge of respiratory medications and correctly use respiratory devices, to improve her shortness of breath with ADLs For Merna to develop more efficient breathing techniques such as purse lipped breathing and diaphragmatic breathing; and practice self-pacing with activity, improve her shortness of breath with ADL's, to increase her knowledge of respiratory medications and correctly use respiratory devices, to increase knowledge of respiratory medications and ability to use respiratory devices properly and decrease stress.               Core Components/Risk Factors/Patient Goals at Discharge (Final Review):   Goals and Risk Factor Review - 09/02/23 1636       Core Components/Risk Factors/Patient Goals Review   Personal Goals Review Weight Management/Obesity;Improve shortness of breath with ADL's;Develop more efficient breathing techniques such as purse lipped breathing and diaphragmatic breathing and practicing self-pacing with activity.;Increase knowledge of respiratory medications and ability to use respiratory devices properly.;Stress    Review Alaira has only attended 4 sessions so far. Unfortunately Kainat was recently hospitalized for heart failure. Goal progressing for improving shortness of breath with ADL's. She is currently able to maintain sats >88% on 3-4L while exercising. Goal progressing for developing more efficient breathing techniques such as purse lipped breathing and diaphragmatic  breathing; and practicing self-pacing with activity. Goal progressing for increase knowledge of respiratory medications and  ability to use respiratory devices properly. Goal progressing for maintaining weight. Goal progressing for reducing stress.    Expected Outcomes For Fiora to develop more efficient breathing techniques such as purse lipped breathing and diaphragmatic breathing; and practice self-pacing with activity, improve her shortness of breath with ADL's, to increase her knowledge of respiratory medications and correctly use respiratory devices, to increase knowledge of respiratory medications and ability to use respiratory devices properly and decrease stress.             ITP Comments: Pt is making expected progress toward Pulmonary Rehab goals after completing 4 session(s). Recommend continued exercise, life style modification, education, and utilization of breathing techniques to increase stamina and strength, while also decreasing shortness of breath with exertion.  Dr. Mechele Collin is Medical Director for Pulmonary Rehab at [facility].

## 2023-09-09 ENCOUNTER — Encounter (HOSPITAL_COMMUNITY): Payer: Medicare Other

## 2023-09-09 ENCOUNTER — Ambulatory Visit: Attending: Student | Admitting: Student

## 2023-09-09 ENCOUNTER — Encounter: Payer: Self-pay | Admitting: Student

## 2023-09-09 VITALS — BP 93/54 | Ht 64.0 in | Wt 118.4 lb

## 2023-09-09 DIAGNOSIS — I351 Nonrheumatic aortic (valve) insufficiency: Secondary | ICD-10-CM

## 2023-09-09 DIAGNOSIS — J449 Chronic obstructive pulmonary disease, unspecified: Secondary | ICD-10-CM

## 2023-09-09 DIAGNOSIS — I480 Paroxysmal atrial fibrillation: Secondary | ICD-10-CM | POA: Diagnosis not present

## 2023-09-09 DIAGNOSIS — I959 Hypotension, unspecified: Secondary | ICD-10-CM

## 2023-09-09 DIAGNOSIS — I428 Other cardiomyopathies: Secondary | ICD-10-CM

## 2023-09-09 DIAGNOSIS — N1831 Chronic kidney disease, stage 3a: Secondary | ICD-10-CM

## 2023-09-09 DIAGNOSIS — R079 Chest pain, unspecified: Secondary | ICD-10-CM | POA: Diagnosis not present

## 2023-09-09 DIAGNOSIS — I340 Nonrheumatic mitral (valve) insufficiency: Secondary | ICD-10-CM

## 2023-09-09 DIAGNOSIS — E785 Hyperlipidemia, unspecified: Secondary | ICD-10-CM

## 2023-09-09 DIAGNOSIS — I5022 Chronic systolic (congestive) heart failure: Secondary | ICD-10-CM

## 2023-09-09 DIAGNOSIS — I2510 Atherosclerotic heart disease of native coronary artery without angina pectoris: Secondary | ICD-10-CM

## 2023-09-09 DIAGNOSIS — J9611 Chronic respiratory failure with hypoxia: Secondary | ICD-10-CM

## 2023-09-09 DIAGNOSIS — I77810 Thoracic aortic ectasia: Secondary | ICD-10-CM

## 2023-09-09 DIAGNOSIS — Z87898 Personal history of other specified conditions: Secondary | ICD-10-CM

## 2023-09-09 NOTE — Patient Instructions (Signed)
 Medication Instructions:  HOLD BISOPROLOL AND LOSARTAN UNTIL WE CALL TO LET YOU KNOW WHEN TO RESTART *If you need a refill on your cardiac medications before your next appointment, please call your pharmacy*  Lab Work: CBC AND BMET If you have labs (blood work) drawn today and your tests are completely normal, you will receive your results only by:  MyChart Message (if you have MyChart) OR  A paper copy in the mail If you have any lab test that is abnormal or we need to change your treatment, we will call you to review the results.  Follow-Up: At [facility], you and your health needs are our priority.  As part of our continuing mission to provide you with exceptional heart care, we have created designated Provider Care Teams.  These Care Teams include your primary Cardiologist (physician) and Advanced Practice Providers (APPs -  Physician Assistants and Nurse Practitioners) who all work together to provide you with the care you need, when you need it.  We recommend signing up for the patient portal called "MyChart".  Sign up information is provided on this After Visit Summary.  MyChart is used to connect with patients for Virtual Visits (Telemedicine).  Patients are able to view lab/test results, encounter notes, upcoming appointments, etc.  Non-urgent messages can be sent to your provider as well.   To learn more about what you can do with MyChart, go to [email or web address].    Your next appointment:   09-22-2023   Provider:   Nanetta Batty, MD     Other Instructions PUSH FLUIDS (NO MORE THAN 64 OUNCES) EAT DINNER, HIGH PROTEIN FOODS ANY WORSENING OR WEAKNESS GO TO THE ER!  MAKE SURE TO FOLLOW UP WITH YOUR PRIMARY-DISCUSS UTI

## 2023-09-10 ENCOUNTER — Telehealth: Payer: Self-pay | Admitting: Student

## 2023-09-10 ENCOUNTER — Telehealth (HOSPITAL_COMMUNITY): Payer: Self-pay | Admitting: *Deleted

## 2023-09-10 DIAGNOSIS — J441 Chronic obstructive pulmonary disease with (acute) exacerbation: Secondary | ICD-10-CM | POA: Diagnosis not present

## 2023-09-10 DIAGNOSIS — I428 Other cardiomyopathies: Secondary | ICD-10-CM | POA: Diagnosis not present

## 2023-09-10 DIAGNOSIS — I110 Hypertensive heart disease with heart failure: Secondary | ICD-10-CM | POA: Diagnosis not present

## 2023-09-10 DIAGNOSIS — I2510 Atherosclerotic heart disease of native coronary artery without angina pectoris: Secondary | ICD-10-CM | POA: Diagnosis not present

## 2023-09-10 DIAGNOSIS — Z7901 Long term (current) use of anticoagulants: Secondary | ICD-10-CM | POA: Diagnosis not present

## 2023-09-10 DIAGNOSIS — E785 Hyperlipidemia, unspecified: Secondary | ICD-10-CM | POA: Diagnosis not present

## 2023-09-10 DIAGNOSIS — J309 Allergic rhinitis, unspecified: Secondary | ICD-10-CM | POA: Diagnosis not present

## 2023-09-10 DIAGNOSIS — R300 Dysuria: Secondary | ICD-10-CM | POA: Diagnosis not present

## 2023-09-10 DIAGNOSIS — I712 Thoracic aortic aneurysm, without rupture, unspecified: Secondary | ICD-10-CM | POA: Diagnosis not present

## 2023-09-10 DIAGNOSIS — J9621 Acute and chronic respiratory failure with hypoxia: Secondary | ICD-10-CM | POA: Diagnosis not present

## 2023-09-10 DIAGNOSIS — I081 Rheumatic disorders of both mitral and tricuspid valves: Secondary | ICD-10-CM | POA: Diagnosis not present

## 2023-09-10 DIAGNOSIS — I5042 Chronic combined systolic (congestive) and diastolic (congestive) heart failure: Secondary | ICD-10-CM | POA: Diagnosis not present

## 2023-09-10 DIAGNOSIS — H34212 Partial retinal artery occlusion, left eye: Secondary | ICD-10-CM | POA: Diagnosis not present

## 2023-09-10 DIAGNOSIS — Z7951 Long term (current) use of inhaled steroids: Secondary | ICD-10-CM | POA: Diagnosis not present

## 2023-09-10 DIAGNOSIS — M8580 Other specified disorders of bone density and structure, unspecified site: Secondary | ICD-10-CM | POA: Diagnosis not present

## 2023-09-10 DIAGNOSIS — I480 Paroxysmal atrial fibrillation: Secondary | ICD-10-CM | POA: Diagnosis not present

## 2023-09-10 DIAGNOSIS — I447 Left bundle-branch block, unspecified: Secondary | ICD-10-CM | POA: Diagnosis not present

## 2023-09-10 LAB — CBC
Hematocrit: 31.8 % — ABNORMAL LOW (ref 34.0–46.6)
Hemoglobin: 10.7 g/dL — ABNORMAL LOW (ref 11.1–15.9)
MCH: 32.5 pg (ref 26.6–33.0)
MCHC: 33.6 g/dL (ref 31.5–35.7)
MCV: 97 fL (ref 79–97)
Platelets: 144 10*3/uL — ABNORMAL LOW (ref 150–450)
RBC: 3.29 x10E6/uL — ABNORMAL LOW (ref 3.77–5.28)
RDW: 14.5 % (ref 11.7–15.4)
WBC: 4.4 10*3/uL (ref 3.4–10.8)

## 2023-09-10 LAB — COMPREHENSIVE METABOLIC PANEL
ALT: 30 IU/L (ref 0–32)
AST: 30 IU/L (ref 0–40)
Albumin: 4.2 g/dL (ref 3.7–4.7)
Alkaline Phosphatase: 43 IU/L — ABNORMAL LOW (ref 44–121)
BUN/Creatinine Ratio: 18 (ref 12–28)
BUN: 22 mg/dL (ref 8–27)
Bilirubin Total: 0.2 mg/dL (ref 0.0–1.2)
CO2: 22 mmol/L (ref 20–29)
Calcium: 9 mg/dL (ref 8.7–10.3)
Chloride: 99 mmol/L (ref 96–106)
Creatinine, Ser: 1.23 mg/dL — ABNORMAL HIGH (ref 0.57–1.00)
Globulin, Total: 1.6 g/dL (ref 1.5–4.5)
Glucose: 116 mg/dL — ABNORMAL HIGH (ref 70–99)
Potassium: 4 mmol/L (ref 3.5–5.2)
Sodium: 136 mmol/L (ref 134–144)
Total Protein: 5.8 g/dL — ABNORMAL LOW (ref 6.0–8.5)
eGFR: 44 mL/min/{1.73_m2} — ABNORMAL LOW (ref >59)

## 2023-09-10 NOTE — Telephone Encounter (Signed)
 Left message for pt to call.

## 2023-09-10 NOTE — Telephone Encounter (Addendum)
 I tried to call patient earlier this morning but had to leave a message. Can you please try to call again later today to check to see how she is doing compared to yesterday and to go over labs?  Brief background for you, Dana Pruitt or Triage (not sure if Dana Pruitt is working today or not). I saw patient in office yesterday. Her main complaint was significant weakness. She described decreased PO intake for the last several days and BP was low in the office so I thought she may be dehydrated. She has COPD and is on 4L of O2 at baseline. She reported that she was a little more short of breath than usual but had no signs of volume overload on exam to suggest that this was from CHF. After her  visit, I noticed that her O2 was documented at 77%. Although she did not have any increased work of breathing when I was in the room and no signs of cyanosis so I am not sure if this was accurate. I called her last night as soon as I noticed this and she was going to recheck her O2 sat. I gave her really clear instructions to go to the ED if O2 sat was truly that low.   Overall, labs look okay. Hemoglobin and platelets are a little low but stable from where they were in the hospital. Her creatinine (which helps assess kidney function) is a little higher than it was on last check in the hospital but still around her baseline. I would recommend she hold her Losartan and Bisoprolol today and tomorrow. If her BP is better on Sunday (systolic BP >105), she can take both of these. I would still recommend she follow-up with PCP for possible UTI and COPD exacerbation. I don't think her dyspnea is due to a CHF exacerbation.   Can you also please check on her O2 sat?   I still have a very low threshold for patient to go to the ED if her significant weakness does not improve or if she is still more short of breath than usual and her O2 sat has not improved from yesterday when I called after her visit (please see office visit note for  more information).  FYI for [facility] and Triage: I am off today and Monday. I probably will not be able to be by my computer much the rest of the day but feel free to call me if there are any issues. I told the patient that I would call her on Monday again to check in.   Thank you all so much! Dana Pruitt

## 2023-09-10 NOTE — Telephone Encounter (Signed)
 Reviewed most recent cardiology notes and called Dana Pruitt to discuss her participation in PR. LVM for her to return our call.  Ethelda Chick BS, ACSM-CEP 09/10/2023 2:53 PM

## 2023-09-13 ENCOUNTER — Encounter (HOSPITAL_COMMUNITY): Payer: Self-pay

## 2023-09-13 ENCOUNTER — Other Ambulatory Visit: Payer: Self-pay

## 2023-09-13 ENCOUNTER — Telehealth: Payer: Self-pay | Admitting: Nurse Practitioner

## 2023-09-13 ENCOUNTER — Inpatient Hospital Stay (HOSPITAL_COMMUNITY)
Admission: EM | Admit: 2023-09-13 | Discharge: 2023-09-16 | DRG: 871 | Disposition: A | Discharge status: Home Health | Attending: Internal Medicine | Admitting: Internal Medicine

## 2023-09-13 ENCOUNTER — Emergency Department (HOSPITAL_COMMUNITY)

## 2023-09-13 DIAGNOSIS — J1008 Influenza due to other identified influenza virus with other specified pneumonia: Secondary | ICD-10-CM | POA: Diagnosis not present

## 2023-09-13 DIAGNOSIS — R0689 Other abnormalities of breathing: Secondary | ICD-10-CM | POA: Diagnosis not present

## 2023-09-13 DIAGNOSIS — F419 Anxiety disorder, unspecified: Secondary | ICD-10-CM | POA: Diagnosis present

## 2023-09-13 DIAGNOSIS — D509 Iron deficiency anemia, unspecified: Secondary | ICD-10-CM | POA: Diagnosis not present

## 2023-09-13 DIAGNOSIS — I428 Other cardiomyopathies: Secondary | ICD-10-CM | POA: Diagnosis present

## 2023-09-13 DIAGNOSIS — A419 Sepsis, unspecified organism: Secondary | ICD-10-CM | POA: Diagnosis not present

## 2023-09-13 DIAGNOSIS — R0902 Hypoxemia: Secondary | ICD-10-CM | POA: Diagnosis not present

## 2023-09-13 DIAGNOSIS — I77810 Thoracic aortic ectasia: Secondary | ICD-10-CM | POA: Diagnosis not present

## 2023-09-13 DIAGNOSIS — J9621 Acute and chronic respiratory failure with hypoxia: Secondary | ICD-10-CM | POA: Diagnosis present

## 2023-09-13 DIAGNOSIS — J449 Chronic obstructive pulmonary disease, unspecified: Secondary | ICD-10-CM

## 2023-09-13 DIAGNOSIS — I2721 Secondary pulmonary arterial hypertension: Secondary | ICD-10-CM | POA: Diagnosis present

## 2023-09-13 DIAGNOSIS — J441 Chronic obstructive pulmonary disease with (acute) exacerbation: Secondary | ICD-10-CM | POA: Diagnosis present

## 2023-09-13 DIAGNOSIS — J159 Unspecified bacterial pneumonia: Secondary | ICD-10-CM | POA: Diagnosis not present

## 2023-09-13 DIAGNOSIS — Z8249 Family history of ischemic heart disease and other diseases of the circulatory system: Secondary | ICD-10-CM

## 2023-09-13 DIAGNOSIS — E785 Hyperlipidemia, unspecified: Secondary | ICD-10-CM | POA: Diagnosis not present

## 2023-09-13 DIAGNOSIS — Z7901 Long term (current) use of anticoagulants: Secondary | ICD-10-CM

## 2023-09-13 DIAGNOSIS — I7121 Aneurysm of the ascending aorta, without rupture: Secondary | ICD-10-CM | POA: Diagnosis not present

## 2023-09-13 DIAGNOSIS — I2489 Other forms of acute ischemic heart disease: Secondary | ICD-10-CM | POA: Diagnosis present

## 2023-09-13 DIAGNOSIS — Z7951 Long term (current) use of inhaled steroids: Secondary | ICD-10-CM

## 2023-09-13 DIAGNOSIS — Z1152 Encounter for screening for COVID-19: Secondary | ICD-10-CM | POA: Diagnosis not present

## 2023-09-13 DIAGNOSIS — J440 Chronic obstructive pulmonary disease with acute lower respiratory infection: Secondary | ICD-10-CM | POA: Diagnosis not present

## 2023-09-13 DIAGNOSIS — F32A Depression, unspecified: Secondary | ICD-10-CM | POA: Diagnosis present

## 2023-09-13 DIAGNOSIS — I2510 Atherosclerotic heart disease of native coronary artery without angina pectoris: Secondary | ICD-10-CM | POA: Diagnosis present

## 2023-09-13 DIAGNOSIS — Z79899 Other long term (current) drug therapy: Secondary | ICD-10-CM

## 2023-09-13 DIAGNOSIS — Z952 Presence of prosthetic heart valve: Secondary | ICD-10-CM

## 2023-09-13 DIAGNOSIS — I5042 Chronic combined systolic (congestive) and diastolic (congestive) heart failure: Secondary | ICD-10-CM | POA: Diagnosis not present

## 2023-09-13 DIAGNOSIS — Z9221 Personal history of antineoplastic chemotherapy: Secondary | ICD-10-CM

## 2023-09-13 DIAGNOSIS — J479 Bronchiectasis, uncomplicated: Secondary | ICD-10-CM | POA: Diagnosis not present

## 2023-09-13 DIAGNOSIS — Z853 Personal history of malignant neoplasm of breast: Secondary | ICD-10-CM

## 2023-09-13 DIAGNOSIS — R531 Weakness: Secondary | ICD-10-CM

## 2023-09-13 DIAGNOSIS — J9601 Acute respiratory failure with hypoxia: Principal | ICD-10-CM

## 2023-09-13 DIAGNOSIS — E861 Hypovolemia: Secondary | ICD-10-CM | POA: Diagnosis not present

## 2023-09-13 DIAGNOSIS — I110 Hypertensive heart disease with heart failure: Secondary | ICD-10-CM | POA: Diagnosis not present

## 2023-09-13 DIAGNOSIS — A4189 Other specified sepsis: Secondary | ICD-10-CM | POA: Diagnosis not present

## 2023-09-13 DIAGNOSIS — Z9981 Dependence on supplemental oxygen: Secondary | ICD-10-CM

## 2023-09-13 DIAGNOSIS — R000 Tachycardia, unspecified: Secondary | ICD-10-CM | POA: Diagnosis not present

## 2023-09-13 DIAGNOSIS — E871 Hypo-osmolality and hyponatremia: Secondary | ICD-10-CM | POA: Diagnosis present

## 2023-09-13 DIAGNOSIS — I480 Paroxysmal atrial fibrillation: Secondary | ICD-10-CM | POA: Diagnosis present

## 2023-09-13 DIAGNOSIS — J439 Emphysema, unspecified: Secondary | ICD-10-CM | POA: Diagnosis not present

## 2023-09-13 DIAGNOSIS — I700 Atherosclerosis of aorta: Secondary | ICD-10-CM | POA: Diagnosis not present

## 2023-09-13 DIAGNOSIS — K219 Gastro-esophageal reflux disease without esophagitis: Secondary | ICD-10-CM | POA: Diagnosis present

## 2023-09-13 DIAGNOSIS — Z8673 Personal history of transient ischemic attack (TIA), and cerebral infarction without residual deficits: Secondary | ICD-10-CM

## 2023-09-13 DIAGNOSIS — R0602 Shortness of breath: Secondary | ICD-10-CM | POA: Diagnosis not present

## 2023-09-13 DIAGNOSIS — R918 Other nonspecific abnormal finding of lung field: Secondary | ICD-10-CM | POA: Diagnosis not present

## 2023-09-13 DIAGNOSIS — Z87891 Personal history of nicotine dependence: Secondary | ICD-10-CM

## 2023-09-13 DIAGNOSIS — J101 Influenza due to other identified influenza virus with other respiratory manifestations: Secondary | ICD-10-CM

## 2023-09-13 DIAGNOSIS — Z923 Personal history of irradiation: Secondary | ICD-10-CM

## 2023-09-13 DIAGNOSIS — R6520 Severe sepsis without septic shock: Secondary | ICD-10-CM | POA: Diagnosis present

## 2023-09-13 DIAGNOSIS — I509 Heart failure, unspecified: Secondary | ICD-10-CM

## 2023-09-13 DIAGNOSIS — Z87440 Personal history of urinary (tract) infections: Secondary | ICD-10-CM

## 2023-09-13 LAB — COMPREHENSIVE METABOLIC PANEL
ALT: 28 U/L (ref 0–44)
AST: 25 U/L (ref 15–41)
Albumin: 3.2 g/dL — ABNORMAL LOW (ref 3.5–5.0)
Alkaline Phosphatase: 43 U/L (ref 38–126)
Anion gap: 11 (ref 5–15)
BUN: 14 mg/dL (ref 8–23)
CO2: 23 mmol/L (ref 22–32)
Calcium: 9.2 mg/dL (ref 8.9–10.3)
Chloride: 96 mmol/L — ABNORMAL LOW (ref 98–111)
Creatinine, Ser: 0.93 mg/dL (ref 0.44–1.00)
GFR, Estimated: >60 mL/min (ref >60)
Glucose, Bld: 137 mg/dL — ABNORMAL HIGH (ref 70–99)
Potassium: 3.4 mmol/L — ABNORMAL LOW (ref 3.5–5.1)
Sodium: 130 mmol/L — ABNORMAL LOW (ref 135–145)
Total Bilirubin: 1.1 mg/dL (ref 0.0–1.2)
Total Protein: 6.5 g/dL (ref 6.5–8.1)

## 2023-09-13 LAB — I-STAT CHEM 8, ED
BUN: 13 mg/dL (ref 8–23)
Calcium, Ion: 1.16 mmol/L (ref 1.15–1.40)
Chloride: 98 mmol/L (ref 98–111)
Creatinine, Ser: 0.8 mg/dL (ref 0.44–1.00)
Glucose, Bld: 137 mg/dL — ABNORMAL HIGH (ref 70–99)
HCT: 30 % — ABNORMAL LOW (ref 36.0–46.0)
Hemoglobin: 10.2 g/dL — ABNORMAL LOW (ref 12.0–15.0)
Potassium: 3.5 mmol/L (ref 3.5–5.1)
Sodium: 131 mmol/L — ABNORMAL LOW (ref 135–145)
TCO2: 21 mmol/L — ABNORMAL LOW (ref 22–32)

## 2023-09-13 LAB — URINALYSIS, W/ REFLEX TO CULTURE (INFECTION SUSPECTED)
Bilirubin Urine: NEGATIVE
Glucose, UA: NEGATIVE mg/dL
Ketones, ur: 20 mg/dL — AB
Leukocytes,Ua: NEGATIVE
Nitrite: NEGATIVE
Protein, ur: NEGATIVE mg/dL
Specific Gravity, Urine: 1.012 (ref 1.005–1.030)
pH: 5 (ref 5.0–8.0)

## 2023-09-13 LAB — LIPASE, BLOOD: Lipase: 26 U/L (ref 11–51)

## 2023-09-13 LAB — I-STAT VENOUS BLOOD GAS, ED
Acid-base deficit: 1 mmol/L (ref 0.0–2.0)
Bicarbonate: 23.7 mmol/L (ref 20.0–28.0)
Calcium, Ion: 1.19 mmol/L (ref 1.15–1.40)
HCT: 29 % — ABNORMAL LOW (ref 36.0–46.0)
Hemoglobin: 9.9 g/dL — ABNORMAL LOW (ref 12.0–15.0)
O2 Saturation: 77 %
Potassium: 3.5 mmol/L (ref 3.5–5.1)
Sodium: 131 mmol/L — ABNORMAL LOW (ref 135–145)
TCO2: 25 mmol/L (ref 22–32)
pCO2, Ven: 36.5 mmHg — ABNORMAL LOW (ref 44–60)
pH, Ven: 7.421 (ref 7.25–7.43)
pO2, Ven: 40 mmHg (ref 32–45)

## 2023-09-13 LAB — CBC WITH DIFFERENTIAL/PLATELET
Abs Immature Granulocytes: 0.14 10*3/uL — ABNORMAL HIGH (ref 0.00–0.07)
Basophils Absolute: 0 10*3/uL (ref 0.0–0.1)
Basophils Relative: 0 %
Eosinophils Absolute: 0 10*3/uL (ref 0.0–0.5)
Eosinophils Relative: 0 %
HCT: 29.3 % — ABNORMAL LOW (ref 36.0–46.0)
Hemoglobin: 9.9 g/dL — ABNORMAL LOW (ref 12.0–15.0)
Immature Granulocytes: 2 %
Lymphocytes Relative: 3 %
Lymphs Abs: 0.3 10*3/uL — ABNORMAL LOW (ref 0.7–4.0)
MCH: 31.9 pg (ref 26.0–34.0)
MCHC: 33.8 g/dL (ref 30.0–36.0)
MCV: 94.5 fL (ref 80.0–100.0)
Monocytes Absolute: 0.3 10*3/uL (ref 0.1–1.0)
Monocytes Relative: 3 %
Neutro Abs: 8.4 10*3/uL — ABNORMAL HIGH (ref 1.7–7.7)
Neutrophils Relative %: 92 %
Platelets: 277 10*3/uL (ref 150–400)
RBC: 3.1 MIL/uL — ABNORMAL LOW (ref 3.87–5.11)
RDW: 14.8 % (ref 11.5–15.5)
WBC: 9.1 10*3/uL (ref 4.0–10.5)
nRBC: 0 % (ref 0.0–0.2)

## 2023-09-13 LAB — PROTIME-INR
INR: 1.4 — ABNORMAL HIGH (ref 0.8–1.2)
Prothrombin Time: 17.1 s — ABNORMAL HIGH (ref 11.4–15.2)

## 2023-09-13 LAB — APTT: aPTT: 32 s (ref 24–36)

## 2023-09-13 LAB — RESP PANEL BY RT-PCR (RSV, FLU A&B, COVID)  RVPGX2
Influenza A by PCR: POSITIVE — AB
Influenza B by PCR: NEGATIVE
Resp Syncytial Virus by PCR: NEGATIVE
SARS Coronavirus 2 by RT PCR: NEGATIVE

## 2023-09-13 LAB — TROPONIN I (HIGH SENSITIVITY)
Troponin I (High Sensitivity): 55 ng/L — ABNORMAL HIGH (ref <18)
Troponin I (High Sensitivity): 50 ng/L — ABNORMAL HIGH (ref <18)

## 2023-09-13 LAB — I-STAT CG4 LACTIC ACID, ED: Lactic Acid, Venous: 1.2 mmol/L (ref 0.5–1.9)

## 2023-09-13 LAB — BRAIN NATRIURETIC PEPTIDE: B Natriuretic Peptide: 905 pg/mL — ABNORMAL HIGH (ref 0.0–100.0)

## 2023-09-13 MED ORDER — DOXYCYCLINE HYCLATE 100 MG PO TABS: 100.0000 mg | ORAL_TABLET | Freq: Two times a day (BID) | ORAL | 0 refills | Status: DC

## 2023-09-13 MED ORDER — AMIODARONE HCL 200 MG PO TABS
200.0000 mg | ORAL_TABLET | Freq: Every day | ORAL | Status: DC
  Administered 2023-09-13 – 2023-09-16 (×4): 200 mg via ORAL
  Filled 2023-09-13 (×4): qty 1

## 2023-09-13 MED ORDER — METHYLPREDNISOLONE SODIUM SUCC 125 MG IJ SOLR
125.0000 mg | Freq: Once | INTRAMUSCULAR | Status: AC
  Administered 2023-09-13: 125 mg via INTRAVENOUS
  Filled 2023-09-13: qty 2

## 2023-09-13 MED ORDER — SODIUM CHLORIDE 0.9 % IV SOLN
2.0000 g | Freq: Once | INTRAVENOUS | Status: AC
  Administered 2023-09-13: 2 g via INTRAVENOUS
  Filled 2023-09-13: qty 12.5

## 2023-09-13 MED ORDER — PREDNISONE 10 MG PO TABS: ORAL_TABLET | ORAL | 0 refills | Status: DC

## 2023-09-13 MED ORDER — IPRATROPIUM-ALBUTEROL 0.5-2.5 (3) MG/3ML IN SOLN
3.0000 mL | Freq: Once | RESPIRATORY_TRACT | Status: AC
  Administered 2023-09-13: 3 mL via RESPIRATORY_TRACT
  Filled 2023-09-13: qty 3

## 2023-09-13 MED ORDER — METRONIDAZOLE 500 MG/100ML IV SOLN
500.0000 mg | Freq: Once | INTRAVENOUS | Status: AC
  Administered 2023-09-13: 500 mg via INTRAVENOUS
  Filled 2023-09-13: qty 100

## 2023-09-13 MED ORDER — IPRATROPIUM-ALBUTEROL 0.5-2.5 (3) MG/3ML IN SOLN
3.0000 mL | Freq: Four times a day (QID) | RESPIRATORY_TRACT | Status: DC
  Administered 2023-09-13 – 2023-09-14 (×2): 3 mL via RESPIRATORY_TRACT
  Filled 2023-09-13 (×2): qty 3

## 2023-09-13 MED ORDER — ACETAMINOPHEN 500 MG PO TABS
1000.0000 mg | ORAL_TABLET | Freq: Once | ORAL | Status: AC
  Administered 2023-09-13: 1000 mg via ORAL
  Filled 2023-09-13: qty 2

## 2023-09-13 MED ORDER — DOXYCYCLINE HYCLATE 100 MG IV SOLR: 100.0000 mg | Freq: Two times a day (BID) | INTRAVENOUS | Status: DC

## 2023-09-13 MED ORDER — PROCHLORPERAZINE EDISYLATE 10 MG/2ML IJ SOLN: 5.0000 mg | Freq: Four times a day (QID) | INTRAMUSCULAR | Status: DC | PRN

## 2023-09-13 MED ORDER — GUAIFENESIN-DM 100-10 MG/5ML PO SYRP: 5.0000 mL | ORAL_SOLUTION | ORAL | Status: DC | PRN

## 2023-09-13 MED ORDER — MELATONIN 5 MG PO TABS: 5.0000 mg | ORAL_TABLET | Freq: Every evening | ORAL | Status: DC | PRN

## 2023-09-13 MED ORDER — LACTATED RINGERS IV BOLUS (SEPSIS)
500.0000 mL | Freq: Once | INTRAVENOUS | Status: AC
  Administered 2023-09-13: 500 mL via INTRAVENOUS

## 2023-09-13 MED ORDER — POLYETHYLENE GLYCOL 3350 17 G PO PACK
17.0000 g | PACK | Freq: Every day | ORAL | Status: DC | PRN
Start: 2023-09-13 — End: 2023-09-16

## 2023-09-13 MED ORDER — IOHEXOL 350 MG/ML SOLN
75.0000 mL | Freq: Once | INTRAVENOUS | Status: AC | PRN
  Administered 2023-09-13: 75 mL via INTRAVENOUS

## 2023-09-13 MED ORDER — LACTATED RINGERS IV SOLN: INTRAVENOUS | Status: DC

## 2023-09-13 MED ORDER — OSELTAMIVIR PHOSPHATE 75 MG PO CAPS: 75.0000 mg | ORAL_CAPSULE | Freq: Two times a day (BID) | ORAL | Status: DC

## 2023-09-13 MED ORDER — METHYLPREDNISOLONE SODIUM SUCC 40 MG IJ SOLR
40.0000 mg | Freq: Every day | INTRAMUSCULAR | Status: AC
  Administered 2023-09-14 – 2023-09-16 (×3): 40 mg via INTRAVENOUS
  Filled 2023-09-13 (×3): qty 1

## 2023-09-13 MED ORDER — ACETAMINOPHEN 325 MG PO TABS
650.0000 mg | ORAL_TABLET | Freq: Four times a day (QID) | ORAL | Status: DC | PRN
Start: 2023-09-13 — End: 2023-09-16
  Administered 2023-09-14 – 2023-09-15 (×3): 650 mg via ORAL
  Filled 2023-09-13 (×3): qty 2

## 2023-09-13 MED ORDER — OSELTAMIVIR PHOSPHATE 30 MG PO CAPS
30.0000 mg | ORAL_CAPSULE | Freq: Two times a day (BID) | ORAL | Status: DC
  Administered 2023-09-14 – 2023-09-16 (×5): 30 mg via ORAL
  Filled 2023-09-13 (×6): qty 1

## 2023-09-13 MED ORDER — OSELTAMIVIR PHOSPHATE 75 MG PO CAPS
75.0000 mg | ORAL_CAPSULE | Freq: Once | ORAL | Status: AC
  Administered 2023-09-14: 75 mg via ORAL
  Filled 2023-09-13: qty 1

## 2023-09-13 MED ORDER — SODIUM CHLORIDE 0.9 % IV SOLN
2.0000 g | Freq: Two times a day (BID) | INTRAVENOUS | Status: DC
  Administered 2023-09-13 – 2023-09-15 (×4): 2 g via INTRAVENOUS
  Filled 2023-09-13 (×4): qty 12.5

## 2023-09-13 MED ORDER — SODIUM CHLORIDE 0.9 % IV SOLN: 1.0000 g | INTRAVENOUS | Status: DC

## 2023-09-13 MED ORDER — FUROSEMIDE 10 MG/ML IJ SOLN
20.0000 mg | Freq: Once | INTRAMUSCULAR | Status: AC
  Administered 2023-09-13: 20 mg via INTRAVENOUS
  Filled 2023-09-13: qty 2

## 2023-09-13 MED ORDER — VANCOMYCIN HCL IN DEXTROSE 1-5 GM/200ML-% IV SOLN
1000.0000 mg | Freq: Once | INTRAVENOUS | Status: AC
  Administered 2023-09-13: 1000 mg via INTRAVENOUS
  Filled 2023-09-13: qty 200

## 2023-09-13 NOTE — ED Notes (Signed)
 Pt is 96% on 4 L nasal cannula. Pt sts this is her baseline

## 2023-09-13 NOTE — Sepsis Progress Note (Signed)
 Elink monitoring for the code sepsis protocol.

## 2023-09-13 NOTE — Telephone Encounter (Signed)
 Attempted to call patient, no answer left message requesting a call back.

## 2023-09-13 NOTE — Telephone Encounter (Signed)
 ATC X1. Lmtcb

## 2023-09-13 NOTE — ED Notes (Signed)
 Pt admitted to [facility]. Confirmed CSN and MRN number with tech Nilda Riggs.

## 2023-09-13 NOTE — Telephone Encounter (Signed)
 I attempted to call patient again today to check-in. Left a message asking patient to call make. I will send her a MyChart message as well.  Corrin Parker, PA-C 09/13/2023 1:08 PM

## 2023-09-13 NOTE — ED Triage Notes (Signed)
 Pt BIB [facility] from home with increased SHOB x2 days. Pt has hx COPD and CHF. Per EMS 87% O2 sat on her baseline O2 2LNC, 94% on 6LNC. Per EMS pt has rales in lower lobes and wheezing in uppers. 1 duoneb and 125mg  solumedrol given by EMS. All other VSS.

## 2023-09-13 NOTE — Telephone Encounter (Signed)
 She was admitted for acute HF exacerbation. Possible the cough is coming from volume overload. If she's had any weight gain or is having any swelling, needs to call cardiology. I will send in doxycycline for 7 days and prednisone taper to cover for possible COPD exacerbation. Wear sunscreen with doxycycline as it increases risk for sunburns. Use mucinex Twice daily for congestion. Use nebs 2-3 times a day until symptoms improve. ED if symptoms fail to improve or worsen. Thanks.

## 2023-09-13 NOTE — ED Notes (Signed)
 ED TO INPATIENT HANDOFF REPORT  ED Nurse Name and Phone #: Minus Liberty RN [phone]  S Name/Age/Gender Dana Pruitt 83 y.o. female Room/Bed: 034C/034C  Code Status   Code Status: Full Code  Home/SNF/Other Home Patient oriented to: self, place, time, and situation Is this baseline? Yes   Triage Complete: Triage complete  Chief Complaint Acute on chronic hypoxic respiratory failure (HCC) [J96.21]  Triage Note Pt BIB [facility] from home with increased SHOB x2 days. Pt has hx COPD and CHF. Per EMS 87% O2 sat on her baseline O2 2LNC, 94% on 6LNC. Per EMS pt has rales in lower lobes and wheezing in uppers. 1 duoneb and 125mg  solumedrol given by EMS. All other VSS.    Allergies No Known Allergies  Level of Care/Admitting Diagnosis ED Disposition     ED Disposition  Admit   Condition  --   Comment  Hospital Area: [facility] [[id number]]  Level of Care: Telemetry Cardiac [[id number]]  May admit patient to [facility] or [facility] if equivalent level of care is available:: Yes  Covid Evaluation: Asymptomatic - no recent exposure (last 10 days) testing not required  Diagnosis: Acute on chronic hypoxic respiratory failure [facility]) [[id number]]  Admitting Physician: Darlin Drop [[id number]]  Attending Physician: Darlin Drop [[id number]]  Certification:: I certify this patient will need inpatient services for at least 2 midnights  Expected Medical Readiness: 09/15/2023          B Medical/Surgery History Past Medical History:  Diagnosis Date   Anemia    "several times over the years" (10/17/2012)   Anxiety    Breast cancer (HCC) 02/2010   right breast   CAD (coronary artery disease)    a. LHC 12/2018: mild non-obstructive CAD   Carcinoma of breast treated with adjuvant hormone therapy (HCC) 02/2010   Femara   Chronic systolic CHF    a. Echo in 2015: LVEF of 35-40%, b. 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 SN: [id number];  Surgeon: Donata Clay, Theron Arista, MD;  Location: [facility] OR;  Service: Open Heart Surgery;  Laterality: N/A;   APPENDECTOMY  1974   BREAST BIOPSY Right 02/27/10   Needle core Biopsy; Invasive Mammary; ER/PR Positive, Her-2 Neu negative, Ki-67 22%   BREAST LUMPECTOMY WITH SENTINEL LYMPH NODE BIOPSY Right 07/03/11   Invasive Ductal Carcinoma;0/3 nodes negative,; ER,PR Positive, Her-2 Neg; Ki-67 22%   CESAREAN SECTION  1970; 1974   COLONOSCOPY     DILATION AND CURETTAGE OF UTERUS  1970's   RIGHT HEART CATH AND CORONARY ANGIOGRAPHY N/A 08/31/2023   Procedure: RIGHT HEART CATH AND CORONARY ANGIOGRAPHY;  Surgeon: Tonny Bollman, MD;  Location: [facility] INVASIVE CV LAB;  Service: Cardiovascular;  Laterality: N/A;  RIGHT/LEFT HEART CATH AND CORONARY ANGIOGRAPHY N/A 01/02/2019   Procedure: RIGHT/LEFT HEART CATH AND CORONARY ANGIOGRAPHY;  Surgeon: Laurey Morale, MD;  Location: [facility] INVASIVE CV LAB;  Service: Cardiovascular;  Laterality: N/A;   TEE WITHOUT CARDIOVERSION N/A 01/31/2019   Procedure: TRANSESOPHAGEAL ECHOCARDIOGRAM (TEE);  Surgeon: Donata Clay, Theron Arista, MD;  Location: [facility] OR;  Service: Open Heart Surgery;  Laterality: N/A;   TUBAL LIGATION  1974   WISDOM TOOTH EXTRACTION       A IV Location/Drains/Wounds Patient Lines/Drains/Airways Status     Active  Line/Drains/Airways     Name Placement date Placement time Site Days   Peripheral IV 09/13/23 18 G Anterior;Left Forearm 09/13/23  1436  Forearm  less than 1   Peripheral IV 09/13/23 20 G Anterior;Distal;Right Forearm 09/13/23  1541  Forearm  less than 1            Intake/Output Last 24 hours  Intake/Output Summary (Last 24 hours) at 09/13/2023 2149 Last data filed at 09/13/2023 2118 Gross per 24 hour  Intake --  Output 650 ml  Net -650 ml    Labs/Imaging Results for orders placed or performed during the hospital encounter of 09/13/23 (from the past 48 hours)  Resp panel by RT-PCR (RSV, Flu A&B, Covid) Anterior Nasal Swab     Status: Abnormal   Collection Time: 09/13/23  3:01 PM   Specimen: Anterior Nasal Swab  Result Value Ref Range   SARS Coronavirus 2 by RT PCR NEGATIVE NEGATIVE   Influenza A by PCR POSITIVE (A) NEGATIVE   Influenza B by PCR NEGATIVE NEGATIVE    Comment: (NOTE) The Xpert Xpress SARS-CoV-2/FLU/RSV plus assay is intended as an aid in the diagnosis of influenza from Nasopharyngeal swab specimens and should not be used as a sole basis for treatment. Nasal washings and aspirates are unacceptable for Xpert Xpress SARS-CoV-2/FLU/RSV testing.  Fact Sheet for Patients: BloggerCourse.com  Fact Sheet for Healthcare Providers: SeriousBroker.it  This test is not yet approved or cleared by the [place] FDA and has been authorized for detection and/or diagnosis of SARS-CoV-2 by FDA under an Emergency Use Authorization (EUA). This EUA will remain in effect (meaning this test can be used) for the duration of the COVID-19 declaration under Section 564(b)(1) of the Act, 21 U.S.C. section 360bbb-3(b)(1), unless the authorization is terminated or revoked.     Resp Syncytial Virus by PCR NEGATIVE NEGATIVE    Comment: (NOTE) Fact Sheet for Patients: BloggerCourse.com  Fact Sheet for  Healthcare Providers: SeriousBroker.it  This test is not yet approved or cleared by the [place] FDA and has been authorized for detection and/or diagnosis of SARS-CoV-2 by FDA under an Emergency Use Authorization (EUA). This EUA will remain in effect (meaning this test can be used) for the duration of the COVID-19 declaration under Section 564(b)(1) of the Act, 21 U.S.C. section 360bbb-3(b)(1), unless the authorization is terminated or revoked.  Performed at [facility], [address] [place], [place] [address]   Urinalysis, w/ Reflex to Culture (Infection Suspected) -Urine, Clean Catch     Status: Abnormal   Collection Time: 09/13/23  3:01 PM  Result Value Ref Range   Specimen Source URINE, CLEAN CATCH    Color, Urine YELLOW YELLOW   APPearance CLEAR CLEAR   Specific Gravity, Urine 1.012 1.005 - 1.030   pH 5.0 5.0 - 8.0   Glucose, UA NEGATIVE NEGATIVE mg/dL   Hgb urine dipstick SMALL (A) NEGATIVE   Bilirubin Urine NEGATIVE NEGATIVE  Ketones, ur 20 (A) NEGATIVE mg/dL   Protein, ur NEGATIVE NEGATIVE mg/dL   Nitrite NEGATIVE NEGATIVE   Leukocytes,Ua NEGATIVE NEGATIVE   RBC / HPF 0-5 0 - 5 RBC/hpf   WBC, UA 0-5 0 - 5 WBC/hpf    Comment:        Reflex urine culture not performed if WBC <=10, OR if Squamous epithelial cells >5. If Squamous epithelial cells >5 suggest recollection.    Bacteria, UA RARE (A) NONE SEEN   Squamous Epithelial / HPF 0-5 0 - 5 /HPF   Mucus PRESENT     Comment: Performed at [facility], [address] [place], [place] [address]  Comprehensive metabolic panel     Status: Abnormal   Collection Time: 09/13/23  3:34 PM  Result Value Ref Range   Sodium 130 (L) 135 - 145 mmol/L   Potassium 3.4 (L) 3.5 - 5.1 mmol/L   Chloride 96 (L) 98 - 111 mmol/L   CO2 23 22 - 32 mmol/L   Glucose, Bld 137 (H) 70 - 99 mg/dL    Comment: Glucose reference range applies only to samples taken after fasting for at least 8  hours.   BUN 14 8 - 23 mg/dL   Creatinine, Ser 0.98 0.44 - 1.00 mg/dL   Calcium 9.2 8.9 - 11.9 mg/dL   Total Protein 6.5 6.5 - 8.1 g/dL   Albumin 3.2 (L) 3.5 - 5.0 g/dL   AST 25 15 - 41 U/L   ALT 28 0 - 44 U/L   Alkaline Phosphatase 43 38 - 126 U/L   Total Bilirubin 1.1 0.0 - 1.2 mg/dL   GFR, Estimated >14 >78 mL/min    Comment: (NOTE) Calculated using the CKD-EPI Creatinine Equation (2021)    Anion gap 11 5 - 15    Comment: Performed at [facility], [address] [place], [place] [address]  CBC with Differential     Status: Abnormal   Collection Time: 09/13/23  3:34 PM  Result Value Ref Range   WBC 9.1 4.0 - 10.5 K/uL   RBC 3.10 (L) 3.87 - 5.11 MIL/uL   Hemoglobin 9.9 (L) 12.0 - 15.0 g/dL   HCT 13.0 (L) 86.5 - 78.4 %   MCV 94.5 80.0 - 100.0 fL   MCH 31.9 26.0 - 34.0 pg   MCHC 33.8 30.0 - 36.0 g/dL   RDW 69.6 29.5 - 28.4 %   Platelets 277 150 - 400 K/uL   nRBC 0.0 0.0 - 0.2 %   Neutrophils Relative % 92 %   Neutro Abs 8.4 (H) 1.7 - 7.7 K/uL   Lymphocytes Relative 3 %   Lymphs Abs 0.3 (L) 0.7 - 4.0 K/uL   Monocytes Relative 3 %   Monocytes Absolute 0.3 0.1 - 1.0 K/uL   Eosinophils Relative 0 %   Eosinophils Absolute 0.0 0.0 - 0.5 K/uL   Basophils Relative 0 %   Basophils Absolute 0.0 0.0 - 0.1 K/uL   Immature Granulocytes 2 %   Abs Immature Granulocytes 0.14 (H) 0.00 - 0.07 K/uL    Comment: Performed at [facility], [address] [place], [place] [address]  Protime-INR     Status: Abnormal   Collection Time: 09/13/23  3:34 PM  Result Value Ref Range   Prothrombin Time 17.1 (H) 11.4 - 15.2 seconds   INR 1.4 (H) 0.8 - 1.2    Comment: (NOTE) INR goal varies based on device and disease states. Performed at [facility], [address] [place], [place] [address]   APTT     Status: None   Collection Time: 09/13/23  3:34 PM  Result Value Ref Range   aPTT 32 24 - 36 seconds    Comment: Performed at [facility], [address],  [place], [place] [address]  Lipase, blood     Status: None   Collection Time: 09/13/23  3:34 PM  Result Value Ref Range   Lipase 26 11 - 51 U/L    Comment: Performed at [facility], [address] [place] [address] [place], [place] [address]  Brain natriuretic peptide     Status: Abnormal   Collection Time: 09/13/23  3:34 PM  Result Value Ref Range   B Natriuretic Peptide 905.0 (H) 0.0 - 100.0 pg/mL    Comment: Performed at [facility], [address] [place], [place] [address]  Troponin I (High Sensitivity)     Status: Abnormal   Collection Time: 09/13/23  3:34 PM  Result Value Ref Range   Troponin I (High Sensitivity) 55 (H) <18 ng/L    Comment: (NOTE) Elevated high sensitivity troponin I (hsTnI) values and significant  changes across serial measurements may suggest ACS but many other  chronic and acute conditions are known to elevate hsTnI results.  Refer to the "Links" section for chest pain algorithms and additional  guidance. Performed at [facility], [address] [place], [place] [address]   I-Stat venous blood gas, ED ([facility])     Status: Abnormal   Collection Time: 09/13/23  3:46 PM  Result Value Ref Range   pH, Ven 7.421 7.25 - 7.43   pCO2, Ven 36.5 (L) 44 - 60 mmHg   pO2, Ven 40 32 - 45 mmHg   Bicarbonate 23.7 20.0 - 28.0 mmol/L   TCO2 25 22 - 32 mmol/L   O2 Saturation 77 %   Acid-base deficit 1.0 0.0 - 2.0 mmol/L   Sodium 131 (L) 135 - 145 mmol/L   Potassium 3.5 3.5 - 5.1 mmol/L   Calcium, Ion 1.19 1.15 - 1.40 mmol/L   HCT 29.0 (L) 36.0 - 46.0 %   Hemoglobin 9.9 (L) 12.0 - 15.0 g/dL   Sample type VENOUS   I-Stat Chem 8, ED     Status: Abnormal   Collection Time: 09/13/23  3:46 PM  Result Value Ref Range   Sodium 131 (L) 135 - 145 mmol/L   Potassium 3.5 3.5 - 5.1 mmol/L   Chloride 98 98 - 111 mmol/L   BUN 13 8 - 23 mg/dL   Creatinine, Ser 1.30 0.44 - 1.00 mg/dL   Glucose, Bld 865 (H) 70 - 99 mg/dL    Comment: Glucose reference range applies only to  samples taken after fasting for at least 8 hours.   Calcium, Ion 1.16 1.15 - 1.40 mmol/L   TCO2 21 (L) 22 - 32 mmol/L   Hemoglobin 10.2 (L) 12.0 - 15.0 g/dL   HCT 78.4 (L) 69.6 - 29.5 %  I-Stat Lactic Acid, ED     Status: None   Collection Time: 09/13/23  3:47 PM  Result Value Ref Range   Lactic Acid, Venous 1.2 0.5 - 1.9 mmol/L  Troponin I (High Sensitivity)     Status: Abnormal   Collection Time: 09/13/23  6:54 PM  Result Value Ref Range   Troponin I (High Sensitivity) 50 (H) <18 ng/L    Comment: (NOTE) Elevated high sensitivity troponin I (hsTnI) values  and significant  changes across serial measurements may suggest ACS but many other  chronic and acute conditions are known to elevate hsTnI results.  Refer to the "Links" section for chest pain algorithms and additional  guidance. Performed at [facility], [address] [place], [place] [address]    CT Angio Chest PE W and/or Wo Contrast Result Date: 09/13/2023 CLINICAL DATA:  Pulmonary embolism suspected, high probability. EXAM: CT ANGIOGRAPHY CHEST WITH CONTRAST TECHNIQUE: Multidetector CT imaging of the chest was performed using the standard protocol during bolus administration of intravenous contrast. Multiplanar CT image reconstructions and MIPs were obtained to evaluate the vascular anatomy. RADIATION DOSE REDUCTION: This exam was performed according to the departmental dose-optimization program which includes automated exposure control, adjustment of the mA and/or kV according to patient size and/or use of iterative reconstruction technique. CONTRAST:  75mL OMNIPAQUE IOHEXOL 350 MG/ML SOLN COMPARISON:  11/04/2022. FINDINGS: Cardiovascular: The heart is enlarged and there is no pericardial effusion. Three-vessel coronary artery calcifications are noted. There is atherosclerotic calcification of the aorta with aneurysmal dilatation of the ascending aorta measuring 4.5 cm. The pulmonary trunk is distended measuring 4.3 cm. No  evidence of pulmonary embolism is seen. Mediastinum/Nodes: No enlarged mediastinal, hilar, or axillary lymph nodes. Thyroid gland, trachea, and esophagus demonstrate no significant findings. Lungs/Pleura: Paraseptal and centrilobular emphysematous changes are present in the lungs. Biapical pleural scarring is noted. There is bronchiectasis bilaterally with consolidation in the lingular segment of the left upper lobe and the medial aspect of the left lower lobe. Fibrotic changes are noted in the anterior aspect of the right upper lobe. No effusion or pneumothorax is seen. A few scattered pulmonary nodules are noted in the right upper lobe measuring up to 3 mm, not significantly changed from the prior exam. No new nodule is seen. Upper Abdomen: Stones are present within the gallbladder. No acute abnormality. Musculoskeletal: Sternotomy wires are noted. Degenerative changes are present in the thoracic spine. Review of the MIP images confirms the above findings. IMPRESSION: 1. No evidence of pulmonary embolism. 2. Focal consolidation in the left upper and lower lobes, possible atelectasis or infiltrate. 3. Emphysema. 4. Cardiomegaly with coronary artery calcifications. 5. Distended pulmonary trunk which may be associated with pulmonary artery hypertension. 6. Aortic atherosclerosis with aneurysmal dilatation of the ascending aorta measuring 4.5 cm. Ascending thoracic aortic aneurysm. Recommend semi-annual imaging followup by CTA or MRA and referral to cardiothoracic surgery if not already obtained. This recommendation follows 2010 ACCF/AHA/AATS/ACR/ASA/SCA/SCAI/SIR/STS/SVM Guidelines for the Diagnosis and Management of Patients With Thoracic Aortic Disease. Circulation. 2010; 121: B284-X324. Aortic aneurysm NOS (ICD10-I71.9) Electronically Signed   By: Thornell Sartorius M.D.   On: 09/13/2023 21:12   DG Chest Port 1 View Result Date: 09/13/2023 CLINICAL DATA:  Possible sepsis EXAM: PORTABLE CHEST 1 VIEW COMPARISON:   08/30/2023, 08/16/2023, CT chest 11/04/2022 FINDINGS: Emphysema. Increased interstitial opacities and patchy peripheral left lower lung opacity compared to prior. Sternotomy and valve prosthesis. Stable cardiomediastinal silhouette with aortic atherosclerosis. No pneumothorax IMPRESSION: Emphysema. Increased interstitial opacities and patchy peripheral left lower lung opacity compared to prior, possible pneumonia. Electronically Signed   By: Jasmine Pang M.D.   On: 09/13/2023 18:06    Pending Labs Unresulted Labs (From admission, onward)     Start     Ordered   09/14/23 0500  CBC  Tomorrow morning,   R        09/13/23 2149   09/14/23 0500  Basic metabolic panel  Tomorrow morning,  R        09/13/23 2149   09/14/23 0500  Magnesium  Tomorrow morning,   R        09/13/23 2149   09/14/23 0500  Phosphorus  Tomorrow morning,   R        09/13/23 2149   09/13/23 1501  Blood Culture (routine x 2)  (Septic presentation on arrival (screening labs, nursing and treatment orders for obvious sepsis))  BLOOD CULTURE X 2,   STAT      09/13/23 1501            Vitals/Pain Today's Vitals   09/13/23 2045 09/13/23 2100 09/13/23 2111 09/13/23 2115  BP:   118/73   Pulse:   74 75  Resp: 13 18 (!) 21 14  Temp:      TempSrc:      SpO2:   94% 94%  Weight:      Height:      PainSc:   0-No pain     Isolation Precautions No active isolations  Medications Medications  lactated ringers infusion ( Intravenous Not Given 09/13/23 1947)  amiodarone (PACERONE) tablet 200 mg (has no administration in time range)  oseltamivir (TAMIFLU) capsule 75 mg (has no administration in time range)    Followed by  oseltamivir (TAMIFLU) capsule 30 mg (has no administration in time range)  ceFEPIme (MAXIPIME) 2 g in sodium chloride 0.9 % 100 mL IVPB (0 g Intravenous Stopped 09/13/23 1641)  metroNIDAZOLE (FLAGYL) IVPB 500 mg (0 mg Intravenous Stopped 09/13/23 1903)  vancomycin (VANCOCIN) IVPB 1000 mg/200 mL premix (0 mg  Intravenous Stopped 09/13/23 1903)  lactated ringers bolus 500 mL (0 mLs Intravenous Stopped 09/13/23 1641)  acetaminophen (TYLENOL) tablet 1,000 mg (1,000 mg Oral Given 09/13/23 1558)  methylPREDNISolone sodium succinate (SOLU-MEDROL) 125 mg/2 mL injection 125 mg (125 mg Intravenous Given 09/13/23 1559)  ipratropium-albuterol (DUONEB) 0.5-2.5 (3) MG/3ML nebulizer solution 3 mL (3 mLs Nebulization Given 09/13/23 1603)  iohexol (OMNIPAQUE) 350 MG/ML injection 75 mL (75 mLs Intravenous Contrast Given 09/13/23 1947)  furosemide (LASIX) injection 20 mg (20 mg Intravenous Given 09/13/23 2110)    Mobility walks with person assist     Focused Assessments Pulmonary Assessment Handoff:  Lung sounds: L Breath Sounds: Rales R Breath Sounds: Rales O2 Device: Nasal Cannula O2 Flow Rate (L/min): 4 L/min    R Recommendations: See Admitting Provider Note  Report given to:   Additional Notes: - pt is at baseline 4 L [place]. Flu+ productive white/yellow cough. BNP elevated given IV lasix see mar. 650 output

## 2023-09-13 NOTE — Telephone Encounter (Signed)
 Patient is returning phone call. Patient phone number is [phone].

## 2023-09-13 NOTE — ED Provider Notes (Signed)
 [facility] EMERGENCY DEPARTMENT AT [facility] Provider Note   CSN: [id number] Arrival date & time: 09/13/23  1416    History  Chief Complaint  Patient presents with   Shortness of Breath    Dana Pruitt is a 83 y.o. female here for evaluation of shortness of breath and feeling unwell.  A recent hospital admission about 2 weeks ago for chest pain.  Had a heart cath which did not show any significant occlusion however was noted to have a worsening ejection fraction of 30 to 35%.  He is on chronic 4 L of oxygen due to COPD.  Patient states she has been feeling poorly since discharge however over the last few days she has had significant weakness, unable to do much due to shortness of breath.  She denies any PND, orthopnea, lower extremity swelling.  She is using her nebulizers multiple times at home without significant relief.  Did notes that she had steroids a few weeks ago which helped with her shortness of breath however she has not been on these recently.  She has had some chills however no documented fever.  No headache.  No chest pain.  No abdominal pain, nausea, vomiting.  She has had some near syncopal/episodes where she fell like she is going to fall due to her shortness of breath and weakness.  When she was last hospitalized she was also noted to have a pulmonary nodule with supposed to follow-up with pulmonology.  She has a chronic cough however has worsened and now productive of yellow and green sputum  HPI     Home Medications Prior to Admission medications   Medication Sig Start Date End Date Taking? Authorizing Provider  albuterol (PROAIR HFA) 108 (90 Base) MCG/ACT inhaler 2 puffs every 4 hours as needed only  if your can't catch your breath 09/14/22   Nyoka Cowden, MD  albuterol (PROVENTIL) (2.5 MG/3ML) 0.083% nebulizer solution Take 3 mLs (2.5 mg total) by nebulization every 6 (six) hours as needed for wheezing or shortness of breath. 08/16/23   Cobb, Ruby Cola,  NP  amiodarone (PACERONE) 200 MG tablet Take 1 tablet (200 mg total) by mouth daily. 09/02/23   Parcells, Therisa Doyne, PA-C  atorvastatin (LIPITOR) 20 MG tablet Take 20 mg by mouth daily.  02/12/14   [provider]  bisoprolol (ZEBETA) 5 MG tablet Take 0.5 tablets (2.5 mg total) by mouth daily. 09/03/23   Parcells, Therisa Doyne, PA-C  BREZTRI AEROSPHERE 160-9-4.8 MCG/ACT AERO INHALE 2 PUFFS INTO THE LUNGS TWICE DAILY 02/02/23   Nyoka Cowden, MD  Cholecalciferol (VITAMIN D3) 25 MCG (1000 UT) CAPS Take 1,000 Units by mouth daily. 02/10/23   [provider]  doxycycline (VIBRA-TABS) 100 MG tablet Take 1 tablet (100 mg total) by mouth 2 (two) times daily. 09/13/23   Cobb, Ruby Cola, NP  ELIQUIS 2.5 MG TABS tablet TAKE 1 TABLET(2.5 MG) BY MOUTH TWICE DAILY 01/28/23   Runell Gess, MD  ezetimibe (ZETIA) 10 MG tablet Take 10 mg by mouth daily.    [provider]  FEROSUL 325 (65 Fe) MG tablet Take 325 mg by mouth every other day. 05/08/23   [provider]  fluticasone (FLONASE) 50 MCG/ACT nasal spray SHAKE LIQUID AND USE 2 SPRAYS IN EACH NOSTRIL DAILY 03/18/23   Nyoka Cowden, MD  guaiFENesin (MUCINEX) 600 MG 12 hr tablet Take 1 tablet (600 mg total) by mouth 2 (two) times daily. 02/07/19   Rowe Clack,  PA-C  ipratropium (ATROVENT) 0.06 % nasal spray Place 2 sprays into both nostrils 4 (four) times daily. 08/16/23   Cobb, Ruby Cola, NP  losartan (COZAAR) 25 MG tablet Take 25 mg by mouth daily.    [provider]  predniSONE (DELTASONE) 10 MG tablet 4 tabs for 2 days, then 3 tabs for 2 days, 2 tabs for 2 days, then 1 tab for 2 days, then stop 09/13/23   Cobb, Ruby Cola, NP  sertraline (ZOLOFT) 100 MG tablet Take 200 mg by mouth daily.    [provider]  valACYclovir (VALTREX) 1000 MG tablet Take 500 mg by mouth as needed.    [provider]      Allergies    Patient has no known allergies.    Review of Systems   Review of Systems   Constitutional:  Positive for activity change, appetite change, chills and fatigue.  HENT:  Positive for congestion. Negative for sore throat.   Respiratory:  Positive for cough, chest tightness and shortness of breath.   Cardiovascular: Negative.   Gastrointestinal: Negative.   Genitourinary: Negative.   Musculoskeletal:  Positive for myalgias.  Skin: Negative.   Neurological:  Positive for weakness and light-headedness (with ambulation).  All other systems reviewed and are negative.   Physical Exam Updated Vital Signs BP 136/79   Pulse 72   Temp 97.9 F (36.6 C) (Oral)   Resp 18   Ht 5\' 4"  (1.626 m)   Wt 52.6 kg   LMP  (LMP Unknown)   SpO2 96%   BMI 19.91 kg/m  Physical Exam Vitals and nursing note reviewed.  Constitutional:      General: She is not in acute distress.    Appearance: She is well-developed. She is ill-appearing. She is not toxic-appearing or diaphoretic.  HENT:     Head: Atraumatic.     Mouth/Throat:     Mouth: Mucous membranes are moist.  Eyes:     Pupils: Pupils are equal, round, and reactive to light.  Cardiovascular:     Rate and Rhythm: Normal rate.     Pulses: Normal pulses.          Radial pulses are 2+ on the right side and 2+ on the left side.       Dorsalis pedis pulses are 2+ on the right side and 2+ on the left side.     Heart sounds: Normal heart sounds.  Pulmonary:     Effort: No respiratory distress.     Breath sounds: Wheezing and rhonchi present.     Comments: Diffuse rhonchi lower lobes.  Mild wheeze throughout.  Speaks in short sentences with having to pause to take a deep breath Abdominal:     General: Bowel sounds are normal. There is no distension.     Palpations: Abdomen is soft.  Musculoskeletal:        General: Normal range of motion.     Cervical back: Normal range of motion.     Right lower leg: No tenderness. No edema.     Left lower leg: No tenderness. No edema.     Comments: No bony tenderness, compartments soft.   No lower extremity edema  Skin:    General: Skin is warm and dry.     Capillary Refill: Capillary refill takes less than 2 seconds.  Neurological:     General: No focal deficit present.     Mental Status: She is alert.  Psychiatric:  Mood and Affect: Mood normal.    ED Results / Procedures / Treatments   Labs (all labs ordered are listed, but only abnormal results are displayed) Labs Reviewed  RESP PANEL BY RT-PCR (RSV, FLU A&B, COVID)  RVPGX2 - Abnormal; Notable for the following components:      Result Value   Influenza A by PCR POSITIVE (*)    All other components within normal limits  COMPREHENSIVE METABOLIC PANEL - Abnormal; Notable for the following components:   Sodium 130 (*)    Potassium 3.4 (*)    Chloride 96 (*)    Glucose, Bld 137 (*)    Albumin 3.2 (*)    All other components within normal limits  CBC WITH DIFFERENTIAL/PLATELET - Abnormal; Notable for the following components:   RBC 3.10 (*)    Hemoglobin 9.9 (*)    HCT 29.3 (*)    Neutro Abs 8.4 (*)    Lymphs Abs 0.3 (*)    Abs Immature Granulocytes 0.14 (*)    All other components within normal limits  PROTIME-INR - Abnormal; Notable for the following components:   Prothrombin Time 17.1 (*)    INR 1.4 (*)    All other components within normal limits  BRAIN NATRIURETIC PEPTIDE - Abnormal; Notable for the following components:   B Natriuretic Peptide 905.0 (*)    All other components within normal limits  URINALYSIS, W/ REFLEX TO CULTURE (INFECTION SUSPECTED) - Abnormal; Notable for the following components:   Hgb urine dipstick SMALL (*)    Ketones, ur 20 (*)    Bacteria, UA RARE (*)    All other components within normal limits  I-STAT VENOUS BLOOD GAS, ED - Abnormal; Notable for the following components:   pCO2, Ven 36.5 (*)    Sodium 131 (*)    HCT 29.0 (*)    Hemoglobin 9.9 (*)    All other components within normal limits  I-STAT CHEM 8, ED - Abnormal; Notable for the following components:    Sodium 131 (*)    Glucose, Bld 137 (*)    TCO2 21 (*)    Hemoglobin 10.2 (*)    HCT 30.0 (*)    All other components within normal limits  TROPONIN I (HIGH SENSITIVITY) - Abnormal; Notable for the following components:   Troponin I (High Sensitivity) 55 (*)    All other components within normal limits  TROPONIN I (HIGH SENSITIVITY) - Abnormal; Notable for the following components:   Troponin I (High Sensitivity) 50 (*)    All other components within normal limits  CULTURE, BLOOD (ROUTINE X 2)  CULTURE, BLOOD (ROUTINE X 2)  APTT  LIPASE, BLOOD  I-STAT CG4 LACTIC ACID, ED    EKG EKG Interpretation Date/Time:  Monday September 13 2023 15:03:07 EDT Ventricular Rate:  102 PR Interval:  170 QRS Duration:  150 QT Interval:  371 QTC Calculation: 484 R Axis:   180  Text Interpretation: Sinus tachycardia Ventricular premature complex No significant change since last tracing Confirmed by Melene Plan [phone]) on 09/13/2023 8:30:14 PM  Radiology DG Chest Port 1 View Result Date: 09/13/2023 CLINICAL DATA:  Possible sepsis EXAM: PORTABLE CHEST 1 VIEW COMPARISON:  08/30/2023, 08/16/2023, CT chest 11/04/2022 FINDINGS: Emphysema. Increased interstitial opacities and patchy peripheral left lower lung opacity compared to prior. Sternotomy and valve prosthesis. Stable cardiomediastinal silhouette with aortic atherosclerosis. No pneumothorax IMPRESSION: Emphysema. Increased interstitial opacities and patchy peripheral left lower lung opacity compared to prior, possible pneumonia. Electronically Signed   By: Selena Batten  Jake Samples M.D.   On: 09/13/2023 18:06    Procedures .Critical Care  Performed by: Linwood Dibbles, PA-C Authorized by: Linwood Dibbles, PA-C   Critical care provider statement:    Critical care time (minutes):  35   Critical care time was exclusive of:  Separately billable procedures and treating other patients and teaching time   Critical care was necessary to treat or prevent imminent  or life-threatening deterioration of the following conditions:  Respiratory failure and sepsis   Critical care was time spent personally by me on the following activities:  Development of treatment plan with patient or surrogate, discussions with consultants, evaluation of patient's response to treatment, examination of patient, ordering and review of laboratory studies, ordering and review of radiographic studies, ordering and performing treatments and interventions, pulse oximetry, re-evaluation of patient's condition and review of old charts     Medications Ordered in ED Medications  lactated ringers infusion ( Intravenous Not Given 09/13/23 1947)  furosemide (LASIX) injection 20 mg (has no administration in time range)  ceFEPIme (MAXIPIME) 2 g in sodium chloride 0.9 % 100 mL IVPB (0 g Intravenous Stopped 09/13/23 1641)  metroNIDAZOLE (FLAGYL) IVPB 500 mg (0 mg Intravenous Stopped 09/13/23 1903)  vancomycin (VANCOCIN) IVPB 1000 mg/200 mL premix (0 mg Intravenous Stopped 09/13/23 1903)  lactated ringers bolus 500 mL (0 mLs Intravenous Stopped 09/13/23 1641)  acetaminophen (TYLENOL) tablet 1,000 mg (1,000 mg Oral Given 09/13/23 1558)  methylPREDNISolone sodium succinate (SOLU-MEDROL) 125 mg/2 mL injection 125 mg (125 mg Intravenous Given 09/13/23 1559)  ipratropium-albuterol (DUONEB) 0.5-2.5 (3) MG/3ML nebulizer solution 3 mL (3 mLs Nebulization Given 09/13/23 1603)  iohexol (OMNIPAQUE) 350 MG/ML injection 75 mL (75 mLs Intravenous Contrast Given 09/13/23 1947)    ED Course/ Medical Decision Making/ A&P Clinical Course as of 09/13/23 2340  Mon Sep 13, 2023  2142 Dr. Margo Aye with medicine to admit [BH]    Clinical Course User Index [BH] Arlett Goold A, PA-C    83 year old complex medical history with chronic hypoxic respiratory failure here for evaluation of feeling unwell.  Recently admitted to the hospital for chest pain workup had a heart cath which did not show any significant cardiac  occlusions however was noted to have a worsening EF of 30 to 35%.  She has felt very poorly since her hospitalization.  Over the last few days she has had increasing shortness of breath, cough nonproductive of green sputum, very weak difficulty getting around due to her weakness.  She has had a few near syncopal episodes.  She was actually seen by cardiology a few days ago noted to be hypoxic on her home oxygen as well as hypotensive however she did not want to go to the hospital.  She comes in today due to worsening symptoms.  On arrival she is febrile, tachypneic, hypoxic on her home oxygen.  She appears ill.  Code sepsis called.  Broad-spectrum antibiotics given.  Given her EF we will only give a gentle fluid bolus.  She is currently normotensive at this time.  Labs and imaging personally viewed and interpreted:  Lactic 1.2 Troponin 50 VBG without hypercarbic respiratory failure Lipase 26 BNP 905--not currently on diuretics at home will give gentle IV diuretic Metabolic panel sodium 130 CBC without leukocytosis, hemoglobin 9.9 baseline around 11 Viral panel positive for influenza UA negative for infection Chest x-ray with emphysema, opacities EKG without ischemic changes  Patient reassessed.  She did get some steroids as well as nebulizer.  She has had  some improvement in her breathing.  States she typically uses 3 to 4 L at home.  She is down from 6-5 currently.  Still gets very dyspneic with any sort of movement.  Will plan on CTA chest  Patient reassessed.  CTA shows edema, infiltrates, cardiomegaly  Will admit for further workup of acute on chronic hypoxic respiratory failure as well as weakness.  I suspect this is likely multifactorial with her influenza as well as COPD and CHF.  Thankfully she does not appear grossly fluid overloaded however does have a BNP that is elevated at this time.  Discussed with Dr. Margo Aye who is agreeable to evaluate for admission  The patient appears  reasonably stabilized for admission considering the current resources, flow, and capabilities available in the ED at this time, and I doubt any other [facility] requiring further screening and/or treatment in the ED prior to admission.                                Medical Decision Making Amount and/or Complexity of Data Reviewed External Data Reviewed: labs, radiology, ECG and notes. Labs: ordered. Decision-making details documented in ED Course. Radiology: ordered and independent interpretation performed. Decision-making details documented in ED Course. ECG/medicine tests: ordered and independent interpretation performed. Decision-making details documented in ED Course.  Risk OTC drugs. Prescription drug management. Parenteral controlled substances. Decision regarding hospitalization. Diagnosis or treatment significantly limited by social determinants of health.           Final Clinical Impression(s) / ED Diagnoses Final diagnoses:  Sepsis with acute hypoxic respiratory failure without septic shock, due to unspecified organism [facility])  COPD exacerbation (HCC)  Acute on chronic congestive heart failure, unspecified heart failure type (HCC)  Influenza A  Weakness    Rx / DC Orders ED Discharge Orders     None         Qiara Minetti A, PA-C 09/13/23 2342    Melene Plan, DO 09/14/23 1455

## 2023-09-13 NOTE — H&P (Incomplete)
 History and Physical  Dana Pruitt OEU:[id number] DOB: December 26, 1940 DOA: 09/13/2023  Referring physician: Aaron Edelman  PCP: Merri Brunette, MD  Outpatient Specialists: Cardiology, pulmonary. Patient coming from: Home.  Chief Complaint: Shortness of breath and cough  HPI: Dana Pruitt is a 83 y.o. female with medical history significant for nonischemic cardiomyopathy EF 30 to 35%, chronic combined diastolic and systolic CHF, paroxysmal A-fib on Eliquis, ascending aortic aneurysm 4.5 cm, CVA in 2020, COPD on 4 L nasal cannula, hypertension, hyperlipidemia, history of breast cancer status post chemotherapy, radiation, lumpectomy in 2013, recently prescribed Macrobid 100 mg twice daily x 5 days by PCP (09/10/2023) for UTI, recently admitted by cardiology on 08/30/2023 and discharged on 09/02/2023 for chest pain for which she had LHC/RHC (08/31/23), who presents to the ER from home with complaints of shortness of breath and a productive cough with greenish sputum .  Associated with subjective fevers and generalized weakness.  Symptoms started sometimes after her discharge from the hospital.  Presents to the ER for further evaluation.  In the ER, weak appearing, tachypneic.  Influenza A+.  CT angio negative for pulmonary embolism however showed focal consolidation in the left upper and lower lobes, possible atelectasis or infiltrates.  Emphysema.  Cardiomegaly with coronary artery calcifications, distended pulmonary trunk which may be associated with pulmonary artery hypertension.  Aortic atherosclerosis with aneurysm dilatation of the ascending aorta measuring 4.5 cm.  Lab work was notable for BNP greater than 900.  The patient received 1 dose of IV Lasix 20 mg x 1, nebulizer treatment, IV Solu-Medrol, IV vancomycin x 1, IV Flagyl x 1, and IV cefepime x 1.  TRH, hospitalist service, was asked to admit.  ED Course: Temp 37.8.  BP 132/90, pulse 73, respiration 20, O2 saturation 93% on 4 L.  Lab studies  notable for serum sodium 131, potassium 3.5, glucose 137, BUN 13, creatinine 0.80, GFR greater than 60.  BNP 905.  Troponin 55, repeat 50.  Lactic acid 1.2.  WBC 9.1, hemoglobin 9.9, platelet count 277.  Neutrophil count 8.4.  Influenza A positive.  Review of Systems: Review of systems as noted in the HPI. All other systems reviewed and are negative.   Past Medical History:  Diagnosis Date   Anemia    "several times over the years" (10/17/2012)   Anxiety    Breast cancer (HCC) 02/2010   right breast   CAD (coronary artery disease)    a. LHC 12/2018: mild non-obstructive CAD   Carcinoma of breast treated with adjuvant hormone therapy (HCC) 02/2010   Femara   Chronic systolic CHF    a. Echo in 2015: LVEF of 35-40%, b. 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 SN: [id number];  Surgeon: Zenaida Niece  Greg Cutter, MD;  Location: [facility] OR;  Service: Open Heart Surgery;  Laterality: N/A;   APPENDECTOMY  1974   BREAST BIOPSY Right 02/27/10   Needle core Biopsy; Invasive Mammary; ER/PR Positive, Her-2 Neu negative, Ki-67 22%   BREAST LUMPECTOMY WITH SENTINEL LYMPH NODE BIOPSY Right 07/03/11   Invasive Ductal Carcinoma;0/3 nodes negative,; ER,PR Positive, Her-2 Neg; Ki-67 22%   CESAREAN SECTION  1970; 1974    COLONOSCOPY     DILATION AND CURETTAGE OF UTERUS  1970's   RIGHT HEART CATH AND CORONARY ANGIOGRAPHY N/A 08/31/2023   Procedure: RIGHT HEART CATH AND CORONARY ANGIOGRAPHY;  Surgeon: Tonny Bollman, MD;  Location: [facility] INVASIVE CV LAB;  Service: Cardiovascular;  Laterality: N/A;   RIGHT/LEFT HEART CATH AND CORONARY ANGIOGRAPHY N/A 01/02/2019   Procedure: RIGHT/LEFT HEART CATH AND CORONARY ANGIOGRAPHY;  Surgeon: Laurey Morale, MD;  Location: [facility] INVASIVE CV LAB;  Service: Cardiovascular;  Laterality: N/A;   TEE WITHOUT CARDIOVERSION N/A 01/31/2019   Procedure: TRANSESOPHAGEAL ECHOCARDIOGRAM (TEE);  Surgeon: Donata Clay, Theron Arista, MD;  Location: [facility] OR;  Service: Open Heart Surgery;  Laterality: N/A;   TUBAL LIGATION  1974   WISDOM TOOTH EXTRACTION      Social History:  reports that she quit smoking about 33 years ago. Her smoking use included cigarettes. She started smoking about 65 years ago. She has a 96.3 pack-year smoking history. She has never used smokeless tobacco. She reports that she does not currently use alcohol. She reports that she does not use drugs.   No Known Allergies  Family History  Problem Relation Age of Onset   Heart disease Mother    Cancer Father    Heart disease Maternal Grandmother    Heart disease Maternal Grandfather    Cancer Paternal Grandmother    Lung disease Neg Hx       Prior to Admission medications   Medication Sig Start Date End Date Taking? Authorizing Provider  albuterol (PROAIR HFA) 108 (90 Base) MCG/ACT inhaler 2 puffs every 4 hours as needed only  if your can't catch your breath 09/14/22   Nyoka Cowden, MD  albuterol (PROVENTIL) (2.5 MG/3ML) 0.083% nebulizer solution Take 3 mLs (2.5 mg total) by nebulization every 6 (six) hours as needed for wheezing or shortness of breath. 08/16/23   Cobb, Ruby Cola, NP  amiodarone (PACERONE) 200 MG tablet Take 1 tablet (200 mg total) by mouth daily. 09/02/23   Parcells, Therisa Doyne, PA-C  atorvastatin (LIPITOR) 20  MG tablet Take 20 mg by mouth daily.  02/12/14   [provider]  bisoprolol (ZEBETA) 5 MG tablet Take 0.5 tablets (2.5 mg total) by mouth daily. 09/03/23   Parcells, Therisa Doyne, PA-C  BREZTRI AEROSPHERE 160-9-4.8 MCG/ACT AERO INHALE 2 PUFFS INTO THE LUNGS TWICE DAILY 02/02/23   Nyoka Cowden, MD  Cholecalciferol (VITAMIN D3) 25 MCG (1000 UT) CAPS Take 1,000 Units by mouth daily. 02/10/23   [provider]  doxycycline (VIBRA-TABS) 100 MG tablet Take 1 tablet (100 mg total) by mouth 2 (two) times daily. 09/13/23   Cobb, Ruby Cola, NP  ELIQUIS 2.5 MG TABS tablet TAKE 1 TABLET(2.5 MG) BY MOUTH TWICE DAILY 01/28/23   Runell Gess, MD  ezetimibe (ZETIA) 10 MG tablet Take 10 mg by mouth daily.    [provider]  FEROSUL 325 (65 Fe) MG tablet Take 325 mg by mouth every other day. 05/08/23   [provider]  fluticasone (FLONASE) 50 MCG/ACT nasal spray SHAKE LIQUID AND USE  2 SPRAYS IN [facility] NOSTRIL DAILY 03/18/23   Nyoka Cowden, MD  guaiFENesin (MUCINEX) 600 MG 12 hr tablet Take 1 tablet (600 mg total) by mouth 2 (two) times daily. 02/07/19   Gold, Wayne E, PA-C  ipratropium (ATROVENT) 0.06 % nasal spray Place 2 sprays into both nostrils 4 (four) times daily. 08/16/23   Cobb, Ruby Cola, NP  losartan (COZAAR) 25 MG tablet Take 25 mg by mouth daily.    [provider]  predniSONE (DELTASONE) 10 MG tablet 4 tabs for 2 days, then 3 tabs for 2 days, 2 tabs for 2 days, then 1 tab for 2 days, then stop 09/13/23   Cobb, Ruby Cola, NP  sertraline (ZOLOFT) 100 MG tablet Take 200 mg by mouth daily.    [provider]  valACYclovir (VALTREX) 1000 MG tablet Take 500 mg by mouth as needed.    [provider]    Physical Exam: BP 118/73   Pulse 75   Temp 97.9 F (36.6 C) (Oral)   Resp 14   Ht 5\' 4"  (1.626 m)   Wt 52.6 kg   LMP  (LMP Unknown)   SpO2 94%   BMI 19.91 kg/m   General: 83 y.o. year-old female well developed well nourished in no  acute distress.  Alert and oriented x3. Cardiovascular: Regular rate and rhythm with no rubs or gallops.  No thyromegaly or JVD noted.  No lower extremity edema bilaterally. Respiratory: Diffuse rales bilaterally.  Poor inspiratory effort. Abdomen: Soft nontender nondistended with normal bowel sounds x4 quadrants. Muskuloskeletal: No cyanosis, clubbing or edema noted bilaterally Neuro: CN II-XII intact, strength, sensation, reflexes Skin: No ulcerative lesions noted or rashes Psychiatry: Judgement and insight appear normal. Mood is appropriate for condition and setting          Labs on Admission:  Basic Metabolic Panel: Recent Labs  Lab 09/09/23 1659 09/13/23 1534 09/13/23 1546  NA 136 130* 131*  131*  K 4.0 3.4* 3.5  3.5  CL 99 96* 98  CO2 22 23  --   GLUCOSE 116* 137* 137*  BUN 22 14 13   CREATININE 1.23* 0.93 0.80  CALCIUM 9.0 9.2  --    Liver Function Tests: Recent Labs  Lab 09/09/23 1659 09/13/23 1534  AST 30 25  ALT 30 28  ALKPHOS 43* 43  BILITOT 0.2 1.1  PROT 5.8* 6.5  ALBUMIN 4.2 3.2*   Recent Labs  Lab 09/13/23 1534  LIPASE 26   No results for input(s): "AMMONIA" in the last 168 hours. CBC: Recent Labs  Lab 09/09/23 1659 09/13/23 1534 09/13/23 1546  WBC 4.4 9.1  --   NEUTROABS  --  8.4*  --   HGB 10.7* 9.9* 9.9*  10.2*  HCT 31.8* 29.3* 29.0*  30.0*  MCV 97 94.5  --   PLT 144* 277  --    Cardiac Enzymes: No results for input(s): "CKTOTAL", "CKMB", "CKMBINDEX", "TROPONINI" in the last 168 hours.  BNP (last 3 results) Recent Labs    09/13/23 1534  BNP 905.0*    ProBNP (last 3 results) Recent Labs    08/16/23 1643  PROBNP 473.0*    CBG: No results for input(s): "GLUCAP" in the last 168 hours.  Radiological Exams on Admission: CT Angio Chest PE W and/or Wo Contrast Result Date: 09/13/2023 CLINICAL DATA:  Pulmonary embolism suspected, high probability. EXAM: CT ANGIOGRAPHY CHEST WITH CONTRAST TECHNIQUE: Multidetector CT imaging of  the chest was performed using the standard protocol during bolus  administration of intravenous contrast. Multiplanar CT image reconstructions and MIPs were obtained to evaluate the vascular anatomy. RADIATION DOSE REDUCTION: This exam was performed according to the departmental dose-optimization program which includes automated exposure control, adjustment of the mA and/or kV according to patient size and/or use of iterative reconstruction technique. CONTRAST:  75mL OMNIPAQUE IOHEXOL 350 MG/ML SOLN COMPARISON:  11/04/2022. FINDINGS: Cardiovascular: The heart is enlarged and there is no pericardial effusion. Three-vessel coronary artery calcifications are noted. There is atherosclerotic calcification of the aorta with aneurysmal dilatation of the ascending aorta measuring 4.5 cm. The pulmonary trunk is distended measuring 4.3 cm. No evidence of pulmonary embolism is seen. Mediastinum/Nodes: No enlarged mediastinal, hilar, or axillary lymph nodes. Thyroid gland, trachea, and esophagus demonstrate no significant findings. Lungs/Pleura: Paraseptal and centrilobular emphysematous changes are present in the lungs. Biapical pleural scarring is noted. There is bronchiectasis bilaterally with consolidation in the lingular segment of the left upper lobe and the medial aspect of the left lower lobe. Fibrotic changes are noted in the anterior aspect of the right upper lobe. No effusion or pneumothorax is seen. A few scattered pulmonary nodules are noted in the right upper lobe measuring up to 3 mm, not significantly changed from the prior exam. No new nodule is seen. Upper Abdomen: Stones are present within the gallbladder. No acute abnormality. Musculoskeletal: Sternotomy wires are noted. Degenerative changes are present in the thoracic spine. Review of the MIP images confirms the above findings. IMPRESSION: 1. No evidence of pulmonary embolism. 2. Focal consolidation in the left upper and lower lobes, possible atelectasis or  infiltrate. 3. Emphysema. 4. Cardiomegaly with coronary artery calcifications. 5. Distended pulmonary trunk which may be associated with pulmonary artery hypertension. 6. Aortic atherosclerosis with aneurysmal dilatation of the ascending aorta measuring 4.5 cm. Ascending thoracic aortic aneurysm. Recommend semi-annual imaging followup by CTA or MRA and referral to cardiothoracic surgery if not already obtained. This recommendation follows 2010 ACCF/AHA/AATS/ACR/ASA/SCA/SCAI/SIR/STS/SVM Guidelines for the Diagnosis and Management of Patients With Thoracic Aortic Disease. Circulation. 2010; 121: E454-U981. Aortic aneurysm NOS (ICD10-I71.9) Electronically Signed   By: Thornell Sartorius M.D.   On: 09/13/2023 21:12   DG Chest Port 1 View Result Date: 09/13/2023 CLINICAL DATA:  Possible sepsis EXAM: PORTABLE CHEST 1 VIEW COMPARISON:  08/30/2023, 08/16/2023, CT chest 11/04/2022 FINDINGS: Emphysema. Increased interstitial opacities and patchy peripheral left lower lung opacity compared to prior. Sternotomy and valve prosthesis. Stable cardiomediastinal silhouette with aortic atherosclerosis. No pneumothorax IMPRESSION: Emphysema. Increased interstitial opacities and patchy peripheral left lower lung opacity compared to prior, possible pneumonia. Electronically Signed   By: Jasmine Pang M.D.   On: 09/13/2023 18:06    EKG: I independently viewed the EKG done and my findings are as followed: Sinus tachycardia rate of 102.  Nonspecific EKG WBC 484.  Assessment/Plan Present on Admission:  Acute on chronic hypoxic respiratory failure (HCC)  Principal Problem:   Acute on chronic hypoxic respiratory failure (HCC)  Acute on chronic hypoxic respiratory failure secondary to influenza A viral infection, with concern for superimposed bacterial pulmonary infection, and COPD exacerbation. At baseline on 4 L nasal cannula Initially requiring 6 L to maintain O2 saturation above 90% Wean off O2 supplementation as  tolerated. Incentive spirometer. Continue Tamiflu Cefepime and IV vancomycin were administered in the ER. Will obtain MRSA screening test and continue cefepime for now. Resume IV vancomycin if MRSA is positive.  Influenza A viral infection, POA Start Tamiflu 30 mg twice daily x 5 days. As needed bronchodilators As needed antitussives  COPD with acute exacerbation in the setting of influenza A viral infection Received IV Solu-Medrol in the ER, continue Continue to treat underlying conditions Continue to maintain O2 saturation above 90%  Elevated troponin, suspect demand ischemia in the setting of severe hypoxia High-sensitivity troponin peaked at 55 and downtrending No reported anginal symptoms No evidence of acute ischemia on twelve-lead EKG Monitor on telemetry  Nonischemic cardiomyopathy LVEF 30 to 35% Chronic combined diastolic and systolic CHF Euvolemic on exam, despite BNP greater than 900 Monitor strict I's and O's and daily weight  Paroxysmal A-fib on Eliquis Rate controlled Resume home Eliquis Resume home amiodarone Monitor on telemetry  Mild hypovolemic hyponatremia Serum sodium 131 Increase in protein calorie intake Repeat BMP in the morning  Hyperlipidemia Resume home regimen  Chronic anxiety/depression Resume home Zoloft  Iron deficiency anemia Resume home ferrous sulfate  Generalized weakness PT OT assessment Fall precautions.   Critical care time: 65 minutes.   DVT prophylaxis: Home Eliquis.  Code Status: Full code.  Family Communication: None at bedside.  Disposition Plan: Admitted to telemetry cardiac unit.  Consults called: None.  Admission status: Inpatient status.   Status is: Inpatient The patient requires at least 2 midnights for further evaluation and treatment of present condition.   Darlin Drop MD [facility] Pager [phone]  If 7PM-7AM, please contact night-coverage [email or web address] Password  [facility]  09/13/2023, 9:47 PM

## 2023-09-13 NOTE — Telephone Encounter (Signed)
 Called patient.  Gave all information.  Patient states she is currently at [facility] ED.  She feels like she needs emergency treatment now.  Informed patient that Florentina Addison called in doxycycline and prednisone taper to [facility] pharmacy on [address]   Patient verbalized understanding.  Patient stated the ED physician was about to come into the exam room to see her.  Informed her I would let Florentina Addison know she is at the ED now.

## 2023-09-13 NOTE — Telephone Encounter (Signed)
 Patient states having symptoms of cough and mucus. Triage nurse is not available. Pharmacy is [facility] [address] Patient phone number is [phone].

## 2023-09-13 NOTE — Telephone Encounter (Addendum)
 Called patient.  Patient c/o deep cough, SOB and very congested x 1 week.  No fever.  Patient is using her oxgyen at 4 L continuously. SaO2 is 93% on 4L.  Admitted to hospital 08/30/23 to 09/02/23.  Admitting provider did not tell patient she had flu or pneumonia but patient does not think she had a cxr while admitted.  Worried she is getting pneumonia.  Patient is using Breztri 2 puffs BID and albuterol prn.  Please advise.

## 2023-09-14 ENCOUNTER — Encounter (HOSPITAL_COMMUNITY)
Admission: RE | Admit: 2023-09-14 | Discharge: 2023-09-14 | Disposition: A | Discharge status: Home / Self Care | Payer: Medicare Other | Source: Ambulatory Visit | Attending: Internal Medicine | Admitting: Internal Medicine

## 2023-09-14 DIAGNOSIS — I110 Hypertensive heart disease with heart failure: Secondary | ICD-10-CM | POA: Diagnosis not present

## 2023-09-14 DIAGNOSIS — I480 Paroxysmal atrial fibrillation: Secondary | ICD-10-CM | POA: Diagnosis not present

## 2023-09-14 DIAGNOSIS — I2510 Atherosclerotic heart disease of native coronary artery without angina pectoris: Secondary | ICD-10-CM | POA: Diagnosis not present

## 2023-09-14 DIAGNOSIS — J9621 Acute and chronic respiratory failure with hypoxia: Secondary | ICD-10-CM | POA: Diagnosis not present

## 2023-09-14 DIAGNOSIS — I5042 Chronic combined systolic (congestive) and diastolic (congestive) heart failure: Secondary | ICD-10-CM | POA: Diagnosis not present

## 2023-09-14 LAB — CBC
HCT: 24.6 % — ABNORMAL LOW (ref 36.0–46.0)
Hemoglobin: 8.5 g/dL — ABNORMAL LOW (ref 12.0–15.0)
MCH: 32.3 pg (ref 26.0–34.0)
MCHC: 34.6 g/dL (ref 30.0–36.0)
MCV: 93.5 fL (ref 80.0–100.0)
Platelets: 290 10*3/uL (ref 150–400)
RBC: 2.63 MIL/uL — ABNORMAL LOW (ref 3.87–5.11)
RDW: 14.7 % (ref 11.5–15.5)
WBC: 7.8 10*3/uL (ref 4.0–10.5)
nRBC: 0 % (ref 0.0–0.2)

## 2023-09-14 LAB — BASIC METABOLIC PANEL
Anion gap: 10 (ref 5–15)
BUN: 18 mg/dL (ref 8–23)
CO2: 25 mmol/L (ref 22–32)
Calcium: 8.6 mg/dL — ABNORMAL LOW (ref 8.9–10.3)
Chloride: 100 mmol/L (ref 98–111)
Creatinine, Ser: 1 mg/dL (ref 0.44–1.00)
GFR, Estimated: 57 mL/min — ABNORMAL LOW (ref >60)
Glucose, Bld: 132 mg/dL — ABNORMAL HIGH (ref 70–99)
Potassium: 3.5 mmol/L (ref 3.5–5.1)
Sodium: 135 mmol/L (ref 135–145)

## 2023-09-14 LAB — PHOSPHORUS: Phosphorus: 2.9 mg/dL (ref 2.5–4.6)

## 2023-09-14 LAB — PROCALCITONIN: Procalcitonin: 0.33 ng/mL

## 2023-09-14 LAB — MAGNESIUM: Magnesium: 1.7 mg/dL (ref 1.7–2.4)

## 2023-09-14 MED ORDER — IPRATROPIUM-ALBUTEROL 0.5-2.5 (3) MG/3ML IN SOLN
3.0000 mL | Freq: Two times a day (BID) | RESPIRATORY_TRACT | Status: DC
  Administered 2023-09-14 – 2023-09-16 (×4): 3 mL via RESPIRATORY_TRACT
  Filled 2023-09-14 (×4): qty 3

## 2023-09-14 MED ORDER — SERTRALINE HCL 100 MG PO TABS
200.0000 mg | ORAL_TABLET | Freq: Every day | ORAL | Status: DC
  Administered 2023-09-14 – 2023-09-16 (×3): 200 mg via ORAL
  Filled 2023-09-14 (×3): qty 2

## 2023-09-14 MED ORDER — BUDESON-GLYCOPYRROL-FORMOTEROL 160-9-4.8 MCG/ACT IN AERO: 2.0000 | INHALATION_SPRAY | Freq: Two times a day (BID) | RESPIRATORY_TRACT | Status: DC

## 2023-09-14 MED ORDER — APIXABAN 2.5 MG PO TABS
2.5000 mg | ORAL_TABLET | Freq: Two times a day (BID) | ORAL | Status: DC
Start: 2023-09-14 — End: 2023-09-16
  Administered 2023-09-14 – 2023-09-16 (×5): 2.5 mg via ORAL
  Filled 2023-09-14 (×5): qty 1

## 2023-09-14 MED ORDER — ATORVASTATIN CALCIUM 10 MG PO TABS
20.0000 mg | ORAL_TABLET | Freq: Every day | ORAL | Status: DC
  Administered 2023-09-14 – 2023-09-16 (×3): 20 mg via ORAL
  Filled 2023-09-14 (×3): qty 2

## 2023-09-14 MED ORDER — BUDESONIDE 0.25 MG/2ML IN SUSP
0.2500 mg | Freq: Two times a day (BID) | RESPIRATORY_TRACT | Status: DC
  Administered 2023-09-14 – 2023-09-16 (×4): 0.25 mg via RESPIRATORY_TRACT
  Filled 2023-09-14 (×4): qty 2

## 2023-09-14 MED ORDER — FLUTICASONE FUROATE-VILANTEROL 200-25 MCG/ACT IN AEPB
1.0000 | INHALATION_SPRAY | Freq: Every day | RESPIRATORY_TRACT | Status: DC
  Filled 2023-09-14: qty 28

## 2023-09-14 MED ORDER — UMECLIDINIUM BROMIDE 62.5 MCG/ACT IN AEPB
1.0000 | INHALATION_SPRAY | Freq: Every day | RESPIRATORY_TRACT | Status: DC
  Filled 2023-09-14: qty 7

## 2023-09-14 MED ORDER — BUDESONIDE 0.25 MG/2ML IN SUSP: 0.2500 mg | Freq: Two times a day (BID) | RESPIRATORY_TRACT | Status: DC

## 2023-09-14 MED ORDER — VITAMIN D 25 MCG (1000 UNIT) PO TABS
1000.0000 [IU] | ORAL_TABLET | Freq: Every day | ORAL | Status: DC
  Administered 2023-09-14 – 2023-09-16 (×3): 1000 [IU] via ORAL
  Filled 2023-09-14 (×3): qty 1

## 2023-09-14 MED ORDER — FERROUS SULFATE 325 (65 FE) MG PO TABS
325.0000 mg | ORAL_TABLET | ORAL | Status: DC
  Administered 2023-09-14 – 2023-09-16 (×2): 325 mg via ORAL
  Filled 2023-09-14 (×2): qty 1

## 2023-09-14 MED ORDER — REVEFENACIN 175 MCG/3ML IN SOLN
175.0000 ug | Freq: Every day | RESPIRATORY_TRACT | Status: DC
  Administered 2023-09-15 – 2023-09-16 (×2): 175 ug via RESPIRATORY_TRACT
  Filled 2023-09-14 (×2): qty 3

## 2023-09-14 MED ORDER — EZETIMIBE 10 MG PO TABS
10.0000 mg | ORAL_TABLET | Freq: Every day | ORAL | Status: DC
Start: 2023-09-14 — End: 2023-09-16
  Administered 2023-09-14 – 2023-09-16 (×3): 10 mg via ORAL
  Filled 2023-09-14 (×3): qty 1

## 2023-09-14 MED ORDER — ARFORMOTEROL TARTRATE 15 MCG/2ML IN NEBU
15.0000 ug | INHALATION_SOLUTION | Freq: Two times a day (BID) | RESPIRATORY_TRACT | Status: DC
  Administered 2023-09-14 – 2023-09-16 (×4): 15 ug via RESPIRATORY_TRACT
  Filled 2023-09-14 (×5): qty 2

## 2023-09-14 MED ORDER — GUAIFENESIN ER 600 MG PO TB12
600.0000 mg | ORAL_TABLET | Freq: Two times a day (BID) | ORAL | Status: DC
  Administered 2023-09-14 – 2023-09-16 (×5): 600 mg via ORAL
  Filled 2023-09-14 (×5): qty 1

## 2023-09-14 MED ORDER — FLUTICASONE PROPIONATE 50 MCG/ACT NA SUSP
2.0000 | Freq: Every day | NASAL | Status: DC
  Administered 2023-09-14 – 2023-09-16 (×3): 2 via NASAL
  Filled 2023-09-14: qty 16

## 2023-09-14 NOTE — Progress Notes (Signed)
 PROGRESS NOTE Dana Pruitt  ZOX:[id number] DOB: Jan 12, 1941 DOA: 09/13/2023 PCP: Merri Brunette, MD  Brief Narrative/Hospital Course:  83 y.o. female with medical history significant for nonischemic cardiomyopathy EF 30 to 35%, chronic combined diastolic and systolic CHF, paroxysmal A-fib on Eliquis, ascending aortic aneurysm 4.5 cm, CVA in 2020, COPD on 4 L nasal cannula, hypertension, hyperlipidemia, history of breast cancer status post chemotherapy, radiation, lumpectomy in 2013, recently prescribed Macrobid 100 mg twice daily x 5 days by PCP (09/10/2023) for UTI, recently admitted by cardiology on 08/30/2023 and discharged on 09/02/2023 for chest pain for which she had LHC/RHC (08/31/23), who presents to the ER from home with complaints of shortness of breath and a productive cough with greenish sputum , in the ED workup with CT angio further labs-revealed influenza A with left upper and lower lobe consolidation possible atelectasis or infiltrates, emphysema, admitted oxygen 4 L saturating 93% admitted for acute on chronic hypoxic respiratory failure, pneumonia influenza and COPD exacerbation and elevated troponin flat due to demand ischemia.     Subjective:  Seen and examined Feeling better this am no CP/N/V or abd pain C/o SOB worse form bsaeline Overnight afebrile BP stable on 3 L [place].  On admission 5=6 L.  Labs with mild hyponatremia potassium stable and renal function okay hemoglobin 10.   Assessment and Plan:  Acute on chronic hypoxic respiratory failure Influenza A infection Superimposed bacterial pneumonia Acute COPD exacerbation: At baseline on 4 L [place] initially on 6 L in the ED. CT angiogram PE but other findings as above.  Magnesium 4 influenza A Tamiflu, COPD exacerbation with bronchodilators> change continue Yupelri/Brovana/Pulmicort. Cont Solu-Medrol and antibiotics coverage for pneumonia.  MRSA screen pending  Continue incentive spirometry, pulmonary hygiene, PT OT  ambulation  Elevated troponin: NICM Chronic combined systolic and diastolic CHF with EF 30 to 35%: Suspect demand ischemia due to hypoxia, troponin flat in 50s no anginal symptoms.  No evidence of ischemia on the EKG on admission Of note she had cardiac cath on 08/31/2023 with patent coronary arteries no significant disease. BNP elevated but euvolemic on exam on admission.  Monitor volume status  Recent UTI: Was placed on antibiotics last Friday. UA is normal.  PAF: Rate controlled continue home Eliquis and home amiodarone and monitor on telemetry  Mild hyponatremia: Will monitor  HLD: Continue Lipitor.  Chronic anemia/IDA: Chronic.  Continue continue iron supplement  Chronic anxiety/depression continue Zoloft  Generalized weakness/debility deconditioning: PT OT consulted continue palpitation  DVT prophylaxis: apixaban (ELIQUIS) tablet 2.5 mg Start: 09/14/23 0400 Code Status:   Code Status: Full Code Family Communication: plan of care discussed with patient at bedside. Patient status is: Remains hospitalized because of severity of illness Level of care: Telemetry Cardiac   Dispo: The patient is from: home w/ husband and independent and drives at baseline.            Anticipated disposition: TBD  Objective: Vitals last 24 hrs: Vitals:   09/14/23 0100 09/14/23 0435 09/14/23 0744 09/14/23 0810  BP:  (!) 154/86    Pulse:    72  Resp: 20 20 20 20   Temp: 97.8 F (36.6 C) 97.7 F (36.5 C) 97.8 F (36.6 C)   TempSrc: Oral Oral Oral   SpO2:  93%  95%  Weight:  52.8 kg    Height:       Weight change:   Physical Examination: General exam: alert awake, older than stated age HEENT:Oral mucosa moist, Ear/Nose WNL grossly Respiratory system: Bilaterally diminished  BS, no wheezing, o use of accessory muscle Cardiovascular system: S1 & S2 +. Gastrointestinal system: Abdomen soft, NT,ND,BS+ Nervous System: Alert, awake,following commands. Extremities: LE edema neg, moving  arms, warm legs Skin: No rashes,warm. MSK: Normal muscle bulk/tone.   Medications reviewed:  Scheduled Meds:  amiodarone  200 mg Oral Daily   apixaban  2.5 mg Oral BID   arformoterol  15 mcg Nebulization BID   atorvastatin  20 mg Oral Daily   budesonide (PULMICORT) nebulizer solution  0.25 mg Nebulization BID   cholecalciferol  1,000 Units Oral Daily   ezetimibe  10 mg Oral Daily   ferrous sulfate  325 mg Oral QODAY   guaiFENesin  600 mg Oral BID   ipratropium-albuterol  3 mL Nebulization BID   methylPREDNISolone (SOLU-MEDROL) injection  40 mg Intravenous Daily   oseltamivir  30 mg Oral BID   revefenacin  175 mcg Nebulization Daily   sertraline  200 mg Oral Daily   Continuous Infusions:  ceFEPime (MAXIPIME) IV Stopped (09/14/23 0008)      Diet Order             Diet Heart Room service appropriate? Yes; Fluid consistency: Thin  Diet effective now                  Intake/Output Summary (Last 24 hours) at 09/14/2023 0931 Last data filed at 09/14/2023 0441 Gross per 24 hour  Intake --  Output 1350 ml  Net -1350 ml   Net IO Since Admission: -1,350 mL [09/14/23 0931]  Wt Readings from Last 3 Encounters:  09/14/23 52.8 kg  09/09/23 53.7 kg  09/02/23 53.8 kg     Unresulted Labs (From admission, onward)     Start     Ordered   09/14/23 0500  Procalcitonin  Tomorrow morning,   R       References:    Procalcitonin Lower Respiratory Tract Infection AND Sepsis Procalcitonin Algorithm   09/14/23 0346   09/13/23 2239  MRSA Next Gen by PCR, Nasal  Once,   R        09/13/23 2238   09/13/23 1501  Blood Culture (routine x 2)  (Septic presentation on arrival (screening labs, nursing and treatment orders for obvious sepsis))  BLOOD CULTURE X 2,   STAT      09/13/23 1501          Data Reviewed: I have personally reviewed following labs and imaging studies ( see epic result tab) CBC: Recent Labs  Lab 09/09/23 1659 09/13/23 1534 09/13/23 1546 09/14/23 0817  WBC 4.4 9.1   --  7.8  NEUTROABS  --  8.4*  --   --   HGB 10.7* 9.9* 9.9*  10.2* 8.5*  HCT 31.8* 29.3* 29.0*  30.0* 24.6*  MCV 97 94.5  --  93.5  PLT 144* 277  --  290   CMP: Recent Labs  Lab 09/09/23 1659 09/13/23 1534 09/13/23 1546 09/14/23 0817  NA 136 130* 131*  131* 135  K 4.0 3.4* 3.5  3.5 3.5  CL 99 96* 98 100  CO2 22 23  --  25  GLUCOSE 116* 137* 137* 132*  BUN 22 14 13 18   CREATININE 1.23* 0.93 0.80 1.00  CALCIUM 9.0 9.2  --  8.6*  MG  --   --   --  1.7  PHOS  --   --   --  2.9   GFR: Estimated Creatinine Clearance: 36.8 mL/min (by C-G formula based on SCr  of 1 mg/dL). Recent Labs  Lab 09/09/23 1659 09/13/23 1534  AST 30 25  ALT 30 28  ALKPHOS 43* 43  BILITOT 0.2 1.1  PROT 5.8* 6.5  ALBUMIN 4.2 3.2*   Recent Labs  Lab 09/13/23 1534  LIPASE 26   No results for input(s): "AMMONIA" in the last 168 hours. Coagulation Profile:  Recent Labs  Lab 09/13/23 1534  INR 1.4*   No results for input(s): "PROBNP" in the last 168 hours.  No results for input(s): "HGBA1C" in the last 72 hours. No results for input(s): "GLUCAP" in the last 168 hours. No results for input(s): "CHOL", "HDL", "LDLCALC", "TRIG", "CHOLHDL", "LDLDIRECT" in the last 72 hours. No results for input(s): "TSH", "T4TOTAL", "FREET4", "T3FREE", "THYROIDAB" in the last 72 hours. Sepsis Labs: Recent Labs  Lab 09/13/23 1547  LATICACIDVEN 1.2   Recent Results (from the past 240 hours)  Resp panel by RT-PCR (RSV, Flu A&B, Covid) Anterior Nasal Swab     Status: Abnormal   Collection Time: 09/13/23  3:01 PM   Specimen: Anterior Nasal Swab  Result Value Ref Range Status   SARS Coronavirus 2 by RT PCR NEGATIVE NEGATIVE Final   Influenza A by PCR POSITIVE (A) NEGATIVE Final   Influenza B by PCR NEGATIVE NEGATIVE Final    Comment: (NOTE) The Xpert Xpress SARS-CoV-2/FLU/RSV plus assay is intended as an aid in the diagnosis of influenza from Nasopharyngeal swab specimens and should not be used as a sole basis  for treatment. Nasal washings and aspirates are unacceptable for Xpert Xpress SARS-CoV-2/FLU/RSV testing.  Fact Sheet for Patients: BloggerCourse.com  Fact Sheet for Healthcare Providers: SeriousBroker.it  This test is not yet approved or cleared by the [place] FDA and has been authorized for detection and/or diagnosis of SARS-CoV-2 by FDA under an Emergency Use Authorization (EUA). This EUA will remain in effect (meaning this test can be used) for the duration of the COVID-19 declaration under Section 564(b)(1) of the Act, 21 U.S.C. section 360bbb-3(b)(1), unless the authorization is terminated or revoked.     Resp Syncytial Virus by PCR NEGATIVE NEGATIVE Final    Comment: (NOTE) Fact Sheet for Patients: BloggerCourse.com  Fact Sheet for Healthcare Providers: SeriousBroker.it  This test is not yet approved or cleared by the [place] FDA and has been authorized for detection and/or diagnosis of SARS-CoV-2 by FDA under an Emergency Use Authorization (EUA). This EUA will remain in effect (meaning this test can be used) for the duration of the COVID-19 declaration under Section 564(b)(1) of the Act, 21 U.S.C. section 360bbb-3(b)(1), unless the authorization is terminated or revoked.  Performed at [facility], [address] [place], [place] [address]    Antimicrobials/Microbiology: Anti-infectives (From admission, onward)    Start     Dose/Rate Route Frequency Ordered Stop   09/14/23 1000  oseltamivir (TAMIFLU) capsule 30 mg       Placed in "Followed by" Linked Group   30 mg Oral 2 times daily 09/13/23 2149 09/18/23 2159   09/14/23 0800  cefTRIAXone (ROCEPHIN) 1 g in sodium chloride 0.9 % 100 mL IVPB  Status:  Discontinued        1 g 200 mL/hr over 30 Minutes Intravenous Every 24 hours 09/13/23 2152 09/13/23 2237   09/13/23 2330  oseltamivir (TAMIFLU)  capsule 75 mg       Placed in "Followed by" Linked Group   75 mg Oral  Once 09/13/23 2149 09/14/23 0013   09/13/23 2245  ceFEPIme (MAXIPIME) 2  g in sodium chloride 0.9 % 100 mL IVPB        2 g 200 mL/hr over 30 Minutes Intravenous Every 12 hours 09/13/23 2237     09/13/23 2200  oseltamivir (TAMIFLU) capsule 75 mg  Status:  Discontinued        75 mg Oral 2 times daily 09/13/23 2147 09/13/23 2149   09/13/23 2200  doxycycline (VIBRAMYCIN) 100 mg in sodium chloride 0.9 % 250 mL IVPB  Status:  Discontinued        100 mg 125 mL/hr over 120 Minutes Intravenous Every 12 hours 09/13/23 2152 09/13/23 2237   09/13/23 1515  ceFEPIme (MAXIPIME) 2 g in sodium chloride 0.9 % 100 mL IVPB        2 g 200 mL/hr over 30 Minutes Intravenous  Once 09/13/23 1501 09/13/23 1641   09/13/23 1515  metroNIDAZOLE (FLAGYL) IVPB 500 mg        500 mg 100 mL/hr over 60 Minutes Intravenous  Once 09/13/23 1501 09/13/23 1903   09/13/23 1515  vancomycin (VANCOCIN) IVPB 1000 mg/200 mL premix        1,000 mg 200 mL/hr over 60 Minutes Intravenous  Once 09/13/23 1501 09/13/23 1903         Component Value Date/Time   SDES SPUTUM 10/18/2012 1133   SDES SPUTUM 10/18/2012 1133   SPECREQUEST Normal 10/18/2012 1133   SPECREQUEST NONE 10/18/2012 1133   CULT NORMAL OROPHARYNGEAL FLORA 10/18/2012 1133   REPTSTATUS 10/18/2012 FINAL 10/18/2012 1133   REPTSTATUS 10/20/2012 FINAL 10/18/2012 1133     Radiology Studies: CT Angio Chest PE W and/or Wo Contrast Result Date: 09/13/2023 CLINICAL DATA:  Pulmonary embolism suspected, high probability. EXAM: CT ANGIOGRAPHY CHEST WITH CONTRAST TECHNIQUE: Multidetector CT imaging of the chest was performed using the standard protocol during bolus administration of intravenous contrast. Multiplanar CT image reconstructions and MIPs were obtained to evaluate the vascular anatomy. RADIATION DOSE REDUCTION: This exam was performed according to the departmental dose-optimization program which includes  automated exposure control, adjustment of the mA and/or kV according to patient size and/or use of iterative reconstruction technique. CONTRAST:  75mL OMNIPAQUE IOHEXOL 350 MG/ML SOLN COMPARISON:  11/04/2022. FINDINGS: Cardiovascular: The heart is enlarged and there is no pericardial effusion. Three-vessel coronary artery calcifications are noted. There is atherosclerotic calcification of the aorta with aneurysmal dilatation of the ascending aorta measuring 4.5 cm. The pulmonary trunk is distended measuring 4.3 cm. No evidence of pulmonary embolism is seen. Mediastinum/Nodes: No enlarged mediastinal, hilar, or axillary lymph nodes. Thyroid gland, trachea, and esophagus demonstrate no significant findings. Lungs/Pleura: Paraseptal and centrilobular emphysematous changes are present in the lungs. Biapical pleural scarring is noted. There is bronchiectasis bilaterally with consolidation in the lingular segment of the left upper lobe and the medial aspect of the left lower lobe. Fibrotic changes are noted in the anterior aspect of the right upper lobe. No effusion or pneumothorax is seen. A few scattered pulmonary nodules are noted in the right upper lobe measuring up to 3 mm, not significantly changed from the prior exam. No new nodule is seen. Upper Abdomen: Stones are present within the gallbladder. No acute abnormality. Musculoskeletal: Sternotomy wires are noted. Degenerative changes are present in the thoracic spine. Review of the MIP images confirms the above findings. IMPRESSION: 1. No evidence of pulmonary embolism. 2. Focal consolidation in the left upper and lower lobes, possible atelectasis or infiltrate. 3. Emphysema. 4. Cardiomegaly with coronary artery calcifications. 5. Distended pulmonary trunk which may be associated with  pulmonary artery hypertension. 6. Aortic atherosclerosis with aneurysmal dilatation of the ascending aorta measuring 4.5 cm. Ascending thoracic aortic aneurysm. Recommend semi-annual  imaging followup by CTA or MRA and referral to cardiothoracic surgery if not already obtained. This recommendation follows 2010 ACCF/AHA/AATS/ACR/ASA/SCA/SCAI/SIR/STS/SVM Guidelines for the Diagnosis and Management of Patients With Thoracic Aortic Disease. Circulation. 2010; 121: Z610-R604. Aortic aneurysm NOS (ICD10-I71.9) Electronically Signed   By: Thornell Sartorius M.D.   On: 09/13/2023 21:12   DG Chest Port 1 View Result Date: 09/13/2023 CLINICAL DATA:  Possible sepsis EXAM: PORTABLE CHEST 1 VIEW COMPARISON:  08/30/2023, 08/16/2023, CT chest 11/04/2022 FINDINGS: Emphysema. Increased interstitial opacities and patchy peripheral left lower lung opacity compared to prior. Sternotomy and valve prosthesis. Stable cardiomediastinal silhouette with aortic atherosclerosis. No pneumothorax IMPRESSION: Emphysema. Increased interstitial opacities and patchy peripheral left lower lung opacity compared to prior, possible pneumonia. Electronically Signed   By: Jasmine Pang M.D.   On: 09/13/2023 18:06     LOS: 1 day   Total time spent in review of labs and imaging, patient evaluation, formulation of plan, documentation and communication with patient/family: 35 minutes  Lanae Boast, MD [facility] 09/14/2023, 9:31 AM

## 2023-09-14 NOTE — Plan of Care (Signed)

## 2023-09-14 NOTE — TOC Initial Note (Addendum)
 Transition of Care [facility]) - Initial/Assessment Note    Patient Details  Name: Dana Pruitt MRN: [id number] Date of Birth: Jul 03, 1940  Transition of Care [facility]) CM/SW Contact:    Gala Lewandowsky, RN Phone Number: 09/14/2023, 1:10 PM  Clinical Narrative:  Risk for readmission assessment completed. PTA patient was from home with spouse. Patient is currently active with [facility]- PT/OT-adding RN since patient has a risk for readmission-RN will be for disease management. Patient will need home health orders and face to face. [facility] is aware that the patient is hospitalized. Patient reports that she uses oxygen in the home 4 liters from [employer]-Case Manager is confirming with [employer]. Case Manager will continue to follow for additional transition of care needs.   1319 09-14-23 Per [employer] oxygen was picked up from the home in 2021. Reaching out to [employer] to confirm oxygen.   [employer] confirmed that the patient is not active with oxygen.                Expected Discharge Plan: Home w Home Health Services Barriers to Discharge: Continued Medical Work up   Patient Goals and CMS Choice Patient states their goals for this hospitalization and ongoing recovery are:: plan to return home.  Expected Discharge Plan and Services   Discharge Planning Services: CM Consult Post Acute Care Choice: Home Health, Resumption of Svcs/PTA Provider Living arrangements for the past 2 months: Single Family Home                   DME Agency: NA       HH Arranged: PT, RN, OT HH Agency: [facility] Date HH Agency Contacted: 09/14/23 Time HH Agency Contacted: 1302 Representative spoke with at [facility] Agency: Haywood Lasso  Prior Living Arrangements/Services Living arrangements for the past 2 months: Single Family Home Lives with:: Spouse Patient language and need for interpreter reviewed:: Yes Do you feel safe going back to the place where you live?: Yes      Need for Family Participation in  Patient Care: No (Comment) Care giver support system in place?: No (comment) Current home services: DME (Oxygen at 4 Liters.) Criminal Activity/Legal Involvement Pertinent to Current Situation/Hospitalization: No - Comment as needed  Activities of Daily Living   ADL Screening (condition at time of admission) Independently performs ADLs?: No Does the patient have a NEW difficulty with bathing/dressing/toileting/self-feeding that is expected to last >3 days?: Yes (Initiates electronic notice to provider for possible OT consult) Does the patient have a NEW difficulty with getting in/out of bed, walking, or climbing stairs that is expected to last >3 days?: Yes (Initiates electronic notice to provider for possible PT consult) Does the patient have a NEW difficulty with communication that is expected to last >3 days?: No Is the patient deaf or have difficulty hearing?: No Does the patient have difficulty seeing, even when wearing glasses/contacts?: No Does the patient have difficulty concentrating, remembering, or making decisions?: No  Permission Sought/Granted Permission sought to share information with : Family Supports, Magazine features editor, Case Estate manager/land agent granted to share information with : Yes, Verbal Permission Granted     Permission granted to share info w AGENCY: [facility]   Emotional Assessment Appearance:: Appears stated age Attitude/Demeanor/Rapport: Engaged Affect (typically observed): Appropriate Orientation: : Oriented to Self, Oriented to Place, Oriented to  Time, Oriented to Situation Alcohol / Substance Use: Not Applicable Psych Involvement: No (comment)  Admission diagnosis:  Weakness [R53.1] Influenza A [J10.1] COPD  exacerbation (HCC) [J44.1] Acute on chronic congestive heart failure, unspecified heart failure type (HCC) [I50.9] Sepsis with acute hypoxic respiratory failure without septic shock, due to unspecified organism (HCC) [A41.9,  R65.20, J96.01] Acute on chronic hypoxic respiratory failure (HCC) [J96.21] Patient Active Problem List   Diagnosis Date Noted   Acute on chronic hypoxic respiratory failure (HCC) 09/13/2023   Chest pain 08/30/2023   Non-ischemic cardiomyopathy (HCC)    Solitary pulmonary nodule on lung CT 01/05/2022   Allergic rhinitis 12/20/2021   Thoracic aortic aneurysm (HCC) 08/26/2021   Neck muscle spasm 08/30/2019   S/P AVR (aortic valve replacement) 04/26/2019   Essential hypertension 04/19/2019   Chronic respiratory failure with hypoxia (HCC) 04/19/2019   PAF (paroxysmal atrial fibrillation) (HCC) 02/09/2019   Aortic valve disease 01/31/2019   Pulmonary hypertension (HCC)    Chronic systolic CHF (congestive heart failure) (HCC) 12/16/2018   Severe aortic regurgitation 09/05/2014   HLD (hyperlipidemia) 03/28/2014   Hollenhorst plaque, left eye 02/15/2014   Osteopenia 11/16/2013   COPD  GOLD II 11/22/2012   Depression 10/17/2012   Primary cancer of upper outer quadrant of right female breast (HCC) 02/27/2010   PCP:  Merri Brunette, MD Pharmacy:   [id number] [place], [place] - [address] AT [facility] OF [address] & [address] [place] [address] Phone: [phone] Fax: [phone]  [facility] [address] [place] [address] Phone: [phone] Fax: [phone]  Social Drivers of Health (SDOH) Social History: SDOH Screenings   Food Insecurity: No Food Insecurity (09/14/2023)  Housing: Low Risk  (09/14/2023)  Transportation Needs: No Transportation Needs (09/14/2023)  Utilities: Not At Risk (09/14/2023)  Depression (PHQ2-9): Medium Risk (07/26/2023)  Social Connections: Moderately Integrated (09/14/2023)  Tobacco Use: Medium Risk (09/13/2023)   Readmission Risk Interventions    09/14/2023    1:01 PM  Readmission Risk Prevention Plan  Transportation Screening Complete  HRI or Home Care Consult  Complete  Social Work Consult for Recovery Care Planning/Counseling Complete  Palliative Care Screening Not Applicable  Medication Review Oceanographer) Referral to Pharmacy

## 2023-09-14 NOTE — Plan of Care (Signed)
 Alert and oriented x4. Dyspnea noted on exertion.  On baseline 3-4 LPM via Nasal Cannula. OOB to chair with PT/OT.  Requesting external female catheter for overnight use. Was removed during the day for increased mobility.   Problem: Education: Goal: Knowledge of General Education information will improve Description: Including pain rating scale, medication(s)/side effects and non-pharmacologic comfort measures Outcome: Progressing   Problem: Health Behavior/Discharge Planning: Goal: Ability to manage health-related needs will improve Outcome: Progressing   Problem: Clinical Measurements: Goal: Ability to maintain clinical measurements within normal limits will improve Outcome: Progressing Goal: Will remain free from infection Outcome: Progressing Goal: Diagnostic test results will improve Outcome: Progressing

## 2023-09-14 NOTE — Evaluation (Signed)
 Physical Therapy Evaluation Patient Details Name: Dana Pruitt MRN: [id number] DOB: 1941/02/20 Today's Date: 09/14/2023  History of Present Illness  Dana Pruitt is a 83 y.o. female with medical history significant for nonischemic cardiomyopathy EF 30 to 35%, chronic combined diastolic and systolic CHF, paroxysmal A-fib on Eliquis, ascending aortic aneurysm 4.5 cm, CVA in 2020, COPD on 4 L nasal cannula, hypertension, hyperlipidemia, history of breast cancer status post chemotherapy, radiation, lumpectomy in 2013, recently prescribed Macrobid 100 mg twice daily x 5 days by PCP (09/10/2023) for UTI, recently admitted by cardiology on 08/30/2023 and discharged on 09/02/2023 for chest pain for which she had LHC/RHC (08/31/23), who presents to the ER from home 09/13/23 with complaints of shortness of breath and a productive cough with greenish sputum .  Associated with subjective fevers and generalized weakness.  Symptoms started sometimes after her discharge from the hospital.  Presents to the ER for further evaluation. Found to be Flu +   Clinical Impression  Patient received in bed, she is eager to get up out of bed. Patient reports she is feeling better, however continues to have cough and mild SOB during mobility. O2 sats down to 89%. HR up to 130s with mobility. She is mod I with bed mobility. Transfers with cga and ambulated 30 feet in room with RW and cga/supervision. Assist needed for line management. She will continue to benefit from skilled PT to improve endurance, strength and safety with mobility.          If plan is discharge home, recommend the following: A little help with walking and/or transfers;A little help with bathing/dressing/bathroom;Assistance with cooking/housework;Assist for transportation;Help with stairs or ramp for entrance   Can travel by private vehicle    yes    Equipment Recommendations Rolling walker (2 wheels)  Recommendations for Other Services        Functional Status Assessment Patient has had a recent decline in their functional status and demonstrates the ability to make significant improvements in function in a reasonable and predictable amount of time.     Precautions / Restrictions Precautions Precautions: Fall Recall of Precautions/Restrictions: Intact Precaution/Restrictions Comments: 4L O2 baseline; watch BP (syncope and fall with nursing 3/12); s/p radial access heart cath 3/11 Restrictions Weight Bearing Restrictions Per Provider Order: No      Mobility  Bed Mobility Overal bed mobility: Independent                  Transfers Overall transfer level: Needs assistance Equipment used: Rolling walker (2 wheels) Transfers: Sit to/from Stand Sit to Stand: Contact guard assist                Ambulation/Gait Ambulation/Gait assistance: Contact guard assist Gait Distance (Feet): 30 Feet Assistive device: Rolling walker (2 wheels) Gait Pattern/deviations: Step-through pattern, Decreased step length - right, Decreased step length - left, Decreased stride length Gait velocity: decreased     General Gait Details: hesitant gait. Slow pace. Fearful of passing out like she did last admission  Stairs            Wheelchair Mobility     Tilt Bed    Modified Rankin (Stroke Patients Only)       Balance Overall balance assessment: Modified Independent Sitting-balance support: Feet supported Sitting balance-Leahy Scale: Normal     Standing balance support: Bilateral upper extremity supported, During functional activity, Reliant on assistive device for balance Standing balance-Leahy Scale: Good  Pertinent Vitals/Pain Pain Assessment Pain Assessment: No/denies pain    Home Living Family/patient expects to be discharged to:: (P) Private residence Living Arrangements: Spouse/significant other (she is caregiver for her spouse who is on walker) Available  Help at Discharge: Family;Available 24 hours/day Type of Home: House Home Access: Stairs to enter Entrance Stairs-Rails: Can reach both Entrance Stairs-Number of Steps: 2 Alternate Level Stairs-Number of Steps: 15 Home Layout: Two level;Able to live on main level with bedroom/bathroom;Bed/bath upstairs (having stair lift installed) Home Equipment: Grab bars - tub/shower;Grab bars - toilet;Shower seat - built Char[employer] fundraiser (2 wheels) Additional Comments: on 4L O2 baseline at all times    Prior Function Prior Level of Function : Independent/Modified Independent;Driving             Mobility Comments: no AD, no other falls other than the one during last admission due to syncope event ADLs Comments: Prepares meals, occasional help for cleaning     Extremity/Trunk Assessment   Upper Extremity Assessment Upper Extremity Assessment: Defer to OT evaluation    Lower Extremity Assessment Lower Extremity Assessment: Overall WFL for tasks assessed    Cervical / Trunk Assessment Cervical / Trunk Assessment: Normal  Communication   Communication Communication: No apparent difficulties    Cognition Arousal: Alert Behavior During Therapy: WFL for tasks assessed/performed   PT - Cognitive impairments: No apparent impairments                         Following commands: Intact       Cueing Cueing Techniques: Verbal cues     General Comments      Exercises     Assessment/Plan    PT Assessment Patient needs continued PT services  PT Problem List Decreased strength;Decreased activity tolerance;Decreased balance;Decreased mobility;Cardiopulmonary status limiting activity       PT Treatment Interventions DME instruction;Gait training;Stair training;Functional mobility training;Therapeutic activities;Therapeutic exercise;Balance training;Neuromuscular re-education;Patient/family education    PT Goals (Current goals can be found in the Care Plan section)  Acute  Rehab PT Goals Patient Stated Goal: return home, get some assistance PT Goal Formulation: With patient Time For Goal Achievement: 09/20/23 Potential to Achieve Goals: Good    Frequency Min 2X/week     Co-evaluation               AM-PAC PT "6 Clicks" Mobility  Outcome Measure Help needed turning from your back to your side while in a flat bed without using bedrails?: None Help needed moving from lying on your back to sitting on the side of a flat bed without using bedrails?: None Help needed moving to and from a bed to a chair (including a wheelchair)?: A Little Help needed standing up from a chair using your arms (e.g., wheelchair or bedside chair)?: A Little Help needed to walk in hospital room?: A Little Help needed climbing 3-5 steps with a railing? : A Lot 6 Click Score: 19    End of Session Equipment Utilized During Treatment: Oxygen Activity Tolerance: Patient limited by fatigue Patient left: in chair;with call bell/phone within reach;with chair alarm set Nurse Communication: Mobility status PT Visit Diagnosis: Muscle weakness (generalized) (M62.81);Difficulty in walking, not elsewhere classified (R26.2);History of falling (Z91.81)    Time: 1610-9604 PT Time Calculation (min) (ACUTE ONLY): 28 min   Charges:   PT Evaluation $PT Eval Moderate Complexity: 1 Mod PT Treatments $Gait Training: 8-22 mins PT General Charges $$ ACUTE PT VISIT: 1 Visit  [place], PT, [facility] 09/14/23,2:50 PM

## 2023-09-14 NOTE — Telephone Encounter (Signed)
Attempted to call patient, no answer, mailbox full unable to leave message.

## 2023-09-14 NOTE — Hospital Course (Addendum)
 83 y.o. female with medical history significant for nonischemic cardiomyopathy EF 30 to 35%, chronic combined diastolic and systolic CHF, paroxysmal A-fib on Eliquis, ascending aortic aneurysm 4.5 cm, CVA in 2020, COPD on 4 L nasal cannula, hypertension, hyperlipidemia, history of breast cancer status post chemotherapy, radiation, lumpectomy in 2013, recently prescribed Macrobid 100 mg twice daily x 5 days by PCP (09/10/2023) for UTI, recently admitted by cardiology on 08/30/2023 and discharged on 09/02/2023 for chest pain for which she had LHC/RHC (08/31/23), who presents to the ER from home with complaints of shortness of breath and a productive cough with greenish sputum , in the ED workup with CT angio further labs-revealed influenza A with left upper and lower lobe consolidation possible atelectasis or infiltrates, emphysema, admitted oxygen 4 L saturating 93% admitted for acute on chronic hypoxic respiratory failure, pneumonia influenza and COPD exacerbation and elevated troponin flat due to demand ischemia.  Subjective: Seen this am on bedside chair Feels little better but still coughing n Overnight afebrile BP stable doing well on home oxygen setting  Assessment and plan  Acute on chronic hypoxic respiratory failure Influenza A infection Superimposed bacterial pneumonia Sepsis POA Acute COPD exacerbation: At baseline on 4 L [place] initially on 6 L in the ED. CTA no PE-, but with focal consolidation L UL, LLL, emphysema, pulmonary artery hypertension. Continue Tamiflu, Yupelri/Brovana/Pulmicort, Solu-Medrol and antibiotics coverage for pneumonia. MRSA screen pending Continue ID, PTOT   Elevated troponin: NICM Chronic combined systolic and diastolic CHF with EF 30 to 35%: Suspect demand ischemia due to hypoxia, troponin flat in 50s no anginal symptoms. EKG- NO E/O ischemia Of note she had cardiac cath on 08/31/2023 with patent coronary arteries no significant disease. BNP elevated but euvolemic on  exam on admission.  Monitor volume status   Recent UTI: Was placed on antibiotics last Friday. UA is normal.   PAF: In NSR.  Continue home Eliquis and amiodarone    Mild hyponatremia: Resolved.     HLD: Continue Lipitor.   Chronic anemia/IDA: Hb stable 8-9 continue continue iron supplement Recent Labs  Lab 09/09/23 1659 09/13/23 1534 09/13/23 1546 09/14/23 0817  HGB 10.7* 9.9* 9.9*  10.2* 8.5*  HCT 31.8* 29.3* 29.0*  30.0* 24.6*     Chronic anxiety/depression Mood stable, continue Zoloft   Generalized weakness/debility deconditioning: PT OT to continue, advised home health PT

## 2023-09-14 NOTE — Evaluation (Signed)
 Occupational Therapy Evaluation Patient Details Name: Dana Pruitt MRN: [id number] DOB: 1940-11-24 Today's Date: 09/14/2023   History of Present Illness   Dana Pruitt is a 83 y.o. female with medical history significant for nonischemic cardiomyopathy EF 30 to 35%, chronic combined diastolic and systolic CHF, paroxysmal A-fib on Eliquis, ascending aortic aneurysm 4.5 cm, CVA in 2020, COPD on 4 L nasal cannula, hypertension, hyperlipidemia, history of breast cancer status post chemotherapy, radiation, lumpectomy in 2013, recently prescribed Macrobid 100 mg twice daily x 5 days by PCP (09/10/2023) for UTI, recently admitted by cardiology on 08/30/2023 and discharged on 09/02/2023 for chest pain for which she had LHC/RHC (08/31/23), who presents to the ER from home 09/13/23 with complaints of shortness of breath and a productive cough with greenish sputum .  Associated with subjective fevers and generalized weakness.  Symptoms started sometimes after her discharge from the hospital.  Presents to the ER for further evaluation. Found to be Flu +     Clinical Impressions Pt reports ind at baseline with ADLs and ind with mobility, lives with spouse whom she assists with IADLs. Pt currently needing CGA-mod A for ADLs, and CGA for transfers with RW. Pt with 2/4 DOE upon walking to bathroom, SpO2 in high 80s on 4L O2, incr to 90s with cues for PLB. Pt demo's IS use. Pt presenting with impairments listed below, will follow acutely. Recommend HHOT at d/c.      If plan is discharge home, recommend the following:   A little help with walking and/or transfers;A lot of help with bathing/dressing/bathroom;Assistance with cooking/housework;Assist for transportation;Help with stairs or ramp for entrance     Functional Status Assessment   Patient has had a recent decline in their functional status and demonstrates the ability to make significant improvements in function in a reasonable and predictable amount  of time.     Equipment Recommendations   None recommended by OT     Recommendations for Other Services   PT consult     Precautions/Restrictions   Precautions Precautions: Fall Recall of Precautions/Restrictions: Intact Precaution/Restrictions Comments: 4L O2 baseline; watch BP (syncope and fall with nursing 3/12); s/p radial access heart cath 3/11 Restrictions Weight Bearing Restrictions Per Provider Order: No     Mobility Bed Mobility               General bed mobility comments: in chair with PT upon arrival    Transfers Overall transfer level: Needs assistance Equipment used: Rolling walker (2 wheels) Transfers: Sit to/from Stand Sit to Stand: Contact guard assist                  Balance Overall balance assessment: Needs assistance Sitting-balance support: Feet supported Sitting balance-Leahy Scale: Normal     Standing balance support: Bilateral upper extremity supported, During functional activity, Reliant on assistive device for balance Standing balance-Leahy Scale: Good                             ADL either performed or assessed with clinical judgement   ADL Overall ADL's : Needs assistance/impaired Eating/Feeding: Set up;Sitting   Grooming: Wash/dry hands;Contact guard assist;Standing   Upper Body Bathing: Minimal assistance;Standing   Lower Body Bathing: Moderate assistance;Sitting/lateral leans   Upper Body Dressing : Minimal assistance;Standing;Sitting   Lower Body Dressing: Moderate assistance;Sitting/lateral leans;Sit to/from stand   Toilet Transfer: Contact guard assist;Ambulation;Rolling walker (2 wheels)   Toileting- Clothing Manipulation and Hygiene: Contact  guard assist       Functional mobility during ADLs: Contact guard assist;Rolling walker (2 wheels)       Vision   Vision Assessment?: No apparent visual deficits     Perception Perception: Not tested       Praxis Praxis: Not tested        Pertinent Vitals/Pain Pain Assessment Pain Assessment: No/denies pain     Extremity/Trunk Assessment Upper Extremity Assessment Upper Extremity Assessment: Generalized weakness   Lower Extremity Assessment Lower Extremity Assessment: Defer to PT evaluation   Cervical / Trunk Assessment Cervical / Trunk Assessment: Normal   Communication Communication Communication: No apparent difficulties   Cognition Arousal: Alert Behavior During Therapy: WFL for tasks assessed/performed Cognition: No apparent impairments                               Following commands: Intact       Cueing  General Comments   Cueing Techniques: Verbal cues  SpO2 down to high 80s on 4L O2 incr to 90s with cues for PLB   Exercises     Shoulder Instructions      Home Living Family/patient expects to be discharged to:: (P) Private residence Living Arrangements: Spouse/significant other (she is caregiver for her spouse who is on walker) Available Help at Discharge: Family;Available 24 hours/day Type of Home: House Home Access: Stairs to enter [employer] of Steps: 2 Entrance Stairs-Rails: Can reach both Home Layout: Two level;Able to live on main level with bedroom/bathroom;Bed/bath upstairs (having stair lift installed) Alternate Level Stairs-Number of Steps: 15 Alternate Level Stairs-Rails: Can reach both Bathroom Shower/Tub: Producer, television/film/video: Standard     Home Equipment: Grab bars - tub/shower;Grab bars - toilet;Shower seat - built [employer] fundraiser (2 wheels)   Additional Comments: on 4L O2 baseline at all times      Prior Functioning/Environment Prior Level of Function : Independent/Modified Independent;Driving             Mobility Comments: no AD, no other falls other than the one during last admission due to syncope event ADLs Comments: Prepares meals, occasional help for cleaning    OT Problem List: Decreased strength;Decreased  range of motion;Decreased activity tolerance;Impaired balance (sitting and/or standing);Cardiopulmonary status limiting activity   OT Treatment/Interventions: Self-care/ADL training;Therapeutic exercise;Energy conservation;DME and/or AE instruction;Therapeutic activities;Balance training;Patient/family education      OT Goals(Current goals can be found in the care plan section)   Acute Rehab OT Goals Patient Stated Goal: none stated OT Goal Formulation: With patient Time For Goal Achievement: 09/28/23 Potential to Achieve Goals: Good ADL Goals Pt Will Perform Upper Body Dressing: with modified independence;sitting Pt Will Perform Lower Body Dressing: with modified independence;sitting/lateral leans;sit to/from stand Pt Will Transfer to Toilet: with modified independence;ambulating;regular height toilet Pt Will Perform Tub/Shower Transfer: Tub transfer;Shower transfer;with modified independence;ambulating Additional ADL Goal #1: pt will verbalize x3 energy conservation strategies in prep for ADLs   OT Frequency:  Min 2X/week    Co-evaluation              AM-PAC OT "6 Clicks" Daily Activity     Outcome Measure Help from another person eating meals?: A Little Help from another person taking care of personal grooming?: A Little Help from another person toileting, which includes using toliet, bedpan, or urinal?: A Little Help from another person bathing (including washing, rinsing, drying)?: A Lot Help from another person to put on and  taking off regular upper body clothing?: A Little Help from another person to put on and taking off regular lower body clothing?: A Lot 6 Click Score: 16   End of Session Equipment Utilized During Treatment: Gait belt;Rolling walker (2 wheels);Oxygen Nurse Communication: Mobility status  Activity Tolerance: Patient tolerated treatment well Patient left: in chair;with call bell/phone within reach;with chair alarm set  OT Visit Diagnosis:  Unsteadiness on feet (R26.81);Other abnormalities of gait and mobility (R26.89);Muscle weakness (generalized) (M62.81)                Time: 1610-9604 OT Time Calculation (min): 25 min Charges:  OT General Charges $OT Visit: 1 Visit OT Evaluation $OT Eval Moderate Complexity: 1 Mod OT Treatments $Self Care/Home Management : 8-22 mins  Carver Fila, OTD, OTR/L [employer] [facility] Acute Rehab [phone]   Dalphine Handing 09/14/2023, 4:23 PM

## 2023-09-15 ENCOUNTER — Telehealth (HOSPITAL_COMMUNITY): Payer: Self-pay | Admitting: *Deleted

## 2023-09-15 DIAGNOSIS — J9621 Acute and chronic respiratory failure with hypoxia: Secondary | ICD-10-CM | POA: Diagnosis not present

## 2023-09-15 LAB — MRSA NEXT GEN BY PCR, NASAL: MRSA by PCR Next Gen: NOT DETECTED

## 2023-09-15 MED ORDER — SODIUM CHLORIDE 0.9 % IV SOLN
2.0000 g | INTRAVENOUS | Status: DC
  Administered 2023-09-15: 2 g via INTRAVENOUS
  Filled 2023-09-15 (×2): qty 20

## 2023-09-15 NOTE — Plan of Care (Signed)
   Problem: Education: Goal: Knowledge of General Education information will improve Description: Including pain rating scale, medication(s)/side effects and non-pharmacologic comfort measures Outcome: Progressing   Problem: Clinical Measurements: Goal: Ability to maintain clinical measurements within normal limits will improve Outcome: Progressing

## 2023-09-15 NOTE — Telephone Encounter (Signed)
 Patient identification verified by 2 forms. Marilynn Rail, RN    Called and spoke to patient  Informed patient:   -RN/provider aware of current admission status   -plan to follow up post discharge  Patient agrees with plan, no questions at this time

## 2023-09-15 NOTE — Telephone Encounter (Signed)
 Thanks for the heads up. Looks like she ended up getting admitted for influenza, superimposed pneumonia, and COPD exacerbation. If she is in the hospital, we can stop trying to reach her.  Thanks! Raidyn Breiner

## 2023-09-15 NOTE — Plan of Care (Signed)

## 2023-09-15 NOTE — Telephone Encounter (Signed)
 Spoke with pt in-person (admitted) regarding her setbacks (flu, afib, general debilitation). Planning HH PT and RN. She will need time to recover before returning to Pulmonary Rehab. Pt in agreement for discharge from program and get a new referral from her pulmonologist when she is stronger.  Ethelda Chick BS, ACSM-CEP 09/15/2023 10:50 AM

## 2023-09-15 NOTE — Progress Notes (Addendum)
 PROGRESS NOTE VI [name]  VWU:[id number] DOB: [birth year] DOA: 09/13/2023 PCP: Merri Brunette, MD  Brief Narrative/Hospital Course:  83 y.o. female with medical history significant for nonischemic cardiomyopathy EF 30 to 35%, chronic combined diastolic and systolic CHF, paroxysmal A-fib on Eliquis, ascending aortic aneurysm 4.5 cm, CVA in 2020, COPD on 4 L nasal cannula, hypertension, hyperlipidemia, history of breast cancer status post chemotherapy, radiation, lumpectomy in 2013, recently prescribed Macrobid 100 mg twice daily x 5 days by PCP (09/10/2023) for UTI, recently admitted by cardiology on 08/30/2023 and discharged on 09/02/2023 for chest pain for which she had LHC/RHC (08/31/23), who presents to the ER from home with complaints of shortness of breath and a productive cough with greenish sputum , in the ED workup with CT angio further labs-revealed influenza A with left upper and lower lobe consolidation possible atelectasis or infiltrates, emphysema, admitted oxygen 4 L saturating 93% admitted for acute on chronic hypoxic respiratory failure, pneumonia influenza and COPD exacerbation and elevated troponin flat due to demand ischemia.  Subjective: Seen this am on bedside chair Feels little better but still coughing n Overnight afebrile BP stable doing well on home oxygen setting  Assessment and plan  Acute on chronic hypoxic respiratory failure Influenza A infection Superimposed bacterial pneumonia Acute COPD exacerbation: At baseline on 4 L [place] initially on 6 L in the ED. CTA no PE-, but with focal consolidation L UL, LLL, emphysema, pulmonary artery hypertension. Continue Tamiflu, Yupelri/Brovana/Pulmicort, Solu-Medrol and antibiotics coverage for pneumonia. MRSA screen pending Continue ID, PTOT   Elevated troponin: NICM Chronic combined systolic and diastolic CHF with EF 30 to 35%: Suspect demand ischemia due to hypoxia, troponin flat in 50s no anginal symptoms. EKG- NO E/O  ischemia Of note she had cardiac cath on 08/31/2023 with patent coronary arteries no significant disease. BNP elevated but euvolemic on exam on admission.  Monitor volume status   Recent UTI: Was placed on antibiotics last Friday. UA is normal.   PAF: In NSR.  Continue home Eliquis and amiodarone    Mild hyponatremia: Resolved.     HLD: Continue Lipitor.   Chronic anemia/IDA: Hb stable 8-9 continue continue iron supplement Recent Labs  Lab 09/09/23 1659 09/13/23 1534 09/13/23 1546 09/14/23 0817  HGB 10.7* 9.9* 9.9*  10.2* 8.5*  HCT 31.8* 29.3* 29.0*  30.0* 24.6*     Chronic anxiety/depression Mood stable, continue Zoloft   Generalized weakness/debility deconditioning: PT OT to continue, advised home health PT  DVT prophylaxis: apixaban (ELIQUIS) tablet 2.5 mg Start: 09/14/23 0400 Code Status:   Code Status: Full Code Family Communication: plan of care discussed with patient at bedside. Patient status is: Remains hospitalized because of severity of illness Level of care: Telemetry Cardiac   Dispo: The patient is from: home w/ husband and independent and drives at baseline.            Anticipated disposition: TBD likely tomorrow  Objective: Vitals last 24 hrs: Vitals:   09/14/23 2041 09/14/23 2106 09/15/23 0353 09/15/23 0715  BP: 127/83  135/87 137/82  Pulse: 85  77 77  Resp: 16  16 20   Temp: 98.4 F (36.9 C)  98.3 F (36.8 C) 97.7 F (36.5 C)  TempSrc: Oral  Oral Oral  SpO2: 94% 95% 95% 96%  Weight:   57 kg   Height:       Weight change: 4.383 kg  Physical Examination: General exam: alert awake, oriented at baseline, older than stated age HEENT:Oral mucosa moist, Ear/Nose WNL grossly  Respiratory system: Bilaterally diminished BS,no use of accessory muscle Cardiovascular system: S1 & S2 +, No JVD. Gastrointestinal system: Abdomen soft,NT,ND, BS+ Nervous System: Alert, awake, moving all extremities,and following commands. Extremities: LE edema  neg,distal peripheral pulses palpable and warm.  Skin: No rashes,no icterus. MSK: Normal muscle bulk,tone, power   Medications reviewed:  Scheduled Meds:  amiodarone  200 mg Oral Daily   apixaban  2.5 mg Oral BID   arformoterol  15 mcg Nebulization BID   atorvastatin  20 mg Oral Daily   budesonide (PULMICORT) nebulizer solution  0.25 mg Nebulization BID   cholecalciferol  1,000 Units Oral Daily   ezetimibe  10 mg Oral Daily   ferrous sulfate  325 mg Oral QODAY   fluticasone  2 spray Each Nare Daily   guaiFENesin  600 mg Oral BID   ipratropium-albuterol  3 mL Nebulization BID   methylPREDNISolone (SOLU-MEDROL) injection  40 mg Intravenous Daily   oseltamivir  30 mg Oral BID   revefenacin  175 mcg Nebulization Daily   sertraline  200 mg Oral Daily   Continuous Infusions:  cefTRIAXone (ROCEPHIN)  IV        Diet Order             Diet Heart Room service appropriate? Yes; Fluid consistency: Thin  Diet effective now                  Intake/Output Summary (Last 24 hours) at 09/15/2023 1052 Last data filed at 09/15/2023 0547 Gross per 24 hour  Intake 420 ml  Output 800 ml  Net -380 ml   Net IO Since Admission: -1,490 mL [09/15/23 1052]  Wt Readings from Last 3 Encounters:  09/15/23 57 kg  09/09/23 53.7 kg  09/02/23 53.8 kg     Unresulted Labs (From admission, onward)     Start     Ordered   09/15/23 0851  MRSA Next Gen by PCR, Nasal  ONCE - URGENT,   URGENT        09/15/23 0851          Data Reviewed: I have personally reviewed following labs and imaging studies ( see epic result tab) CBC: Recent Labs  Lab 09/09/23 1659 09/13/23 1534 09/13/23 1546 09/14/23 0817  WBC 4.4 9.1  --  7.8  NEUTROABS  --  8.4*  --   --   HGB 10.7* 9.9* 9.9*  10.2* 8.5*  HCT 31.8* 29.3* 29.0*  30.0* 24.6*  MCV 97 94.5  --  93.5  PLT 144* 277  --  290   CMP: Recent Labs  Lab 09/09/23 1659 09/13/23 1534 09/13/23 1546 09/14/23 0817  NA 136 130* 131*  131* 135  K 4.0  3.4* 3.5  3.5 3.5  CL 99 96* 98 100  CO2 22 23  --  25  GLUCOSE 116* 137* 137* 132*  BUN 22 14 13 18   CREATININE 1.23* 0.93 0.80 1.00  CALCIUM 9.0 9.2  --  8.6*  MG  --   --   --  1.7  PHOS  --   --   --  2.9   GFR: Estimated Creatinine Clearance: 38.1 mL/min (by C-G formula based on SCr of 1 mg/dL). Recent Labs  Lab 09/09/23 1659 09/13/23 1534  AST 30 25  ALT 30 28  ALKPHOS 43* 43  BILITOT 0.2 1.1  PROT 5.8* 6.5  ALBUMIN 4.2 3.2*   Recent Labs  Lab 09/13/23 1534  LIPASE 26   No results for  input(s): "AMMONIA" in the last 168 hours.  Recent Results (from the past 240 hours)  Resp panel by RT-PCR (RSV, Flu A&B, Covid) Anterior Nasal Swab     Status: Abnormal   Collection Time: 09/13/23  3:01 PM   Specimen: Anterior Nasal Swab  Result Value Ref Range Status   SARS Coronavirus 2 by RT PCR NEGATIVE NEGATIVE Final   Influenza A by PCR POSITIVE (A) NEGATIVE Final   Influenza B by PCR NEGATIVE NEGATIVE Final    Comment: (NOTE) The Xpert Xpress SARS-CoV-2/FLU/RSV plus assay is intended as an aid in the diagnosis of influenza from Nasopharyngeal swab specimens and should not be used as a sole basis for treatment. Nasal washings and aspirates are unacceptable for Xpert Xpress SARS-CoV-2/FLU/RSV testing.  Fact Sheet for Patients: BloggerCourse.com  Fact Sheet for Healthcare Providers: SeriousBroker.it  This test is not yet approved or cleared by the [place] FDA and has been authorized for detection and/or diagnosis of SARS-CoV-2 by FDA under an Emergency Use Authorization (EUA). This EUA will remain in effect (meaning this test can be used) for the duration of the COVID-19 declaration under Section 564(b)(1) of the Act, 21 U.S.C. section 360bbb-3(b)(1), unless the authorization is terminated or revoked.     Resp Syncytial Virus by PCR NEGATIVE NEGATIVE Final    Comment: (NOTE) Fact Sheet for  Patients: BloggerCourse.com  Fact Sheet for Healthcare Providers: SeriousBroker.it  This test is not yet approved or cleared by the [place] FDA and has been authorized for detection and/or diagnosis of SARS-CoV-2 by FDA under an Emergency Use Authorization (EUA). This EUA will remain in effect (meaning this test can be used) for the duration of the COVID-19 declaration under Section 564(b)(1) of the Act, 21 U.S.C. section 360bbb-3(b)(1), unless the authorization is terminated or revoked.  Performed at [facility], [address] [place], [place] [address]   Blood Culture (routine x 2)     Status: None (Preliminary result)   Collection Time: 09/13/23  3:34 PM   Specimen: BLOOD LEFT ARM  Result Value Ref Range Status   Specimen Description BLOOD LEFT ARM  Final   Special Requests   Final    BOTTLES DRAWN AEROBIC AND ANAEROBIC Blood Culture adequate volume   Culture   Final    NO GROWTH 2 DAYS Performed at [facility], [address], [place], [place] [address]    Report Status PENDING  Incomplete  Blood Culture (routine x 2)     Status: None (Preliminary result)   Collection Time: 09/13/23  3:35 PM   Specimen: BLOOD  Result Value Ref Range Status   Specimen Description BLOOD BLOOD RIGHT ARM  Final   Special Requests   Final    BOTTLES DRAWN AEROBIC AND ANAEROBIC Blood Culture adequate volume   Culture   Final    NO GROWTH 2 DAYS Performed at [facility], [address], [place], [place] [address]    Report Status PENDING  Incomplete   Antimicrobials/Microbiology: Anti-infectives (From admission, onward)    Start     Dose/Rate Route Frequency Ordered Stop   09/15/23 2200  cefTRIAXone (ROCEPHIN) 2 g in sodium chloride 0.9 % 100 mL IVPB        2 g 200 mL/hr over 30 Minutes Intravenous Every 24 hours 09/15/23 1043     09/14/23 1000  oseltamivir (TAMIFLU) capsule 30 mg       Placed in "Followed by"  Linked Group   30  mg Oral 2 times daily 09/13/23 2149 09/18/23 2159   09/14/23 0800  cefTRIAXone (ROCEPHIN) 1 g in sodium chloride 0.9 % 100 mL IVPB  Status:  Discontinued        1 g 200 mL/hr over 30 Minutes Intravenous Every 24 hours 09/13/23 2152 09/13/23 2237   09/13/23 2330  oseltamivir (TAMIFLU) capsule 75 mg       Placed in "Followed by" Linked Group   75 mg Oral  Once 09/13/23 2149 09/14/23 0013   09/13/23 2245  ceFEPIme (MAXIPIME) 2 g in sodium chloride 0.9 % 100 mL IVPB  Status:  Discontinued        2 g 200 mL/hr over 30 Minutes Intravenous Every 12 hours 09/13/23 2237 09/15/23 1043   09/13/23 2200  oseltamivir (TAMIFLU) capsule 75 mg  Status:  Discontinued        75 mg Oral 2 times daily 09/13/23 2147 09/13/23 2149   09/13/23 2200  doxycycline (VIBRAMYCIN) 100 mg in sodium chloride 0.9 % 250 mL IVPB  Status:  Discontinued        100 mg 125 mL/hr over 120 Minutes Intravenous Every 12 hours 09/13/23 2152 09/13/23 2237   09/13/23 1515  ceFEPIme (MAXIPIME) 2 g in sodium chloride 0.9 % 100 mL IVPB        2 g 200 mL/hr over 30 Minutes Intravenous  Once 09/13/23 1501 09/13/23 1641   09/13/23 1515  metroNIDAZOLE (FLAGYL) IVPB 500 mg        500 mg 100 mL/hr over 60 Minutes Intravenous  Once 09/13/23 1501 09/13/23 1903   09/13/23 1515  vancomycin (VANCOCIN) IVPB 1000 mg/200 mL premix        1,000 mg 200 mL/hr over 60 Minutes Intravenous  Once 09/13/23 1501 09/13/23 1903         Component Value Date/Time   SDES BLOOD BLOOD RIGHT ARM 09/13/2023 1535   SPECREQUEST  09/13/2023 1535    BOTTLES DRAWN AEROBIC AND ANAEROBIC Blood Culture adequate volume   CULT  09/13/2023 1535    NO GROWTH 2 DAYS Performed at [facility], [address], [place], [place] [address]    REPTSTATUS PENDING 09/13/2023 1535     Radiology Studies: CT Angio Chest PE W and/or Wo Contrast Result Date: 09/13/2023 CLINICAL DATA:  Pulmonary embolism suspected, high probability. EXAM: CT ANGIOGRAPHY CHEST  WITH CONTRAST TECHNIQUE: Multidetector CT imaging of the chest was performed using the standard protocol during bolus administration of intravenous contrast. Multiplanar CT image reconstructions and MIPs were obtained to evaluate the vascular anatomy. RADIATION DOSE REDUCTION: This exam was performed according to the departmental dose-optimization program which includes automated exposure control, adjustment of the mA and/or kV according to patient size and/or use of iterative reconstruction technique. CONTRAST:  75mL OMNIPAQUE IOHEXOL 350 MG/ML SOLN COMPARISON:  11/04/2022. FINDINGS: Cardiovascular: The heart is enlarged and there is no pericardial effusion. Three-vessel coronary artery calcifications are noted. There is atherosclerotic calcification of the aorta with aneurysmal dilatation of the ascending aorta measuring 4.5 cm. The pulmonary trunk is distended measuring 4.3 cm. No evidence of pulmonary embolism is seen. Mediastinum/Nodes: No enlarged mediastinal, hilar, or axillary lymph nodes. Thyroid gland, trachea, and esophagus demonstrate no significant findings. Lungs/Pleura: Paraseptal and centrilobular emphysematous changes are present in the lungs. Biapical pleural scarring is noted. There is bronchiectasis bilaterally with consolidation in the lingular segment of the left upper lobe and the medial aspect of the left lower lobe. Fibrotic changes are noted in the anterior aspect  of the right upper lobe. No effusion or pneumothorax is seen. A few scattered pulmonary nodules are noted in the right upper lobe measuring up to 3 mm, not significantly changed from the prior exam. No new nodule is seen. Upper Abdomen: Stones are present within the gallbladder. No acute abnormality. Musculoskeletal: Sternotomy wires are noted. Degenerative changes are present in the thoracic spine. Review of the MIP images confirms the above findings. IMPRESSION: 1. No evidence of pulmonary embolism. 2. Focal consolidation in the  left upper and lower lobes, possible atelectasis or infiltrate. 3. Emphysema. 4. Cardiomegaly with coronary artery calcifications. 5. Distended pulmonary trunk which may be associated with pulmonary artery hypertension. 6. Aortic atherosclerosis with aneurysmal dilatation of the ascending aorta measuring 4.5 cm. Ascending thoracic aortic aneurysm. Recommend semi-annual imaging followup by CTA or MRA and referral to cardiothoracic surgery if not already obtained. This recommendation follows 2010 ACCF/AHA/AATS/ACR/ASA/SCA/SCAI/SIR/STS/SVM Guidelines for the Diagnosis and Management of Patients With Thoracic Aortic Disease. Circulation. 2010; 121: E454-U981. Aortic aneurysm NOS (ICD10-I71.9) Electronically Signed   By: Thornell Sartorius M.D.   On: 09/13/2023 21:12   DG Chest Port 1 View Result Date: 09/13/2023 CLINICAL DATA:  Possible sepsis EXAM: PORTABLE CHEST 1 VIEW COMPARISON:  08/30/2023, 08/16/2023, CT chest 11/04/2022 FINDINGS: Emphysema. Increased interstitial opacities and patchy peripheral left lower lung opacity compared to prior. Sternotomy and valve prosthesis. Stable cardiomediastinal silhouette with aortic atherosclerosis. No pneumothorax IMPRESSION: Emphysema. Increased interstitial opacities and patchy peripheral left lower lung opacity compared to prior, possible pneumonia. Electronically Signed   By: Jasmine Pang M.D.   On: 09/13/2023 18:06     LOS: 2 days   Total time spent in review of labs and imaging, patient evaluation, formulation of plan, documentation and communication with patient/family: 35 minutes  Dana Boast, MD [facility] 09/15/2023, 10:52 AM

## 2023-09-15 NOTE — Progress Notes (Signed)
 Discharge Progress Report  Patient Details  Name: Dana Pruitt MRN: [id number] Date of Birth: June 14, 1941 Referring Provider:   Doristine Devoid Pulmonary Rehab Walk Test from 07/26/2023 in [facility]  Referring Provider Dr. Sherene Sires        Number of Visits: 4  Reason for Discharge:  Early Exit:  Personal  Smoking History:  Social History   Tobacco Use  Smoking Status Former   Current packs/day: 0.00   Average packs/day: 3.0 packs/day for 32.1 years (96.3 ttl pk-yrs)   Types: Cigarettes   Start date: 05/19/1958   Quit date: 06/30/1990   Years since quitting: 33.2  Smokeless Tobacco Never  Tobacco Comments   Stopped smoking for 2-3 years during second child.     Diagnosis:  No diagnosis found.  ADL [facility]:  Pulmonary Assessment Scores     Row Name 07/26/23 1204         ADL [facility]   ADL Phase Entry     SOB Score total 52       CAT Score   CAT Score 24       mMRC Score   mMRC Score 2              Initial Exercise Prescription:  Initial Exercise Prescription - 07/26/23 1300       Date of Initial Exercise RX and Referring Provider   Date 07/26/23    Referring Provider Dr. Sherene Sires    Expected Discharge Date 10/21/23      Oxygen   Oxygen Continuous    Liters 3    Maintain Oxygen Saturation 88% or higher      NuStep   Level 1    SPM 75    Minutes 15    METs 2      Track   Laps 10    Minutes 15    METs 2.54      Prescription Details   Frequency (times per week) 2    Duration Progress to 30 minutes of continuous aerobic without signs/symptoms of physical distress      Intensity   THRR 40-80% of Max Heartrate 56-111    Ratings of Perceived Exertion 11-13    Perceived Dyspnea 0-4      Progression   Progression Continue to progress workloads to maintain intensity without signs/symptoms of physical distress.      Resistance Training   Training Prescription Yes    Weight red bands    Reps  10-15             Discharge Exercise Prescription (Final Exercise Prescription Changes):  Exercise Prescription Changes - 08/26/23 0959       Response to Exercise   Blood Pressure (Admit) 118/70    Blood Pressure (Exit) 106/62    Heart Rate (Admit) 70 bpm    Heart Rate (Exercise) 89 bpm    Heart Rate (Exit) 66 bpm    Oxygen Saturation (Admit) 96 %    Oxygen Saturation (Exercise) 92 %    Oxygen Saturation (Exit) 95 %    Rating of Perceived Exertion (Exercise) 13    Perceived Dyspnea (Exercise) 3    Duration Continue with 30 min of aerobic exercise without signs/symptoms of physical distress.    Intensity THRR unchanged      Progression   Progression Continue to progress workloads to maintain intensity without signs/symptoms of physical distress.      Paramedic Prescription  Yes    Weight blue bands    Reps 10-15    Time 10 Minutes      Oxygen   Oxygen Continuous    Liters 4      Treadmill   MPH 2.1    Grade 1    Minutes 15    METs 2.8      NuStep   Level 2    SPM 70    Minutes 15    METs 2.6      Oxygen   Maintain Oxygen Saturation 88% or higher             Functional Capacity:  6 Minute Walk     Row Name 07/26/23 1351         6 Minute Walk   Phase Initial     Distance 1080 feet     Walk Time 6 minutes     # of Rest Breaks 2     MPH 2.05     METS 2.41     RPE 14     Perceived Dyspnea  3     VO2 Peak 8.45     Symptoms No     Resting HR 75 bpm     Resting BP 140/78     Resting Oxygen Saturation  92 %     Exercise Oxygen Saturation  during 6 min walk 75 %     Max Ex. HR 89 bpm     Max Ex. BP 180/96     2 Minute Post BP 180/90       Interval HR   1 Minute HR 77     2 Minute HR 76     3 Minute HR 89     4 Minute HR 87     5 Minute HR 89     6 Minute HR 87     2 Minute Post HR 67     Interval Heart Rate? Yes       Interval Oxygen   Interval Oxygen? Yes     Baseline Oxygen Saturation % 92 %     1 Minute  Oxygen Saturation % 75 %  stopped 1:10-1:26, placed on 2L     1 Minute Liters of Oxygen 0 L     2 Minute Oxygen Saturation % 90 %     2 Minute Liters of Oxygen 2 L     3 Minute Oxygen Saturation % 82 %  stopped 2:40-2:50, placed on 3L     3 Minute Liters of Oxygen 3 L     4 Minute Oxygen Saturation % 94 %     4 Minute Liters of Oxygen 3 L     5 Minute Oxygen Saturation % 92 %     5 Minute Liters of Oxygen 3 L     6 Minute Oxygen Saturation % 90 %     6 Minute Liters of Oxygen 3 L     2 Minute Post Oxygen Saturation % 100 %     2 Minute Post Liters of Oxygen 3 L              Psychological, QOL, Others - Outcomes: PHQ 2/9:    07/26/2023   11:56 AM 05/24/2019    2:17 PM 04/03/2019    4:48 PM  Depression screen PHQ 2/9  Decreased Interest 2 0 0  Down, Depressed, Hopeless 2 0 0  PHQ - 2 Score 4 0 0  Altered sleeping 2  Tired, decreased energy 3    Change in appetite 1    Feeling bad or failure about yourself  0    Trouble concentrating 0    Moving slowly or fidgety/restless 0    Suicidal thoughts 0    PHQ-9 Score 10    Difficult doing work/chores Somewhat difficult      Quality of Life:   Personal Goals: Goals established at orientation with interventions provided to work toward goal.  Personal Goals and Risk Factors at Admission - 07/26/23 1127       Core Components/Risk Factors/Patient Goals on Admission   Improve shortness of breath with ADL's Yes    Intervention Provide education, individualized exercise plan and daily activity instruction to help decrease symptoms of SOB with activities of daily living.    Expected Outcomes Short Term: Improve cardiorespiratory fitness to achieve a reduction of symptoms when performing ADLs;Long Term: Be able to perform more ADLs without symptoms or delay the onset of symptoms    Increase knowledge of respiratory medications and ability to use respiratory devices properly  Yes    Intervention Provide education and demonstration  as needed of appropriate use of medications, inhalers, and oxygen therapy.    Expected Outcomes Short Term: Achieves understanding of medications use. Understands that oxygen is a medication prescribed by physician. Demonstrates appropriate use of inhaler and oxygen therapy.;Long Term: Maintain appropriate use of medications, inhalers, and oxygen therapy.    Stress Yes    Intervention Offer individual and/or small group education and counseling on adjustment to heart disease, stress management and health-related lifestyle change. Teach and support self-help strategies.;Refer participants experiencing significant psychosocial distress to appropriate mental health specialists for further evaluation and treatment. When possible, include family members and significant others in education/counseling sessions.    Expected Outcomes Short Term: Participant demonstrates changes in health-related behavior, relaxation and other stress management skills, ability to obtain effective social support, and compliance with psychotropic medications if prescribed.;Long Term: Emotional wellbeing is indicated by absence of clinically significant psychosocial distress or social isolation.              Personal Goals Discharge:  Goals and Risk Factor Review     Row Name 08/04/23 1013 09/02/23 1636 09/15/23 1341         Core Components/Risk Factors/Patient Goals Review   Personal Goals Review Improve shortness of breath with ADL's;Develop more efficient breathing techniques such as purse lipped breathing and diaphragmatic breathing and practicing self-pacing with activity.;Increase knowledge of respiratory medications and ability to use respiratory devices properly. Weight Management/Obesity;Improve shortness of breath with ADL's;Develop more efficient breathing techniques such as purse lipped breathing and diaphragmatic breathing and practicing self-pacing with activity.;Increase knowledge of respiratory medications and  ability to use respiratory devices properly.;Stress Weight Management/Obesity;Improve shortness of breath with ADL's;Develop more efficient breathing techniques such as purse lipped breathing and diaphragmatic breathing and practicing self-pacing with activity.;Increase knowledge of respiratory medications and ability to use respiratory devices properly.;Stress     Review Unable to assess, Dana Pruitt has not started the program yet Dana Pruitt has only attended 4 sessions so far. Unfortunately Dana Pruitt was recently hospitalized for heart failure. Goal progressing for improving shortness of breath with ADL's. She is currently able to maintain sats >88% on 3-4L while exercising. Goal progressing for developing more efficient breathing techniques such as purse lipped breathing and diaphragmatic breathing; and practicing self-pacing with activity. Goal progressing for increase knowledge of respiratory medications and ability to use respiratory devices properly. Goal progressing for maintaining  weight. Goal progressing for reducing stress. Dana Pruitt was discharged from the Pulmonary rehab program on 09/15/23 due to multiple hospital admissions. Unfortunately, she did not meet any of her goals due to only attending 4 sessions. Hopefully Dana Pruitt will be able to start the program at a later date.     Expected Outcomes For Dana Pruitt to develop more efficient breathing techniques such as purse lipped breathing and diaphragmatic breathing; and practice self-pacing with activity and improve her shortness of breath with ADL's, to increase her knowledge of respiratory medications and correctly use respiratory devices, to improve her shortness of breath with ADLs For Dana Pruitt to develop more efficient breathing techniques such as purse lipped breathing and diaphragmatic breathing; and practice self-pacing with activity, improve her shortness of breath with ADL's, to increase her knowledge of respiratory medications and correctly use respiratory  devices, to increase knowledge of respiratory medications and ability to use respiratory devices properly and decrease stress. Pt will continue to apply the knowledge she learned in PR              Exercise Goals and Review:  Exercise Goals     Row Name 07/26/23 1128             Exercise Goals   Increase Physical Activity Yes       Intervention Provide advice, education, support and counseling about physical activity/exercise needs.;Develop an individualized exercise prescription for aerobic and resistive training based on initial evaluation findings, risk stratification, comorbidities and participant's personal goals.       Expected Outcomes Short Term: Attend rehab on a regular basis to increase amount of physical activity.;Long Term: Add in home exercise to make exercise part of routine and to increase amount of physical activity.;Long Term: Exercising regularly at least 3-5 days a week.       Increase Strength and Stamina Yes       Intervention Provide advice, education, support and counseling about physical activity/exercise needs.;Develop an individualized exercise prescription for aerobic and resistive training based on initial evaluation findings, risk stratification, comorbidities and participant's personal goals.       Expected Outcomes Short Term: Increase workloads from initial exercise prescription for resistance, speed, and METs.;Short Term: Perform resistance training exercises routinely during rehab and add in resistance training at home;Long Term: Improve cardiorespiratory fitness, muscular endurance and strength as measured by increased METs and functional capacity ( )       Able to understand and use rate of perceived exertion (RPE) scale Yes       Intervention Provide education and explanation on how to use RPE scale       Expected Outcomes Short Term: Able to use RPE daily in rehab to express subjective intensity level;Long Term:  Able to use RPE to guide intensity  level when exercising independently       Able to understand and use Dyspnea scale Yes       Intervention Provide education and explanation on how to use Dyspnea scale       Expected Outcomes Short Term: Able to use Dyspnea scale daily in rehab to express subjective sense of shortness of breath during exertion;Long Term: Able to use Dyspnea scale to guide intensity level when exercising independently       Knowledge and understanding of Target Heart Rate Range (THRR) Yes       Intervention Provide education and explanation of THRR including how the numbers were predicted and where they are located for reference  Expected Outcomes Short Term: Able to state/look up THRR;Long Term: Able to use THRR to govern intensity when exercising independently;Short Term: Able to use daily as guideline for intensity in rehab       Understanding of Exercise Prescription Yes       Intervention Provide education, explanation, and written materials on patient's individual exercise prescription       Expected Outcomes Short Term: Able to explain program exercise prescription;Long Term: Able to explain home exercise prescription to exercise independently                Exercise Goals Re-Evaluation:  Exercise Goals Re-Evaluation     Row Name 08/04/23 1411 09/02/23 1237 09/15/23 1323         Exercise Goal Re-Evaluation   Exercise Goals Review Increase Physical Activity;Increase Strength and Stamina;Able to understand and use rate of perceived exertion (RPE) scale;Knowledge and understanding of Target Heart Rate Range (THRR);Understanding of Exercise Prescription;Able to understand and use Dyspnea scale Increase Physical Activity;Increase Strength and Stamina;Able to understand and use rate of perceived exertion (RPE) scale;Knowledge and understanding of Target Heart Rate Range (THRR);Understanding of Exercise Prescription;Able to understand and use Dyspnea scale Increase Physical Activity;Increase Strength and  Stamina;Able to understand and use rate of perceived exertion (RPE) scale;Knowledge and understanding of Target Heart Rate Range (THRR);Understanding of Exercise Prescription;Able to understand and use Dyspnea scale     Comments Pt will begin exercise 08/05/23. Will monitor and progress as tolerated. Pt has completed 4 exercise sessions. She has missed multiple sessions for illness and is currently in the hospital for HF/Afib. She is exercising on the recumbent stepper for 15 min, level 2, METs 2.7. She is also walking on the treadmill for 15 min, 2.1 mph, 1% incline, METs 2.8. She has been doing fair with exercise in the program. Performs warm up and cool down without limitations. Using blue bands now. She will be out for a couple of weeks. Will progress as able. Pt completed 4 exercise sessions and is discharged due to ongoing illness. She hopes to recover and then receive another referral for PR.     Expected Outcomes Through exercise at rehab and home, the patient will decrease shortness of breath of daily activities and feel confident in carrying out an exercise regimen at home. Through exercise at rehab and home, the patient will decrease shortness of breath of daily activities and feel confident in carrying out an exercise regimen at home. Through exercise at rehab and home, the patient will decrease shortness of breath of daily activities and feel confident in carrying out an exercise regimen at home.              Nutrition & Weight - Outcomes:    Nutrition:  Nutrition Therapy & Goals - 09/03/23 0852       Nutrition Therapy   Diet General Healthy Diet    Drug/Food Interactions Statins/Certain Fruits      Personal Nutrition Goals   Nutrition Goal Patient to identify strategies for weight maintenance/weight gain of 0.5-2.0# per week.    Comments Dana Pruitt has medical history of CHF, COPD2, HTN, hx of TAVR, hyperlipidemia, pulmonary HTN. She has not attended pulmonary rehab since 08/26/23 due  to hospitalization/right heart cath. Her BMI is low for age; however, she has maintained her weight over the last year. Will continue to monitor weight upon patient's return to rehab program. Lipids and LDL remain at goal. Patient will benefit from participation in pulmonary rehab for nutrition, exercise, and  lifestyle modification support.      Intervention Plan   Intervention Nutrition handout(s) given to patient.;Prescribe, educate and counsel regarding individualized specific dietary modifications aiming towards targeted core components such as weight, hypertension, lipid management, diabetes, heart failure and other comorbidities.    Expected Outcomes Short Term Goal: Understand basic principles of dietary content, such as calories, fat, sodium, cholesterol and nutrients.;Long Term Goal: Adherence to prescribed nutrition plan.             Nutrition Discharge:   Education Questionnaire Score:  Knowledge Questionnaire Score - 07/26/23 1204       Knowledge Questionnaire Score   Pre Score 14/18             Goals reviewed with patient; copy given to patient.

## 2023-09-15 NOTE — Telephone Encounter (Signed)
 3rd attempt to call patient, no answer, left message requesting a call back Nursing will await for patient to return call   Per chart review patient is currently admitted

## 2023-09-15 NOTE — Progress Notes (Signed)
 Occupational Therapy Treatment Patient Details Name: Dana Pruitt MRN: [id number] DOB: 05/04/1941 Today's Date: 09/15/2023   History of present illness Dana Pruitt is a 83 y.o. female with medical history significant for nonischemic cardiomyopathy EF 30 to 35%, chronic combined diastolic and systolic CHF, paroxysmal A-fib on Eliquis, ascending aortic aneurysm 4.5 cm, CVA in 2020, COPD on 4 L nasal cannula, hypertension, hyperlipidemia, history of breast cancer status post chemotherapy, radiation, lumpectomy in 2013, recently prescribed Macrobid 100 mg twice daily x 5 days by PCP (09/10/2023) for UTI, recently admitted by cardiology on 08/30/2023 and discharged on 09/02/2023 for chest pain for which she had LHC/RHC (08/31/23), who presents to the ER from home 09/13/23 with complaints of shortness of breath and a productive cough with greenish sputum .  Associated with subjective fevers and generalized weakness.  Symptoms started sometimes after her discharge from the hospital.  Presents to the ER for further evaluation. Found to be Flu +   OT comments  Pt making gains with therapy. Received supine and able to transfer EOB with supervision. BP/O2 monitored throughout session (152/92 supine, 130/90 seated in chair) (Pt on 4L O2, noted to drop with ambulation but recovers in sitting). Pt requesting to use BSC and able to transfer self using step pivot with CGA for safety. Pt able to complete hygiene and manage clothing. Following, pt ambulated short distance to sink with RW with supervision. Completed grooming, hygiene, and self care while seated at sink with supervision. Following self care, pt required min assist to don robe (thread arms). Pt able to perform sit<>stand with RW under supervision and ambulate to recliner. Once in recliner pt donned socks with supervision using 4-figure method. Pt left in recliner EOS with all needs met. Acute OT to continue to follow to address established goals to facilitate  DC to next venue of care.        If plan is discharge home, recommend the following:  A little help with walking and/or transfers;A lot of help with bathing/dressing/bathroom;Assistance with cooking/housework;Assist for transportation;Help with stairs or ramp for entrance   Equipment Recommendations  None recommended by OT    Recommendations for Other Services      Precautions / Restrictions Precautions Precautions: Fall Recall of Precautions/Restrictions: Intact Precaution/Restrictions Comments: monitor O2 Restrictions Weight Bearing Restrictions Per Provider Order: No       Mobility Bed Mobility Overal bed mobility: Independent             General bed mobility comments: pt able to transfer supine to EOB    Transfers Overall transfer level: Needs assistance Equipment used: Rolling walker (2 wheels) Transfers: Sit to/from Stand, Bed to chair/wheelchair/BSC Sit to Stand: Contact guard assist     Step pivot transfers: Contact guard assist     General transfer comment: CGA for safety, balance maintained     Balance Overall balance assessment: Needs assistance Sitting-balance support: Feet supported, No upper extremity supported Sitting balance-Leahy Scale: Normal     Standing balance support: Bilateral upper extremity supported, During functional activity, Reliant on assistive device for balance Standing balance-Leahy Scale: Fair Standing balance comment: able to stand well however reliant on RW for support                           ADL either performed or assessed with clinical judgement   ADL Overall ADL's : Needs assistance/impaired     Grooming: Wash/dry hands;Wash/dry face;Oral care;Brushing hair;Applying deodorant;Supervision/safety;Sitting Grooming Details (  indicate cue type and reason): completed seated at sink         Upper Body Dressing : Minimal assistance;Sitting Upper Body Dressing Details (indicate cue type and reason): to don  personal robe\ Lower Body Dressing: Supervision/safety;Sitting/lateral leans Lower Body Dressing Details (indicate cue type and reason): to don socks, pt used 4 figure method Toilet Transfer: Contact guard Therapist, sports Details (indicate cue type and reason): transferred to [facility] from EOB with CGA for safety Toileting- Clothing Manipulation and Hygiene: Supervision/safety Toileting - Clothing Manipulation Details (indicate cue type and reason): able to manage clothing and complete hygiene with supervision and assitance only with line management     Functional mobility during ADLs: Contact guard assist;Rolling walker (2 wheels) General ADL Comments: completed seated at sink with O2 monitored    Extremity/Trunk Assessment              Vision       Perception     Praxis     Communication Communication Communication: No apparent difficulties   Cognition Arousal: Alert Behavior During Therapy: WFL for tasks assessed/performed Cognition: No apparent impairments                               Following commands: Intact        Cueing   Cueing Techniques: Verbal cues  Exercises      Shoulder Instructions       General Comments BP monitored, 152/92 supine, 130/90 in chair, remained in range throughout session. Pt on 4L O2, noted to drop slightly with ambulation but able to recover once seated    Pertinent Vitals/ Pain       Pain Assessment Pain Assessment: No/denies pain Pain Intervention(s): Monitored during session  Home Living                                          Prior Functioning/Environment              Frequency  Min 2X/week        Progress Toward Goals  OT Goals(current goals can now be found in the care plan section)  Progress towards OT goals: Progressing toward goals  Acute Rehab OT Goals Patient Stated Goal: get well OT Goal Formulation: With patient Time For Goal Achievement:  09/28/23 Potential to Achieve Goals: Good ADL Goals Pt Will Perform Upper Body Dressing: with modified independence;sitting Pt Will Perform Lower Body Dressing: with modified independence;sitting/lateral leans;sit to/from stand Pt Will Transfer to Toilet: with modified independence;ambulating;regular height toilet Pt Will Perform Tub/Shower Transfer: Tub transfer;Shower transfer;with modified independence;ambulating Additional ADL Goal #1: pt will verbalize x3 energy conservation strategies in prep for ADLs  Plan      Co-evaluation                 AM-PAC OT "6 Clicks" Daily Activity     Outcome Measure   Help from another person eating meals?: A Little Help from another person taking care of personal grooming?: A Little Help from another person toileting, which includes using toliet, bedpan, or urinal?: A Little Help from another person bathing (including washing, rinsing, drying)?: A Little Help from another person to put on and taking off regular upper body clothing?: A Little Help from another person to put on and taking off regular lower body clothing?: A Little  6 Click Score: 18    End of Session Equipment Utilized During Treatment: Gait belt;Rolling walker (2 wheels);Oxygen  OT Visit Diagnosis: Unsteadiness on feet (R26.81);Other abnormalities of gait and mobility (R26.89);Muscle weakness (generalized) (M62.81)   Activity Tolerance Patient tolerated treatment well   Patient Left in chair;with call bell/phone within reach;with chair alarm set   Nurse Communication Mobility status        Time: 0454-0981 OT Time Calculation (min): 27 min  Charges: OT General Charges $OT Visit: 1 Visit OT Treatments $Self Care/Home Management : 23-37 mins  Bevan Vu, BS, OTA/S   Nataleigh Griffin 09/15/2023, 10:18 AM

## 2023-09-15 NOTE — Progress Notes (Signed)
 Mobility Specialist Progress Note;   09/15/23 1152  Mobility  Activity Ambulated with assistance in hallway  Level of Assistance Contact guard assist, steadying assist  Assistive Device Front wheel walker  Distance Ambulated (ft) 200 ft  Activity Response Tolerated well  Mobility Referral Yes  Mobility visit 1 Mobility  Mobility Specialist Start Time (ACUTE ONLY) 1152  Mobility Specialist Stop Time (ACUTE ONLY) 1216  Mobility Specialist Time Calculation (min) (ACUTE ONLY) 24 min    During-mobility: SPO2 89-95% 4LO2 Post-mobility: SPO2 94% 4LO2  Pt eager for mobility. On 4LO2 upon arrival. Required MinG assistance during ambulation for safety. Ambulated on 4LO2, VSS throughout. Encouraged PLB while ambulating. No c/o when asked. Pt requested to use BSC at EOS, void successful. Pt returned back to bed with all needs met, alarm on.   Caesar Bookman Mobility Specialist Please contact via SecureChat or [employer] [phone]

## 2023-09-15 NOTE — Telephone Encounter (Signed)
Pt returning nurse call. Please advise.

## 2023-09-16 ENCOUNTER — Other Ambulatory Visit (HOSPITAL_COMMUNITY): Payer: Self-pay

## 2023-09-16 ENCOUNTER — Encounter (HOSPITAL_COMMUNITY): Payer: Medicare Other

## 2023-09-16 ENCOUNTER — Encounter: Payer: Self-pay | Admitting: Oncology

## 2023-09-16 DIAGNOSIS — J9621 Acute and chronic respiratory failure with hypoxia: Secondary | ICD-10-CM | POA: Diagnosis not present

## 2023-09-16 MED ORDER — OSELTAMIVIR PHOSPHATE 30 MG PO CAPS
30.0000 mg | ORAL_CAPSULE | Freq: Two times a day (BID) | ORAL | 0 refills | Status: AC
  Filled 2023-09-16: qty 5, 3d supply, fill #0

## 2023-09-16 MED ORDER — GUAIFENESIN-DM 100-10 MG/5ML PO SYRP
5.0000 mL | ORAL_SOLUTION | ORAL | 0 refills | Status: DC | PRN
  Filled 2023-09-16: qty 118, 4d supply, fill #0

## 2023-09-16 MED ORDER — PREDNISONE 10 MG PO TABS
ORAL_TABLET | ORAL | 0 refills | Status: DC
  Filled 2023-09-16: qty 20, 8d supply, fill #0

## 2023-09-16 MED ORDER — CEFADROXIL 500 MG PO CAPS
500.0000 mg | ORAL_CAPSULE | Freq: Two times a day (BID) | ORAL | 0 refills | Status: AC
  Filled 2023-09-16: qty 10, 5d supply, fill #0

## 2023-09-16 NOTE — Progress Notes (Signed)
 SATURATION QUALIFICATIONS: (This note is used to comply with regulatory documentation for home oxygen)  Patient Saturations on Room Air at Rest = 82%  Patient Saturations on Room Air while Ambulating = 82%  Patient Saturations on 4 Liters of oxygen while Ambulating = 91%  Please briefly explain why patient needs home oxygen:  Pt unable to maintain SpO2 >90%O2 without 4L O2 via [place].   Andrus Sharp B. Beverely Risen PT, DPT Acute Rehabilitation Services Please use secure chat or  Call Office [phone]

## 2023-09-16 NOTE — Discharge Summary (Signed)
 Physician Discharge Summary  Dana Pruitt ZOX:[id number] DOB: 05-22-1941 DOA: 09/13/2023  PCP: Merri Brunette, MD  Admit date: 09/13/2023 Discharge date: 09/16/2023 Recommendations for Outpatient Follow-up:  Follow up with PCP in 1 weeks-call for appointment Please obtain BMP/CBC in one week  Discharge Dispo: Home w/ HH. Discharge Condition: Stable Code Status:   Code Status: Full Code Diet recommendation:  Diet Order             Diet Heart Room service appropriate? Yes; Fluid consistency: Thin  Diet effective now                    Brief/Interim Summary:  83 y.o. female with medical history significant for nonischemic cardiomyopathy EF 30 to 35%, chronic combined diastolic and systolic CHF, paroxysmal A-fib on Eliquis, ascending aortic aneurysm 4.5 cm, CVA in 2020, COPD on 4 L nasal cannula, hypertension, hyperlipidemia, history of breast cancer status post chemotherapy, radiation, lumpectomy in 2013, recently prescribed Macrobid 100 mg twice daily x 5 days by PCP (09/10/2023) for UTI, recently admitted by cardiology on 08/30/2023 and discharged on 09/02/2023 for chest pain for which she had LHC/RHC (08/31/23), who presents to the ER from home with complaints of shortness of breath and a productive cough with greenish sputum , in the ED workup with CT angio further labs-revealed influenza A with left upper and lower lobe consolidation possible atelectasis or infiltrates, emphysema, admitted oxygen 4 L saturating 93% admitted for acute on chronic hypoxic respiratory failure, pneumonia influenza and COPD exacerbation and elevated troponin flat due to demand ischemia.  Discharge diagnosis:  Principal problem : acute on chronic hypoxic respiratory failure Influenza A infection Superimposed bacterial pneumonia Sepsis POA Acute COPD exacerbation: At baseline on 4 L [place] initially on 6 L in the ED. CTA no PE-, but with focal consolidation L UL, LLL, emphysema, pulmonary artery hypertension.   Overall clinically improved.  Treated with Tamiflu, Yupelri/Brovana/Pulmicort, Solu-Medrol and antibiotics. Seh is improved. We will discharge ehr home today and transition to complete Tamiflu, oral antibiotics and steroid taper and she will resume her home nebulizer and inhalers.  Oxygen has been arranged for discharge.   Elevated troponin: NICM Chronic combined systolic and diastolic CHF with EF 30 to 35%: Suspect demand ischemia due to hypoxia, troponin flat in 50s no anginal symptoms. EKG- NO E/O ischemia Of note she had cardiac cath on 08/31/2023 with patent coronary arteries no significant disease. BNP elevated but euvolemic on exam on admission.  Monitor volume status   Recent UTI: Was placed on antibiotics last Friday. UA is normal.   PAF: In NSR.  Continue home Eliquis and amiodarone    Mild hyponatremia: Resolved.     HLD: Continue Lipitor.   Chronic anemia/IDA: Hb stable 8-9 continue continue iron supplement Recent Labs  Lab 09/09/23 1659 09/13/23 1534 09/13/23 1546 09/14/23 0817  HGB 10.7* 9.9* 9.9*  10.2* 8.5*  HCT 31.8* 29.3* 29.0*  30.0* 24.6*     Chronic anxiety/depression Mood stable, continue Zoloft   Generalized weakness/debility deconditioning: PT OT to continue, advised home health PT    Subjective: Seen and examined overall doing well on home oxygen setting.  Agreeable for discharge home today .eager to go home.  Discharge Exam: Vitals:   09/16/23 0353 09/16/23 0807  BP: (!) 155/89 (!) 152/98  Pulse: 75 83  Resp: 18 16  Temp: 98.3 F (36.8 C) 97.9 F (36.6 C)  SpO2: 100% 96%   General: Pt is alert, awake, not in acute distress  Cardiovascular: RRR, S1/S2 +, no rubs, no gallops Respiratory: CTA bilaterally, no wheezing, no rhonchi Abdominal: Soft, NT, ND, bowel sounds + Extremities: no edema, no cyanosis  Discharge Instructions  Discharge Instructions     Discharge instructions   Complete by: As directed    Please call call MD or  return to ER for similar or worsening recurring problem that brought you to hospital or if any fever,nausea/vomiting,abdominal pain, uncontrolled pain, chest pain,  shortness of breath or any other alarming symptoms.  Please follow-up your doctor as instructed in a week time and call the office for appointment.  Please avoid alcohol, smoking, or any other illicit substance and maintain healthy habits including taking your regular medications as prescribed.  You were cared for by a hospitalist during your hospital stay. If you have any questions about your discharge medications or the care you received while you were in the hospital after you are discharged, you can call the unit and ask to speak with the hospitalist on call if the hospitalist that took care of you is not available.  Once you are discharged, your primary care physician will handle any further medical issues. Please note that NO REFILLS for any discharge medications will be authorized once you are discharged, as it is imperative that you return to your primary care physician (or establish a relationship with a primary care physician if you do not have one) for your aftercare needs so that they can reassess your need for medications and monitor your lab values   Increase activity slowly   Complete by: As directed       Allergies as of 09/16/2023   No Known Allergies      Medication List     TAKE these medications    albuterol 108 (90 Base) MCG/ACT inhaler Commonly known as: ProAir HFA 2 puffs every 4 hours as needed only  if your can't catch your breath   albuterol (2.5 MG/3ML) 0.083% nebulizer solution Commonly known as: PROVENTIL Take 3 mLs (2.5 mg total) by nebulization every 6 (six) hours as needed for wheezing or shortness of breath.   amiodarone 200 MG tablet Commonly known as: PACERONE Take 1 tablet (200 mg total) by mouth daily.   atorvastatin 20 MG tablet Commonly known as: LIPITOR Take 20 mg by mouth  daily.   bisoprolol 5 MG tablet Commonly known as: ZEBETA Take 0.5 tablets (2.5 mg total) by mouth daily.   Breztri Aerosphere 160-9-4.8 MCG/ACT Aero Generic drug: budeson-glycopyrrolate-formoterol INHALE 2 PUFFS INTO THE LUNGS TWICE DAILY   cefadroxil 500 MG capsule Commonly known as: DURICEF Take 1 capsule (500 mg total) by mouth 2 (two) times daily for 5 days.   Eliquis 2.5 MG Tabs tablet Generic drug: apixaban TAKE 1 TABLET(2.5 MG) BY MOUTH TWICE DAILY   ezetimibe 10 MG tablet Commonly known as: ZETIA Take 10 mg by mouth daily.   FeroSul 325 (65 Fe) MG tablet Generic drug: ferrous sulfate Take 325 mg by mouth every other day.   fluticasone 50 MCG/ACT nasal spray Commonly known as: FLONASE SHAKE LIQUID AND USE 2 SPRAYS IN EACH NOSTRIL DAILY   guaiFENesin 600 MG 12 hr tablet Commonly known as: MUCINEX Take 1 tablet (600 mg total) by mouth 2 (two) times daily.   guaiFENesin-dextromethorphan 100-10 MG/5ML syrup Commonly known as: ROBITUSSIN DM Take 5 mLs by mouth every 4 (four) hours as needed for cough.   ipratropium 0.06 % nasal spray Commonly known as: ATROVENT Place 2 sprays into both nostrils  4 (four) times daily.   losartan 25 MG tablet Commonly known as: COZAAR Take 25 mg by mouth daily.   oseltamivir 30 MG capsule Commonly known as: TAMIFLU Take 1 capsule (30 mg total) by mouth 2 (two) times daily for 5 doses.   predniSONE 10 MG tablet Commonly known as: DELTASONE Take by mouth: 4 tabs daily for two days; then 3 tabs daily for two days; then 2 tabs daily for two days; then 1 tab daily for two days, then stop. What changed: additional instructions   sertraline 100 MG tablet Commonly known as: ZOLOFT Take 200 mg by mouth daily.   valACYclovir 1000 MG tablet Commonly known as: VALTREX Take 500 mg by mouth as needed.   Vitamin D3 25 MCG (1000 UT) Caps Take 1,000 Units by mouth daily.        Follow-up Information     [facility]  Follow up.   Specialty: Home Health Services Why: Registered Nurse, Physical and Occupational Therapy-office to call with visit days. Contact information: [address] [place] [address] [phone]         Merri Brunette, MD Follow up in 1 week(s).   Specialty: Internal Medicine Contact information: [address] [place] [address] [place] [address] [phone]         [facility] Follow up.   Why: Oxygen to be delivered to the room Contact information: [address] [place] [address] [phone]                No Known Allergies  The results of significant diagnostics from this hospitalization (including imaging, microbiology, ancillary and laboratory) are listed below for reference.    Microbiology: Recent Results (from the past 240 hours)  Resp panel by RT-PCR (RSV, Flu A&B, Covid) Anterior Nasal Swab     Status: Abnormal   Collection Time: 09/13/23  3:01 PM   Specimen: Anterior Nasal Swab  Result Value Ref Range Status   SARS Coronavirus 2 by RT PCR NEGATIVE NEGATIVE Final   Influenza A by PCR POSITIVE (A) NEGATIVE Final   Influenza B by PCR NEGATIVE NEGATIVE Final    Comment: (NOTE) The Xpert Xpress SARS-CoV-2/FLU/RSV plus assay is intended as an aid in the diagnosis of influenza from Nasopharyngeal swab specimens and should not be used as a sole basis for treatment. Nasal washings and aspirates are unacceptable for Xpert Xpress SARS-CoV-2/FLU/RSV testing.  Fact Sheet for Patients: BloggerCourse.com  Fact Sheet for Healthcare Providers: SeriousBroker.it  This test is not yet approved or cleared by the [place] FDA and has been authorized for detection and/or diagnosis of SARS-CoV-2 by FDA under an Emergency Use Authorization (EUA). This EUA will remain in effect (meaning this test can be used) for the duration of the COVID-19 declaration under Section 564(b)(1)  of the Act, 21 U.S.C. section 360bbb-3(b)(1), unless the authorization is terminated or revoked.     Resp Syncytial Virus by PCR NEGATIVE NEGATIVE Final    Comment: (NOTE) Fact Sheet for Patients: BloggerCourse.com  Fact Sheet for Healthcare Providers: SeriousBroker.it  This test is not yet approved or cleared by the [place] FDA and has been authorized for detection and/or diagnosis of SARS-CoV-2 by FDA under an Emergency Use Authorization (EUA). This EUA will remain in effect (meaning this test can be used) for the duration of the COVID-19 declaration under Section 564(b)(1) of the Act, 21 U.S.C. section 360bbb-3(b)(1), unless the authorization is terminated or revoked.  Performed at [facility], [address], [place], [place] [address]   Blood Culture (routine x 2)     Status: None (Preliminary result)   Collection Time: 09/13/23  3:34 PM   Specimen: BLOOD LEFT ARM  Result Value Ref Range Status   Specimen Description BLOOD LEFT ARM  Final   Special Requests   Final    BOTTLES DRAWN AEROBIC AND ANAEROBIC Blood Culture adequate volume   Culture   Final    NO GROWTH 3 DAYS Performed at [facility], [address], [place], [place] [address]    Report Status PENDING  Incomplete  Blood Culture (routine x 2)     Status: None (Preliminary result)   Collection Time: 09/13/23  3:35 PM   Specimen: BLOOD  Result Value Ref Range Status   Specimen Description BLOOD BLOOD RIGHT ARM  Final   Special Requests   Final    BOTTLES DRAWN AEROBIC AND ANAEROBIC Blood Culture adequate volume   Culture   Final    NO GROWTH 3 DAYS Performed at [facility], [address] [place], [place] [address]    Report Status PENDING  Incomplete  MRSA Next Gen by PCR, Nasal     Status: None   Collection Time: 09/15/23  8:51 AM   Specimen: Nasal Mucosa; Nasal Swab  Result Value Ref Range Status   MRSA by PCR Next Gen NOT  DETECTED NOT DETECTED Final    Comment: (NOTE) The GeneXpert MRSA Assay (FDA approved for NASAL specimens only), is one component of a comprehensive MRSA colonization surveillance program. It is not intended to diagnose MRSA infection nor to guide or monitor treatment for MRSA infections. Test performance is not FDA approved in patients less than 91 years old. Performed at [facility], [address], [place], [place] [address]     Procedures/Studies: CT Angio Chest PE W and/or Wo Contrast Result Date: 09/13/2023 CLINICAL DATA:  Pulmonary embolism suspected, high probability. EXAM: CT ANGIOGRAPHY CHEST WITH CONTRAST TECHNIQUE: Multidetector CT imaging of the chest was performed using the standard protocol during bolus administration of intravenous contrast. Multiplanar CT image reconstructions and MIPs were obtained to evaluate the vascular anatomy. RADIATION DOSE REDUCTION: This exam was performed according to the departmental dose-optimization program which includes automated exposure control, adjustment of the mA and/or kV according to patient size and/or use of iterative reconstruction technique. CONTRAST:  75mL OMNIPAQUE IOHEXOL 350 MG/ML SOLN COMPARISON:  11/04/2022. FINDINGS: Cardiovascular: The heart is enlarged and there is no pericardial effusion. Three-vessel coronary artery calcifications are noted. There is atherosclerotic calcification of the aorta with aneurysmal dilatation of the ascending aorta measuring 4.5 cm. The pulmonary trunk is distended measuring 4.3 cm. No evidence of pulmonary embolism is seen. Mediastinum/Nodes: No enlarged mediastinal, hilar, or axillary lymph nodes. Thyroid gland, trachea, and esophagus demonstrate no significant findings. Lungs/Pleura: Paraseptal and centrilobular emphysematous changes are present in the lungs. Biapical pleural scarring is noted. There is bronchiectasis bilaterally with consolidation in the lingular segment of the left upper lobe  and the medial aspect of the left lower lobe. Fibrotic changes are noted in the anterior aspect of the right upper lobe. No effusion or pneumothorax is seen. A few scattered pulmonary nodules are noted in the right upper lobe measuring up to 3 mm, not significantly changed from the prior exam. No new nodule is seen. Upper Abdomen: Stones are present within the gallbladder. No acute abnormality. Musculoskeletal: Sternotomy wires are noted.  Degenerative changes are present in the thoracic spine. Review of the MIP images confirms the above findings. IMPRESSION: 1. No evidence of pulmonary embolism. 2. Focal consolidation in the left upper and lower lobes, possible atelectasis or infiltrate. 3. Emphysema. 4. Cardiomegaly with coronary artery calcifications. 5. Distended pulmonary trunk which may be associated with pulmonary artery hypertension. 6. Aortic atherosclerosis with aneurysmal dilatation of the ascending aorta measuring 4.5 cm. Ascending thoracic aortic aneurysm. Recommend semi-annual imaging followup by CTA or MRA and referral to cardiothoracic surgery if not already obtained. This recommendation follows 2010 ACCF/AHA/AATS/ACR/ASA/SCA/SCAI/SIR/STS/SVM Guidelines for the Diagnosis and Management of Patients With Thoracic Aortic Disease. Circulation. 2010; 121: Z610-R604. Aortic aneurysm NOS (ICD10-I71.9) Electronically Signed   By: Thornell Sartorius M.D.   On: 09/13/2023 21:12   DG Chest Port 1 View Result Date: 09/13/2023 CLINICAL DATA:  Possible sepsis EXAM: PORTABLE CHEST 1 VIEW COMPARISON:  08/30/2023, 08/16/2023, CT chest 11/04/2022 FINDINGS: Emphysema. Increased interstitial opacities and patchy peripheral left lower lung opacity compared to prior. Sternotomy and valve prosthesis. Stable cardiomediastinal silhouette with aortic atherosclerosis. No pneumothorax IMPRESSION: Emphysema. Increased interstitial opacities and patchy peripheral left lower lung opacity compared to prior, possible pneumonia.  Electronically Signed   By: Jasmine Pang M.D.   On: 09/13/2023 18:06   CARDIAC CATHETERIZATION Result Date: 08/31/2023 1.  Patent coronary arteries with mild irregularities but no stenotic lesions.  Mild ectasia in the proximal RCA and mid circumflex. 2.  Essentially normal intracardiac hemodynamics with a right atrial mean pressure of 4 mmHg, PA pressure of 33/14 mean 21 mmHg, pulmonary wedge pressure of 5 mmHg, and preserved cardiac output of 3.7 L/min Plan: med Rx per inpatient cardiology team. OK to resume apixaban tomorrow am (orders written).   DG Chest Port 1 View Result Date: 08/30/2023 CLINICAL DATA:  Chest pain EXAM: PORTABLE CHEST 1 VIEW COMPARISON:  08/16/2023 FINDINGS: Artifact overlies the chest. Previous median sternotomy and aortic valve replacement. Heart size is normal. Aortic atherosclerotic calcification is seen. There are emphysematous changes of the upper lungs. Scattered areas of pulmonary scarring but no sign of active consolidation, collapse or effusion. Chronic degenerative changes affect the shoulders. IMPRESSION: Previous median sternotomy and aortic valve replacement. Emphysematous changes of the upper lungs. Scattered areas of pulmonary scarring. No active disease. Electronically Signed   By: Paulina Fusi M.D.   On: 08/30/2023 11:21   ECHOCARDIOGRAM COMPLETE Result Date: 08/26/2023    ECHOCARDIOGRAM REPORT   Patient Name:   Dana Pruitt Date of Exam: 08/26/2023 Medical Rec #:  [id number]       Height:       64.0 in Accession #:    [id number]      Weight:       122.1 lb Date of Birth:  1941-05-02      BSA:          1.586 m Patient Age:    82 years        BP:           120/66 mmHg Patient Gender: F               HR:           57 bpm. Exam Location:  [address] Procedure: 2D Echo, Cardiac Doppler and Color Doppler (Both Spectral and Color            Flow Doppler were utilized during procedure). Indications:    Z95.2 S/p AVR  I50.22 CHF  History:        Patient  has prior history of Echocardiogram examinations, most                 recent 12/21/2021. CHF, COPD and Dilated ascending aorta, S/p AVR                 (21mm Edwards inspiris resilia), Arrythmias:Paroxysmal atrial                 fibrillation; Risk Factors:Hypertension, Dyslipidemia and Former                 Smoker.  Sonographer:    Samule Ohm RDCS Referring Phys: [phone] JONATHAN J BERRY IMPRESSIONS  1. Left ventricular ejection fraction, by estimation, is 30 to 35%. The left ventricle has moderately decreased function. The left ventricle demonstrates global hypokinesis with prominent septal-lateral dyssynchrony due to LBBB. There is mild concentric  left ventricular hypertrophy. Left ventricular diastolic parameters are consistent with Grade I diastolic dysfunction (impaired relaxation).  2. Right ventricular systolic function is mildly reduced. The right ventricular size is normal. There is normal pulmonary artery systolic pressure. The estimated right ventricular systolic pressure is 35.9 mmHg.  3. Left atrial size was mildly dilated.  4. The mitral valve is normal in structure. Mild mitral valve regurgitation. No evidence of mitral stenosis.  5. Bioprosthetic aortic valve, normal function. Mean gradient 12 mmHg, no perivalvular leakage.  6. Aortic dilatation noted. There is mild dilatation of the ascending aorta, measuring 43 mm.  7. The inferior vena cava is normal in size with greater than 50% respiratory variability, suggesting right atrial pressure of 3 mmHg. FINDINGS  Left Ventricle: Left ventricular ejection fraction, by estimation, is 30 to 35%. The left ventricle has moderately decreased function. The left ventricle demonstrates global hypokinesis. The left ventricular internal cavity size was normal in size. There is mild concentric left ventricular hypertrophy. Left ventricular diastolic parameters are consistent with Grade I diastolic dysfunction (impaired relaxation). Right Ventricle: The right  ventricular size is normal. No increase in right ventricular wall thickness. Right ventricular systolic function is mildly reduced. There is normal pulmonary artery systolic pressure. The tricuspid regurgitant velocity is 2.87 m/s, and with an assumed right atrial pressure of 3 mmHg, the estimated right ventricular systolic pressure is 35.9 mmHg. Left Atrium: Left atrial size was mildly dilated. Right Atrium: Right atrial size was normal in size. Pericardium: There is no evidence of pericardial effusion. Mitral Valve: The mitral valve is normal in structure. Mild mitral valve regurgitation. No evidence of mitral valve stenosis. Tricuspid Valve: The tricuspid valve is normal in structure. Tricuspid valve regurgitation is mild. Aortic Valve: Bioprosthetic aortic valve. Mean gradient 12 mmHg, no perivalvular leakage. The aortic valve has been repaired/replaced. Aortic valve regurgitation is not visualized. Aortic valve mean gradient measures 12.0 mmHg. Aortic valve peak gradient  measures 22.6 mmHg. Aortic valve area, by VTI measures 0.72 cm. Pulmonic Valve: The pulmonic valve was normal in structure. Pulmonic valve regurgitation is trivial. Aorta: The aortic root is normal in size and structure and aortic dilatation noted. There is mild dilatation of the ascending aorta, measuring 43 mm. Venous: The inferior vena cava is normal in size with greater than 50% respiratory variability, suggesting right atrial pressure of 3 mmHg. IAS/Shunts: No atrial level shunt detected by color flow Doppler.  LEFT VENTRICLE PLAX 2D LVIDd:         4.20 cm   Diastology LVIDs:  3.10 cm   LV e' medial:    3.37 cm/s LV PW:         1.10 cm   LV E/e' medial:  12.9 LV IVS:        1.60 cm   LV e' lateral:   5.44 cm/s LVOT diam:     1.90 cm   LV E/e' lateral: 8.0 LV SV:         36 LV SV Index:   23 LVOT Area:     2.84 cm  RIGHT VENTRICLE            IVC RV S prime:     8.05 cm/s  IVC diam: 1.30 cm TAPSE (M-mode): 1.4 cm RVSP:            35.9 mmHg LEFT ATRIUM             Index        RIGHT ATRIUM           Index LA diam:        4.00 cm 2.52 cm/m   RA Pressure: 3.00 mmHg LA Vol (A2C):   47.9 ml 30.19 ml/m  RA Area:     13.00 cm LA Vol (A4C):   37.9 ml 23.89 ml/m  RA Volume:   29.00 ml  18.28 ml/m LA Biplane Vol: 44.0 ml 27.74 ml/m  AORTIC VALVE AV Area (Vmax):    0.72 cm AV Area (Vmean):   0.79 cm AV Area (VTI):     0.72 cm AV Vmax:           237.50 cm/s AV Vmean:          158.000 cm/s AV VTI:            0.498 m AV Peak Grad:      22.6 mmHg AV Mean Grad:      12.0 mmHg LVOT Vmax:         59.90 cm/s LVOT Vmean:        44.300 cm/s LVOT VTI:          0.126 m LVOT/AV VTI ratio: 0.25  AORTA Ao Root diam: 3.60 cm Ao Asc diam:  4.30 cm MITRAL VALVE               TRICUSPID VALVE MV Area (PHT): 1.91 cm    TR Peak grad:   32.9 mmHg MV Decel Time: 398 msec    TR Vmax:        287.00 cm/s MV E velocity: 43.50 cm/s  Estimated RAP:  3.00 mmHg MV A velocity: 61.80 cm/s  RVSP:           35.9 mmHg MV E/A ratio:  0.70                            SHUNTS                            Systemic VTI:  0.13 m                            Systemic Diam: 1.90 cm Dalton McleanMD Electronically signed by Wilfred Lacy Signature Date/Time: 08/26/2023/2:59:42 PM    Final     Labs: BNP (last 3 results) Recent Labs    09/13/23 1534  BNP 905.0*   Basic Metabolic Panel: Recent Labs  Lab 09/09/23 1659  09/13/23 1534 09/13/23 1546 09/14/23 0817  NA 136 130* 131*  131* 135  K 4.0 3.4* 3.5  3.5 3.5  CL 99 96* 98 100  CO2 22 23  --  25  GLUCOSE 116* 137* 137* 132*  BUN 22 14 13 18   CREATININE 1.23* 0.93 0.80 1.00  CALCIUM 9.0 9.2  --  8.6*  MG  --   --   --  1.7  PHOS  --   --   --  2.9   Liver Function Tests: Recent Labs  Lab 09/09/23 1659 09/13/23 1534  AST 30 25  ALT 30 28  ALKPHOS 43* 43  BILITOT 0.2 1.1  PROT 5.8* 6.5  ALBUMIN 4.2 3.2*   Recent Labs  Lab 09/13/23 1534  LIPASE 26   No results for input(s): "AMMONIA" in the last 168  hours. CBC: Recent Labs  Lab 09/09/23 1659 09/13/23 1534 09/13/23 1546 09/14/23 0817  WBC 4.4 9.1  --  7.8  NEUTROABS  --  8.4*  --   --   HGB 10.7* 9.9* 9.9*  10.2* 8.5*  HCT 31.8* 29.3* 29.0*  30.0* 24.6*  MCV 97 94.5  --  93.5  PLT 144* 277  --  290  Urinalysis    Component Value Date/Time   COLORURINE YELLOW 09/13/2023 1501   APPEARANCEUR CLEAR 09/13/2023 1501   LABSPEC 1.012 09/13/2023 1501   PHURINE 5.0 09/13/2023 1501   GLUCOSEU NEGATIVE 09/13/2023 1501   HGBUR SMALL (A) 09/13/2023 1501   BILIRUBINUR NEGATIVE 09/13/2023 1501   KETONESUR 20 (A) 09/13/2023 1501   PROTEINUR NEGATIVE 09/13/2023 1501   UROBILINOGEN 0.2 10/17/2012 1738   NITRITE NEGATIVE 09/13/2023 1501   LEUKOCYTESUR NEGATIVE 09/13/2023 1501   Sepsis Labs Recent Labs  Lab 09/09/23 1659 09/13/23 1534 09/14/23 0817  WBC 4.4 9.1 7.8   Microbiology Recent Results (from the past 240 hours)  Resp panel by RT-PCR (RSV, Flu A&B, Covid) Anterior Nasal Swab     Status: Abnormal   Collection Time: 09/13/23  3:01 PM   Specimen: Anterior Nasal Swab  Result Value Ref Range Status   SARS Coronavirus 2 by RT PCR NEGATIVE NEGATIVE Final   Influenza A by PCR POSITIVE (A) NEGATIVE Final   Influenza B by PCR NEGATIVE NEGATIVE Final    Comment: (NOTE) The Xpert Xpress SARS-CoV-2/FLU/RSV plus assay is intended as an aid in the diagnosis of influenza from Nasopharyngeal swab specimens and should not be used as a sole basis for treatment. Nasal washings and aspirates are unacceptable for Xpert Xpress SARS-CoV-2/FLU/RSV testing.  Fact Sheet for Patients: BloggerCourse.com  Fact Sheet for Healthcare Providers: SeriousBroker.it  This test is not yet approved or cleared by the [place] FDA and has been authorized for detection and/or diagnosis of SARS-CoV-2 by FDA under an Emergency Use Authorization (EUA). This EUA will remain in effect (meaning this test  can be used) for the duration of the COVID-19 declaration under Section 564(b)(1) of the Act, 21 U.S.C. section 360bbb-3(b)(1), unless the authorization is terminated or revoked.     Resp Syncytial Virus by PCR NEGATIVE NEGATIVE Final    Comment: (NOTE) Fact Sheet for Patients: BloggerCourse.com  Fact Sheet for Healthcare Providers: SeriousBroker.it  This test is not yet approved or cleared by the [place] FDA and has been authorized for detection and/or diagnosis of SARS-CoV-2 by FDA under an Emergency Use Authorization (EUA). This EUA will remain in effect (meaning this test can be used) for the duration of the COVID-19 declaration  under Section 564(b)(1) of the Act, 21 U.S.C. section 360bbb-3(b)(1), unless the authorization is terminated or revoked.  Performed at [facility], [address] [place], [place] [address]   Blood Culture (routine x 2)     Status: None (Preliminary result)   Collection Time: 09/13/23  3:34 PM   Specimen: BLOOD LEFT ARM  Result Value Ref Range Status   Specimen Description BLOOD LEFT ARM  Final   Special Requests   Final    BOTTLES DRAWN AEROBIC AND ANAEROBIC Blood Culture adequate volume   Culture   Final    NO GROWTH 3 DAYS Performed at [facility], [address] [place], [place] [address]    Report Status PENDING  Incomplete  Blood Culture (routine x 2)     Status: None (Preliminary result)   Collection Time: 09/13/23  3:35 PM   Specimen: BLOOD  Result Value Ref Range Status   Specimen Description BLOOD BLOOD RIGHT ARM  Final   Special Requests   Final    BOTTLES DRAWN AEROBIC AND ANAEROBIC Blood Culture adequate volume   Culture   Final    NO GROWTH 3 DAYS Performed at [facility], [address] [place], [place] [address]    Report Status PENDING  Incomplete  MRSA Next Gen by PCR, Nasal     Status: None   Collection Time: 09/15/23  8:51 AM   Specimen:  Nasal Mucosa; Nasal Swab  Result Value Ref Range Status   MRSA by PCR Next Gen NOT DETECTED NOT DETECTED Final    Comment: (NOTE) The GeneXpert MRSA Assay (FDA approved for NASAL specimens only), is one component of a comprehensive MRSA colonization surveillance program. It is not intended to diagnose MRSA infection nor to guide or monitor treatment for MRSA infections. Test performance is not FDA approved in patients less than 82 years old. Performed at [facility], [address], [place], [place] [address]    Time coordinating discharge: 35 minutes  SIGNED: Lanae Boast, MD  [facility] 09/16/2023, 12:48 PM  If 7PM-7AM, please contact night-coverage [email or web address]

## 2023-09-16 NOTE — Progress Notes (Addendum)
 Pt ambulated ~600 feet with a rolling walker on 4L/[place].  Sats maintained 86-96% while ambulating.  Dana Pruitt

## 2023-09-16 NOTE — Plan of Care (Signed)
   Problem: Education: Goal: Knowledge of General Education information will improve Description Including pain rating scale, medication(s)/side effects and non-pharmacologic comfort measures Outcome: Progressing   Problem: Health Behavior/Discharge Planning: Goal: Ability to manage health-related needs will improve Outcome: Progressing

## 2023-09-16 NOTE — Care Management Important Message (Signed)
 Important Message  Patient Details  Name: Dana Pruitt MRN: [id number] Date of Birth: 1941/02/22   Important Message Given:  Yes - Medicare IM     Renie Ora 09/16/2023, 9:37 AM

## 2023-09-16 NOTE — Consult Note (Signed)
 Value-Based [facility] Liaison Consult Note    09/16/2023  KENNISHA QIN October 12, 1940 [id number]  Insurance: Valinda Hoar [facility] [employer] Medicare  Primary Care Provider: Merri Brunette, MD, with [facility], this provider is listed for the transition of care follow up appointments  and [facility] calls   [facility] Liaison screened the patient remotely at [facility].  Spoke with patient via bedside phone, HIPAA verified.  Explained regarding post hospital follow up calls anticipated.  Patient verbalized concerns for needing HHRN and noted the request with [facility] HH explained. Patient also wondered if an aide would be covered.    The patient was screened for 30 day readmission hospitalization with noted high risk score for unplanned readmission risk 2 hospital admissions in 6 months.  The patient was assessed for potential [facility] Coordination service needs for post hospital transition for care coordination. Review of patient's electronic medical record reveals patient is for home with [facility] noted.  Collaborated with Inpatient TOC RN on request.  Plan: [facility] Liaison will continue to follow progress and disposition to assess for post hospital community care coordination/management needs.  Referral request for community care coordination: Patient felt if [facility] is set up she will not need care coordination but willing to have TOC calls at this time.   [facility] Community Care, Population Health does not replace or interfere with any arrangements made by the Inpatient Transition of Care team.   For questions contact:   Charlesetta Shanks, RN, BSN, CCM [place]  [facility], [facility] Liaison Direct Dial: [phone] or secure chat Email: [email or web address]

## 2023-09-16 NOTE — Progress Notes (Signed)
 Physical Therapy Treatment Patient Details Name: Dana Pruitt MRN: [id number] DOB: 23-May-1941 Today's Date: 09/16/2023   History of Present Illness Dana Pruitt is a 83 y.o. female with medical history significant for nonischemic cardiomyopathy EF 30 to 35%, chronic combined diastolic and systolic CHF, paroxysmal A-fib on Eliquis, ascending aortic aneurysm 4.5 cm, CVA in 2020, COPD on 4 L nasal cannula, hypertension, hyperlipidemia, history of breast cancer status post chemotherapy, radiation, lumpectomy in 2013, recently prescribed Macrobid 100 mg twice daily x 5 days by PCP (09/10/2023) for UTI, recently admitted by cardiology on 08/30/2023 and discharged on 09/02/2023 for chest pain for which she had LHC/RHC (08/31/23), who presents to the ER from home 09/13/23 with complaints of shortness of breath and a productive cough with greenish sputum .  Associated with subjective fevers and generalized weakness.  Symptoms started sometimes after her discharge from the hospital.  Presents to the ER for further evaluation. Found to be Flu +    PT Comments  Pt getting ready to go home, focus of session, determining supplemental oxygen needs and working on energy conservation. Pt is currently independent with bed mobility, and contact guard for transfers and ambulation in hallway. Pt requires 4L via [place] to maintain SpO2 >90%O2 with ambulation. Pt able to take short standing rest break and recover breath with purse lip breathing. Plan for discharge home today.     If plan is discharge home, recommend the following: A little help with walking and/or transfers;A little help with bathing/dressing/bathroom;Assistance with cooking/housework;Assist for transportation;Help with stairs or ramp for entrance     Equipment Recommendations  Rolling walker (2 wheels)       Precautions / Restrictions Precautions Precautions: Fall Recall of Precautions/Restrictions: Intact Precaution/Restrictions Comments: monitor  O2 Restrictions Weight Bearing Restrictions Per Provider Order: No     Mobility  Bed Mobility Overal bed mobility: Independent             General bed mobility comments: pt able to transfer supine to EOB    Transfers Overall transfer level: Needs assistance Equipment used: Rolling walker (2 wheels) Transfers: Sit to/from Stand, Bed to chair/wheelchair/BSC Sit to Stand: Contact guard assist   Step pivot transfers: Contact guard assist       General transfer comment: CGA for safety, balance maintained    Ambulation/Gait Ambulation/Gait assistance: Contact guard assist   Assistive device: Rolling walker (2 wheels) Gait Pattern/deviations: Step-through pattern, Decreased step length - right, Decreased step length - left, Decreased stride length Gait velocity: decreased     General Gait Details: hesitant gait. Slow pace. Fearful of passing out like she did last admission       Balance Overall balance assessment: Needs assistance Sitting-balance support: Feet supported, No upper extremity supported Sitting balance-Leahy Scale: Normal Sitting balance - Comments: No LOB sitting EOB   Standing balance support: Bilateral upper extremity supported, During functional activity, Reliant on assistive device for balance Standing balance-Leahy Scale: Fair Standing balance comment: able to stand well however reliant on RW for support                            Communication Communication Communication: No apparent difficulties  Cognition Arousal: Alert Behavior During Therapy: WFL for tasks assessed/performed   PT - Cognitive impairments: No apparent impairments                         Following commands: Intact  Cueing Cueing Techniques: Verbal cues         Pertinent Vitals/Pain Pain Assessment Pain Assessment: No/denies pain     PT Goals (current goals can now be found in the care plan section) Acute Rehab PT Goals Patient Stated  Goal: return home, get some assistance PT Goal Formulation: With patient Time For Goal Achievement: 09/20/23 Potential to Achieve Goals: Good Progress towards PT goals: Progressing toward goals    Frequency    Min 2X/week       AM-PAC PT "6 Clicks" Mobility   Outcome Measure  Help needed turning from your back to your side while in a flat bed without using bedrails?: None Help needed moving from lying on your back to sitting on the side of a flat bed without using bedrails?: None Help needed moving to and from a bed to a chair (including a wheelchair)?: A Little Help needed standing up from a chair using your arms (e.g., wheelchair or bedside chair)?: A Little Help needed to walk in hospital room?: A Little Help needed climbing 3-5 steps with a railing? : A Lot 6 Click Score: 19    End of Session Equipment Utilized During Treatment: Oxygen Activity Tolerance: Patient limited by fatigue Patient left: in chair;with call bell/phone within reach;with chair alarm set Nurse Communication: Mobility status PT Visit Diagnosis: Muscle weakness (generalized) (M62.81);Difficulty in walking, not elsewhere classified (R26.2);History of falling (Z91.81)     Time: 1610-9604 PT Time Calculation (min) (ACUTE ONLY): 31 min  Charges:    $Gait Training: 8-22 mins PT General Charges $$ ACUTE PT VISIT: 1 Visit                     Dana Pruitt PT, DPT Acute Rehabilitation Services Please use secure chat or  Call Office [phone]    Dana Pruitt [facility] 09/16/2023, 3:33 PM

## 2023-09-16 NOTE — TOC Transition Note (Signed)
 Transition of Care [facility]) - Discharge Note   Patient Details  Name: Dana Pruitt MRN: [id number] Date of Birth: [birth year]  [facility]) CM/SW Contact:  Gala Lewandowsky, RN Phone Number: 09/16/2023, 11:34 AM   Clinical Narrative: Patient will transition home today. Case Manager finally was able to find the DME oxygen agency that the patient uses. Patient is active with [facility] off [address] Patient states she does not want to continue with this agency because she has not ben able to speak with anyone regarding her needs. Case Manager spoke with Crystal at [facility] and she is aware that the patient will go with [facility]- Referral made to [employer] and he will deliver portable tanks to the room before she leaves. Staff to get an ambulatory sat and document for [facility]. MD to place order for Oxygen conserving device "OCD titration to keep sats above 92%, CHF diagnosis". [facility] can then have respiratory visit the home and complete the titration. Patient will call [facility] @ [phone] when she leaves so that the concentrator will be delivered to the home. [facility] is aware that the patient will return home today with RN/PT/OT/Aide. No further needs identified at this time. Patient states she has a friend that will provide transportation.   Final next level of care: Home w Home Health Services Barriers to Discharge: No Barriers Identified   Patient Goals and CMS Choice Patient states their goals for this hospitalization and ongoing recovery are:: plan to return home.  Discharge Plan and Services Additional resources added to the After Visit Summary for     Discharge Planning Services: CM Consult Post Acute Care Choice: Durable Medical Equipment, Home Health          DME Arranged: Oxygen DME Agency: Patsy Lager Date DME Agency Contacted: 09/16/23 Time DME Agency Contacted: 1132 Representative spoke with at DME Agency: Morrie Sheldon HH Arranged: RN, Disease Management,  PT, OT, Nurse's Aide HH Agency: [facility] Date [facility] Agency Contacted: 09/14/23 Time HH Agency Contacted: 1302 Representative spoke with at [facility] Agency: Haywood Lasso  Social Drivers of Health (SDOH) Interventions SDOH Screenings   Food Insecurity: No Food Insecurity (09/14/2023)  Housing: Low Risk  (09/14/2023)  Transportation Needs: No Transportation Needs (09/14/2023)  Utilities: Not At Risk (09/14/2023)  Depression (PHQ2-9): Medium Risk (07/26/2023)  Social Connections: Moderately Integrated (09/14/2023)  Tobacco Use: Medium Risk (09/13/2023)   Readmission Risk Interventions    09/14/2023    1:01 PM  Readmission Risk Prevention Plan  Transportation Screening Complete  HRI or Home Care Consult Complete  Social Work Consult for Recovery Care Planning/Counseling Complete  Palliative Care Screening Not Applicable  Medication Review Oceanographer) Referral to Pharmacy

## 2023-09-16 NOTE — Progress Notes (Signed)
 Mobility Specialist Progress Note;   09/16/23 0930  Mobility  Activity Transferred from bed to chair  Level of Assistance Contact guard assist, steadying assist  Assistive Device Front wheel walker  Distance Ambulated (ft) 5 ft  Activity Response Tolerated well  Mobility Referral Yes  Mobility visit 1 Mobility  Mobility Specialist Start Time (ACUTE ONLY) 0930  Mobility Specialist Stop Time (ACUTE ONLY) 0942  Mobility Specialist Time Calculation (min) (ACUTE ONLY) 12 min   Pt agreeable to mobility. Required MinG assistance to safely transfer pt form bed to chair. VSS on 3.5LO2 and no c/o of SOB when asked. Pt c/o feeling tired, however stated sitting up felt better. Pt left in chair with all needs met, alarm on.   Caesar Bookman Mobility Specialist Please contact via SecureChat or [employer] [phone]

## 2023-09-17 ENCOUNTER — Telehealth: Payer: Self-pay

## 2023-09-17 DIAGNOSIS — I509 Heart failure, unspecified: Secondary | ICD-10-CM | POA: Diagnosis not present

## 2023-09-17 NOTE — Transitions of Care (Post Inpatient/ED Visit) (Addendum)
 09/17/2023  Name: Dana Pruitt MRN: [id number] DOB: Dec 29, 1940  Today's TOC FU Call Status: Today's TOC FU Call Status:: Successful TOC FU Call Completed TOC FU Call Complete Date: 09/17/23 Patient's Name and Date of Birth confirmed.  Transition Care Management Follow-up Telephone Call Date of Discharge: 09/16/23 Discharge Facility: [facility]) Type of Discharge: Inpatient Admission Primary Inpatient Discharge Diagnosis:: Flu, Pneumonia, COPD How have you been since you were released from the hospital?: Better (I didn't realize I had Pneumonia)  Items Reviewed: Did you receive and understand the discharge instructions provided?: Yes Medications obtained,verified, and reconciled?: Yes (Medications Reviewed) Any new allergies since your discharge?: No Dietary orders reviewed?: Yes Type of Diet Ordered:: Heart Healthy Do you have support at home?: Yes People in Home: spouse Name of Support/Comfort Primary Source: Teva Bronkema  Medications Reviewed Today: Medications Reviewed Today     Reviewed by Redge Gainer, RN (Case Manager) on 09/17/23 at 1358  Med List Status: <None>   Medication Order Taking? Sig Documenting Provider Last Dose Status Informant  albuterol (PROAIR HFA) 108 (90 Base) MCG/ACT inhaler [id number]  2 puffs every 4 hours as needed only  if your can't catch your breath Nyoka Cowden, MD  Active Self, Pharmacy Records  albuterol (PROVENTIL) (2.5 MG/3ML) 0.083% nebulizer solution [id number]  Take 3 mLs (2.5 mg total) by nebulization every 6 (six) hours as needed for wheezing or shortness of breath. Noemi Chapel, NP  Active Self, Pharmacy Records           Med Note Neil Crouch, SEBASTIAN   Mon Sep 13, 2023  9:55 PM)    amiodarone (PACERONE) 200 MG tablet [id number]  Take 1 tablet (200 mg total) by mouth daily. Jeronimo Greaves  Active Self, Pharmacy Records           Med Note Neil Crouch, SEBASTIAN   Mon Sep 13, 2023  9:57 PM)    atorvastatin (LIPITOR)  20 MG tablet [id number]  Take 20 mg by mouth daily.  [provider]  Active Self, Pharmacy Records           Med Note (COX, HEATHER C   Mon Mar 08, 2017  2:22 PM)    bisoprolol (ZEBETA) 5 MG tablet [id number]  Take 0.5 tablets (2.5 mg total) by mouth daily.  Patient not taking: Reported on 09/13/2023   Olena Leatherwood, PA-C  Active Self, Pharmacy Records           Med Note Rayburn Felt Sep 13, 2023  9:58 PM) On hold by provider Nicanor Alcon AEROSPHERE 160-9-4.8 MCG/ACT AERO [id number]  INHALE 2 PUFFS INTO THE LUNGS TWICE DAILY Nyoka Cowden, MD  Active Self, Pharmacy Records  cefadroxil (DURICEF) 500 MG capsule [id number]  Take 1 capsule (500 mg total) by mouth 2 (two) times daily for 5 days. Lanae Boast, MD  Active   Cholecalciferol (VITAMIN D3) 25 MCG (1000 UT) CAPS [id number]  Take 1,000 Units by mouth daily. [provider]  Active Self, Pharmacy Records  ELIQUIS 2.5 MG TABS tablet [id number]  TAKE 1 TABLET(2.5 MG) BY MOUTH TWICE DAILY Runell Gess, MD  Active Self, Pharmacy Records  ezetimibe (ZETIA) 10 MG tablet [id number]  Take 10 mg by mouth daily. [provider]  Active Self, Pharmacy Records  FEROSUL 325 (65 Fe) MG tablet [id number]  Take 325 mg by mouth every other day. [provider]  Active Self, Pharmacy Records  fluticasone [facility]) 50 MCG/ACT nasal  spray [id number]  SHAKE LIQUID AND USE 2 SPRAYS IN EACH NOSTRIL DAILY Nyoka Cowden, MD  Active Self, Pharmacy Records  guaiFENesin (MUCINEX) 600 MG 12 hr tablet [id number]  Take 1 tablet (600 mg total) by mouth 2 (two) times daily. Rowe Clack, PA-C  Active Self, Pharmacy Records  guaiFENesin-dextromethorphan [facility]) 100-10 MG/5ML syrup [id number]  Take 5 mLs by mouth every 4 (four) hours as needed for cough. Lanae Boast, MD  Active   ipratropium (ATROVENT) 0.06 % nasal spray [id number]  Place 2 sprays into both nostrils 4 (four) times daily. Noemi Chapel, NP  Active Self,  Pharmacy Records  losartan (COZAAR) 25 MG tablet [id number]  Take 25 mg by mouth daily.  Patient not taking: Reported on 09/13/2023   [provider]  Active Self, Pharmacy Records           Med Note Neil Crouch, SEBASTIAN   Mon Sep 13, 2023  9:59 PM) On hold by provider Irene Limbo   oseltamivir (TAMIFLU) 30 MG capsule [id number]  Take 1 capsule (30 mg total) by mouth 2 (two) times daily for 5 doses. Lanae Boast, MD  Active   OXYGEN [id number] Yes Inhale 4 L/min into the lungs continuous. [provider]  Active   predniSONE (DELTASONE) 10 MG tablet [id number]  Take by mouth: 4 tabs daily for two days; then 3 tabs daily for two days; then 2 tabs daily for two days; then 1 tab daily for two days, then stop. Lanae Boast, MD  Active   sertraline (ZOLOFT) 100 MG tablet [id number]  Take 200 mg by mouth daily. [provider]  Active Self, Pharmacy Records  valACYclovir (VALTREX) 1000 MG tablet [id number]  Take 500 mg by mouth as needed. [provider]  Active Self, Pharmacy Records           Med Note [facility], SEBASTIAN   Mon Sep 13, 2023 10:00 PM) PRN, no recent need/use            Home Care and Equipment/Supplies: Were Home Health Services Ordered?: Yes Name of Home Health Agency:: [facility] Has Agency set up a time to come to your home?: Yes First Home Health Visit Date: 09/18/23 Any new equipment or medical supplies ordered?: No  Functional Questionnaire: Do you need assistance with bathing/showering or dressing?: No Do you need assistance with meal preparation?: No Do you need assistance with eating?: No Do you have difficulty maintaining continence: No Do you need assistance with getting out of bed/getting out of a chair/moving?: No Do you have difficulty managing or taking your medications?: No  Follow up appointments reviewed: PCP Follow-up appointment confirmed?: Yes Date of PCP follow-up appointment?: 09/20/23 Follow-up Provider: Merri Brunette Specialist  [facility] Follow-up appointment confirmed?: Yes Date of Specialist follow-up appointment?: 09/22/23 Follow-Up Specialty Provider:: Dr. Allyson Sabal Do you need transportation to your follow-up appointment?: No Do you understand care options if your condition(s) worsen?: Yes-patient verbalized understanding  SDOH Interventions Today    Flowsheet Row Most Recent Value  SDOH Interventions   Food Insecurity Interventions Intervention Not Indicated  Housing Interventions Intervention Not Indicated  Transportation Interventions Intervention Not Indicated  Utilities Interventions Intervention Not Indicated       Goals Addressed             This Visit's Progress    [facility] Outreach Care Plan       Current Barriers:  Chronic Disease Management support and education needs related to COPD   RNCM Clinical Goal(s):  Patient  will work with the Care Management team over the next 30 days to address Transition of Care Barriers: Medication access Medication Management Diet/Nutrition/Food Resources Support at home Provider appointments Home Health services Equipment/DME verbalize understanding of plan for management of COPD as evidenced by EMR, verbal teachback attend all scheduled medical appointments: The patient has many specialist to follow up  as evidenced by EMR  through collaboration with RN Care manager, provider, and care team.   Interventions: Evaluation of current treatment plan related to  self management and patient's adherence to plan as established by provider   COPD Interventions:  (Status:  New goal.) Short Term Goal Provided patient with basic written and verbal COPD education on self care/management/and exacerbation prevention Advised patient to track and manage COPD triggers Advised patient to engage in light exercise as tolerated 3-5 days a week to aid in the the management of COPD Provided education about and advised patient to utilize infection prevention strategies to reduce  risk of respiratory infection Discussed the importance of adequate rest and management of fatigue with COPD Wear Oxygen continuous at 4l/min Maintain oxygen saturation above 91% Utilize nebulizer and inhalers as instructed Practice energy conservation  Patient Goals/Self-Care Activities: Participate in Transition of Care Program/Attend TOC scheduled calls Notify RN Care Manager of TOC call rescheduling needs Take all medications as prescribed Attend all scheduled provider appointments Call pharmacy for medication refills 3-7 days in advance of running out of medications Perform all self care activities independently  do breathing exercises every day develop a rescue plan eliminate symptom triggers at home use devices that will help like a cane, sock-puller or reacher  Follow Up Plan:  Telephone follow up appointment with care management team member scheduled for:  Thursday April 3rd at 12noon         [facility] Outreach completed today. The patient was in the hospital for Flu, Pneumonia and COPD exacerbation. She is at home and wearing her oxygen at 4l/min. Her O2 sat is 93%. She switched her oxygen company to [place] and is waiting for the company to come and switch out her current equipment. She states she has heard from the [facility] team and services will start tomorrow 09/18/23. The patient has scheduled her PCP follow up visit and her Pulmonary visit. She has consented to the 30 Day [facility] Outreach program.   Routine follow-up and on-going assessment evaluation and education of disease processes, and recommended interventions for both chronic and acute medical conditions, will occur during each weekly visit during [facility] 30-day Program Outreach calls along with ongoing review of symptoms, medication reviews and reconciliation. Any updates, inconsistencies, discrepancies or acute care concerns will be addressed on the Care Plan and routed to the correct Practitioner if indicated.    The patient  has been provided with contact information for the care management team and has been advised to call with any health-related questions or concerns. The patient verbalized understanding with current POC. The patient is directed to their insurance card regarding availability of benefits coverage.  Deidre Ala, BSN, RN [place]  VBCI - [employer] [facility] RN Care Manager [phone]

## 2023-09-18 DIAGNOSIS — I5042 Chronic combined systolic (congestive) and diastolic (congestive) heart failure: Secondary | ICD-10-CM | POA: Diagnosis not present

## 2023-09-18 DIAGNOSIS — J309 Allergic rhinitis, unspecified: Secondary | ICD-10-CM | POA: Diagnosis not present

## 2023-09-18 DIAGNOSIS — I428 Other cardiomyopathies: Secondary | ICD-10-CM | POA: Diagnosis not present

## 2023-09-18 DIAGNOSIS — J441 Chronic obstructive pulmonary disease with (acute) exacerbation: Secondary | ICD-10-CM | POA: Diagnosis not present

## 2023-09-18 DIAGNOSIS — I110 Hypertensive heart disease with heart failure: Secondary | ICD-10-CM | POA: Diagnosis not present

## 2023-09-18 DIAGNOSIS — J9621 Acute and chronic respiratory failure with hypoxia: Secondary | ICD-10-CM | POA: Diagnosis not present

## 2023-09-18 DIAGNOSIS — I480 Paroxysmal atrial fibrillation: Secondary | ICD-10-CM | POA: Diagnosis not present

## 2023-09-18 DIAGNOSIS — M8580 Other specified disorders of bone density and structure, unspecified site: Secondary | ICD-10-CM | POA: Diagnosis not present

## 2023-09-18 DIAGNOSIS — E785 Hyperlipidemia, unspecified: Secondary | ICD-10-CM | POA: Diagnosis not present

## 2023-09-18 DIAGNOSIS — I081 Rheumatic disorders of both mitral and tricuspid valves: Secondary | ICD-10-CM | POA: Diagnosis not present

## 2023-09-18 DIAGNOSIS — I447 Left bundle-branch block, unspecified: Secondary | ICD-10-CM | POA: Diagnosis not present

## 2023-09-18 DIAGNOSIS — I2510 Atherosclerotic heart disease of native coronary artery without angina pectoris: Secondary | ICD-10-CM | POA: Diagnosis not present

## 2023-09-18 DIAGNOSIS — H34212 Partial retinal artery occlusion, left eye: Secondary | ICD-10-CM | POA: Diagnosis not present

## 2023-09-18 DIAGNOSIS — Z7951 Long term (current) use of inhaled steroids: Secondary | ICD-10-CM | POA: Diagnosis not present

## 2023-09-18 DIAGNOSIS — Z7901 Long term (current) use of anticoagulants: Secondary | ICD-10-CM | POA: Diagnosis not present

## 2023-09-18 DIAGNOSIS — I712 Thoracic aortic aneurysm, without rupture, unspecified: Secondary | ICD-10-CM | POA: Diagnosis not present

## 2023-09-18 LAB — CULTURE, BLOOD (ROUTINE X 2)
Culture: NO GROWTH
Culture: NO GROWTH
Special Requests: ADEQUATE
Special Requests: ADEQUATE

## 2023-09-20 DIAGNOSIS — Z7901 Long term (current) use of anticoagulants: Secondary | ICD-10-CM | POA: Diagnosis not present

## 2023-09-20 DIAGNOSIS — J1100 Influenza due to unidentified influenza virus with unspecified type of pneumonia: Secondary | ICD-10-CM | POA: Diagnosis not present

## 2023-09-20 DIAGNOSIS — I480 Paroxysmal atrial fibrillation: Secondary | ICD-10-CM | POA: Diagnosis not present

## 2023-09-20 DIAGNOSIS — I428 Other cardiomyopathies: Secondary | ICD-10-CM | POA: Diagnosis not present

## 2023-09-20 DIAGNOSIS — E871 Hypo-osmolality and hyponatremia: Secondary | ICD-10-CM | POA: Diagnosis not present

## 2023-09-20 DIAGNOSIS — J309 Allergic rhinitis, unspecified: Secondary | ICD-10-CM | POA: Diagnosis not present

## 2023-09-20 DIAGNOSIS — M8580 Other specified disorders of bone density and structure, unspecified site: Secondary | ICD-10-CM | POA: Diagnosis not present

## 2023-09-20 DIAGNOSIS — J9621 Acute and chronic respiratory failure with hypoxia: Secondary | ICD-10-CM | POA: Diagnosis not present

## 2023-09-20 DIAGNOSIS — I712 Thoracic aortic aneurysm, without rupture, unspecified: Secondary | ICD-10-CM | POA: Diagnosis not present

## 2023-09-20 DIAGNOSIS — J441 Chronic obstructive pulmonary disease with (acute) exacerbation: Secondary | ICD-10-CM | POA: Diagnosis not present

## 2023-09-20 DIAGNOSIS — D649 Anemia, unspecified: Secondary | ICD-10-CM | POA: Diagnosis not present

## 2023-09-20 DIAGNOSIS — I2510 Atherosclerotic heart disease of native coronary artery without angina pectoris: Secondary | ICD-10-CM | POA: Diagnosis not present

## 2023-09-20 DIAGNOSIS — I447 Left bundle-branch block, unspecified: Secondary | ICD-10-CM | POA: Diagnosis not present

## 2023-09-20 DIAGNOSIS — I110 Hypertensive heart disease with heart failure: Secondary | ICD-10-CM | POA: Diagnosis not present

## 2023-09-20 DIAGNOSIS — I081 Rheumatic disorders of both mitral and tricuspid valves: Secondary | ICD-10-CM | POA: Diagnosis not present

## 2023-09-20 DIAGNOSIS — I5042 Chronic combined systolic (congestive) and diastolic (congestive) heart failure: Secondary | ICD-10-CM | POA: Diagnosis not present

## 2023-09-20 DIAGNOSIS — E785 Hyperlipidemia, unspecified: Secondary | ICD-10-CM | POA: Diagnosis not present

## 2023-09-20 DIAGNOSIS — H34212 Partial retinal artery occlusion, left eye: Secondary | ICD-10-CM | POA: Diagnosis not present

## 2023-09-20 DIAGNOSIS — Z7951 Long term (current) use of inhaled steroids: Secondary | ICD-10-CM | POA: Diagnosis not present

## 2023-09-21 ENCOUNTER — Encounter (HOSPITAL_COMMUNITY): Payer: Medicare Other

## 2023-09-21 ENCOUNTER — Other Ambulatory Visit: Payer: Self-pay

## 2023-09-21 DIAGNOSIS — I081 Rheumatic disorders of both mitral and tricuspid valves: Secondary | ICD-10-CM | POA: Diagnosis not present

## 2023-09-21 DIAGNOSIS — J449 Chronic obstructive pulmonary disease, unspecified: Secondary | ICD-10-CM

## 2023-09-21 DIAGNOSIS — Z7901 Long term (current) use of anticoagulants: Secondary | ICD-10-CM | POA: Diagnosis not present

## 2023-09-21 DIAGNOSIS — I2510 Atherosclerotic heart disease of native coronary artery without angina pectoris: Secondary | ICD-10-CM | POA: Diagnosis not present

## 2023-09-21 DIAGNOSIS — M8580 Other specified disorders of bone density and structure, unspecified site: Secondary | ICD-10-CM | POA: Diagnosis not present

## 2023-09-21 DIAGNOSIS — R0609 Other forms of dyspnea: Secondary | ICD-10-CM

## 2023-09-21 DIAGNOSIS — I110 Hypertensive heart disease with heart failure: Secondary | ICD-10-CM | POA: Diagnosis not present

## 2023-09-21 DIAGNOSIS — J9621 Acute and chronic respiratory failure with hypoxia: Secondary | ICD-10-CM | POA: Diagnosis not present

## 2023-09-21 DIAGNOSIS — E785 Hyperlipidemia, unspecified: Secondary | ICD-10-CM | POA: Diagnosis not present

## 2023-09-21 DIAGNOSIS — J309 Allergic rhinitis, unspecified: Secondary | ICD-10-CM | POA: Diagnosis not present

## 2023-09-21 DIAGNOSIS — I480 Paroxysmal atrial fibrillation: Secondary | ICD-10-CM | POA: Diagnosis not present

## 2023-09-21 DIAGNOSIS — I5042 Chronic combined systolic (congestive) and diastolic (congestive) heart failure: Secondary | ICD-10-CM | POA: Diagnosis not present

## 2023-09-21 DIAGNOSIS — H34212 Partial retinal artery occlusion, left eye: Secondary | ICD-10-CM | POA: Diagnosis not present

## 2023-09-21 DIAGNOSIS — I428 Other cardiomyopathies: Secondary | ICD-10-CM | POA: Diagnosis not present

## 2023-09-21 DIAGNOSIS — Z7951 Long term (current) use of inhaled steroids: Secondary | ICD-10-CM | POA: Diagnosis not present

## 2023-09-21 DIAGNOSIS — J441 Chronic obstructive pulmonary disease with (acute) exacerbation: Secondary | ICD-10-CM | POA: Diagnosis not present

## 2023-09-21 DIAGNOSIS — I712 Thoracic aortic aneurysm, without rupture, unspecified: Secondary | ICD-10-CM | POA: Diagnosis not present

## 2023-09-21 DIAGNOSIS — I447 Left bundle-branch block, unspecified: Secondary | ICD-10-CM | POA: Diagnosis not present

## 2023-09-22 ENCOUNTER — Encounter: Payer: Self-pay | Admitting: Cardiovascular Disease

## 2023-09-22 ENCOUNTER — Ambulatory Visit: Attending: Cardiovascular Disease | Admitting: Cardiovascular Disease

## 2023-09-22 VITALS — BP 144/76 | HR 68 | Ht 64.0 in | Wt 123.6 lb

## 2023-09-22 DIAGNOSIS — I5022 Chronic systolic (congestive) heart failure: Secondary | ICD-10-CM

## 2023-09-22 DIAGNOSIS — E782 Mixed hyperlipidemia: Secondary | ICD-10-CM

## 2023-09-22 DIAGNOSIS — I351 Nonrheumatic aortic (valve) insufficiency: Secondary | ICD-10-CM

## 2023-09-22 DIAGNOSIS — Z952 Presence of prosthetic heart valve: Secondary | ICD-10-CM | POA: Diagnosis not present

## 2023-09-22 DIAGNOSIS — I7121 Aneurysm of the ascending aorta, without rupture: Secondary | ICD-10-CM

## 2023-09-22 DIAGNOSIS — I428 Other cardiomyopathies: Secondary | ICD-10-CM

## 2023-09-22 DIAGNOSIS — I10 Essential (primary) hypertension: Secondary | ICD-10-CM

## 2023-09-22 DIAGNOSIS — I480 Paroxysmal atrial fibrillation: Secondary | ICD-10-CM

## 2023-09-22 NOTE — Assessment & Plan Note (Signed)
 History of essential hypertension her blood pressure measured today at 144/76.  She is on bisoprolol and losartan.

## 2023-09-22 NOTE — Progress Notes (Signed)
 09/22/2023 Dana Pruitt   02-09-1941  [id number]  Primary Physician Merri Brunette, MD Primary Cardiologist: Runell Gess MD Dana Pruitt, [place]  HPI:  Dana Pruitt is a 83 y.o.   thin-appearing married Caucasian female mother of 2, grandmother of 2 grandchildren whose mother Dana Pruitt was also a patient of mine and died at the age of 82. She was referred by Dr. Merri Brunette for cardiovascular evaluation because of echocardiogram performed 02/16/14 that showed moderate left ventricular dysfunction with an ejection fraction of 35-40% and moderate aortic insufficiency.  I last saw her in the office 08/03/2023.  Her cardiovascular risk factor profile is remarkable for remote tobacco abuse having quit in 1990 and treated hyperlipidemia. There is no family history of heart disease. She has never had a heart attack or stroke and denies chest pain or shortness of breath. She did have a Hollenhorst plaque documented by ophthalmologic exam several months ago. Carotid Dopplers performed since that time were normal. The plaque is since resolved. A 2-D echo was performed that showed an EF of 35-40% with moderate aortic insufficiency. LV size is normal and there was no LVH. She did have breast cancer documented by lumpectomy in 2013 and had chemotherapy and radiation therapy. Her oncologist was Dr. Nelly Rout . She is on Femara . I began her on low-dose carvedilol and repeated her 2-D echocardiogram on 08/10/14 revealed improvement in her LV function up to 45-50%. She continues to have moderate to severe AI with normal LV size and function.. She only complains of mild dyspnea on exertion. Since I saw her year ago she should make remained clinically stable. She denies chest pain or shortness of breath   2D echo performed 10/04/2018 revealed EF of 40 to 45% with normal LV size with severe aortic insufficiency.  She continues to deny symptoms of shortness of breath.   She was admitted in July with  chest pain and shortness of breath and underwent right left heart cath by Dr. Shirlee Latch revealing minimal CAD with severe AI.  She ultimately underwent bioprosthetic AVR by Dr. Maren Beach and a follow-up admission 01/31/2019 with a 21 mm Inspira's Edwards bioprosthetic valve.  She was hospitalized for 10 days.  She continues to recuperate.  She denies chest pain or shortness of breath.  She remains on Coumadin anticoagulation because of preop A. fib which converted prior to discharge on amiodarone which has since been discontinued.  She is participating cardiac rehab.  She was on 1 L of O2 with exercise and at night which she was not on prior to admission I think can probably be discontinued.   She  was seen in the emergency room for PAF which converted spontaneously several hours later.  She had a recurrent episode after that.  She saw Rudi Coco in the A. fib clinic 03/13/2020.  She was started on Eliquis oral anticoagulation and her carvedilol was down titrated because of dizziness.  Recent 2D echo performed 10/11/2019 revealed normal LV systolic function with a well-functioning aortic bioprosthesis.  Since I saw her in the office 2 months ago she was admitted to the hospital 08/30/2023 with chest pain.  She underwent right left heart cath revealing normal coronary arteries and normal filling pressures.  2D echo during that hospitalization revealed EF of 30 to 35% with no evidence of AI and a peak gradient of 22 mmHg across her aortic valve.  She was discharged home and readmitted with influenza A pneumonia which she is  slowly recovering from.  She is wearing oxygen 4 L 24/7 with a oxygen sat of 94% today.  She was in A-fib initially during her initial hospitalization and converted on amiodarone.   Current Meds  Medication Sig   albuterol (PROAIR HFA) 108 (90 Base) MCG/ACT inhaler 2 puffs every 4 hours as needed only  if your can't catch your breath   albuterol (PROVENTIL) (2.5 MG/3ML) 0.083% nebulizer  solution Take 3 mLs (2.5 mg total) by nebulization every 6 (six) hours as needed for wheezing or shortness of breath.   amiodarone (PACERONE) 200 MG tablet Take 1 tablet (200 mg total) by mouth daily.   atorvastatin (LIPITOR) 20 MG tablet Take 20 mg by mouth daily.    BREZTRI AEROSPHERE 160-9-4.8 MCG/ACT AERO INHALE 2 PUFFS INTO THE LUNGS TWICE DAILY   Cholecalciferol (VITAMIN D3) 25 MCG (1000 UT) CAPS Take 1,000 Units by mouth daily.   ELIQUIS 2.5 MG TABS tablet TAKE 1 TABLET(2.5 MG) BY MOUTH TWICE DAILY   ezetimibe (ZETIA) 10 MG tablet Take 10 mg by mouth daily.   FEROSUL 325 (65 Fe) MG tablet Take 325 mg by mouth every other day.   fluticasone (FLONASE) 50 MCG/ACT nasal spray SHAKE LIQUID AND USE 2 SPRAYS IN EACH NOSTRIL DAILY   guaiFENesin (MUCINEX) 600 MG 12 hr tablet Take 1 tablet (600 mg total) by mouth 2 (two) times daily.   guaiFENesin-dextromethorphan (ROBITUSSIN DM) 100-10 MG/5ML syrup Take 5 mLs by mouth every 4 (four) hours as needed for cough.   ipratropium (ATROVENT) 0.06 % nasal spray Place 2 sprays into both nostrils 4 (four) times daily.   OXYGEN Inhale 4 L/min into the lungs continuous.   predniSONE (DELTASONE) 10 MG tablet Take by mouth: 4 tabs daily for two days; then 3 tabs daily for two days; then 2 tabs daily for two days; then 1 tab daily for two days, then stop.   sertraline (ZOLOFT) 100 MG tablet Take 200 mg by mouth daily.   valACYclovir (VALTREX) 1000 MG tablet Take 500 mg by mouth as needed.     No Known Allergies  Social History   Socioeconomic History   Marital status: Married    Spouse name: Not on file   Number of children: 2   Years of education: college   Highest education level: Bachelor's degree (e.g., BA, AB, BS)  Occupational History    Employer: RETIRED  Tobacco Use   Smoking status: Former    Current packs/day: 0.00    Average packs/day: 3.0 packs/day for 32.1 years (96.3 ttl pk-yrs)    Types: Cigarettes    Start date: 05/19/1958    Quit  date: 06/30/1990    Years since quitting: 33.2   Smokeless tobacco: Never   Tobacco comments:    Stopped smoking for 2-3 years during second child.   Vaping Use   Vaping status: Never Used  Substance and Sexual Activity   Alcohol use: Not Currently   Drug use: No   Sexual activity: Yes    Birth control/protection: Post-menopausal    Comment: Menarche age 72, G60,P2, Menopause in 15"s, No HRT Married  Other Topics Concern   Not on file  Social History Narrative   [facility] (12/29/16):   Originally from [place], [place]. Moved to [place] at 83 y.o. Does have a dog. Hasn't worked outside the home. Remote parakeet exposure from her children. No mold or hot tub exposure. Enjoys doing yard work & walking.    Social Drivers of Health  Financial Resource Strain: Not on file  Food Insecurity: No Food Insecurity (09/17/2023)   Hunger Vital Sign    Worried About Running Out of Food in the Last Year: Never true    Ran Out of Food in the Last Year: Never true  Transportation Needs: No Transportation Needs (09/17/2023)   PRAPARE - Administrator, Civil Service (Medical): No    Lack of Transportation (Non-Medical): No  Physical Activity: Not on file  Stress: Not on file  Social Connections: Moderately Integrated (09/14/2023)   Social Connection and Isolation Panel [NHANES]    Frequency of Communication with Friends and Family: More than three times a week    Frequency of Social Gatherings with Friends and Family: Twice a week    Attends Religious Services: Never    Database administrator or Organizations: Yes    Attends Engineer, structural: More than 4 times per year    Marital Status: Married  Catering manager Violence: Not At Risk (09/17/2023)   Humiliation, Afraid, Rape, and Kick questionnaire    Fear of Current or Ex-Partner: No    Emotionally Abused: No    Physically Abused: No    Sexually Abused: No     Review of Systems: General: negative for chills, fever,  night sweats or weight changes.  Cardiovascular: negative for chest pain, dyspnea on exertion, edema, orthopnea, palpitations, paroxysmal nocturnal dyspnea or shortness of breath Dermatological: negative for rash Respiratory: negative for cough or wheezing Urologic: negative for hematuria Abdominal: negative for nausea, vomiting, diarrhea, bright red blood per rectum, melena, or hematemesis Neurologic: negative for visual changes, syncope, or dizziness All other systems reviewed and are otherwise negative except as noted above.    Blood pressure (!) 144/76, pulse 68, height 5\' 4"  (1.626 m), weight 123 lb 9.6 oz (56.1 kg), SpO2 94%.  General appearance: alert and no distress Neck: no adenopathy, no carotid bruit, no JVD, supple, symmetrical, trachea midline, and thyroid not enlarged, symmetric, no tenderness/mass/nodules Lungs: clear to auscultation bilaterally Heart: regular rate and rhythm, S1, S2 normal, no murmur, click, rub or gallop Extremities: extremities normal, atraumatic, no cyanosis or edema Pulses: 2+ and symmetric Skin: Skin color, texture, turgor normal. No rashes or lesions Neurologic: Grossly normal  EKG not performed today      ASSESSMENT AND PLAN:   HLD (hyperlipidemia) History of hyperlipidemia on statin therapy with lipid profile performed 08/31/2023 revealing total cholesterol 118, LDL 47 and HDL of 47  Severe aortic regurgitation History of severe aortic insufficiency status post bioprosthetic AVR by Dr. Maren Beach 01/31/2019 with a 21 mm Inspira Edwards bioprosthetic valve.  Her most recent 2D echo performed 08/26/2023 revealed an EF of 30 to 35%, lateral septal dyssynergy Synergy related to left bundle branch block, no mitral insufficiency with a transvalvular gradient of 12 mean and 22 peak.  Valve area was 0.72 cm.  PAF (paroxysmal atrial fibrillation) (HCC) History of PAF maintaining sinus rhythm on amiodarone and low-dose Eliquis.  Essential  hypertension History of essential hypertension her blood pressure measured today at 144/76.  She is on bisoprolol and losartan.  Thoracic aortic aneurysm (HCC) Small ascending thoracic aortic aneurysm measuring 43 mm by 2D echo.  This will be repeated on an annual basis.  Non-ischemic cardiomyopathy (HCC) History of nonischemic cardiomyopathy with an normal coronary arteries by recent cath and EF of 30 to 35% by 2D echo 08/26/2023 with left bundle branch block dyssynchrony and no significant valvular abnormalities.  She is on a  beta-blocker and an ARB.     Runell Gess MD FACP,FACC,FAHA, [facility] 09/22/2023 11:11 AM

## 2023-09-22 NOTE — Assessment & Plan Note (Signed)
 History of nonischemic cardiomyopathy with an normal coronary arteries by recent cath and EF of 30 to 35% by 2D echo 08/26/2023 with left bundle branch block dyssynchrony and no significant valvular abnormalities.  She is on a beta-blocker and an ARB.

## 2023-09-22 NOTE — Assessment & Plan Note (Signed)
 Small ascending thoracic aortic aneurysm measuring 43 mm by 2D echo.  This will be repeated on an annual basis.

## 2023-09-22 NOTE — Patient Instructions (Signed)
 Medication Instructions:  NO CHANGES *If you need a refill on your cardiac medications before your next appointment, please call your pharmacy*  Testing/Procedures: Your physician has requested that you have an echocardiogram. Echocardiography is a painless test that uses sound waves to create images of your heart. It provides your doctor with information about the size and shape of your heart and how well your heart's chambers and valves are working. This procedure takes approximately one hour. There are no restrictions for this procedure. Please do NOT wear cologne, perfume, aftershave, or lotions (deodorant is allowed). Please arrive 15 minutes prior to your appointment time.  Please note: We ask at that you not bring children with you during ultrasound (echo/ vascular) testing. Due to room size and safety concerns, children are not allowed in the ultrasound rooms during exams. Our front office staff cannot provide observation of children in our lobby area while testing is being conducted. An adult accompanying a patient to their appointment will only be allowed in the ultrasound room at the discretion of the ultrasound technician under special circumstances. We apologize for any inconvenience.  DUE 08/2024  Follow-Up: At [facility], you and your health needs are our priority.  As part of our continuing mission to provide you with exceptional heart care, our providers are all part of one team.  This team includes your primary Cardiologist (physician) and Advanced Practice Providers or APPs (Physician Assistants and Nurse Practitioners) who all work together to provide you with the care you need, when you need it.  Your next appointment:   3 months with Marjie Skiff PA  12 months with Dr. Allyson Sabal  We recommend signing up for the patient portal called "MyChart".  Sign up information is provided on this After Visit Summary.  MyChart is used to connect with patients for Virtual Visits  (Telemedicine).  Patients are able to view lab/test results, encounter notes, upcoming appointments, etc.  Non-urgent messages can be sent to your provider as well.   To learn more about what you can do with MyChart, go to [email or web address].        1st Floor: - Lobby - Registration  - Pharmacy  - Lab - Cafe  2nd Floor: - PV Lab - Diagnostic Testing (echo, CT, nuclear med)  3rd Floor: - Vacant  4th Floor: - TCTS (cardiothoracic surgery) - AFib Clinic - Structural Heart Clinic - Vascular Surgery  - Vascular Ultrasound  5th Floor: - HeartCare Cardiology (general and EP) - Clinical Pharmacy for coumadin, hypertension, lipid, weight-loss medications, and med management appointments    Valet parking services will be available as well.

## 2023-09-22 NOTE — Assessment & Plan Note (Signed)
 History of PAF maintaining sinus rhythm on amiodarone and low-dose Eliquis.

## 2023-09-22 NOTE — Assessment & Plan Note (Signed)
 History of severe aortic insufficiency status post bioprosthetic AVR by Dr. Maren Beach 01/31/2019 with a 21 mm Ansel Bong bioprosthetic valve.  Her most recent 2D echo performed 08/26/2023 revealed an EF of 30 to 35%, lateral septal dyssynergy Synergy related to left bundle branch block, no mitral insufficiency with a transvalvular gradient of 12 mean and 22 peak.  Valve area was 0.72 cm.

## 2023-09-22 NOTE — Assessment & Plan Note (Addendum)
 History of hyperlipidemia on statin therapy with lipid profile performed 08/31/2023 revealing total cholesterol 118, LDL 47 and HDL of 47

## 2023-09-23 ENCOUNTER — Other Ambulatory Visit: Payer: Self-pay

## 2023-09-23 ENCOUNTER — Encounter (HOSPITAL_COMMUNITY): Payer: Medicare Other

## 2023-09-23 DIAGNOSIS — D509 Iron deficiency anemia, unspecified: Secondary | ICD-10-CM | POA: Diagnosis not present

## 2023-09-23 NOTE — Patient Outreach (Signed)
 Care Management  Transitions of Care Program Transitions of Care Post-discharge week 2   09/23/2023 Name: Dana Pruitt MRN: [id number] DOB: [birth year]  Subjective: Dana Pruitt is a 83 y.o. year old female who is a primary care patient of Merri Brunette, MD. The Care Management team Engaged with patient Engaged with patient by telephone to assess and address transitions of care needs.   Consent to Services:  Patient was given information about care management services, agreed to services, and gave verbal consent to participate.   Assessment: TOC Outreach completed today. The patient is stable on Oxygen 4l/min. Her sat is 94%. She states she is not quite back to her usual state of health from the Pneumonia but she is getting better. [facility] has started care with a SN and HHPT will start next week. She went to see Dr. Gery Pray, her cardiologist on 09/22/23. Reviewed her medication list with updates. Weight is 123lbs which is about her usual weight.     SDOH Interventions    Flowsheet Row Patient Outreach from 09/23/2023 in [place] POPULATION HEALTH DEPARTMENT Telephone from 09/17/2023 in [place] POPULATION HEALTH DEPARTMENT Telephone from 09/03/2023 in [place] POPULATION HEALTH DEPARTMENT Pulmonary Rehab Walk Test from 07/26/2023 in [facility] Telephone from 08/26/2022 in [facility] Community Care Coordination  SDOH Interventions       Food Insecurity Interventions Intervention Not Indicated Intervention Not Indicated Intervention Not Indicated -- --  Housing Interventions Intervention Not Indicated Intervention Not Indicated Intervention Not Indicated -- --  Transportation Interventions Intervention Not Indicated Intervention Not Indicated Intervention Not Indicated -- Intervention Not Indicated  Utilities Interventions Intervention Not Indicated Intervention Not Indicated Intervention Not Indicated -- --  Depression  Interventions/Treatment  -- -- -- Currently on Treatment --        Goals Addressed             This Visit's Progress    [facility] Outreach Care Plan   On track    Current Barriers:  (reviewed 09/23/23) Chronic Disease Management support and education needs related to COPD   RNCM Clinical Goal(s): (reviewed 09/23/23) Patient will work with the Care Management team over the next 30 days to address Transition of Care Barriers: Medication access Medication Management Diet/Nutrition/Food Resources Support at home Provider appointments Home Health services Equipment/DME verbalize understanding of plan for management of COPD as evidenced by EMR, verbal teachback attend all scheduled medical appointments: The patient has many specialist to follow up  as evidenced by EMR  through collaboration with RN Care manager, provider, and care team.   Interventions: (reviewed 09/23/23) Evaluation of current treatment plan related to  self management and patient's adherence to plan as established by provider   COPD Interventions:  (Status:  Goal on track:  Yes.) Short Term Goal Provided patient with basic written and verbal COPD education on self care/management/and exacerbation prevention Advised patient to track and manage COPD triggers Advised patient to engage in light exercise as tolerated 3-5 days a week to aid in the the management of COPD Provided education about and advised patient to utilize infection prevention strategies to reduce risk of respiratory infection Discussed the importance of adequate rest and management of fatigue with COPD Wear Oxygen continuous at 4l/min Maintain oxygen saturation above 91% Utilize nebulizer and inhalers as instructed Practice energy conservation  Patient Goals/Self-Care Activities: (reviewed 09/23/23) Participate in Transition of Care Program/Attend [facility] scheduled calls Notify RN Care Manager of TOC  call rescheduling needs Take all medications as  prescribed Attend all scheduled provider appointments Call pharmacy for medication refills 3-7 days in advance of running out of medications Perform all self care activities independently  do breathing exercises every day develop a rescue plan eliminate symptom triggers at home use devices that will help like a cane, sock-puller or reacher  Follow Up Plan:  Telephone follow up appointment with care management team member scheduled for:  Thursday April 10th at 12noon         Plan: The patient has been provided with contact information for the care management team and has been advised to call with any health related questions or concerns.   Routine follow-up and on-going assessment evaluation and education of disease processes, and recommended interventions for both chronic and acute medical conditions, will occur during each weekly visit during [facility] 30-day Program Outreach calls along with ongoing review of symptoms, medication reviews and reconciliation. Any updates, inconsistencies, discrepancies or acute care concerns will be addressed on the Care Plan and routed to the correct Practitioner if indicated.    The patient has been provided with contact information for the care management team and has been advised to call with any health-related questions or concerns. The patient verbalized understanding with current POC. The patient is directed to their insurance card regarding availability of benefits coverage.  Deidre Ala, BSN, RN [place]  VBCI - [employer] [facility] RN Care Manager [phone]

## 2023-09-23 NOTE — Patient Instructions (Signed)
 Visit Information  Thank you for taking time to visit with me today. Please don't hesitate to contact me if I can be of assistance to you before our next scheduled telephone appointment.  Our next appointment is by telephone on Thursday April 10th at 12pm  Following is a copy of your care plan:   Goals Addressed             This Visit's Progress    [facility] Outreach Care Plan   On track    Current Barriers:  (reviewed 09/23/23) Chronic Disease Management support and education needs related to COPD   RNCM Clinical Goal(s): (reviewed 09/23/23) Patient will work with the Care Management team over the next 30 days to address Transition of Care Barriers: Medication access Medication Management Diet/Nutrition/Food Resources Support at home Provider appointments Home Health services Equipment/DME verbalize understanding of plan for management of COPD as evidenced by EMR, verbal teachback attend all scheduled medical appointments: The patient has many specialist to follow up  as evidenced by EMR  through collaboration with RN Care manager, provider, and care team.   Interventions: (reviewed 09/23/23) Evaluation of current treatment plan related to  self management and patient's adherence to plan as established by provider   COPD Interventions:  (Status:  Goal on track:  Yes.) Short Term Goal Provided patient with basic written and verbal COPD education on self care/management/and exacerbation prevention Advised patient to track and manage COPD triggers Advised patient to engage in light exercise as tolerated 3-5 days a week to aid in the the management of COPD Provided education about and advised patient to utilize infection prevention strategies to reduce risk of respiratory infection Discussed the importance of adequate rest and management of fatigue with COPD Wear Oxygen continuous at 4l/min Maintain oxygen saturation above 91% Utilize nebulizer and inhalers as instructed Practice energy  conservation  Patient Goals/Self-Care Activities: (reviewed 09/23/23) Participate in Transition of Care Program/Attend [facility] scheduled calls Notify RN Care Manager of TOC call rescheduling needs Take all medications as prescribed Attend all scheduled provider appointments Call pharmacy for medication refills 3-7 days in advance of running out of medications Perform all self care activities independently  do breathing exercises every day develop a rescue plan eliminate symptom triggers at home use devices that will help like a cane, sock-puller or reacher  Follow Up Plan:  Telephone follow up appointment with care management team member scheduled for:  Thursday April 10th at 12noon         Patient verbalizes understanding of instructions and care plan provided today and agrees to view in MyChart. Active MyChart status and patient understanding of how to access instructions and care plan via MyChart confirmed with patient.     The patient has been provided with contact information for the care management team and has been advised to call with any health related questions or concerns.   Please call the care guide team at [phone] if you need to cancel or reschedule your appointment.   Please call the Suicide and Crisis Lifeline: 988 call the [place] National Suicide Prevention Lifeline: 216-780-0898 or TTY: 4632314756 TTY 630-888-0049) to talk to a trained counselor if you are experiencing a Mental Health or Behavioral Health Crisis or need someone to talk to. Deidre Ala, BSN, RN [place]  VBCI - [employer] [facility] RN Care Manager [phone]

## 2023-09-24 ENCOUNTER — Other Ambulatory Visit: Payer: Self-pay | Admitting: *Deleted

## 2023-09-24 DIAGNOSIS — J9611 Chronic respiratory failure with hypoxia: Secondary | ICD-10-CM

## 2023-09-24 DIAGNOSIS — J449 Chronic obstructive pulmonary disease, unspecified: Secondary | ICD-10-CM

## 2023-09-27 DIAGNOSIS — I712 Thoracic aortic aneurysm, without rupture, unspecified: Secondary | ICD-10-CM | POA: Diagnosis not present

## 2023-09-27 DIAGNOSIS — E785 Hyperlipidemia, unspecified: Secondary | ICD-10-CM | POA: Diagnosis not present

## 2023-09-27 DIAGNOSIS — J9621 Acute and chronic respiratory failure with hypoxia: Secondary | ICD-10-CM | POA: Diagnosis not present

## 2023-09-27 DIAGNOSIS — I480 Paroxysmal atrial fibrillation: Secondary | ICD-10-CM | POA: Diagnosis not present

## 2023-09-27 DIAGNOSIS — I5042 Chronic combined systolic (congestive) and diastolic (congestive) heart failure: Secondary | ICD-10-CM | POA: Diagnosis not present

## 2023-09-27 DIAGNOSIS — Z7951 Long term (current) use of inhaled steroids: Secondary | ICD-10-CM | POA: Diagnosis not present

## 2023-09-27 DIAGNOSIS — Z7901 Long term (current) use of anticoagulants: Secondary | ICD-10-CM | POA: Diagnosis not present

## 2023-09-27 DIAGNOSIS — I2510 Atherosclerotic heart disease of native coronary artery without angina pectoris: Secondary | ICD-10-CM | POA: Diagnosis not present

## 2023-09-27 DIAGNOSIS — I081 Rheumatic disorders of both mitral and tricuspid valves: Secondary | ICD-10-CM | POA: Diagnosis not present

## 2023-09-27 DIAGNOSIS — J309 Allergic rhinitis, unspecified: Secondary | ICD-10-CM | POA: Diagnosis not present

## 2023-09-27 DIAGNOSIS — J441 Chronic obstructive pulmonary disease with (acute) exacerbation: Secondary | ICD-10-CM | POA: Diagnosis not present

## 2023-09-27 DIAGNOSIS — M8580 Other specified disorders of bone density and structure, unspecified site: Secondary | ICD-10-CM | POA: Diagnosis not present

## 2023-09-27 DIAGNOSIS — I110 Hypertensive heart disease with heart failure: Secondary | ICD-10-CM | POA: Diagnosis not present

## 2023-09-27 DIAGNOSIS — I447 Left bundle-branch block, unspecified: Secondary | ICD-10-CM | POA: Diagnosis not present

## 2023-09-27 DIAGNOSIS — I428 Other cardiomyopathies: Secondary | ICD-10-CM | POA: Diagnosis not present

## 2023-09-27 DIAGNOSIS — H34212 Partial retinal artery occlusion, left eye: Secondary | ICD-10-CM | POA: Diagnosis not present

## 2023-09-28 ENCOUNTER — Encounter (HOSPITAL_COMMUNITY): Payer: Medicare Other

## 2023-09-28 DIAGNOSIS — I110 Hypertensive heart disease with heart failure: Secondary | ICD-10-CM | POA: Diagnosis not present

## 2023-09-28 DIAGNOSIS — I2510 Atherosclerotic heart disease of native coronary artery without angina pectoris: Secondary | ICD-10-CM | POA: Diagnosis not present

## 2023-09-28 DIAGNOSIS — I5042 Chronic combined systolic (congestive) and diastolic (congestive) heart failure: Secondary | ICD-10-CM | POA: Diagnosis not present

## 2023-09-28 DIAGNOSIS — Z7951 Long term (current) use of inhaled steroids: Secondary | ICD-10-CM | POA: Diagnosis not present

## 2023-09-28 DIAGNOSIS — J309 Allergic rhinitis, unspecified: Secondary | ICD-10-CM | POA: Diagnosis not present

## 2023-09-28 DIAGNOSIS — J441 Chronic obstructive pulmonary disease with (acute) exacerbation: Secondary | ICD-10-CM | POA: Diagnosis not present

## 2023-09-28 DIAGNOSIS — I428 Other cardiomyopathies: Secondary | ICD-10-CM | POA: Diagnosis not present

## 2023-09-28 DIAGNOSIS — I480 Paroxysmal atrial fibrillation: Secondary | ICD-10-CM | POA: Diagnosis not present

## 2023-09-28 DIAGNOSIS — E785 Hyperlipidemia, unspecified: Secondary | ICD-10-CM | POA: Diagnosis not present

## 2023-09-28 DIAGNOSIS — Z7901 Long term (current) use of anticoagulants: Secondary | ICD-10-CM | POA: Diagnosis not present

## 2023-09-28 DIAGNOSIS — I447 Left bundle-branch block, unspecified: Secondary | ICD-10-CM | POA: Diagnosis not present

## 2023-09-28 DIAGNOSIS — H34212 Partial retinal artery occlusion, left eye: Secondary | ICD-10-CM | POA: Diagnosis not present

## 2023-09-28 DIAGNOSIS — M8580 Other specified disorders of bone density and structure, unspecified site: Secondary | ICD-10-CM | POA: Diagnosis not present

## 2023-09-28 DIAGNOSIS — I081 Rheumatic disorders of both mitral and tricuspid valves: Secondary | ICD-10-CM | POA: Diagnosis not present

## 2023-09-28 DIAGNOSIS — I712 Thoracic aortic aneurysm, without rupture, unspecified: Secondary | ICD-10-CM | POA: Diagnosis not present

## 2023-09-28 DIAGNOSIS — D649 Anemia, unspecified: Secondary | ICD-10-CM | POA: Diagnosis not present

## 2023-09-28 DIAGNOSIS — J9621 Acute and chronic respiratory failure with hypoxia: Secondary | ICD-10-CM | POA: Diagnosis not present

## 2023-09-29 DIAGNOSIS — J441 Chronic obstructive pulmonary disease with (acute) exacerbation: Secondary | ICD-10-CM | POA: Diagnosis not present

## 2023-09-29 DIAGNOSIS — Z7901 Long term (current) use of anticoagulants: Secondary | ICD-10-CM | POA: Diagnosis not present

## 2023-09-29 DIAGNOSIS — I480 Paroxysmal atrial fibrillation: Secondary | ICD-10-CM | POA: Diagnosis not present

## 2023-09-29 DIAGNOSIS — J9621 Acute and chronic respiratory failure with hypoxia: Secondary | ICD-10-CM | POA: Diagnosis not present

## 2023-09-29 DIAGNOSIS — E785 Hyperlipidemia, unspecified: Secondary | ICD-10-CM | POA: Diagnosis not present

## 2023-09-29 DIAGNOSIS — I081 Rheumatic disorders of both mitral and tricuspid valves: Secondary | ICD-10-CM | POA: Diagnosis not present

## 2023-09-29 DIAGNOSIS — H34212 Partial retinal artery occlusion, left eye: Secondary | ICD-10-CM | POA: Diagnosis not present

## 2023-09-29 DIAGNOSIS — J309 Allergic rhinitis, unspecified: Secondary | ICD-10-CM | POA: Diagnosis not present

## 2023-09-29 DIAGNOSIS — I110 Hypertensive heart disease with heart failure: Secondary | ICD-10-CM | POA: Diagnosis not present

## 2023-09-29 DIAGNOSIS — Z7951 Long term (current) use of inhaled steroids: Secondary | ICD-10-CM | POA: Diagnosis not present

## 2023-09-29 DIAGNOSIS — I447 Left bundle-branch block, unspecified: Secondary | ICD-10-CM | POA: Diagnosis not present

## 2023-09-29 DIAGNOSIS — M8580 Other specified disorders of bone density and structure, unspecified site: Secondary | ICD-10-CM | POA: Diagnosis not present

## 2023-09-29 DIAGNOSIS — I428 Other cardiomyopathies: Secondary | ICD-10-CM | POA: Diagnosis not present

## 2023-09-29 DIAGNOSIS — I5042 Chronic combined systolic (congestive) and diastolic (congestive) heart failure: Secondary | ICD-10-CM | POA: Diagnosis not present

## 2023-09-29 DIAGNOSIS — I2510 Atherosclerotic heart disease of native coronary artery without angina pectoris: Secondary | ICD-10-CM | POA: Diagnosis not present

## 2023-09-29 DIAGNOSIS — I712 Thoracic aortic aneurysm, without rupture, unspecified: Secondary | ICD-10-CM | POA: Diagnosis not present

## 2023-09-30 ENCOUNTER — Encounter (HOSPITAL_COMMUNITY): Payer: Medicare Other

## 2023-09-30 ENCOUNTER — Other Ambulatory Visit: Payer: Self-pay

## 2023-09-30 DIAGNOSIS — I110 Hypertensive heart disease with heart failure: Secondary | ICD-10-CM | POA: Diagnosis not present

## 2023-09-30 DIAGNOSIS — I2510 Atherosclerotic heart disease of native coronary artery without angina pectoris: Secondary | ICD-10-CM | POA: Diagnosis not present

## 2023-09-30 DIAGNOSIS — J9621 Acute and chronic respiratory failure with hypoxia: Secondary | ICD-10-CM | POA: Diagnosis not present

## 2023-09-30 DIAGNOSIS — J309 Allergic rhinitis, unspecified: Secondary | ICD-10-CM | POA: Diagnosis not present

## 2023-09-30 DIAGNOSIS — I081 Rheumatic disorders of both mitral and tricuspid valves: Secondary | ICD-10-CM | POA: Diagnosis not present

## 2023-09-30 DIAGNOSIS — H34212 Partial retinal artery occlusion, left eye: Secondary | ICD-10-CM | POA: Diagnosis not present

## 2023-09-30 DIAGNOSIS — I480 Paroxysmal atrial fibrillation: Secondary | ICD-10-CM | POA: Diagnosis not present

## 2023-09-30 DIAGNOSIS — Z7951 Long term (current) use of inhaled steroids: Secondary | ICD-10-CM | POA: Diagnosis not present

## 2023-09-30 DIAGNOSIS — I712 Thoracic aortic aneurysm, without rupture, unspecified: Secondary | ICD-10-CM | POA: Diagnosis not present

## 2023-09-30 DIAGNOSIS — Z7901 Long term (current) use of anticoagulants: Secondary | ICD-10-CM | POA: Diagnosis not present

## 2023-09-30 DIAGNOSIS — E785 Hyperlipidemia, unspecified: Secondary | ICD-10-CM | POA: Diagnosis not present

## 2023-09-30 DIAGNOSIS — I428 Other cardiomyopathies: Secondary | ICD-10-CM | POA: Diagnosis not present

## 2023-09-30 DIAGNOSIS — I447 Left bundle-branch block, unspecified: Secondary | ICD-10-CM | POA: Diagnosis not present

## 2023-09-30 DIAGNOSIS — I5042 Chronic combined systolic (congestive) and diastolic (congestive) heart failure: Secondary | ICD-10-CM | POA: Diagnosis not present

## 2023-09-30 DIAGNOSIS — J441 Chronic obstructive pulmonary disease with (acute) exacerbation: Secondary | ICD-10-CM | POA: Diagnosis not present

## 2023-09-30 DIAGNOSIS — M8580 Other specified disorders of bone density and structure, unspecified site: Secondary | ICD-10-CM | POA: Diagnosis not present

## 2023-09-30 NOTE — Patient Outreach (Signed)
 Transition of Care week 3  Visit Note  09/30/2023  Name: Dana Pruitt MRN: [id number]          DOB: 19-Feb-1941  Situation: Patient enrolled in [facility] 30-day program. Visit completed with Doristine Counter by telephone.   Background:     Past Medical History:  Diagnosis Date   Anemia    "several times over the years" (10/17/2012)   Anxiety    Breast cancer (HCC) 02/2010   right breast   CAD (coronary artery disease)    a. LHC 12/2018: mild non-obstructive CAD   Carcinoma of breast treated with adjuvant hormone therapy (HCC) 02/2010   Femara   Chronic systolic CHF    a. Echo in 2015: LVEF of 35-40%, b. 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 After a couple of  minutes on the call the patient decided that she did not want to complete the assessment and declined further outreach.  Patient Reported Symptoms:  Cognitive Alert and oriented to person, place, and time  Neurological Not assessed    HEENT Not assessed    Cardiovascular Not assessed    Respiratory No symptoms reported    Endocrine Not assessed    Gastrointestinal Not assessed    Genitourinary Not assessed    Integumentary No symptoms reported    Musculoskeletal Not assessed    Psychosocial Not assessed     There were no vitals filed for this visit.  Medications Reviewed Today     Reviewed by Redge Gainer, RN (Case Manager) on 09/30/23  at 1159  Med List Status: <None>   Medication Order Taking? Sig Documenting Provider Last Dose Status Informant  albuterol (PROAIR HFA) 108 (90 Base) MCG/ACT inhaler [id number] No 2 puffs every 4 hours as needed only  if your can't catch your breath Nyoka Cowden, MD Taking Active Self, Pharmacy Records  albuterol (PROVENTIL) (2.5 MG/3ML) 0.083% nebulizer solution [id number] No Take 3 mLs (2.5 mg total) by nebulization every 6 (six) hours as needed for wheezing or shortness of breath. Noemi Chapel, NP Taking Active Self, Pharmacy Records           Med Note Neil Crouch, SEBASTIAN   Mon Sep 13, 2023  9:55 PM)    amiodarone (PACERONE) 200 MG tablet [id number] No Take 1 tablet (200 mg total) by mouth daily. Olena Leatherwood, PA-C Taking Active Self, Pharmacy Records           Med Note Neil Crouch, SEBASTIAN   Mon Sep 13, 2023  9:57 PM)    atorvastatin (LIPITOR) 20 MG tablet [id number] No Take 20 mg by mouth daily.  [provider] Taking Active Self, Pharmacy Records           Med Note ([facility], Mady Gemma Mar 08, 2017  2:22 PM)    bisoprolol (ZEBETA) 5 MG tablet [id number] No Take  0.5 tablets (2.5 mg total) by mouth daily. Olena Leatherwood, PA-C Taking Active Self, Pharmacy Records           Med Note [facility], Salah Nakamura   Thu Sep 23, 2023 12:19 PM)    Markus Daft AEROSPHERE 160-9-4.8 MCG/ACT AERO [id number] No INHALE 2 PUFFS INTO THE LUNGS TWICE DAILY Nyoka Cowden, MD Taking Active Self, Pharmacy Records  Cholecalciferol (VITAMIN D3) 25 MCG (1000 UT) CAPS [id number] No Take 1,000 Units by mouth daily. [provider] Taking Active Self, Pharmacy Records  ELIQUIS 2.5 MG TABS tablet [id number] No TAKE 1 TABLET(2.5 MG) BY MOUTH TWICE DAILY Runell Gess, MD Taking Active Self, Pharmacy Records  ezetimibe (ZETIA) 10 MG tablet [id number] No Take 10 mg by mouth daily. [provider] Taking Active Self, Pharmacy Records  FEROSUL 325 (65 Fe) MG tablet [id number] No Take 325 mg by mouth  every other day. [provider] Taking Active Self, Pharmacy Records  fluticasone [facility]) 50 MCG/ACT nasal spray [id number] No SHAKE LIQUID AND USE 2 SPRAYS IN EACH NOSTRIL DAILY Nyoka Cowden, MD Taking Active Self, Pharmacy Records  guaiFENesin (MUCINEX) 600 MG 12 hr tablet [id number] No Take 1 tablet (600 mg total) by mouth 2 (two) times daily. Rowe Clack, PA-C Taking Active Self, Pharmacy Records  guaiFENesin-dextromethorphan [facility] DM) 100-10 MG/5ML syrup [id number] No Take 5 mLs by mouth every 4 (four) hours as needed for cough. Lanae Boast, MD Taking Active   ipratropium (ATROVENT) 0.06 % nasal spray [id number] No Place 2 sprays into both nostrils 4 (four) times daily. Noemi Chapel, NP Taking Active Self, Pharmacy Records  losartan (COZAAR) 25 MG tablet [id number] No Take 25 mg by mouth daily. [provider] Taking Active Self, Pharmacy Records           Med Note [facility], Eboney [place]brook   Thu Sep 23, 2023 12:20 PM)    OXYGEN [id number] No Inhale 4 L/min into the lungs continuous. [provider] Taking Active   predniSONE (DELTASONE) 10 MG tablet [id number] No Take by mouth: 4 tabs daily for two days; then 3 tabs daily for two days; then 2 tabs daily for two days; then 1 tab daily for two days, then stop. Lanae Boast, MD Taking Active   sertraline (ZOLOFT) 100 MG tablet [id number] No Take 200 mg by mouth daily. [provider] Taking Active Self, Pharmacy Records  valACYclovir (VALTREX) 1000 MG tablet [id number] No Take 500 mg by mouth as needed. [provider] Taking Active Self, Pharmacy Records           Med Note [facility], SEBASTIAN   Mon Sep 13, 2023 10:00 PM) PRN, no recent need/use            Recommendation:   Completed the TOC 30 Day Outreach program.  Follow up with PCP as needed  Follow Up Plan:   Closing From:  Transitions of Care Program  [facility], BSN, RN [place]  VBCI - [facility] RN Care  Manager [phone]

## 2023-09-30 NOTE — Patient Instructions (Signed)
 Visit Information  Thank you for taking time to visit with me today. Please don't hesitate to contact me if I can be of assistance to you before our next scheduled telephone appointment.  Following is a copy of your care plan:   Goals Addressed             This Visit's Progress    COMPLETED: TOC Outreach Care Plan       Current Barriers:  (reviewed 09/23/23) Chronic Disease Management support and education needs related to COPD   RNCM Clinical Goal(s): (reviewed 09/23/23) Patient will work with the Care Management team over the next 30 days to address Transition of Care Barriers: Medication access Medication Management Diet/Nutrition/Food Resources Support at home Provider appointments Home Health services Equipment/DME verbalize understanding of plan for management of COPD as evidenced by EMR, verbal teachback attend all scheduled medical appointments: The patient has many specialist to follow up  as evidenced by EMR  through collaboration with RN Care manager, provider, and care team.   Interventions: (reviewed 09/23/23) Evaluation of current treatment plan related to  self management and patient's adherence to plan as established by provider   COPD Interventions:  (Status:  Goal on track:  Yes.) Short Term Goal Provided patient with basic written and verbal COPD education on self care/management/and exacerbation prevention Advised patient to track and manage COPD triggers Advised patient to engage in light exercise as tolerated 3-5 days a week to aid in the the management of COPD Provided education about and advised patient to utilize infection prevention strategies to reduce risk of respiratory infection Discussed the importance of adequate rest and management of fatigue with COPD Wear Oxygen continuous at 4l/min Maintain oxygen saturation above 91% Utilize nebulizer and inhalers as instructed Practice energy conservation  Patient Goals/Self-Care Activities: (reviewed  09/23/23) Participate in Transition of Care Program/Attend [facility] scheduled calls Notify RN Care Manager of TOC call rescheduling needs Take all medications as prescribed Attend all scheduled provider appointments Call pharmacy for medication refills 3-7 days in advance of running out of medications Perform all self care activities independently  do breathing exercises every day develop a rescue plan eliminate symptom triggers at home use devices that will help like a cane, sock-puller or reacher  Follow Up Plan:  Telephone follow up appointment with care management team member scheduled for:  Thursday April 10th at 12noon         Patient verbalizes understanding of instructions and care plan provided today and agrees to view in MyChart. Active MyChart status and patient understanding of how to access instructions and care plan via MyChart confirmed with patient.     The patient has been provided with contact information for the care management team and has been advised to call with any health related questions or concerns.   Please call the care guide team at [phone] if you need to cancel or reschedule your appointment.   Please call the Suicide and Crisis Lifeline: 988 call the [place] National Suicide Prevention Lifeline: (541)002-2577 or TTY: 586-059-5222 TTY 615-664-7773) to talk to a trained counselor if you are experiencing a Mental Health or Behavioral Health Crisis or need someone to talk to.  Deidre Ala, BSN, RN   VBCI - [employer] [facility] RN Care Manager [phone]

## 2023-10-01 ENCOUNTER — Telehealth (HOSPITAL_BASED_OUTPATIENT_CLINIC_OR_DEPARTMENT_OTHER): Payer: Self-pay

## 2023-10-01 ENCOUNTER — Telehealth: Payer: Self-pay

## 2023-10-01 NOTE — Telephone Encounter (Signed)
 Order sent to [facility].  Left detailed message on VM for Princess with [facility].  Nothing further needed at t his time.

## 2023-10-01 NOTE — Telephone Encounter (Signed)
 Copied from CRM [id number]. Topic: General - Other >> Sep 30, 2023  2:19 PM Nila Nephew wrote: Reason for CRM: Princess with [facility] calling to state that they are having issues having the oxygen filled through [facility]. [facility] states they have had challenges with almost all Oxygen companies. Payer source they have is the [facility] and they are requesting Dr. Vassie Loll request form a trusted company. States all patient needs is the portable unit. States the portable tank was not delivered at the discretion of the delivery driver for [facility] and her previous company is attempting to pick up the current unit she has.  Payer requesting investigation/expedite/resending of order so patient may receive portable oxygen unit ASAP. C/B of [phone] for [facility] - voicemail secure.    Please advise

## 2023-10-01 NOTE — Telephone Encounter (Signed)
 Copied from CRM [id number]. Topic: General - Other >> Oct 01, 2023 11:25 AM Adele Barthel wrote: Reason for CRM:   Calling: [facility] CB#: [phone]  Calling in regards to the order for new portable oxygen machine.   The order was supposed to be sent to [facility] due to past issues with [facility]. Patient is saying [facility] is calling her frequently, to attempt to pick up the machine she currently has, because they have not received new order. Princess suggests [facility] has either not gotten the order yet or is refusing to fill it.   Patient is unsure what to do, if she should keep the machine through [facility] or request the order be sent to [facility], who she prefers and has used in the past.   Requests call back

## 2023-10-03 ENCOUNTER — Other Ambulatory Visit: Payer: Self-pay | Admitting: Nurse Practitioner

## 2023-10-03 DIAGNOSIS — I5033 Acute on chronic diastolic (congestive) heart failure: Secondary | ICD-10-CM

## 2023-10-04 DIAGNOSIS — J449 Chronic obstructive pulmonary disease, unspecified: Secondary | ICD-10-CM | POA: Diagnosis not present

## 2023-10-05 ENCOUNTER — Telehealth: Payer: Self-pay | Admitting: Nurse Practitioner

## 2023-10-05 ENCOUNTER — Encounter (HOSPITAL_COMMUNITY): Payer: Medicare Other

## 2023-10-05 DIAGNOSIS — I480 Paroxysmal atrial fibrillation: Secondary | ICD-10-CM | POA: Diagnosis not present

## 2023-10-05 DIAGNOSIS — J309 Allergic rhinitis, unspecified: Secondary | ICD-10-CM | POA: Diagnosis not present

## 2023-10-05 DIAGNOSIS — I428 Other cardiomyopathies: Secondary | ICD-10-CM | POA: Diagnosis not present

## 2023-10-05 DIAGNOSIS — H34212 Partial retinal artery occlusion, left eye: Secondary | ICD-10-CM | POA: Diagnosis not present

## 2023-10-05 DIAGNOSIS — J441 Chronic obstructive pulmonary disease with (acute) exacerbation: Secondary | ICD-10-CM | POA: Diagnosis not present

## 2023-10-05 DIAGNOSIS — I5042 Chronic combined systolic (congestive) and diastolic (congestive) heart failure: Secondary | ICD-10-CM | POA: Diagnosis not present

## 2023-10-05 DIAGNOSIS — M8580 Other specified disorders of bone density and structure, unspecified site: Secondary | ICD-10-CM | POA: Diagnosis not present

## 2023-10-05 DIAGNOSIS — J9621 Acute and chronic respiratory failure with hypoxia: Secondary | ICD-10-CM | POA: Diagnosis not present

## 2023-10-05 DIAGNOSIS — I081 Rheumatic disorders of both mitral and tricuspid valves: Secondary | ICD-10-CM | POA: Diagnosis not present

## 2023-10-05 DIAGNOSIS — I2510 Atherosclerotic heart disease of native coronary artery without angina pectoris: Secondary | ICD-10-CM | POA: Diagnosis not present

## 2023-10-05 DIAGNOSIS — I712 Thoracic aortic aneurysm, without rupture, unspecified: Secondary | ICD-10-CM | POA: Diagnosis not present

## 2023-10-05 DIAGNOSIS — E785 Hyperlipidemia, unspecified: Secondary | ICD-10-CM | POA: Diagnosis not present

## 2023-10-05 DIAGNOSIS — Z7951 Long term (current) use of inhaled steroids: Secondary | ICD-10-CM | POA: Diagnosis not present

## 2023-10-05 DIAGNOSIS — I447 Left bundle-branch block, unspecified: Secondary | ICD-10-CM | POA: Diagnosis not present

## 2023-10-05 DIAGNOSIS — I110 Hypertensive heart disease with heart failure: Secondary | ICD-10-CM | POA: Diagnosis not present

## 2023-10-05 DIAGNOSIS — Z7901 Long term (current) use of anticoagulants: Secondary | ICD-10-CM | POA: Diagnosis not present

## 2023-10-05 NOTE — Telephone Encounter (Signed)
 She was admitted a few weeks ago for sepsis, pna, acute CHF exacerbation. Given worsening symptoms, needs in person evaluation. Recommend ED or UC if PCP can't see her unless we have an acute slot for her to be seen. Thanks.

## 2023-10-05 NOTE — Telephone Encounter (Signed)
 Pt is aware of below message/recommendations and voiced her understanding.  She will present to UC or ED.  Nothing further needed.

## 2023-10-05 NOTE — Telephone Encounter (Signed)
 Please advise if appropriate to continue

## 2023-10-05 NOTE — Telephone Encounter (Signed)
 Called and spoke to patient.  She stated that she produced mucus this morning that was mixed with specs of dark brown blood. She described this to look like coffee grounds.  She wears 4L cont. Spo2 dropped to 84% with exertion this morning but recovered to 94% with pursed lip breathing.  SOB has slightly worsened with exertion. Denied f/c/s or additional sx.  She is using albuterol 2-3x daily and Breztri BID.  Katie, please advise. Thanks

## 2023-10-05 NOTE — Telephone Encounter (Signed)
 Pt was seen by Dana Pruitt with [facility] & complains of a new symptom - productive cough w/ blood-looks like coffee grounds.  Please reach out to the patient at  [phone] to discuss options.    Thanks!

## 2023-10-06 ENCOUNTER — Telehealth: Payer: Self-pay

## 2023-10-06 DIAGNOSIS — Z7901 Long term (current) use of anticoagulants: Secondary | ICD-10-CM | POA: Diagnosis not present

## 2023-10-06 DIAGNOSIS — M8580 Other specified disorders of bone density and structure, unspecified site: Secondary | ICD-10-CM | POA: Diagnosis not present

## 2023-10-06 DIAGNOSIS — J441 Chronic obstructive pulmonary disease with (acute) exacerbation: Secondary | ICD-10-CM | POA: Diagnosis not present

## 2023-10-06 DIAGNOSIS — J9621 Acute and chronic respiratory failure with hypoxia: Secondary | ICD-10-CM | POA: Diagnosis not present

## 2023-10-06 DIAGNOSIS — E785 Hyperlipidemia, unspecified: Secondary | ICD-10-CM | POA: Diagnosis not present

## 2023-10-06 DIAGNOSIS — I428 Other cardiomyopathies: Secondary | ICD-10-CM | POA: Diagnosis not present

## 2023-10-06 DIAGNOSIS — H34212 Partial retinal artery occlusion, left eye: Secondary | ICD-10-CM | POA: Diagnosis not present

## 2023-10-06 DIAGNOSIS — I110 Hypertensive heart disease with heart failure: Secondary | ICD-10-CM | POA: Diagnosis not present

## 2023-10-06 DIAGNOSIS — I447 Left bundle-branch block, unspecified: Secondary | ICD-10-CM | POA: Diagnosis not present

## 2023-10-06 DIAGNOSIS — Z7951 Long term (current) use of inhaled steroids: Secondary | ICD-10-CM | POA: Diagnosis not present

## 2023-10-06 DIAGNOSIS — J309 Allergic rhinitis, unspecified: Secondary | ICD-10-CM | POA: Diagnosis not present

## 2023-10-06 DIAGNOSIS — I480 Paroxysmal atrial fibrillation: Secondary | ICD-10-CM | POA: Diagnosis not present

## 2023-10-06 DIAGNOSIS — I5042 Chronic combined systolic (congestive) and diastolic (congestive) heart failure: Secondary | ICD-10-CM | POA: Diagnosis not present

## 2023-10-06 DIAGNOSIS — I081 Rheumatic disorders of both mitral and tricuspid valves: Secondary | ICD-10-CM | POA: Diagnosis not present

## 2023-10-06 DIAGNOSIS — I2510 Atherosclerotic heart disease of native coronary artery without angina pectoris: Secondary | ICD-10-CM | POA: Diagnosis not present

## 2023-10-06 DIAGNOSIS — I712 Thoracic aortic aneurysm, without rupture, unspecified: Secondary | ICD-10-CM | POA: Diagnosis not present

## 2023-10-06 NOTE — Telephone Encounter (Signed)
 Spoke with patient and confirmed appointment on 4/17

## 2023-10-07 ENCOUNTER — Inpatient Hospital Stay: Admitting: Hematology and Oncology

## 2023-10-07 ENCOUNTER — Inpatient Hospital Stay: Attending: Internal Medicine

## 2023-10-07 ENCOUNTER — Encounter (HOSPITAL_COMMUNITY): Payer: Medicare Other

## 2023-10-07 VITALS — BP 113/53 | HR 80 | Temp 98.0°F | Resp 17 | Wt 119.1 lb

## 2023-10-07 DIAGNOSIS — I428 Other cardiomyopathies: Secondary | ICD-10-CM | POA: Diagnosis not present

## 2023-10-07 DIAGNOSIS — A419 Sepsis, unspecified organism: Secondary | ICD-10-CM | POA: Diagnosis not present

## 2023-10-07 DIAGNOSIS — J1000 Influenza due to other identified influenza virus with unspecified type of pneumonia: Secondary | ICD-10-CM | POA: Diagnosis not present

## 2023-10-07 DIAGNOSIS — Z87891 Personal history of nicotine dependence: Secondary | ICD-10-CM | POA: Insufficient documentation

## 2023-10-07 DIAGNOSIS — J441 Chronic obstructive pulmonary disease with (acute) exacerbation: Secondary | ICD-10-CM | POA: Diagnosis not present

## 2023-10-07 DIAGNOSIS — I480 Paroxysmal atrial fibrillation: Secondary | ICD-10-CM | POA: Diagnosis not present

## 2023-10-07 DIAGNOSIS — J9621 Acute and chronic respiratory failure with hypoxia: Secondary | ICD-10-CM | POA: Diagnosis not present

## 2023-10-07 DIAGNOSIS — J1008 Influenza due to other identified influenza virus with other specified pneumonia: Secondary | ICD-10-CM | POA: Diagnosis not present

## 2023-10-07 DIAGNOSIS — F32A Depression, unspecified: Secondary | ICD-10-CM | POA: Diagnosis not present

## 2023-10-07 DIAGNOSIS — J449 Chronic obstructive pulmonary disease, unspecified: Secondary | ICD-10-CM | POA: Insufficient documentation

## 2023-10-07 DIAGNOSIS — N390 Urinary tract infection, site not specified: Secondary | ICD-10-CM | POA: Diagnosis not present

## 2023-10-07 DIAGNOSIS — I2510 Atherosclerotic heart disease of native coronary artery without angina pectoris: Secondary | ICD-10-CM | POA: Diagnosis not present

## 2023-10-07 DIAGNOSIS — I2721 Secondary pulmonary arterial hypertension: Secondary | ICD-10-CM | POA: Diagnosis not present

## 2023-10-07 DIAGNOSIS — D509 Iron deficiency anemia, unspecified: Secondary | ICD-10-CM | POA: Insufficient documentation

## 2023-10-07 DIAGNOSIS — F419 Anxiety disorder, unspecified: Secondary | ICD-10-CM | POA: Diagnosis not present

## 2023-10-07 DIAGNOSIS — I110 Hypertensive heart disease with heart failure: Secondary | ICD-10-CM | POA: Diagnosis not present

## 2023-10-07 DIAGNOSIS — M62838 Other muscle spasm: Secondary | ICD-10-CM | POA: Diagnosis not present

## 2023-10-07 DIAGNOSIS — H34212 Partial retinal artery occlusion, left eye: Secondary | ICD-10-CM | POA: Diagnosis not present

## 2023-10-07 DIAGNOSIS — J159 Unspecified bacterial pneumonia: Secondary | ICD-10-CM | POA: Diagnosis not present

## 2023-10-07 DIAGNOSIS — D539 Nutritional anemia, unspecified: Secondary | ICD-10-CM

## 2023-10-07 DIAGNOSIS — I081 Rheumatic disorders of both mitral and tricuspid valves: Secondary | ICD-10-CM | POA: Diagnosis not present

## 2023-10-07 DIAGNOSIS — I447 Left bundle-branch block, unspecified: Secondary | ICD-10-CM | POA: Diagnosis not present

## 2023-10-07 DIAGNOSIS — C50411 Malignant neoplasm of upper-outer quadrant of right female breast: Secondary | ICD-10-CM

## 2023-10-07 DIAGNOSIS — Z809 Family history of malignant neoplasm, unspecified: Secondary | ICD-10-CM | POA: Diagnosis not present

## 2023-10-07 DIAGNOSIS — R300 Dysuria: Secondary | ICD-10-CM | POA: Diagnosis not present

## 2023-10-07 DIAGNOSIS — I712 Thoracic aortic aneurysm, without rupture, unspecified: Secondary | ICD-10-CM | POA: Diagnosis not present

## 2023-10-07 DIAGNOSIS — J440 Chronic obstructive pulmonary disease with acute lower respiratory infection: Secondary | ICD-10-CM | POA: Diagnosis not present

## 2023-10-07 DIAGNOSIS — E785 Hyperlipidemia, unspecified: Secondary | ICD-10-CM | POA: Diagnosis not present

## 2023-10-07 DIAGNOSIS — I2489 Other forms of acute ischemic heart disease: Secondary | ICD-10-CM | POA: Diagnosis not present

## 2023-10-07 DIAGNOSIS — Z9981 Dependence on supplemental oxygen: Secondary | ICD-10-CM | POA: Insufficient documentation

## 2023-10-07 DIAGNOSIS — M8580 Other specified disorders of bone density and structure, unspecified site: Secondary | ICD-10-CM | POA: Diagnosis not present

## 2023-10-07 DIAGNOSIS — I5042 Chronic combined systolic (congestive) and diastolic (congestive) heart failure: Secondary | ICD-10-CM | POA: Diagnosis not present

## 2023-10-07 DIAGNOSIS — J439 Emphysema, unspecified: Secondary | ICD-10-CM | POA: Diagnosis not present

## 2023-10-07 LAB — CBC WITH DIFFERENTIAL/PLATELET
Abs Immature Granulocytes: 0.09 10*3/uL — ABNORMAL HIGH (ref 0.00–0.07)
Basophils Absolute: 0 10*3/uL (ref 0.0–0.1)
Basophils Relative: 1 %
Eosinophils Absolute: 0.2 10*3/uL (ref 0.0–0.5)
Eosinophils Relative: 4 %
HCT: 28.2 % — ABNORMAL LOW (ref 36.0–46.0)
Hemoglobin: 9.1 g/dL — ABNORMAL LOW (ref 12.0–15.0)
Immature Granulocytes: 1 %
Lymphocytes Relative: 11 %
Lymphs Abs: 0.7 10*3/uL (ref 0.7–4.0)
MCH: 32.6 pg (ref 26.0–34.0)
MCHC: 32.3 g/dL (ref 30.0–36.0)
MCV: 101.1 fL — ABNORMAL HIGH (ref 80.0–100.0)
Monocytes Absolute: 0.3 10*3/uL (ref 0.1–1.0)
Monocytes Relative: 5 %
Neutro Abs: 4.9 10*3/uL (ref 1.7–7.7)
Neutrophils Relative %: 78 %
Platelets: 187 10*3/uL (ref 150–400)
RBC: 2.79 MIL/uL — ABNORMAL LOW (ref 3.87–5.11)
RDW: 16.9 % — ABNORMAL HIGH (ref 11.5–15.5)
WBC: 6.3 10*3/uL (ref 4.0–10.5)
nRBC: 0.6 % — ABNORMAL HIGH (ref 0.0–0.2)

## 2023-10-07 LAB — IRON AND IRON BINDING CAPACITY (CC-WL,HP ONLY)
Iron: 65 ug/dL (ref 28–170)
Saturation Ratios: 26 % (ref 10.4–31.8)
TIBC: 253 ug/dL (ref 250–450)
UIBC: 188 ug/dL (ref 148–442)

## 2023-10-07 LAB — FERRITIN: Ferritin: 103 ng/mL (ref 11–307)

## 2023-10-07 LAB — VITAMIN B12: Vitamin B-12: 1108 pg/mL — ABNORMAL HIGH (ref 180–914)

## 2023-10-07 NOTE — Telephone Encounter (Signed)
 This should be refilled by cardiology but I sent one refill. Thanks!

## 2023-10-07 NOTE — Assessment & Plan Note (Addendum)
  Assessment & Plan Iron Deficiency Anemia Hemoglobin decreased to 9.1 g/dL, likely due to recent hospitalization and infection. Tolerates one kind of iron supplement better.  Iron panel suggests improved iron stores, awaiting ferritin - B12 levels are high, no indication for further B12 supplementation. - We will await ferritin and discuss further recommendations.  Chronic Obstructive Pulmonary Disease (COPD) Moderate COPD worsened by recent infections, requiring continuous oxygen. Discussed potential oxygen weaning with pulmonologist. - Consult with pulmonologist Dr. Villa Greaser regarding COPD severity and potential to wean off oxygen therapy.  Pneumonia and Influenza A Recovering from recent infections. Normal white blood cell count with immature granulocytes indicating recovery.  Follow-up No suspicion of leukemia. Normal white blood cell count consistent with improving immature granulocytes suggesting infection recovery. - Schedule follow-up appointment in 3-4 months. - Call tomorrow with lab results and further recommendations.

## 2023-10-07 NOTE — Progress Notes (Signed)
 [facility] CONSULT NOTE  Patient Care Team: Imelda Man, MD as PCP - General Katheryne Pane Frederico Jan, MD as PCP - Cardiology (Cardiology) Homsher, Candice Chalet, RN as VBCI Care Management  CHIEF COMPLAINTS/PURPOSE OF CONSULTATION:  IDA  ASSESSMENT & PLAN:  This is a very pleasant 83 yr female pt with PMH significant for right-sided breast cancer, A-fib on anticoagulation, COPD, iron deficiency anemia referred to hematology for evaluation and recommendations regarding the anemia. Assessment & Plan Iron Deficiency Anemia  Hemoglobin decreased to 9.1 g/dL, likely due to recent infections.  -No evidence of iron, B12 deficiency -No evidence of MM on labs from Oct 2024 -No evidence of hypothyroidism. - Recommended BMB to rule out underlying bone marrow disorders. -Discussed the procedure with her, she is agreeable, CT biopsy ordered.  Chronic Obstructive Pulmonary Disease (COPD) Moderate COPD worsened by recent infections, requiring continuous oxygen . Discussed potential oxygen  weaning with pulmonologist. - Consult with pulmonologist Dr. Villa Greaser regarding COPD severity and potential to wean off oxygen  therapy.  Pneumonia and Influenza A Recovering from recent infections. Normal white blood cell count with immature granulocytes indicating recovery.  Follow-up No suspicion of leukemia. Normal white blood cell count consistent with infection recovery. - Schedule follow-up appointment in 4/5 weeks with me. - Call tomorrow with lab results and further recommendations.   HISTORY OF PRESENTING ILLNESS:  Dana Pruitt 83 y.o. female is here because of IDA.  Discussed the use of AI scribe software for clinical note transcription with the patient, who gave verbal consent to proceed.  History of Present Illness This is an 83 year old female with anemia and COPD who presents for follow-up of anemia and recent hospitalizations. She was referred by Dr. Lucina Sabal for evaluation of  anemia.  She has experienced recent hospitalizations due to pneumonia, influenza A, and COPD exacerbation. She was discharged twice, with the second admission occurring four days after the first discharge. She was not on oxygen  prior to these events but is now on continuous oxygen  therapy.  She has a history of iron deficiency anemia and was previously prescribed iron supplements. One type of iron pill caused significant constipation, while another did not. Her hemoglobin was 12.5 in February but has since dropped to 9.1. She is currently taking an iron pill, but the specific type is unclear. There was a past concern regarding potential leukemia, but her white blood cell count has remained normal.  She reports experiencing black stools, which she attributes to iron supplementation, and mentions that some blood was noted in her stool during a recent visit. She is awaiting results from recent blood work to assess her iron and B12 levels.   All other systems were reviewed with the patient and are negative.  MEDICAL HISTORY:  Past Medical History:  Diagnosis Date   Anemia    "several times over the years" (10/17/2012)   Anxiety    Breast cancer (HCC) 02/2010   right breast   CAD (coronary artery disease)    a. LHC 12/2018: mild non-obstructive CAD   Carcinoma of breast treated with adjuvant hormone therapy (HCC) 02/2010   Femara    Chronic systolic CHF    a. Echo in 2015: LVEF of 35-40%, b. Echo in 12/2021: LVEF of 60-65%   COPD (chronic obstructive pulmonary disease) (HCC)    Depression    GERD (gastroesophageal reflux disease)    diet controlled, no med   Hollenhorst plaque, left eye    apparently resolved at recent ophthalmology visit   Hyperlipidemia  Hypertension    IDC, RightStage II, 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   Moderate aortic insufficiency    Non-ischemic cardiomyopathy (HCC)    Paroxysmal atrial fibrillation (HCC)    Pneumonia    "that's why I'm here" (10/17/2012)   S/P AVR  (aortic valve replacement) 04/26/2019   Stroke (HCC)    12/2021    SURGICAL HISTORY: Past Surgical History:  Procedure Laterality Date   AORTIC VALVE REPLACEMENT N/A 01/31/2019   Procedure: AORTIC VALVE REPLACEMENT (AVR) USING 21 MM INSPIRIS RESILIS AORTIC VALVE. SN: [id number];  Surgeon: Matt Song, Donata Fryer, MD;  Location: [facility] OR;  Service: Open Heart Surgery;  Laterality: N/A;   APPENDECTOMY  1974   BREAST BIOPSY Right 02/27/10   Needle core Biopsy; Invasive Mammary; ER/PR Positive, Her-2 Neu negative, Ki-67 22%   BREAST LUMPECTOMY WITH SENTINEL LYMPH NODE BIOPSY Right 07/03/11   Invasive Ductal Carcinoma;0/3 nodes negative,; ER,PR Positive, Her-2 Neg; Ki-67 22%   CESAREAN SECTION  1970; 1974   COLONOSCOPY     DILATION AND CURETTAGE OF UTERUS  1970's   RIGHT HEART CATH AND CORONARY ANGIOGRAPHY N/A 08/31/2023   Procedure: RIGHT HEART CATH AND CORONARY ANGIOGRAPHY;  Surgeon: Arnoldo Lapping, MD;  Location: [facility] INVASIVE CV LAB;  Service: Cardiovascular;  Laterality: N/A;   RIGHT/LEFT HEART CATH AND CORONARY ANGIOGRAPHY N/A 01/02/2019   Procedure: RIGHT/LEFT HEART CATH AND CORONARY ANGIOGRAPHY;  Surgeon: Darlis Eisenmenger, MD;  Location: [facility] INVASIVE CV LAB;  Service: Cardiovascular;  Laterality: N/A;   TEE WITHOUT CARDIOVERSION N/A 01/31/2019   Procedure: TRANSESOPHAGEAL ECHOCARDIOGRAM (TEE);  Surgeon: Matt Song, Donata Fryer, MD;  Location: [facility] OR;  Service: Open Heart Surgery;  Laterality: N/A;   TUBAL LIGATION  1974   WISDOM TOOTH EXTRACTION      SOCIAL HISTORY: Social History   Socioeconomic History   Marital status: Married    Spouse name: Not on file   Number of children: 2   Years of education: college   Highest education level: Bachelor's degree (e.g., BA, AB, BS)  Occupational History    Employer: RETIRED  Tobacco Use   Smoking status: Former    Current packs/day: 0.00    Average packs/day: 3.0 packs/day for 32.1 years (96.3 ttl pk-yrs)    Types: Cigarettes    Start date: 05/19/1958     Quit date: 06/30/1990    Years since quitting: 33.2   Smokeless tobacco: Never   Tobacco comments:    Stopped smoking for 2-3 years during second child.   Vaping Use   Vaping status: Never Used  Substance and Sexual Activity   Alcohol use: Not Currently   Drug use: No   Sexual activity: Yes    Birth control/protection: Post-menopausal    Comment: Menarche age 16, G65,P2, Menopause in 40"s, No HRT Married  Other Topics Concern   Not on file  Social History Narrative   [facility] (12/29/16):   Originally from [place], [place]. Moved to [place] at 83 y.o. Does have a dog. Hasn't worked outside the home. Remote parakeet exposure from her children. No mold or hot tub exposure. Enjoys doing yard work & walking.    Social Drivers of Corporate investment banker Strain: Not on file  Food Insecurity: No Food Insecurity (09/23/2023)   Hunger Vital Sign    Worried About Running Out of Food in the Last Year: Never true    Ran Out of Food in the Last Year: Never true  Transportation Needs: No Transportation Needs (09/23/2023)   PRAPARE - Transportation  Lack of Transportation (Medical): No    Lack of Transportation (Non-Medical): No  Physical Activity: Not on file  Stress: Not on file  Social Connections: Moderately Integrated (09/14/2023)   Social Connection and Isolation Panel [NHANES]    Frequency of Communication with Friends and Family: More than three times a week    Frequency of Social Gatherings with Friends and Family: Twice a week    Attends Religious Services: Never    Database administrator or Organizations: Yes    Attends Engineer, structural: More than 4 times per year    Marital Status: Married  Catering manager Violence: Not At Risk (09/23/2023)   Humiliation, Afraid, Rape, and Kick questionnaire    Fear of Current or Ex-Partner: No    Emotionally Abused: No    Physically Abused: No    Sexually Abused: No    FAMILY HISTORY: Family History  Problem Relation Age of  Onset   Heart disease Mother    Cancer Father    Heart disease Maternal Grandmother    Heart disease Maternal Grandfather    Cancer Paternal Grandmother    Lung disease Neg Hx     ALLERGIES:  has no known allergies.  MEDICATIONS:  Current Outpatient Medications  Medication Sig Dispense Refill   albuterol  (PROAIR  HFA) 108 (90 Base) MCG/ACT inhaler 2 puffs every 4 hours as needed only  if your can't catch your breath 1 each 6   albuterol  (PROVENTIL ) (2.5 MG/3ML) 0.083% nebulizer solution Take 3 mLs (2.5 mg total) by nebulization every 6 (six) hours as needed for wheezing or shortness of breath. 75 mL 5   amiodarone  (PACERONE ) 200 MG tablet Take 1 tablet (200 mg total) by mouth daily. 90 tablet 3   atorvastatin  (LIPITOR) 20 MG tablet Take 20 mg by mouth daily.      bisoprolol  (ZEBETA ) 5 MG tablet Take 0.5 tablets (2.5 mg total) by mouth daily. 15 tablet 3   BREZTRI  AEROSPHERE 160-9-4.8 MCG/ACT AERO INHALE 2 PUFFS INTO THE LUNGS TWICE DAILY 10.7 g 11   Cholecalciferol  (VITAMIN D3) 25 MCG (1000 UT) CAPS Take 1,000 Units by mouth daily.     ELIQUIS  2.5 MG TABS tablet TAKE 1 TABLET(2.5 MG) BY MOUTH TWICE DAILY 180 tablet 1   ezetimibe  (ZETIA ) 10 MG tablet Take 10 mg by mouth daily.     FEROSUL 325 (65 Fe) MG tablet Take 325 mg by mouth every other day.     fluticasone  (FLONASE ) 50 MCG/ACT nasal spray SHAKE LIQUID AND USE 2 SPRAYS IN EACH NOSTRIL DAILY 16 g 3   guaiFENesin  (MUCINEX ) 600 MG 12 hr tablet Take 1 tablet (600 mg total) by mouth 2 (two) times daily. 30 tablet 0   guaiFENesin -dextromethorphan  (ROBITUSSIN DM) 100-10 MG/5ML syrup Take 5 mLs by mouth every 4 (four) hours as needed for cough. 118 mL 0   ipratropium (ATROVENT ) 0.06 % nasal spray Place 2 sprays into both nostrils 4 (four) times daily. 15 mL 12   losartan  (COZAAR ) 25 MG tablet Take 25 mg by mouth daily.     OXYGEN  Inhale 4 L/min into the lungs continuous.     predniSONE  (DELTASONE ) 10 MG tablet Take by mouth: 4 tabs daily  for two days; then 3 tabs daily for two days; then 2 tabs daily for two days; then 1 tab daily for two days, then stop. 20 tablet 0   sertraline  (ZOLOFT ) 100 MG tablet Take 200 mg by mouth daily.     valACYclovir (VALTREX)  1000 MG tablet Take 500 mg by mouth as needed.     No current facility-administered medications for this visit.   Facility-Administered Medications Ordered in Other Visits  Medication Dose Route Frequency Provider Last Rate Last Admin   sodium chloride  flush (NS) 0.9 % injection 3 mL  3 mL Intravenous Q12H Darlis Eisenmenger, MD         PHYSICAL EXAMINATION: ECOG PERFORMANCE STATUS: 1 - Symptomatic but completely ambulatory  Vitals:   10/07/23 1256  BP: (!) 113/53  Pulse: 80  Resp: 17  Temp: 98 F (36.7 C)  SpO2: 94%   Filed Weights   10/07/23 1256  Weight: 119 lb 1.6 oz (54 kg)    GENERAL:alert, no distress and comfortable Chest: CTA bilaterally. No adv sounds. Heart: RRR Abdomen: soft, non tender, non distended, no organomegaly No LE edema.  LABORATORY DATA:  I have reviewed the data as listed Lab Results  Component Value Date   WBC 6.3 10/07/2023   HGB 9.1 (L) 10/07/2023   HCT 28.2 (L) 10/07/2023   MCV 101.1 (H) 10/07/2023   PLT 187 10/07/2023     Chemistry      Component Value Date/Time   NA 135 09/14/2023 0817   NA 136 09/09/2023 1659   NA 138 11/27/2014 1017   K 3.5 09/14/2023 0817   K 4.9 11/27/2014 1017   CL 100 09/14/2023 0817   CL 105 03/31/2012 1410   CO2 25 09/14/2023 0817   CO2 25 11/27/2014 1017   BUN 18 09/14/2023 0817   BUN 22 09/09/2023 1659   BUN 18.7 11/27/2014 1017   CREATININE 1.00 09/14/2023 0817   CREATININE 1.19 (H) 03/23/2023 1117   CREATININE 1.0 11/27/2014 1017      Component Value Date/Time   CALCIUM  8.6 (L) 09/14/2023 0817   CALCIUM  10.1 11/27/2014 1017   ALKPHOS 43 09/13/2023 1534   ALKPHOS 35 (L) 11/27/2014 1017   AST 25 09/13/2023 1534   AST 16 03/23/2023 1117   AST 19 11/27/2014 1017   ALT 28  09/13/2023 1534   ALT 12 03/23/2023 1117   ALT 12 11/27/2014 1017   BILITOT 1.1 09/13/2023 1534   BILITOT 0.2 09/09/2023 1659   BILITOT 0.6 03/23/2023 1117   BILITOT 0.45 11/27/2014 1017       RADIOGRAPHIC STUDIES: I have personally reviewed the radiological images as listed and agreed with the findings in the report. CT Angio Chest PE W and/or Wo Contrast Result Date: 09/13/2023 CLINICAL DATA:  Pulmonary embolism suspected, high probability. EXAM: CT ANGIOGRAPHY CHEST WITH CONTRAST TECHNIQUE: Multidetector CT imaging of the chest was performed using the standard protocol during bolus administration of intravenous contrast. Multiplanar CT image reconstructions and MIPs were obtained to evaluate the vascular anatomy. RADIATION DOSE REDUCTION: This exam was performed according to the departmental dose-optimization program which includes automated exposure control, adjustment of the mA and/or kV according to patient size and/or use of iterative reconstruction technique. CONTRAST:  75mL OMNIPAQUE  IOHEXOL  350 MG/ML SOLN COMPARISON:  11/04/2022. FINDINGS: Cardiovascular: The heart is enlarged and there is no pericardial effusion. Three-vessel coronary artery calcifications are noted. There is atherosclerotic calcification of the aorta with aneurysmal dilatation of the ascending aorta measuring 4.5 cm. The pulmonary trunk is distended measuring 4.3 cm. No evidence of pulmonary embolism is seen. Mediastinum/Nodes: No enlarged mediastinal, hilar, or axillary lymph nodes. Thyroid  gland, trachea, and esophagus demonstrate no significant findings. Lungs/Pleura: Paraseptal and centrilobular emphysematous changes are present in the lungs. Biapical pleural scarring is  noted. There is bronchiectasis bilaterally with consolidation in the lingular segment of the left upper lobe and the medial aspect of the left lower lobe. Fibrotic changes are noted in the anterior aspect of the right upper lobe. No effusion or  pneumothorax is seen. A few scattered pulmonary nodules are noted in the right upper lobe measuring up to 3 mm, not significantly changed from the prior exam. No new nodule is seen. Upper Abdomen: Stones are present within the gallbladder. No acute abnormality. Musculoskeletal: Sternotomy wires are noted. Degenerative changes are present in the thoracic spine. Review of the MIP images confirms the above findings. IMPRESSION: 1. No evidence of pulmonary embolism. 2. Focal consolidation in the left upper and lower lobes, possible atelectasis or infiltrate. 3. Emphysema. 4. Cardiomegaly with coronary artery calcifications. 5. Distended pulmonary trunk which may be associated with pulmonary artery hypertension. 6. Aortic atherosclerosis with aneurysmal dilatation of the ascending aorta measuring 4.5 cm. Ascending thoracic aortic aneurysm. Recommend semi-annual imaging followup by CTA or MRA and referral to cardiothoracic surgery if not already obtained. This recommendation follows 2010 ACCF/AHA/AATS/ACR/ASA/SCA/SCAI/SIR/STS/SVM Guidelines for the Diagnosis and Management of Patients With Thoracic Aortic Disease. Circulation. 2010; 121: Z610-R604. Aortic aneurysm NOS (ICD10-I71.9) Electronically Signed   By: Wyvonnia Heimlich M.D.   On: 09/13/2023 21:12   DG Chest Port 1 View Result Date: 09/13/2023 CLINICAL DATA:  Possible sepsis EXAM: PORTABLE CHEST 1 VIEW COMPARISON:  08/30/2023, 08/16/2023, CT chest 11/04/2022 FINDINGS: Emphysema. Increased interstitial opacities and patchy peripheral left lower lung opacity compared to prior. Sternotomy and valve prosthesis. Stable cardiomediastinal silhouette with aortic atherosclerosis. No pneumothorax IMPRESSION: Emphysema. Increased interstitial opacities and patchy peripheral left lower lung opacity compared to prior, possible pneumonia. Electronically Signed   By: Esmeralda Hedge M.D.   On: 09/13/2023 18:06    All questions were answered. The patient knows to call the clinic  with any problems, questions or concerns. I spent 30 minutes in the care of this patient including H and P, review of records, counseling and coordination of care.     Murleen Arms, MD 10/07/2023 4:31 PM

## 2023-10-08 ENCOUNTER — Telehealth: Payer: Self-pay | Admitting: Hematology and Oncology

## 2023-10-08 ENCOUNTER — Other Ambulatory Visit: Payer: Self-pay | Admitting: Hematology and Oncology

## 2023-10-08 ENCOUNTER — Encounter: Payer: Self-pay | Admitting: Oncology

## 2023-10-08 DIAGNOSIS — I2489 Other forms of acute ischemic heart disease: Secondary | ICD-10-CM | POA: Diagnosis not present

## 2023-10-08 DIAGNOSIS — A419 Sepsis, unspecified organism: Secondary | ICD-10-CM | POA: Diagnosis not present

## 2023-10-08 DIAGNOSIS — I110 Hypertensive heart disease with heart failure: Secondary | ICD-10-CM | POA: Diagnosis not present

## 2023-10-08 DIAGNOSIS — I2510 Atherosclerotic heart disease of native coronary artery without angina pectoris: Secondary | ICD-10-CM | POA: Diagnosis not present

## 2023-10-08 DIAGNOSIS — J9621 Acute and chronic respiratory failure with hypoxia: Secondary | ICD-10-CM | POA: Diagnosis not present

## 2023-10-08 DIAGNOSIS — I712 Thoracic aortic aneurysm, without rupture, unspecified: Secondary | ICD-10-CM | POA: Diagnosis not present

## 2023-10-08 DIAGNOSIS — I428 Other cardiomyopathies: Secondary | ICD-10-CM | POA: Diagnosis not present

## 2023-10-08 DIAGNOSIS — J441 Chronic obstructive pulmonary disease with (acute) exacerbation: Secondary | ICD-10-CM | POA: Diagnosis not present

## 2023-10-08 DIAGNOSIS — N390 Urinary tract infection, site not specified: Secondary | ICD-10-CM | POA: Diagnosis not present

## 2023-10-08 DIAGNOSIS — J439 Emphysema, unspecified: Secondary | ICD-10-CM | POA: Diagnosis not present

## 2023-10-08 DIAGNOSIS — I2721 Secondary pulmonary arterial hypertension: Secondary | ICD-10-CM | POA: Diagnosis not present

## 2023-10-08 DIAGNOSIS — I5042 Chronic combined systolic (congestive) and diastolic (congestive) heart failure: Secondary | ICD-10-CM | POA: Diagnosis not present

## 2023-10-08 DIAGNOSIS — J440 Chronic obstructive pulmonary disease with acute lower respiratory infection: Secondary | ICD-10-CM | POA: Diagnosis not present

## 2023-10-08 DIAGNOSIS — I480 Paroxysmal atrial fibrillation: Secondary | ICD-10-CM | POA: Diagnosis not present

## 2023-10-08 DIAGNOSIS — J1008 Influenza due to other identified influenza virus with other specified pneumonia: Secondary | ICD-10-CM | POA: Diagnosis not present

## 2023-10-08 DIAGNOSIS — J159 Unspecified bacterial pneumonia: Secondary | ICD-10-CM | POA: Diagnosis not present

## 2023-10-08 NOTE — Telephone Encounter (Signed)
 Patient scheduled appointments. Patient is aware of all appointment details.

## 2023-10-08 NOTE — Telephone Encounter (Signed)
 Spoke with patient confirming upcoming appointment

## 2023-10-11 ENCOUNTER — Other Ambulatory Visit: Payer: Self-pay

## 2023-10-11 DIAGNOSIS — N390 Urinary tract infection, site not specified: Secondary | ICD-10-CM | POA: Diagnosis not present

## 2023-10-11 DIAGNOSIS — I110 Hypertensive heart disease with heart failure: Secondary | ICD-10-CM | POA: Diagnosis not present

## 2023-10-11 DIAGNOSIS — I2510 Atherosclerotic heart disease of native coronary artery without angina pectoris: Secondary | ICD-10-CM | POA: Diagnosis not present

## 2023-10-11 DIAGNOSIS — A419 Sepsis, unspecified organism: Secondary | ICD-10-CM | POA: Diagnosis not present

## 2023-10-11 DIAGNOSIS — I712 Thoracic aortic aneurysm, without rupture, unspecified: Secondary | ICD-10-CM | POA: Diagnosis not present

## 2023-10-11 DIAGNOSIS — J1008 Influenza due to other identified influenza virus with other specified pneumonia: Secondary | ICD-10-CM | POA: Diagnosis not present

## 2023-10-11 DIAGNOSIS — I480 Paroxysmal atrial fibrillation: Secondary | ICD-10-CM | POA: Diagnosis not present

## 2023-10-11 DIAGNOSIS — I2489 Other forms of acute ischemic heart disease: Secondary | ICD-10-CM | POA: Diagnosis not present

## 2023-10-11 DIAGNOSIS — I2721 Secondary pulmonary arterial hypertension: Secondary | ICD-10-CM | POA: Diagnosis not present

## 2023-10-11 DIAGNOSIS — J441 Chronic obstructive pulmonary disease with (acute) exacerbation: Secondary | ICD-10-CM | POA: Diagnosis not present

## 2023-10-11 DIAGNOSIS — J440 Chronic obstructive pulmonary disease with acute lower respiratory infection: Secondary | ICD-10-CM | POA: Diagnosis not present

## 2023-10-11 DIAGNOSIS — J159 Unspecified bacterial pneumonia: Secondary | ICD-10-CM | POA: Diagnosis not present

## 2023-10-11 DIAGNOSIS — I5042 Chronic combined systolic (congestive) and diastolic (congestive) heart failure: Secondary | ICD-10-CM | POA: Diagnosis not present

## 2023-10-11 DIAGNOSIS — I428 Other cardiomyopathies: Secondary | ICD-10-CM | POA: Diagnosis not present

## 2023-10-11 DIAGNOSIS — J439 Emphysema, unspecified: Secondary | ICD-10-CM | POA: Diagnosis not present

## 2023-10-11 DIAGNOSIS — J9621 Acute and chronic respiratory failure with hypoxia: Secondary | ICD-10-CM | POA: Diagnosis not present

## 2023-10-11 MED ORDER — APIXABAN 2.5 MG PO TABS: 2.5000 mg | ORAL_TABLET | Freq: Two times a day (BID) | ORAL | 1 refills | Status: DC

## 2023-10-11 NOTE — Telephone Encounter (Signed)
 Prescription refill request for Eliquis  received. Indication:afib Last office visit:4/25 Scr:1.00  3/25 Age: 83 Weight:54  kg  Prescription refilled

## 2023-10-12 ENCOUNTER — Encounter (HOSPITAL_COMMUNITY): Payer: Medicare Other

## 2023-10-12 DIAGNOSIS — J1008 Influenza due to other identified influenza virus with other specified pneumonia: Secondary | ICD-10-CM | POA: Diagnosis not present

## 2023-10-12 DIAGNOSIS — I2510 Atherosclerotic heart disease of native coronary artery without angina pectoris: Secondary | ICD-10-CM | POA: Diagnosis not present

## 2023-10-12 DIAGNOSIS — I480 Paroxysmal atrial fibrillation: Secondary | ICD-10-CM | POA: Diagnosis not present

## 2023-10-12 DIAGNOSIS — A419 Sepsis, unspecified organism: Secondary | ICD-10-CM | POA: Diagnosis not present

## 2023-10-12 DIAGNOSIS — J440 Chronic obstructive pulmonary disease with acute lower respiratory infection: Secondary | ICD-10-CM | POA: Diagnosis not present

## 2023-10-12 DIAGNOSIS — N390 Urinary tract infection, site not specified: Secondary | ICD-10-CM | POA: Diagnosis not present

## 2023-10-12 DIAGNOSIS — J9621 Acute and chronic respiratory failure with hypoxia: Secondary | ICD-10-CM | POA: Diagnosis not present

## 2023-10-12 DIAGNOSIS — I2489 Other forms of acute ischemic heart disease: Secondary | ICD-10-CM | POA: Diagnosis not present

## 2023-10-12 DIAGNOSIS — J441 Chronic obstructive pulmonary disease with (acute) exacerbation: Secondary | ICD-10-CM | POA: Diagnosis not present

## 2023-10-12 DIAGNOSIS — I110 Hypertensive heart disease with heart failure: Secondary | ICD-10-CM | POA: Diagnosis not present

## 2023-10-12 DIAGNOSIS — I5042 Chronic combined systolic (congestive) and diastolic (congestive) heart failure: Secondary | ICD-10-CM | POA: Diagnosis not present

## 2023-10-12 DIAGNOSIS — J439 Emphysema, unspecified: Secondary | ICD-10-CM | POA: Diagnosis not present

## 2023-10-12 DIAGNOSIS — J159 Unspecified bacterial pneumonia: Secondary | ICD-10-CM | POA: Diagnosis not present

## 2023-10-12 DIAGNOSIS — I428 Other cardiomyopathies: Secondary | ICD-10-CM | POA: Diagnosis not present

## 2023-10-12 DIAGNOSIS — I712 Thoracic aortic aneurysm, without rupture, unspecified: Secondary | ICD-10-CM | POA: Diagnosis not present

## 2023-10-12 DIAGNOSIS — I2721 Secondary pulmonary arterial hypertension: Secondary | ICD-10-CM | POA: Diagnosis not present

## 2023-10-14 ENCOUNTER — Encounter (HOSPITAL_COMMUNITY): Payer: Medicare Other

## 2023-10-18 DIAGNOSIS — I2510 Atherosclerotic heart disease of native coronary artery without angina pectoris: Secondary | ICD-10-CM | POA: Diagnosis not present

## 2023-10-18 DIAGNOSIS — J440 Chronic obstructive pulmonary disease with acute lower respiratory infection: Secondary | ICD-10-CM | POA: Diagnosis not present

## 2023-10-18 DIAGNOSIS — I110 Hypertensive heart disease with heart failure: Secondary | ICD-10-CM | POA: Diagnosis not present

## 2023-10-18 DIAGNOSIS — A419 Sepsis, unspecified organism: Secondary | ICD-10-CM | POA: Diagnosis not present

## 2023-10-18 DIAGNOSIS — J1008 Influenza due to other identified influenza virus with other specified pneumonia: Secondary | ICD-10-CM | POA: Diagnosis not present

## 2023-10-18 DIAGNOSIS — J439 Emphysema, unspecified: Secondary | ICD-10-CM | POA: Diagnosis not present

## 2023-10-18 DIAGNOSIS — I480 Paroxysmal atrial fibrillation: Secondary | ICD-10-CM | POA: Diagnosis not present

## 2023-10-18 DIAGNOSIS — I428 Other cardiomyopathies: Secondary | ICD-10-CM | POA: Diagnosis not present

## 2023-10-18 DIAGNOSIS — J9621 Acute and chronic respiratory failure with hypoxia: Secondary | ICD-10-CM | POA: Diagnosis not present

## 2023-10-18 DIAGNOSIS — I712 Thoracic aortic aneurysm, without rupture, unspecified: Secondary | ICD-10-CM | POA: Diagnosis not present

## 2023-10-18 DIAGNOSIS — N390 Urinary tract infection, site not specified: Secondary | ICD-10-CM | POA: Diagnosis not present

## 2023-10-18 DIAGNOSIS — I2721 Secondary pulmonary arterial hypertension: Secondary | ICD-10-CM | POA: Diagnosis not present

## 2023-10-18 DIAGNOSIS — J159 Unspecified bacterial pneumonia: Secondary | ICD-10-CM | POA: Diagnosis not present

## 2023-10-18 DIAGNOSIS — J441 Chronic obstructive pulmonary disease with (acute) exacerbation: Secondary | ICD-10-CM | POA: Diagnosis not present

## 2023-10-18 DIAGNOSIS — I2489 Other forms of acute ischemic heart disease: Secondary | ICD-10-CM | POA: Diagnosis not present

## 2023-10-18 DIAGNOSIS — I5042 Chronic combined systolic (congestive) and diastolic (congestive) heart failure: Secondary | ICD-10-CM | POA: Diagnosis not present

## 2023-10-19 ENCOUNTER — Encounter (HOSPITAL_COMMUNITY): Payer: Medicare Other

## 2023-10-21 ENCOUNTER — Emergency Department (HOSPITAL_COMMUNITY)

## 2023-10-21 ENCOUNTER — Inpatient Hospital Stay (HOSPITAL_COMMUNITY)
Admission: EM | Admit: 2023-10-21 | Discharge: 2023-10-25 | DRG: 190 | Disposition: A | Discharge status: Home Health | Attending: Obstetrics and Gynecology | Admitting: Obstetrics and Gynecology

## 2023-10-21 ENCOUNTER — Encounter (HOSPITAL_COMMUNITY): Payer: Self-pay

## 2023-10-21 ENCOUNTER — Encounter (HOSPITAL_COMMUNITY): Payer: Medicare Other

## 2023-10-21 ENCOUNTER — Other Ambulatory Visit: Payer: Self-pay

## 2023-10-21 DIAGNOSIS — F419 Anxiety disorder, unspecified: Secondary | ICD-10-CM | POA: Diagnosis present

## 2023-10-21 DIAGNOSIS — J449 Chronic obstructive pulmonary disease, unspecified: Secondary | ICD-10-CM | POA: Diagnosis present

## 2023-10-21 DIAGNOSIS — E785 Hyperlipidemia, unspecified: Secondary | ICD-10-CM | POA: Diagnosis not present

## 2023-10-21 DIAGNOSIS — Z87891 Personal history of nicotine dependence: Secondary | ICD-10-CM | POA: Diagnosis not present

## 2023-10-21 DIAGNOSIS — J441 Chronic obstructive pulmonary disease with (acute) exacerbation: Secondary | ICD-10-CM | POA: Diagnosis not present

## 2023-10-21 DIAGNOSIS — Z8673 Personal history of transient ischemic attack (TIA), and cerebral infarction without residual deficits: Secondary | ICD-10-CM

## 2023-10-21 DIAGNOSIS — I7121 Aneurysm of the ascending aorta, without rupture: Secondary | ICD-10-CM | POA: Diagnosis present

## 2023-10-21 DIAGNOSIS — J479 Bronchiectasis, uncomplicated: Secondary | ICD-10-CM | POA: Diagnosis present

## 2023-10-21 DIAGNOSIS — F32A Depression, unspecified: Secondary | ICD-10-CM | POA: Diagnosis present

## 2023-10-21 DIAGNOSIS — Z952 Presence of prosthetic heart valve: Secondary | ICD-10-CM | POA: Diagnosis not present

## 2023-10-21 DIAGNOSIS — J439 Emphysema, unspecified: Secondary | ICD-10-CM | POA: Diagnosis not present

## 2023-10-21 DIAGNOSIS — K219 Gastro-esophageal reflux disease without esophagitis: Secondary | ICD-10-CM | POA: Diagnosis present

## 2023-10-21 DIAGNOSIS — R531 Weakness: Secondary | ICD-10-CM | POA: Diagnosis not present

## 2023-10-21 DIAGNOSIS — Z1152 Encounter for screening for COVID-19: Secondary | ICD-10-CM | POA: Diagnosis not present

## 2023-10-21 DIAGNOSIS — J432 Centrilobular emphysema: Secondary | ICD-10-CM | POA: Diagnosis not present

## 2023-10-21 DIAGNOSIS — Z6820 Body mass index (BMI) 20.0-20.9, adult: Secondary | ICD-10-CM

## 2023-10-21 DIAGNOSIS — E43 Unspecified severe protein-calorie malnutrition: Secondary | ICD-10-CM | POA: Diagnosis not present

## 2023-10-21 DIAGNOSIS — Z8249 Family history of ischemic heart disease and other diseases of the circulatory system: Secondary | ICD-10-CM

## 2023-10-21 DIAGNOSIS — E876 Hypokalemia: Secondary | ICD-10-CM | POA: Diagnosis not present

## 2023-10-21 DIAGNOSIS — Z853 Personal history of malignant neoplasm of breast: Secondary | ICD-10-CM

## 2023-10-21 DIAGNOSIS — I110 Hypertensive heart disease with heart failure: Secondary | ICD-10-CM | POA: Diagnosis not present

## 2023-10-21 DIAGNOSIS — I517 Cardiomegaly: Secondary | ICD-10-CM | POA: Diagnosis not present

## 2023-10-21 DIAGNOSIS — I428 Other cardiomyopathies: Secondary | ICD-10-CM | POA: Diagnosis not present

## 2023-10-21 DIAGNOSIS — I272 Pulmonary hypertension, unspecified: Secondary | ICD-10-CM | POA: Diagnosis present

## 2023-10-21 DIAGNOSIS — I480 Paroxysmal atrial fibrillation: Secondary | ICD-10-CM | POA: Diagnosis present

## 2023-10-21 DIAGNOSIS — I5022 Chronic systolic (congestive) heart failure: Secondary | ICD-10-CM | POA: Diagnosis present

## 2023-10-21 DIAGNOSIS — R0902 Hypoxemia: Secondary | ICD-10-CM

## 2023-10-21 DIAGNOSIS — R918 Other nonspecific abnormal finding of lung field: Secondary | ICD-10-CM | POA: Diagnosis not present

## 2023-10-21 DIAGNOSIS — Z9981 Dependence on supplemental oxygen: Secondary | ICD-10-CM | POA: Diagnosis not present

## 2023-10-21 DIAGNOSIS — J9621 Acute and chronic respiratory failure with hypoxia: Secondary | ICD-10-CM | POA: Diagnosis present

## 2023-10-21 DIAGNOSIS — R140 Abdominal distension (gaseous): Secondary | ICD-10-CM | POA: Diagnosis not present

## 2023-10-21 DIAGNOSIS — I509 Heart failure, unspecified: Secondary | ICD-10-CM | POA: Diagnosis not present

## 2023-10-21 DIAGNOSIS — I5021 Acute systolic (congestive) heart failure: Secondary | ICD-10-CM | POA: Diagnosis not present

## 2023-10-21 DIAGNOSIS — I10 Essential (primary) hypertension: Secondary | ICD-10-CM | POA: Diagnosis present

## 2023-10-21 DIAGNOSIS — Z9851 Tubal ligation status: Secondary | ICD-10-CM

## 2023-10-21 DIAGNOSIS — Z79899 Other long term (current) drug therapy: Secondary | ICD-10-CM

## 2023-10-21 DIAGNOSIS — I2510 Atherosclerotic heart disease of native coronary artery without angina pectoris: Secondary | ICD-10-CM | POA: Diagnosis present

## 2023-10-21 DIAGNOSIS — J189 Pneumonia, unspecified organism: Secondary | ICD-10-CM | POA: Diagnosis not present

## 2023-10-21 DIAGNOSIS — Z9049 Acquired absence of other specified parts of digestive tract: Secondary | ICD-10-CM

## 2023-10-21 DIAGNOSIS — J984 Other disorders of lung: Secondary | ICD-10-CM | POA: Diagnosis not present

## 2023-10-21 DIAGNOSIS — Z7901 Long term (current) use of anticoagulants: Secondary | ICD-10-CM

## 2023-10-21 DIAGNOSIS — R0602 Shortness of breath: Secondary | ICD-10-CM | POA: Diagnosis not present

## 2023-10-21 LAB — COMPREHENSIVE METABOLIC PANEL WITH GFR
ALT: 14 U/L (ref 0–44)
AST: 22 U/L (ref 15–41)
Albumin: 3.2 g/dL — ABNORMAL LOW (ref 3.5–5.0)
Alkaline Phosphatase: 46 U/L (ref 38–126)
Anion gap: 12 (ref 5–15)
BUN: 17 mg/dL (ref 8–23)
CO2: 25 mmol/L (ref 22–32)
Calcium: 9.7 mg/dL (ref 8.9–10.3)
Chloride: 99 mmol/L (ref 98–111)
Creatinine, Ser: 1.27 mg/dL — ABNORMAL HIGH (ref 0.44–1.00)
GFR, Estimated: 42 mL/min — ABNORMAL LOW (ref >60)
Glucose, Bld: 141 mg/dL — ABNORMAL HIGH (ref 70–99)
Potassium: 3.8 mmol/L (ref 3.5–5.1)
Sodium: 136 mmol/L (ref 135–145)
Total Bilirubin: 0.8 mg/dL (ref 0.0–1.2)
Total Protein: 6 g/dL — ABNORMAL LOW (ref 6.5–8.1)

## 2023-10-21 LAB — BLOOD GAS, VENOUS
Acid-Base Excess: 4.8 mmol/L — ABNORMAL HIGH (ref 0.0–2.0)
Bicarbonate: 30.4 mmol/L — ABNORMAL HIGH (ref 20.0–28.0)
O2 Saturation: 53.4 %
Patient temperature: 37
pCO2, Ven: 48 mmHg (ref 44–60)
pH, Ven: 7.41 (ref 7.25–7.43)
pO2, Ven: 36 mmHg (ref 32–45)

## 2023-10-21 LAB — CBC
HCT: 28 % — ABNORMAL LOW (ref 36.0–46.0)
Hemoglobin: 8.8 g/dL — ABNORMAL LOW (ref 12.0–15.0)
MCH: 32.2 pg (ref 26.0–34.0)
MCHC: 31.4 g/dL (ref 30.0–36.0)
MCV: 102.6 fL — ABNORMAL HIGH (ref 80.0–100.0)
Platelets: 284 10*3/uL (ref 150–400)
RBC: 2.73 MIL/uL — ABNORMAL LOW (ref 3.87–5.11)
RDW: 15.7 % — ABNORMAL HIGH (ref 11.5–15.5)
WBC: 7.1 10*3/uL (ref 4.0–10.5)
nRBC: 0.3 % — ABNORMAL HIGH (ref 0.0–0.2)

## 2023-10-21 LAB — PROCALCITONIN: Procalcitonin: <0.1 ng/mL

## 2023-10-21 LAB — RESP PANEL BY RT-PCR (RSV, FLU A&B, COVID)  RVPGX2
Influenza A by PCR: NEGATIVE
Influenza B by PCR: NEGATIVE
Resp Syncytial Virus by PCR: NEGATIVE
SARS Coronavirus 2 by RT PCR: NEGATIVE

## 2023-10-21 LAB — BRAIN NATRIURETIC PEPTIDE: B Natriuretic Peptide: 758.3 pg/mL — ABNORMAL HIGH (ref 0.0–100.0)

## 2023-10-21 MED ORDER — IPRATROPIUM-ALBUTEROL 0.5-2.5 (3) MG/3ML IN SOLN
3.0000 mL | Freq: Four times a day (QID) | RESPIRATORY_TRACT | Status: DC
Start: 2023-10-21 — End: 2023-10-24
  Administered 2023-10-21 – 2023-10-24 (×8): 3 mL via RESPIRATORY_TRACT
  Filled 2023-10-21 (×8): qty 3

## 2023-10-21 MED ORDER — METHYLPREDNISOLONE SODIUM SUCC 40 MG IJ SOLR
40.0000 mg | Freq: Two times a day (BID) | INTRAMUSCULAR | Status: AC
  Administered 2023-10-21 – 2023-10-22 (×2): 40 mg via INTRAVENOUS
  Filled 2023-10-21 (×2): qty 1

## 2023-10-21 MED ORDER — ATORVASTATIN CALCIUM 10 MG PO TABS
20.0000 mg | ORAL_TABLET | Freq: Every day | ORAL | Status: DC
  Administered 2023-10-22 – 2023-10-25 (×4): 20 mg via ORAL
  Filled 2023-10-21 (×4): qty 2

## 2023-10-21 MED ORDER — PREDNISONE 20 MG PO TABS
40.0000 mg | ORAL_TABLET | Freq: Every day | ORAL | Status: DC
  Administered 2023-10-23 – 2023-10-24 (×2): 40 mg via ORAL
  Filled 2023-10-21 (×2): qty 2

## 2023-10-21 MED ORDER — BISOPROLOL FUMARATE 5 MG PO TABS
2.5000 mg | ORAL_TABLET | Freq: Every day | ORAL | Status: DC
  Administered 2023-10-21 – 2023-10-25 (×4): 2.5 mg via ORAL
  Filled 2023-10-21 (×5): qty 0.5

## 2023-10-21 MED ORDER — IPRATROPIUM-ALBUTEROL 0.5-2.5 (3) MG/3ML IN SOLN
3.0000 mL | Freq: Once | RESPIRATORY_TRACT | Status: AC
  Administered 2023-10-21: 3 mL via RESPIRATORY_TRACT
  Filled 2023-10-21: qty 3

## 2023-10-21 MED ORDER — IOHEXOL 350 MG/ML SOLN
60.0000 mL | Freq: Once | INTRAVENOUS | Status: AC | PRN
  Administered 2023-10-21: 60 mL via INTRAVENOUS

## 2023-10-21 MED ORDER — LEVALBUTEROL HCL 0.63 MG/3ML IN NEBU: 0.6300 mg | INHALATION_SOLUTION | Freq: Four times a day (QID) | RESPIRATORY_TRACT | Status: DC | PRN

## 2023-10-21 MED ORDER — AMIODARONE HCL 200 MG PO TABS
200.0000 mg | ORAL_TABLET | Freq: Every day | ORAL | Status: DC
  Administered 2023-10-22 – 2023-10-25 (×3): 200 mg via ORAL
  Filled 2023-10-21 (×4): qty 1

## 2023-10-21 MED ORDER — SODIUM CHLORIDE 0.9 % IV SOLN
100.0000 mg | Freq: Two times a day (BID) | INTRAVENOUS | Status: DC
  Administered 2023-10-21 – 2023-10-23 (×4): 100 mg via INTRAVENOUS
  Filled 2023-10-21 (×4): qty 100

## 2023-10-21 MED ORDER — APIXABAN 2.5 MG PO TABS
2.5000 mg | ORAL_TABLET | Freq: Two times a day (BID) | ORAL | Status: DC
  Administered 2023-10-21 – 2023-10-25 (×8): 2.5 mg via ORAL
  Filled 2023-10-21 (×8): qty 1

## 2023-10-21 MED ORDER — METHYLPREDNISOLONE SODIUM SUCC 125 MG IJ SOLR
125.0000 mg | INTRAMUSCULAR | Status: AC
  Administered 2023-10-21: 125 mg via INTRAVENOUS
  Filled 2023-10-21: qty 2

## 2023-10-21 NOTE — H&P (Signed)
 History and Physical    Patient: Dana Pruitt ZOX:[id number] DOB: May 13, 1941 DOA: 10/21/2023 DOS: the patient was seen and examined on 10/21/2023 PCP: Imelda Man, MD  Patient coming from: Home  Chief Complaint:  Chief Complaint  Patient presents with   Shortness of Breath   HPI: MCKINLEE WADELL is a 83 y.o. female with medical history significant of COPD, systolic dysfunction CHF, anemia of chronic disease, history of breast cancer, coronary artery disease, history of aortic valve replacement, depression, GERD, paroxysmal atrial fibrillation, hyperlipidemia, essential hypertension who presents to the ER with worsening shortness of breath and cough.  Patient's oxygen  saturation was in the 60s on her regular 4 L of oxygen  per minute.  She apparently had recent pneumonia and flu about 2 weeks ago.  Patient also has cough that has not gone away despite treatment.  She is currently on 6 L of oxygen  per minute.  Patient seen and evaluated in the ER.  Chest x-ray did not show significant pulmonary edema.  She had expiratory wheezing and poor air movement.  Appears to have acute exacerbation of COPD.  Patient being admitted to the hospital for further evaluation and treatment.  Review of Systems: As mentioned in the history of present illness. All other systems reviewed and are negative. Past Medical History:  Diagnosis Date   Anemia    "several times over the years" (10/17/2012)   Anxiety    Breast cancer (HCC) 02/2010   right breast   CAD (coronary artery disease)    a. LHC 12/2018: mild non-obstructive CAD   Carcinoma of breast treated with adjuvant hormone therapy (HCC) 02/2010   Femara    Chronic systolic CHF    a. Echo in 2015: LVEF of 35-40%, b. Echo in 12/2021: LVEF of 60-65%   COPD (chronic obstructive pulmonary disease) (HCC)    Depression    GERD (gastroesophageal reflux disease)    diet controlled, no med   Hollenhorst plaque, left eye    apparently resolved at recent  ophthalmology visit   Hyperlipidemia    Hypertension    IDC, RightStage II, 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   Moderate aortic insufficiency    Non-ischemic cardiomyopathy (HCC)    Paroxysmal atrial fibrillation (HCC)    Pneumonia    "that's why I'm here" (10/17/2012)   S/P AVR (aortic valve replacement) 04/26/2019   Stroke (HCC)    12/2021   Past Surgical History:  Procedure Laterality Date   AORTIC VALVE REPLACEMENT N/A 01/31/2019   Procedure: AORTIC VALVE REPLACEMENT (AVR) USING 21 MM INSPIRIS RESILIS AORTIC VALVE. SN: [id number];  Surgeon: Matt Song, Donata Fryer, MD;  Location: [facility] OR;  Service: Open Heart Surgery;  Laterality: N/A;   APPENDECTOMY  1974   BREAST BIOPSY Right 02/27/10   Needle core Biopsy; Invasive Mammary; ER/PR Positive, Her-2 Neu negative, Ki-67 22%   BREAST LUMPECTOMY WITH SENTINEL LYMPH NODE BIOPSY Right 07/03/11   Invasive Ductal Carcinoma;0/3 nodes negative,; ER,PR Positive, Her-2 Neg; Ki-67 22%   CESAREAN SECTION  1970; 1974   COLONOSCOPY     DILATION AND CURETTAGE OF UTERUS  1970's   RIGHT HEART CATH AND CORONARY ANGIOGRAPHY N/A 08/31/2023   Procedure: RIGHT HEART CATH AND CORONARY ANGIOGRAPHY;  Surgeon: Arnoldo Lapping, MD;  Location: [facility] INVASIVE CV LAB;  Service: Cardiovascular;  Laterality: N/A;   RIGHT/LEFT HEART CATH AND CORONARY ANGIOGRAPHY N/A 01/02/2019   Procedure: RIGHT/LEFT HEART CATH AND CORONARY ANGIOGRAPHY;  Surgeon: Darlis Eisenmenger, MD;  Location: [facility] INVASIVE CV LAB;  Service: Cardiovascular;  Laterality: N/A;   TEE WITHOUT CARDIOVERSION N/A 01/31/2019   Procedure: TRANSESOPHAGEAL ECHOCARDIOGRAM (TEE);  Surgeon: Matt Song, Donata Fryer, MD;  Location: [facility] OR;  Service: Open Heart Surgery;  Laterality: N/A;   TUBAL LIGATION  1974   WISDOM TOOTH EXTRACTION     Social History:  reports that she quit smoking about 33 years ago. Her smoking use included cigarettes. She started smoking about 65 years ago. She has a 96.3 pack-year smoking history. She has never used smokeless  tobacco. She reports that she does not currently use alcohol. She reports that she does not use drugs.  No Known Allergies  Family History  Problem Relation Age of Onset   Heart disease Mother    Cancer Father    Heart disease Maternal Grandmother    Heart disease Maternal Grandfather    Cancer Paternal Grandmother    Lung disease Neg Hx     Prior to Admission medications   Medication Sig Start Date End Date Taking? Authorizing Provider  albuterol  (PROAIR  HFA) 108 (90 Base) MCG/ACT inhaler 2 puffs every 4 hours as needed only  if your can't catch your breath 09/14/22   Diamond Formica, MD  albuterol  (PROVENTIL ) (2.5 MG/3ML) 0.083% nebulizer solution Take 3 mLs (2.5 mg total) by nebulization every 6 (six) hours as needed for wheezing or shortness of breath. 08/16/23   Cobb, Mariah Shines, NP  amiodarone  (PACERONE ) 200 MG tablet Take 1 tablet (200 mg total) by mouth daily. 09/02/23   Parcells, Cyrilla Drivers, PA-C  apixaban  (ELIQUIS ) 2.5 MG TABS tablet Take 1 tablet (2.5 mg total) by mouth 2 (two) times daily. 10/11/23   Avanell Leigh, MD  atorvastatin  (LIPITOR) 20 MG tablet Take 20 mg by mouth daily.  02/12/14   [provider]  bisoprolol  (ZEBETA ) 5 MG tablet Take 0.5 tablets (2.5 mg total) by mouth daily. 09/03/23   Parcells, Cyrilla Drivers, PA-C  BREZTRI  AEROSPHERE 160-9-4.8 MCG/ACT AERO INHALE 2 PUFFS INTO THE LUNGS TWICE DAILY 02/02/23   Diamond Formica, MD  Cholecalciferol  (VITAMIN D3) 25 MCG (1000 UT) CAPS Take 1,000 Units by mouth daily. 02/10/23   [provider]  ezetimibe  (ZETIA ) 10 MG tablet Take 10 mg by mouth daily.    [provider]  FEROSUL 325 (65 Fe) MG tablet Take 325 mg by mouth every other day. 05/08/23   [provider]  fluticasone  (FLONASE ) 50 MCG/ACT nasal spray SHAKE LIQUID AND USE 2 SPRAYS IN EACH NOSTRIL DAILY 03/18/23   Wert, Michael B, MD  furosemide  (LASIX ) 20 MG tablet TAKE 1 TABLET(20 MG) BY MOUTH DAILY 10/13/23   Cobb, Mariah Shines, NP   guaiFENesin  (MUCINEX ) 600 MG 12 hr tablet Take 1 tablet (600 mg total) by mouth 2 (two) times daily. 02/07/19   Gold, Wayne E, PA-C  guaiFENesin -dextromethorphan  (ROBITUSSIN DM) 100-10 MG/5ML syrup Take 5 mLs by mouth every 4 (four) hours as needed for cough. 09/16/23   Lesa Rape, MD  ipratropium (ATROVENT ) 0.06 % nasal spray Place 2 sprays into both nostrils 4 (four) times daily. 08/16/23   Cobb, Mariah Shines, NP  losartan  (COZAAR ) 25 MG tablet Take 25 mg by mouth daily.    [provider]  OXYGEN  Inhale 4 L/min into the lungs continuous.    [provider]  potassium chloride  (KLOR-CON  M) 10 MEQ tablet TAKE 1 TABLET(10 MEQ) BY MOUTH DAILY 10/13/23   Cobb, Mariah Shines, NP  predniSONE  (DELTASONE ) 10 MG tablet Take by mouth: 4 tabs daily for two days;  then 3 tabs daily for two days; then 2 tabs daily for two days; then 1 tab daily for two days, then stop. 09/16/23   Lesa Rape, MD  sertraline  (ZOLOFT ) 100 MG tablet Take 200 mg by mouth daily.    [provider]  valACYclovir (VALTREX) 1000 MG tablet Take 500 mg by mouth as needed.    [provider]    Physical Exam: Vitals:   10/21/23 1430 10/21/23 1445 10/21/23 1600 10/21/23 1752  BP: (!) 135/91 (!) 144/91 108/64   Pulse: 84 88 88   Resp: (!) 22 (!) 26 (!) 22   Temp:    98.2 F (36.8 C)  TempSrc:    Oral  SpO2: (!) 86% 96% 92%   Weight:      Height:       Constitutional: Acute on chronically ill looking, NAD, calm, comfortable Eyes: PERRL, lids and conjunctivae normal ENMT: Mucous membranes are moist. Posterior pharynx clear of any exudate or lesions.Normal dentition.  Neck: normal, supple, no masses, no thyromegaly Respiratory: Decreased air entry bilaterally with marked expiratory wheezing, no crackles. Normal respiratory effort. No accessory muscle use.  Cardiovascular: Regular rate and rhythm, no murmurs / rubs / gallops. No extremity edema. 2+ pedal pulses. No carotid bruits.  Abdomen: no  tenderness, no masses palpated. No hepatosplenomegaly. Bowel sounds positive.  Musculoskeletal: Good range of motion, no joint swelling or tenderness, Skin: no rashes, lesions, ulcers. No induration Neurologic: CN 2-12 grossly intact. Sensation intact, DTR normal. Strength 5/5 in all 4.  Psychiatric: Normal judgment and insight. Alert and oriented x 3. Normal mood  Data Reviewed:  Temperature 98.6, blood pressure 140/91, respiratory 26 oxygen  sat 86% on 6 L hemoglobin 8.8 creatinine 1.27 BNP 758.3, glucose 141 chest x-ray shows streaky retrocardiac opacities which could reflect atelectasis or infiltrate, CT angiogram of the chest shows no evidence of PE but bilateral bronchiectatic changes with mild mucous plugging of the left lung base.  Aortic atherosclerosis.  Assessment and Plan:  #1 acute on chronic hypoxic respiratory failure: Appears to be secondary to COPD exacerbation.  Patient also had recent pneumonia.  Bronchiectatic changes noted.  Continue to monitor.  #2 acute exacerbation of COPD: Continue steroids, nebulizer, antibiotics  #3 paroxysmal atrial fibrillation: Rate is controlled.  Continue anticoagulation.  Continue rate control medications.  #4 hyperlipidemia: Continue with statin  #5 essential hypertension: Continue home regimen  #6 hyperlipidemia: Continue with statin  #7 chronic systolic CHF: At this point is compensated.  Continue with diuresis  #8 pulmonary hypertension: Continue with home regimen.  Oxygen .  #9 depression with anxiety: Continue home regimen    Advance Care Planning:   Code Status: Full Code   Consults: None  Family Communication: No family at bedside  Severity of Illness: The appropriate patient status for this patient is INPATIENT. Inpatient status is judged to be reasonable and necessary in order to provide the required intensity of service to ensure the patient's safety. The patient's presenting symptoms, physical exam findings, and initial  radiographic and laboratory data in the context of their chronic comorbidities is felt to place them at high risk for further clinical deterioration. Furthermore, it is not anticipated that the patient will be medically stable for discharge from the hospital within 2 midnights of admission.   * I certify that at the point of admission it is my clinical judgment that the patient will require inpatient hospital care spanning beyond 2 midnights from the point of admission due to high intensity of  service, high risk for further deterioration and high frequency of surveillance required.*  AuthorCarolin Chyle, MD 10/21/2023 6:53 PM  For on call review [email or web address].

## 2023-10-21 NOTE — ED Triage Notes (Signed)
 Patient bib EMS from PCP for oxygen  saturation 65%.  PT has had SOB x 2 weeks, had pneumonia and flu 2 weeks ago.   Initial oxygen  on her normal 4L was 65% and could only speak one word at a time.   On arrival to ED pt A&Ox4 and sating 97% on 4L.

## 2023-10-21 NOTE — ED Provider Notes (Signed)
 [place] EMERGENCY DEPARTMENT AT [facility] Provider Note   CSN: [id number] Arrival date & time: 10/21/23  1316     History  Chief Complaint  Patient presents with   Shortness of Breath    Dana Pruitt is a 83 y.o. female.  83 year old female with history of CHF, COPD on 4 L oxygen , hypertension, atrial fibrillation on Eliquis , and aortic valve replacement who presents to the emergency department shortness of breath.  Patient reports that she was diagnosed with the flu and pneumonia 2 weeks ago.  Was treated with antibiotics.  Since then has felt persistently short of breath but reports that her cough has resolved.  No fevers or chills.  Went to her primary doctor today for routine checkup and they noted that she was satting 65% on her 4 L and appeared to be very winded.  Says she may have missed a few Eliquis  doses.       Home Medications Prior to Admission medications   Medication Sig Start Date End Date Taking? Authorizing Provider  albuterol  (PROAIR  HFA) 108 (90 Base) MCG/ACT inhaler 2 puffs every 4 hours as needed only  if your can't catch your breath 09/14/22   Diamond Formica, MD  albuterol  (PROVENTIL ) (2.5 MG/3ML) 0.083% nebulizer solution Take 3 mLs (2.5 mg total) by nebulization every 6 (six) hours as needed for wheezing or shortness of breath. 08/16/23   Cobb, Mariah Shines, NP  amiodarone  (PACERONE ) 200 MG tablet Take 1 tablet (200 mg total) by mouth daily. 09/02/23   Parcells, Cyrilla Drivers, PA-C  apixaban  (ELIQUIS ) 2.5 MG TABS tablet Take 1 tablet (2.5 mg total) by mouth 2 (two) times daily. 10/11/23   Avanell Leigh, MD  atorvastatin  (LIPITOR) 20 MG tablet Take 20 mg by mouth daily.  02/12/14   [provider]  bisoprolol  (ZEBETA ) 5 MG tablet Take 0.5 tablets (2.5 mg total) by mouth daily. 09/03/23   Parcells, Cyrilla Drivers, PA-C  BREZTRI  AEROSPHERE 160-9-4.8 MCG/ACT AERO INHALE 2 PUFFS INTO THE LUNGS TWICE DAILY 02/02/23   Wert, Michael B, MD  Cholecalciferol   (VITAMIN D3) 25 MCG (1000 UT) CAPS Take 1,000 Units by mouth daily. 02/10/23   [provider]  ezetimibe  (ZETIA ) 10 MG tablet Take 10 mg by mouth daily.    [provider]  FEROSUL 325 (65 Fe) MG tablet Take 325 mg by mouth every other day. 05/08/23   [provider]  fluticasone  (FLONASE ) 50 MCG/ACT nasal spray SHAKE LIQUID AND USE 2 SPRAYS IN EACH NOSTRIL DAILY 03/18/23   Wert, Michael B, MD  furosemide  (LASIX ) 20 MG tablet TAKE 1 TABLET(20 MG) BY MOUTH DAILY 10/13/23   Cobb, Mariah Shines, NP  guaiFENesin  (MUCINEX ) 600 MG 12 hr tablet Take 1 tablet (600 mg total) by mouth 2 (two) times daily. 02/07/19   Gold, Wayne E, PA-C  guaiFENesin -dextromethorphan  (ROBITUSSIN DM) 100-10 MG/5ML syrup Take 5 mLs by mouth every 4 (four) hours as needed for cough. 09/16/23   Lesa Rape, MD  ipratropium (ATROVENT ) 0.06 % nasal spray Place 2 sprays into both nostrils 4 (four) times daily. 08/16/23   Cobb, Mariah Shines, NP  losartan  (COZAAR ) 25 MG tablet Take 25 mg by mouth daily.    [provider]  OXYGEN  Inhale 4 L/min into the lungs continuous.    [provider]  potassium chloride  (KLOR-CON  M) 10 MEQ tablet TAKE 1 TABLET(10 MEQ) BY MOUTH DAILY 10/13/23   Cobb, Mariah Shines, NP  predniSONE  (DELTASONE ) 10 MG tablet  Take by mouth: 4 tabs daily for two days; then 3 tabs daily for two days; then 2 tabs daily for two days; then 1 tab daily for two days, then stop. 09/16/23   Lesa Rape, MD  sertraline  (ZOLOFT ) 100 MG tablet Take 200 mg by mouth daily.    [provider]  valACYclovir (VALTREX) 1000 MG tablet Take 500 mg by mouth as needed.    [provider]      Allergies    Patient has no known allergies.    Review of Systems   Review of Systems  Physical Exam Updated Vital Signs BP (!) 139/91 (BP Location: Left Arm)   Pulse 79   Temp 98.3 F (36.8 C) (Oral)   Resp (!) 22   Ht 5\' 4"  (1.626 m)   Wt 53.1 kg   LMP  (LMP Unknown)   SpO2 97%   BMI  20.08 kg/m  Physical Exam Constitutional:      General: She is not in acute distress.    Appearance: Normal appearance. She is not ill-appearing.     Comments: Speaking in short phrases but does not appear to be in acute distress  HENT:     Head: Normocephalic and atraumatic.     Right Ear: External ear normal.     Left Ear: External ear normal.     Mouth/Throat:     Mouth: Mucous membranes are moist.     Pharynx: Oropharynx is clear.  Eyes:     Extraocular Movements: Extraocular movements intact.     Conjunctiva/sclera: Conjunctivae normal.     Pupils: Pupils are equal, round, and reactive to light.  Neck:     Comments: No C-spine midline tenderness to palpation Cardiovascular:     Rate and Rhythm: Normal rate and regular rhythm.     Pulses: Normal pulses.     Heart sounds: Normal heart sounds.  Pulmonary:     Effort: Pulmonary effort is normal. No respiratory distress.     Breath sounds: Normal breath sounds.     Comments: On 4 L nasal Musculoskeletal:        General: No deformity. Normal range of motion.     Cervical back: No rigidity or tenderness.     Comments: No tenderness to palpation of midline thoracic or lumbar spine.  No step-offs palpated.  No tenderness to palpation of chest wall.  No bruising noted.  No tenderness to palpation of bilateral clavicles.  No tenderness to palpation, bruising, or deformities noted of bilateral shoulders, elbows, wrists, hips, knees, or ankles.  Neurological:     Mental Status: She is alert and oriented to person, place, and time. Mental status is at baseline.     Sensory: No sensory deficit.     ED Results / Procedures / Treatments   Labs (all labs ordered are listed, but only abnormal results are displayed) Labs Reviewed  COMPREHENSIVE METABOLIC PANEL WITH GFR - Abnormal; Notable for the following components:      Result Value   Glucose, Bld 141 (*)    Creatinine, Ser 1.27 (*)    Total Protein 6.0 (*)    Albumin  3.2 (*)     GFR, Estimated 42 (*)    All other components within normal limits  CBC - Abnormal; Notable for the following components:   RBC 2.73 (*)    Hemoglobin 8.8 (*)    HCT 28.0 (*)    MCV 102.6 (*)    RDW 15.7 (*)  nRBC 0.3 (*)    All other components within normal limits  BLOOD GAS, VENOUS - Abnormal; Notable for the following components:   Bicarbonate 30.4 (*)    Acid-Base Excess 4.8 (*)    All other components within normal limits  BRAIN NATRIURETIC PEPTIDE - Abnormal; Notable for the following components:   B Natriuretic Peptide 758.3 (*)    All other components within normal limits  RESP PANEL BY RT-PCR (RSV, FLU A&B, COVID)  RVPGX2  PROCALCITONIN  HIV ANTIBODY (ROUTINE TESTING W REFLEX)  COMPREHENSIVE METABOLIC PANEL WITH GFR  CBC    EKG EKG Interpretation Date/Time:  Thursday Oct 21 2023 13:22:57 EDT Ventricular Rate:  87 PR Interval:  157 QRS Duration:  148 QT Interval:  419 QTC Calculation: 505 R Axis:   264  Text Interpretation: Sinus rhythm IVCD, consider atypical RBBB Probable lateral infarct, age indeterminate Anteroseptal infarct, age indeterminate Confirmed by Shyrl Doyne [phone]) on 10/21/2023 3:10:45 PM  Radiology CT Angio Chest Pulmonary Embolism (PE) W or WO Contrast Result Date: 10/21/2023 CLINICAL DATA:  Pulmonary embolism (PE) suspected, high prob EXAM: CT ANGIOGRAPHY CHEST WITH CONTRAST TECHNIQUE: Multidetector CT imaging of the chest was performed using the standard protocol during bolus administration of intravenous contrast. Multiplanar CT image reconstructions and MIPs were obtained to evaluate the vascular anatomy. RADIATION DOSE REDUCTION: This exam was performed according to the departmental dose-optimization program which includes automated exposure control, adjustment of the mA and/or kV according to patient size and/or use of iterative reconstruction technique. CONTRAST:  60mL OMNIPAQUE  IOHEXOL  350 MG/ML SOLN COMPARISON:  CTA chest dated  09/13/2023. FINDINGS: Cardiovascular: Satisfactory opacification of the pulmonary arteries to the segmental level. No evidence of pulmonary embolism. Stable mild cardiomegaly. Multivessel coronary artery calcifications. Prior aortic valve replacement. Unchanged aneurysmal dilatation of the ascending thoracic aorta, measuring 4.5 cm. Atherosclerotic calcification of the thoracic aorta and arch branch vessels. Unchanged dilation of the pulmonary trunk measuring 4.1 cm in diameter. Mediastinum/Nodes: No enlarged mediastinal, hilar, or axillary lymph nodes. Thyroid  gland, trachea, and esophagus demonstrate no significant findings. Lungs/Pleura: Paraseptal and centrilobular emphysema. Biapical pleuroparenchymal scarring. Bilateral bronchiectatic changes with probable mild mucous plugging at the left lung base. The previously noted consolidation in the lingula has nearly resolved. Similar fibrotic changes along the anterior aspect of the right upper lobe. Stable size of a few scattered sub 4 mm pulmonary nodules. No new suspicious or enlarging pulmonary nodule. No pleural effusion or pneumothorax. Upper Abdomen: No acute abnormality. Musculoskeletal: Prior median sternotomy. No acute osseous abnormality. No suspicious osseous lesion. Review of the MIP images confirms the above findings. IMPRESSION: 1. No evidence of pulmonary embolism. 2. Bilateral bronchiectatic changes with mild mucous plugging at the left lung base. The previously noted consolidation in the lingula has nearly resolved. 3. Aortic atherosclerosis with aneurysmal dilation of the ascending thoracic aorta measuring 4.5 cm. Recommend semi-annual imaging followup by CTA or MRA and referral to cardiothoracic surgery if not already obtained. This recommendation follows 2010 ACCF/AHA/AATS/ACR/ASA/SCA/SCAI/SIR/STS/SVM Guidelines for the Diagnosis and Management of Patients With Thoracic Aortic Disease. Circulation. 2010; 121: W098-J19. Aortic aneurysm NOS  (ICD10-I71.9) 4. Similar dilation of the pulmonary trunk, which can be seen in the setting of pulmonary arterial hypertension. 5. Aortic Atherosclerosis (ICD10-I70.0) and Emphysema (ICD10-J43.9). Electronically Signed   By: Mannie Seek M.D.   On: 10/21/2023 18:01   DG Chest 2 View Result Date: 10/21/2023 CLINICAL DATA:  Shortness of breath. EXAM: CHEST - 2 VIEW COMPARISON:  09/13/2023. FINDINGS: Stable cardiomegaly. Prior median sternotomy  and valve prosthesis. Emphysema. Streaky retrocardiac opacities, could reflect atelectasis or infiltrate. The right lung appears clear. No pleural effusion or pneumothorax. No acute osseous abnormality. IMPRESSION: 1. Streaky retrocardiac opacities could reflect atelectasis or infiltrate. 2. Emphysema. Electronically Signed   By: Mannie Seek M.D.   On: 10/21/2023 15:02    Procedures Procedures    Medications Ordered in ED Medications  amiodarone  (PACERONE ) tablet 200 mg (has no administration in time range)  atorvastatin  (LIPITOR) tablet 20 mg (has no administration in time range)  bisoprolol  (ZEBETA ) tablet 2.5 mg (has no administration in time range)  apixaban  (ELIQUIS ) tablet 2.5 mg (has no administration in time range)  methylPREDNISolone  sodium succinate (SOLU-MEDROL ) 40 mg/mL injection 40 mg (has no administration in time range)    Followed by  predniSONE  (DELTASONE ) tablet 40 mg (has no administration in time range)  ipratropium-albuterol  (DUONEB) 0.5-2.5 (3) MG/3ML nebulizer solution 3 mL (has no administration in time range)  levalbuterol  (XOPENEX ) nebulizer solution 0.63 mg (has no administration in time range)  doxycycline  (VIBRAMYCIN ) 100 mg in sodium chloride  0.9 % 250 mL IVPB (has no administration in time range)  ipratropium-albuterol  (DUONEB) 0.5-2.5 (3) MG/3ML nebulizer solution 3 mL (3 mLs Nebulization Given 10/21/23 1328)  ipratropium-albuterol  (DUONEB) 0.5-2.5 (3) MG/3ML nebulizer solution 3 mL (3 mLs Nebulization Given 10/21/23  1510)  methylPREDNISolone  sodium succinate (SOLU-MEDROL ) 125 mg/2 mL injection 125 mg (125 mg Intravenous Given 10/21/23 1510)  iohexol  (OMNIPAQUE ) 350 MG/ML injection 60 mL (60 mLs Intravenous Contrast Given 10/21/23 1737)    ED Course/ Medical Decision Making/ A&P Clinical Course as of 10/21/23 2024  Thu Oct 21, 2023  1605 Signed out to Dr Leida Puna [RP]    Clinical Course User Index [RP] Ninetta Basket, MD                                 Medical Decision Making Amount and/or Complexity of Data Reviewed Labs: ordered. Radiology: ordered.  Risk Prescription drug management. Decision regarding hospitalization.   Dana Pruitt is a 83 y.o. female with comorbidities that complicate the patient evaluation including CHF, COPD on 4 L oxygen , hypertension, atrial fibrillation on Eliquis , and aortic valve replacement who presents to the emergency department shortness of breath.   Initial Ddx:  Respiratory distress, COPD exacerbation, CHF exacerbation, pneumonia, URI, PE  MDM/Course:  Patient presents to the emergency department with shortness of breath.  Appears that this has been persistent since he was diagnosed with pneumonia.  Her infectious symptoms including fevers, cough, and chills appear to be improving.  Was initially on a nonrebreather and was able to quickly be weaned to nasal cannula.  Received some breathing treatments and Solu-Medrol  though did not have significant wheezing.  With her missed doses of Eliquis  we will obtain a CTA.  Chest x-ray appeared to show atelectasis versus infiltrate in the left base and the CT will help further characterize this.  White blood cell count is normal.  She is afebrile this time.  Did send procalcitonin to help differentiate whether or not this could be persistent pneumonia.  Upon re-evaluation patient did have an increased oxygen  requirement of 5 L.  Signed out to the oncoming physician awaiting results of imaging.  Anticipate admission for  the patient.  This patient presents to the ED for concern of complaints listed in HPI, this involves an extensive number of treatment options, and is a complaint that carries with it a high  risk of complications and morbidity. Disposition including potential need for admission considered.   Dispo: Pending remainder of workup  Additional history obtained from EMS Records reviewed Outpatient Clinic Notes The following labs were independently interpreted: Chemistry and show CKD I independently reviewed the following imaging with scope of interpretation limited to determining acute life threatening conditions related to emergency care: Chest x-ray and agree with the radiologist interpretation with the following exceptions: none I personally reviewed and interpreted cardiac monitoring: normal sinus rhythm  I personally reviewed and interpreted the pt's EKG: see above for interpretation  I have reviewed the patients home medications and made adjustments as needed Social Determinants of health:  Geriatric  Portions of this note were generated with Scientist, clinical (histocompatibility and immunogenetics). Dictation errors may occur despite best attempts at proofreading.     Final Clinical Impression(s) / ED Diagnoses Final diagnoses:  COPD exacerbation (HCC)  Hypoxia    Rx / DC Orders ED Discharge Orders     None         Ninetta Basket, MD 10/21/23 2024

## 2023-10-21 NOTE — Progress Notes (Signed)
 Admission notes:  1930: New pt came from ED. Noted pt on 6L O2/[place], dyspneic and tachypneic, 90-92% on 6L , pt is A&O x 4. RT paged for neb tx. Dr. Reesa Cannon notified and updated. Per MD to keep O2 >90%.  Sinus rhythm on the cardiac monitor.   2 RN skin assessment done with Corrine White, RN  Educated on falls and safety precautions, call bell, plan of care and pt verbalizes understanding.   Bed alarm on. Call bell in reach. Will continue to monitor.

## 2023-10-21 NOTE — ED Provider Notes (Signed)
 Hx of COPD, CHF and PNA 1 month ago.  Hypoxic at PCP today in the 60's.  Normally wears 4L at home.  Has had nebs/solumedrol here with subjective improvement.  No new infectious sx.  CXR with questionable findings.  Patient is still requiring 5 L of oxygen  to maintain oxygen  saturation.  Her blood gas, CMP, viral panel and CBC all without acute findings but she does have persistent anemia.  She was pending a CTA which has now returned and shows no evidence of a PE, bilateral bronchiectatic changes with mild mucous plugging at the left lung base but consolidation has resolved.  She also does have a 4.5 cm aneurysmal dilation that needs to be followed annually.  Patient has no external findings of fluid overload but does have a known EF of 30 to 35%.  She does not take diuretics.  BMP is pending.  However feel this is most likely a respiratory cause today.  Will admit for worsening hypoxia on her home O2 and COPD exacerbation.  Feel that patient requires admission and consult to the hospitalist.   Almond Army, MD 10/21/23 [phone]

## 2023-10-21 NOTE — Progress Notes (Signed)
 Pt desat to 87-90% on 6L/[place] even with minimal exertion. Dr. Del Favia made aware. Dr. Carlton Chick was at bedside and aware. IV lasix  x 1 dose given as ordered.

## 2023-10-22 DIAGNOSIS — E43 Unspecified severe protein-calorie malnutrition: Secondary | ICD-10-CM | POA: Insufficient documentation

## 2023-10-22 DIAGNOSIS — J441 Chronic obstructive pulmonary disease with (acute) exacerbation: Principal | ICD-10-CM

## 2023-10-22 LAB — CBC
HCT: 25.3 % — ABNORMAL LOW (ref 36.0–46.0)
Hemoglobin: 8.3 g/dL — ABNORMAL LOW (ref 12.0–15.0)
MCH: 32.5 pg (ref 26.0–34.0)
MCHC: 32.8 g/dL (ref 30.0–36.0)
MCV: 99.2 fL (ref 80.0–100.0)
Platelets: 282 10*3/uL (ref 150–400)
RBC: 2.55 MIL/uL — ABNORMAL LOW (ref 3.87–5.11)
RDW: 15.6 % — ABNORMAL HIGH (ref 11.5–15.5)
WBC: 5.3 10*3/uL (ref 4.0–10.5)
nRBC: 0.6 % — ABNORMAL HIGH (ref 0.0–0.2)

## 2023-10-22 LAB — RESPIRATORY PANEL BY PCR

## 2023-10-22 LAB — COMPREHENSIVE METABOLIC PANEL WITH GFR
ALT: 13 U/L (ref 0–44)
AST: 17 U/L (ref 15–41)
Albumin: 3.1 g/dL — ABNORMAL LOW (ref 3.5–5.0)
Alkaline Phosphatase: 42 U/L (ref 38–126)
Anion gap: 11 (ref 5–15)
BUN: 18 mg/dL (ref 8–23)
CO2: 27 mmol/L (ref 22–32)
Calcium: 9.3 mg/dL (ref 8.9–10.3)
Chloride: 100 mmol/L (ref 98–111)
Creatinine, Ser: 1.25 mg/dL — ABNORMAL HIGH (ref 0.44–1.00)
GFR, Estimated: 43 mL/min — ABNORMAL LOW (ref >60)
Glucose, Bld: 145 mg/dL — ABNORMAL HIGH (ref 70–99)
Potassium: 3.8 mmol/L (ref 3.5–5.1)
Sodium: 138 mmol/L (ref 135–145)
Total Bilirubin: 0.7 mg/dL (ref 0.0–1.2)
Total Protein: 5.9 g/dL — ABNORMAL LOW (ref 6.5–8.1)

## 2023-10-22 LAB — HIV ANTIBODY (ROUTINE TESTING W REFLEX): HIV Screen 4th Generation wRfx: NONREACTIVE

## 2023-10-22 MED ORDER — DM-GUAIFENESIN ER 30-600 MG PO TB12
1.0000 | ORAL_TABLET | Freq: Two times a day (BID) | ORAL | Status: DC
  Administered 2023-10-22 – 2023-10-25 (×7): 1 via ORAL
  Filled 2023-10-22 (×7): qty 1

## 2023-10-22 MED ORDER — PSEUDOEPHEDRINE HCL ER 120 MG PO TB12
120.0000 mg | ORAL_TABLET | Freq: Two times a day (BID) | ORAL | Status: AC
Start: 2023-10-22 — End: 2023-10-23
  Administered 2023-10-22 – 2023-10-23 (×3): 120 mg via ORAL
  Filled 2023-10-22 (×3): qty 1

## 2023-10-22 MED ORDER — PROCHLORPERAZINE EDISYLATE 10 MG/2ML IJ SOLN: 5.0000 mg | Freq: Four times a day (QID) | INTRAMUSCULAR | Status: DC | PRN

## 2023-10-22 MED ORDER — POLYETHYLENE GLYCOL 3350 17 G PO PACK: 17.0000 g | PACK | Freq: Every day | ORAL | Status: DC | PRN

## 2023-10-22 MED ORDER — FUROSEMIDE 10 MG/ML IJ SOLN
40.0000 mg | Freq: Once | INTRAMUSCULAR | Status: AC
  Administered 2023-10-22: 40 mg via INTRAVENOUS
  Filled 2023-10-22: qty 4

## 2023-10-22 MED ORDER — MELATONIN 5 MG PO TABS
5.0000 mg | ORAL_TABLET | Freq: Every evening | ORAL | Status: DC | PRN
  Administered 2023-10-22 – 2023-10-24 (×4): 5 mg via ORAL
  Filled 2023-10-22 (×4): qty 1

## 2023-10-22 MED ORDER — THIAMINE MONONITRATE 100 MG PO TABS
100.0000 mg | ORAL_TABLET | Freq: Every day | ORAL | Status: DC
  Administered 2023-10-22 – 2023-10-25 (×4): 100 mg via ORAL
  Filled 2023-10-22 (×4): qty 1

## 2023-10-22 MED ORDER — ACETAMINOPHEN 325 MG PO TABS: 650.0000 mg | ORAL_TABLET | Freq: Four times a day (QID) | ORAL | Status: DC | PRN

## 2023-10-22 MED ORDER — ADULT MULTIVITAMIN W/MINERALS CH
1.0000 | ORAL_TABLET | Freq: Every day | ORAL | Status: DC
  Administered 2023-10-22 – 2023-10-25 (×4): 1 via ORAL
  Filled 2023-10-22 (×4): qty 1

## 2023-10-22 NOTE — Progress Notes (Signed)
 Initial Nutrition Assessment  DOCUMENTATION CODES:   Severe malnutrition in context of chronic illness  INTERVENTION:   Encourage PO intake  Liberalize die to promote PO intake  Magic cup TID with meals, each supplement provides 290 kcal and 9 grams of protein Snacks BID Multivitamin with minerals daily  100 mg Thiamine  daily  NUTRITION DIAGNOSIS:   Severe Malnutrition related to chronic illness as evidenced by severe muscle depletion, severe fat depletion, percent weight loss (lost 11 lbs, 8.5% in 3 months.).  GOAL:   Patient will meet greater than or equal to 90% of their needs   MONITOR:   PO intake, Supplement acceptance, Labs  REASON FOR ASSESSMENT:   Consult Assessment of nutrition requirement/status  ASSESSMENT:  83 y.o Female who presented with worsening SOB and cough. PMH of COPD, CHF, anemia, breast cancer, CAD, aortic valve replacement, depression, GERD, a-fib, HLD, HTN.  Patient resting in chair on visit. She reports she had a pretty good appetite but does not eat many meals throughout the day, instead she snacks on foods like peanut butter, crackers, fruit, cheese etc. In total she reports she eats 1-2 meals per day, mostly from take out as she does not cook very often. She has tried Ensures at home but prefers not to drink them.   Patient reports she had the flu and pneumonia 2 weeks ago that created a cough that has not gone away despite treatment. She is usually on 4L of oxygen  per minute but is currently on 6L. She reports during this time her intake did not change and she was still eating the same way she usually did.   Patient denies recent weight loss and reports her UBW to be around 120 lbs. However, after chart review of weight history she has lost 11 lbs, 8.5% in 3 months.  On exam patient presents with severe muscle and fat wasting suspicious of cardiac cachexia.    Patient currently on heart healthy diet and had 100% of fruit cup, cheerios and  yogurt. Patient not amendable to many ONS but is willing to try magic cups and snacks.  Admit weight: 53.1 kg  Current weight: 53.1 kg    Average Meal Intake: 5/2: 100% intake x 1 recorded meals  Nutritionally Relevant Medications: Scheduled Meds:  amiodarone   200 mg Oral Daily   apixaban   2.5 mg Oral BID   atorvastatin   20 mg Oral Daily   bisoprolol   2.5 mg Oral Daily   dextromethorphan -guaiFENesin   1 tablet Oral BID   ipratropium-albuterol   3 mL Nebulization Q6H   [START ON 10/23/2023] predniSONE   40 mg Oral Q breakfast   pseudoephedrine   120 mg Oral BID   Continuous Infusions:  doxycycline  (VIBRAMYCIN ) IV 100 mg (10/22/23 0911)    Labs Reviewed: Creatinine 1.25  Total protein 5.9  GFR 43 No recent CBG  HgbA1c 5.9   NUTRITION - FOCUSED PHYSICAL EXAM:  Flowsheet Row Most Recent Value  Orbital Region Severe depletion  Upper Arm Region Severe depletion  Thoracic and Lumbar Region Severe depletion  Buccal Region Severe depletion  Temple Region Severe depletion  Clavicle Bone Region Severe depletion  Clavicle and Acromion Bone Region Moderate depletion  Scapular Bone Region Moderate depletion  Dorsal Hand Moderate depletion  Patellar Region Severe depletion  Anterior Thigh Region Severe depletion  Posterior Calf Region Severe depletion  Edema (RD Assessment) None  Hair Reviewed  Eyes Reviewed  Mouth Reviewed  Skin Reviewed  Nails Reviewed       Diet Order:  Diet Order             Diet Heart Room service appropriate? Yes; Fluid consistency: Thin  Diet effective now                   EDUCATION NEEDS:   Education needs have been addressed  Skin:  Skin Assessment: Reviewed RN Assessment  Last BM:  PTA  Height:   Ht Readings from Last 1 Encounters:  10/21/23 5\' 4"  (1.626 m)    Weight:   Wt Readings from Last 1 Encounters:  10/21/23 53.1 kg    Ideal Body Weight:  54.5 kg  BMI:  Body mass index is 20.08 kg/m.  Estimated Nutritional  Needs:   Kcal:  1500-1700 kcal  Protein:  70-90 gm  Fluid:  >1.5L/day   Frederik Jansky, RD Registered Dietitian  See Amion for more information

## 2023-10-22 NOTE — Evaluation (Signed)
 Occupational Therapy Evaluation Patient Details Name: Dana Pruitt MRN: [id number] DOB: [birth year] Today's Date: 10/22/2023   History of Present Illness   83 y.o. female admitted 10/21/23 with worsening SOB, cough, hypoxia. Workup for COPD exacerbation. Of note, recent admits 08/30/23 and 09/13/23 with flu, PNA. Other PMH includes COPD, CHF, CAD, HTN, HLD, PAF, s/p AVR, CA, breast CA.     Clinical Impressions Pt presents with decline in function and safety with ADLs and ADL mobility with impaired endurance and generalized weakness. PTA pt lives with her husband with she helps care for and that she was Ind with ADLs, IADLs; occasional help for cleaning, orders groceries online, typically Ind without AD, reports intermittent use of RW and working with [facility] PT recently, denies h/o falls, reports recently started using rollator. Pt currently requires Sup with ADLs and ADL mobility with no AD. Pt's functional activity limited by DOE on 6L O2. O2 SATs at rest on 6L O2 93%, dropping to 85% with in room activity. Pt instructed on deep, pursed lip breathing to recover OT SATs to 88%. Pt would benefit from acute OT services to address impairments to maximize level of function and safety    If plan is discharge home, recommend the following:   Assist for transportation;Assistance with cooking/housework     Functional Status Assessment   Patient has had a recent decline in their functional status and demonstrates the ability to make significant improvements in function in a reasonable and predictable amount of time.     Equipment Recommendations   None recommended by OT     Recommendations for Other Services         Precautions/Restrictions   Precautions Precautions: Fall;Other (comment) Recall of Precautions/Restrictions: Intact Precaution/Restrictions Comments: watch SpO2 (wears 4L O2 baseline) Restrictions Weight Bearing Restrictions Per Provider Order: No     Mobility Bed  Mobility               General bed mobility comments: in chair    Transfers Overall transfer level: Needs assistance Equipment used: None Transfers: Sit to/from Stand Sit to Stand: Supervision                  Balance Overall balance assessment: Needs assistance Sitting-balance support: No upper extremity supported Sitting balance-Leahy Scale: Good     Standing balance support: No upper extremity supported, During functional activity Standing balance-Leahy Scale: Fair                             ADL either performed or assessed with clinical judgement   ADL Overall ADL's : Needs assistance/impaired Eating/Feeding: Independent   Grooming: Wash/dry hands;Wash/dry face;Standing;Supervision/safety   Upper Body Bathing: Set up   Lower Body Bathing: Supervison/ safety   Upper Body Dressing : Set up   Lower Body Dressing: Supervision/safety   Toilet Transfer: Supervision/safety;Ambulation;Regular Toilet;Grab bars   Toileting- Clothing Manipulation and Hygiene: Supervision/safety;Sit to/from stand   Tub/ Shower Transfer: Supervision/safety;Ambulation;Grab bars   Functional mobility during ADLs: Supervision/safety       Vision Baseline Vision/History: 0 No visual deficits Ability to See in Adequate Light: 0 Adequate Patient Visual Report: No change from baseline       Perception         Praxis         Pertinent Vitals/Pain Pain Assessment Pain Assessment: No/denies pain     Extremity/Trunk Assessment Upper Extremity Assessment Upper Extremity Assessment: Overall WFL for tasks assessed  Lower Extremity Assessment Lower Extremity Assessment: Defer to PT evaluation       Communication Communication Communication: No apparent difficulties   Cognition Arousal: Alert Behavior During Therapy: WFL for tasks assessed/performed Cognition: No apparent impairments                               Following commands:  Intact       Cueing  General Comments      urinary urgency which pt attributes to Lasix . SpO2 84% on 6L O2 HFNC, return to >/88% with seated rest break. educ re: role of acute PT, POC, activity recommendations, energy conservation strategies; increased time discussing safe d/c plan (SNF vs continued HHPT); pt in agreement with return home, plans to look into having increased assist for husband so she can focus on her own recovery   Exercises     Shoulder Instructions      Home Living Family/patient expects to be discharged to:: Private residence Living Arrangements: Spouse/significant other Available Help at Discharge: Family;Available PRN/intermittently Type of Home: House Home Access: Stairs to enter [employer] of Steps: 2 Entrance Stairs-Rails: Can reach both Home Layout: Two level;Able to live on main level with bedroom/bathroom;Bed/bath upstairs Alternate Level Stairs-Number of Steps: stair lift recently installed (has 15 steps to bedroom) Alternate Level Stairs-Rails: Can reach both Bathroom Shower/Tub: Producer, television/film/video: Standard     Home Equipment: Grab bars - tub/shower;Grab bars - toilet;Shower seat - built [employer] fundraiser (2 wheels);Rollator (4 wheels);Other (comment) (pulse ox)   Additional Comments: wears 4L O2 [place] baseline. lives with husband who she supervises for majority of mobility, she cooks for him      Prior Functioning/Environment Prior Level of Function : Independent/Modified Independent;Driving             Mobility Comments: typically Ind without AD,  reports intermittent use of RW and working with HHPT since recent admission. denies h/o falls. states HHPT had started having her use rollator ADLs Comments: Ind with ADLs, IADLs; occasional help for cleaning, orders groceries online    OT Problem List: Impaired balance (sitting and/or standing);Decreased activity tolerance   OT Treatment/Interventions: Self-care/ADL  training;Energy conservation;Patient/family education      OT Goals(Current goals can be found in the care plan section)   Acute Rehab OT Goals Patient Stated Goal: go home OT Goal Formulation: With patient Time For Goal Achievement: 11/05/23 Potential to Achieve Goals: Good ADL Goals Pt Will Perform Lower Body Bathing: with modified independence Pt Will Perform Lower Body Dressing: with modified independence Pt Will Transfer to Toilet: with modified independence Pt Will Perform Tub/Shower Transfer: with modified independence;shower seat Additional ADL Goal #1: Pt will verblize and demo 3 energy conservation strategies for ADLs and ADL mobility   OT Frequency:  Min 1X/week    Co-evaluation              AM-PAC OT "6 Clicks" Daily Activity     Outcome Measure Help from another person eating meals?: None Help from another person taking care of personal grooming?: A Little Help from another person toileting, which includes using toliet, bedpan, or urinal?: A Little Help from another person bathing (including washing, rinsing, drying)?: A Little Help from another person to put on and taking off regular upper body clothing?: A Little Help from another person to put on and taking off regular lower body clothing?: A Little 6 Click Score: 19   End  of Session Equipment Utilized During Treatment: Gait belt Nurse Communication: Mobility status  Activity Tolerance: Patient tolerated treatment well Patient left: in chair;with call bell/phone within reach;with chair alarm set  OT Visit Diagnosis: Other abnormalities of gait and mobility (R26.89);Muscle weakness (generalized) (M62.81)                Time: 0981-1914 OT Time Calculation (min): 28 min Charges:  OT General Charges $OT Visit: 1 Visit OT Evaluation $OT Eval Low Complexity: 1 Low OT Treatments $Therapeutic Activity: 8-22 mins    Alfred Ann 10/22/2023, 12:53 PM

## 2023-10-22 NOTE — Progress Notes (Signed)
 Transition of Care [facility]) - Inpatient Brief Assessment   Patient Details  Name: Dana Pruitt MRN: [id number] Date of Birth: 26-Nov-1940  Transition of Care [facility]) CM/SW Contact:    Dane Dung, RN Phone Number: 10/22/2023, 2:20 PM   Clinical Narrative: CM met with the patient at the bedside to discuss TOC needs to return home.  The patient admitted for SOB and pulmonary issues.  Patient is on home oxygen  at 4L/min [place] through [employer].  Patient is currently active with [facility] for RN, PT, OT.  Home health orders placed to be co-signed by the MD  Patient plans to have friend provide transportation to home.  Portable oxygen  tank will be picked up by the friend prior to taking her home when she is stable.   Transition of Care Asessment: Insurance and Status: (P) Insurance coverage has been reviewed Patient has primary care physician: (P) Yes Home environment has been reviewed: (P) from home with spouse Prior level of function:: (P) rolator Prior/Current Home Services: (P) No current home services (Active with [facility] for Pt, OT, RN) Social Drivers of Health Review: (P) SDOH reviewed interventions complete Readmission risk has been reviewed: (P) Yes Transition of care needs: (P) transition of care needs identified, TOC will continue to follow

## 2023-10-22 NOTE — Plan of Care (Signed)

## 2023-10-22 NOTE — Progress Notes (Signed)
 PROGRESS NOTE  Dana Pruitt    DOB: 1940/08/26, 83 y.o.  ZOX:[id number]    Code Status: Full Code   DOA: 10/21/2023   LOS: 1   Brief hospital course   Dana Pruitt is a 83 y.o. female with medical history significant of COPD on 4L, systolic dysfunction CHF, anemia of chronic disease, history of breast cancer, coronary artery disease, history of aortic valve replacement, depression, GERD, paroxysmal atrial fibrillation, hyperlipidemia, essential hypertension who presents to the ER with worsening shortness of breath and cough.   ED course: required 6L [place] to maintain >88%.  Chest x-ray did not show significant pulmonary edema.  She had expiratory wheezing and poor air movement.  Appears to have acute exacerbation of COPD.  10/22/23 -requiring HFNC due to desaturations with small amounts of exertion.   Assessment & Plan  Principal Problem:   Acute on chronic respiratory failure with hypoxemia (HCC) Active Problems:   PAF (paroxysmal atrial fibrillation) (HCC)   COPD  GOLD II   Depression   HLD (hyperlipidemia)   Essential hypertension   Chronic systolic CHF (congestive heart failure) (HCC)   Pulmonary hypertension (HCC)   S/P AVR (aortic valve replacement)   acute on chronic hypoxic respiratory failure:  baseline 4L. Currently 6L HFNC with desat on minimal exertion  acute exacerbation of COPD:  - Continue steroids, nebulizer, antibiotics - supportive care with sudafed, mucinex  - RVP negative - ambulate with pulse ox   paroxysmal atrial fibrillation: Rate is controlled.  Continue anticoagulation.  Continue rate control medications.   hyperlipidemia: Continue with statin    essential hypertension: Continue home regimen   chronic systolic CHF: Appears compensated.  Continue with diuresis   pulmonary hypertension: Continue with home regimen.  Oxygen .   depression with anxiety: Continue home regimen  Body mass index is 20.08 kg/m.  VTE ppx: apixaban  (ELIQUIS ) tablet 2.5  mg Start: 10/21/23 2200 apixaban  (ELIQUIS ) tablet 2.5 mg   Diet:     Diet   Diet Heart Room service appropriate? Yes; Fluid consistency: Thin   Consultants: None   Subjective 10/22/23    Pt reports feeling improved. Still SOB with exertion. Endorses sinus congestion   Objective   Vitals:   10/22/23 0622 10/22/23 0650 10/22/23 0750 10/22/23 0757  BP:      Pulse:   69   Resp:   20   Temp:      TempSrc:      SpO2: 97% 91% 93% 93%  Weight:      Height:        Intake/Output Summary (Last 24 hours) at 10/22/2023 0803 Last data filed at 10/22/2023 0336 Gross per 24 hour  Intake 250 ml  Output 1000 ml  Net -750 ml   Filed Weights   10/21/23 1322  Weight: 53.1 kg    Physical Exam:  General: awake, alert, NAD HEENT: atraumatic, clear conjunctiva, anicteric sclera, MMM, hearing grossly normal Respiratory: normal respiratory effort. Cardiovascular: quick capillary refill, normal S1/S2, RRR, no JVD, murmurs Nervous: A&O x3. no gross focal neurologic deficits, normal speech Extremities: moves all equally, no edema, normal tone Skin: dry, intact, normal temperature, normal color. No rashes, lesions or ulcers on exposed skin Psychiatry: normal mood, congruent affect  Labs   I have personally reviewed the following labs and imaging studies CBC    Component Value Date/Time   WBC 7.1 10/21/2023 1323   RBC 2.73 (L) 10/21/2023 1323   HGB 8.8 (L) 10/21/2023 1323   HGB 10.7 (L)  09/09/2023 1659   HGB 12.8 11/27/2014 1016   HCT 28.0 (L) 10/21/2023 1323   HCT 31.8 (L) 09/09/2023 1659   HCT 37.1 11/27/2014 1016   PLT 284 10/21/2023 1323   PLT 144 (L) 09/09/2023 1659   MCV 102.6 (H) 10/21/2023 1323   MCV 97 09/09/2023 1659   MCV 92.8 11/27/2014 1016   MCH 32.2 10/21/2023 1323   MCHC 31.4 10/21/2023 1323   RDW 15.7 (H) 10/21/2023 1323   RDW 14.5 09/09/2023 1659   RDW 12.9 11/27/2014 1016   LYMPHSABS 0.7 10/07/2023 1239   LYMPHSABS 1.4 11/27/2014 1016   MONOABS 0.3 10/07/2023  1239   MONOABS 0.4 11/27/2014 1016   EOSABS 0.2 10/07/2023 1239   EOSABS 0.2 11/27/2014 1016   BASOSABS 0.0 10/07/2023 1239   BASOSABS 0.0 11/27/2014 1016      Latest Ref Rng & Units 10/21/2023    1:23 PM 09/14/2023    8:17 AM 09/13/2023    3:46 PM  BMP  Glucose 70 - 99 mg/dL 161  096  045   BUN 8 - 23 mg/dL 17  18  13    Creatinine 0.44 - 1.00 mg/dL 4.09  8.11  9.14   Sodium 135 - 145 mmol/L 136  135  131    131   Potassium 3.5 - 5.1 mmol/L 3.8  3.5  3.5    3.5   Chloride 98 - 111 mmol/L 99  100  98   CO2 22 - 32 mmol/L 25  25    Calcium  8.9 - 10.3 mg/dL 9.7  8.6      CT Angio Chest Pulmonary Embolism (PE) W or WO Contrast Result Date: 10/21/2023 CLINICAL DATA:  Pulmonary embolism (PE) suspected, high prob EXAM: CT ANGIOGRAPHY CHEST WITH CONTRAST TECHNIQUE: Multidetector CT imaging of the chest was performed using the standard protocol during bolus administration of intravenous contrast. Multiplanar CT image reconstructions and MIPs were obtained to evaluate the vascular anatomy. RADIATION DOSE REDUCTION: This exam was performed according to the departmental dose-optimization program which includes automated exposure control, adjustment of the mA and/or kV according to patient size and/or use of iterative reconstruction technique. CONTRAST:  60mL OMNIPAQUE  IOHEXOL  350 MG/ML SOLN COMPARISON:  CTA chest dated 09/13/2023. FINDINGS: Cardiovascular: Satisfactory opacification of the pulmonary arteries to the segmental level. No evidence of pulmonary embolism. Stable mild cardiomegaly. Multivessel coronary artery calcifications. Prior aortic valve replacement. Unchanged aneurysmal dilatation of the ascending thoracic aorta, measuring 4.5 cm. Atherosclerotic calcification of the thoracic aorta and arch branch vessels. Unchanged dilation of the pulmonary trunk measuring 4.1 cm in diameter. Mediastinum/Nodes: No enlarged mediastinal, hilar, or axillary lymph nodes. Thyroid  gland, trachea, and esophagus  demonstrate no significant findings. Lungs/Pleura: Paraseptal and centrilobular emphysema. Biapical pleuroparenchymal scarring. Bilateral bronchiectatic changes with probable mild mucous plugging at the left lung base. The previously noted consolidation in the lingula has nearly resolved. Similar fibrotic changes along the anterior aspect of the right upper lobe. Stable size of a few scattered sub 4 mm pulmonary nodules. No new suspicious or enlarging pulmonary nodule. No pleural effusion or pneumothorax. Upper Abdomen: No acute abnormality. Musculoskeletal: Prior median sternotomy. No acute osseous abnormality. No suspicious osseous lesion. Review of the MIP images confirms the above findings. IMPRESSION: 1. No evidence of pulmonary embolism. 2. Bilateral bronchiectatic changes with mild mucous plugging at the left lung base. The previously noted consolidation in the lingula has nearly resolved. 3. Aortic atherosclerosis with aneurysmal dilation of the ascending thoracic aorta measuring 4.5 cm. Recommend semi-annual  imaging followup by CTA or MRA and referral to cardiothoracic surgery if not already obtained. This recommendation follows 2010 ACCF/AHA/AATS/ACR/ASA/SCA/SCAI/SIR/STS/SVM Guidelines for the Diagnosis and Management of Patients With Thoracic Aortic Disease. Circulation. 2010; 121: Z610-R60. Aortic aneurysm NOS (ICD10-I71.9) 4. Similar dilation of the pulmonary trunk, which can be seen in the setting of pulmonary arterial hypertension. 5. Aortic Atherosclerosis (ICD10-I70.0) and Emphysema (ICD10-J43.9). Electronically Signed   By: Mannie Seek M.D.   On: 10/21/2023 18:01   DG Chest 2 View Result Date: 10/21/2023 CLINICAL DATA:  Shortness of breath. EXAM: CHEST - 2 VIEW COMPARISON:  09/13/2023. FINDINGS: Stable cardiomegaly. Prior median sternotomy and valve prosthesis. Emphysema. Streaky retrocardiac opacities, could reflect atelectasis or infiltrate. The right lung appears clear. No pleural  effusion or pneumothorax. No acute osseous abnormality. IMPRESSION: 1. Streaky retrocardiac opacities could reflect atelectasis or infiltrate. 2. Emphysema. Electronically Signed   By: Mannie Seek M.D.   On: 10/21/2023 15:02    Disposition Plan & Communication  Patient status: Inpatient  Admitted From: Home Planned disposition location: Home health Anticipated discharge date: 5/3 pending oxygen  wean  Family Communication: none at bedside    Author: Ree Candy, DO [facility] 10/22/2023, 8:03 AM   Available by Epic secure chat 7AM-7PM. If 7PM-7AM, please contact night-coverage.  [facility] contact information found on [email or web address].

## 2023-10-22 NOTE — Progress Notes (Signed)
 Pt desat to 83-84% on 6L/[place] with minimal exertion/minimal movement, pt denies SOB, but pt appears dyspneic. Evette Hoes, Md notified via secure chat and per MD- will review chart. Report endorsed to day -RN- Gabriel John.

## 2023-10-22 NOTE — Evaluation (Signed)
 Physical Therapy Evaluation Patient Details Name: Dana Pruitt MRN: [id number] DOB: Mar 23, 1941 Today's Date: 10/22/2023  History of Present Illness  83 y.o. female admitted 10/21/23 with worsening SOB, cough, hypoxia. Workup for COPD exacerbation. Of note, recent admits 08/30/23 and 09/13/23 with flu, PNA. Other PMH includes COPD, CHF, CAD, HTN, HLD, PAF, s/p AVR, CA, breast CA.   Clinical Impression  Pt presents with an overall decrease in functional mobility secondary to above. PTA, pt typically indep and lives with husband who she helps care for; since recent admit 08/2023, pt has been working with HHPT, intermittent use of walker for mobility. Today, pt able to transfer and ambulate with intermittent CGA; activity limited by DOE, though pt notes improvement in fatigue and strength since admission. Pt would benefit from continued acute PT services to maximize functional mobility and independence prior to d/c with HHPT services.     SpO2 84% on 6L O2 HFNC with activity; increase to 92% on 6L with seated rest break     If plan is discharge home, recommend the following: Assistance with cooking/housework;Assist for transportation   Can travel by private vehicle    Yes    Equipment Recommendations None recommended by PT  Recommendations for Other Services   Occupational Therapy; Mobility Specialist    Functional Status Assessment Patient has had a recent decline in their functional status and demonstrates the ability to make significant improvements in function in a reasonable and predictable amount of time.     Precautions / Restrictions Precautions Precautions: Fall;Other (comment) Recall of Precautions/Restrictions: Intact Precaution/Restrictions Comments: watch SpO2 (wears 4L O2 baseline) Restrictions Weight Bearing Restrictions Per Provider Order: No      Mobility  Bed Mobility Overal bed mobility: Independent                  Transfers Overall transfer level: Needs  assistance Equipment used: None Transfers: Sit to/from Stand Sit to Stand: Supervision           General transfer comment: multiple sit<>stands from EOB, BSC and recliner; able to perform repeated sit<>stands without UE support    Ambulation/Gait Ambulation/Gait assistance: Contact guard assist Gait Distance (Feet): 30 Feet Assistive device: None Gait Pattern/deviations: Step-through pattern, Decreased stride length Gait velocity: Decreased     General Gait Details: slow, guarded gait without DME, CGA for balance/lines; pt declines DME use; notable DOE but pt declines significant fatigue  Stairs            Wheelchair Mobility     Tilt Bed    Modified Rankin (Stroke Patients Only)       Balance Overall balance assessment: Needs assistance Sitting-balance support: No upper extremity supported Sitting balance-Leahy Scale: Good Sitting balance - Comments: indep with pericare sitting on BSC   Standing balance support: No upper extremity supported, During functional activity Standing balance-Leahy Scale: Fair                               Pertinent Vitals/Pain Pain Assessment Pain Assessment: No/denies pain    Home Living Family/patient expects to be discharged to:: Private residence Living Arrangements: Spouse/significant other Available Help at Discharge: Family;Available PRN/intermittently Type of Home: House Home Access: Stairs to enter Entrance Stairs-Rails: Can reach both Entrance Stairs-Number of Steps: 2 Alternate Level Stairs-Number of Steps: stair lift recently installed (has 15 steps to bedroom) Home Layout: Two level;Able to live on main level with bedroom/bathroom;Bed/bath upstairs Home Equipment: Grab bars -  tub/shower;Grab bars - toilet;Shower seat - built [employer] fundraiser (2 wheels);Rollator (4 wheels) Additional Comments: wears 4L O2 [place] baseline. 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 reports intermittent use of RW and working with HHPT since recent admission. denies h/o falls. states HHPT had started having her use rollator ADLs Comments: indep ADL/iADLs; occasional help for cleaning     Extremity/Trunk Assessment   Upper Extremity Assessment Upper Extremity Assessment: Overall WFL for tasks assessed    Lower Extremity Assessment Lower Extremity Assessment: Generalized weakness (gross strength >3/5)       Communication   Communication Communication: No apparent difficulties    Cognition Arousal: Alert Behavior During Therapy: WFL for tasks assessed/performed   PT - Cognitive impairments: No apparent impairments                         Following commands: Intact       Cueing       General Comments General comments (skin integrity, edema, etc.): urinary urgency which pt attributes to Lasix . SpO2 84% on 6L O2 HFNC, return to >/88% with seated rest break. educ re: role of acute PT, POC, activity recommendations, energy conservation strategies; increased time discussing safe d/c plan (SNF vs continued HHPT); pt in agreement with return home, plans to look into having increased assist for husband so she can focus on her own recovery    Exercises Other Exercises Other Exercises: 30-sec sit-to-stand without use of UEs - pt performed 11 reps   Assessment/Plan    PT Assessment Patient needs continued PT services  PT Problem List Decreased strength;Decreased activity tolerance;Decreased balance;Decreased mobility;Cardiopulmonary status limiting activity       PT Treatment Interventions DME instruction;Gait training;Stair training;Functional mobility training;Therapeutic activities;Therapeutic exercise;Balance training;Patient/family education    PT Goals (Current goals can be found in the Care Plan  section)  Acute Rehab PT Goals Patient Stated Goal: return home with continued HHPT services PT Goal Formulation: With patient Time For Goal Achievement: 11/05/23 Potential to Achieve Goals: Good    Frequency Min 1X/week     Co-evaluation               AM-PAC PT "6 Clicks" Mobility  Outcome Measure Help needed turning from your back to your side while in a flat bed without using bedrails?: None Help needed moving from lying on your back to sitting on the side of a flat bed without using bedrails?: None Help needed moving to and from a bed to a chair (including a wheelchair)?: A Little Help needed standing up from a chair using your arms (e.g., wheelchair or bedside chair)?: A Little Help needed to walk in hospital room?: A Little Help needed climbing 3-5 steps with a railing? : A Little 6 Click Score: 20    End of Session Equipment Utilized During Treatment: Oxygen  Activity Tolerance: Patient tolerated treatment well;Other (comment) (limited by DOE) Patient left: in chair;with call bell/phone within reach;with chair alarm set Nurse Communication: Mobility status PT Visit Diagnosis: Other abnormalities of gait and mobility (R26.89);Muscle weakness (generalized) (M62.81)    Time: 4098-1191 PT Time Calculation (min) (ACUTE ONLY): 33 min   Charges:   PT Evaluation $PT Eval Low Complexity: 1 Low PT Treatments $Therapeutic Activity: 8-22 mins PT General Charges $$  ACUTE PT VISIT: 1 Visit       Blase Bur, PT, DPT Acute Rehabilitation Services  Personal: Secure Chat Rehab Office: [phone]  Albino Hum 10/22/2023, 12:39 PM

## 2023-10-23 ENCOUNTER — Inpatient Hospital Stay (HOSPITAL_COMMUNITY)

## 2023-10-23 DIAGNOSIS — J9621 Acute and chronic respiratory failure with hypoxia: Secondary | ICD-10-CM | POA: Diagnosis not present

## 2023-10-23 LAB — BRAIN NATRIURETIC PEPTIDE: B Natriuretic Peptide: 928.4 pg/mL — ABNORMAL HIGH (ref 0.0–100.0)

## 2023-10-23 LAB — PROCALCITONIN: Procalcitonin: <0.1 ng/mL

## 2023-10-23 MED ORDER — SERTRALINE HCL 100 MG PO TABS
200.0000 mg | ORAL_TABLET | Freq: Every day | ORAL | Status: DC
  Administered 2023-10-23 – 2023-10-25 (×3): 200 mg via ORAL
  Filled 2023-10-23 (×3): qty 2

## 2023-10-23 MED ORDER — FUROSEMIDE 10 MG/ML IJ SOLN
20.0000 mg | Freq: Once | INTRAMUSCULAR | Status: AC
  Administered 2023-10-23: 20 mg via INTRAVENOUS
  Filled 2023-10-23: qty 2

## 2023-10-23 MED ORDER — FLUTICASONE PROPIONATE 50 MCG/ACT NA SUSP
1.0000 | Freq: Every day | NASAL | Status: DC
  Administered 2023-10-23 – 2023-10-25 (×3): 1 via NASAL
  Filled 2023-10-23: qty 16

## 2023-10-23 MED ORDER — LOSARTAN POTASSIUM 50 MG PO TABS
25.0000 mg | ORAL_TABLET | Freq: Every day | ORAL | Status: DC
  Administered 2023-10-23 – 2023-10-25 (×2): 25 mg via ORAL
  Filled 2023-10-23 (×3): qty 1

## 2023-10-23 MED ORDER — DOXYCYCLINE HYCLATE 100 MG PO TABS
100.0000 mg | ORAL_TABLET | Freq: Two times a day (BID) | ORAL | Status: DC
  Administered 2023-10-23 – 2023-10-25 (×4): 100 mg via ORAL
  Filled 2023-10-23 (×4): qty 1

## 2023-10-23 MED ORDER — EZETIMIBE 10 MG PO TABS
10.0000 mg | ORAL_TABLET | Freq: Every day | ORAL | Status: DC
  Administered 2023-10-23 – 2023-10-25 (×3): 10 mg via ORAL
  Filled 2023-10-23 (×3): qty 1

## 2023-10-23 MED ORDER — FERROUS SULFATE 325 (65 FE) MG PO TABS
325.0000 mg | ORAL_TABLET | ORAL | Status: DC
  Administered 2023-10-23 – 2023-10-25 (×2): 325 mg via ORAL
  Filled 2023-10-23 (×3): qty 1

## 2023-10-23 MED ORDER — BUDESONIDE 0.25 MG/2ML IN SUSP
0.2500 mg | Freq: Two times a day (BID) | RESPIRATORY_TRACT | Status: DC
  Administered 2023-10-23 – 2023-10-25 (×5): 0.25 mg via RESPIRATORY_TRACT
  Filled 2023-10-23 (×5): qty 2

## 2023-10-23 MED ORDER — ARFORMOTEROL TARTRATE 15 MCG/2ML IN NEBU
15.0000 ug | INHALATION_SOLUTION | Freq: Two times a day (BID) | RESPIRATORY_TRACT | Status: DC
  Administered 2023-10-23 – 2023-10-25 (×5): 15 ug via RESPIRATORY_TRACT
  Filled 2023-10-23 (×5): qty 2

## 2023-10-23 MED ORDER — REVEFENACIN 175 MCG/3ML IN SOLN
175.0000 ug | Freq: Every day | RESPIRATORY_TRACT | Status: DC
  Administered 2023-10-23 – 2023-10-25 (×3): 175 ug via RESPIRATORY_TRACT
  Filled 2023-10-23 (×3): qty 3

## 2023-10-23 MED ORDER — FUROSEMIDE 10 MG/ML IJ SOLN
20.0000 mg | Freq: Two times a day (BID) | INTRAMUSCULAR | Status: DC
  Administered 2023-10-23 – 2023-10-25 (×4): 20 mg via INTRAVENOUS
  Filled 2023-10-23 (×4): qty 2

## 2023-10-23 NOTE — Hospital Course (Addendum)
 73 yof with medical history significant of COPD on 4L, systolic dysfunction CHF, anemia of chronic disease, history of breast cancer, coronary artery disease, history of aortic valve replacement, depression, GERD, paroxysmal atrial fibrillation, hyperlipidemia, essential hypertension who presents to the ER with worsening shortness of breath and cough and in ED required 6L [place] to maintain >88%.  CXR> streaky retrocardiac opacities atelectasis or infiltrate, emphysema .  CTA 5/1>>no PE. She had expiratory wheezing and poor air movement and admitted for acute on chronic hypoxic respiratory failure   Subjective: Seen examined Feels well, no cp sob fever chills Overnight afebrile oxygen  down to 3.5 [place] Labs with hypokalemia 3.2 creatinine down to 1.2 CBC stable Pro-Cal less than 0.1 Repeat chest x-ray stable Has not been walking much- wants to have another walking and also check her o2 on walkin  Assessment and plan:  Acute on chronic hypoxic respiratory failure- PTA on 4L  Acute exacerbation of COPD:  Workup with BNP 758 elevated procalcitonin < 0.1 no leukocytosis, RSV COVID flu negative, CTA neg for PE. Managing with systemic steroid, DuoNeb, doxycycline  Mucinex . Repeat chest x-ray 5/3 w/ RML b/l basal opacity see below. On beztri at home- added Yupelri  DuoNeb Pulmicort  Respiratory status overall improving.  Acute on chronic systolic CHF EF 30-35% IN MARCH 2025 on admit elevated BNP 758 No significant pulmonary edema on CTA/chest x-ray. PTA GDMt cont losartan  AND bisoprolol . Not on lasix  at home Recheck BNP up 928, Cxr showed patchy airspace disease in the RML, both lung base likely fluid overload rather than pneumonia- as procal is norma, no leukocytosis or fever> continuing on IV diuresis GDMT ARB, bispprolol Cont to monitor daily I/O,weight. Update Limited echo . Keep on  salt/fluid restricted diet and monitor in tele. Net IO Since Admission: -382 mL [10/24/23 0958]  Filed Weights    10/21/23 1322 10/24/23 0500  Weight: 53.1 kg 54.5 kg   Recent Labs  Lab 10/21/23 1323 10/22/23 0746 10/23/23 1011 10/24/23 0532  BNP 758.3*  --  928.4*  --   BUN 17 18  --  27*  CREATININE 1.27* 1.25*  --  1.21*  K 3.8 3.8  --  3.2*    PAF: Rate is controlled. Continue on Eliquis  and amiodarone  and bisoprolol    Hld: Continue Lipitor continue Zetia    Essential hypertension: BP stable continue bisoprolol ,losartan    Pulmonary hypertension: Cont her oxygen .   Anxiety/depression: Mood stable cont Zoloft .  Aneurysmal dilation of the ascending thoracic aorta measuring 4.5 cm: Incidental finding, needs semi-annual imaging followup by CTA or MRA and referral to cardiothoracic surgery if not already obtained-on discharge will need to provide this instruction.

## 2023-10-23 NOTE — Progress Notes (Signed)
 Mobility Specialist: Progress Note   10/23/23 1258  Mobility  Activity Ambulated with assistance in room  Level of Assistance Contact guard assist, steadying assist  Assistive Device Front wheel walker  Distance Ambulated (ft) 30 ft  Activity Response Tolerated well  Mobility Referral Yes  Mobility visit 1 Mobility  Mobility Specialist Start Time (ACUTE ONLY) 0920  Mobility Specialist Stop Time (ACUTE ONLY) 0939  Mobility Specialist Time Calculation (min) (ACUTE ONLY) 19 min    Pt was agreeable to mobility session - received in bed. SV for bed mobility and STS. CG for ambulation. SpO2 87-91% 4LO2 during ambulation, desat 1x while heading towards the chair but recovered quickly with seated break. No complaints besides the IV site starting to bleed during session. Left in chair with all needs met, call bell in reach.   Deloria Fetch Mobility Specialist Please contact via SecureChat or Rehab office at [phone]

## 2023-10-23 NOTE — Progress Notes (Signed)
 PROGRESS NOTE Dana Pruitt  WUJ:[id number] DOB: [birth year] DOA: 10/21/2023 PCP: Imelda Man, MD  Brief Narrative/Hospital Course: 36 yof with medical history significant of COPD on 4L, systolic dysfunction CHF, anemia of chronic disease, history of breast cancer, coronary artery disease, history of aortic valve replacement, depression, GERD, paroxysmal atrial fibrillation, hyperlipidemia, essential hypertension who presents to the ER with worsening shortness of breath and cough and in ED required 6L [place] to maintain >88%.  CXR> streaky retrocardiac opacities atelectasis or infiltrate, emphysema .  CTA 5/1>>no PE. She had expiratory wheezing and poor air movement and admitted for acute on chronic hypoxic respiratory failure   Subjective: Seen and examined this morning Overnight afebrile BP stable, oxygen  down to 4 L.  Resting comfortably in the bedside chair  has not been walking yet Overall feeling improved   Assessment and plan:  Acute on chronic hypoxic respiratory failure- PTA on 4L [place]:A Acute exacerbation of COPD:  Workup with BNP 758 elevated procalcitonin < 0.1 no leukocytosis, RSV COVID flu negative, CTA neg for PE. Managing with systemic steroid, DuoNeb, doxycycline  Mucinex . Repeat chest x-ray w/ RML b/l basal opacity see below. On beztri at home- added Yupelri  DuoNeb Pulmicort   Chronic systolic CHF with EF 30-35% IN MARCH 2025 Elevated BNP 758 ON ADMIT, No significant pulmonary edema on CTA chest x-ray. Pta ON losartan  AND bisoprolol  and not on lasix  Will check her weight , chest x-ray done added IV Lasix  x 1 chest x-ray shows patchy airspace disease in the right middle lobe and both lung base likely fluid over-load rather than pneumonia-checking procalcitonin, BNP repeat is trending up 928. She is already on doxycycline .  GDMT arb, bispprolol Cont to monitor daily I/O,weight- asked RN to update weight, monitor electrolytes.Keep on  salt/fluid restricted diet and monitor in  tele. Net IO Since Admission: -382 mL [10/23/23 1340]  Filed Weights   10/21/23 1322  Weight: 53.1 kg   Recent Labs  Lab 10/21/23 1323 10/22/23 0746 10/23/23 1011  BNP 758.3*  --  928.4*  BUN 17 18  --   CREATININE 1.27* 1.25*  --   K 3.8 3.8  --     PAF: Rate is controlled. Continue on Eliquis  and amiodarone  and bisoprolol    Hld: Continue Lipitor continue Zetia    Essential hypertension: BP stable continue bisoprolol ,resume losartan    Pulmonary hypertension: Cont her oxygen .   Anxiety/depression: Mood stable resume home Zoloft .  Aneurysmal dilation of the ascending thoracic aorta measuring 4.5 cm: Incidental finding, needs semi-annual imaging followup by CTA or MRA and referral to cardiothoracic surgery if not already obtained-on discharge will need to provide this instruction.    DVT prophylaxis: apixaban  (ELIQUIS ) tablet 2.5 mg Start: 10/21/23 2200 Code Status:   Code Status: Full Code Family Communication: plan of care discussed with patient at bedside. Patient status is: Remains hospitalized because of severity of illness Level of care: Telemetry Medical   Dispo: The patient is from: home w/ husband            Anticipated disposition: TBD 1-2 days, cont PTOT.. Objective: Vitals last 24 hrs: Vitals:   10/23/23 0800 10/23/23 1201 10/23/23 1233 10/23/23 1234  BP: 133/82 (!) 151/91    Pulse: 65 70    Resp: 17 19    Temp: 98.2 F (36.8 C) 98 F (36.7 C)    TempSrc: Oral Oral    SpO2: 94% 97% (!) 85% 90%  Weight:      Height:        Physical  Examination: General exam: alert awake, older than stated age HEENT:Oral mucosa moist, Ear/Nose WNL grossly Respiratory system: B.L clear BS, no use of accessory muscle Cardiovascular system: S1 & S2 +. Gastrointestinal system: Abdomen soft, NT,ND,BS+ Nervous System: Alert, awake,  following commands. Extremities: LE edema neg, warm extremities Skin: No rashes,warm. MSK: Normal muscle bulk/tone.   Data Reviewed:  I have personally reviewed following labs and imaging studies ( see epic result tab) CBC: Recent Labs  Lab 10/21/23 1323 10/22/23 0746  WBC 7.1 5.3  HGB 8.8* 8.3*  HCT 28.0* 25.3*  MCV 102.6* 99.2  PLT 284 282   CMP: Recent Labs  Lab 10/21/23 1323 10/22/23 0746  NA 136 138  K 3.8 3.8  CL 99 100  CO2 25 27  GLUCOSE 141* 145*  BUN 17 18  CREATININE 1.27* 1.25*  CALCIUM  9.7 9.3   GFR: Estimated Creatinine Clearance: 29.6 mL/min (A) (by C-G formula based on SCr of 1.25 mg/dL (H)). Recent Labs  Lab 10/21/23 1323 10/22/23 0746  AST 22 17  ALT 14 13  ALKPHOS 46 42  BILITOT 0.8 0.7  PROT 6.0* 5.9*  ALBUMIN  3.2* 3.1*   No results for input(s): "LIPASE", "AMYLASE" in the last 168 hours. No results for input(s): "AMMONIA" in the last 168 hours. Coagulation Profile: No results for input(s): "INR", "PROTIME" in the last 168 hours. Unresulted Labs (From admission, onward)     Start     Ordered   10/23/23 1043  Procalcitonin  Add-on,   AD       References:    Procalcitonin Lower Respiratory Tract Infection AND Sepsis Procalcitonin Algorithm   10/23/23 1043           Antimicrobials/Microbiology: Anti-infectives (From admission, onward)    Start     Dose/Rate Route Frequency Ordered Stop   10/23/23 2100  doxycycline  (VIBRA -TABS) tablet 100 mg        100 mg Oral Every 12 hours 10/23/23 1012     10/21/23 2030  doxycycline  (VIBRAMYCIN ) 100 mg in sodium chloride  0.9 % 250 mL IVPB  Status:  Discontinued        100 mg 125 mL/hr over 120 Minutes Intravenous Every 12 hours 10/21/23 1932 10/23/23 1012         Component Value Date/Time   SDES BLOOD BLOOD RIGHT ARM 09/13/2023 1535   SPECREQUEST  09/13/2023 1535    BOTTLES DRAWN AEROBIC AND ANAEROBIC Blood Culture adequate volume   CULT  09/13/2023 1535    NO GROWTH 5 DAYS Performed at [facility], [address] [place], [place] [address]    REPTSTATUS 09/18/2023 FINAL 09/13/2023 1535  Medications reviewed:   Scheduled Meds:  amiodarone   200 mg Oral Daily   apixaban   2.5 mg Oral BID   arformoterol   15 mcg Nebulization BID   atorvastatin   20 mg Oral Daily   bisoprolol   2.5 mg Oral Daily   budesonide  (PULMICORT ) nebulizer solution  0.25 mg Nebulization BID   dextromethorphan -guaiFENesin   1 tablet Oral BID   doxycycline   100 mg Oral Q12H   ezetimibe   10 mg Oral Daily   ferrous sulfate   325 mg Oral QODAY   fluticasone   1 spray Each Nare Daily   ipratropium-albuterol   3 mL Nebulization Q6H   losartan   25 mg Oral Daily   multivitamin with minerals  1 tablet Oral Daily   predniSONE   40 mg Oral Q breakfast   revefenacin   175 mcg Nebulization Daily   sertraline   200  mg Oral Daily   thiamine   100 mg Oral Daily   Continuous Infusions:  Lesa Rape, MD [facility] 10/23/2023, 1:40 PM

## 2023-10-23 NOTE — Plan of Care (Signed)

## 2023-10-23 NOTE — Progress Notes (Signed)
 walking o2

## 2023-10-24 ENCOUNTER — Inpatient Hospital Stay (HOSPITAL_COMMUNITY)

## 2023-10-24 DIAGNOSIS — J9621 Acute and chronic respiratory failure with hypoxia: Secondary | ICD-10-CM | POA: Diagnosis not present

## 2023-10-24 LAB — BASIC METABOLIC PANEL WITH GFR
Anion gap: 10 (ref 5–15)
BUN: 27 mg/dL — ABNORMAL HIGH (ref 8–23)
CO2: 26 mmol/L (ref 22–32)
Calcium: 9.1 mg/dL (ref 8.9–10.3)
Chloride: 104 mmol/L (ref 98–111)
Creatinine, Ser: 1.21 mg/dL — ABNORMAL HIGH (ref 0.44–1.00)
GFR, Estimated: 45 mL/min — ABNORMAL LOW (ref >60)
Glucose, Bld: 105 mg/dL — ABNORMAL HIGH (ref 70–99)
Potassium: 3.2 mmol/L — ABNORMAL LOW (ref 3.5–5.1)
Sodium: 140 mmol/L (ref 135–145)

## 2023-10-24 LAB — CBC
HCT: 25.2 % — ABNORMAL LOW (ref 36.0–46.0)
Hemoglobin: 8 g/dL — ABNORMAL LOW (ref 12.0–15.0)
MCH: 32 pg (ref 26.0–34.0)
MCHC: 31.7 g/dL (ref 30.0–36.0)
MCV: 100.8 fL — ABNORMAL HIGH (ref 80.0–100.0)
Platelets: 264 10*3/uL (ref 150–400)
RBC: 2.5 MIL/uL — ABNORMAL LOW (ref 3.87–5.11)
RDW: 15.4 % (ref 11.5–15.5)
WBC: 5.9 10*3/uL (ref 4.0–10.5)
nRBC: 0 % (ref 0.0–0.2)

## 2023-10-24 LAB — GLUCOSE, CAPILLARY: Glucose-Capillary: 259 mg/dL — ABNORMAL HIGH (ref 70–99)

## 2023-10-24 MED ORDER — PREDNISONE 20 MG PO TABS
20.0000 mg | ORAL_TABLET | Freq: Every day | ORAL | Status: DC
  Administered 2023-10-25: 20 mg via ORAL
  Filled 2023-10-24 (×2): qty 1

## 2023-10-24 MED ORDER — IPRATROPIUM-ALBUTEROL 0.5-2.5 (3) MG/3ML IN SOLN
3.0000 mL | Freq: Three times a day (TID) | RESPIRATORY_TRACT | Status: DC
Start: 2023-10-24 — End: 2023-10-24
  Administered 2023-10-24: 3 mL via RESPIRATORY_TRACT
  Filled 2023-10-24: qty 3

## 2023-10-24 MED ORDER — POTASSIUM CHLORIDE CRYS ER 20 MEQ PO TBCR
40.0000 meq | EXTENDED_RELEASE_TABLET | Freq: Once | ORAL | Status: AC
  Administered 2023-10-24: 40 meq via ORAL
  Filled 2023-10-24: qty 2

## 2023-10-24 MED ORDER — ALBUTEROL SULFATE (2.5 MG/3ML) 0.083% IN NEBU: 2.5000 mg | INHALATION_SOLUTION | RESPIRATORY_TRACT | Status: DC | PRN

## 2023-10-24 NOTE — Plan of Care (Signed)

## 2023-10-24 NOTE — Progress Notes (Addendum)
 Mobility Specialist: Progress Note   10/24/23 1100  Mobility  Activity Ambulated with assistance in hallway  Level of Assistance Standby assist, set-up cues, supervision of patient - no hands on  Assistive Device Front wheel walker  Distance Ambulated (ft) 300 ft  Activity Response Tolerated well  Mobility Referral Yes  Mobility visit 1 Mobility  Mobility Specialist Start Time (ACUTE ONLY) 1035  Mobility Specialist Stop Time (ACUTE ONLY) 1107  Mobility Specialist Time Calculation (min) (ACUTE ONLY) 32 min    Pre Mobility: SpO2 93% 4LO2 During Mobility: SpO2 83-91% 4LO2, 85-92% 6LO2, 92-95% 8LO2 Post Mobility: SpO2 94% 4LO2  Pt was agreeable to mobility session - received in bed. Completed ambulatory sat test (see following note). Requested to use the BR first. SV throughout. Pericare done ind. No complaints. Returned to room without fault. Left in chair with all needs met, call bell in reach.   Deloria Fetch Mobility Specialist Please contact via SecureChat or Rehab office at [phone]

## 2023-10-24 NOTE — Progress Notes (Signed)
 OT Cancellation Note  Patient Details Name: Dana Pruitt MRN: [id number] DOB: 1941-04-19   Cancelled Treatment:    Reason Eval/Treat Not Completed: Fatigue/lethargy limiting ability to participate.  Attempted skilled OT treatment session. Pt. Politely declines reporting feeling too tired to participate right now.  Pt. States she had walked earlier today with mobility team member,  just returned to bed from using the b.room, and was awaiting new IV placement.  Asks to try tomorrow instead.      Leory Rands [place]-COTA/L  10/24/2023, 2:19 PM  Howell Macintosh, COTA/L Acute Rehabilitation [phone]

## 2023-10-24 NOTE — Progress Notes (Signed)
 PROGRESS NOTE Dana Pruitt  GNF:[id number] DOB: 04-28-1941 DOA: 10/21/2023 PCP: Imelda Man, MD  Brief Narrative/Hospital Course: 68 yof with medical history significant of COPD on 4L, systolic dysfunction CHF, anemia of chronic disease, history of breast cancer, coronary artery disease, history of aortic valve replacement, depression, GERD, paroxysmal atrial fibrillation, hyperlipidemia, essential hypertension who presents to the ER with worsening shortness of breath and cough and in ED required 6L [place] to maintain >88%.  CXR> streaky retrocardiac opacities atelectasis or infiltrate, emphysema .  CTA 5/1>>no PE. She had expiratory wheezing and poor air movement and admitted for acute on chronic hypoxic respiratory failure   Subjective: Seen examined Feels well, no cp sob fever chills Overnight afebrile oxygen  down to 3.5 [place] [facility] with hypokalemia 3.2 creatinine down to 1.2 CBC stable Pro-Cal less than 0.1 Repeat chest x-ray stable Has not been walking much- wants to have another walking and also check her o2 on walkin  Assessment and plan:  Acute on chronic hypoxic respiratory failure- PTA on 4L [place] Acute exacerbation of COPD:  Workup with BNP 758 elevated procalcitonin < 0.1 no leukocytosis, RSV COVID flu negative, CTA neg for PE. Managing with systemic steroid, DuoNeb, doxycycline  Mucinex . Repeat chest x-ray 5/3 w/ RML b/l basal opacity see below. On beztri at home- added Yupelri  DuoNeb Pulmicort  Respiratory status overall improving.  Acute on chronic systolic CHF EF 30-35% IN MARCH 2025 on admit elevated BNP 758 No significant pulmonary edema on CTA/chest x-ray. PTA GDMt cont losartan  AND bisoprolol . Not on lasix  at home Recheck BNP up 928, Cxr showed patchy airspace disease in the RML, both lung base likely fluid overload rather than pneumonia- as procal is norma, no leukocytosis or fever> continuing on IV diuresis GDMT arb, bispprolol Cont to monitor daily I/O,weight. Update  Limited echo . Keep on  salt/fluid restricted diet and monitor in tele. Net IO Since Admission: -382 mL [10/24/23 0958]  Filed Weights   10/21/23 1322 10/24/23 0500  Weight: 53.1 kg 54.5 kg   Recent Labs  Lab 10/21/23 1323 10/22/23 0746 10/23/23 1011 10/24/23 0532  BNP 758.3*  --  928.4*  --   BUN 17 18  --  27*  CREATININE 1.27* 1.25*  --  1.21*  K 3.8 3.8  --  3.2*    PAF: Rate is controlled. Continue on Eliquis  and amiodarone  and bisoprolol    Hld: Continue Lipitor continue Zetia    Essential hypertension: BP stable continue bisoprolol ,losartan    Pulmonary hypertension: Cont her oxygen .   Anxiety/depression: Mood stable cont Zoloft .  Aneurysmal dilation of the ascending thoracic aorta measuring 4.5 cm: Incidental finding, needs semi-annual imaging followup by CTA or MRA and referral to cardiothoracic surgery if not already obtained-on discharge will need to provide this instruction.    DVT prophylaxis: apixaban  (ELIQUIS ) tablet 2.5 mg Start: 10/21/23 2200 Code Status:   Code Status: Full Code Family Communication: plan of care discussed with patient at bedside. Patient status is: Remains hospitalized because of severity of illness Level of care: Telemetry Medical   Dispo: The patient is from: home w/ husband            Anticipated disposition: home in 24 hrs. Does not feel comfortable going home yet.  Will ambulate and check her respiratory status and oxygen  requirement  Objective: Vitals last 24 hrs: Vitals:   10/24/23 0500 10/24/23 0521 10/24/23 0752 10/24/23 0829  BP:  125/79 109/71   Pulse:  70 67   Resp:  16 18   Temp:  98 F (36.7 C) 97.9 F (36.6 C)   TempSrc:   Oral   SpO2:  98% 97% 93%  Weight: 54.5 kg     Height:        Physical Examination: General exam: alert awake,orientedx3 weak HEENT:Oral mucosa moist, Ear/Nose WNL grossly Respiratory system: Bilaterally diminished BS, clear air entry no wheezing, mild use of accessory  muscle Cardiovascular system: S1 & S2 +, No JVD. Gastrointestinal system: Abdomen soft,NT,ND, BS+ Nervous System: Alert, awake, moving all extremities,and following commands. Extremities: LE edema neg,distal peripheral pulses palpable and warm.  Skin: No rashes,no icterus. MSK: Normal muscle bulk,tone, power   Data Reviewed: I have personally reviewed following labs and imaging studies ( see epic result tab) CBC: Recent Labs  Lab 10/21/23 1323 10/22/23 0746 10/24/23 0532  WBC 7.1 5.3 5.9  HGB 8.8* 8.3* 8.0*  HCT 28.0* 25.3* 25.2*  MCV 102.6* 99.2 100.8*  PLT 284 282 264   CMP: Recent Labs  Lab 10/21/23 1323 10/22/23 0746 10/24/23 0532  NA 136 138 140  K 3.8 3.8 3.2*  CL 99 100 104  CO2 25 27 26   GLUCOSE 141* 145* 105*  BUN 17 18 27*  CREATININE 1.27* 1.25* 1.21*  CALCIUM  9.7 9.3 9.1   GFR: Estimated Creatinine Clearance: 31.4 mL/min (A) (by C-G formula based on SCr of 1.21 mg/dL (H)). Recent Labs  Lab 10/21/23 1323 10/22/23 0746  AST 22 17  ALT 14 13  ALKPHOS 46 42  BILITOT 0.8 0.7  PROT 6.0* 5.9*  ALBUMIN  3.2* 3.1*   No results for input(s): "LIPASE", "AMYLASE" in the last 168 hours. No results for input(s): "AMMONIA" in the last 168 hours. Coagulation Profile: No results for input(s): "INR", "PROTIME" in the last 168 hours. Unresulted Labs (From admission, onward)     Start     Ordered   10/24/23 0500  Basic metabolic panel with GFR  Daily,   R      10/23/23 1341   10/24/23 0500  CBC  Daily,   R      10/23/23 1341           Antimicrobials/Microbiology: Anti-infectives (From admission, onward)    Start     Dose/Rate Route Frequency Ordered Stop   10/23/23 2100  doxycycline  (VIBRA -TABS) tablet 100 mg        100 mg Oral Every 12 hours 10/23/23 1012     10/21/23 2030  doxycycline  (VIBRAMYCIN ) 100 mg in sodium chloride  0.9 % 250 mL IVPB  Status:  Discontinued        100 mg 125 mL/hr over 120 Minutes Intravenous Every 12 hours 10/21/23 1932 10/23/23  1012         Component Value Date/Time   SDES BLOOD BLOOD RIGHT ARM 09/13/2023 1535   SPECREQUEST  09/13/2023 1535    BOTTLES DRAWN AEROBIC AND ANAEROBIC Blood Culture adequate volume   CULT  09/13/2023 1535    NO GROWTH 5 DAYS Performed at [facility], [address], [place], [place] [address]    REPTSTATUS 09/18/2023 FINAL 09/13/2023 1535  Medications reviewed:  Scheduled Meds:  amiodarone   200 mg Oral Daily   apixaban   2.5 mg Oral BID   arformoterol   15 mcg Nebulization BID   atorvastatin   20 mg Oral Daily   bisoprolol   2.5 mg Oral Daily   budesonide  (PULMICORT ) nebulizer solution  0.25 mg Nebulization BID   dextromethorphan -guaiFENesin   1 tablet Oral BID   doxycycline   100 mg Oral Q12H  ezetimibe   10 mg Oral Daily   ferrous sulfate   325 mg Oral QODAY   fluticasone   1 spray Each Nare Daily   furosemide   20 mg Intravenous BID   ipratropium-albuterol   3 mL Nebulization TID   losartan   25 mg Oral Daily   multivitamin with minerals  1 tablet Oral Daily   [START ON 10/25/2023] predniSONE   20 mg Oral Q breakfast   revefenacin   175 mcg Nebulization Daily   sertraline   200 mg Oral Daily   thiamine   100 mg Oral Daily   Continuous Infusions:  Lesa Rape, MD [facility] 10/24/2023, 10:00 AM

## 2023-10-24 NOTE — Progress Notes (Signed)
 Nurse requested Mobility Specialist to perform oxygen  saturation test with pt which includes removing pt from oxygen  both at rest and while ambulating.  Below are the results from that testing.     Patient Saturations on Room Air at Rest = spO2 85%  Patient Saturations on 8 Liters of oxygen  while Ambulating = sp02 92-95%  At end of testing pt left in room on 4  Liters of oxygen .  Reported results to nurse.

## 2023-10-25 ENCOUNTER — Inpatient Hospital Stay (HOSPITAL_COMMUNITY)

## 2023-10-25 DIAGNOSIS — I5021 Acute systolic (congestive) heart failure: Secondary | ICD-10-CM | POA: Diagnosis not present

## 2023-10-25 LAB — ECHOCARDIOGRAM LIMITED
AR max vel: 0.99 cm2
AV Area VTI: 0.95 cm2
AV Area mean vel: 0.9 cm2
AV Mean grad: 10 mmHg
AV Peak grad: 17.7 mmHg
Ao pk vel: 2.11 m/s
Area-P 1/2: 4.21 cm2
Height: 64 in
S' Lateral: 3.4 cm
Weight: 1922.41 [oz_av]

## 2023-10-25 LAB — BASIC METABOLIC PANEL WITH GFR
Anion gap: 9 (ref 5–15)
BUN: 33 mg/dL — ABNORMAL HIGH (ref 8–23)
CO2: 26 mmol/L (ref 22–32)
Calcium: 8.9 mg/dL (ref 8.9–10.3)
Chloride: 104 mmol/L (ref 98–111)
Creatinine, Ser: 1.14 mg/dL — ABNORMAL HIGH (ref 0.44–1.00)
GFR, Estimated: 48 mL/min — ABNORMAL LOW (ref >60)
Glucose, Bld: 112 mg/dL — ABNORMAL HIGH (ref 70–99)
Potassium: 3.7 mmol/L (ref 3.5–5.1)
Sodium: 139 mmol/L (ref 135–145)

## 2023-10-25 LAB — CBC
HCT: 26.4 % — ABNORMAL LOW (ref 36.0–46.0)
Hemoglobin: 8.5 g/dL — ABNORMAL LOW (ref 12.0–15.0)
MCH: 32.2 pg (ref 26.0–34.0)
MCHC: 32.2 g/dL (ref 30.0–36.0)
MCV: 100 fL (ref 80.0–100.0)
Platelets: 287 10*3/uL (ref 150–400)
RBC: 2.64 MIL/uL — ABNORMAL LOW (ref 3.87–5.11)
RDW: 15.2 % (ref 11.5–15.5)
WBC: 6.4 10*3/uL (ref 4.0–10.5)
nRBC: 0.3 % — ABNORMAL HIGH (ref 0.0–0.2)

## 2023-10-25 MED ORDER — PERFLUTREN LIPID MICROSPHERE
1.0000 mL | INTRAVENOUS | Status: AC | PRN
  Administered 2023-10-25: 3 mL via INTRAVENOUS

## 2023-10-25 MED ORDER — PREDNISONE 10 MG PO TABS: ORAL_TABLET | ORAL | 0 refills | Status: DC

## 2023-10-25 NOTE — Progress Notes (Signed)
 Echocardiogram 2D Echocardiogram has been performed.  Dana Pruitt 10/25/2023, 12:59 PM

## 2023-10-25 NOTE — TOC Transition Note (Signed)
 Transition of Care [facility]) - Discharge Note   Patient Details  Name: Dana Pruitt MRN: [id number] Date of Birth: 1940-11-04  Transition of Care [facility]) CM/SW Contact:  Dane Dung, RN Phone Number: 10/25/2023, 2:04 PM   Clinical Narrative:    CM met with the patient at the bedside prior to discharge to home and patient will remain on 4 L/min [place] when she returns home per attending MD.  Patient was on 4L/min [place] at baseline.  Patient has portable oxygen  tank in the hospital room at this time.  I called the patient's PCP and hospital follow up was scheduled for tomorrow with Dr. Estrella Hench office.  [facility] was updated that patient was discharging home today.         Patient Goals and CMS Choice            Discharge Placement                       Discharge Plan and Services Additional resources added to the After Visit Summary for                                       Social Drivers of Health (SDOH) Interventions SDOH Screenings   Food Insecurity: No Food Insecurity (10/21/2023)  Housing: Low Risk  (10/21/2023)  Transportation Needs: No Transportation Needs (10/21/2023)  Utilities: Not At Risk (10/21/2023)  Depression (PHQ2-9): Medium Risk (07/26/2023)  Social Connections: Moderately Integrated (10/21/2023)  Tobacco Use: Medium Risk (10/21/2023)     Readmission Risk Interventions    10/22/2023    2:19 PM 09/14/2023    1:01 PM  Readmission Risk Prevention Plan  Transportation Screening Complete Complete  PCP or Specialist Appt within 3-5 Days Complete   HRI or Home Care Consult Complete Complete  Social Work Consult for Recovery Care Planning/Counseling Complete Complete  Palliative Care Screening Complete Not Applicable  Medication Review Oceanographer) Complete Referral to Pharmacy

## 2023-10-25 NOTE — Progress Notes (Signed)
 Occupational Therapy Treatment Patient Details Name: Dana Pruitt MRN: [id number] DOB: [birth year] Today's Date: 10/25/2023   History of present illness 83 y.o. female admitted 10/21/23 with worsening SOB, cough, hypoxia. Workup for COPD exacerbation. Of note, recent admits 08/30/23 and 09/13/23 with flu, PNA. Other PMH includes COPD, CHF, CAD, HTN, HLD, PAF, s/p AVR, CA, breast CA.   OT comments  Pt seen for ADLs and energy conservation. Pt at an overall S level without an AD for ambulation in her room. She will continue to benefit from acute OT without need for follow up. Pt had some questions about her O2 at home and HH--secure chat to CM about these.   Sats (4 liters) with activity dropped as low as 82% with 5 reps of purse lipped breathing while seated came up to 92% within 1 minute.  Sats (5 liters) with activity dropped as low as 89% with no reps of purse lipped breathing came back up to 92%.  Ssts (6 liters) with activity dropped as low as 92%.      If plan is discharge home, recommend the following:  Assistance with cooking/housework;Assist for transportation   Equipment Recommendations  None recommended by OT       Precautions / Restrictions Precautions Precautions: Fall Recall of Precautions/Restrictions: Intact Precaution/Restrictions Comments: watch SpO2 (wears 4L O2 baseline) Restrictions Weight Bearing Restrictions Per Provider Order: No       Mobility Bed Mobility Overal bed mobility: Independent             General bed mobility comments: OOB    Transfers Overall transfer level: Needs assistance Equipment used: None Transfers: Sit to/from Stand Sit to Stand: Independent                 Balance Overall balance assessment: Mild deficits observed, not formally tested Sitting-balance support: No upper extremity supported, Feet supported Sitting balance-Leahy Scale: Good     Standing balance support: No upper extremity supported Standing  balance-Leahy Scale: Fair                             ADL either performed or assessed with clinical judgement   ADL Overall ADL's : Needs assistance/impaired                     Lower Body Dressing: Set up;Supervision/safety;Sit to/from stand   Toilet Transfer: Supervision/safety;Ambulation;Regular Toilet;Grab bars Toilet Transfer Details (indicate cue type and reason): simulated bed>door and back 9 times (3 sets of 3 times monitoring O2 sats on 4L,5L, 6L of O2)           General ADL Comments: Educated on energy conservation and gave patient he handout "5 P's of Energy Conservation". Pt aware of purse lipped breathing with education on repetitions.    Extremity/Trunk Assessment Upper Extremity Assessment Upper Extremity Assessment: Overall WFL for tasks assessed            Vision Baseline Vision/History: 0 No visual deficits Patient Visual Report: No change from baseline           Communication Communication Communication: No apparent difficulties   Cognition Arousal: Alert Behavior During Therapy: WFL for tasks assessed/performed Cognition: No apparent impairments  Pertinent Vitals/ Pain       Pain Assessment Pain Assessment: No/denies pain         Frequency  Min 1X/week        Progress Toward Goals  OT Goals(current goals can now be found in the care plan section)  Progress towards OT goals: Progressing toward goals  Acute Rehab OT Goals Patient Stated Goal: to figure out this O2 stuff and go home OT Goal Formulation: With patient Time For Goal Achievement: 11/05/23 Potential to Achieve Goals: Good  Plan         AM-PAC OT "6 Clicks" Daily Activity     Outcome Measure   Help from another person eating meals?: None Help from another person taking care of personal grooming?: A Little (S) Help from another person toileting, which includes using toliet,  bedpan, or urinal?: A Little (S) Help from another person bathing (including washing, rinsing, drying)?: A Little (S) Help from another person to put on and taking off regular upper body clothing?: A Little (S) Help from another person to put on and taking off regular lower body clothing?: A Little (S) 6 Click Score: 19    End of Session Equipment Utilized During Treatment:  (none)  OT Visit Diagnosis: Unsteadiness on feet (R26.81)   Activity Tolerance Patient tolerated treatment well   Patient Left  (sitting EOB eating her breakfast)           Time: 1610-9604 OT Time Calculation (min): 41 min  Charges: OT General Charges $OT Visit: 1 Visit OT Treatments $Self Care/Home Management : 38-52 mins Merryl Abraham OT Acute Rehabilitation Services Office [phone]    Lenox Raider 10/25/2023, 10:24 AM

## 2023-10-25 NOTE — Care Management Important Message (Signed)
 Important Message  Patient Details  Name: Dana Pruitt MRN: [id number] Date of Birth: [birth year]   Important Message Given:        Wynonia Hedges 10/25/2023, 4:18 PM

## 2023-10-25 NOTE — Discharge Summary (Signed)
 Dana Pruitt:[id number] DOB: 1941-01-29 DOA: 10/21/2023  PCP: Imelda Man, MD  Admit date: 10/21/2023 Discharge date: 10/25/2023  Time spent: 35 minutes  Recommendations for Outpatient Follow-up:  Close pcp f/u F/u results of TTE (pending at time of d/c) Outpatient surveillance of incidental ascending thoracic aortic aneurysm Pulmonology f/u 2 weeks as scheduled     Discharge Diagnoses:  Principal Problem:   Acute on chronic respiratory failure with hypoxemia (HCC) Active Problems:   PAF (paroxysmal atrial fibrillation) (HCC)   COPD  GOLD II   Depression   HLD (hyperlipidemia)   Essential hypertension   Chronic systolic CHF (congestive heart failure) (HCC)   Pulmonary hypertension (HCC)   S/P AVR (aortic valve replacement)   Protein-calorie malnutrition, severe   Discharge Condition: improved  Diet recommendation: heart healthy  Filed Weights   10/21/23 1322 10/24/23 0500  Weight: 53.1 kg 54.5 kg    History of present illness:  From admission h and p Dana Pruitt is a 83 y.o. female with medical history significant of COPD, systolic dysfunction CHF, anemia of chronic disease, history of breast cancer, coronary artery disease, history of aortic valve replacement, depression, GERD, paroxysmal atrial fibrillation, hyperlipidemia, essential hypertension who presents to the ER with worsening shortness of breath and cough.  Patient's oxygen  saturation was in the 60s on her regular 4 L of oxygen  per minute.  She apparently had recent pneumonia and flu about 2 weeks ago.  Patient also has cough that has not gone away despite treatment.  She is currently on 6 L of oxygen  per minute.  Patient seen and evaluated in the ER.  Chest x-ray did not show significant pulmonary edema.  She had expiratory wheezing and poor air movement.  Appears to have acute exacerbation of COPD.  Patient being admitted to the hospital for further evaluation and treatment.    Hospital Course:   Patient presents with shortness of breath. CTA showed no PE or pulm edema. Procal low and no focal infiltrate, unlikely pneumonia. Was treated for copd exacerbation with doxycycline  and corticosteroid and patient now much improved. Required 6 liters initially now weaned down to her home 4 liters. Evaluated by PT and cleared for d/c home with home health which was ordered. TTE performed day of discharge but patient denies LE edema, no pulm edema on CT/cxr, and had recent TTE as outpatient, she is comfortable with d/c home pending formal read of echo, I will contact her if any new acute abnormalities. Other chronic medical problems stable though CT did show incidental ascending thoracic aorta aneurysm that will need outpatient attention for monitoring.   Procedures: none   Consultations: none  Discharge Exam: Vitals:   10/25/23 0756 10/25/23 0804  BP: (!) 152/89   Pulse: 72 70  Resp: 18 18  Temp: 100 F (37.8 C)   SpO2: 94% 91%    General: NAD Cardiovascular: RRR Respiratory: few rales at bases otherwise clear Ext: warm, no edema  Discharge Instructions   Discharge Instructions     Diet - low sodium heart healthy   Complete by: As directed    Increase activity slowly   Complete by: As directed       Allergies as of 10/25/2023   No Known Allergies      Medication List     TAKE these medications    albuterol  108 (90 Base) MCG/ACT inhaler Commonly known as: ProAir  HFA 2 puffs every 4 hours as needed only  if your can't catch your breath  What changed:  how much to take how to take this when to take this reasons to take this additional instructions   albuterol  (2.5 MG/3ML) 0.083% nebulizer solution Commonly known as: PROVENTIL  Take 3 mLs (2.5 mg total) by nebulization every 6 (six) hours as needed for wheezing or shortness of breath. What changed: Another medication with the same name was changed. Make sure you understand how and when to take each.   amiodarone   200 MG tablet Commonly known as: PACERONE  Take 1 tablet (200 mg total) by mouth daily.   apixaban  2.5 MG Tabs tablet Commonly known as: Eliquis  Take 1 tablet (2.5 mg total) by mouth 2 (two) times daily.   atorvastatin  20 MG tablet Commonly known as: LIPITOR Take 20 mg by mouth daily.   bisoprolol  5 MG tablet Commonly known as: ZEBETA  Take 0.5 tablets (2.5 mg total) by mouth daily.   Breztri  Aerosphere 160-9-4.8 MCG/ACT Aero inhaler Generic drug: budeson-glycopyrrolate -formoterol  INHALE 2 PUFFS INTO THE LUNGS TWICE DAILY   ezetimibe  10 MG tablet Commonly known as: ZETIA  Take 10 mg by mouth daily.   FeroSul 325 (65 Fe) MG tablet Generic drug: ferrous sulfate  Take 325 mg by mouth every other day.   fluticasone  50 MCG/ACT nasal spray Commonly known as: FLONASE  SHAKE LIQUID AND USE 2 SPRAYS IN EACH NOSTRIL DAILY   guaiFENesin  600 MG 12 hr tablet Commonly known as: MUCINEX  Take 1 tablet (600 mg total) by mouth 2 (two) times daily.   guaiFENesin -dextromethorphan  100-10 MG/5ML syrup Commonly known as: ROBITUSSIN DM Take 5 mLs by mouth every 4 (four) hours as needed for cough.   ipratropium 0.06 % nasal spray Commonly known as: ATROVENT  Place 2 sprays into both nostrils 4 (four) times daily.   losartan  25 MG tablet Commonly known as: COZAAR  Take 25 mg by mouth daily.   OXYGEN  Inhale 4 L/min into the lungs continuous.   predniSONE  10 MG tablet Commonly known as: DELTASONE  3 tabs daily for 3 days then 2 tabs daily for 3 days then 1 tab daily for 3 days What changed: additional instructions   sertraline  100 MG tablet Commonly known as: ZOLOFT  Take 200 mg by mouth daily.   valACYclovir 1000 MG tablet Commonly known as: VALTREX Take 500 mg by mouth daily as needed (cold sores).   Vitamin D3 25 MCG (1000 UT) Caps Take 1,000 Units by mouth daily.       No Known Allergies  Follow-up Information     [facility] Follow up.   Specialty:  Home Health Services Why: [facility] will continue to provide home health services. Contact information: [address] [place] [address] [phone]         [facility] Follow up.   Why: [facility] will continue to provide home oxygen . Contact information: [address] [place] [address] [place] [address] [phone]         Imelda Man, MD Follow up.   Specialty: Internal Medicine Contact information: [address] [place] [address] [phone]                  The results of significant diagnostics from this hospitalization (including imaging, microbiology, ancillary and laboratory) are listed below for reference.    Significant Diagnostic Studies: DG Chest Port 1 View Result Date: 10/24/2023 CLINICAL DATA:  83 year old female with hypoxia, CHF. EXAM: PORTABLE CHEST 1 VIEW COMPARISON:  Portable chest 10/23/2023 and earlier. FINDINGS: Portable AP semi  upright view at 0633 hours. Mildly rotated to the left now. Stable cardiac size and mediastinal contours. Previous sternotomy and cardiac valve replacement. Calcified aortic atherosclerosis. Visualized tracheal air column is within normal limits. Lower lung predominant increased interstitial markings with emphysema on CT this month. No superimposed pneumothorax, pulmonary edema. Streaky retrocardiac opacity appears stable and related to atelectasis or scarring on the recent CT. No definite progressed lung opacity when allowing for rotation, and also when compared to chest radiograph on 10/21/2023. No evidence of pleural effusion. Stable visualized osseous structures. Paucity of bowel gas. IMPRESSION: Mildly rotated portable chest with emphysema, stable left lung base atelectasis/scarring. No acute cardiopulmonary abnormality. Electronically Signed   By: Marlise Simpers M.D.   On: 10/24/2023 08:42   DG Chest Port 1 View Result Date: 10/23/2023 CLINICAL DATA:  Hypoxia. EXAM:  PORTABLE CHEST 1 VIEW COMPARISON:  10/21/2023 FINDINGS: Lungs are hyperexpanded. Patchy airspace disease is seen in the right mid lung in both lung bases, suspicious for multifocal pneumonia. Interstitial markings are diffusely coarsened with chronic features. Cardiopericardial silhouette is at upper limits of normal for size. No acute bony abnormality. Telemetry leads overlie the chest. IMPRESSION: Patchy airspace disease in the right mid lung and both lung bases, suspicious for multifocal pneumonia. Electronically Signed   By: Donnal Fusi M.D.   On: 10/23/2023 09:24   CT Angio Chest Pulmonary Embolism (PE) W or WO Contrast Result Date: 10/21/2023 CLINICAL DATA:  Pulmonary embolism (PE) suspected, high prob EXAM: CT ANGIOGRAPHY CHEST WITH CONTRAST TECHNIQUE: Multidetector CT imaging of the chest was performed using the standard protocol during bolus administration of intravenous contrast. Multiplanar CT image reconstructions and MIPs were obtained to evaluate the vascular anatomy. RADIATION DOSE REDUCTION: This exam was performed according to the departmental dose-optimization program which includes automated exposure control, adjustment of the mA and/or kV according to patient size and/or use of iterative reconstruction technique. CONTRAST:  60mL OMNIPAQUE  IOHEXOL  350 MG/ML SOLN COMPARISON:  CTA chest dated 09/13/2023. FINDINGS: Cardiovascular: Satisfactory opacification of the pulmonary arteries to the segmental level. No evidence of pulmonary embolism. Stable mild cardiomegaly. Multivessel coronary artery calcifications. Prior aortic valve replacement. Unchanged aneurysmal dilatation of the ascending thoracic aorta, measuring 4.5 cm. Atherosclerotic calcification of the thoracic aorta and arch branch vessels. Unchanged dilation of the pulmonary trunk measuring 4.1 cm in diameter. Mediastinum/Nodes: No enlarged mediastinal, hilar, or axillary lymph nodes. Thyroid  gland, trachea, and esophagus demonstrate no  significant findings. Lungs/Pleura: Paraseptal and centrilobular emphysema. Biapical pleuroparenchymal scarring. Bilateral bronchiectatic changes with probable mild mucous plugging at the left lung base. The previously noted consolidation in the lingula has nearly resolved. Similar fibrotic changes along the anterior aspect of the right upper lobe. Stable size of a few scattered sub 4 mm pulmonary nodules. No new suspicious or enlarging pulmonary nodule. No pleural effusion or pneumothorax. Upper Abdomen: No acute abnormality. Musculoskeletal: Prior median sternotomy. No acute osseous abnormality. No suspicious osseous lesion. Review of the MIP images confirms the above findings. IMPRESSION: 1. No evidence of pulmonary embolism. 2. Bilateral bronchiectatic changes with mild mucous plugging at the left lung base. The previously noted consolidation in the lingula has nearly resolved. 3. Aortic atherosclerosis with aneurysmal dilation of the ascending thoracic aorta measuring 4.5 cm. Recommend semi-annual imaging followup by CTA or MRA and referral to cardiothoracic surgery if not already obtained. This recommendation follows 2010 ACCF/AHA/AATS/ACR/ASA/SCA/SCAI/SIR/STS/SVM Guidelines for the Diagnosis and Management of Patients With Thoracic Aortic Disease. Circulation. 2010; 121: H846-N62. Aortic aneurysm NOS (ICD10-I71.9) 4. Similar dilation  of the pulmonary trunk, which can be seen in the setting of pulmonary arterial hypertension. 5. Aortic Atherosclerosis (ICD10-I70.0) and Emphysema (ICD10-J43.9). Electronically Signed   By: Mannie Seek M.D.   On: 10/21/2023 18:01   DG Chest 2 View Result Date: 10/21/2023 CLINICAL DATA:  Shortness of breath. EXAM: CHEST - 2 VIEW COMPARISON:  09/13/2023. FINDINGS: Stable cardiomegaly. Prior median sternotomy and valve prosthesis. Emphysema. Streaky retrocardiac opacities, could reflect atelectasis or infiltrate. The right lung appears clear. No pleural effusion or  pneumothorax. No acute osseous abnormality. IMPRESSION: 1. Streaky retrocardiac opacities could reflect atelectasis or infiltrate. 2. Emphysema. Electronically Signed   By: Mannie Seek M.D.   On: 10/21/2023 15:02    Microbiology: Recent Results (from the past 240 hours)  Resp panel by RT-PCR (RSV, Flu A&B, Covid) Anterior Nasal Swab     Status: None   Collection Time: 10/21/23  1:28 PM   Specimen: Anterior Nasal Swab  Result Value Ref Range Status   SARS Coronavirus 2 by RT PCR NEGATIVE NEGATIVE Final   Influenza A by PCR NEGATIVE NEGATIVE Final   Influenza B by PCR NEGATIVE NEGATIVE Final    Comment: (NOTE) The Xpert Xpress SARS-CoV-2/FLU/RSV plus assay is intended as an aid in the diagnosis of influenza from Nasopharyngeal swab specimens and should not be used as a sole basis for treatment. Nasal washings and aspirates are unacceptable for Xpert Xpress SARS-CoV-2/FLU/RSV testing.  Fact Sheet for Patients: BloggerCourse.com  Fact Sheet for Healthcare Providers: SeriousBroker.it  This test is not yet approved or cleared by the [place]  FDA and has been authorized for detection and/or diagnosis of SARS-CoV-2 by FDA under an Emergency Use Authorization (EUA). This EUA will remain in effect (meaning this test can be used) for the duration of the COVID-19 declaration under Section 564(b)(1) of the Act, 21 U.S.C. section 360bbb-3(b)(1), unless the authorization is terminated or revoked.     Resp Syncytial Virus by PCR NEGATIVE NEGATIVE Final    Comment: (NOTE) Fact Sheet for Patients: BloggerCourse.com  Fact Sheet for Healthcare Providers: SeriousBroker.it  This test is not yet approved or cleared by the [place]  FDA and has been authorized for detection and/or diagnosis of SARS-CoV-2 by FDA under an Emergency Use Authorization (EUA). This EUA will remain in effect  (meaning this test can be used) for the duration of the COVID-19 declaration under Section 564(b)(1) of the Act, 21 U.S.C. section 360bbb-3(b)(1), unless the authorization is terminated or revoked.  Performed at [facility], [address], [place], [place] [address]   Respiratory (~20 pathogens) panel by PCR     Status: None   Collection Time: 10/22/23  9:35 AM   Specimen: Nasopharyngeal Swab; Respiratory  Result Value Ref Range Status   Adenovirus NOT DETECTED NOT DETECTED Final   Coronavirus 229E NOT DETECTED NOT DETECTED Final    Comment: (NOTE) The Coronavirus on the Respiratory Panel, DOES NOT test for the novel  Coronavirus (2019 nCoV)    Coronavirus HKU1 NOT DETECTED NOT DETECTED Final   Coronavirus NL63 NOT DETECTED NOT DETECTED Final   Coronavirus OC43 NOT DETECTED NOT DETECTED Final   Metapneumovirus NOT DETECTED NOT DETECTED Final   Rhinovirus / Enterovirus NOT DETECTED NOT DETECTED Final   Influenza A NOT DETECTED NOT DETECTED Final   Influenza B NOT DETECTED NOT DETECTED Final   Parainfluenza Virus 1 NOT DETECTED NOT DETECTED Final   Parainfluenza Virus 2 NOT DETECTED NOT DETECTED Final   Parainfluenza Virus 3 NOT DETECTED NOT  DETECTED Final   Parainfluenza Virus 4 NOT DETECTED NOT DETECTED Final   Respiratory Syncytial Virus NOT DETECTED NOT DETECTED Final   Bordetella pertussis NOT DETECTED NOT DETECTED Final   Bordetella Parapertussis NOT DETECTED NOT DETECTED Final   Chlamydophila pneumoniae NOT DETECTED NOT DETECTED Final   Mycoplasma pneumoniae NOT DETECTED NOT DETECTED Final    Comment: Performed at [facility], [address], [place], [place] [address]     Labs: Basic Metabolic Panel: Recent Labs  Lab 10/21/23 1323 10/22/23 0746 10/24/23 0532 10/25/23 0423  NA 136 138 140 139  K 3.8 3.8 3.2* 3.7  CL 99 100 104 104  CO2 25 27 26 26   GLUCOSE 141* 145* 105* 112*  BUN 17 18 27* 33*  CREATININE 1.27* 1.25* 1.21* 1.14*  CALCIUM  9.7  9.3 9.1 8.9   Liver Function Tests: Recent Labs  Lab 10/21/23 1323 10/22/23 0746  AST 22 17  ALT 14 13  ALKPHOS 46 42  BILITOT 0.8 0.7  PROT 6.0* 5.9*  ALBUMIN  3.2* 3.1*   No results for input(s): "LIPASE", "AMYLASE" in the last 168 hours. No results for input(s): "AMMONIA" in the last 168 hours. CBC: Recent Labs  Lab 10/21/23 1323 10/22/23 0746 10/24/23 0532 10/25/23 0423  WBC 7.1 5.3 5.9 6.4  HGB 8.8* 8.3* 8.0* 8.5*  HCT 28.0* 25.3* 25.2* 26.4*  MCV 102.6* 99.2 100.8* 100.0  PLT 284 282 264 287   Cardiac Enzymes: No results for input(s): "CKTOTAL", "CKMB", "CKMBINDEX", "TROPONINI" in the last 168 hours. BNP: BNP (last 3 results) Recent Labs    09/13/23 1534 10/21/23 1323 10/23/23 1011  BNP 905.0* 758.3* 928.4*    ProBNP (last 3 results) Recent Labs    08/16/23 1643  PROBNP 473.0*    CBG: Recent Labs  Lab 10/24/23 1942  GLUCAP 259*       Signed:  Raymonde Calico MD.  [facility] 10/25/2023, 1:13 PM

## 2023-10-25 NOTE — Plan of Care (Signed)

## 2023-10-26 DIAGNOSIS — Z09 Encounter for follow-up examination after completed treatment for conditions other than malignant neoplasm: Secondary | ICD-10-CM | POA: Diagnosis not present

## 2023-10-26 DIAGNOSIS — I5020 Unspecified systolic (congestive) heart failure: Secondary | ICD-10-CM | POA: Diagnosis not present

## 2023-10-26 DIAGNOSIS — J441 Chronic obstructive pulmonary disease with (acute) exacerbation: Secondary | ICD-10-CM | POA: Diagnosis not present

## 2023-10-26 NOTE — Transitions of Care (Post Inpatient/ED Visit) (Signed)
 10/26/2023  Patient ID: Dana Pruitt, female   DOB: 01-29-1941, 83 y.o.   MRN: [id number]  Chart Review for transitions of care.  See [employer] for documentation.  Jered Heiny J. Marranda Arakelian RN, MSN [facility], [facility] RN Care Manager Direct Dial: [phone]  Fax: [phone] Website: [email or web address]

## 2023-10-28 ENCOUNTER — Other Ambulatory Visit: Payer: Self-pay | Admitting: Internal Medicine

## 2023-10-28 DIAGNOSIS — I2510 Atherosclerotic heart disease of native coronary artery without angina pectoris: Secondary | ICD-10-CM | POA: Diagnosis not present

## 2023-10-28 DIAGNOSIS — E785 Hyperlipidemia, unspecified: Secondary | ICD-10-CM | POA: Diagnosis not present

## 2023-10-28 DIAGNOSIS — I081 Rheumatic disorders of both mitral and tricuspid valves: Secondary | ICD-10-CM | POA: Diagnosis not present

## 2023-10-28 DIAGNOSIS — N390 Urinary tract infection, site not specified: Secondary | ICD-10-CM | POA: Diagnosis not present

## 2023-10-28 DIAGNOSIS — M8580 Other specified disorders of bone density and structure, unspecified site: Secondary | ICD-10-CM | POA: Diagnosis not present

## 2023-10-28 DIAGNOSIS — J1008 Influenza due to other identified influenza virus with other specified pneumonia: Secondary | ICD-10-CM | POA: Diagnosis not present

## 2023-10-28 DIAGNOSIS — A419 Sepsis, unspecified organism: Secondary | ICD-10-CM | POA: Diagnosis not present

## 2023-10-28 DIAGNOSIS — I2489 Other forms of acute ischemic heart disease: Secondary | ICD-10-CM | POA: Diagnosis not present

## 2023-10-28 DIAGNOSIS — I480 Paroxysmal atrial fibrillation: Secondary | ICD-10-CM | POA: Diagnosis not present

## 2023-10-28 DIAGNOSIS — M62838 Other muscle spasm: Secondary | ICD-10-CM | POA: Diagnosis not present

## 2023-10-28 DIAGNOSIS — H34212 Partial retinal artery occlusion, left eye: Secondary | ICD-10-CM | POA: Diagnosis not present

## 2023-10-28 DIAGNOSIS — I712 Thoracic aortic aneurysm, without rupture, unspecified: Secondary | ICD-10-CM | POA: Diagnosis not present

## 2023-10-28 DIAGNOSIS — J439 Emphysema, unspecified: Secondary | ICD-10-CM | POA: Diagnosis not present

## 2023-10-28 DIAGNOSIS — D509 Iron deficiency anemia, unspecified: Secondary | ICD-10-CM | POA: Diagnosis not present

## 2023-10-28 DIAGNOSIS — I2721 Secondary pulmonary arterial hypertension: Secondary | ICD-10-CM | POA: Diagnosis not present

## 2023-10-28 DIAGNOSIS — J440 Chronic obstructive pulmonary disease with acute lower respiratory infection: Secondary | ICD-10-CM | POA: Diagnosis not present

## 2023-10-28 DIAGNOSIS — I428 Other cardiomyopathies: Secondary | ICD-10-CM | POA: Diagnosis not present

## 2023-10-28 DIAGNOSIS — F32A Depression, unspecified: Secondary | ICD-10-CM | POA: Diagnosis not present

## 2023-10-28 DIAGNOSIS — I110 Hypertensive heart disease with heart failure: Secondary | ICD-10-CM | POA: Diagnosis not present

## 2023-10-28 DIAGNOSIS — J159 Unspecified bacterial pneumonia: Secondary | ICD-10-CM | POA: Diagnosis not present

## 2023-10-28 DIAGNOSIS — J9621 Acute and chronic respiratory failure with hypoxia: Secondary | ICD-10-CM | POA: Diagnosis not present

## 2023-10-28 DIAGNOSIS — J441 Chronic obstructive pulmonary disease with (acute) exacerbation: Secondary | ICD-10-CM | POA: Diagnosis not present

## 2023-10-28 DIAGNOSIS — I5042 Chronic combined systolic (congestive) and diastolic (congestive) heart failure: Secondary | ICD-10-CM | POA: Diagnosis not present

## 2023-10-28 DIAGNOSIS — F419 Anxiety disorder, unspecified: Secondary | ICD-10-CM | POA: Diagnosis not present

## 2023-10-28 DIAGNOSIS — I447 Left bundle-branch block, unspecified: Secondary | ICD-10-CM | POA: Diagnosis not present

## 2023-10-29 DIAGNOSIS — N390 Urinary tract infection, site not specified: Secondary | ICD-10-CM | POA: Diagnosis not present

## 2023-10-29 DIAGNOSIS — A419 Sepsis, unspecified organism: Secondary | ICD-10-CM | POA: Diagnosis not present

## 2023-10-29 DIAGNOSIS — J440 Chronic obstructive pulmonary disease with acute lower respiratory infection: Secondary | ICD-10-CM | POA: Diagnosis not present

## 2023-10-29 DIAGNOSIS — I428 Other cardiomyopathies: Secondary | ICD-10-CM | POA: Diagnosis not present

## 2023-10-29 DIAGNOSIS — J1008 Influenza due to other identified influenza virus with other specified pneumonia: Secondary | ICD-10-CM | POA: Diagnosis not present

## 2023-10-29 DIAGNOSIS — J9621 Acute and chronic respiratory failure with hypoxia: Secondary | ICD-10-CM | POA: Diagnosis not present

## 2023-10-29 DIAGNOSIS — I110 Hypertensive heart disease with heart failure: Secondary | ICD-10-CM | POA: Diagnosis not present

## 2023-10-29 DIAGNOSIS — F419 Anxiety disorder, unspecified: Secondary | ICD-10-CM | POA: Diagnosis not present

## 2023-10-29 DIAGNOSIS — I5042 Chronic combined systolic (congestive) and diastolic (congestive) heart failure: Secondary | ICD-10-CM | POA: Diagnosis not present

## 2023-10-29 DIAGNOSIS — I480 Paroxysmal atrial fibrillation: Secondary | ICD-10-CM | POA: Diagnosis not present

## 2023-10-29 DIAGNOSIS — F32A Depression, unspecified: Secondary | ICD-10-CM | POA: Diagnosis not present

## 2023-10-29 DIAGNOSIS — I2510 Atherosclerotic heart disease of native coronary artery without angina pectoris: Secondary | ICD-10-CM | POA: Diagnosis not present

## 2023-10-29 DIAGNOSIS — E785 Hyperlipidemia, unspecified: Secondary | ICD-10-CM | POA: Diagnosis not present

## 2023-10-29 DIAGNOSIS — H34212 Partial retinal artery occlusion, left eye: Secondary | ICD-10-CM | POA: Diagnosis not present

## 2023-10-29 DIAGNOSIS — I712 Thoracic aortic aneurysm, without rupture, unspecified: Secondary | ICD-10-CM | POA: Diagnosis not present

## 2023-10-29 DIAGNOSIS — J441 Chronic obstructive pulmonary disease with (acute) exacerbation: Secondary | ICD-10-CM | POA: Diagnosis not present

## 2023-10-29 DIAGNOSIS — D509 Iron deficiency anemia, unspecified: Secondary | ICD-10-CM | POA: Diagnosis not present

## 2023-10-29 DIAGNOSIS — J439 Emphysema, unspecified: Secondary | ICD-10-CM | POA: Diagnosis not present

## 2023-10-29 DIAGNOSIS — I081 Rheumatic disorders of both mitral and tricuspid valves: Secondary | ICD-10-CM | POA: Diagnosis not present

## 2023-10-29 DIAGNOSIS — I2721 Secondary pulmonary arterial hypertension: Secondary | ICD-10-CM | POA: Diagnosis not present

## 2023-10-29 DIAGNOSIS — I447 Left bundle-branch block, unspecified: Secondary | ICD-10-CM | POA: Diagnosis not present

## 2023-10-29 DIAGNOSIS — I2489 Other forms of acute ischemic heart disease: Secondary | ICD-10-CM | POA: Diagnosis not present

## 2023-10-29 DIAGNOSIS — M8580 Other specified disorders of bone density and structure, unspecified site: Secondary | ICD-10-CM | POA: Diagnosis not present

## 2023-10-29 DIAGNOSIS — J159 Unspecified bacterial pneumonia: Secondary | ICD-10-CM | POA: Diagnosis not present

## 2023-10-29 DIAGNOSIS — M62838 Other muscle spasm: Secondary | ICD-10-CM | POA: Diagnosis not present

## 2023-11-01 ENCOUNTER — Other Ambulatory Visit: Payer: Self-pay | Admitting: Radiology

## 2023-11-01 ENCOUNTER — Encounter: Payer: Self-pay | Admitting: Oncology

## 2023-11-01 DIAGNOSIS — D539 Nutritional anemia, unspecified: Secondary | ICD-10-CM

## 2023-11-01 NOTE — H&P (Signed)
 Chief Complaint: Worsening macrocytic anemia of uncertain etiology; referred for CT-guided bone marrow biopsy for further evaluation  Referring Provider(s): Iruku,P  Supervising Physician: Erica Hau  Patient Status: [facility] - Out-pt  History of Present Illness: Dana Pruitt is an 83 y.o. female ex smoker with past medical history significant for anxiety, right breast cancer, coronary artery disease, CHF, COPD, depression, GERD, hyperlipidemia, hypertension, aortic insufficiency, nonischemic cardiomyopathy, paroxysmal atrial fibrillation on anticoagulation, prior AVR, prior stroke 2023 who presents now with persistent macrocytic anemia of uncertain etiology.  She is scheduled today for CT-guided bone marrow biopsy for further evaluation.  Patient is Full Code  Currently without any significant complaints.  Patient alert and laying in bed,calm.  Denies any fevers, headache, chest pain, SOB, cough, abdominal pain, nausea, vomiting or bleeding.     Past Medical History:  Diagnosis Date   Anemia    "several times over the years" (10/17/2012)   Anxiety    Breast cancer (HCC) 02/2010   right breast   CAD (coronary artery disease)    a. LHC 12/2018: mild non-obstructive CAD   Carcinoma of breast treated with adjuvant hormone therapy (HCC) 02/2010   Femara    Chronic systolic CHF    a. Echo in 2015: LVEF of 35-40%, b. Echo in 12/2021: LVEF of 60-65%   COPD (chronic obstructive pulmonary disease) (HCC)    Depression    GERD (gastroesophageal reflux disease)    diet controlled, no med   Hollenhorst plaque, left eye    apparently resolved at recent ophthalmology visit   Hyperlipidemia    Hypertension    IDC, RightStage II, 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   Moderate aortic insufficiency    Non-ischemic cardiomyopathy (HCC)    Paroxysmal atrial fibrillation (HCC)    Pneumonia    "that's why I'm here" (10/17/2012)   S/P AVR (aortic valve replacement) 04/26/2019   Stroke (HCC)     12/2021    Past Surgical History:  Procedure Laterality Date   AORTIC VALVE REPLACEMENT N/A 01/31/2019   Procedure: AORTIC VALVE REPLACEMENT (AVR) USING 21 MM INSPIRIS RESILIS AORTIC VALVE. SN: [id number];  Surgeon: Matt Song, Donata Fryer, MD;  Location: [facility] OR;  Service: Open Heart Surgery;  Laterality: N/A;   APPENDECTOMY  1974   BREAST BIOPSY Right 02/27/10   Needle core Biopsy; Invasive Mammary; ER/PR Positive, Her-2 Neu negative, Ki-67 22%   BREAST LUMPECTOMY WITH SENTINEL LYMPH NODE BIOPSY Right 07/03/11   Invasive Ductal Carcinoma;0/3 nodes negative,; ER,PR Positive, Her-2 Neg; Ki-67 22%   CESAREAN SECTION  1970; 1974   COLONOSCOPY     DILATION AND CURETTAGE OF UTERUS  1970's   RIGHT HEART CATH AND CORONARY ANGIOGRAPHY N/A 08/31/2023   Procedure: RIGHT HEART CATH AND CORONARY ANGIOGRAPHY;  Surgeon: Arnoldo Lapping, MD;  Location: [facility] INVASIVE CV LAB;  Service: Cardiovascular;  Laterality: N/A;   RIGHT/LEFT HEART CATH AND CORONARY ANGIOGRAPHY N/A 01/02/2019   Procedure: RIGHT/LEFT HEART CATH AND CORONARY ANGIOGRAPHY;  Surgeon: Darlis Eisenmenger, MD;  Location: [facility] INVASIVE CV LAB;  Service: Cardiovascular;  Laterality: N/A;   TEE WITHOUT CARDIOVERSION N/A 01/31/2019   Procedure: TRANSESOPHAGEAL ECHOCARDIOGRAM (TEE);  Surgeon: Matt Song, Donata Fryer, MD;  Location: [facility] OR;  Service: Open Heart Surgery;  Laterality: N/A;   TUBAL LIGATION  1974   WISDOM TOOTH EXTRACTION      Allergies: Patient has no known allergies.  Medications: Prior to Admission medications   Medication Sig Start Date End Date Taking? Authorizing Provider  albuterol  (PROVENTIL ) (2.5 MG/3ML) 0.083% nebulizer  solution Take 3 mLs (2.5 mg total) by nebulization every 6 (six) hours as needed for wheezing or shortness of breath. 08/16/23   Cobb, Mariah Shines, NP  albuterol  (VENTOLIN  HFA) 108 (90 Base) MCG/ACT inhaler Inhale 2 puffs into the lungs every 6 (six) hours as needed for wheezing or shortness of breath. INHALE 2 PUFFS BY MOUTH EVERY 4  HOURS AS NEEDED ONLY IF YOU CAN'T CATCH YOUR BREATH 10/28/23   Diamond Formica, MD  amiodarone  (PACERONE ) 200 MG tablet Take 1 tablet (200 mg total) by mouth daily. 09/02/23   Parcells, Cyrilla Drivers, PA-C  apixaban  (ELIQUIS ) 2.5 MG TABS tablet Take 1 tablet (2.5 mg total) by mouth 2 (two) times daily. 10/11/23   Avanell Leigh, MD  atorvastatin  (LIPITOR) 20 MG tablet Take 20 mg by mouth daily.  02/12/14   [provider]  bisoprolol  (ZEBETA ) 5 MG tablet Take 0.5 tablets (2.5 mg total) by mouth daily. 09/03/23   Parcells, Cyrilla Drivers, PA-C  BREZTRI  AEROSPHERE 160-9-4.8 MCG/ACT AERO INHALE 2 PUFFS INTO THE LUNGS TWICE DAILY 02/02/23   Wert, Michael B, MD  Cholecalciferol  (VITAMIN D3) 25 MCG (1000 UT) CAPS Take 1,000 Units by mouth daily. 02/10/23   [provider]  ezetimibe  (ZETIA ) 10 MG tablet Take 10 mg by mouth daily.    [provider]  FEROSUL 325 (65 Fe) MG tablet Take 325 mg by mouth every other day. 05/08/23   [provider]  fluticasone  (FLONASE ) 50 MCG/ACT nasal spray SHAKE LIQUID AND USE 2 SPRAYS IN EACH NOSTRIL DAILY 03/18/23   Wert, Michael B, MD  guaiFENesin  (MUCINEX ) 600 MG 12 hr tablet Take 1 tablet (600 mg total) by mouth 2 (two) times daily. 02/07/19   Gold, Wayne E, PA-C  guaiFENesin -dextromethorphan  (ROBITUSSIN DM) 100-10 MG/5ML syrup Take 5 mLs by mouth every 4 (four) hours as needed for cough. 09/16/23   Lesa Rape, MD  ipratropium (ATROVENT ) 0.06 % nasal spray Place 2 sprays into both nostrils 4 (four) times daily. 08/16/23   Cobb, Mariah Shines, NP  losartan  (COZAAR ) 25 MG tablet Take 25 mg by mouth daily.    [provider]  OXYGEN  Inhale 4 L/min into the lungs continuous.    [provider]  predniSONE  (DELTASONE ) 10 MG tablet 3 tabs daily for 3 days then 2 tabs daily for 3 days then 1 tab daily for 3 days 10/25/23   Wouk, Haynes Lips, MD  sertraline  (ZOLOFT ) 100 MG tablet Take 200 mg by mouth daily.    [provider]   valACYclovir (VALTREX) 1000 MG tablet Take 500 mg by mouth daily as needed (cold sores).    [provider]     Family History  Problem Relation Age of Onset   Heart disease Mother    Cancer Father    Heart disease Maternal Grandmother    Heart disease Maternal Grandfather    Cancer Paternal Grandmother    Lung disease Neg Hx     Social History   Socioeconomic History   Marital status: Married    Spouse name: Not on file   Number of children: 2   Years of education: college   Highest education level: Bachelor's degree (e.g., BA, AB, BS)  Occupational History    Employer: RETIRED  Tobacco Use   Smoking status: Former    Current packs/day: 0.00    Average packs/day: 3.0 packs/day for 32.1 years (96.3 ttl pk-yrs)    Types: Cigarettes    Start date: 05/19/1958  Quit date: 06/30/1990    Years since quitting: 33.3   Smokeless tobacco: Never   Tobacco comments:    Stopped smoking for 2-3 years during second child.   Vaping Use   Vaping status: Never Used  Substance and Sexual Activity   Alcohol use: Not Currently   Drug use: No   Sexual activity: Yes    Birth control/protection: Post-menopausal    Comment: Menarche age 57, G38,P2, Menopause in 57"s, No HRT Married  Other Topics Concern   Not on file  Social History Narrative   [facility] (12/29/16):   Originally from [place], [place]. Moved to [place] at 83 y.o. Does have a dog. Hasn't worked outside the home. Remote parakeet exposure from her children. No mold or hot tub exposure. Enjoys doing yard work & walking.    Social Drivers of Corporate investment banker Strain: Not on file  Food Insecurity: No Food Insecurity (10/21/2023)   Hunger Vital Sign    Worried About Running Out of Food in the Last Year: Never true    Ran Out of Food in the Last Year: Never true  Transportation Needs: No Transportation Needs (10/21/2023)   PRAPARE - Administrator, Civil Service (Medical): No    Lack of  Transportation (Non-Medical): No  Physical Activity: Not on file  Stress: Not on file  Social Connections: Moderately Integrated (10/21/2023)   Social Connection and Isolation Panel [NHANES]    Frequency of Communication with Friends and Family: More than three times a week    Frequency of Social Gatherings with Friends and Family: More than three times a week    Attends Religious Services: Never    Database administrator or Organizations: Yes    Attends Engineer, structural: More than 4 times per year    Marital Status: Married     Vital Signs: LMP  (LMP Unknown)   Advance Care Plan: No documents on file  Physical Exam Constitutional:      General: She is not in acute distress.    Appearance: Normal appearance.  HENT:     Mouth/Throat:     Mouth: Mucous membranes are moist.  Cardiovascular:     Rate and Rhythm: Normal rate and regular rhythm.     Heart sounds: No murmur heard. Pulmonary:     Effort: Pulmonary effort is normal.     Breath sounds: Normal breath sounds. No wheezing.     Comments: On 4 L O2 by nasal canula, at baseline. Musculoskeletal:        General: Normal range of motion.  Skin:    General: Skin is warm and dry.  Neurological:     Mental Status: She is alert and oriented to person, place, and time.  Psychiatric:        Mood and Affect: Mood normal.        Behavior: Behavior normal.        Thought Content: Thought content normal.        Judgment: Judgment normal.     Imaging: ECHOCARDIOGRAM LIMITED Result Date: 10/25/2023    ECHOCARDIOGRAM LIMITED REPORT   Patient Name:   Dana Pruitt Date of Exam: 10/25/2023 Medical Rec #:  [id number]       Height:       64.0 in Accession #:    [id number]      Weight:       120.1 lb Date of Birth:  January 24, 1941      BSA:  1.575 m Patient Age:    82 years        BP:           152/89 mmHg Patient Gender: F               HR:           69 bpm. Exam Location:  Inpatient Procedure: Limited Echo and Limited  Color Doppler (Both Spectral and Color Flow            Doppler were utilized during procedure). Indications:    CHF-Acute Systolic I50.21  History:        Patient has prior history of Echocardiogram examinations, most                 recent 09/05/2023. CHF and Cardiomyopathy, Pulmonary HTN and                 COPD, Arrythmias:Atrial Fibrillation, Signs/Symptoms:Chest Pain;                 Risk Factors:Hypertension and Dyslipidemia.                 Aortic Valve: 21 mm a bioprosthetic valve is present in the                 aortic position. Procedure Date: 04/26/19.  Sonographer:    Terrilee Few RCS Referring Phys: [id number] RAMESH KC IMPRESSIONS  1. Left ventricular ejection fraction, by estimation, is 35 to 40%. The left ventricle has moderately decreased function. The left ventricle demonstrates global hypokinesis.  2. The mitral valve is normal in structure. Mild mitral valve regurgitation.  3. Tricuspid valve regurgitation is mild to moderate.  4. The aortic valve has been repaired/replaced. Aortic valve regurgitation is not visualized. Aortic valve sclerosis is present, with no evidence of aortic valve stenosis. There is a 21 mm a bioprosthetic valve present in the aortic position. Procedure Date: 04/26/19. Aortic valve mean gradient measures 10.0 mmHg.  5. Aortic dilatation noted. There is mild dilatation of the ascending aorta, measuring 44 mm.  6. There is normal pulmonary artery systolic pressure.  7. The inferior vena cava is normal in size with greater than 50% respiratory variability, suggesting right atrial pressure of 3 mmHg. Comparison(s): Prior images reviewed side by side. Visually appears unchanged from prior. FINDINGS  Left Ventricle: Left ventricular ejection fraction, by estimation, is 35 to 40%. The left ventricle has moderately decreased function. The left ventricle demonstrates global hypokinesis. Definity  contrast agent was given IV to delineate the left ventricular endocardial borders. The  left ventricular internal cavity size was normal in size. Abnormal (paradoxical) septal motion consistent with post-operative status. Right Ventricle: There is normal pulmonary artery systolic pressure. The tricuspid regurgitant velocity is 2.61 m/s, and with an assumed right atrial pressure of 3 mmHg, the estimated right ventricular systolic pressure is 30.2 mmHg. Mitral Valve: The mitral valve is normal in structure. Mild mitral valve regurgitation. Tricuspid Valve: The tricuspid valve is normal in structure. Tricuspid valve regurgitation is mild to moderate. No evidence of tricuspid stenosis. Aortic Valve: The aortic valve has been repaired/replaced. Aortic valve regurgitation is not visualized. Aortic valve sclerosis is present, with no evidence of aortic valve stenosis. Aortic valve mean gradient measures 10.0 mmHg. Aortic valve peak gradient measures 17.7 mmHg. Aortic valve area, by VTI measures 0.95 cm. There is a 21 mm a bioprosthetic valve present in the aortic position. Procedure Date: 04/26/19. Pulmonic Valve: The pulmonic valve was grossly normal. Pulmonic  valve regurgitation is trivial. No evidence of pulmonic stenosis. Aorta: Aortic dilatation noted. There is mild dilatation of the ascending aorta, measuring 44 mm. Venous: The inferior vena cava is normal in size with greater than 50% respiratory variability, suggesting right atrial pressure of 3 mmHg. LEFT VENTRICLE PLAX 2D LVIDd:         4.24 cm      Diastology LVIDs:         3.40 cm      LV e' medial:    5.35 cm/s LV PW:         1.00 cm      LV E/e' medial:  13.0 LV IVS:        1.00 cm      LV e' lateral:   7.61 cm/s LVOT diam:     1.80 cm      LV E/e' lateral: 9.1 LV SV:         39 LV SV Index:   25 LVOT Area:     2.54 cm  LV Volumes (MOD) LV vol d, MOD A4C: 113.0 ml LV SV MOD A4C:     113.0 ml RIGHT VENTRICLE             IVC RV S prime:     10.20 cm/s  IVC diam: 1.80 cm TAPSE (M-mode): 1.6 cm LEFT ATRIUM         Index LA diam:    3.90 cm 2.48  cm/m  AORTIC VALVE AV Area (Vmax):    0.99 cm AV Area (Vmean):   0.90 cm AV Area (VTI):     0.95 cm AV Vmax:           210.50 cm/s AV Vmean:          142.500 cm/s AV VTI:            0.414 m AV Peak Grad:      17.7 mmHg AV Mean Grad:      10.0 mmHg LVOT Vmax:         82.10 cm/s LVOT Vmean:        50.400 cm/s LVOT VTI:          0.155 m LVOT/AV VTI ratio: 0.37  AORTA Ao Root diam: 2.50 cm Ao Asc diam:  4.40 cm MITRAL VALVE               TRICUSPID VALVE MV Area (PHT): 4.21 cm    TR Peak grad:   27.2 mmHg MV Decel Time: 180 msec    TR Vmax:        261.00 cm/s MV E velocity: 69.50 cm/s MV A velocity: 59.20 cm/s  SHUNTS MV E/A ratio:  1.17        Systemic VTI:  0.16 m                            Systemic Diam: 1.80 cm Sheryle Donning MD Electronically signed by Sheryle Donning MD Signature Date/Time: 10/25/2023/5:55:23 PM    Final    DG Chest Port 1 View Result Date: 10/24/2023 CLINICAL DATA:  83 year old female with hypoxia, CHF. EXAM: PORTABLE CHEST 1 VIEW COMPARISON:  Portable chest 10/23/2023 and earlier. FINDINGS: Portable AP semi upright view at 0633 hours. Mildly rotated to the left now. Stable cardiac size and mediastinal contours. Previous sternotomy and cardiac valve replacement. Calcified aortic atherosclerosis. Visualized tracheal air column is within normal limits. Lower lung predominant increased interstitial markings with  emphysema on CT this month. No superimposed pneumothorax, pulmonary edema. Streaky retrocardiac opacity appears stable and related to atelectasis or scarring on the recent CT. No definite progressed lung opacity when allowing for rotation, and also when compared to chest radiograph on 10/21/2023. No evidence of pleural effusion. Stable visualized osseous structures. Paucity of bowel gas. IMPRESSION: Mildly rotated portable chest with emphysema, stable left lung base atelectasis/scarring. No acute cardiopulmonary abnormality. Electronically Signed   By: Marlise Simpers M.D.   On:  10/24/2023 08:42   DG Chest Port 1 View Result Date: 10/23/2023 CLINICAL DATA:  Hypoxia. EXAM: PORTABLE CHEST 1 VIEW COMPARISON:  10/21/2023 FINDINGS: Lungs are hyperexpanded. Patchy airspace disease is seen in the right mid lung in both lung bases, suspicious for multifocal pneumonia. Interstitial markings are diffusely coarsened with chronic features. Cardiopericardial silhouette is at upper limits of normal for size. No acute bony abnormality. Telemetry leads overlie the chest. IMPRESSION: Patchy airspace disease in the right mid lung and both lung bases, suspicious for multifocal pneumonia. Electronically Signed   By: Donnal Fusi M.D.   On: 10/23/2023 09:24   CT Angio Chest Pulmonary Embolism (PE) W or WO Contrast Result Date: 10/21/2023 CLINICAL DATA:  Pulmonary embolism (PE) suspected, high prob EXAM: CT ANGIOGRAPHY CHEST WITH CONTRAST TECHNIQUE: Multidetector CT imaging of the chest was performed using the standard protocol during bolus administration of intravenous contrast. Multiplanar CT image reconstructions and MIPs were obtained to evaluate the vascular anatomy. RADIATION DOSE REDUCTION: This exam was performed according to the departmental dose-optimization program which includes automated exposure control, adjustment of the mA and/or kV according to patient size and/or use of iterative reconstruction technique. CONTRAST:  60mL OMNIPAQUE  IOHEXOL  350 MG/ML SOLN COMPARISON:  CTA chest dated 09/13/2023. FINDINGS: Cardiovascular: Satisfactory opacification of the pulmonary arteries to the segmental level. No evidence of pulmonary embolism. Stable mild cardiomegaly. Multivessel coronary artery calcifications. Prior aortic valve replacement. Unchanged aneurysmal dilatation of the ascending thoracic aorta, measuring 4.5 cm. Atherosclerotic calcification of the thoracic aorta and arch branch vessels. Unchanged dilation of the pulmonary trunk measuring 4.1 cm in diameter. Mediastinum/Nodes: No enlarged  mediastinal, hilar, or axillary lymph nodes. Thyroid  gland, trachea, and esophagus demonstrate no significant findings. Lungs/Pleura: Paraseptal and centrilobular emphysema. Biapical pleuroparenchymal scarring. Bilateral bronchiectatic changes with probable mild mucous plugging at the left lung base. The previously noted consolidation in the lingula has nearly resolved. Similar fibrotic changes along the anterior aspect of the right upper lobe. Stable size of a few scattered sub 4 mm pulmonary nodules. No new suspicious or enlarging pulmonary nodule. No pleural effusion or pneumothorax. Upper Abdomen: No acute abnormality. Musculoskeletal: Prior median sternotomy. No acute osseous abnormality. No suspicious osseous lesion. Review of the MIP images confirms the above findings. IMPRESSION: 1. No evidence of pulmonary embolism. 2. Bilateral bronchiectatic changes with mild mucous plugging at the left lung base. The previously noted consolidation in the lingula has nearly resolved. 3. Aortic atherosclerosis with aneurysmal dilation of the ascending thoracic aorta measuring 4.5 cm. Recommend semi-annual imaging followup by CTA or MRA and referral to cardiothoracic surgery if not already obtained. This recommendation follows 2010 ACCF/AHA/AATS/ACR/ASA/SCA/SCAI/SIR/STS/SVM Guidelines for the Diagnosis and Management of Patients With Thoracic Aortic Disease. Circulation. 2010; 121: G867-Y19. Aortic aneurysm NOS (ICD10-I71.9) 4. Similar dilation of the pulmonary trunk, which can be seen in the setting of pulmonary arterial hypertension. 5. Aortic Atherosclerosis (ICD10-I70.0) and Emphysema (ICD10-J43.9). Electronically Signed   By: Mannie Seek M.D.   On: 10/21/2023 18:01   DG Chest 2  View Result Date: 10/21/2023 CLINICAL DATA:  Shortness of breath. EXAM: CHEST - 2 VIEW COMPARISON:  09/13/2023. FINDINGS: Stable cardiomegaly. Prior median sternotomy and valve prosthesis. Emphysema. Streaky retrocardiac opacities, could  reflect atelectasis or infiltrate. The right lung appears clear. No pleural effusion or pneumothorax. No acute osseous abnormality. IMPRESSION: 1. Streaky retrocardiac opacities could reflect atelectasis or infiltrate. 2. Emphysema. Electronically Signed   By: Mannie Seek M.D.   On: 10/21/2023 15:02    Labs:  CBC: Recent Labs    10/21/23 1323 10/22/23 0746 10/24/23 0532 10/25/23 0423  WBC 7.1 5.3 5.9 6.4  HGB 8.8* 8.3* 8.0* 8.5*  HCT 28.0* 25.3* 25.2* 26.4*  PLT 284 282 264 287    COAGS: Recent Labs    08/31/23 1315 09/13/23 1534  INR  --  1.4*  APTT 49* 32    BMP: Recent Labs    10/21/23 1323 10/22/23 0746 10/24/23 0532 10/25/23 0423  NA 136 138 140 139  K 3.8 3.8 3.2* 3.7  CL 99 100 104 104  CO2 25 27 26 26   GLUCOSE 141* 145* 105* 112*  BUN 17 18 27* 33*  CALCIUM  9.7 9.3 9.1 8.9  CREATININE 1.27* 1.25* 1.21* 1.14*  GFRNONAA 42* 43* 45* 48*    LIVER FUNCTION TESTS: Recent Labs    09/09/23 1659 09/13/23 1534 10/21/23 1323 10/22/23 0746  BILITOT 0.2 1.1 0.8 0.7  AST 30 25 22 17   ALT 30 28 14 13   ALKPHOS 43* 43 46 42  PROT 5.8* 6.5 6.0* 5.9*  ALBUMIN  4.2 3.2* 3.2* 3.1*    TUMOR MARKERS: No results for input(s): "AFPTM", "CEA", "CA199", "CHROMGRNA" in the last 8760 hours.  Assessment and Plan: 83 y.o. female ex smoker with past medical history significant for anxiety, right breast cancer, coronary artery disease, CHF, COPD, depression, GERD, hyperlipidemia, hypertension, aortic insufficiency, nonischemic cardiomyopathy, paroxysmal atrial fibrillation on anticoagulation, prior AVR, prior stroke 2023 who presents now with persistent macrocytic anemia of uncertain etiology.    She is scheduled today for CT-guided bone marrow biopsy for further evaluation.  All labs and medications are within acceptable parameters. No pertinent allergies. Patient has been NPO since midnight. She remains on Eliquis .  Risks and benefits of procedure was discussed with  the patient  including, but not limited to bleeding, infection, damage to adjacent structures or low yield requiring additional tests.  All of the questions were answered and there is agreement to proceed.  Consent signed and in chart.    Thank you for allowing our service to participate in LATANA ANTE 's care.  Electronically Signed: D. Honore Lux, PA-C   11/01/2023, 4:29 PM      I spent a total of  15 Minutes   in face to face in clinical consultation, greater than 50% of which was counseling/coordinating care for CT-guided bone marrow biopsy

## 2023-11-02 ENCOUNTER — Ambulatory Visit (HOSPITAL_COMMUNITY)
Admission: RE | Admit: 2023-11-02 | Discharge: 2023-11-02 | Disposition: A | Discharge status: Home / Self Care | Source: Ambulatory Visit | Attending: Hematology and Oncology | Admitting: Hematology and Oncology

## 2023-11-02 ENCOUNTER — Encounter (HOSPITAL_COMMUNITY): Payer: Self-pay

## 2023-11-02 ENCOUNTER — Other Ambulatory Visit: Payer: Self-pay

## 2023-11-02 DIAGNOSIS — J449 Chronic obstructive pulmonary disease, unspecified: Secondary | ICD-10-CM | POA: Diagnosis not present

## 2023-11-02 DIAGNOSIS — F419 Anxiety disorder, unspecified: Secondary | ICD-10-CM | POA: Diagnosis not present

## 2023-11-02 DIAGNOSIS — Z8673 Personal history of transient ischemic attack (TIA), and cerebral infarction without residual deficits: Secondary | ICD-10-CM | POA: Diagnosis not present

## 2023-11-02 DIAGNOSIS — E785 Hyperlipidemia, unspecified: Secondary | ICD-10-CM | POA: Insufficient documentation

## 2023-11-02 DIAGNOSIS — Z952 Presence of prosthetic heart valve: Secondary | ICD-10-CM | POA: Diagnosis not present

## 2023-11-02 DIAGNOSIS — I110 Hypertensive heart disease with heart failure: Secondary | ICD-10-CM | POA: Diagnosis not present

## 2023-11-02 DIAGNOSIS — I480 Paroxysmal atrial fibrillation: Secondary | ICD-10-CM | POA: Insufficient documentation

## 2023-11-02 DIAGNOSIS — Z853 Personal history of malignant neoplasm of breast: Secondary | ICD-10-CM | POA: Insufficient documentation

## 2023-11-02 DIAGNOSIS — Z1379 Encounter for other screening for genetic and chromosomal anomalies: Secondary | ICD-10-CM | POA: Insufficient documentation

## 2023-11-02 DIAGNOSIS — I428 Other cardiomyopathies: Secondary | ICD-10-CM | POA: Diagnosis not present

## 2023-11-02 DIAGNOSIS — D539 Nutritional anemia, unspecified: Secondary | ICD-10-CM | POA: Diagnosis not present

## 2023-11-02 DIAGNOSIS — F32A Depression, unspecified: Secondary | ICD-10-CM | POA: Diagnosis not present

## 2023-11-02 DIAGNOSIS — I509 Heart failure, unspecified: Secondary | ICD-10-CM | POA: Diagnosis not present

## 2023-11-02 DIAGNOSIS — I2510 Atherosclerotic heart disease of native coronary artery without angina pectoris: Secondary | ICD-10-CM | POA: Diagnosis not present

## 2023-11-02 DIAGNOSIS — Z7901 Long term (current) use of anticoagulants: Secondary | ICD-10-CM | POA: Insufficient documentation

## 2023-11-02 DIAGNOSIS — K219 Gastro-esophageal reflux disease without esophagitis: Secondary | ICD-10-CM | POA: Insufficient documentation

## 2023-11-02 DIAGNOSIS — D720 Genetic anomalies of leukocytes: Secondary | ICD-10-CM | POA: Diagnosis not present

## 2023-11-02 LAB — CBC WITH DIFFERENTIAL/PLATELET
Abs Immature Granulocytes: 0.31 10*3/uL — ABNORMAL HIGH (ref 0.00–0.07)
Basophils Absolute: 0 10*3/uL (ref 0.0–0.1)
Basophils Relative: 0 %
Eosinophils Absolute: 0.3 10*3/uL (ref 0.0–0.5)
Eosinophils Relative: 4 %
HCT: 27.8 % — ABNORMAL LOW (ref 36.0–46.0)
Hemoglobin: 8.9 g/dL — ABNORMAL LOW (ref 12.0–15.0)
Immature Granulocytes: 4 %
Lymphocytes Relative: 13 %
Lymphs Abs: 1.2 10*3/uL (ref 0.7–4.0)
MCH: 33 pg (ref 26.0–34.0)
MCHC: 32 g/dL (ref 30.0–36.0)
MCV: 103 fL — ABNORMAL HIGH (ref 80.0–100.0)
Monocytes Absolute: 0.7 10*3/uL (ref 0.1–1.0)
Monocytes Relative: 8 %
Neutro Abs: 6.2 10*3/uL (ref 1.7–7.7)
Neutrophils Relative %: 71 %
Platelets: 167 10*3/uL (ref 150–400)
RBC: 2.7 MIL/uL — ABNORMAL LOW (ref 3.87–5.11)
RDW: 15.4 % (ref 11.5–15.5)
WBC: 8.7 10*3/uL (ref 4.0–10.5)
nRBC: 0 % (ref 0.0–0.2)

## 2023-11-02 MED ORDER — FENTANYL CITRATE (PF) 100 MCG/2ML IJ SOLN
INTRAMUSCULAR | Status: AC
  Filled 2023-11-02: qty 2

## 2023-11-02 MED ORDER — SODIUM CHLORIDE 0.9 % IV SOLN: INTRAVENOUS | Status: DC

## 2023-11-02 MED ORDER — MIDAZOLAM HCL 2 MG/2ML IJ SOLN
INTRAMUSCULAR | Status: AC
  Filled 2023-11-02: qty 2

## 2023-11-02 MED ORDER — MIDAZOLAM HCL 2 MG/2ML IJ SOLN
INTRAMUSCULAR | Status: AC | PRN
Start: 2023-11-02 — End: 2023-11-02
  Administered 2023-11-02: 1 mg via INTRAVENOUS

## 2023-11-02 MED ORDER — FENTANYL CITRATE (PF) 100 MCG/2ML IJ SOLN
INTRAMUSCULAR | Status: AC | PRN
  Administered 2023-11-02: 50 ug via INTRAVENOUS

## 2023-11-02 NOTE — Procedures (Signed)
 Interventional Radiology Procedure Note  Procedure: CT guided bone marrow aspiration and biopsy  Complications: None  EBL: < 10 mL  Findings: Aspirate and core biopsy performed of bone marrow in right iliac bone.  Plan: Bedrest supine x 1 hrs  Ericha Whittingham T. Fredia Sorrow, M.D Pager:  [phone]

## 2023-11-02 NOTE — Discharge Instructions (Signed)
 Bone Marrow Aspiration and Bone Marrow Biopsy, Adult, Care After  The following information offers guidance on how to care for yourself after your procedure. Your health care provider may also give you more specific instructions. If you have problems or questions, contact your health care provider.  What can I expect after the procedure?  May remove dressing or bandaid and shower tomorrow.  Keep site clean and dry. Replace with clean dressing or bandaid as necessary. Urgent needs IR clinic [phone] (mon-fri 8-5).  After the procedure, it is common to have: Mild pain and tenderness. Swelling. Bruising. Follow these instructions at home: Incision care  Follow instructions from your health care provider about how to take care of the incision site. Make sure you: Wash your hands with soap and water for at least 20 seconds before and after you change your bandage (dressing). If soap and water are not available, use hand sanitizer. Change your dressing as told by your health care provider. Leave stitches (sutures), skin glue, or adhesive strips in place. These skin closures may need to stay in place for 2 weeks or longer. If adhesive strip edges start to loosen and curl up, you may trim the loose edges. Do not remove adhesive strips completely unless your health care provider tells you to do that. Check your incision site every day for signs of infection. Check for: More redness, swelling, or pain. Fluid or blood. Warmth. Pus or a bad smell. Activity Return to your normal activities as told by your health care provider. Ask your health care provider what activities are safe for you. Do not lift anything that is heavier than 10 lb (4.5 kg), or the limit that you are told, until your health care provider says that it is safe. If you were given a sedative during the procedure, it can affect you for several hours. Do not drive or operate machinery until your health care provider says that it is  safe. General instructions  Take over-the-counter and prescription medicines only as told by your health care provider. Do not take baths, swim, or use a hot tub until your health care provider approves. Ask your health care provider if you may take showers. You may only be allowed to take sponge baths. If directed, put ice on the affected area. To do this: Put ice in a plastic bag. Place a towel between your skin and the bag. Leave the ice on for 20 minutes, 2-3 times a day. If your skin turns bright red, remove the ice right away to prevent skin damage. The risk of skin damage is higher if you cannot feel pain, heat, or cold. Contact a health care provider if: You have signs of infection. Your pain is not controlled with medicine. You have cancer, and a temperature of 100.22F (38C) or higher. Get help right away if: You have a temperature of 101F (38.3C) or higher, or as told by your health care provider. You have bleeding from the incision site that cannot be controlled. This information is not intended to replace advice given to you by your health care provider. Make sure you discuss any questions you have with your health care provider. Document Revised: 10/13/2021 Document Reviewed: 10/13/2021 Elsevier Patient Education  2023 [employer]     Moderate Conscious Sedation, Adult, Care After  This sheet gives you information about how to care for yourself after your procedure. Your health care provider may also give you more specific instructions. If you have problems or questions, contact  your health care provider. What can I expect after the procedure? After the procedure, it is common to have: Sleepiness for several hours. Impaired judgment for several hours. Difficulty with balance. Vomiting if you eat too soon. Follow these instructions at home: For the time period you were told by your health care provider:     Rest. Do not participate in activities where you  could fall or become injured. Do not drive or use machinery. Do not drink alcohol. Do not take sleeping pills or medicines that cause drowsiness. Do not make important decisions or sign legal documents. Do not take care of children on your own. Eating and drinking  Follow the diet recommended by your health care provider. Drink enough fluid to keep your urine pale yellow. If you vomit: Drink water, juice, or soup when you can drink without vomiting. Make sure you have little or no nausea before eating solid foods. General instructions Take over-the-counter and prescription medicines only as told by your health care provider. Have a responsible adult stay with you for the time you are told. It is important to have someone help care for you until you are awake and alert. Do not smoke. Keep all follow-up visits as told by your health care provider. This is important. Contact a health care provider if: You are still sleepy or having trouble with balance after 24 hours. You feel light-headed. You keep feeling nauseous or you keep vomiting. You develop a rash. You have a fever. You have redness or swelling around the IV site. Get help right away if: You have trouble breathing. You have new-onset confusion at home. Summary After the procedure, it is common to feel sleepy, have impaired judgment, or feel nauseous if you eat too soon. Rest after you get home. Know the things you should not do after the procedure. Follow the diet recommended by your health care provider and drink enough fluid to keep your urine pale yellow. Get help right away if you have trouble breathing or new-onset confusion at home. This information is not intended to replace advice given to you by your health care provider. Make sure you discuss any questions you have with your health care provider. Document Revised: 10/06/2019 Document Reviewed: 05/04/2019 Elsevier Patient Education  2023 [employer].

## 2023-11-03 DIAGNOSIS — J449 Chronic obstructive pulmonary disease, unspecified: Secondary | ICD-10-CM | POA: Diagnosis not present

## 2023-11-03 LAB — SURGICAL PATHOLOGY

## 2023-11-04 DIAGNOSIS — H34212 Partial retinal artery occlusion, left eye: Secondary | ICD-10-CM | POA: Diagnosis not present

## 2023-11-04 DIAGNOSIS — I081 Rheumatic disorders of both mitral and tricuspid valves: Secondary | ICD-10-CM | POA: Diagnosis not present

## 2023-11-04 DIAGNOSIS — I428 Other cardiomyopathies: Secondary | ICD-10-CM | POA: Diagnosis not present

## 2023-11-04 DIAGNOSIS — D509 Iron deficiency anemia, unspecified: Secondary | ICD-10-CM | POA: Diagnosis not present

## 2023-11-04 DIAGNOSIS — J159 Unspecified bacterial pneumonia: Secondary | ICD-10-CM | POA: Diagnosis not present

## 2023-11-04 DIAGNOSIS — M8580 Other specified disorders of bone density and structure, unspecified site: Secondary | ICD-10-CM | POA: Diagnosis not present

## 2023-11-04 DIAGNOSIS — M62838 Other muscle spasm: Secondary | ICD-10-CM | POA: Diagnosis not present

## 2023-11-04 DIAGNOSIS — I447 Left bundle-branch block, unspecified: Secondary | ICD-10-CM | POA: Diagnosis not present

## 2023-11-04 DIAGNOSIS — J441 Chronic obstructive pulmonary disease with (acute) exacerbation: Secondary | ICD-10-CM | POA: Diagnosis not present

## 2023-11-04 DIAGNOSIS — I480 Paroxysmal atrial fibrillation: Secondary | ICD-10-CM | POA: Diagnosis not present

## 2023-11-04 DIAGNOSIS — A419 Sepsis, unspecified organism: Secondary | ICD-10-CM | POA: Diagnosis not present

## 2023-11-04 DIAGNOSIS — I2721 Secondary pulmonary arterial hypertension: Secondary | ICD-10-CM | POA: Diagnosis not present

## 2023-11-04 DIAGNOSIS — I5042 Chronic combined systolic (congestive) and diastolic (congestive) heart failure: Secondary | ICD-10-CM | POA: Diagnosis not present

## 2023-11-04 DIAGNOSIS — F419 Anxiety disorder, unspecified: Secondary | ICD-10-CM | POA: Diagnosis not present

## 2023-11-04 DIAGNOSIS — J439 Emphysema, unspecified: Secondary | ICD-10-CM | POA: Diagnosis not present

## 2023-11-04 DIAGNOSIS — J440 Chronic obstructive pulmonary disease with acute lower respiratory infection: Secondary | ICD-10-CM | POA: Diagnosis not present

## 2023-11-04 DIAGNOSIS — N390 Urinary tract infection, site not specified: Secondary | ICD-10-CM | POA: Diagnosis not present

## 2023-11-04 DIAGNOSIS — F32A Depression, unspecified: Secondary | ICD-10-CM | POA: Diagnosis not present

## 2023-11-04 DIAGNOSIS — J9621 Acute and chronic respiratory failure with hypoxia: Secondary | ICD-10-CM | POA: Diagnosis not present

## 2023-11-04 DIAGNOSIS — I712 Thoracic aortic aneurysm, without rupture, unspecified: Secondary | ICD-10-CM | POA: Diagnosis not present

## 2023-11-04 DIAGNOSIS — J1008 Influenza due to other identified influenza virus with other specified pneumonia: Secondary | ICD-10-CM | POA: Diagnosis not present

## 2023-11-04 DIAGNOSIS — I110 Hypertensive heart disease with heart failure: Secondary | ICD-10-CM | POA: Diagnosis not present

## 2023-11-04 DIAGNOSIS — I2489 Other forms of acute ischemic heart disease: Secondary | ICD-10-CM | POA: Diagnosis not present

## 2023-11-04 DIAGNOSIS — E785 Hyperlipidemia, unspecified: Secondary | ICD-10-CM | POA: Diagnosis not present

## 2023-11-04 DIAGNOSIS — I2510 Atherosclerotic heart disease of native coronary artery without angina pectoris: Secondary | ICD-10-CM | POA: Diagnosis not present

## 2023-11-05 ENCOUNTER — Encounter (HOSPITAL_BASED_OUTPATIENT_CLINIC_OR_DEPARTMENT_OTHER): Payer: Self-pay | Admitting: Pulmonary Disease

## 2023-11-05 ENCOUNTER — Ambulatory Visit (HOSPITAL_BASED_OUTPATIENT_CLINIC_OR_DEPARTMENT_OTHER): Payer: Medicare Other | Admitting: Pulmonary Disease

## 2023-11-05 VITALS — BP 90/57 | HR 61 | Ht 64.0 in | Wt 120.7 lb

## 2023-11-05 DIAGNOSIS — J4489 Other specified chronic obstructive pulmonary disease: Secondary | ICD-10-CM | POA: Diagnosis not present

## 2023-11-05 DIAGNOSIS — J9611 Chronic respiratory failure with hypoxia: Secondary | ICD-10-CM

## 2023-11-05 DIAGNOSIS — Z87891 Personal history of nicotine dependence: Secondary | ICD-10-CM | POA: Diagnosis not present

## 2023-11-05 DIAGNOSIS — J439 Emphysema, unspecified: Secondary | ICD-10-CM

## 2023-11-05 NOTE — Patient Instructions (Addendum)
 X Ambulatory sat  X Repeat PFTs   Continue Breztri  & oxygen    X check your BP tomorrow & dont take losartan  unless above 100

## 2023-11-05 NOTE — Progress Notes (Signed)
 Subjective:    Patient ID: Dana Pruitt, female    DOB: June 02, 1941, 83 y.o.   MRN: [id number]  HPI  83 year old female, former smoker followed for COPD and chronic respiratory failure. She is a patient of Dr. Jacqui Mau and also followed by Cleo Dace NP  On O2 since 06/2023   PMH : CHF, HTN, NICM, PH, PAF on low dose Eliquis , history of AVR, thoracic aortic aneurysm, HLD, depression, history of breast cancer   06/2023 OV Maintained on Breztri . SOB with exertion and low SpO2 since heart surgery 01/2019. Walk test with desaturation to 85% on room air - titrated up to 4 lpm on POC and was able to maintain >90%.   10/05/23 phone call >>She stated that she produced mucus this morning that was mixed with specs of dark brown blood. She described  coffee grounds   Presents for second opinion  She has been on continuous oxygen  therapy at four liters per minute since January due to chronic respiratory failure. Her respiratory status has declined from mild dyspnea to requiring continuous oxygen . She was hospitalized in March for influenza A, leading to a COPD exacerbation, and again on May 1st for another exacerbation. CT angiogram during these hospitalizations showed no pulmonary edema, pulmonary embolism, or infiltrate. Her oxygen  requirement remains at four liters.  She has paroxysmal atrial fibrillation and is on a low dose of Eliquis . In March, she was placed on amiodarone  following a right and left heart catheterization, which converted her to sinus rhythm. Her echocardiogram revealed a drop in ejection fraction to 30-35%.  Her most recent spirometry showed an FEV1 of 60%. She uses Breztri , taking two puffs twice a day, and albuterol  via nebulizer three times a day. She recently discontinued prednisone , which improved her condition while on it. She quit smoking 35 years ago but notes a significant decline in lung function since then.  She is concerned about her current oxygen  dependency and its impact on  her daily life, especially as she cares for her unwell husband. She feels better after hospitalizations but notes a decline in her condition once home and off prednisone .  O2 sat on RA was 80%, improved to 94 on 2L   Significant tests/ events reviewed  01/05/2019 PFT: FVC 79, FEV1 60, ratio 62, TLC 86, DLCO 39   CTA chest 10/21/2023 >>  Paraseptal and centrilobular emphysema. Bilateral bronchiectatic changes with probable mild mucous plugging at the left lung base. The previously noted consolidation in the lingula has nearly resolved. Similar fibrotic changes along the anterior aspect of the right upper lobe.  11/04/2022 CT chest without contrast: Atherosclerosis.  Aortic valve replacement.  Enlarged pulmonic trunk.  Centrilobular emphysema.  Subpleural radiation scarring in the right lung.  Tiny nodules in the right upper lobe, 3 mm or less, unchanged.  4.3 cm ascending aortic aneurysm, stable  LHC/RHC 08/2023 >> patent cors, right atrial mean pressure of 4 mmHg, PA pressure of 33/14 mean 21 mmHg, pulmonary wedge pressure of 5 mmHg, and preserved cardiac output of 3.7 L/min   Review of Systems neg for any significant sore throat, dysphagia, itching, sneezing, nasal congestion or excess/ purulent secretions, fever, chills, sweats, unintended wt loss, pleuritic or exertional cp, hempoptysis, orthopnea pnd or change in chronic leg swelling. Also denies presyncope, palpitations, heartburn, abdominal pain, nausea, vomiting, diarrhea or change in bowel or urinary habits, dysuria,hematuria, rash, arthralgias, visual complaints, headache, numbness weakness or ataxia.     Objective:   Physical Exam  Gen.  Pleasant, well-nourished, in no distress, anxious affect , on O2 ENT - no pallor,icterus, no post nasal drip Neck: No JVD, no thyromegaly, no carotid bruits Lungs: no use of accessory muscles, no dullness to percussion, clear without rales or rhonchi  Cardiovascular: Rhythm regular, heart sounds   normal, no murmurs or gallops, no peripheral edema Abdomen: soft and non-tender, no hepatosplenomegaly, BS normal. Musculoskeletal: No deformities, no cyanosis or clubbing Neuro:  alert, non focal       Assessment & Plan:    Chronic hypoxic respiratory failure Chronic hypoxic respiratory failure requiring four liters of continuous oxygen  since January. No evidence of pulmonary hypertension or PFO on right heart catheterization. Low diffusion capacity suggests possible undiagnosed severe emphysema. Current therapy with Breztri  and albuterol  is optimal. The cause of persistent hypoxia remains unclear despite optimal therapy and absence of fluid overload. - Check ambulatory saturation - Repeat pulmonary function tests to assess diffusion capacity  Moderate COPD Moderate COPD with FEV1 of 60%. Current treatment with Breztri  and albuterol  is appropriate. Recent exacerbation led to hospitalization, but no acute issues currently. Lung capacity at 65% indicates one-third lung damage, not typically warranting continuous oxygen  therapy at four liters but DLCO of 39% represents lung function more accurately Repeat PFTs to assess Consider Ohtuwayre in future  Reduced ejection fraction Reduced ejection fraction at 30-35% on recent echocardiogram, down from previous 50-60%. Right and left heart catheterization in March showed no clear etiology for the drop. The decline is concerning and requires further investigation. - Low SBP today, hold losartan  if remains low on home check

## 2023-11-09 DIAGNOSIS — J9621 Acute and chronic respiratory failure with hypoxia: Secondary | ICD-10-CM | POA: Diagnosis not present

## 2023-11-09 DIAGNOSIS — J441 Chronic obstructive pulmonary disease with (acute) exacerbation: Secondary | ICD-10-CM | POA: Diagnosis not present

## 2023-11-09 DIAGNOSIS — I2510 Atherosclerotic heart disease of native coronary artery without angina pectoris: Secondary | ICD-10-CM | POA: Diagnosis not present

## 2023-11-09 DIAGNOSIS — I480 Paroxysmal atrial fibrillation: Secondary | ICD-10-CM | POA: Diagnosis not present

## 2023-11-10 ENCOUNTER — Encounter (HOSPITAL_COMMUNITY): Payer: Self-pay | Admitting: Hematology and Oncology

## 2023-11-10 ENCOUNTER — Telehealth: Payer: Self-pay

## 2023-11-11 ENCOUNTER — Inpatient Hospital Stay (HOSPITAL_BASED_OUTPATIENT_CLINIC_OR_DEPARTMENT_OTHER): Admitting: Hematology and Oncology

## 2023-11-11 ENCOUNTER — Inpatient Hospital Stay: Attending: Internal Medicine

## 2023-11-11 VITALS — BP 106/54 | HR 74 | Temp 98.4°F | Resp 20 | Wt 120.3 lb

## 2023-11-11 DIAGNOSIS — D539 Nutritional anemia, unspecified: Secondary | ICD-10-CM | POA: Diagnosis not present

## 2023-11-11 DIAGNOSIS — Z87891 Personal history of nicotine dependence: Secondary | ICD-10-CM | POA: Insufficient documentation

## 2023-11-11 DIAGNOSIS — D509 Iron deficiency anemia, unspecified: Secondary | ICD-10-CM

## 2023-11-11 DIAGNOSIS — D638 Anemia in other chronic diseases classified elsewhere: Secondary | ICD-10-CM | POA: Insufficient documentation

## 2023-11-11 DIAGNOSIS — J449 Chronic obstructive pulmonary disease, unspecified: Secondary | ICD-10-CM | POA: Diagnosis not present

## 2023-11-11 DIAGNOSIS — Z9981 Dependence on supplemental oxygen: Secondary | ICD-10-CM | POA: Insufficient documentation

## 2023-11-11 DIAGNOSIS — Z853 Personal history of malignant neoplasm of breast: Secondary | ICD-10-CM | POA: Insufficient documentation

## 2023-11-11 LAB — CBC WITH DIFFERENTIAL/PLATELET
Abs Immature Granulocytes: 0.06 10*3/uL (ref 0.00–0.07)
Basophils Absolute: 0 10*3/uL (ref 0.0–0.1)
Basophils Relative: 0 %
Eosinophils Absolute: 0.3 10*3/uL (ref 0.0–0.5)
Eosinophils Relative: 4 %
HCT: 29.5 % — ABNORMAL LOW (ref 36.0–46.0)
Hemoglobin: 9.7 g/dL — ABNORMAL LOW (ref 12.0–15.0)
Immature Granulocytes: 1 %
Lymphocytes Relative: 8 %
Lymphs Abs: 0.6 10*3/uL — ABNORMAL LOW (ref 0.7–4.0)
MCH: 32.7 pg (ref 26.0–34.0)
MCHC: 32.9 g/dL (ref 30.0–36.0)
MCV: 99.3 fL (ref 80.0–100.0)
Monocytes Absolute: 0.5 10*3/uL (ref 0.1–1.0)
Monocytes Relative: 6 %
Neutro Abs: 5.9 10*3/uL (ref 1.7–7.7)
Neutrophils Relative %: 81 %
Platelets: 156 10*3/uL (ref 150–400)
RBC: 2.97 MIL/uL — ABNORMAL LOW (ref 3.87–5.11)
RDW: 15 % (ref 11.5–15.5)
WBC: 7.3 10*3/uL (ref 4.0–10.5)
nRBC: 0 % (ref 0.0–0.2)

## 2023-11-11 NOTE — Progress Notes (Signed)
 [facility] CONSULT NOTE  Patient Care Team: Imelda Man, MD as PCP - General Katheryne Pane Frederico Jan, MD as PCP - Cardiology (Cardiology) Homsher, Candice Chalet, RN as VBCI Care Management  CHIEF COMPLAINTS/PURPOSE OF CONSULTATION:  IDA  ASSESSMENT & PLAN:  This is a very pleasant 83 yr female pt with PMH significant for right-sided breast cancer, A-fib on anticoagulation, COPD, iron deficiency anemia referred to hematology for evaluation and recommendations regarding the anemia. Assessment & Plan  Anemia of chronic disease Anemia likely due to chronic lung issues. Hemoglobin improved to 9.7. Bone marrow biopsy and FISH normal. Iron supplementation increased ferritin levels. Previous anemia work up is neg. - Continue iron supplementation every other day or two times a week. - Recommend a multivitamin for women over fifty. - Cytogenetics normal.  Chronic obstructive pulmonary disease (COPD) COPD with oxygen  requirement at level four. Long-term oxygen  use may be necessary due to lung damage and recent pneumonia. Future needs depend on lung healing. - Monitor oxygen  requirements and lung function over the next three to six months.   HISTORY OF PRESENTING ILLNESS:  Dana Pruitt 83 y.o. female is here because of IDA.  Discussed the use of AI scribe software for clinical note transcription with the patient, who gave verbal consent to proceed.  History of Present Illness  Dana Pruitt is an 83 year old female with anemia and COPD who presents for follow-up after recent infections and bone marrow biopsy.  Her hemoglobin levels have improved to 9.7 g/dL from previous levels of 8.5, 8.3, and 8.8 g/dL over the past month, coinciding with her recovery from multiple infections. Recent blood work has not revealed any major concerns, and a bone marrow biopsy performed last week showed no evidence of cancer. FISH results for myelodysplasia were normal.  Her ferritin level has  increased from 20 in October of last year to 100 after starting iron supplementation. She takes iron every other day. She also takes vitamin D  and vitamin C supplements.  She has a history of COPD and is currently on oxygen  therapy at level 4. She inquires about the possibility of reducing her oxygen  level. She has a history of double pneumonia several years ago, from which she recovered, but notes that she is now ten years older and has experienced additional complications.   All other systems were reviewed with the patient and are negative.  MEDICAL HISTORY:  Past Medical History:  Diagnosis Date   Anemia    "several times over the years" (10/17/2012)   Anxiety    Breast cancer (HCC) 02/2010   right breast   CAD (coronary artery disease)    a. LHC 12/2018: mild non-obstructive CAD   Carcinoma of breast treated with adjuvant hormone therapy (HCC) 02/2010   Femara    Chronic systolic CHF    a. Echo in 2015: LVEF of 35-40%, b. Echo in 12/2021: LVEF of 60-65%   COPD (chronic obstructive pulmonary disease) (HCC)    Depression    GERD (gastroesophageal reflux disease)    diet controlled, no med   Hollenhorst plaque, left eye    apparently resolved at recent ophthalmology visit   Hyperlipidemia    Hypertension    IDC, RightStage II, 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   Moderate aortic insufficiency    Non-ischemic cardiomyopathy (HCC)    Paroxysmal atrial fibrillation (HCC)    Pneumonia    "that's why I'm here" (10/17/2012)   S/P AVR (aortic valve replacement) 04/26/2019   Stroke (HCC)  12/2021    SURGICAL HISTORY: Past Surgical History:  Procedure Laterality Date   AORTIC VALVE REPLACEMENT N/A 01/31/2019   Procedure: AORTIC VALVE REPLACEMENT (AVR) USING 21 MM INSPIRIS RESILIS AORTIC VALVE. SN: [id number];  Surgeon: Matt Song, Donata Fryer, MD;  Location: [facility] OR;  Service: Open Heart Surgery;  Laterality: N/A;   APPENDECTOMY  1974   BREAST BIOPSY Right 02/27/10   Needle core Biopsy; Invasive  Mammary; ER/PR Positive, Her-2 Neu negative, Ki-67 22%   BREAST LUMPECTOMY WITH SENTINEL LYMPH NODE BIOPSY Right 07/03/11   Invasive Ductal Carcinoma;0/3 nodes negative,; ER,PR Positive, Her-2 Neg; Ki-67 22%   CESAREAN SECTION  1970; 1974   COLONOSCOPY     DILATION AND CURETTAGE OF UTERUS  1970's   RIGHT HEART CATH AND CORONARY ANGIOGRAPHY N/A 08/31/2023   Procedure: RIGHT HEART CATH AND CORONARY ANGIOGRAPHY;  Surgeon: Arnoldo Lapping, MD;  Location: [facility] INVASIVE CV LAB;  Service: Cardiovascular;  Laterality: N/A;   RIGHT/LEFT HEART CATH AND CORONARY ANGIOGRAPHY N/A 01/02/2019   Procedure: RIGHT/LEFT HEART CATH AND CORONARY ANGIOGRAPHY;  Surgeon: Darlis Eisenmenger, MD;  Location: [facility] INVASIVE CV LAB;  Service: Cardiovascular;  Laterality: N/A;   TEE WITHOUT CARDIOVERSION N/A 01/31/2019   Procedure: TRANSESOPHAGEAL ECHOCARDIOGRAM (TEE);  Surgeon: Matt Song, Donata Fryer, MD;  Location: [facility] OR;  Service: Open Heart Surgery;  Laterality: N/A;   TUBAL LIGATION  1974   WISDOM TOOTH EXTRACTION      SOCIAL HISTORY: Social History   Socioeconomic History   Marital status: Married    Spouse name: Not on file   Number of children: 2   Years of education: college   Highest education level: Bachelor's degree (e.g., BA, AB, BS)  Occupational History    Employer: RETIRED  Tobacco Use   Smoking status: Former    Current packs/day: 0.00    Average packs/day: 3.0 packs/day for 32.1 years (96.3 ttl pk-yrs)    Types: Cigarettes    Start date: 05/19/1958    Quit date: 06/30/1990    Years since quitting: 33.3   Smokeless tobacco: Never   Tobacco comments:    Stopped smoking for 2-3 years during second child.   Vaping Use   Vaping status: Never Used  Substance and Sexual Activity   Alcohol use: Not Currently   Drug use: No   Sexual activity: Yes    Birth control/protection: Post-menopausal    Comment: Menarche age 17, G24,P2, Menopause in 58"s, No HRT Married  Other Topics Concern   Not on file  Social  History Narrative   [facility] (12/29/16):   Originally from [place], [place]. Moved to [place] at 83 y.o. Does have a dog. Hasn't worked outside the home. Remote parakeet exposure from her children. No mold or hot tub exposure. Enjoys doing yard work & walking.    Social Drivers of Corporate investment banker Strain: Not on file  Food Insecurity: No Food Insecurity (10/21/2023)   Hunger Vital Sign    Worried About Running Out of Food in the Last Year: Never true    Ran Out of Food in the Last Year: Never true  Transportation Needs: No Transportation Needs (10/21/2023)   PRAPARE - Administrator, Civil Service (Medical): No    Lack of Transportation (Non-Medical): No  Physical Activity: Not on file  Stress: Not on file  Social Connections: Moderately Integrated (10/21/2023)   Social Connection and Isolation Panel [NHANES]    Frequency of Communication with Friends and Family: More than three times a  week    Frequency of Social Gatherings with Friends and Family: More than three times a week    Attends Religious Services: Never    Database administrator or Organizations: Yes    Attends Engineer, structural: More than 4 times per year    Marital Status: Married  Catering manager Violence: Not At Risk (10/21/2023)   Humiliation, Afraid, Rape, and Kick questionnaire    Fear of Current or Ex-Partner: No    Emotionally Abused: No    Physically Abused: No    Sexually Abused: No    FAMILY HISTORY: Family History  Problem Relation Age of Onset   Heart disease Mother    Cancer Father    Heart disease Maternal Grandmother    Heart disease Maternal Grandfather    Cancer Paternal Grandmother    Lung disease Neg Hx     ALLERGIES:  has no known allergies.  MEDICATIONS:  Current Outpatient Medications  Medication Sig Dispense Refill   albuterol  (PROVENTIL ) (2.5 MG/3ML) 0.083% nebulizer solution Take 3 mLs (2.5 mg total) by nebulization every 6 (six) hours as needed for  wheezing or shortness of breath. 75 mL 5   albuterol  (VENTOLIN  HFA) 108 (90 Base) MCG/ACT inhaler Inhale 2 puffs into the lungs every 6 (six) hours as needed for wheezing or shortness of breath. INHALE 2 PUFFS BY MOUTH EVERY 4 HOURS AS NEEDED ONLY IF YOU CAN'T CATCH YOUR BREATH 18 g 3   amiodarone  (PACERONE ) 200 MG tablet Take 1 tablet (200 mg total) by mouth daily. 90 tablet 3   apixaban  (ELIQUIS ) 2.5 MG TABS tablet Take 1 tablet (2.5 mg total) by mouth 2 (two) times daily. 180 tablet 1   atorvastatin  (LIPITOR) 20 MG tablet Take 20 mg by mouth daily.      bisoprolol  (ZEBETA ) 5 MG tablet Take 0.5 tablets (2.5 mg total) by mouth daily. 15 tablet 3   BREZTRI  AEROSPHERE 160-9-4.8 MCG/ACT AERO INHALE 2 PUFFS INTO THE LUNGS TWICE DAILY 10.7 g 11   Cholecalciferol  (VITAMIN D3) 25 MCG (1000 UT) CAPS Take 1,000 Units by mouth daily.     ezetimibe  (ZETIA ) 10 MG tablet Take 10 mg by mouth daily.     FEROSUL 325 (65 Fe) MG tablet Take 325 mg by mouth every other day.     fluticasone  (FLONASE ) 50 MCG/ACT nasal spray SHAKE LIQUID AND USE 2 SPRAYS IN EACH NOSTRIL DAILY 16 g 3   guaiFENesin  (MUCINEX ) 600 MG 12 hr tablet Take 1 tablet (600 mg total) by mouth 2 (two) times daily. 30 tablet 0   guaiFENesin -dextromethorphan  (ROBITUSSIN DM) 100-10 MG/5ML syrup Take 5 mLs by mouth every 4 (four) hours as needed for cough. 118 mL 0   ipratropium (ATROVENT ) 0.06 % nasal spray Place 2 sprays into both nostrils 4 (four) times daily. 15 mL 12   losartan  (COZAAR ) 25 MG tablet Take 25 mg by mouth daily.     OXYGEN  Inhale 4 L/min into the lungs continuous.     predniSONE  (DELTASONE ) 10 MG tablet 3 tabs daily for 3 days then 2 tabs daily for 3 days then 1 tab daily for 3 days (Patient not taking: Reported on 11/05/2023) 18 tablet 0   sertraline  (ZOLOFT ) 100 MG tablet Take 200 mg by mouth daily.     valACYclovir (VALTREX) 1000 MG tablet Take 500 mg by mouth daily as needed (cold sores).     No current facility-administered  medications for this visit.   Facility-Administered Medications Ordered in Other  Visits  Medication Dose Route Frequency Provider Last Rate Last Admin   sodium chloride  flush (NS) 0.9 % injection 3 mL  3 mL Intravenous Q12H Darlis Eisenmenger, MD         PHYSICAL EXAMINATION: ECOG PERFORMANCE STATUS: 1 - Symptomatic but completely ambulatory  Vitals:   11/11/23 1540  BP: (!) 106/54  Pulse: 74  Resp: 20  Temp: 98.4 F (36.9 C)  SpO2: (!) 87%    Filed Weights   11/11/23 1540  Weight: 120 lb 4.8 oz (54.6 kg)     GENERAL:alert, no distress and comfortable Chest: CTA bilaterally. No adv sounds. Heart: RRR Abdomen: soft, non tender, non distended, no organomegaly No LE edema.  LABORATORY DATA:  I have reviewed the data as listed Lab Results  Component Value Date   WBC 7.3 11/11/2023   HGB 9.7 (L) 11/11/2023   HCT 29.5 (L) 11/11/2023   MCV 99.3 11/11/2023   PLT 156 11/11/2023     Chemistry      Component Value Date/Time   NA 139 10/25/2023 0423   NA 136 09/09/2023 1659   NA 138 11/27/2014 1017   K 3.7 10/25/2023 0423   K 4.9 11/27/2014 1017   CL 104 10/25/2023 0423   CL 105 03/31/2012 1410   CO2 26 10/25/2023 0423   CO2 25 11/27/2014 1017   BUN 33 (H) 10/25/2023 0423   BUN 22 09/09/2023 1659   BUN 18.7 11/27/2014 1017   CREATININE 1.14 (H) 10/25/2023 0423   CREATININE 1.19 (H) 03/23/2023 1117   CREATININE 1.0 11/27/2014 1017      Component Value Date/Time   CALCIUM  8.9 10/25/2023 0423   CALCIUM  10.1 11/27/2014 1017   ALKPHOS 42 10/22/2023 0746   ALKPHOS 35 (L) 11/27/2014 1017   AST 17 10/22/2023 0746   AST 16 03/23/2023 1117   AST 19 11/27/2014 1017   ALT 13 10/22/2023 0746   ALT 12 03/23/2023 1117   ALT 12 11/27/2014 1017   BILITOT 0.7 10/22/2023 0746   BILITOT 0.2 09/09/2023 1659   BILITOT 0.6 03/23/2023 1117   BILITOT 0.45 11/27/2014 1017       RADIOGRAPHIC STUDIES: I have personally reviewed the radiological images as listed and agreed with  the findings in the report. CT BONE MARROW BIOPSY & ASPIRATION Result Date: 11/02/2023 CLINICAL DATA:  Macrocytic anemia and need for bone marrow biopsy. EXAM: CT GUIDED BONE MARROW ASPIRATION AND BIOPSY ANESTHESIA/SEDATION: Moderate (conscious) sedation was employed during this procedure. A total of Versed  1.0 mg and Fentanyl  50 mcg was administered intravenously. Moderate Sedation Time: 10 minutes. The patient's level of consciousness and vital signs were monitored continuously by radiology nursing throughout the procedure under my direct supervision. PROCEDURE: The procedure risks, benefits, and alternatives were explained to the patient. Questions regarding the procedure were encouraged and answered. The patient understands and consents to the procedure. A time out was performed prior to initiating the procedure. The right gluteal region was prepped with chlorhexidine . Sterile gown and sterile gloves were used for the procedure. Local anesthesia was provided with 1% Lidocaine . Under CT guidance, an 11 gauge On Control bone cutting needle was advanced from a posterior approach into the right iliac bone. Needle positioning was confirmed with CT. Initial non heparinized and heparinized aspirate samples were obtained of bone marrow. Core biopsy was performed via the On Control drill needle. COMPLICATIONS: None FINDINGS: Inspection of initial aspirate did reveal visible particles. Intact core biopsy sample was obtained. IMPRESSION: CT guided bone  marrow biopsy of right posterior iliac bone with both aspirate and core samples obtained. Electronically Signed   By: Erica Hau M.D.   On: 11/02/2023 11:33   ECHOCARDIOGRAM LIMITED Result Date: 10/25/2023    ECHOCARDIOGRAM LIMITED REPORT   Patient Name:   Dana Pruitt Date of Exam: 10/25/2023 Medical Rec #:  [id number]       Height:       64.0 in Accession #:    [id number]      Weight:       120.1 lb Date of Birth:  15-Aug-1940      BSA:          1.575 m Patient  Age:    82 years        BP:           152/89 mmHg Patient Gender: F               HR:           69 bpm. Exam Location:  Inpatient Procedure: Limited Echo and Limited Color Doppler (Both Spectral and Color Flow            Doppler were utilized during procedure). Indications:    CHF-Acute Systolic I50.21  History:        Patient has prior history of Echocardiogram examinations, most                 recent 09/05/2023. CHF and Cardiomyopathy, Pulmonary HTN and                 COPD, Arrythmias:Atrial Fibrillation, Signs/Symptoms:Chest Pain;                 Risk Factors:Hypertension and Dyslipidemia.                 Aortic Valve: 21 mm a bioprosthetic valve is present in the                 aortic position. Procedure Date: 04/26/19.  Sonographer:    Terrilee Few RCS Referring Phys: [id number] RAMESH KC IMPRESSIONS  1. Left ventricular ejection fraction, by estimation, is 35 to 40%. The left ventricle has moderately decreased function. The left ventricle demonstrates global hypokinesis.  2. The mitral valve is normal in structure. Mild mitral valve regurgitation.  3. Tricuspid valve regurgitation is mild to moderate.  4. The aortic valve has been repaired/replaced. Aortic valve regurgitation is not visualized. Aortic valve sclerosis is present, with no evidence of aortic valve stenosis. There is a 21 mm a bioprosthetic valve present in the aortic position. Procedure Date: 04/26/19. Aortic valve mean gradient measures 10.0 mmHg.  5. Aortic dilatation noted. There is mild dilatation of the ascending aorta, measuring 44 mm.  6. There is normal pulmonary artery systolic pressure.  7. The inferior vena cava is normal in size with greater than 50% respiratory variability, suggesting right atrial pressure of 3 mmHg. Comparison(s): Prior images reviewed side by side. Visually appears unchanged from prior. FINDINGS  Left Ventricle: Left ventricular ejection fraction, by estimation, is 35 to 40%. The left ventricle has moderately  decreased function. The left ventricle demonstrates global hypokinesis. Definity  contrast agent was given IV to delineate the left ventricular endocardial borders. The left ventricular internal cavity size was normal in size. Abnormal (paradoxical) septal motion consistent with post-operative status. Right Ventricle: There is normal pulmonary artery systolic pressure. The tricuspid regurgitant velocity is 2.61 m/s, and with an assumed right atrial pressure of 3 mmHg, the estimated  right ventricular systolic pressure is 30.2 mmHg. Mitral Valve: The mitral valve is normal in structure. Mild mitral valve regurgitation. Tricuspid Valve: The tricuspid valve is normal in structure. Tricuspid valve regurgitation is mild to moderate. No evidence of tricuspid stenosis. Aortic Valve: The aortic valve has been repaired/replaced. Aortic valve regurgitation is not visualized. Aortic valve sclerosis is present, with no evidence of aortic valve stenosis. Aortic valve mean gradient measures 10.0 mmHg. Aortic valve peak gradient measures 17.7 mmHg. Aortic valve area, by VTI measures 0.95 cm. There is a 21 mm a bioprosthetic valve present in the aortic position. Procedure Date: 04/26/19. Pulmonic Valve: The pulmonic valve was grossly normal. Pulmonic valve regurgitation is trivial. No evidence of pulmonic stenosis. Aorta: Aortic dilatation noted. There is mild dilatation of the ascending aorta, measuring 44 mm. Venous: The inferior vena cava is normal in size with greater than 50% respiratory variability, suggesting right atrial pressure of 3 mmHg. LEFT VENTRICLE PLAX 2D LVIDd:         4.24 cm      Diastology LVIDs:         3.40 cm      LV e' medial:    5.35 cm/s LV PW:         1.00 cm      LV E/e' medial:  13.0 LV IVS:        1.00 cm      LV e' lateral:   7.61 cm/s LVOT diam:     1.80 cm      LV E/e' lateral: 9.1 LV SV:         39 LV SV Index:   25 LVOT Area:     2.54 cm  LV Volumes (MOD) LV vol d, MOD A4C: 113.0 ml LV SV MOD A4C:      113.0 ml RIGHT VENTRICLE             IVC RV S prime:     10.20 cm/s  IVC diam: 1.80 cm TAPSE (M-mode): 1.6 cm LEFT ATRIUM         Index LA diam:    3.90 cm 2.48 cm/m  AORTIC VALVE AV Area (Vmax):    0.99 cm AV Area (Vmean):   0.90 cm AV Area (VTI):     0.95 cm AV Vmax:           210.50 cm/s AV Vmean:          142.500 cm/s AV VTI:            0.414 m AV Peak Grad:      17.7 mmHg AV Mean Grad:      10.0 mmHg LVOT Vmax:         82.10 cm/s LVOT Vmean:        50.400 cm/s LVOT VTI:          0.155 m LVOT/AV VTI ratio: 0.37  AORTA Ao Root diam: 2.50 cm Ao Asc diam:  4.40 cm MITRAL VALVE               TRICUSPID VALVE MV Area (PHT): 4.21 cm    TR Peak grad:   27.2 mmHg MV Decel Time: 180 msec    TR Vmax:        261.00 cm/s MV E velocity: 69.50 cm/s MV A velocity: 59.20 cm/s  SHUNTS MV E/A ratio:  1.17        Systemic VTI:  0.16 m  Systemic Diam: 1.80 cm Sheryle Donning MD Electronically signed by Sheryle Donning MD Signature Date/Time: 10/25/2023/5:55:23 PM    Final    DG Chest Port 1 View Result Date: 10/24/2023 CLINICAL DATA:  83 year old female with hypoxia, CHF. EXAM: PORTABLE CHEST 1 VIEW COMPARISON:  Portable chest 10/23/2023 and earlier. FINDINGS: Portable AP semi upright view at 0633 hours. Mildly rotated to the left now. Stable cardiac size and mediastinal contours. Previous sternotomy and cardiac valve replacement. Calcified aortic atherosclerosis. Visualized tracheal air column is within normal limits. Lower lung predominant increased interstitial markings with emphysema on CT this month. No superimposed pneumothorax, pulmonary edema. Streaky retrocardiac opacity appears stable and related to atelectasis or scarring on the recent CT. No definite progressed lung opacity when allowing for rotation, and also when compared to chest radiograph on 10/21/2023. No evidence of pleural effusion. Stable visualized osseous structures. Paucity of bowel gas. IMPRESSION: Mildly rotated  portable chest with emphysema, stable left lung base atelectasis/scarring. No acute cardiopulmonary abnormality. Electronically Signed   By: Marlise Simpers M.D.   On: 10/24/2023 08:42   DG Chest Port 1 View Result Date: 10/23/2023 CLINICAL DATA:  Hypoxia. EXAM: PORTABLE CHEST 1 VIEW COMPARISON:  10/21/2023 FINDINGS: Lungs are hyperexpanded. Patchy airspace disease is seen in the right mid lung in both lung bases, suspicious for multifocal pneumonia. Interstitial markings are diffusely coarsened with chronic features. Cardiopericardial silhouette is at upper limits of normal for size. No acute bony abnormality. Telemetry leads overlie the chest. IMPRESSION: Patchy airspace disease in the right mid lung and both lung bases, suspicious for multifocal pneumonia. Electronically Signed   By: Donnal Fusi M.D.   On: 10/23/2023 09:24   CT Angio Chest Pulmonary Embolism (PE) W or WO Contrast Result Date: 10/21/2023 CLINICAL DATA:  Pulmonary embolism (PE) suspected, high prob EXAM: CT ANGIOGRAPHY CHEST WITH CONTRAST TECHNIQUE: Multidetector CT imaging of the chest was performed using the standard protocol during bolus administration of intravenous contrast. Multiplanar CT image reconstructions and MIPs were obtained to evaluate the vascular anatomy. RADIATION DOSE REDUCTION: This exam was performed according to the departmental dose-optimization program which includes automated exposure control, adjustment of the mA and/or kV according to patient size and/or use of iterative reconstruction technique. CONTRAST:  60mL OMNIPAQUE  IOHEXOL  350 MG/ML SOLN COMPARISON:  CTA chest dated 09/13/2023. FINDINGS: Cardiovascular: Satisfactory opacification of the pulmonary arteries to the segmental level. No evidence of pulmonary embolism. Stable mild cardiomegaly. Multivessel coronary artery calcifications. Prior aortic valve replacement. Unchanged aneurysmal dilatation of the ascending thoracic aorta, measuring 4.5 cm. Atherosclerotic  calcification of the thoracic aorta and arch branch vessels. Unchanged dilation of the pulmonary trunk measuring 4.1 cm in diameter. Mediastinum/Nodes: No enlarged mediastinal, hilar, or axillary lymph nodes. Thyroid  gland, trachea, and esophagus demonstrate no significant findings. Lungs/Pleura: Paraseptal and centrilobular emphysema. Biapical pleuroparenchymal scarring. Bilateral bronchiectatic changes with probable mild mucous plugging at the left lung base. The previously noted consolidation in the lingula has nearly resolved. Similar fibrotic changes along the anterior aspect of the right upper lobe. Stable size of a few scattered sub 4 mm pulmonary nodules. No new suspicious or enlarging pulmonary nodule. No pleural effusion or pneumothorax. Upper Abdomen: No acute abnormality. Musculoskeletal: Prior median sternotomy. No acute osseous abnormality. No suspicious osseous lesion. Review of the MIP images confirms the above findings. IMPRESSION: 1. No evidence of pulmonary embolism. 2. Bilateral bronchiectatic changes with mild mucous plugging at the left lung base. The previously noted consolidation in the lingula has nearly resolved. 3. Aortic atherosclerosis  with aneurysmal dilation of the ascending thoracic aorta measuring 4.5 cm. Recommend semi-annual imaging followup by CTA or MRA and referral to cardiothoracic surgery if not already obtained. This recommendation follows 2010 ACCF/AHA/AATS/ACR/ASA/SCA/SCAI/SIR/STS/SVM Guidelines for the Diagnosis and Management of Patients With Thoracic Aortic Disease. Circulation. 2010; 121: A213-Y86. Aortic aneurysm NOS (ICD10-I71.9) 4. Similar dilation of the pulmonary trunk, which can be seen in the setting of pulmonary arterial hypertension. 5. Aortic Atherosclerosis (ICD10-I70.0) and Emphysema (ICD10-J43.9). Electronically Signed   By: Mannie Seek M.D.   On: 10/21/2023 18:01   DG Chest 2 View Result Date: 10/21/2023 CLINICAL DATA:  Shortness of breath. EXAM:  CHEST - 2 VIEW COMPARISON:  09/13/2023. FINDINGS: Stable cardiomegaly. Prior median sternotomy and valve prosthesis. Emphysema. Streaky retrocardiac opacities, could reflect atelectasis or infiltrate. The right lung appears clear. No pleural effusion or pneumothorax. No acute osseous abnormality. IMPRESSION: 1. Streaky retrocardiac opacities could reflect atelectasis or infiltrate. 2. Emphysema. Electronically Signed   By: Mannie Seek M.D.   On: 10/21/2023 15:02    All questions were answered. The patient knows to call the clinic with any problems, questions or concerns. I spent 30 minutes in the care of this patient including H and P, review of records, counseling and coordination of care.     Murleen Arms, MD 11/12/2023 8:25 AM

## 2023-11-12 ENCOUNTER — Encounter: Payer: Self-pay | Admitting: Oncology

## 2023-11-12 DIAGNOSIS — Z7951 Long term (current) use of inhaled steroids: Secondary | ICD-10-CM | POA: Diagnosis not present

## 2023-11-12 DIAGNOSIS — I712 Thoracic aortic aneurysm, without rupture, unspecified: Secondary | ICD-10-CM | POA: Diagnosis not present

## 2023-11-12 DIAGNOSIS — J309 Allergic rhinitis, unspecified: Secondary | ICD-10-CM | POA: Diagnosis not present

## 2023-11-12 DIAGNOSIS — M62838 Other muscle spasm: Secondary | ICD-10-CM | POA: Diagnosis not present

## 2023-11-12 DIAGNOSIS — J9621 Acute and chronic respiratory failure with hypoxia: Secondary | ICD-10-CM | POA: Diagnosis not present

## 2023-11-12 DIAGNOSIS — I110 Hypertensive heart disease with heart failure: Secondary | ICD-10-CM | POA: Diagnosis not present

## 2023-11-12 DIAGNOSIS — I2510 Atherosclerotic heart disease of native coronary artery without angina pectoris: Secondary | ICD-10-CM | POA: Diagnosis not present

## 2023-11-12 DIAGNOSIS — E785 Hyperlipidemia, unspecified: Secondary | ICD-10-CM | POA: Diagnosis not present

## 2023-11-12 DIAGNOSIS — I447 Left bundle-branch block, unspecified: Secondary | ICD-10-CM | POA: Diagnosis not present

## 2023-11-12 DIAGNOSIS — D509 Iron deficiency anemia, unspecified: Secondary | ICD-10-CM | POA: Diagnosis not present

## 2023-11-12 DIAGNOSIS — J441 Chronic obstructive pulmonary disease with (acute) exacerbation: Secondary | ICD-10-CM | POA: Diagnosis not present

## 2023-11-12 DIAGNOSIS — H34212 Partial retinal artery occlusion, left eye: Secondary | ICD-10-CM | POA: Diagnosis not present

## 2023-11-12 DIAGNOSIS — F419 Anxiety disorder, unspecified: Secondary | ICD-10-CM | POA: Diagnosis not present

## 2023-11-12 DIAGNOSIS — I480 Paroxysmal atrial fibrillation: Secondary | ICD-10-CM | POA: Diagnosis not present

## 2023-11-12 DIAGNOSIS — I081 Rheumatic disorders of both mitral and tricuspid valves: Secondary | ICD-10-CM | POA: Diagnosis not present

## 2023-11-12 DIAGNOSIS — I428 Other cardiomyopathies: Secondary | ICD-10-CM | POA: Diagnosis not present

## 2023-11-12 DIAGNOSIS — I2721 Secondary pulmonary arterial hypertension: Secondary | ICD-10-CM | POA: Diagnosis not present

## 2023-11-12 DIAGNOSIS — K219 Gastro-esophageal reflux disease without esophagitis: Secondary | ICD-10-CM | POA: Diagnosis not present

## 2023-11-12 DIAGNOSIS — Z7901 Long term (current) use of anticoagulants: Secondary | ICD-10-CM | POA: Diagnosis not present

## 2023-11-12 DIAGNOSIS — I2489 Other forms of acute ischemic heart disease: Secondary | ICD-10-CM | POA: Diagnosis not present

## 2023-11-12 DIAGNOSIS — F32A Depression, unspecified: Secondary | ICD-10-CM | POA: Diagnosis not present

## 2023-11-12 DIAGNOSIS — I5042 Chronic combined systolic (congestive) and diastolic (congestive) heart failure: Secondary | ICD-10-CM | POA: Diagnosis not present

## 2023-11-12 DIAGNOSIS — E43 Unspecified severe protein-calorie malnutrition: Secondary | ICD-10-CM | POA: Diagnosis not present

## 2023-11-12 DIAGNOSIS — J439 Emphysema, unspecified: Secondary | ICD-10-CM | POA: Diagnosis not present

## 2023-11-12 DIAGNOSIS — M8580 Other specified disorders of bone density and structure, unspecified site: Secondary | ICD-10-CM | POA: Diagnosis not present

## 2023-11-16 DIAGNOSIS — J439 Emphysema, unspecified: Secondary | ICD-10-CM | POA: Diagnosis not present

## 2023-11-16 DIAGNOSIS — I5042 Chronic combined systolic (congestive) and diastolic (congestive) heart failure: Secondary | ICD-10-CM | POA: Diagnosis not present

## 2023-11-16 DIAGNOSIS — F32A Depression, unspecified: Secondary | ICD-10-CM | POA: Diagnosis not present

## 2023-11-16 DIAGNOSIS — J441 Chronic obstructive pulmonary disease with (acute) exacerbation: Secondary | ICD-10-CM | POA: Diagnosis not present

## 2023-11-16 DIAGNOSIS — H34212 Partial retinal artery occlusion, left eye: Secondary | ICD-10-CM | POA: Diagnosis not present

## 2023-11-16 DIAGNOSIS — I2510 Atherosclerotic heart disease of native coronary artery without angina pectoris: Secondary | ICD-10-CM | POA: Diagnosis not present

## 2023-11-16 DIAGNOSIS — D509 Iron deficiency anemia, unspecified: Secondary | ICD-10-CM | POA: Diagnosis not present

## 2023-11-16 DIAGNOSIS — E785 Hyperlipidemia, unspecified: Secondary | ICD-10-CM | POA: Diagnosis not present

## 2023-11-16 DIAGNOSIS — J309 Allergic rhinitis, unspecified: Secondary | ICD-10-CM | POA: Diagnosis not present

## 2023-11-16 DIAGNOSIS — M8580 Other specified disorders of bone density and structure, unspecified site: Secondary | ICD-10-CM | POA: Diagnosis not present

## 2023-11-16 DIAGNOSIS — I081 Rheumatic disorders of both mitral and tricuspid valves: Secondary | ICD-10-CM | POA: Diagnosis not present

## 2023-11-16 DIAGNOSIS — E43 Unspecified severe protein-calorie malnutrition: Secondary | ICD-10-CM | POA: Diagnosis not present

## 2023-11-16 DIAGNOSIS — I428 Other cardiomyopathies: Secondary | ICD-10-CM | POA: Diagnosis not present

## 2023-11-16 DIAGNOSIS — J9621 Acute and chronic respiratory failure with hypoxia: Secondary | ICD-10-CM | POA: Diagnosis not present

## 2023-11-16 DIAGNOSIS — I480 Paroxysmal atrial fibrillation: Secondary | ICD-10-CM | POA: Diagnosis not present

## 2023-11-16 DIAGNOSIS — M62838 Other muscle spasm: Secondary | ICD-10-CM | POA: Diagnosis not present

## 2023-11-16 DIAGNOSIS — K219 Gastro-esophageal reflux disease without esophagitis: Secondary | ICD-10-CM | POA: Diagnosis not present

## 2023-11-16 DIAGNOSIS — Z7951 Long term (current) use of inhaled steroids: Secondary | ICD-10-CM | POA: Diagnosis not present

## 2023-11-16 DIAGNOSIS — I110 Hypertensive heart disease with heart failure: Secondary | ICD-10-CM | POA: Diagnosis not present

## 2023-11-16 DIAGNOSIS — I2489 Other forms of acute ischemic heart disease: Secondary | ICD-10-CM | POA: Diagnosis not present

## 2023-11-16 DIAGNOSIS — I447 Left bundle-branch block, unspecified: Secondary | ICD-10-CM | POA: Diagnosis not present

## 2023-11-16 DIAGNOSIS — F419 Anxiety disorder, unspecified: Secondary | ICD-10-CM | POA: Diagnosis not present

## 2023-11-16 DIAGNOSIS — I2721 Secondary pulmonary arterial hypertension: Secondary | ICD-10-CM | POA: Diagnosis not present

## 2023-11-16 DIAGNOSIS — I712 Thoracic aortic aneurysm, without rupture, unspecified: Secondary | ICD-10-CM | POA: Diagnosis not present

## 2023-11-16 DIAGNOSIS — Z7901 Long term (current) use of anticoagulants: Secondary | ICD-10-CM | POA: Diagnosis not present

## 2023-11-18 ENCOUNTER — Other Ambulatory Visit: Payer: Self-pay | Admitting: Nurse Practitioner

## 2023-11-18 DIAGNOSIS — I5033 Acute on chronic diastolic (congestive) heart failure: Secondary | ICD-10-CM

## 2023-11-19 DIAGNOSIS — F32A Depression, unspecified: Secondary | ICD-10-CM | POA: Diagnosis not present

## 2023-11-19 DIAGNOSIS — D509 Iron deficiency anemia, unspecified: Secondary | ICD-10-CM | POA: Diagnosis not present

## 2023-11-19 DIAGNOSIS — I5042 Chronic combined systolic (congestive) and diastolic (congestive) heart failure: Secondary | ICD-10-CM | POA: Diagnosis not present

## 2023-11-19 DIAGNOSIS — E785 Hyperlipidemia, unspecified: Secondary | ICD-10-CM | POA: Diagnosis not present

## 2023-11-19 DIAGNOSIS — Z7901 Long term (current) use of anticoagulants: Secondary | ICD-10-CM | POA: Diagnosis not present

## 2023-11-19 DIAGNOSIS — J441 Chronic obstructive pulmonary disease with (acute) exacerbation: Secondary | ICD-10-CM | POA: Diagnosis not present

## 2023-11-19 DIAGNOSIS — I2721 Secondary pulmonary arterial hypertension: Secondary | ICD-10-CM | POA: Diagnosis not present

## 2023-11-19 DIAGNOSIS — M8580 Other specified disorders of bone density and structure, unspecified site: Secondary | ICD-10-CM | POA: Diagnosis not present

## 2023-11-19 DIAGNOSIS — H34212 Partial retinal artery occlusion, left eye: Secondary | ICD-10-CM | POA: Diagnosis not present

## 2023-11-19 DIAGNOSIS — I081 Rheumatic disorders of both mitral and tricuspid valves: Secondary | ICD-10-CM | POA: Diagnosis not present

## 2023-11-19 DIAGNOSIS — F419 Anxiety disorder, unspecified: Secondary | ICD-10-CM | POA: Diagnosis not present

## 2023-11-19 DIAGNOSIS — E43 Unspecified severe protein-calorie malnutrition: Secondary | ICD-10-CM | POA: Diagnosis not present

## 2023-11-19 DIAGNOSIS — I428 Other cardiomyopathies: Secondary | ICD-10-CM | POA: Diagnosis not present

## 2023-11-19 DIAGNOSIS — M62838 Other muscle spasm: Secondary | ICD-10-CM | POA: Diagnosis not present

## 2023-11-19 DIAGNOSIS — I2510 Atherosclerotic heart disease of native coronary artery without angina pectoris: Secondary | ICD-10-CM | POA: Diagnosis not present

## 2023-11-19 DIAGNOSIS — I447 Left bundle-branch block, unspecified: Secondary | ICD-10-CM | POA: Diagnosis not present

## 2023-11-19 DIAGNOSIS — I2489 Other forms of acute ischemic heart disease: Secondary | ICD-10-CM | POA: Diagnosis not present

## 2023-11-19 DIAGNOSIS — I712 Thoracic aortic aneurysm, without rupture, unspecified: Secondary | ICD-10-CM | POA: Diagnosis not present

## 2023-11-19 DIAGNOSIS — J9621 Acute and chronic respiratory failure with hypoxia: Secondary | ICD-10-CM | POA: Diagnosis not present

## 2023-11-19 DIAGNOSIS — J439 Emphysema, unspecified: Secondary | ICD-10-CM | POA: Diagnosis not present

## 2023-11-19 DIAGNOSIS — I110 Hypertensive heart disease with heart failure: Secondary | ICD-10-CM | POA: Diagnosis not present

## 2023-11-19 DIAGNOSIS — J309 Allergic rhinitis, unspecified: Secondary | ICD-10-CM | POA: Diagnosis not present

## 2023-11-19 DIAGNOSIS — K219 Gastro-esophageal reflux disease without esophagitis: Secondary | ICD-10-CM | POA: Diagnosis not present

## 2023-11-19 DIAGNOSIS — Z7951 Long term (current) use of inhaled steroids: Secondary | ICD-10-CM | POA: Diagnosis not present

## 2023-11-19 DIAGNOSIS — I480 Paroxysmal atrial fibrillation: Secondary | ICD-10-CM | POA: Diagnosis not present

## 2023-11-19 NOTE — Telephone Encounter (Signed)
 Needs to follow up with cardiology for refills. Thanks.

## 2023-11-22 DIAGNOSIS — J309 Allergic rhinitis, unspecified: Secondary | ICD-10-CM | POA: Diagnosis not present

## 2023-11-22 DIAGNOSIS — I428 Other cardiomyopathies: Secondary | ICD-10-CM | POA: Diagnosis not present

## 2023-11-22 DIAGNOSIS — Z7951 Long term (current) use of inhaled steroids: Secondary | ICD-10-CM | POA: Diagnosis not present

## 2023-11-22 DIAGNOSIS — H34212 Partial retinal artery occlusion, left eye: Secondary | ICD-10-CM | POA: Diagnosis not present

## 2023-11-22 DIAGNOSIS — I2721 Secondary pulmonary arterial hypertension: Secondary | ICD-10-CM | POA: Diagnosis not present

## 2023-11-22 DIAGNOSIS — J439 Emphysema, unspecified: Secondary | ICD-10-CM | POA: Diagnosis not present

## 2023-11-22 DIAGNOSIS — I480 Paroxysmal atrial fibrillation: Secondary | ICD-10-CM | POA: Diagnosis not present

## 2023-11-22 DIAGNOSIS — J441 Chronic obstructive pulmonary disease with (acute) exacerbation: Secondary | ICD-10-CM | POA: Diagnosis not present

## 2023-11-22 DIAGNOSIS — I2489 Other forms of acute ischemic heart disease: Secondary | ICD-10-CM | POA: Diagnosis not present

## 2023-11-22 DIAGNOSIS — Z7901 Long term (current) use of anticoagulants: Secondary | ICD-10-CM | POA: Diagnosis not present

## 2023-11-22 DIAGNOSIS — E785 Hyperlipidemia, unspecified: Secondary | ICD-10-CM | POA: Diagnosis not present

## 2023-11-22 DIAGNOSIS — I447 Left bundle-branch block, unspecified: Secondary | ICD-10-CM | POA: Diagnosis not present

## 2023-11-22 DIAGNOSIS — M8580 Other specified disorders of bone density and structure, unspecified site: Secondary | ICD-10-CM | POA: Diagnosis not present

## 2023-11-22 DIAGNOSIS — F419 Anxiety disorder, unspecified: Secondary | ICD-10-CM | POA: Diagnosis not present

## 2023-11-22 DIAGNOSIS — M62838 Other muscle spasm: Secondary | ICD-10-CM | POA: Diagnosis not present

## 2023-11-22 DIAGNOSIS — I712 Thoracic aortic aneurysm, without rupture, unspecified: Secondary | ICD-10-CM | POA: Diagnosis not present

## 2023-11-22 DIAGNOSIS — D509 Iron deficiency anemia, unspecified: Secondary | ICD-10-CM | POA: Diagnosis not present

## 2023-11-22 DIAGNOSIS — I081 Rheumatic disorders of both mitral and tricuspid valves: Secondary | ICD-10-CM | POA: Diagnosis not present

## 2023-11-22 DIAGNOSIS — I5042 Chronic combined systolic (congestive) and diastolic (congestive) heart failure: Secondary | ICD-10-CM | POA: Diagnosis not present

## 2023-11-22 DIAGNOSIS — I2510 Atherosclerotic heart disease of native coronary artery without angina pectoris: Secondary | ICD-10-CM | POA: Diagnosis not present

## 2023-11-22 DIAGNOSIS — J9621 Acute and chronic respiratory failure with hypoxia: Secondary | ICD-10-CM | POA: Diagnosis not present

## 2023-11-22 DIAGNOSIS — K219 Gastro-esophageal reflux disease without esophagitis: Secondary | ICD-10-CM | POA: Diagnosis not present

## 2023-11-22 DIAGNOSIS — F32A Depression, unspecified: Secondary | ICD-10-CM | POA: Diagnosis not present

## 2023-11-22 DIAGNOSIS — I110 Hypertensive heart disease with heart failure: Secondary | ICD-10-CM | POA: Diagnosis not present

## 2023-11-22 DIAGNOSIS — E43 Unspecified severe protein-calorie malnutrition: Secondary | ICD-10-CM | POA: Diagnosis not present

## 2023-11-24 ENCOUNTER — Other Ambulatory Visit (HOSPITAL_COMMUNITY): Payer: Self-pay

## 2023-11-25 ENCOUNTER — Other Ambulatory Visit: Payer: Medicare Other

## 2023-11-25 ENCOUNTER — Ambulatory Visit: Payer: Medicare Other | Admitting: Hematology and Oncology

## 2023-11-25 ENCOUNTER — Telehealth: Payer: Self-pay | Admitting: *Deleted

## 2023-11-25 DIAGNOSIS — J9611 Chronic respiratory failure with hypoxia: Secondary | ICD-10-CM | POA: Diagnosis not present

## 2023-11-25 DIAGNOSIS — I5022 Chronic systolic (congestive) heart failure: Secondary | ICD-10-CM | POA: Diagnosis not present

## 2023-11-25 DIAGNOSIS — D649 Anemia, unspecified: Secondary | ICD-10-CM | POA: Diagnosis not present

## 2023-11-25 DIAGNOSIS — J441 Chronic obstructive pulmonary disease with (acute) exacerbation: Secondary | ICD-10-CM

## 2023-11-25 DIAGNOSIS — R195 Other fecal abnormalities: Secondary | ICD-10-CM | POA: Diagnosis not present

## 2023-11-25 DIAGNOSIS — J439 Emphysema, unspecified: Secondary | ICD-10-CM

## 2023-11-25 DIAGNOSIS — J4489 Other specified chronic obstructive pulmonary disease: Secondary | ICD-10-CM

## 2023-11-25 NOTE — Telephone Encounter (Signed)
 Dr Villa Greaser please advise as we just did walk and pt was on 4L in office at walk.Also new neb order okay?

## 2023-11-25 NOTE — Telephone Encounter (Signed)
 Copied from CRM [id number]. Topic: Clinical - Medical Advice >> Nov 22, 2023  2:05 PM Juliana Ocean wrote: Reason for CRM: Princess from [facility] states 1. pt needs to be at 5 liters of O2 to be above 90. 2  pt needs a new nebulizer.  The current one she has is broken. pt's vitals are all stable  C/b [phone]  [facility] and there was no answer- LMTCB

## 2023-11-26 NOTE — Telephone Encounter (Signed)
 New neb order placed tried to call Princess back at number listed but not a working number

## 2023-11-29 ENCOUNTER — Other Ambulatory Visit: Payer: Self-pay | Admitting: Internal Medicine

## 2023-11-30 DIAGNOSIS — I081 Rheumatic disorders of both mitral and tricuspid valves: Secondary | ICD-10-CM | POA: Diagnosis not present

## 2023-11-30 DIAGNOSIS — I2489 Other forms of acute ischemic heart disease: Secondary | ICD-10-CM | POA: Diagnosis not present

## 2023-11-30 DIAGNOSIS — M62838 Other muscle spasm: Secondary | ICD-10-CM | POA: Diagnosis not present

## 2023-11-30 DIAGNOSIS — J9621 Acute and chronic respiratory failure with hypoxia: Secondary | ICD-10-CM | POA: Diagnosis not present

## 2023-11-30 DIAGNOSIS — F419 Anxiety disorder, unspecified: Secondary | ICD-10-CM | POA: Diagnosis not present

## 2023-11-30 DIAGNOSIS — I480 Paroxysmal atrial fibrillation: Secondary | ICD-10-CM | POA: Diagnosis not present

## 2023-11-30 DIAGNOSIS — I428 Other cardiomyopathies: Secondary | ICD-10-CM | POA: Diagnosis not present

## 2023-11-30 DIAGNOSIS — M8580 Other specified disorders of bone density and structure, unspecified site: Secondary | ICD-10-CM | POA: Diagnosis not present

## 2023-11-30 DIAGNOSIS — I712 Thoracic aortic aneurysm, without rupture, unspecified: Secondary | ICD-10-CM | POA: Diagnosis not present

## 2023-11-30 DIAGNOSIS — E785 Hyperlipidemia, unspecified: Secondary | ICD-10-CM | POA: Diagnosis not present

## 2023-11-30 DIAGNOSIS — J441 Chronic obstructive pulmonary disease with (acute) exacerbation: Secondary | ICD-10-CM | POA: Diagnosis not present

## 2023-11-30 DIAGNOSIS — I447 Left bundle-branch block, unspecified: Secondary | ICD-10-CM | POA: Diagnosis not present

## 2023-11-30 DIAGNOSIS — D509 Iron deficiency anemia, unspecified: Secondary | ICD-10-CM | POA: Diagnosis not present

## 2023-11-30 DIAGNOSIS — K219 Gastro-esophageal reflux disease without esophagitis: Secondary | ICD-10-CM | POA: Diagnosis not present

## 2023-11-30 DIAGNOSIS — J309 Allergic rhinitis, unspecified: Secondary | ICD-10-CM | POA: Diagnosis not present

## 2023-11-30 DIAGNOSIS — I2510 Atherosclerotic heart disease of native coronary artery without angina pectoris: Secondary | ICD-10-CM | POA: Diagnosis not present

## 2023-11-30 DIAGNOSIS — E43 Unspecified severe protein-calorie malnutrition: Secondary | ICD-10-CM | POA: Diagnosis not present

## 2023-11-30 DIAGNOSIS — Z7951 Long term (current) use of inhaled steroids: Secondary | ICD-10-CM | POA: Diagnosis not present

## 2023-11-30 DIAGNOSIS — F32A Depression, unspecified: Secondary | ICD-10-CM | POA: Diagnosis not present

## 2023-11-30 DIAGNOSIS — I110 Hypertensive heart disease with heart failure: Secondary | ICD-10-CM | POA: Diagnosis not present

## 2023-11-30 DIAGNOSIS — I5042 Chronic combined systolic (congestive) and diastolic (congestive) heart failure: Secondary | ICD-10-CM | POA: Diagnosis not present

## 2023-11-30 DIAGNOSIS — I2721 Secondary pulmonary arterial hypertension: Secondary | ICD-10-CM | POA: Diagnosis not present

## 2023-11-30 DIAGNOSIS — H34212 Partial retinal artery occlusion, left eye: Secondary | ICD-10-CM | POA: Diagnosis not present

## 2023-11-30 DIAGNOSIS — J439 Emphysema, unspecified: Secondary | ICD-10-CM | POA: Diagnosis not present

## 2023-11-30 DIAGNOSIS — Z7901 Long term (current) use of anticoagulants: Secondary | ICD-10-CM | POA: Diagnosis not present

## 2023-12-01 DIAGNOSIS — I712 Thoracic aortic aneurysm, without rupture, unspecified: Secondary | ICD-10-CM | POA: Diagnosis not present

## 2023-12-01 DIAGNOSIS — I5042 Chronic combined systolic (congestive) and diastolic (congestive) heart failure: Secondary | ICD-10-CM | POA: Diagnosis not present

## 2023-12-01 DIAGNOSIS — H34212 Partial retinal artery occlusion, left eye: Secondary | ICD-10-CM | POA: Diagnosis not present

## 2023-12-01 DIAGNOSIS — M8580 Other specified disorders of bone density and structure, unspecified site: Secondary | ICD-10-CM | POA: Diagnosis not present

## 2023-12-01 DIAGNOSIS — I110 Hypertensive heart disease with heart failure: Secondary | ICD-10-CM | POA: Diagnosis not present

## 2023-12-01 DIAGNOSIS — E785 Hyperlipidemia, unspecified: Secondary | ICD-10-CM | POA: Diagnosis not present

## 2023-12-01 DIAGNOSIS — I081 Rheumatic disorders of both mitral and tricuspid valves: Secondary | ICD-10-CM | POA: Diagnosis not present

## 2023-12-01 DIAGNOSIS — F32A Depression, unspecified: Secondary | ICD-10-CM | POA: Diagnosis not present

## 2023-12-01 DIAGNOSIS — I2489 Other forms of acute ischemic heart disease: Secondary | ICD-10-CM | POA: Diagnosis not present

## 2023-12-01 DIAGNOSIS — I480 Paroxysmal atrial fibrillation: Secondary | ICD-10-CM | POA: Diagnosis not present

## 2023-12-01 DIAGNOSIS — Z7901 Long term (current) use of anticoagulants: Secondary | ICD-10-CM | POA: Diagnosis not present

## 2023-12-01 DIAGNOSIS — M62838 Other muscle spasm: Secondary | ICD-10-CM | POA: Diagnosis not present

## 2023-12-01 DIAGNOSIS — Z7951 Long term (current) use of inhaled steroids: Secondary | ICD-10-CM | POA: Diagnosis not present

## 2023-12-01 DIAGNOSIS — I447 Left bundle-branch block, unspecified: Secondary | ICD-10-CM | POA: Diagnosis not present

## 2023-12-01 DIAGNOSIS — K219 Gastro-esophageal reflux disease without esophagitis: Secondary | ICD-10-CM | POA: Diagnosis not present

## 2023-12-01 DIAGNOSIS — J441 Chronic obstructive pulmonary disease with (acute) exacerbation: Secondary | ICD-10-CM | POA: Diagnosis not present

## 2023-12-01 DIAGNOSIS — I428 Other cardiomyopathies: Secondary | ICD-10-CM | POA: Diagnosis not present

## 2023-12-01 DIAGNOSIS — J439 Emphysema, unspecified: Secondary | ICD-10-CM | POA: Diagnosis not present

## 2023-12-01 DIAGNOSIS — D509 Iron deficiency anemia, unspecified: Secondary | ICD-10-CM | POA: Diagnosis not present

## 2023-12-01 DIAGNOSIS — F419 Anxiety disorder, unspecified: Secondary | ICD-10-CM | POA: Diagnosis not present

## 2023-12-01 DIAGNOSIS — J309 Allergic rhinitis, unspecified: Secondary | ICD-10-CM | POA: Diagnosis not present

## 2023-12-01 DIAGNOSIS — J9621 Acute and chronic respiratory failure with hypoxia: Secondary | ICD-10-CM | POA: Diagnosis not present

## 2023-12-01 DIAGNOSIS — E43 Unspecified severe protein-calorie malnutrition: Secondary | ICD-10-CM | POA: Diagnosis not present

## 2023-12-01 DIAGNOSIS — I2510 Atherosclerotic heart disease of native coronary artery without angina pectoris: Secondary | ICD-10-CM | POA: Diagnosis not present

## 2023-12-01 DIAGNOSIS — I2721 Secondary pulmonary arterial hypertension: Secondary | ICD-10-CM | POA: Diagnosis not present

## 2023-12-02 DIAGNOSIS — J449 Chronic obstructive pulmonary disease, unspecified: Secondary | ICD-10-CM | POA: Diagnosis not present

## 2023-12-02 DIAGNOSIS — I5020 Unspecified systolic (congestive) heart failure: Secondary | ICD-10-CM | POA: Diagnosis not present

## 2023-12-02 DIAGNOSIS — M810 Age-related osteoporosis without current pathological fracture: Secondary | ICD-10-CM | POA: Diagnosis not present

## 2023-12-04 DIAGNOSIS — J449 Chronic obstructive pulmonary disease, unspecified: Secondary | ICD-10-CM | POA: Diagnosis not present

## 2023-12-07 DIAGNOSIS — M810 Age-related osteoporosis without current pathological fracture: Secondary | ICD-10-CM | POA: Diagnosis not present

## 2023-12-09 DIAGNOSIS — I5020 Unspecified systolic (congestive) heart failure: Secondary | ICD-10-CM | POA: Diagnosis not present

## 2023-12-09 NOTE — Telephone Encounter (Signed)
 Called and left a detailed message for pt to follow up with cardiologist for refills ( Lasix  and potassium cl)

## 2023-12-13 ENCOUNTER — Encounter: Payer: Self-pay | Admitting: Oncology

## 2023-12-13 DIAGNOSIS — I110 Hypertensive heart disease with heart failure: Secondary | ICD-10-CM | POA: Diagnosis not present

## 2023-12-13 DIAGNOSIS — I2721 Secondary pulmonary arterial hypertension: Secondary | ICD-10-CM | POA: Diagnosis not present

## 2023-12-13 DIAGNOSIS — I2489 Other forms of acute ischemic heart disease: Secondary | ICD-10-CM | POA: Diagnosis not present

## 2023-12-13 DIAGNOSIS — H34212 Partial retinal artery occlusion, left eye: Secondary | ICD-10-CM | POA: Diagnosis not present

## 2023-12-13 DIAGNOSIS — Z7901 Long term (current) use of anticoagulants: Secondary | ICD-10-CM | POA: Diagnosis not present

## 2023-12-13 DIAGNOSIS — F32A Depression, unspecified: Secondary | ICD-10-CM | POA: Diagnosis not present

## 2023-12-13 DIAGNOSIS — E785 Hyperlipidemia, unspecified: Secondary | ICD-10-CM | POA: Diagnosis not present

## 2023-12-13 DIAGNOSIS — I5042 Chronic combined systolic (congestive) and diastolic (congestive) heart failure: Secondary | ICD-10-CM | POA: Diagnosis not present

## 2023-12-13 DIAGNOSIS — J439 Emphysema, unspecified: Secondary | ICD-10-CM | POA: Diagnosis not present

## 2023-12-13 DIAGNOSIS — I712 Thoracic aortic aneurysm, without rupture, unspecified: Secondary | ICD-10-CM | POA: Diagnosis not present

## 2023-12-13 DIAGNOSIS — E43 Unspecified severe protein-calorie malnutrition: Secondary | ICD-10-CM | POA: Diagnosis not present

## 2023-12-13 DIAGNOSIS — J309 Allergic rhinitis, unspecified: Secondary | ICD-10-CM | POA: Diagnosis not present

## 2023-12-13 DIAGNOSIS — D509 Iron deficiency anemia, unspecified: Secondary | ICD-10-CM | POA: Diagnosis not present

## 2023-12-13 DIAGNOSIS — M62838 Other muscle spasm: Secondary | ICD-10-CM | POA: Diagnosis not present

## 2023-12-13 DIAGNOSIS — Z7951 Long term (current) use of inhaled steroids: Secondary | ICD-10-CM | POA: Diagnosis not present

## 2023-12-13 DIAGNOSIS — I2510 Atherosclerotic heart disease of native coronary artery without angina pectoris: Secondary | ICD-10-CM | POA: Diagnosis not present

## 2023-12-13 DIAGNOSIS — J9621 Acute and chronic respiratory failure with hypoxia: Secondary | ICD-10-CM | POA: Diagnosis not present

## 2023-12-13 DIAGNOSIS — J441 Chronic obstructive pulmonary disease with (acute) exacerbation: Secondary | ICD-10-CM | POA: Diagnosis not present

## 2023-12-13 DIAGNOSIS — K219 Gastro-esophageal reflux disease without esophagitis: Secondary | ICD-10-CM | POA: Diagnosis not present

## 2023-12-13 DIAGNOSIS — M8580 Other specified disorders of bone density and structure, unspecified site: Secondary | ICD-10-CM | POA: Diagnosis not present

## 2023-12-13 DIAGNOSIS — I081 Rheumatic disorders of both mitral and tricuspid valves: Secondary | ICD-10-CM | POA: Diagnosis not present

## 2023-12-13 DIAGNOSIS — I428 Other cardiomyopathies: Secondary | ICD-10-CM | POA: Diagnosis not present

## 2023-12-13 DIAGNOSIS — F419 Anxiety disorder, unspecified: Secondary | ICD-10-CM | POA: Diagnosis not present

## 2023-12-13 DIAGNOSIS — I480 Paroxysmal atrial fibrillation: Secondary | ICD-10-CM | POA: Diagnosis not present

## 2023-12-13 DIAGNOSIS — I447 Left bundle-branch block, unspecified: Secondary | ICD-10-CM | POA: Diagnosis not present

## 2023-12-13 NOTE — Telephone Encounter (Signed)
 Called to confirm appt for 5/22

## 2023-12-14 ENCOUNTER — Ambulatory Visit: Admitting: Hematology and Oncology

## 2023-12-14 ENCOUNTER — Other Ambulatory Visit

## 2023-12-14 DIAGNOSIS — J309 Allergic rhinitis, unspecified: Secondary | ICD-10-CM | POA: Diagnosis not present

## 2023-12-14 DIAGNOSIS — I2721 Secondary pulmonary arterial hypertension: Secondary | ICD-10-CM | POA: Diagnosis not present

## 2023-12-14 DIAGNOSIS — I2489 Other forms of acute ischemic heart disease: Secondary | ICD-10-CM | POA: Diagnosis not present

## 2023-12-14 DIAGNOSIS — J439 Emphysema, unspecified: Secondary | ICD-10-CM | POA: Diagnosis not present

## 2023-12-14 DIAGNOSIS — K219 Gastro-esophageal reflux disease without esophagitis: Secondary | ICD-10-CM | POA: Diagnosis not present

## 2023-12-14 DIAGNOSIS — Z7951 Long term (current) use of inhaled steroids: Secondary | ICD-10-CM | POA: Diagnosis not present

## 2023-12-14 DIAGNOSIS — I5042 Chronic combined systolic (congestive) and diastolic (congestive) heart failure: Secondary | ICD-10-CM | POA: Diagnosis not present

## 2023-12-14 DIAGNOSIS — I2510 Atherosclerotic heart disease of native coronary artery without angina pectoris: Secondary | ICD-10-CM | POA: Diagnosis not present

## 2023-12-14 DIAGNOSIS — I081 Rheumatic disorders of both mitral and tricuspid valves: Secondary | ICD-10-CM | POA: Diagnosis not present

## 2023-12-14 DIAGNOSIS — F419 Anxiety disorder, unspecified: Secondary | ICD-10-CM | POA: Diagnosis not present

## 2023-12-14 DIAGNOSIS — J441 Chronic obstructive pulmonary disease with (acute) exacerbation: Secondary | ICD-10-CM | POA: Diagnosis not present

## 2023-12-14 DIAGNOSIS — I447 Left bundle-branch block, unspecified: Secondary | ICD-10-CM | POA: Diagnosis not present

## 2023-12-14 DIAGNOSIS — I712 Thoracic aortic aneurysm, without rupture, unspecified: Secondary | ICD-10-CM | POA: Diagnosis not present

## 2023-12-14 DIAGNOSIS — M62838 Other muscle spasm: Secondary | ICD-10-CM | POA: Diagnosis not present

## 2023-12-14 DIAGNOSIS — I428 Other cardiomyopathies: Secondary | ICD-10-CM | POA: Diagnosis not present

## 2023-12-14 DIAGNOSIS — F32A Depression, unspecified: Secondary | ICD-10-CM | POA: Diagnosis not present

## 2023-12-14 DIAGNOSIS — I480 Paroxysmal atrial fibrillation: Secondary | ICD-10-CM | POA: Diagnosis not present

## 2023-12-14 DIAGNOSIS — E785 Hyperlipidemia, unspecified: Secondary | ICD-10-CM | POA: Diagnosis not present

## 2023-12-14 DIAGNOSIS — J9621 Acute and chronic respiratory failure with hypoxia: Secondary | ICD-10-CM | POA: Diagnosis not present

## 2023-12-14 DIAGNOSIS — M8580 Other specified disorders of bone density and structure, unspecified site: Secondary | ICD-10-CM | POA: Diagnosis not present

## 2023-12-14 DIAGNOSIS — I110 Hypertensive heart disease with heart failure: Secondary | ICD-10-CM | POA: Diagnosis not present

## 2023-12-14 DIAGNOSIS — Z7901 Long term (current) use of anticoagulants: Secondary | ICD-10-CM | POA: Diagnosis not present

## 2023-12-14 DIAGNOSIS — H34212 Partial retinal artery occlusion, left eye: Secondary | ICD-10-CM | POA: Diagnosis not present

## 2023-12-14 DIAGNOSIS — D509 Iron deficiency anemia, unspecified: Secondary | ICD-10-CM | POA: Diagnosis not present

## 2023-12-14 DIAGNOSIS — E43 Unspecified severe protein-calorie malnutrition: Secondary | ICD-10-CM | POA: Diagnosis not present

## 2023-12-15 NOTE — Progress Notes (Signed)
 Cardiology Office Note:    Date:  12/27/2023   ID:  Dana Pruitt, DOB Nov 05, 1940, MRN [id number]  PCP:  Clarice Nottingham, MD  Cardiologist:  Dorn Lesches, MD     Referring MD: Clarice Nottingham, MD   Chief Complaint: follow-up of CHF and atrial fibrillation  History of Present Illness:    Dana Pruitt is a 83 y.o. female with a history of mild irregularities but no significant CAD noted on recent cardiac catheterization on 08/31/2023, severe aortic insufficiency s/p AVR in 04/2019, chronic systolic CHF/non-ischemic cardiomyopathy with EF of 35-40% on Echo in 10/2023, paroxysmal atrial fibrillation on Eliquis , mild mitral regurgitation,ascending thoracic aortic aneurysm,  stroke in 12/2021, COPD with chronic hypoxic respiratory failure on 5L of O2, hypertension, hyperlipidemia, CKD stage III, GERD, anxiety/depression, and breast cancer s/p chemotherapy/radiation and lumpectomy in 2013  who is followed by Dr. Lesches who presents today for routine follow-up of CHF and atrial fibrillation.  Patient was referred to Dr. Lesches in 2015 after Echo showed LVEF of 35-40% with moderate aortic insufficiency. She was started on beta-blocker for her cardiomyopathy and EF eventually normalized to to 60-65% on repeat Echo in 06/2016. Aortic disease progressed and Echo in 09/2018 showed LVEF of 40-45 with diffuse hypokinesis, grade 1 diastolic dysfunction, and severe AI.She was admitted in 12/2018 with chest pain and shortness of breath. She underwent R/LHC at that time which showed mild non-obstructive CAD, wide aortic pulse pressure suggestive of significant AI, preserved cardiac output, and low filling pressures. Patient ultimately underwent AVR with 21mm Inspiris Edwards valve by Dr. Fleeta Ochoa on 01/31/2019. She did develop post-op atrial fibrillation but converted back to sinus rhythm after initiation of Amiodarone . She was discharge on Amiodarone  and Coumadin  both of which were able to be discontinued later. However,  she had recurrent episodes of atrial fibrillation and was started on Eliquis . Coronary CTA in 12/2021 for further evaluation of dyspnea on exertion and an episode of left arm pain showed a coronary calcium  score of 709 (87th percentile for age and sex) with mild (25-49%) plaque in the RCA, LAD, and LCX (FFR negative) as well as as a 4.2 cm ascending aortic aneurysm (stable from prior imaging). CTA also showed signs of pulmonary emphysema and a new 6mm spiculated pulmonary nodule in the anterior right upper lobe for which she was advised to follow-up with Pulmonology.   At visit in 07/2023, she reported increasing shortness of breath over the last 6 months. Echo showed LVEF of 30-35% with global hypokinesis but prominent septal-lateral dyssynchrony due to LBBB, mild LVH, and grade 1 diastolic dysfunction as well as mildly reduce RV function, mild MR, stable bioprosthetic aortic valve, and mild ascending aorta dilatation measuring 43mm. She has since had multiple hospitalizations.   She was admitted in 08/30/2023 to 09/02/2023 for chest pain that woke her up from sleep and radiated down her right neck, shoulder, and to her abdomen. High-sensitivity troponin was negative x2. However, she underwent a R/LHC which showed patent coronary arteries with only mild irregularities and essentially normal intracardiac hemodynamics. On the morning of 3/12, she went into rapid atrial fibrillation with rates as high as the 120s to 130s. She became hypotensive with this and had a syncopal episode while on the bedside commode. No ventricular arrhythmias or pauses were seen on telemetry at this time. She was started on IV Amiodarone  and given IV fluids. She converted back to sinus rhythm with this and BP improved. She was transitioned to PO Amiodarone  and  started on Bisoprolol  (previously on Diltiazem  but this was stopped due to decreased EF).   She was then admitted from 09/13/2023 to 09/16/2023 for acute on chronic hypoxic  respiratory failure and sepsis secondary to influenza A and superimposed bacterial pneumonia as well as a COPD exacerbation. She was treated with Tamiflu , antibiotics, steroids, and breathing treatments.   She was readmitted in 10/2023 for acute on chronic hypoxic respiratory failure secondary to COPD exacerbation after presenting with worsening shortness of breath and cough. Repeat limited Echo showed LVEF of 35-40% with global hypokinesis, mild MR, mild to moderate TR, and mild dilatation of the ascending aorta measuring 44 mm. She was treated with antibiotics and steroids.   Patient presents today for follow-up. Here with a friends. Friend states something is not right today. She has had a lot of trouble wording findings this morning as well as confusion. No other acute stroke like symptoms. She does have a history of a stroke and looks like presenting symptoms at that time were confusion, memory loss, and lightheadedness/ dizziness. She denies any current lightheadedness/ dizziness. She does have some confusion. When asked what year it was she said 11. She was able to tell me that we celebrated Independence Day last week but was not able to tell me the date of that holiday. Unclear exactly when symptoms started but it sounds like last known normal was last night. Friend does state the word finding issues have gotten worse since she picked her up around 10:40am.  Overall, she sounds stable from a cardiac standpoint. She has chronic dyspnea due to her COPD and is on 5 L of O2 24/7 at home. She thinks this may be slightly worse but overall stable. No orthopnea, PND, edema. No chest pain. No palpitations. She had an episode of lightheadedness/ dizziness about 1 month ago that occurred after bending over and then standing up. She said she almost passed out with this but denies any overt syncope. No recurrence since then.   EKGs/Labs/Other Studies Reviewed:    The following studies were  reviewed:  Complete Echocardiogram 08/26/2023: Impressions: 1. Left ventricular ejection fraction, by estimation, is 30 to 35%. The  left ventricle has moderately decreased function. The left ventricle  demonstrates global hypokinesis with prominent septal-lateral dyssynchrony  due to LBBB. There is mild concentric   left ventricular hypertrophy. Left ventricular diastolic parameters are  consistent with Grade I diastolic dysfunction (impaired relaxation).   2. Right ventricular systolic function is mildly reduced. The right  ventricular size is normal. There is normal pulmonary artery systolic  pressure. The estimated right ventricular systolic pressure is 35.9 mmHg.   3. Left atrial size was mildly dilated.   4. The mitral valve is normal in structure. Mild mitral valve  regurgitation. No evidence of mitral stenosis.   5. Bioprosthetic aortic valve, normal function. Mean gradient 12 mmHg, no  perivalvular leakage.   6. Aortic dilatation noted. There is mild dilatation of the ascending  aorta, measuring 43 mm.   7. The inferior vena cava is normal in size with greater than 50%  respiratory variability, suggesting right atrial pressure of 3 mmHg.  _______________   Right/ Left Cardiac Catheterization 08/31/2023: 1.  Patent coronary arteries with mild irregularities but no stenotic lesions.  Mild ectasia in the proximal RCA and mid circumflex. 2.  Essentially normal intracardiac hemodynamics with a right atrial mean pressure of 4 mmHg, PA pressure of 33/14 mean 21 mmHg, pulmonary wedge pressure of 5 mmHg, and preserved cardiac  output of 3.7 L/min   Plan: med Rx per inpatient cardiology team. OK to resume apixaban  tomorrow am (orders written).    Diagnostic Dominance: Right  _______________  Limited Echocardiogram 10/25/2023: Impressions:  1. Left ventricular ejection fraction, by estimation, is 35 to 40%. The  left ventricle has moderately decreased function. The left ventricle   demonstrates global hypokinesis.   2. The mitral valve is normal in structure. Mild mitral valve  regurgitation.   3. Tricuspid valve regurgitation is mild to moderate.   4. The aortic valve has been repaired/replaced. Aortic valve  regurgitation is not visualized. Aortic valve sclerosis is present, with  no evidence of aortic valve stenosis. There is a 21 mm a bioprosthetic  valve present in the aortic position. Procedure  Date: 04/26/19. Aortic valve mean gradient measures 10.0 mmHg.   5. Aortic dilatation noted. There is mild dilatation of the ascending  aorta, measuring 44 mm.   6. There is normal pulmonary artery systolic pressure.   7. The inferior vena cava is normal in size with greater than 50%  respiratory variability, suggesting right atrial pressure of 3 mmHg.    EKG:  EKG not ordered today.   Recent Labs: 08/16/2023: Pro B Natriuretic peptide (BNP) 473.0 09/02/2023: TSH 2.008 09/14/2023: Magnesium  1.7 10/22/2023: ALT 13 10/23/2023: B Natriuretic Peptide 928.4 10/25/2023: BUN 33; Creatinine, Ser 1.14; Potassium 3.7; Sodium 139 11/11/2023: Hemoglobin 9.7; Platelets 156  Recent Lipid Panel    Component Value Date/Time   CHOL 118 08/31/2023 0506   CHOL 197 02/16/2014 0826   TRIG 118 08/31/2023 0506   HDL 47 08/31/2023 0506   HDL 52 02/16/2014 0826   CHOLHDL 2.5 08/31/2023 0506   VLDL 24 08/31/2023 0506   LDLCALC 47 08/31/2023 0506   LDLCALC 127 (H) 02/16/2014 0826    Physical Exam:    Vital Signs: BP (!) 92/52   Pulse 71   LMP  (LMP Unknown)   SpO2 (!) 85%     Wt Readings from Last 3 Encounters:  11/11/23 120 lb 4.8 oz (54.6 kg)  11/05/23 120 lb 11.2 oz (54.7 kg)  11/02/23 120 lb 2.4 oz (54.5 kg)     General: 83 y.o. frail Caucasian female in no acute distress. On 5 L of O2 via nasal cannula. HEENT: Normocephalic and atraumatic. Sclera clear.  Neck: Supple. No JVD. Heart: RRR. No murmurs, gallops, or rubs.  Lungs: No increased work of breathing. Decreased  breaths sounds bilaterally but no wheezes, rhonchi, or rales.  Extremities: No lower extremity edema.   Skin: Warm and dry. Neuro: Mild confusion with trouble word finding. No gross neuro deficits. Psych: Normal affect. Responds appropriately.   Assessment:    1. Altered mental status, unspecified altered mental status type   2. Word finding difficulty   3. Coronary artery disease involving native coronary artery of native heart without angina pectoris   4. Chronic HFrEF (heart failure with reduced ejection fraction) (HCC)   5. Non-ischemic cardiomyopathy (HCC)   6. Paroxysmal atrial fibrillation (HCC)   7. History of syncope   8. Severe aortic insufficiency s/p AVR   9. Mild mitral regurgitation   10. Tricuspid valve insufficiency, unspecified etiology   11. Aneurysm of ascending aorta without rupture (HCC)   12. Hyperlipidemia, unspecified hyperlipidemia type   13. Stage 3a chronic kidney disease (HCC)   14. Chronic obstructive pulmonary disease, unspecified COPD type (HCC)   15. Chronic hypoxic respiratory failure (HCC)     Plan:    Altered  Mental Status Word finding Difficulty Patient reports word finding issues and confusion this morning. She does have a history of a stroke in 2023 and presenting symptoms at that time were confusion and memory loss. Unclear exactly when symptoms started but it sound like last known normal was likely last night. No gross neuro deficits on exam. Patient needs to go to the ED for stroke work-up. She declined EMS. We are located right across the street from the ED. Her friend said she would take her to the ED immediately. Called and spoke with triage staff in ED and made them aware that she is coming for stroke work-up. Discussed patient with DOD (Dr. Elmira) who agreed.  Mild Non-Obstructive CAD Patient has a history intermittent chest pain. She has had multiple ischemic work-ups with cardiac catheterizations and coronary CTAs in the past which  have been unremarkable. Most  recent LHC  in 08/2023  during and admission for chest pain showed mild irregularities but no significant CAD.  - No chest pain.  - No aspirin  due to need for full anticoagulation.  - Continue statin/ Zetia .    Chronic HFrEF Non-Ischemic Cardiomyopathy Most recent Echo in 10/2023 showed LVEF of 35-40% with global hypokinesis. - She has chronic dyspnea on exertion due to COPD but euvolemic on exam.   - GDMT limited by hypotension. - Continue Losartan  25mg  daily.  - Continue Bisoprolol  2.5mg  daily.  - Unclear cause of her cardiomyopathy. Possibly secondary to dyssynchrony from LBBB. She may benefit from referral to [facility] given GDMT is limited by hypotension. Did not have time to discuss this given stroke like symptoms in the office. Patient sent to the ED.  Paroxysmal Atrial Fibrillation Maintaining sinus rhythm on exam.  - Continue Amiodarone  200mg  daily.  - Continue Bisoprolol  2.5mg  daily.  - Continue Eliquis  2.5 mg twice daily (reduced dose due to age and weight).   History of Syncope She had a syncopal episode during admission in 08/2023 which occurred in the setting of rapid atrial fibrillation and hypotension. No significant arrhythmias were noted on telemetry at the time.  - No recurrent syncope. She does described any episode of what sounds like orthostatic hypotension with near syncope about 1 months ago. - Recommended changing positions slowly, wearing compressions stockings, and avoiding dehydration.   Severe Aortic Insufficiency s/p AVR Mild Mitral Regurgitation Mild to Moderate Tricuspid Regurgitation  History of AVR in 2020. Echo in 10/2023  showed stable bioprosthetic aortic vave with normal function and no AS/ AI (mean gradient 10 mmHg) as well as mild MR and mild to moderate TR.  - Continue SBE prophylaxis.  - Continue routine monitoring with serial Echos.    Ascending Thoracic Aortic Aneurysm  Recent chest CTA in 10/2023 showed  ascending thoracic aortic aneurysm measuring 4.5cm. - Semi-annual imaging recommended per guidelines. Did not have time to discuss today given stroke like symptoms. Patient send tot the ED.  Hyperlipidemia Lipid panel in 08/2023: Total Cholesterol 118, Triglycerides 118, HDL 47, LDL 47. LDL goal <70. - Continue Lipitor 20mg  daily and Zetia  10mg  daily.    CKD Stage IIIa  Baseline creatinine is around 1.0 to 1.2  COPD  Chronic Hypoxic Respiratory Failure On 5L of O2 at home. - O2 sat 85% in the office today on home O2. She does not look to be in any respiratory distress. - She is being sent to the ED for stroke work-up as above.  In summary, patient sounds stable from a cardiac standpoint but presents  to the office with new word finding difficulty and confusion concerning for possible stroke. We did not go in to detail about any of her chronic cardiac conditions due to concern for possible stroke. She was sent to the ED.   Disposition: Patient sent to the ED. Will arrange follow-up visit in a couple of weeks after this.   Signed, Aline FORBES Door, PA-C  12/27/2023 12:48 PM    [facility]

## 2023-12-16 ENCOUNTER — Telehealth: Payer: Self-pay

## 2023-12-16 DIAGNOSIS — I5020 Unspecified systolic (congestive) heart failure: Secondary | ICD-10-CM | POA: Diagnosis not present

## 2023-12-16 NOTE — Telephone Encounter (Signed)
 Patient notified office that she had recent visit with her PCP, Dr. Clarice, and that he had not received the results of her recent biopsy.  Patient aware that the results have been re-faxed through Epic for the provider's review.  Patient voiced appreciation for the call.

## 2023-12-21 ENCOUNTER — Other Ambulatory Visit (HOSPITAL_BASED_OUTPATIENT_CLINIC_OR_DEPARTMENT_OTHER): Payer: Self-pay

## 2023-12-21 DIAGNOSIS — I712 Thoracic aortic aneurysm, without rupture, unspecified: Secondary | ICD-10-CM | POA: Diagnosis not present

## 2023-12-21 DIAGNOSIS — H34212 Partial retinal artery occlusion, left eye: Secondary | ICD-10-CM | POA: Diagnosis not present

## 2023-12-21 DIAGNOSIS — J309 Allergic rhinitis, unspecified: Secondary | ICD-10-CM | POA: Diagnosis not present

## 2023-12-21 DIAGNOSIS — I447 Left bundle-branch block, unspecified: Secondary | ICD-10-CM | POA: Diagnosis not present

## 2023-12-21 DIAGNOSIS — I2489 Other forms of acute ischemic heart disease: Secondary | ICD-10-CM | POA: Diagnosis not present

## 2023-12-21 DIAGNOSIS — E43 Unspecified severe protein-calorie malnutrition: Secondary | ICD-10-CM | POA: Diagnosis not present

## 2023-12-21 DIAGNOSIS — F419 Anxiety disorder, unspecified: Secondary | ICD-10-CM | POA: Diagnosis not present

## 2023-12-21 DIAGNOSIS — I110 Hypertensive heart disease with heart failure: Secondary | ICD-10-CM | POA: Diagnosis not present

## 2023-12-21 DIAGNOSIS — J441 Chronic obstructive pulmonary disease with (acute) exacerbation: Secondary | ICD-10-CM | POA: Diagnosis not present

## 2023-12-21 DIAGNOSIS — I2510 Atherosclerotic heart disease of native coronary artery without angina pectoris: Secondary | ICD-10-CM | POA: Diagnosis not present

## 2023-12-21 DIAGNOSIS — M62838 Other muscle spasm: Secondary | ICD-10-CM | POA: Diagnosis not present

## 2023-12-21 DIAGNOSIS — I428 Other cardiomyopathies: Secondary | ICD-10-CM | POA: Diagnosis not present

## 2023-12-21 DIAGNOSIS — J439 Emphysema, unspecified: Secondary | ICD-10-CM | POA: Diagnosis not present

## 2023-12-21 DIAGNOSIS — I2721 Secondary pulmonary arterial hypertension: Secondary | ICD-10-CM | POA: Diagnosis not present

## 2023-12-21 DIAGNOSIS — J9621 Acute and chronic respiratory failure with hypoxia: Secondary | ICD-10-CM | POA: Diagnosis not present

## 2023-12-21 DIAGNOSIS — Z7951 Long term (current) use of inhaled steroids: Secondary | ICD-10-CM | POA: Diagnosis not present

## 2023-12-21 DIAGNOSIS — F32A Depression, unspecified: Secondary | ICD-10-CM | POA: Diagnosis not present

## 2023-12-21 DIAGNOSIS — E785 Hyperlipidemia, unspecified: Secondary | ICD-10-CM | POA: Diagnosis not present

## 2023-12-21 DIAGNOSIS — Z7901 Long term (current) use of anticoagulants: Secondary | ICD-10-CM | POA: Diagnosis not present

## 2023-12-21 DIAGNOSIS — D509 Iron deficiency anemia, unspecified: Secondary | ICD-10-CM | POA: Diagnosis not present

## 2023-12-21 DIAGNOSIS — I480 Paroxysmal atrial fibrillation: Secondary | ICD-10-CM | POA: Diagnosis not present

## 2023-12-21 DIAGNOSIS — M8580 Other specified disorders of bone density and structure, unspecified site: Secondary | ICD-10-CM | POA: Diagnosis not present

## 2023-12-21 DIAGNOSIS — I5042 Chronic combined systolic (congestive) and diastolic (congestive) heart failure: Secondary | ICD-10-CM | POA: Diagnosis not present

## 2023-12-21 DIAGNOSIS — K219 Gastro-esophageal reflux disease without esophagitis: Secondary | ICD-10-CM | POA: Diagnosis not present

## 2023-12-21 DIAGNOSIS — I081 Rheumatic disorders of both mitral and tricuspid valves: Secondary | ICD-10-CM | POA: Diagnosis not present

## 2023-12-21 MED ORDER — ALBUTEROL SULFATE HFA 108 (90 BASE) MCG/ACT IN AERS: 2.0000 | INHALATION_SPRAY | Freq: Four times a day (QID) | RESPIRATORY_TRACT | 3 refills | Status: DC | PRN

## 2023-12-22 DIAGNOSIS — R3589 Other polyuria: Secondary | ICD-10-CM | POA: Diagnosis not present

## 2023-12-27 ENCOUNTER — Emergency Department (HOSPITAL_COMMUNITY)

## 2023-12-27 ENCOUNTER — Other Ambulatory Visit: Payer: Self-pay

## 2023-12-27 ENCOUNTER — Observation Stay (HOSPITAL_COMMUNITY)
Admission: EM | Admit: 2023-12-27 | Discharge: 2023-12-28 | Disposition: A | Discharge status: Home / Self Care | Attending: Internal Medicine | Admitting: Internal Medicine

## 2023-12-27 ENCOUNTER — Ambulatory Visit: Attending: Student | Admitting: Student

## 2023-12-27 ENCOUNTER — Encounter (HOSPITAL_COMMUNITY): Payer: Self-pay

## 2023-12-27 VITALS — BP 92/52 | HR 71

## 2023-12-27 DIAGNOSIS — F32A Depression, unspecified: Secondary | ICD-10-CM | POA: Diagnosis not present

## 2023-12-27 DIAGNOSIS — R29701 NIHSS score 1: Secondary | ICD-10-CM | POA: Diagnosis not present

## 2023-12-27 DIAGNOSIS — I480 Paroxysmal atrial fibrillation: Secondary | ICD-10-CM | POA: Diagnosis not present

## 2023-12-27 DIAGNOSIS — R4182 Altered mental status, unspecified: Secondary | ICD-10-CM | POA: Diagnosis not present

## 2023-12-27 DIAGNOSIS — I359 Nonrheumatic aortic valve disorder, unspecified: Secondary | ICD-10-CM | POA: Diagnosis not present

## 2023-12-27 DIAGNOSIS — R2990 Unspecified symptoms and signs involving the nervous system: Principal | ICD-10-CM

## 2023-12-27 DIAGNOSIS — D649 Anemia, unspecified: Secondary | ICD-10-CM | POA: Insufficient documentation

## 2023-12-27 DIAGNOSIS — R4789 Other speech disturbances: Secondary | ICD-10-CM | POA: Diagnosis not present

## 2023-12-27 DIAGNOSIS — I071 Rheumatic tricuspid insufficiency: Secondary | ICD-10-CM

## 2023-12-27 DIAGNOSIS — E785 Hyperlipidemia, unspecified: Secondary | ICD-10-CM

## 2023-12-27 DIAGNOSIS — J449 Chronic obstructive pulmonary disease, unspecified: Secondary | ICD-10-CM

## 2023-12-27 DIAGNOSIS — Z79899 Other long term (current) drug therapy: Secondary | ICD-10-CM | POA: Diagnosis not present

## 2023-12-27 DIAGNOSIS — I6350 Cerebral infarction due to unspecified occlusion or stenosis of unspecified cerebral artery: Secondary | ICD-10-CM | POA: Diagnosis not present

## 2023-12-27 DIAGNOSIS — I6381 Other cerebral infarction due to occlusion or stenosis of small artery: Principal | ICD-10-CM | POA: Insufficient documentation

## 2023-12-27 DIAGNOSIS — I639 Cerebral infarction, unspecified: Secondary | ICD-10-CM

## 2023-12-27 DIAGNOSIS — I5022 Chronic systolic (congestive) heart failure: Secondary | ICD-10-CM

## 2023-12-27 DIAGNOSIS — I6782 Cerebral ischemia: Secondary | ICD-10-CM | POA: Diagnosis not present

## 2023-12-27 DIAGNOSIS — I2510 Atherosclerotic heart disease of native coronary artery without angina pectoris: Secondary | ICD-10-CM

## 2023-12-27 DIAGNOSIS — F419 Anxiety disorder, unspecified: Secondary | ICD-10-CM | POA: Insufficient documentation

## 2023-12-27 DIAGNOSIS — Z7901 Long term (current) use of anticoagulants: Secondary | ICD-10-CM

## 2023-12-27 DIAGNOSIS — I428 Other cardiomyopathies: Secondary | ICD-10-CM

## 2023-12-27 DIAGNOSIS — Z8673 Personal history of transient ischemic attack (TIA), and cerebral infarction without residual deficits: Secondary | ICD-10-CM

## 2023-12-27 DIAGNOSIS — I130 Hypertensive heart and chronic kidney disease with heart failure and stage 1 through stage 4 chronic kidney disease, or unspecified chronic kidney disease: Secondary | ICD-10-CM | POA: Diagnosis not present

## 2023-12-27 DIAGNOSIS — R4701 Aphasia: Secondary | ICD-10-CM | POA: Diagnosis not present

## 2023-12-27 DIAGNOSIS — I7121 Aneurysm of the ascending aorta, without rupture: Secondary | ICD-10-CM

## 2023-12-27 DIAGNOSIS — R9089 Other abnormal findings on diagnostic imaging of central nervous system: Secondary | ICD-10-CM | POA: Diagnosis not present

## 2023-12-27 DIAGNOSIS — I5020 Unspecified systolic (congestive) heart failure: Secondary | ICD-10-CM

## 2023-12-27 DIAGNOSIS — N1831 Chronic kidney disease, stage 3a: Secondary | ICD-10-CM

## 2023-12-27 DIAGNOSIS — I700 Atherosclerosis of aorta: Secondary | ICD-10-CM | POA: Diagnosis not present

## 2023-12-27 DIAGNOSIS — I110 Hypertensive heart disease with heart failure: Secondary | ICD-10-CM | POA: Diagnosis not present

## 2023-12-27 DIAGNOSIS — I63522 Cerebral infarction due to unspecified occlusion or stenosis of left anterior cerebral artery: Secondary | ICD-10-CM | POA: Diagnosis not present

## 2023-12-27 DIAGNOSIS — I10 Essential (primary) hypertension: Secondary | ICD-10-CM

## 2023-12-27 DIAGNOSIS — N183 Chronic kidney disease, stage 3 unspecified: Secondary | ICD-10-CM

## 2023-12-27 DIAGNOSIS — E782 Mixed hyperlipidemia: Secondary | ICD-10-CM

## 2023-12-27 DIAGNOSIS — I351 Nonrheumatic aortic (valve) insufficiency: Secondary | ICD-10-CM

## 2023-12-27 DIAGNOSIS — C50411 Malignant neoplasm of upper-outer quadrant of right female breast: Secondary | ICD-10-CM | POA: Diagnosis not present

## 2023-12-27 DIAGNOSIS — D638 Anemia in other chronic diseases classified elsewhere: Secondary | ICD-10-CM | POA: Diagnosis present

## 2023-12-27 DIAGNOSIS — J9611 Chronic respiratory failure with hypoxia: Secondary | ICD-10-CM

## 2023-12-27 DIAGNOSIS — Z87898 Personal history of other specified conditions: Secondary | ICD-10-CM

## 2023-12-27 DIAGNOSIS — I340 Nonrheumatic mitral (valve) insufficiency: Secondary | ICD-10-CM

## 2023-12-27 LAB — CBC
HCT: 32.8 % — ABNORMAL LOW (ref 36.0–46.0)
Hemoglobin: 10.2 g/dL — ABNORMAL LOW (ref 12.0–15.0)
MCH: 30.5 pg (ref 26.0–34.0)
MCHC: 31.1 g/dL (ref 30.0–36.0)
MCV: 98.2 fL (ref 80.0–100.0)
Platelets: 226 K/uL (ref 150–400)
RBC: 3.34 MIL/uL — ABNORMAL LOW (ref 3.87–5.11)
RDW: 15.1 % (ref 11.5–15.5)
WBC: 6.7 K/uL (ref 4.0–10.5)
nRBC: 0 % (ref 0.0–0.2)

## 2023-12-27 LAB — COMPREHENSIVE METABOLIC PANEL WITH GFR
ALT: 18 U/L (ref 0–44)
AST: 21 U/L (ref 15–41)
Albumin: 3.8 g/dL (ref 3.5–5.0)
Alkaline Phosphatase: 38 U/L (ref 38–126)
Anion gap: 11 (ref 5–15)
BUN: 17 mg/dL (ref 8–23)
CO2: 27 mmol/L (ref 22–32)
Calcium: 9.7 mg/dL (ref 8.9–10.3)
Chloride: 102 mmol/L (ref 98–111)
Creatinine, Ser: 1.33 mg/dL — ABNORMAL HIGH (ref 0.44–1.00)
GFR, Estimated: 40 mL/min — ABNORMAL LOW (ref >60)
Glucose, Bld: 133 mg/dL — ABNORMAL HIGH (ref 70–99)
Potassium: 4.1 mmol/L (ref 3.5–5.1)
Sodium: 140 mmol/L (ref 135–145)
Total Bilirubin: 0.7 mg/dL (ref 0.0–1.2)
Total Protein: 6.2 g/dL — ABNORMAL LOW (ref 6.5–8.1)

## 2023-12-27 LAB — I-STAT CHEM 8, ED
BUN: 20 mg/dL (ref 8–23)
Calcium, Ion: 1.18 mmol/L (ref 1.15–1.40)
Chloride: 104 mmol/L (ref 98–111)
Creatinine, Ser: 1.4 mg/dL — ABNORMAL HIGH (ref 0.44–1.00)
Glucose, Bld: 130 mg/dL — ABNORMAL HIGH (ref 70–99)
HCT: 29 % — ABNORMAL LOW (ref 36.0–46.0)
Hemoglobin: 9.9 g/dL — ABNORMAL LOW (ref 12.0–15.0)
Potassium: 4.1 mmol/L (ref 3.5–5.1)
Sodium: 137 mmol/L (ref 135–145)
TCO2: 28 mmol/L (ref 22–32)

## 2023-12-27 LAB — DIFFERENTIAL
Abs Immature Granulocytes: 0.06 K/uL (ref 0.00–0.07)
Basophils Absolute: 0.1 K/uL (ref 0.0–0.1)
Basophils Relative: 1 %
Eosinophils Absolute: 0.2 K/uL (ref 0.0–0.5)
Eosinophils Relative: 3 %
Immature Granulocytes: 1 %
Lymphocytes Relative: 9 %
Lymphs Abs: 0.6 K/uL — ABNORMAL LOW (ref 0.7–4.0)
Monocytes Absolute: 0.6 K/uL (ref 0.1–1.0)
Monocytes Relative: 8 %
Neutro Abs: 5.3 K/uL (ref 1.7–7.7)
Neutrophils Relative %: 78 %

## 2023-12-27 LAB — APTT: aPTT: 31 s (ref 24–36)

## 2023-12-27 LAB — PROTIME-INR
INR: 1.4 — ABNORMAL HIGH (ref 0.8–1.2)
Prothrombin Time: 17.6 s — ABNORMAL HIGH (ref 11.4–15.2)

## 2023-12-27 LAB — ETHANOL: Alcohol, Ethyl (B): <15 mg/dL (ref <15)

## 2023-12-27 LAB — CBG MONITORING, ED: Glucose-Capillary: 124 mg/dL — ABNORMAL HIGH (ref 70–99)

## 2023-12-27 MED ORDER — ALBUTEROL SULFATE (2.5 MG/3ML) 0.083% IN NEBU: 2.5000 mg | INHALATION_SOLUTION | Freq: Four times a day (QID) | RESPIRATORY_TRACT | Status: DC | PRN

## 2023-12-27 MED ORDER — BUDESON-GLYCOPYRROL-FORMOTEROL 160-9-4.8 MCG/ACT IN AERO
2.0000 | INHALATION_SPRAY | Freq: Two times a day (BID) | RESPIRATORY_TRACT | Status: DC
  Filled 2023-12-27: qty 5.9

## 2023-12-27 MED ORDER — ACETAMINOPHEN 325 MG PO TABS: 650.0000 mg | ORAL_TABLET | ORAL | Status: DC | PRN

## 2023-12-27 MED ORDER — PANTOPRAZOLE SODIUM 40 MG PO TBEC
40.0000 mg | DELAYED_RELEASE_TABLET | Freq: Every day | ORAL | Status: DC
  Administered 2023-12-28: 40 mg via ORAL
  Filled 2023-12-27: qty 1

## 2023-12-27 MED ORDER — ACETAMINOPHEN 650 MG RE SUPP: 650.0000 mg | RECTAL | Status: DC | PRN

## 2023-12-27 MED ORDER — AMIODARONE HCL 200 MG PO TABS
200.0000 mg | ORAL_TABLET | Freq: Every day | ORAL | Status: DC
  Administered 2023-12-27 – 2023-12-28 (×2): 200 mg via ORAL
  Filled 2023-12-27 (×2): qty 1

## 2023-12-27 MED ORDER — SENNOSIDES-DOCUSATE SODIUM 8.6-50 MG PO TABS: 1.0000 | ORAL_TABLET | Freq: Every evening | ORAL | Status: DC | PRN

## 2023-12-27 MED ORDER — STROKE: EARLY STAGES OF RECOVERY BOOK
Freq: Once | Status: AC
  Filled 2023-12-27: qty 1

## 2023-12-27 MED ORDER — BUDESON-GLYCOPYRROL-FORMOTEROL 160-9-4.8 MCG/ACT IN AERO
2.0000 | INHALATION_SPRAY | Freq: Two times a day (BID) | RESPIRATORY_TRACT | Status: DC
  Administered 2023-12-27 – 2023-12-28 (×2): 2 via RESPIRATORY_TRACT
  Filled 2023-12-27: qty 5.9

## 2023-12-27 MED ORDER — LORAZEPAM 2 MG/ML IJ SOLN
0.5000 mg | Freq: Once | INTRAMUSCULAR | Status: AC
  Administered 2023-12-27: 0.5 mg via INTRAVENOUS
  Filled 2023-12-27: qty 1

## 2023-12-27 MED ORDER — SERTRALINE HCL 100 MG PO TABS
200.0000 mg | ORAL_TABLET | Freq: Every day | ORAL | Status: DC
  Administered 2023-12-28: 200 mg via ORAL
  Filled 2023-12-27: qty 2

## 2023-12-27 MED ORDER — EZETIMIBE 10 MG PO TABS
10.0000 mg | ORAL_TABLET | Freq: Every day | ORAL | Status: DC
  Administered 2023-12-28: 10 mg via ORAL
  Filled 2023-12-27: qty 1

## 2023-12-27 MED ORDER — ACETAMINOPHEN 160 MG/5ML PO SOLN: 650.0000 mg | ORAL | Status: DC | PRN

## 2023-12-27 MED ORDER — ATORVASTATIN CALCIUM 40 MG PO TABS
20.0000 mg | ORAL_TABLET | Freq: Every day | ORAL | Status: DC
  Administered 2023-12-28: 20 mg via ORAL
  Filled 2023-12-27: qty 1

## 2023-12-27 NOTE — ED Provider Notes (Signed)
 [place] [facility]GENC[facility] DEPARTMENT AT [facility] Provider Note   CSN: [id number] Arrival date & time: 12/27/23  1243  An emergency department physician performed an initial assessment on this suspected stroke patient at 1243.  Patient presents with: Shortness of Breath, Altered Mental Status, and Code Stroke   Dana Pruitt is a 83 y.o. female.   See ED course for HPI   Shortness of Breath Altered Mental Status      Prior to Admission medications   Medication Sig Start Date End Date Taking? Authorizing Provider  albuterol  (PROVENTIL ) (2.5 MG/3ML) 0.083% nebulizer solution Take 3 mLs (2.5 mg total) by nebulization every 6 (six) hours as needed for wheezing or shortness of breath. 08/16/23  Yes Cobb, Comer GAILS, NP  albuterol  (VENTOLIN  HFA) 108 (90 Base) MCG/ACT inhaler Inhale 2 puffs into the lungs every 6 (six) hours as needed for wheezing or shortness of breath (4 times daily). INHALE 2 PUFFS BY MOUTH EVERY 4 HOURS AS NEEDED ONLY IF YOU CAN'T CATCH YOUR BREATH 12/21/23  Yes Cobb, Comer GAILS, NP  amiodarone  (PACERONE ) 200 MG tablet Take 1 tablet (200 mg total) by mouth daily. 09/02/23  Yes Parcells, Waddell LABOR, PA-C  apixaban  (ELIQUIS ) 2.5 MG TABS tablet Take 1 tablet (2.5 mg total) by mouth 2 (two) times daily. 10/11/23  Yes Court Dorn PARAS, MD  atorvastatin  (LIPITOR) 20 MG tablet Take 20 mg by mouth daily.  02/12/14  Yes [provider]  bisoprolol  (ZEBETA ) 5 MG tablet Take 0.5 tablets (2.5 mg total) by mouth daily. 09/03/23  Yes Parcells, Waddell LABOR, PA-C  BREZTRI  AEROSPHERE 160-9-4.8 MCG/ACT AERO INHALE 2 PUFFS INTO THE LUNGS TWICE DAILY 02/02/23  Yes Wert, Michael B, MD  carvedilol  (COREG ) 12.5 MG tablet Take 12.5 mg by mouth 2 (two) times daily with a meal. 11/29/23  Yes [provider]  ezetimibe  (ZETIA ) 10 MG tablet Take 10 mg by mouth daily.   Yes [provider]  losartan  (COZAAR ) 25 MG tablet Take 25 mg by mouth daily.   Yes [provider]  sertraline  (ZOLOFT ) 100 MG tablet Take 200 mg by mouth daily.   Yes [provider]  Cholecalciferol  (VITAMIN D3) 25 MCG (1000 UT) CAPS Take 1,000 Units by mouth daily. Patient not taking: Reported on 12/27/2023 02/10/23   [provider]  FEROSUL 325 (65 Fe) MG tablet Take 325 mg by mouth every other day. 05/08/23   [provider]  fluticasone  (FLONASE ) 50 MCG/ACT nasal spray SHAKE LIQUID AND USE 2 SPRAYS IN EACH NOSTRIL DAILY 11/29/23   Jude Harden GAILS, MD  furosemide  (LASIX ) 20 MG tablet Take 20 mg by mouth daily. 12/02/23   [provider]  guaiFENesin  (MUCINEX ) 600 MG 12 hr tablet Take 1 tablet (600 mg total) by mouth 2 (two) times daily. 02/07/19   Gold, Wayne E, PA-C  guaiFENesin -dextromethorphan  (ROBITUSSIN DM) 100-10 MG/5ML syrup Take 5 mLs by mouth every 4 (four) hours as needed for cough. Patient not taking: Reported on 12/27/2023 09/16/23   Christobal Guadalajara, MD  ipratropium (ATROVENT ) 0.06 % nasal spray Place 2 sprays into both nostrils 4 (four) times daily. 08/16/23   Cobb, Comer GAILS, NP  OXYGEN  Inhale 5 L/min into the lungs continuous.    [provider]  pantoprazole  (PROTONIX ) 40 MG tablet Take 40 mg by mouth daily. 11/25/23   [provider]  potassium chloride  (KLOR-CON  M) 10 MEQ tablet Take 10 mEq by mouth daily. 12/02/23   [provider]  Allergies: Patient has no known allergies.    Review of Systems  Respiratory:  Positive for shortness of breath.     Updated Vital Signs BP 129/69 (BP Location: Right Arm)   Pulse 64   Temp 97.8 F (36.6 C) (Oral)   Resp 19   Ht 5' 4 (1.626 m)   Wt 53.5 kg   LMP  (LMP Unknown)   SpO2 94%   BMI 20.25 kg/m   Physical Exam Vitals and nursing note reviewed.  Constitutional:      General: She is not in acute distress.    Appearance: She is not toxic-appearing.  Cardiovascular:     Rate and Rhythm: Normal rate and regular rhythm.  Pulmonary:     Effort: Pulmonary effort is  normal.     Breath sounds: Normal breath sounds.  Musculoskeletal:     Right lower leg: No edema.     Left lower leg: No edema.  Skin:    General: Skin is warm.  Neurological:     General: No focal deficit present.     Mental Status: She is alert and oriented to person, place, and time.     Cranial Nerves: No cranial nerve deficit.     Motor: No weakness.     Comments: Is having some aphasia/word finding difficulty     (all labs ordered are listed, but only abnormal results are displayed) Labs Reviewed  PROTIME-INR - Abnormal; Notable for the following components:      Result Value   Prothrombin Time 17.6 (*)    INR 1.4 (*)    All other components within normal limits  CBC - Abnormal; Notable for the following components:   RBC 3.34 (*)    Hemoglobin 10.2 (*)    HCT 32.8 (*)    All other components within normal limits  DIFFERENTIAL - Abnormal; Notable for the following components:   Lymphs Abs 0.6 (*)    All other components within normal limits  COMPREHENSIVE METABOLIC PANEL WITH GFR - Abnormal; Notable for the following components:   Glucose, Bld 133 (*)    Creatinine, Ser 1.33 (*)    Total Protein 6.2 (*)    GFR, Estimated 40 (*)    All other components within normal limits  I-STAT CHEM 8, ED - Abnormal; Notable for the following components:   Creatinine, Ser 1.40 (*)    Glucose, Bld 130 (*)    Hemoglobin 9.9 (*)    HCT 29.0 (*)    All other components within normal limits  CBG MONITORING, ED - Abnormal; Notable for the following components:   Glucose-Capillary 124 (*)    All other components within normal limits  ETHANOL  APTT  RAPID URINE DRUG SCREEN, HOSP PERFORMED    EKG: None  Radiology: DG Chest Portable 1 View Result Date: 12/27/2023 CLINICAL DATA:  Chronic obstructive pulmonary disease. EXAM: PORTABLE CHEST 1 VIEW COMPARISON:  Radiographs 10/24/2023 and 10/23/2023. Chest CT 10/21/2023. FINDINGS: 1345 hours. The heart size and mediastinal contours are  stable status post median sternotomy and aortic valve replacement. There is aortic atherosclerosis. Patchy airspace disease seen at the left lung base on the most recent study has cleared in the interval. The lungs are mildly hyperinflated with scattered mild scarring. No new airspace disease, pleural effusion or pneumothorax. The bones appear unchanged. IMPRESSION: Interval clearing of left basilar airspace disease. No acute cardiopulmonary process. Electronically Signed   By: Elsie Perone M.D.   On: 12/27/2023 14:01   CT HEAD  CODE STROKE WO CONTRAST Addendum Date: 12/27/2023 ADDENDUM REPORT: 12/27/2023 13:43 ADDENDUM: Impression #1 called by telephone at the time of interpretation on 12/27/2023 at 1:42 pm to provider Dr. Jerrie, who verbally acknowledged these results. Electronically Signed   By: Rockey Childs D.O.   On: 12/27/2023 13:43   Result Date: 12/27/2023 CLINICAL DATA:  Code stroke. Provided history: Neuro deficit, acute, stroke suspected. Aphasia. EXAM: CT HEAD WITHOUT CONTRAST TECHNIQUE: Contiguous axial images were obtained from the base of the skull through the vertex without intravenous contrast. RADIATION DOSE REDUCTION: This exam was performed according to the departmental dose-optimization program which includes automated exposure control, adjustment of the mA and/or kV according to patient size and/or use of iterative reconstruction technique. COMPARISON:  Non-contrast head CT and CT angiogram head/neck 12/20/2021. Brain MRI 12/20/2021. FINDINGS: Brain: Generalized cerebral atrophy. Acute or subacute cortical/subcortical infarct within the mid left frontal lobe (MCA territory), new from the prior head CT of 12/20/2021. The infarct spans 3.4 cm. A known chronic cortical infarct within the left occipital lobe was better appreciated on the prior brain MRI of 12/20/2021 (acute at that time). Background moderate patchy and ill-defined hypoattenuation within the cerebral white matter, nonspecific  but compatible chronic small vessel ischemic disease. There is no acute intracranial hemorrhage. No extra-axial fluid collection. No evidence of an intracranial mass. No midline shift. Vascular: No hyperdense vessel.  Atherosclerotic calcifications. Skull: No calvarial fracture or aggressive osseous lesion. Sinuses/Orbits: No mass or acute finding within the imaged orbits. No significant paranasal sinus disease at the imaged levels. ASPECTS (Alberta Stroke Program Early CT Score) - Ganglionic level infarction (caudate, lentiform nuclei, internal capsule, insula, M1-M3 cortex): 2 - Supraganglionic infarction (M4-M6 cortex): 6 Total score (0-10 with 10 being normal): 8 Attempts are being made to reach the ordering provider at this time. IMPRESSION: 1. 3.4 cm acute or subacute cortical/subcortical infarct within the mid left frontal lobe (MCA vascular territory), new from the prior head CT of 12/20/2021. ASPECTS is 8. 2. Background parenchymal atrophy, chronic small vessel ischemic disease and chronic left occipital lobe infarct, as described. Electronically Signed: By: Rockey Childs D.O. On: 12/27/2023 13:39     Procedures   Medications Ordered in the ED   stroke: early stages of recovery book (has no administration in time range)  amiodarone  (PACERONE ) tablet 200 mg (has no administration in time range)  atorvastatin  (LIPITOR) tablet 20 mg (has no administration in time range)  ezetimibe  (ZETIA ) tablet 10 mg (has no administration in time range)  sertraline  (ZOLOFT ) tablet 200 mg (has no administration in time range)  pantoprazole  (PROTONIX ) EC tablet 40 mg (has no administration in time range)  budesonide -glycopyrrolate -formoterol  (BREZTRI ) 160-9-4.8 MCG/ACT inhaler 2 puff (has no administration in time range)  albuterol  (PROVENTIL ) (2.5 MG/3ML) 0.083% nebulizer solution 2.5 mg (has no administration in time range)  acetaminophen  (TYLENOL ) tablet 650 mg (has no administration in time range)    Or   acetaminophen  (TYLENOL ) 160 MG/5ML solution 650 mg (has no administration in time range)    Or  acetaminophen  (TYLENOL ) suppository 650 mg (has no administration in time range)  senna-docusate (Senokot-S) tablet 1 tablet (has no administration in time range)  LORazepam  (ATIVAN ) injection 0.5 mg (0.5 mg Intravenous Given 12/27/23 1354)    Clinical Course as of 12/27/23 1549  Mon Dec 27, 2023  1329 Patient sent by cardiologist for acute onset confusion and word finding/aphasia.  Last known normal 11 AM.  Rating code stroke.  No headache, vision changes, facial droop,  unilateral weakness, chest pain, shortness of breath, abdominal pain.  She is on chronic oxygen  therapy and 5 L reports no change in her baseline dyspnea.  I do not appreciate unilateral weakness or deficits.  She is able to communicate, but does seemingly have some thought blocking and aphasia/word finding difficulty.  No dysarthria. [TY]  1331 CT HEAD CODE STROKE WO CONTRAST I do not appreciate obvious intracranial hemorrhage on my review. [TY]    Clinical Course User Index [TY] Neysa Caron PARAS, DO                                 Medical Decision Making 83 year old female presenting emergency department for evaluation of possible stroke with aphasia.  No other focal deficits.  Maintaining oxygen  saturation on room air.  I did activate her as a code stroke.  CT head negative.  Neurology recommending admission for stroke workup.  Labs with mild anemia, no significant metabolic derangements to explain her symptoms, mildly elevated creatinine.  MRI/MRA pending.  Case discussed with neurology and with hospitalist.  Hospitalist agrees to see admit patient.  Amount and/or Complexity of Data Reviewed Independent Historian:     Details: Family member notes abrupt onset of 1 and that her speech is not normal External Data Reviewed:     Details: Not on a blood thinner Labs: ordered. Radiology: ordered. Decision-making details documented  in ED Course.  Risk Decision regarding hospitalization.       Final diagnoses:  Stroke-like symptoms    ED Discharge Orders     None          Neysa Caron PARAS, DO 12/27/23 1549

## 2023-12-27 NOTE — ED Triage Notes (Addendum)
 Pt to er from her doctors office, per charge RN doctors office called to say that pt has some confusion and a hx of encelopathy, pt states that she is here because she was having a hard time finding her words and was confused, pt on 5L via [place], pt has some pursed lip breathing; pt states that her last known well was 1045, pt direct bedded, md at bedside

## 2023-12-27 NOTE — ED Notes (Signed)
 Patient walking in the hallway with PT.

## 2023-12-27 NOTE — ED Notes (Signed)
 Patient transported to MRI

## 2023-12-27 NOTE — Evaluation (Addendum)
 Occupational Therapy Evaluation Patient Details Name: Dana Pruitt MRN: [id number] DOB: [birth year] Today's Date: 12/27/2023   History of Present Illness   83 y.o. female admitted 12/27/23 with CVA.  Recent admits 10/21/23, 08/30/23 and 09/13/23 with flu, PNA. Other PMH includes COPD, CHF, CAD, HTN, HLD, PAF, s/p AVR, CA, breast CA.  CT Head:3.4 cm acute or subacute cortical/subcortical infarct within the  mid left frontal lobe (MCA vascular territory).     Clinical Impressions Patient admitted for the diagnosis above.  PTA she lives at home with her spouse, who she provides supportive assist for.  Patient has a 4WRW that she uses occasionally, and remains Mod I with ADL,iADL, and community mobility.  She does use O2 at baseline, ha a home concentrator with a long hose in the home.  Patient presents with continued mild work finding difficulties and is unsteady walking the halls without an ADL.  In addition, SOB is noted with increased activity.  Currently she is needing up to CGA to supervision for in room mobility/toileting and ADL completion from a sit to stand level.  OT will continue efforts in the acute setting to address deficits, but no post acute OT is anticipated.       If plan is discharge home, recommend the following:   Assist for transportation     Functional Status Assessment   Patient has had a recent decline in their functional status and demonstrates the ability to make significant improvements in function in a reasonable and predictable amount of time.     Equipment Recommendations   None recommended by OT     Recommendations for Other Services         Precautions/Restrictions   Precautions Precautions: Fall;Other (comment) Precaution/Restrictions Comments: Watch O2 sat Restrictions Weight Bearing Restrictions Per Provider Order: No     Mobility Bed Mobility Overal bed mobility: Modified Independent               Patient Response:  Cooperative  Transfers Overall transfer level: Needs assistance Equipment used: None Transfers: Bed to chair/wheelchair/BSC, Sit to/from Stand Sit to Stand: Supervision     Step pivot transfers: Contact guard assist, Supervision     General transfer comment: unsteady wiithout AD      Balance Overall balance assessment: Needs assistance Sitting-balance support: Feet supported Sitting balance-Leahy Scale: Good     Standing balance support: No upper extremity supported Standing balance-Leahy Scale: Fair Standing balance comment: side steps and mild weaving noted with in hall mobility.                           ADL either performed or assessed with clinical judgement   ADL       Grooming: Wash/dry hands;Supervision/safety;Standing               Lower Body Dressing: Contact guard assist;Sit to/from stand   Toilet Transfer: Supervision/safety;Contact guard Optician, dispensing                   Vision Patient Visual Report: No change from baseline       Perception Perception: Within Functional Limits       Praxis Praxis: WFL       Pertinent Vitals/Pain Pain Assessment Pain Assessment: No/denies pain     Extremity/Trunk Assessment Upper Extremity Assessment Upper Extremity Assessment: Overall WFL for tasks assessed   Lower Extremity Assessment Lower Extremity Assessment: Defer to PT evaluation   Cervical / Trunk Assessment  Cervical / Trunk Assessment: Normal   Communication Communication Communication: Impaired Factors Affecting Communication: Difficulty expressing self   Cognition Arousal: Alert Behavior During Therapy: WFL for tasks assessed/performed Cognition: No apparent impairments                               Following commands: Intact       Cueing  General Comments   Cueing Techniques: Verbal cues   DOE with activity. 5L O2   Exercises     Shoulder Instructions      Home Living  Family/patient expects to be discharged to:: Private residence Living Arrangements: Spouse/significant other Available Help at Discharge: Family;Available PRN/intermittently Type of Home: Other(Comment) (Condo) Home Access: Stairs to enter Entrance Stairs-Number of Steps: 2 Entrance Stairs-Rails: Can reach both Home Layout: Two level;Able to live on main level with bedroom/bathroom;Bed/bath upstairs Alternate Level Stairs-Number of Steps: stair lift recently installed   [employer] Shower/Tub: Producer, television/film/video: Standard Bathroom Accessibility: Yes How Accessible: Accessible via walker Home Equipment: Grab bars - tub/shower;Grab bars - toilet;Shower seat - built Charity fundraiser (2 wheels);Rollator (4 wheels);Other (comment)   Additional Comments: wears 5L O2 [place] baseline. 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  Spouse with recent fall and hip fracture.      Prior Functioning/Environment Prior Level of Function : Independent/Modified Independent;Driving             Mobility Comments: typically Ind without AD,  reports intermittent use of RW and was working with HHPT.  HHPT had started having her use rollator ADLs Comments: Ind with ADLs, IADLs; occasional help for cleaning, orders groceries online    OT Problem List: Decreased activity tolerance;Impaired balance (sitting and/or standing)   OT Treatment/Interventions: Self-care/ADL training;Therapeutic activities;Balance training;DME and/or AE instruction      OT Goals(Current goals can be found in the care plan section)   Acute Rehab OT Goals Patient Stated Goal: Hoping to return home OT Goal Formulation: With patient Time For Goal Achievement: 01/10/24 Potential to Achieve Goals: Good ADL Goals Pt Will Perform Grooming: with modified independence;standing Pt Will Perform Lower Body Dressing: with modified independence;sit to/from stand Pt Will Transfer to Toilet:  with modified independence;ambulating;regular height toilet   OT Frequency:  Min 2X/week    Co-evaluation              AM-PAC OT 6 Clicks Daily Activity     Outcome Measure Help from another person eating meals?: None Help from another person taking care of personal grooming?: A Little Help from another person toileting, which includes using toliet, bedpan, or urinal?: A Little Help from another person bathing (including washing, rinsing, drying)?: A Little Help from another person to put on and taking off regular upper body clothing?: None Help from another person to put on and taking off regular lower body clothing?: A Little 6 Click Score: 20   End of Session Equipment Utilized During Treatment: Gait belt;Oxygen  Nurse Communication: Mobility status  Activity Tolerance: Patient tolerated treatment well Patient left: in bed;with call bell/phone within reach  OT Visit Diagnosis: Unsteadiness on feet (R26.81)                Time: 8497-8476 OT Time Calculation (min): 21 min Charges:  OT General Charges $OT Visit: 1 Visit OT Evaluation $OT Eval Moderate Complexity: 1 Mod  12/27/2023  RP, OTR/L  Acute Rehabilitation Services  Office:  [phone]   Charlie BIRCH Wynnie Pacetti 12/27/2023, 3:30 PM

## 2023-12-27 NOTE — ED Notes (Signed)
 Md at bedside, pt placed on 6L via [place], states that she is on 5 at home

## 2023-12-27 NOTE — Evaluation (Signed)
 Physical Therapy Evaluation Patient Details Name: Dana Pruitt MRN: [id number] DOB: 11-24-1940 Today's Date: 12/27/2023  History of Present Illness  83 y.o. female admitted 12/27/23 with CVA.  Recent admits 10/21/23, 08/30/23 and 09/13/23 with flu, PNA. Other PMH includes COPD, CHF, CAD, HTN, HLD, PAF, s/p AVR, CA, breast CA.  CT Head:3.4 cm acute or subacute cortical/subcortical infarct within the  mid left frontal lobe (MCA vascular territory).  Clinical Impression  Pt presents with admitting diagnosis above. Pt today was able to ambulate in hallway and perform DGI. Pt scored 13/24 on DGI indicating that she is a high falls risk. PTA pt reports that she was mostly independent however had recently started HHPT who had instructed pt to begin using rollator. Recommend pt resume HHPT upon DC. PT will continue to follow.         If plan is discharge home, recommend the following: A little help with walking and/or transfers;A little help with bathing/dressing/bathroom;Assistance with cooking/housework;Assist for transportation;Help with stairs or ramp for entrance   Can travel by private vehicle        Equipment Recommendations None recommended by PT  Recommendations for Other Services       Functional Status Assessment Patient has had a recent decline in their functional status and demonstrates the ability to make significant improvements in function in a reasonable and predictable amount of time.     Precautions / Restrictions Precautions Precautions: Fall;Other (comment) Precaution/Restrictions Comments: Watch O2 sat Restrictions Weight Bearing Restrictions Per Provider Order: No      Mobility  Bed Mobility Overal bed mobility: Modified Independent                  Transfers Overall transfer level: Needs assistance Equipment used: None Transfers: Sit to/from Stand Sit to Stand: Supervision           General transfer comment: unsteady wiithout AD. Supervision for  safety    Ambulation/Gait Ambulation/Gait assistance: Contact guard assist Gait Distance (Feet): 300 Feet Assistive device: None Gait Pattern/deviations: Drifts right/left, Decreased stride length, Step-through pattern Gait velocity: decreased     General Gait Details: Pt declined rollator. Pt able to perform DGI however was a bit unsteady.  Stairs Stairs: Yes Stairs assistance: Supervision Stair Management: Two rails, Alternating pattern, Forwards Number of Stairs: 2 General stair comments: Part of DGI.  Wheelchair Mobility     Tilt Bed    Modified Rankin (Stroke Patients Only)       Balance Overall balance assessment: Needs assistance Sitting-balance support: Feet supported Sitting balance-Leahy Scale: Good     Standing balance support: No upper extremity supported Standing balance-Leahy Scale: Fair Standing balance comment: side steps and mild weaving noted with in hall mobility.                 Standardized Balance Assessment Standardized Balance Assessment : Dynamic Gait Index   Dynamic Gait Index Level Surface: Mild Impairment Change in Gait Speed: Moderate Impairment Gait with Horizontal Head Turns: Mild Impairment Gait with Vertical Head Turns: Mild Impairment Gait and Pivot Turn: Mild Impairment Step Over Obstacle: Moderate Impairment Step Around Obstacles: Moderate Impairment Steps: Normal Total Score: 14       Pertinent Vitals/Pain Pain Assessment Pain Assessment: No/denies pain    Home Living Family/patient expects to be discharged to:: Private residence Living Arrangements: Spouse/significant other Available Help at Discharge: Family;Available PRN/intermittently Type of Home: Other(Comment) (Condo) Home Access: Stairs to enter Entrance Stairs-Rails: Can reach both Entrance Stairs-Number of Steps: 2  Alternate Level Stairs-Number of Steps: stair lift recently installed Home Layout: Two level;Able to live on main level with  bedroom/bathroom;Bed/bath upstairs Home Equipment: Grab bars - tub/shower;Grab bars - toilet;Shower seat - built Char[employer] fundraiser (2 wheels);Rollator (4 wheels);Other (comment) Additional Comments: wears 5L O2 [place] baseline. 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  Spouse with recent fall and hip fracture.    Prior Function Prior Level of Function : Independent/Modified Independent;Driving             Mobility Comments: typically Ind without AD,  reports intermittent use of RW and was working with HHPT.  HHPT had started having her use rollator ADLs Comments: Ind with ADLs, IADLs; occasional help for cleaning, orders groceries online     Extremity/Trunk Assessment   Upper Extremity Assessment Upper Extremity Assessment: Overall WFL for tasks assessed    Lower Extremity Assessment Lower Extremity Assessment: Overall WFL for tasks assessed    Cervical / Trunk Assessment Cervical / Trunk Assessment: Normal  Communication   Communication Communication: Impaired Factors Affecting Communication: Difficulty expressing self    Cognition Arousal: Alert Behavior During Therapy: WFL for tasks assessed/performed   PT - Cognitive impairments: No apparent impairments                         Following commands: Intact       Cueing Cueing Techniques: Verbal cues     General Comments General comments (skin integrity, edema, etc.): VSS on 4L    Exercises     Assessment/Plan    PT Assessment Patient needs continued PT services  PT Problem List Decreased strength;Decreased range of motion;Decreased activity tolerance;Decreased balance;Decreased mobility;Decreased coordination;Decreased knowledge of use of DME;Decreased safety awareness;Decreased knowledge of precautions;Cardiopulmonary status limiting activity       PT Treatment Interventions DME instruction;Stair training;Gait training;Functional mobility training;Therapeutic  activities;Therapeutic exercise;Balance training;Neuromuscular re-education;Patient/family education    PT Goals (Current goals can be found in the Care Plan section)  Acute Rehab PT Goals Patient Stated Goal: to go home PT Goal Formulation: With patient Time For Goal Achievement: 01/10/24 Potential to Achieve Goals: Good    Frequency Min 3X/week     Co-evaluation               AM-PAC PT 6 Clicks Mobility  Outcome Measure Help needed turning from your back to your side while in a flat bed without using bedrails?: None Help needed moving from lying on your back to sitting on the side of a flat bed without using bedrails?: None Help needed moving to and from a bed to a chair (including a wheelchair)?: None Help needed standing up from a chair using your arms (e.g., wheelchair or bedside chair)?: None Help needed to walk in hospital room?: A Little Help needed climbing 3-5 steps with a railing? : A Little 6 Click Score: 22    End of Session Equipment Utilized During Treatment: Gait belt;Oxygen  Activity Tolerance: Patient tolerated treatment well Patient left: in bed;with call bell/phone within reach;with bed alarm set Nurse Communication: Mobility status PT Visit Diagnosis: Other abnormalities of gait and mobility (R26.89)    Time: 8343-8284 PT Time Calculation (min) (ACUTE ONLY): 19 min   Charges:   PT Evaluation $PT Eval Moderate Complexity: 1 Mod   PT General Charges $$ ACUTE PT VISIT: 1 Visit         Sueellen NOVAK, PT, DPT Acute Rehab Services [phone]   Kassia Demarinis  Zina 12/27/2023, 5:27 PM

## 2023-12-27 NOTE — H&P (Signed)
 History and Physical   Dana Pruitt FMW:[id number] DOB: 08-03-1940 DOA: 12/27/2023  PCP: Clarice Nottingham, MD   Patient coming from: Cardiologist's office  Chief Complaint: Aphasia  HPI: Dana Pruitt is a 83 y.o. female with medical history significant of hypertension, hyperlipidemia, CVA, paroxysmal atrial fibrillation, CAD, CHF, aortic insufficiency status post aortic valve replacement, breast cancer, depression, anxiety, COPD, chronic respiratory failure with hypoxia, anemia presenting with aphasia from cardiologist office.  Patient is unsure when symptoms onset/when she was last normal.  She was not speaking very much at home.  She went to her regular cardiology appointment today and was found to have word finding difficulty.  She was sent to the ED for further evaluation for possible stroke.  Denies fevers, chills, chest pain, shortness of breath, abdominal pain, constipation, diarrhea, nausea, vomiting.  No other deficits noted thus far.  ED Course: Vital signs in the ED notable for blood pressure in the 90s-120 systolic, respirate in the 20s.  On chronic 5 L.  Lab workup included CMP with creatinine stable 1.23, glucose 133, protein 6.2.  CBC with hemoglobin stable 10.2.  PT and INR stably elevated at 17.6 and 1.4 respectively.  UDS pending.  Ethanol level negative.  Lipid panel and A1c pending.  Chest x-ray show interval clearance of prior airspace disease but no acute abnormality.  CT head showed 3.4 cm acute versus subacute infarct.  MRI brain and MRA head, carotid ultrasound pending.  Neurology has seen the patient and recommending stroke workup.  Review of Systems: As per HPI otherwise all other systems reviewed and are negative.  Past Medical History:  Diagnosis Date   Anemia    several times over the years (10/17/2012)   Anxiety    Breast cancer (HCC) 02/2010   right breast   CAD (coronary artery disease)    a. LHC 12/2018: mild non-obstructive CAD   Carcinoma of  breast treated with adjuvant hormone therapy (HCC) 02/2010   Femara    Chronic systolic CHF    a. Echo in 2015: LVEF of 35-40%, b. Echo in 12/2021: LVEF of 60-65%   COPD (chronic obstructive pulmonary disease) (HCC)    Depression    GERD (gastroesophageal reflux disease)    diet controlled, no med   Hollenhorst plaque, left eye    apparently resolved at recent ophthalmology visit   Hyperlipidemia    Hypertension    IDC, RightStage II, 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   Moderate aortic insufficiency    Non-ischemic cardiomyopathy (HCC)    Paroxysmal atrial fibrillation (HCC)    Pneumonia    that's why I'm here (10/17/2012)   S/P AVR (aortic valve replacement) 04/26/2019   Stroke (HCC)    12/2021    Past Surgical History:  Procedure Laterality Date   AORTIC VALVE REPLACEMENT N/A 01/31/2019   Procedure: AORTIC VALVE REPLACEMENT (AVR) USING 21 MM INSPIRIS RESILIS AORTIC VALVE. SN: [id number];  Surgeon: Fleeta Ochoa, Maude, MD;  Location: [facility] OR;  Service: Open Heart Surgery;  Laterality: N/A;   APPENDECTOMY  1974   BREAST BIOPSY Right 02/27/10   Needle core Biopsy; Invasive Mammary; ER/PR Positive, Her-2 Neu negative, Ki-67 22%   BREAST LUMPECTOMY WITH SENTINEL LYMPH NODE BIOPSY Right 07/03/11   Invasive Ductal Carcinoma;0/3 nodes negative,; ER,PR Positive, Her-2 Neg; Ki-67 22%   CESAREAN SECTION  1970; 1974   COLONOSCOPY     DILATION AND CURETTAGE OF UTERUS  1970's   RIGHT HEART CATH AND CORONARY ANGIOGRAPHY N/A 08/31/2023   Procedure: RIGHT HEART CATH  AND CORONARY ANGIOGRAPHY;  Surgeon: Wonda Sharper, MD;  Location: [facility] INVASIVE CV LAB;  Service: Cardiovascular;  Laterality: N/A;   RIGHT/LEFT HEART CATH AND CORONARY ANGIOGRAPHY N/A 01/02/2019   Procedure: RIGHT/LEFT HEART CATH AND CORONARY ANGIOGRAPHY;  Surgeon: Rolan Ezra RAMAN, MD;  Location: [facility] INVASIVE CV LAB;  Service: Cardiovascular;  Laterality: N/A;   TEE WITHOUT CARDIOVERSION N/A 01/31/2019   Procedure: TRANSESOPHAGEAL ECHOCARDIOGRAM  (TEE);  Surgeon: Fleeta Ochoa, Maude, MD;  Location: [facility] OR;  Service: Open Heart Surgery;  Laterality: N/A;   TUBAL LIGATION  1974   WISDOM TOOTH EXTRACTION      Social History  reports that she quit smoking about 33 years ago. Her smoking use included cigarettes. She started smoking about 65 years ago. She has a 96.3 pack-year smoking history. She has never used smokeless tobacco. She reports that she does not currently use alcohol. She reports that she does not use drugs.  No Known Allergies  Family History  Problem Relation Age of Onset   Heart disease Mother    Cancer Father    Heart disease Maternal Grandmother    Heart disease Maternal Grandfather    Cancer Paternal Grandmother    Lung disease Neg Hx   Reviewed on admission  Prior to Admission medications   Medication Sig Start Date End Date Taking? Authorizing Provider  albuterol  (PROVENTIL ) (2.5 MG/3ML) 0.083% nebulizer solution Take 3 mLs (2.5 mg total) by nebulization every 6 (six) hours as needed for wheezing or shortness of breath. 08/16/23   Cobb, Comer GAILS, NP  albuterol  (VENTOLIN  HFA) 108 (90 Base) MCG/ACT inhaler Inhale 2 puffs into the lungs every 6 (six) hours as needed for wheezing or shortness of breath (4 times daily). INHALE 2 PUFFS BY MOUTH EVERY 4 HOURS AS NEEDED ONLY IF YOU CAN'T CATCH YOUR BREATH 12/21/23   Cobb, Comer GAILS, NP  amiodarone  (PACERONE ) 200 MG tablet Take 1 tablet (200 mg total) by mouth daily. 09/02/23   Parcells, Waddell LABOR, PA-C  apixaban  (ELIQUIS ) 2.5 MG TABS tablet Take 1 tablet (2.5 mg total) by mouth 2 (two) times daily. 10/11/23   Court Dorn PARAS, MD  atorvastatin  (LIPITOR) 20 MG tablet Take 20 mg by mouth daily.  02/12/14   [provider]  bisoprolol  (ZEBETA ) 5 MG tablet Take 0.5 tablets (2.5 mg total) by mouth daily. 09/03/23   Parcells, Waddell LABOR, PA-C  BREZTRI  AEROSPHERE 160-9-4.8 MCG/ACT AERO INHALE 2 PUFFS INTO THE LUNGS TWICE DAILY 02/02/23   Wert, Michael B, MD  carvedilol  (COREG )  12.5 MG tablet Take 12.5 mg by mouth 2 (two) times daily with a meal. 11/29/23   [provider]  Cholecalciferol  (VITAMIN D3) 25 MCG (1000 UT) CAPS Take 1,000 Units by mouth daily. Patient not taking: Reported on 12/27/2023 02/10/23   [provider]  ezetimibe  (ZETIA ) 10 MG tablet Take 10 mg by mouth daily.    [provider]  FEROSUL 325 (65 Fe) MG tablet Take 325 mg by mouth every other day. 05/08/23   [provider]  fluticasone  (FLONASE ) 50 MCG/ACT nasal spray SHAKE LIQUID AND USE 2 SPRAYS IN EACH NOSTRIL DAILY 11/29/23   Jude Harden GAILS, MD  furosemide  (LASIX ) 20 MG tablet Take 20 mg by mouth daily. 12/02/23   [provider]  guaiFENesin  (MUCINEX ) 600 MG 12 hr tablet Take 1 tablet (600 mg total) by mouth 2 (two) times daily. 02/07/19   Gold, Wayne E, PA-C  guaiFENesin -dextromethorphan  (ROBITUSSIN DM) 100-10 MG/5ML syrup Take 5 mLs by  mouth every 4 (four) hours as needed for cough. Patient not taking: Reported on 12/27/2023 09/16/23   Christobal Guadalajara, MD  ipratropium (ATROVENT ) 0.06 % nasal spray Place 2 sprays into both nostrils 4 (four) times daily. 08/16/23   Cobb, Comer GAILS, NP  losartan  (COZAAR ) 25 MG tablet Take 25 mg by mouth daily.    [provider]  OXYGEN  Inhale 5 L/min into the lungs continuous.    [provider]  pantoprazole  (PROTONIX ) 40 MG tablet Take 40 mg by mouth daily. 11/25/23   [provider]  potassium chloride  (KLOR-CON  M) 10 MEQ tablet Take 10 mEq by mouth daily. 12/02/23   [provider]  sertraline  (ZOLOFT ) 100 MG tablet Take 200 mg by mouth daily.    [provider]  valACYclovir (VALTREX) 1000 MG tablet Take 500 mg by mouth daily as needed (cold sores).    [provider]    Physical Exam: Vitals:   12/27/23 1257 12/27/23 1313 12/27/23 1315 12/27/23 1330  BP: 124/75 (!) 114/98 (!) 114/98   Pulse: 63  61 63  Resp: (!) 24  (!) 24 (!) 23  Temp: 98.2 F (36.8 C)     TempSrc:  Oral     SpO2: (!) 83%  94% 98%  Weight:      Height:        Physical Exam Constitutional:      General: She is not in acute distress.    Appearance: Normal appearance.  HENT:     Head: Normocephalic and atraumatic.     Mouth/Throat:     Mouth: Mucous membranes are moist.     Pharynx: Oropharynx is clear.  Eyes:     Extraocular Movements: Extraocular movements intact.     Pupils: Pupils are equal, round, and reactive to light.  Cardiovascular:     Rate and Rhythm: Normal rate and regular rhythm.     Pulses: Normal pulses.     Heart sounds: Normal heart sounds.  Pulmonary:     Effort: Pulmonary effort is normal. No respiratory distress.     Breath sounds: Normal breath sounds.  Abdominal:     General: Bowel sounds are normal. There is no distension.     Palpations: Abdomen is soft.     Tenderness: There is no abdominal tenderness.  Musculoskeletal:        General: No swelling or deformity.  Skin:    General: Skin is warm and dry.  Neurological:     Mental Status: Mental status is at baseline.     Comments: Mental Status: Patient is awake, alert, oriented x3 Work finding difficulty Cranial Nerves: II: Pupils equal, round, and reactive to light.   III,IV, VI: EOMI without ptosis or diploplia.  V: Facial sensation is symmetric to light touch. VII: Facial movement is symmetric.  VIII: hearing is intact to voice X: Uvula elevates symmetrically XI: Shoulder shrug is symmetric. XII: tongue is midline without atrophy or fasciculations.  Motor: good effort thorughout, at Least 5/5 bilateral UE, 5/5 bilateral lower extremitiy  Sensory: Sensation is grossly intact bilateral UEs & LEs Cerebellar: Finger-Nose intact bilalat    Labs on Admission: I have personally reviewed following labs and imaging studies  CBC: Recent Labs  Lab 12/27/23 1307 12/27/23 1321  WBC 6.7  --   NEUTROABS 5.3  --   HGB 10.2* 9.9*  HCT 32.8* 29.0*  MCV 98.2  --   PLT 226  --     Basic  Metabolic Panel: Recent  Labs  Lab 12/27/23 1307 12/27/23 1321  NA 140 137  K 4.1 4.1  CL 102 104  CO2 27  --   GLUCOSE 133* 130*  BUN 17 20  CREATININE 1.33* 1.40*  CALCIUM  9.7  --     GFR: Estimated Creatinine Clearance: 26.6 mL/min (A) (by C-G formula based on SCr of 1.4 mg/dL (H)).  Liver Function Tests: Recent Labs  Lab 12/27/23 1307  AST 21  ALT 18  ALKPHOS 38  BILITOT 0.7  PROT 6.2*  ALBUMIN  3.8    Urine analysis:    Component Value Date/Time   COLORURINE YELLOW 09/13/2023 1501   APPEARANCEUR CLEAR 09/13/2023 1501   LABSPEC 1.012 09/13/2023 1501   PHURINE 5.0 09/13/2023 1501   GLUCOSEU NEGATIVE 09/13/2023 1501   HGBUR SMALL (A) 09/13/2023 1501   BILIRUBINUR NEGATIVE 09/13/2023 1501   KETONESUR 20 (A) 09/13/2023 1501   PROTEINUR NEGATIVE 09/13/2023 1501   UROBILINOGEN 0.2 10/17/2012 1738   NITRITE NEGATIVE 09/13/2023 1501   LEUKOCYTESUR NEGATIVE 09/13/2023 1501    Radiological Exams on Admission: DG Chest Portable 1 View Result Date: 12/27/2023 CLINICAL DATA:  Chronic obstructive pulmonary disease. EXAM: PORTABLE CHEST 1 VIEW COMPARISON:  Radiographs 10/24/2023 and 10/23/2023. Chest CT 10/21/2023. FINDINGS: 1345 hours. The heart size and mediastinal contours are stable status post median sternotomy and aortic valve replacement. There is aortic atherosclerosis. Patchy airspace disease seen at the left lung base on the most recent study has cleared in the interval. The lungs are mildly hyperinflated with scattered mild scarring. No new airspace disease, pleural effusion or pneumothorax. The bones appear unchanged. IMPRESSION: Interval clearing of left basilar airspace disease. No acute cardiopulmonary process. Electronically Signed   By: Elsie Perone M.D.   On: 12/27/2023 14:01   CT HEAD CODE STROKE WO CONTRAST Addendum Date: 12/27/2023 ADDENDUM REPORT: 12/27/2023 13:43 ADDENDUM: Impression #1 called by telephone at the time of interpretation on 12/27/2023 at  1:42 pm to provider Dr. Jerrie, who verbally acknowledged these results. Electronically Signed   By: Rockey Childs D.O.   On: 12/27/2023 13:43   Result Date: 12/27/2023 CLINICAL DATA:  Code stroke. Provided history: Neuro deficit, acute, stroke suspected. Aphasia. EXAM: CT HEAD WITHOUT CONTRAST TECHNIQUE: Contiguous axial images were obtained from the base of the skull through the vertex without intravenous contrast. RADIATION DOSE REDUCTION: This exam was performed according to the departmental dose-optimization program which includes automated exposure control, adjustment of the mA and/or kV according to patient size and/or use of iterative reconstruction technique. COMPARISON:  Non-contrast head CT and CT angiogram head/neck 12/20/2021. Brain MRI 12/20/2021. FINDINGS: Brain: Generalized cerebral atrophy. Acute or subacute cortical/subcortical infarct within the mid left frontal lobe (MCA territory), new from the prior head CT of 12/20/2021. The infarct spans 3.4 cm. A known chronic cortical infarct within the left occipital lobe was better appreciated on the prior brain MRI of 12/20/2021 (acute at that time). Background moderate patchy and ill-defined hypoattenuation within the cerebral white matter, nonspecific but compatible chronic small vessel ischemic disease. There is no acute intracranial hemorrhage. No extra-axial fluid collection. No evidence of an intracranial mass. No midline shift. Vascular: No hyperdense vessel.  Atherosclerotic calcifications. Skull: No calvarial fracture or aggressive osseous lesion. Sinuses/Orbits: No mass or acute finding within the imaged orbits. No significant paranasal sinus disease at the imaged levels. ASPECTS [facility] Stroke Program Early CT Score) - Ganglionic level infarction (caudate, lentiform nuclei, internal capsule, insula, M1-M3 cortex): 2 - Supraganglionic infarction (M4-M6 cortex): 6 Total score (0-10  with 10 being normal): 8 Attempts are being made to reach the  ordering provider at this time. IMPRESSION: 1. 3.4 cm acute or subacute cortical/subcortical infarct within the mid left frontal lobe (MCA vascular territory), new from the prior head CT of 12/20/2021. ASPECTS is 8. 2. Background parenchymal atrophy, chronic small vessel ischemic disease and chronic left occipital lobe infarct, as described. Electronically Signed: By: Rockey Childs D.O. On: 12/27/2023 13:39   EKG: Independently reviewed.  Sinus rhythm at 63 bpm.  Left bundle branch block.  Hypertrophy with repolarization abnormality.  Borderline QTc at 481.  Assessment/Plan Active Problems:   PAF (paroxysmal atrial fibrillation) (HCC)   COPD  GOLD II   Depression   HLD (hyperlipidemia)   Essential hypertension   Primary cancer of upper outer quadrant of right female breast (HCC)   Chronic systolic CHF (congestive heart failure) (HCC)   Aortic valve disease   Chronic respiratory failure with hypoxia (HCC)   History of CVA (cerebrovascular accident)   CAD (coronary artery disease)   Acute versus subacute CVA > Presented to cardiology office today for regular follow-up and found to have word finding difficulty.  Unknown when last normal was patient was not speaking much at home. > History of prior CVA noted > CT in the ED showed 3.4 cm acute versus subacute CVA. > Neurology consulted in the ED, MRI brain, MRA head, carotid ultrasound ordered. Hide and appreciate neurology recommendations and assistance - Allow for permissive HTN (systolic < 220 and diastolic < 120) - On Eliquis , holding until MRI is done to confirm no hemorrhagic conversion - Continue home statin, Zetia  - Recent echocardiogram  - Carotid doppler - A1C  - Lipid panel  - Tele monitoring  - SLP eval - PT/OT  Hypertension - Rest of hypertension as above  Hyperlipidemia - Continue home atorvastatin , Zetia   Paroxysmal atrial fibrillation - Continue home amiodarone  - Holding Eliquis  until after MRI as above - Holding  home beta-blocker as above  CAD > Nonobstructive on cath in March 2025 - Holding Eliquis  for now - Continue atorvastatin , Zetia  - Holding beta-blocker as above - Holding ARB  Chronic systolic CHF Aortic insufficiency > Last echo was in May and showed EF 35-40%, global hypokinesis, mild to moderate tricuspid regurgitation.  Status post aortic valve replacement. - Holding Lasix , Coreg , losartan  as above - Continue to monitor  History of breast cancer - Noted  Depression Anxiety - Continue home sertraline   COPD Chronic respiratory failure with hypoxia > On 5 L chronically at home - Continue home Breztri  - As needed albuterol   Anemia > Hemoglobin stable at 10.2 - Trend CBC  DVT prophylaxis: Eliquis , after MRI confirms no hemorrhagic conversion Code Status:   Full Family Communication:  None on admission  Disposition Plan:   Patient is from:  Home  Anticipated DC to:  Home  Anticipated DC date:  1 to 2 days  Anticipated DC barriers: None  Consults called:  Neurology Admission status:  Observation, telemetry  Severity of Illness: The appropriate patient status for this patient is OBSERVATION. Observation status is judged to be reasonable and necessary in order to provide the required intensity of service to ensure the patient's safety. The patient's presenting symptoms, physical exam findings, and initial radiographic and laboratory data in the context of their medical condition is felt to place them at decreased risk for further clinical deterioration. Furthermore, it is anticipated that the patient will be medically stable for discharge from the hospital within 2 midnights  of admission.    Marsa KATHEE Scurry MD [facility]  How to contact the [facility] Attending or Consulting provider 7A - 7P or covering provider during after hours 7P -7A, for this patient?   Check the care team in [facility] and look for a) attending/consulting [facility] provider listed and b) the [facility] team  listed Log into [email or web address] and use [facility]'s universal password to access. If you do not have the password, please contact the hospital operator. Locate the [facility] provider you are looking for under [facility] and page to a number that you can be directly reached. If you still have difficulty reaching the provider, please page the [facility] (Director on Call) for the Hospitalists listed on amion for assistance.  12/27/2023, 2:39 PM

## 2023-12-27 NOTE — Code Documentation (Signed)
 Stroke Response Nurse Documentation Code Documentation  Dana Pruitt is a 83 y.o. female arriving to [facility]  via [facility] on 12/27/2023 with past medical hx of aortic valve insufficiency status post AVR, systolic CHF, nonishemic cardiomyopathy with EF of 35 to 40%, afib on eliquis , stroke, COPD, HTN, HLD, CKD III, breast CA 2013. On Eliquis  (apixaban ) daily. Code stroke was activated by EMS.   Patient from cardiology appointment where she was LKW at around 1100 today. The patient went to her cardiologist appointment for a routine checkup and was found to have acute word finding ability.  Stroke team at the bedside on patient arrival. Labs drawn and patient cleared for CT by Dr. Neysa. Patient to CT with team. NIHSS 1, see documentation for details and code stroke times. Patient with Expressive aphasia  on exam. The following imaging was completed:  CT Head. Patient is a candidate for IV Thrombolytic due to currently on eliquis  with symptoms too mild to treat per MD. Patient is not a candidate for IR due to no LVO noted on imaging per MD.   Care Plan: VS.NIHSS q2hr x12hour, then q4hr; BP Goal <220-120.   Bedside handoff with ED RN Chiquita.    Dana Pruitt  Stroke Response RN

## 2023-12-27 NOTE — Progress Notes (Signed)
 Patient arrived on unit. Patient able to walk from ED bed to room bed with steady gait. Patient acclimated to unit and floor. Bed locked at the lowest position and call bell within reach.

## 2023-12-27 NOTE — Consult Note (Signed)
 NEUROLOGY CONSULT NOTE   Date of service: December 27, 2023 Patient Name: Dana Pruitt MRN:  [id number] DOB:  09/08/1940 Chief Complaint: Aphasia Requesting Provider: Neysa Caron PARAS, DO  History of Present Illness  Dana Pruitt is a 83 y.o. female with hx of aortic valve insufficiency status post AVR, systolic CHF, nonischemic cardiomyopathy with EF of 35 to 40%, paroxysmal atrial fibrillation on Eliquis , ascending thoracic aneurysm, stroke, COPD, hypertension, hyperlipidemia, CKD stage III, GERD, anxiety, depression and breast cancer in 2013 presents with acute onset aphasia.  Patient is uncertain when her last known well was that she stated that she did not speak much while she was at home.  She went to her cardiologist appointment for which she states was a routine checkup and was found to have new onset word finding difficulty.  She does take Eliquis  for her atrial fibrillation and states that she has not missed any doses recently.  Acute to subacute infarct seen on initial head CT and left frontal lobe.  LKW: Uncertain, possibly 1040 a.m., versus yesterday evening per PCP note Modified rankin score: 1-No significant post stroke disability and can perform usual duties with stroke symptoms IV Thrombolysis: No, on Eliquis  and symptoms too mild to treat EVT: No, completed stroke seen on CT and symptoms too mild to treat  NIHSS components Score: Comment  1a Level of Conscious 0[x]  1[]  2[]  3[]      1b LOC Questions 0[x]  1[]  2[]       1c LOC Commands 0[x]  1[]  2[]       2 Best Gaze 0[x]  1[]  2[]       3 Visual 0[x]  1[]  2[]  3[]      4 Facial Palsy 0[x]  1[]  2[]  3[]      5a Motor Arm - left 0[x]  1[]  2[]  3[]  4[]  UN[]    5b Motor Arm - Right 0[x]  1[]  2[]  3[]  4[]  UN[]    6a Motor Leg - Left 0[x]  1[]  2[]  3[]  4[]  UN[]    6b Motor Leg - Right 0[x]  1[]  2[]  3[]  4[]  UN[]    7 Limb Ataxia 0[x]  1[]  2[]  UN[]      8 Sensory 0[x]  1[]  2[]  UN[]      9 Best Language 0[]  1[x]  2[]  3[]      10 Dysarthria 0[x]  1[]  2[]  UN[]       11 Extinct. and Inattention 0[x]  1[]  2[]       TOTAL:       ROS  Comprehensive ROS performed and pertinent positives documented in HPI  Past History   Past Medical History:  Diagnosis Date   Anemia    several times over the years (10/17/2012)   Anxiety    Breast cancer (HCC) 02/2010   right breast   CAD (coronary artery disease)    a. LHC 12/2018: mild non-obstructive CAD   Carcinoma of breast treated with adjuvant hormone therapy (HCC) 02/2010   Femara    Chronic systolic CHF    a. Echo in 2015: LVEF of 35-40%, b. Echo in 12/2021: LVEF of 60-65%   COPD (chronic obstructive pulmonary disease) (HCC)    Depression    GERD (gastroesophageal reflux disease)    diet controlled, no med   Hollenhorst plaque, left eye    apparently resolved at recent ophthalmology visit   Hyperlipidemia    Hypertension    IDC, RightStage II, 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   Moderate aortic insufficiency    Non-ischemic cardiomyopathy (HCC)    Paroxysmal atrial fibrillation (HCC)    Pneumonia    that's why I'm here (10/17/2012)  S/P AVR (aortic valve replacement) 04/26/2019   Stroke (HCC)    12/2021    Past Surgical History:  Procedure Laterality Date   AORTIC VALVE REPLACEMENT N/A 01/31/2019   Procedure: AORTIC VALVE REPLACEMENT (AVR) USING 21 MM INSPIRIS RESILIS AORTIC VALVE. SN: [id number];  Surgeon: Fleeta Ochoa, Maude, MD;  Location: [facility] OR;  Service: Open Heart Surgery;  Laterality: N/A;   APPENDECTOMY  1974   BREAST BIOPSY Right 02/27/10   Needle core Biopsy; Invasive Mammary; ER/PR Positive, Her-2 Neu negative, Ki-67 22%   BREAST LUMPECTOMY WITH SENTINEL LYMPH NODE BIOPSY Right 07/03/11   Invasive Ductal Carcinoma;0/3 nodes negative,; ER,PR Positive, Her-2 Neg; Ki-67 22%   CESAREAN SECTION  1970; 1974   COLONOSCOPY     DILATION AND CURETTAGE OF UTERUS  1970's   RIGHT HEART CATH AND CORONARY ANGIOGRAPHY N/A 08/31/2023   Procedure: RIGHT HEART CATH AND CORONARY ANGIOGRAPHY;  Surgeon: Wonda Sharper, MD;  Location: [facility] INVASIVE CV LAB;  Service: Cardiovascular;  Laterality: N/A;   RIGHT/LEFT HEART CATH AND CORONARY ANGIOGRAPHY N/A 01/02/2019   Procedure: RIGHT/LEFT HEART CATH AND CORONARY ANGIOGRAPHY;  Surgeon: Rolan Ezra RAMAN, MD;  Location: [facility] INVASIVE CV LAB;  Service: Cardiovascular;  Laterality: N/A;   TEE WITHOUT CARDIOVERSION N/A 01/31/2019   Procedure: TRANSESOPHAGEAL ECHOCARDIOGRAM (TEE);  Surgeon: Fleeta Ochoa, Maude, MD;  Location: [facility] OR;  Service: Open Heart Surgery;  Laterality: N/A;   TUBAL LIGATION  1974   WISDOM TOOTH EXTRACTION      Family History: Family History  Problem Relation Age of Onset   Heart disease Mother    Cancer Father    Heart disease Maternal Grandmother    Heart disease Maternal Grandfather    Cancer Paternal Grandmother    Lung disease Neg Hx     Social History  reports that she quit smoking about 33 years ago. Her smoking use included cigarettes. She started smoking about 65 years ago. She has a 96.3 pack-year smoking history. She has never used smokeless tobacco. She reports that she does not currently use alcohol. She reports that she does not use drugs.  No Known Allergies  Medications   Current Facility-Administered Medications:    [START ON 12/28/2023]  stroke: early stages of recovery book, , Does not apply, Once, de Clint Kill, Cortney E, NP  Current Outpatient Medications:    albuterol  (PROVENTIL ) (2.5 MG/3ML) 0.083% nebulizer solution, Take 3 mLs (2.5 mg total) by nebulization every 6 (six) hours as needed for wheezing or shortness of breath., Disp: 75 mL, Rfl: 5   albuterol  (VENTOLIN  HFA) 108 (90 Base) MCG/ACT inhaler, Inhale 2 puffs into the lungs every 6 (six) hours as needed for wheezing or shortness of breath (4 times daily). INHALE 2 PUFFS BY MOUTH EVERY 4 HOURS AS NEEDED ONLY IF YOU CAN'T CATCH YOUR BREATH, Disp: 18 g, Rfl: 3   amiodarone  (PACERONE ) 200 MG tablet, Take 1 tablet (200 mg total) by mouth daily., Disp: 90 tablet,  Rfl: 3   apixaban  (ELIQUIS ) 2.5 MG TABS tablet, Take 1 tablet (2.5 mg total) by mouth 2 (two) times daily., Disp: 180 tablet, Rfl: 1   atorvastatin  (LIPITOR) 20 MG tablet, Take 20 mg by mouth daily. , Disp: , Rfl:    bisoprolol  (ZEBETA ) 5 MG tablet, Take 0.5 tablets (2.5 mg total) by mouth daily., Disp: 15 tablet, Rfl: 3   BREZTRI  AEROSPHERE 160-9-4.8 MCG/ACT AERO, INHALE 2 PUFFS INTO THE LUNGS TWICE DAILY, Disp: 10.7 g, Rfl: 11   Cholecalciferol  (VITAMIN D3) 25  MCG (1000 UT) CAPS, Take 1,000 Units by mouth daily. (Patient not taking: Reported on 12/27/2023), Disp: , Rfl:    ezetimibe  (ZETIA ) 10 MG tablet, Take 10 mg by mouth daily., Disp: , Rfl:    FEROSUL 325 (65 Fe) MG tablet, Take 325 mg by mouth every other day., Disp: , Rfl:    fluticasone  (FLONASE ) 50 MCG/ACT nasal spray, SHAKE LIQUID AND USE 2 SPRAYS IN EACH NOSTRIL DAILY, Disp: 16 g, Rfl: 11   guaiFENesin  (MUCINEX ) 600 MG 12 hr tablet, Take 1 tablet (600 mg total) by mouth 2 (two) times daily., Disp: 30 tablet, Rfl: 0   guaiFENesin -dextromethorphan  (ROBITUSSIN DM) 100-10 MG/5ML syrup, Take 5 mLs by mouth every 4 (four) hours as needed for cough. (Patient not taking: Reported on 12/27/2023), Disp: 118 mL, Rfl: 0   ipratropium (ATROVENT ) 0.06 % nasal spray, Place 2 sprays into both nostrils 4 (four) times daily., Disp: 15 mL, Rfl: 12   losartan  (COZAAR ) 25 MG tablet, Take 25 mg by mouth daily., Disp: , Rfl:    OXYGEN , Inhale 5 L/min into the lungs continuous., Disp: , Rfl:    predniSONE  (DELTASONE ) 10 MG tablet, 3 tabs daily for 3 days then 2 tabs daily for 3 days then 1 tab daily for 3 days (Patient not taking: Reported on 12/27/2023), Disp: 18 tablet, Rfl: 0   sertraline  (ZOLOFT ) 100 MG tablet, Take 200 mg by mouth daily., Disp: , Rfl:    valACYclovir (VALTREX) 1000 MG tablet, Take 500 mg by mouth daily as needed (cold sores)., Disp: , Rfl:   Facility-Administered Medications Ordered in Other Encounters:    sodium chloride  flush (NS) 0.9 %  injection 3 mL, 3 mL, Intravenous, Q12H, Rolan Ezra RAMAN, MD  Vitals   Vitals:   12/27/23 1257 12/27/23 1313 12/27/23 1315 12/27/23 1330  BP: 124/75 (!) 114/98 (!) 114/98   Pulse: 63  61 63  Resp: (!) 24  (!) 24 (!) 23  Temp: 98.2 F (36.8 C)     TempSrc: Oral     SpO2: (!) 83%  94% 98%  Weight:      Height:        Body mass index is 20.25 kg/m.   Physical Exam   Constitutional: Appears well-developed and well-nourished.  Psych: Affect appropriate to situation.  Eyes: No scleral injection.  HENT: No OP obstruction.  Head: Normocephalic.  Cardiovascular: Normal rate and regular rhythm.  Respiratory: Effort normal, non-labored breathing on supplemental O2.  Skin: WDI.   Neurologic Examination    NEURO:  Mental Status: AA&Ox3, able to give some history of present illness Speech/Language: speech is without dysarthria but with some word finding difficulties.  She is able to speak in complete sentences with sometimes gets stuck on a word.  She was able to name all objects on the NIHSS card and could describe what was going on in the picture in full sentences  Cranial Nerves:  II: PERRL. Visual fields full.  III, IV, VI: EOMI. Eyelids elevate symmetrically.  V: Sensation is intact to light touch and symmetrical to face.  VII: Smile is symmetrical.  VIII: hearing intact to voice. IX, X: Phonation is normal.  KP:Dynloizm shrug 5/5. XII: tongue is midline without fasciculations. Motor: Able to move all 4 extremities with good antigravity strength Tone: is normal and bulk is normal Sensation- Intact to light touch bilaterally. Extinction absent to light touch to DSS. Coordination: FTN intact bilaterally, HKS: no ataxia in BLE. Gait- deferred    Labs/Imaging/Neurodiagnostic studies  CBC:  Recent Labs  Lab 12/27/23 1307 12/27/23 1321  WBC 6.7  --   NEUTROABS 5.3  --   HGB 10.2* 9.9*  HCT 32.8* 29.0*  MCV 98.2  --   PLT 226  --    Basic Metabolic Panel:  Lab  Results  Component Value Date   NA 137 12/27/2023   K 4.1 12/27/2023   CO2 26 10/25/2023   GLUCOSE 130 (H) 12/27/2023   BUN 20 12/27/2023   CREATININE 1.40 (H) 12/27/2023   CALCIUM  8.9 10/25/2023   GFRNONAA 48 (L) 10/25/2023   GFRAA 48 (L) 03/13/2020   Lipid Panel:  Lab Results  Component Value Date   LDLCALC 47 08/31/2023   HgbA1c:  Lab Results  Component Value Date   HGBA1C 5.9 (H) 12/20/2021   Urine Drug Screen: No results found for: LABOPIA, COCAINSCRNUR, LABBENZ, AMPHETMU, THCU, LABBARB  Alcohol Level     Component Value Date/Time   ETH <10 12/20/2021 1436   INR  Lab Results  Component Value Date   INR 1.4 (H) 12/27/2023   APTT  Lab Results  Component Value Date   APTT 31 12/27/2023   AED levels: No results found for: PHENYTOIN, ZONISAMIDE, LAMOTRIGINE, LEVETIRACETA  CT Head without contrast(Personally reviewed): 3.4 centimeter acute or subacute cortical and subcortical infarct within mid left frontal lobe  MR Angio head without contrast and Carotid Duplex BL(Personally reviewed): Pending  MRI Brain(Personally reviewed): Pending  ASSESSMENT   AMYAH CLAWSON is a 83 y.o. female with history of aortic valve insufficiency status post AVR, systolic CHF, nonischemic cardiomyopathy with EF of 35 to 40%, paroxysmal atrial fibrillation on Eliquis , ascending thoracic aneurysm, stroke, COPD, hypertension, hyperlipidemia, CKD stage III, GERD, anxiety, depression and breast cancer in 2013 who presents with acute onset word finding difficulties.  Last known well time is unclear, as patient states she did not speak much before leaving the house today.  A friend came to get her for her cardiologist appointment and noted that she was having difficulty with word finding.  She was sent to the emergency department from the clinic due to some aphasia and confusion.  On arrival, she was noted to have some word finding difficulties but could not reliably name  objects and could usually speak in full sentences.  Initial CT head demonstrates an acute to subacute infarct in the mid left frontal lobe.  Patient states that she has not missed any doses of her Eliquis , which she takes for atrial fibrillation, so there is concern for potential Eliquis  failure.  Will admit for full stroke workup.  RECOMMENDATIONS  Stroke/TIA Workup - Admit for stroke workup - Permissive HTN x48 hrs from sx onset goal BP <220/110. PRN labetalol  or hydralazine  if BP above these parameters. Avoid oral antihypertensives. - MRI brain wo contrast - MRA head and carotid ultrasound - TTE - Check A1c and LDL + add statin per guidelines - Hold Eliquis  pending MRI results to confirm size of stroke and any evidence of hemorrhagic transformation - Aspirin  325 mg ideally starting tomorrow, unless stroke size is felt to be small enough to resume Eliquis  - q4 hr neuro checks - STAT head CT for any change in neuro exam - Tele - PT/OT/SLP - Stroke education - Stroke team to follow in consultation  - Amb referral to neurology upon discharge   ______________________________________________________________________  Patient seen by NP with MD, MD to edit note as needed.  Signed, Cortney E Everitt Clint Kill, NP [facility]  Attending Neurologist's note:  I personally saw this patient, gathering history, performing a full neurologic examination, reviewing relevant labs, personally reviewing relevant imaging including head CT, and formulated the assessment and plan, adding the note above for completeness and clarity to accurately reflect my thoughts   Lola Jernigan MD-PhD [facility] [phone] Available 7 AM to 7 PM, outside these hours please contact Neurologist on call listed on AMION

## 2023-12-27 NOTE — ED Notes (Signed)
Patient arrived to room from MRI.  

## 2023-12-28 ENCOUNTER — Other Ambulatory Visit: Payer: Self-pay | Admitting: Internal Medicine

## 2023-12-28 ENCOUNTER — Observation Stay (HOSPITAL_BASED_OUTPATIENT_CLINIC_OR_DEPARTMENT_OTHER)

## 2023-12-28 DIAGNOSIS — I429 Cardiomyopathy, unspecified: Secondary | ICD-10-CM | POA: Diagnosis not present

## 2023-12-28 DIAGNOSIS — I509 Heart failure, unspecified: Secondary | ICD-10-CM

## 2023-12-28 DIAGNOSIS — I639 Cerebral infarction, unspecified: Secondary | ICD-10-CM

## 2023-12-28 DIAGNOSIS — Z853 Personal history of malignant neoplasm of breast: Secondary | ICD-10-CM

## 2023-12-28 DIAGNOSIS — J9611 Chronic respiratory failure with hypoxia: Secondary | ICD-10-CM | POA: Diagnosis not present

## 2023-12-28 DIAGNOSIS — E785 Hyperlipidemia, unspecified: Secondary | ICD-10-CM | POA: Diagnosis not present

## 2023-12-28 DIAGNOSIS — I480 Paroxysmal atrial fibrillation: Secondary | ICD-10-CM | POA: Diagnosis not present

## 2023-12-28 DIAGNOSIS — I6340 Cerebral infarction due to embolism of unspecified cerebral artery: Secondary | ICD-10-CM

## 2023-12-28 DIAGNOSIS — Z952 Presence of prosthetic heart valve: Secondary | ICD-10-CM

## 2023-12-28 LAB — COMPREHENSIVE METABOLIC PANEL WITH GFR
ALT: 14 U/L (ref 0–44)
AST: 16 U/L (ref 15–41)
Albumin: 3.1 g/dL — ABNORMAL LOW (ref 3.5–5.0)
Alkaline Phosphatase: 30 U/L — ABNORMAL LOW (ref 38–126)
Anion gap: 9 (ref 5–15)
BUN: 18 mg/dL (ref 8–23)
CO2: 25 mmol/L (ref 22–32)
Calcium: 9.3 mg/dL (ref 8.9–10.3)
Chloride: 106 mmol/L (ref 98–111)
Creatinine, Ser: 1.25 mg/dL — ABNORMAL HIGH (ref 0.44–1.00)
GFR, Estimated: 43 mL/min — ABNORMAL LOW (ref >60)
Glucose, Bld: 106 mg/dL — ABNORMAL HIGH (ref 70–99)
Potassium: 4.2 mmol/L (ref 3.5–5.1)
Sodium: 140 mmol/L (ref 135–145)
Total Bilirubin: 0.4 mg/dL (ref 0.0–1.2)
Total Protein: 5.1 g/dL — ABNORMAL LOW (ref 6.5–8.1)

## 2023-12-28 LAB — LIPID PANEL
Cholesterol: 151 mg/dL (ref 0–200)
HDL: 38 mg/dL — ABNORMAL LOW (ref >40)
LDL Cholesterol: 93 mg/dL (ref 0–99)
Total CHOL/HDL Ratio: 4 ratio
Triglycerides: 101 mg/dL (ref <150)
VLDL: 20 mg/dL (ref 0–40)

## 2023-12-28 LAB — CBC
HCT: 27.8 % — ABNORMAL LOW (ref 36.0–46.0)
Hemoglobin: 8.9 g/dL — ABNORMAL LOW (ref 12.0–15.0)
MCH: 30.8 pg (ref 26.0–34.0)
MCHC: 32 g/dL (ref 30.0–36.0)
MCV: 96.2 fL (ref 80.0–100.0)
Platelets: 199 K/uL (ref 150–400)
RBC: 2.89 MIL/uL — ABNORMAL LOW (ref 3.87–5.11)
RDW: 14.9 % (ref 11.5–15.5)
WBC: 5.1 K/uL (ref 4.0–10.5)
nRBC: 0 % (ref 0.0–0.2)

## 2023-12-28 LAB — ECHOCARDIOGRAM COMPLETE
AR max vel: 0.79 cm2
AV Mean grad: 9 mmHg
AV Peak grad: 17.6 mmHg
Ao pk vel: 2.1 m/s
Area-P 1/2: 4.65 cm2
Calc EF: 57.4 %
Height: 64 in
S' Lateral: 2.7 cm
Single Plane A2C EF: 62.7 %
Single Plane A4C EF: 57.1 %
Weight: 1888 [oz_av]

## 2023-12-28 LAB — HEMOGLOBIN A1C
Hgb A1c MFr Bld: 5.5 % (ref 4.8–5.6)
Mean Plasma Glucose: 111.15 mg/dL

## 2023-12-28 MED ORDER — APIXABAN 2.5 MG PO TABS
2.5000 mg | ORAL_TABLET | Freq: Two times a day (BID) | ORAL | Status: DC
  Administered 2023-12-28: 2.5 mg via ORAL
  Filled 2023-12-28: qty 1

## 2023-12-28 NOTE — Plan of Care (Signed)

## 2023-12-28 NOTE — Hospital Course (Signed)
 [name] is an 83 yo female with PMH right breast cancer, A-fib on Eliquis , COPD, chronic hypoxia on 5 L oxygen , iron deficiency anemia, HTN, HLD, CHF, aortic insufficiency s/p aortic valve replacement, depression/anxiety who presented with aphasia. She was able to understand what people were asking but when trying to respond she could not get her words out.  She was at a cardiology appointment for follow-up visit when this happened and was referred to the ER immediately.  MRI brain was positive for acute infarct involving left frontal lobe. Her symptoms appear to sound consistent with Broca's aphasia and correlate with stroke location on MRI. Symptoms had started approximately 11 AM on 12/27/2023 and had resolved approximately around dinnertime that same day per patient.  She has been compliant on Eliquis  with no missed doses.  She is on reduced dosing, 2.5 mg twice daily in setting of her age and weight. Carotid ultrasound negative for significant stenosis.  Atrial septum noted to be normal on echo.  Neurology recommended CT chest/abdomen/pelvis to evaluate for any potential cancer recurrence given stroke while on anticoagulation. Patient desired to obtain CT as outpatient in order to facilitate discharge from the hospital.  This was ordered and patient understands to call radiology at discharge to schedule. No medication changes made at discharge and she will also follow-up with neurology outpatient.

## 2023-12-28 NOTE — Progress Notes (Signed)
 Carotid duplex  has been completed. Refer to [facility] under chart review to view preliminary results.   12/28/2023  10:46 AM Cambryn Charters, Ricka BIRCH

## 2023-12-28 NOTE — Evaluation (Signed)
 Speech Language Pathology Evaluation Patient Details Name: Dana Pruitt MRN: [id number] DOB: 1941/04/07 Today's Date: 12/28/2023 Time: 8894-8873 SLP Time Calculation (min) (ACUTE ONLY): 21 min  Problem List:  Patient Active Problem List   Diagnosis Date Noted   Anemia 12/27/2023   Anxiety 12/27/2023   Stage 3a chronic kidney disease (HCC) 12/27/2023   History of CVA (cerebrovascular accident) 12/27/2023   CAD (coronary artery disease) 12/27/2023   Acute CVA (cerebrovascular accident) (HCC) 12/27/2023   Protein-calorie malnutrition, severe 10/22/2023   Chest pain 08/30/2023   Non-ischemic cardiomyopathy (HCC)    Solitary pulmonary nodule on lung CT 01/05/2022   Allergic rhinitis 12/20/2021   Thoracic aortic aneurysm (HCC) 08/26/2021   Neck muscle spasm 08/30/2019   S/P AVR (aortic valve replacement) 04/26/2019   Essential hypertension 04/19/2019   Chronic respiratory failure with hypoxia (HCC) 04/19/2019   PAF (paroxysmal atrial fibrillation) (HCC) 02/09/2019   Aortic valve disease 01/31/2019   Pulmonary hypertension (HCC)    Chronic systolic CHF (congestive heart failure) (HCC) 12/16/2018   Severe aortic regurgitation 09/05/2014   HLD (hyperlipidemia) 03/28/2014   Hollenhorst plaque, left eye 02/15/2014   Osteopenia 11/16/2013   COPD  GOLD II 11/22/2012   Depression 10/17/2012   Primary cancer of upper outer quadrant of right female breast (HCC) 02/27/2010   Past Medical History:  Past Medical History:  Diagnosis Date   Anemia    several times over the years (10/17/2012)   Anxiety    Breast cancer (HCC) 02/2010   right breast   CAD (coronary artery disease)    a. LHC 12/2018: mild non-obstructive CAD   Carcinoma of breast treated with adjuvant hormone therapy (HCC) 02/2010   Femara    Chronic systolic CHF    a. Echo in 2015: LVEF of 35-40%, b. Echo in 12/2021: LVEF of 60-65%   COPD (chronic obstructive pulmonary disease) (HCC)    Depression    GERD  (gastroesophageal reflux disease)    diet controlled, no med   Hollenhorst plaque, left eye    apparently resolved at recent ophthalmology visit   Hyperlipidemia    Hypertension    IDC, RightStage II, 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   Moderate aortic insufficiency    Non-ischemic cardiomyopathy (HCC)    Paroxysmal atrial fibrillation (HCC)    Pneumonia    that's why I'm here (10/17/2012)   S/P AVR (aortic valve replacement) 04/26/2019   Stroke (HCC)    12/2021   Past Surgical History:  Past Surgical History:  Procedure Laterality Date   AORTIC VALVE REPLACEMENT N/A 01/31/2019   Procedure: AORTIC VALVE REPLACEMENT (AVR) USING 21 MM INSPIRIS RESILIS AORTIC VALVE. SN: [id number];  Surgeon: Fleeta Ochoa, Maude, MD;  Location: [facility] OR;  Service: Open Heart Surgery;  Laterality: N/A;   APPENDECTOMY  1974   BREAST BIOPSY Right 02/27/10   Needle core Biopsy; Invasive Mammary; ER/PR Positive, Her-2 Neu negative, Ki-67 22%   BREAST LUMPECTOMY WITH SENTINEL LYMPH NODE BIOPSY Right 07/03/11   Invasive Ductal Carcinoma;0/3 nodes negative,; ER,PR Positive, Her-2 Neg; Ki-67 22%   CESAREAN SECTION  1970; 1974   COLONOSCOPY     DILATION AND CURETTAGE OF UTERUS  1970's   RIGHT HEART CATH AND CORONARY ANGIOGRAPHY N/A 08/31/2023   Procedure: RIGHT HEART CATH AND CORONARY ANGIOGRAPHY;  Surgeon: Wonda Sharper, MD;  Location: [facility] INVASIVE CV LAB;  Service: Cardiovascular;  Laterality: N/A;   RIGHT/LEFT HEART CATH AND CORONARY ANGIOGRAPHY N/A 01/02/2019   Procedure: RIGHT/LEFT HEART CATH AND CORONARY ANGIOGRAPHY;  Surgeon: Rolan Barrack  S, MD;  Location: [facility] INVASIVE CV LAB;  Service: Cardiovascular;  Laterality: N/A;   TEE WITHOUT CARDIOVERSION N/A 01/31/2019   Procedure: TRANSESOPHAGEAL ECHOCARDIOGRAM (TEE);  Surgeon: Fleeta Ochoa, Maude, MD;  Location: [facility] OR;  Service: Open Heart Surgery;  Laterality: N/A;   TUBAL LIGATION  1974   WISDOM TOOTH EXTRACTION     HPI:  83 y.o. female admitted 12/27/23 with CVA.  Recent  admits 10/21/23, 08/30/23 and 09/13/23 with flu, PNA. Other PMH includes COPD, CHF, CAD, HTN, HLD, PAF, s/p AVR, CA, breast CA.  CT Head:3.4 cm acute or subacute cortical/subcortical infarct within the  mid left frontal lobe (MCA vascular territory).   Assessment / Plan / Recommendation Clinical Impression  A cognitive-linguistic evaluation was administered via informal measures. Patient with relatively functional expressive/receptive language with occasional word finding difficulties at the conversational level, though with independent compensation. Patient answered yes/no questions and followed 1-3 step commands with 100% accuracy. Patient with intact automatic speech and repetition. Patient with 100% accuracy during responsive and confrontational naming tasks. Cognitively, patient is oriented x4 with intact attention, awareness and problem solving skills. Patient presents with mild deficits in STM. SLP reviewed WRAP (write, repeat, associate, picture) compensatory strategies. Plan for hospital d/c to home this date. No further ST needs.     SLP Assessment  SLP Recommendation/Assessment: Patient does not need any further Speech Language Pathology Services SLP Visit Diagnosis: Cognitive communication deficit (R41.841)     Assistance Recommended at Discharge  Set up Supervision/Assistance  Functional Status Assessment Patient has had a recent decline in their functional status and demonstrates the ability to make significant improvements in function in a reasonable and predictable amount of time.     SLP Evaluation Cognition  Overall Cognitive Status: Impaired/Different from baseline Arousal/Alertness: Awake/alert Orientation Level: Oriented X4 Year: 2025 Month: July Day of Week: Correct Attention: Sustained;Focused Focused Attention: Appears intact Sustained Attention: Appears intact Memory: Impaired Memory Impairment: Decreased short term memory Decreased Short Term Memory: Verbal  basic Awareness: Appears intact Problem Solving: Appears intact Safety/Judgment: Appears intact       Comprehension  Auditory Comprehension Overall Auditory Comprehension: Appears within functional limits for tasks assessed Yes/No Questions: Within Functional Limits Commands: Within Functional Limits Conversation: Complex    Expression Expression Primary Mode of Expression: Verbal Verbal Expression Overall Verbal Expression: Impaired Initiation: No impairment Automatic Speech: Name;Social Response;Counting;Day of week;Month of year Level of Generative/Spontaneous Verbalization: Conversation Repetition: No impairment Naming: Impairment (occasional at the conversational level - independently compensates) Responsive: 76-100% accurate Confrontation: Within functional limits Pragmatics: No impairment   Oral / Motor  Oral Motor/Sensory Function Overall Oral Motor/Sensory Function: Within functional limits Motor Speech Overall Motor Speech: Appears within functional limits for tasks assessed           Tex Conroy M.A., CCC-SLP 12/28/2023, 11:32 AM

## 2023-12-28 NOTE — Progress Notes (Addendum)
 STROKE TEAM PROGRESS NOTE   SUBJECTIVE (INTERVAL HISTORY) No family is at the bedside.  Overall her condition is completely resolved.  Patient stated that her speech has back to normal.  Discussed both pan CT to rule out  malignancy given her stroke in the setting of Eliquis , however, patient is eager to go home and requested to be done as outpatient.   OBJECTIVE Temp:  [97.7 F (36.5 C)-98.5 F (36.9 C)] 98 F (36.7 C) (07/08 1157) Pulse Rate:  [65-69] 69 (07/08 1157) Cardiac Rhythm: Normal sinus rhythm (07/08 0800) Resp:  [18-21] 20 (07/08 1157) BP: (124-156)/(66-81) 137/81 (07/08 1157) SpO2:  [92 %-98 %] 95 % (07/08 1157)  Recent Labs  Lab 12/27/23 1314  GLUCAP 124*   Recent Labs  Lab 12/27/23 1307 12/27/23 1321 12/28/23 0457  NA 140 137 140  K 4.1 4.1 4.2  CL 102 104 106  CO2 27  --  25  GLUCOSE 133* 130* 106*  BUN 17 20 18   CREATININE 1.33* 1.40* 1.25*  CALCIUM  9.7  --  9.3   Recent Labs  Lab 12/27/23 1307 12/28/23 0457  AST 21 16  ALT 18 14  ALKPHOS 38 30*  BILITOT 0.7 0.4  PROT 6.2* 5.1*  ALBUMIN  3.8 3.1*   Recent Labs  Lab 12/27/23 1307 12/27/23 1321 12/28/23 0457  WBC 6.7  --  5.1  NEUTROABS 5.3  --   --   HGB 10.2* 9.9* 8.9*  HCT 32.8* 29.0* 27.8*  MCV 98.2  --  96.2  PLT 226  --  199   No results for input(s): CKTOTAL, CKMB, CKMBINDEX, TROPONINI in the last 168 hours. Recent Labs    12/27/23 1307  LABPROT 17.6*  INR 1.4*   No results for input(s): COLORURINE, LABSPEC, PHURINE, GLUCOSEU, HGBUR, BILIRUBINUR, KETONESUR, PROTEINUR, UROBILINOGEN, NITRITE, LEUKOCYTESUR in the last 72 hours.  Invalid input(s): APPERANCEUR     Component Value Date/Time   CHOL 151 12/28/2023 0457   CHOL 197 02/16/2014 0826   TRIG 101 12/28/2023 0457   HDL 38 (L) 12/28/2023 0457   HDL 52 02/16/2014 0826   CHOLHDL 4.0 12/28/2023 0457   VLDL 20 12/28/2023 0457   LDLCALC 93 12/28/2023 0457   LDLCALC 127 (H) 02/16/2014 0826    Lab Results  Component Value Date   HGBA1C 5.5 12/28/2023   No results found for: LABOPIA, COCAINSCRNUR, LABBENZ, AMPHETMU, THCU, LABBARB  Recent Labs  Lab 12/27/23 1315  ETH <15    I have personally reviewed the radiological images below and agree with the radiology interpretations.  ECHOCARDIOGRAM COMPLETE Result Date: 12/28/2023    ECHOCARDIOGRAM REPORT   Patient Name:   Dana Pruitt Date of Exam: 12/28/2023 Medical Rec #:  [id number]       Height:       64.0 in Accession #:    [id number]      Weight:       118.0 lb Date of Birth:  1941/05/24      BSA:          1.563 m Patient Age:    82 years        BP:           156/79 mmHg Patient Gender: F               HR:           68 bpm. Exam Location:  Inpatient Procedure: 2D Echo, Cardiac Doppler and Color Doppler (Both Spectral and Color  Flow Doppler were utilized during procedure). Indications:    CHF- s/p AVR  History:        Patient has prior history of Echocardiogram examinations. CHF,                 Aortic Valve Disease; Risk Factors:Hypertension.  Sonographer:    Vella Key Referring Phys: [id number] CORTNEY E DE LA TORRE IMPRESSIONS  1. Left ventricular ejection fraction, by estimation, is 40 to 45%. The left ventricle has mildly decreased function. The left ventricle has no regional wall motion abnormalities. Left ventricular diastolic function could not be evaluated.  2. Right ventricular systolic function is low normal. The right ventricular size is normal.  3. Left atrial size was mildly dilated.  4. Right atrial size was mildly dilated.  5. The mitral valve is normal in structure. Moderate mitral valve regurgitation. No evidence of mitral stenosis.  6. Tricuspid valve regurgitation is moderate to severe.  7. The aortic valve has been repaired/replaced. Aortic valve regurgitation is not visualized. Aortic valve mean gradient measures 9.0 mmHg. Aortic valve Vmax measures 2.10 m/s. Aortic valve acceleration time measures  74 msec.  8. Aortic dilatation noted. There is mild dilatation of the aortic root, measuring 41 mm.  9. Severely dilated pulmonary artery. Comparison(s): Prior images reviewed side by side. Mitral regurgitation has increased. Prior study used echo-contrast for LVEF assessment; difficult comparison. FINDINGS  Left Ventricle: Left ventricular ejection fraction, by estimation, is 40 to 45%. The left ventricle has mildly decreased function. The left ventricle has no regional wall motion abnormalities. The left ventricular internal cavity size was normal in size. There is no left ventricular hypertrophy. Abnormal (paradoxical) septal motion consistent with post-operative status. Left ventricular diastolic function could not be evaluated due to mitral regurgitation (moderate or greater). Left ventricular diastolic function could not be evaluated. Right Ventricle: The right ventricular size is normal. No increase in right ventricular wall thickness. Right ventricular systolic function is low normal. Left Atrium: Left atrial size was mildly dilated. Right Atrium: Right atrial size was mildly dilated. Pericardium: There is no evidence of pericardial effusion. Mitral Valve: The mitral valve is normal in structure. Moderate mitral valve regurgitation. No evidence of mitral valve stenosis. Tricuspid Valve: The tricuspid valve is normal in structure. Tricuspid valve regurgitation is moderate to severe. No evidence of tricuspid stenosis. Aortic Valve: The aortic valve has been repaired/replaced. Aortic valve regurgitation is not visualized. Aortic valve mean gradient measures 9.0 mmHg. Aortic valve peak gradient measures 17.6 mmHg. There is a 21 mm bioprosthetic valve present in the aortic position. Pulmonic Valve: The pulmonic valve was normal in structure. Pulmonic valve regurgitation is mild. Aorta: Aortic dilatation noted. There is mild dilatation of the aortic root, measuring 41 mm. Pulmonary Artery: The pulmonary artery is  severely dilated. IAS/Shunts: The atrial septum is grossly normal.  LEFT VENTRICLE PLAX 2D LVIDd:         3.70 cm     Diastology LVIDs:         2.70 cm     LV e' medial:    4.57 cm/s LV PW:         0.90 cm     LV E/e' medial:  11.1 LV IVS:        0.90 cm     LV e' lateral:   8.59 cm/s LVOT diam:     1.60 cm     LV E/e' lateral: 5.9 LV SV:         33  LV SV Index:   21 LVOT Area:     2.01 cm  LV Volumes (MOD) LV vol d, MOD A2C: 93.9 ml LV vol d, MOD A4C: 93.3 ml LV vol s, MOD A2C: 35.0 ml LV vol s, MOD A4C: 40.0 ml LV SV MOD A2C:     58.9 ml LV SV MOD A4C:     93.3 ml LV SV MOD BP:      55.8 ml RIGHT VENTRICLE RV Basal diam:  3.90 cm RV S prime:     7.83 cm/s TAPSE (M-mode): 1.6 cm LEFT ATRIUM             Index        RIGHT ATRIUM           Index LA diam:        5.10 cm 3.26 cm/m   RA Area:     19.70 cm LA Vol (A2C):   45.6 ml 29.17 ml/m  RA Volume:   56.60 ml  36.20 ml/m LA Vol (A4C):   50.8 ml 32.49 ml/m LA Biplane Vol: 52.1 ml 33.33 ml/m  AORTIC VALVE AV Area (Vmax): 0.79 cm AV Vmax:        210.00 cm/s AV Peak Grad:   17.6 mmHg AV Mean Grad:   9.0 mmHg     PULMONARY ARTERY LVOT Vmax:      82.20 cm/s   MPA diam:        4.10 cm LVOT Vmean:     63.700 cm/s LVOT VTI:       0.166 m  AORTA Ao Root diam: 4.10 cm MITRAL VALVE               TRICUSPID VALVE MV Area (PHT): 4.65 cm    TR Peak grad:   42.8 mmHg MV Decel Time: 163 msec    TR Vmax:        327.00 cm/s MV E velocity: 50.80 cm/s MV A velocity: 75.50 cm/s  SHUNTS MV E/A ratio:  0.67        Systemic VTI:  0.17 m                            Systemic Diam: 1.60 cm Stanly Leavens MD Electronically signed by Stanly Leavens MD Signature Date/Time: 12/28/2023/1:46:59 PM    Final    VAS US  CAROTID (at [facility] and [facility] only) Result Date: 12/28/2023 Carotid Arterial Duplex Study Patient Name:  CHIA MOWERS  Date of Exam:   12/28/2023 Medical Rec #: [id number]        Accession #:    [id number] Date of Birth: [birth year]       Patient Gender: F Patient Age:   25  years Exam Location:  [facility] Procedure:      VAS US  CAROTID Referring Phys: MARSA MELVIN --------------------------------------------------------------------------------  Indications:      CVA. Risk Factors:     Hypertension, hyperlipidemia, coronary artery disease, prior                   CVA. Comparison Study: 06/18/21 - bilateral mild ICA plaque Performing Technologist: Ricka Sturdivant-Jones RDMS, RVT  Examination Guidelines: A complete evaluation includes B-mode imaging, spectral Doppler, color Doppler, and power Doppler as needed of all accessible portions of each vessel. Bilateral testing is considered an integral part of a complete examination. Limited examinations for reoccurring indications may be performed as noted.  Right Carotid Findings: +----------+--------+--------+--------+------------------+---------------------+  PSV cm/sEDV cm/sStenosisPlaque DescriptionComments              +----------+--------+--------+--------+------------------+---------------------+ CCA Prox  56      17                                                      +----------+--------+--------+--------+------------------+---------------------+ CCA Distal54      17                                                      +----------+--------+--------+--------+------------------+---------------------+ ICA Prox  59      20      1-39%   calcific          Velocities suggest                                                        low end of scale      +----------+--------+--------+--------+------------------+---------------------+ ICA Distal67      24                                                      +----------+--------+--------+--------+------------------+---------------------+ ECA       84      11                                                      +----------+--------+--------+--------+------------------+---------------------+  +----------+--------+-------+----------------+-------------------+           PSV cm/sEDV cmsDescribe        Arm Pressure (mmHG) +----------+--------+-------+----------------+-------------------+ [id number]             Multiphasic, WNL                    +----------+--------+-------+----------------+-------------------+ +---------+--------+--+--------+--+---------+ VertebralPSV cm/s62EDV cm/s21Antegrade +---------+--------+--+--------+--+---------+  Left Carotid Findings: +----------+--------+--------+--------+------------------+--------+           PSV cm/sEDV cm/sStenosisPlaque DescriptionComments +----------+--------+--------+--------+------------------+--------+ CCA Prox  69      14                                         +----------+--------+--------+--------+------------------+--------+ CCA Distal50      13              calcific                   +----------+--------+--------+--------+------------------+--------+ ICA Prox  44      18                                tortuous +----------+--------+--------+--------+------------------+--------+ ICA Distal53      19                                         +----------+--------+--------+--------+------------------+--------+  ECA       49      10                                         +----------+--------+--------+--------+------------------+--------+ +----------+--------+--------+----------------+-------------------+           PSV cm/sEDV cm/sDescribe        Arm Pressure (mmHG) +----------+--------+--------+----------------+-------------------+ Dlarojcpjw02              Multiphasic, WNL                    +----------+--------+--------+----------------+-------------------+ +---------+--------+--+--------+--+---------+ VertebralPSV cm/s50EDV cm/s14Antegrade +---------+--------+--+--------+--+---------+   Summary: Right Carotid: Velocities in the right ICA are consistent with a 1-39%  stenosis. Left Carotid: The extracranial vessels were near-normal with only minimal wall               thickening or plaque. Vertebrals:  Bilateral vertebral arteries demonstrate antegrade flow. Subclavians: Normal flow hemodynamics were seen in bilateral subclavian              arteries. *See table(s) above for measurements and observations.  Electronically signed by Eather Popp MD on 12/28/2023 at 1:02:44 PM.    Final    MR BRAIN WO CONTRAST Result Date: 12/27/2023 CLINICAL DATA:  Stroke follow-up. EXAM: MRI HEAD WITHOUT CONTRAST MRA HEAD WITHOUT CONTRAST TECHNIQUE: Multiplanar, multi-echo pulse sequences of the brain and surrounding structures were acquired without intravenous contrast. Angiographic images of the Circle of Willis were acquired using MRA technique without intravenous contrast. COMPARISON:  CT head earlier same day. CTA head and neck 12/20/2021. FINDINGS: MRI HEAD FINDINGS Brain: 3.4 cm region of restricted diffusion in the left frontal lobe involving the cortex and subcortical white matter corresponding to findings on CT compatible with acute infarct. No evidence of intracranial hemorrhage. T2/FLAIR hyperintensity in the periventricular and subcortical white matter as well as within the pons suggestive of chronic microvascular ischemic changes. Mild parenchymal volume loss. No midline shift. The basilar cisterns are patent. No extra-axial fluid collections. Ventricles: Normal size and configuration of the ventricles. Vascular: Skull base flow voids are visualized. Skull and upper cervical spine: No focal abnormality. Sinuses/Orbits: Orbits are symmetric. Paranasal sinuses are clear. Other: Mastoid air cells are clear. MRA HEAD FINDINGS Anterior circulation: The intracranial internal carotid arteries are patent bilaterally. There is mild atherosclerotic plaque along the carotid siphons without high-grade stenosis. The right M1 segment is patent. Right MCA bifurcation and proximal right M2 branches  are patent. Distal right MCA branches appear patent. Left M1 segment is patent. The left MCA bifurcation and proximal M2 branches are patent. Distal left MCA branches appear patent. The A1 and A2 segments are patent bilaterally. Symmetric appearance of visualized distal ACA branches. Posterior circulation: The intracranial vertebral arteries are patent bilaterally. The basilar artery is patent. Posterior cerebral arteries are patent bilaterally. The superior cerebellar arteries are patent proximally. Posterior communicating arteries visualized bilaterally. The AICA and PICA is are visualized bilaterally. Anatomic variants: None significant. IMPRESSION: Acute infarct in the left frontal lobe corresponding to region of abnormality on CT. Moderate chronic microvascular ischemic changes and mild parenchymal volume loss. Patent intracranial arterial vasculature. Electronically Signed   By: Donnice Mania M.D.   On: 12/27/2023 15:53   MR ANGIO HEAD WO CONTRAST Result Date: 12/27/2023 CLINICAL DATA:  Stroke follow-up. EXAM: MRI HEAD WITHOUT CONTRAST MRA HEAD WITHOUT CONTRAST TECHNIQUE: Multiplanar, multi-echo pulse sequences of the  brain and surrounding structures were acquired without intravenous contrast. Angiographic images of the Circle of Willis were acquired using MRA technique without intravenous contrast. COMPARISON:  CT head earlier same day. CTA head and neck 12/20/2021. FINDINGS: MRI HEAD FINDINGS Brain: 3.4 cm region of restricted diffusion in the left frontal lobe involving the cortex and subcortical white matter corresponding to findings on CT compatible with acute infarct. No evidence of intracranial hemorrhage. T2/FLAIR hyperintensity in the periventricular and subcortical white matter as well as within the pons suggestive of chronic microvascular ischemic changes. Mild parenchymal volume loss. No midline shift. The basilar cisterns are patent. No extra-axial fluid collections. Ventricles: Normal size and  configuration of the ventricles. Vascular: Skull base flow voids are visualized. Skull and upper cervical spine: No focal abnormality. Sinuses/Orbits: Orbits are symmetric. Paranasal sinuses are clear. Other: Mastoid air cells are clear. MRA HEAD FINDINGS Anterior circulation: The intracranial internal carotid arteries are patent bilaterally. There is mild atherosclerotic plaque along the carotid siphons without high-grade stenosis. The right M1 segment is patent. Right MCA bifurcation and proximal right M2 branches are patent. Distal right MCA branches appear patent. Left M1 segment is patent. The left MCA bifurcation and proximal M2 branches are patent. Distal left MCA branches appear patent. The A1 and A2 segments are patent bilaterally. Symmetric appearance of visualized distal ACA branches. Posterior circulation: The intracranial vertebral arteries are patent bilaterally. The basilar artery is patent. Posterior cerebral arteries are patent bilaterally. The superior cerebellar arteries are patent proximally. Posterior communicating arteries visualized bilaterally. The AICA and PICA is are visualized bilaterally. Anatomic variants: None significant. IMPRESSION: Acute infarct in the left frontal lobe corresponding to region of abnormality on CT. Moderate chronic microvascular ischemic changes and mild parenchymal volume loss. Patent intracranial arterial vasculature. Electronically Signed   By: Donnice Mania M.D.   On: 12/27/2023 15:53   DG Chest Portable 1 View Result Date: 12/27/2023 CLINICAL DATA:  Chronic obstructive pulmonary disease. EXAM: PORTABLE CHEST 1 VIEW COMPARISON:  Radiographs 10/24/2023 and 10/23/2023. Chest CT 10/21/2023. FINDINGS: 1345 hours. The heart size and mediastinal contours are stable status post median sternotomy and aortic valve replacement. There is aortic atherosclerosis. Patchy airspace disease seen at the left lung base on the most recent study has cleared in the interval. The  lungs are mildly hyperinflated with scattered mild scarring. No new airspace disease, pleural effusion or pneumothorax. The bones appear unchanged. IMPRESSION: Interval clearing of left basilar airspace disease. No acute cardiopulmonary process. Electronically Signed   By: Elsie Perone M.D.   On: 12/27/2023 14:01   CT HEAD CODE STROKE WO CONTRAST Addendum Date: 12/27/2023 ADDENDUM REPORT: 12/27/2023 13:43 ADDENDUM: Impression #1 called by telephone at the time of interpretation on 12/27/2023 at 1:42 pm to provider Dr. Jerrie, who verbally acknowledged these results. Electronically Signed   By: Rockey Childs D.O.   On: 12/27/2023 13:43   Result Date: 12/27/2023 CLINICAL DATA:  Code stroke. Provided history: Neuro deficit, acute, stroke suspected. Aphasia. EXAM: CT HEAD WITHOUT CONTRAST TECHNIQUE: Contiguous axial images were obtained from the base of the skull through the vertex without intravenous contrast. RADIATION DOSE REDUCTION: This exam was performed according to the departmental dose-optimization program which includes automated exposure control, adjustment of the mA and/or kV according to patient size and/or use of iterative reconstruction technique. COMPARISON:  Non-contrast head CT and CT angiogram head/neck 12/20/2021. Brain MRI 12/20/2021. FINDINGS: Brain: Generalized cerebral atrophy. Acute or subacute cortical/subcortical infarct within the mid left frontal lobe (MCA territory), new  from the prior head CT of 12/20/2021. The infarct spans 3.4 cm. A known chronic cortical infarct within the left occipital lobe was better appreciated on the prior brain MRI of 12/20/2021 (acute at that time). Background moderate patchy and ill-defined hypoattenuation within the cerebral white matter, nonspecific but compatible chronic small vessel ischemic disease. There is no acute intracranial hemorrhage. No extra-axial fluid collection. No evidence of an intracranial mass. No midline shift. Vascular: No hyperdense  vessel.  Atherosclerotic calcifications. Skull: No calvarial fracture or aggressive osseous lesion. Sinuses/Orbits: No mass or acute finding within the imaged orbits. No significant paranasal sinus disease at the imaged levels. ASPECTS (Alberta Stroke Program Early CT Score) - Ganglionic level infarction (caudate, lentiform nuclei, internal capsule, insula, M1-M3 cortex): 2 - Supraganglionic infarction (M4-M6 cortex): 6 Total score (0-10 with 10 being normal): 8 Attempts are being made to reach the ordering provider at this time. IMPRESSION: 1. 3.4 cm acute or subacute cortical/subcortical infarct within the mid left frontal lobe (MCA vascular territory), new from the prior head CT of 12/20/2021. ASPECTS is 8. 2. Background parenchymal atrophy, chronic small vessel ischemic disease and chronic left occipital lobe infarct, as described. Electronically Signed: By: Rockey Childs D.O. On: 12/27/2023 13:39     PHYSICAL EXAM  Temp:  [97.7 F (36.5 C)-98.5 F (36.9 C)] 98 F (36.7 C) (07/08 1157) Pulse Rate:  [65-69] 69 (07/08 1157) Resp:  [18-21] 20 (07/08 1157) BP: (124-156)/(66-81) 137/81 (07/08 1157) SpO2:  [92 %-98 %] 95 % (07/08 1157)  General - Well nourished, well developed, in no apparent distress.  Ophthalmologic - fundi not visualized due to noncooperation.  Cardiovascular - Regular rhythm and rate.  Mental Status -  Level of arousal and orientation to time, place, and person were intact. Language including expression, naming, repetition, comprehension was assessed and found intact. Fund of Knowledge was assessed and was intact.  Cranial Nerves II - XII - II - Visual field intact OU. III, IV, VI - Extraocular movements intact. V - Facial sensation intact bilaterally. VII - Facial movement intact bilaterally. VIII - Hearing & vestibular intact bilaterally. X - Palate elevates symmetrically. XI - Chin turning & shoulder shrug intact bilaterally. XII - Tongue protrusion  intact.  Motor Strength - The patient's strength was normal in all extremities and pronator drift was absent.  Bulk was normal and fasciculations were absent.   Motor Tone - Muscle tone was assessed at the neck and appendages and was normal.  Reflexes - The patient's reflexes were symmetrical in all extremities and she had no pathological reflexes.  Sensory - Light touch, temperature/pinprick were assessed and were symmetrical.    Coordination - The patient had normal movements in the hands with no ataxia or dysmetria.  Tremor was absent.  Gait and Station - deferred.   ASSESSMENT/PLAN Ms. YAZMYN VALBUENA is a 83 y.o. female with history of AV insufficiency status post AVR, CHF, cardiomyopathy, PAF on Eliquis , COPD, hypertension, hyperlipidemia, anxiety, depression, breast cancer in 2013, CKD 3 admitted for aphasia. No TNK given due to on Eliquis .    Stroke:  left frontal infarct embolic secondary to A-fib even on Eliquis  CT left frontal infarct MRI confirmed left frontal infarct MRA unremarkable Carotid Doppler unremarkable Patient prefer outpatient pan CT to rule out advanced malignancy 2D Echo EF 40 to 45% LDL 93 HgbA1c 5.5 Eliquis  for VTE prophylaxis Eliquis  2.5 twice daily prior to admission, now on home Eliquis  2.5 twice daily.  Patient counseled to be compliant with  her antithrombotic medications Ongoing aggressive stroke risk factor management Therapy recommendations: Home health PT Disposition: Home  PAF Cardiomyopathy Status post AVR Home Eliquis  2.5 mg twice daily Per patient, compliant with Eliquis  Rate controlled On amiodarone  Continue home Eliquis  If malignancy ruled out, may consider watchman device if recurrent embolic stroke.  Hypertension Stable Long term BP goal normotensive  Hyperlipidemia Home meds: Lipitor 20 and Zetia  10 LDL 93, goal < 70 Now on home Lipitor 20 and Zetia  10 No high intensity statin given advanced age and LDL not far from  goal Continue statin at discharge  Other Stroke Risk Factors Advanced age Cardiomyopathy  Other Active Problems Anxiety/depression CKD 3A, creatinine 1.33--1.40--1.25 COPD Breast cancer in 2013, has been cancer free  Hospital day # 0  Neurology will sign off. Please call with questions. Pt will follow up with stroke clinic Dr. Rosemarie at [facility] in about 4 weeks. Thanks for the consult.   Ary Cummins, MD PhD Stroke Neurology 12/28/2023 4:00 PM    To contact Stroke Continuity provider, please refer to [email or web address]. After hours, contact General Neurology

## 2023-12-28 NOTE — Discharge Instructions (Addendum)
 Please call radiology to schedule your CT scan as per neurology recommendations: [phone]   Neurology will contact you for follow up appointment.

## 2023-12-28 NOTE — Discharge Summary (Signed)
 Physician Discharge Summary   Dana Pruitt FMW:[id number] DOB: 08/30/1940 DOA: 12/27/2023  PCP: Clarice Nottingham, MD  Admit date: 12/27/2023 Discharge date: 12/28/2023  Admitted From: Home Disposition:  Home Discharging physician: Alm Apo, MD Barriers to discharge: none  Recommendations at discharge: Obtain CT C/A/P Follow up with neurology; address Eliquis  and/or if needs increased dosing vs alternative DOAC   Discharge Condition: stable CODE STATUS: Full  Diet recommendation:  Diet Orders (From admission, onward)     Start     Ordered   12/28/23 0000  Diet general        12/28/23 1425   12/27/23 1730  Diet regular Fluid consistency: Thin  Diet effective now       Question:  Fluid consistency:  Answer:  Thin   12/27/23 1729            Hospital Course: [name] is an 83 yo female with PMH right breast cancer, A-fib on Eliquis , COPD, chronic hypoxia on 5 L oxygen , iron deficiency anemia, HTN, HLD, CHF, aortic insufficiency s/p aortic valve replacement, depression/anxiety who presented with aphasia. She was able to understand what people were asking but when trying to respond she could not get her words out.  She was at a cardiology appointment for follow-up visit when this happened and was referred to the ER immediately.  MRI brain was positive for acute infarct involving left frontal lobe. Her symptoms appear to sound consistent with Broca's aphasia and correlate with stroke location on MRI. Symptoms had started approximately 11 AM on 12/27/2023 and had resolved approximately around dinnertime that same day per patient.  She has been compliant on Eliquis  with no missed doses.  She is on reduced dosing, 2.5 mg twice daily in setting of her age and weight. Carotid ultrasound negative for significant stenosis.  Atrial septum noted to be normal on echo.  Neurology recommended CT chest/abdomen/pelvis to evaluate for any potential cancer recurrence given stroke while on  anticoagulation. Patient desired to obtain CT as outpatient in order to facilitate discharge from the hospital.  This was ordered and patient understands to call radiology at discharge to schedule. No medication changes made at discharge and she will also follow-up with neurology outpatient.    The patient's acute and chronic medical conditions were treated accordingly. On day of discharge, patient was felt deemed stable for discharge. Patient/family member advised to call PCP or come back to ER if needed.   Principal Diagnosis: Acute CVA (cerebrovascular accident) [facility])  Discharge Diagnoses: Active Hospital Problems   Diagnosis Date Noted   Acute CVA (cerebrovascular accident) (HCC) 12/27/2023    Priority: 1.   PAF (paroxysmal atrial fibrillation) (HCC) 02/09/2019    Priority: 2.   Chronic respiratory failure with hypoxia (HCC) 04/19/2019    Priority: 3.   Chronic systolic CHF (congestive heart failure) (HCC) 12/16/2018    Priority: 4.   COPD  GOLD II 11/22/2012    Priority: 4.   Essential hypertension 04/19/2019    Priority: 5.   HLD (hyperlipidemia) 03/28/2014    Priority: 5.   Depression 10/17/2012    Priority: 5.   History of CVA (cerebrovascular accident) 12/27/2023   CAD (coronary artery disease) 12/27/2023   Anemia of chronic disease 12/27/2023   Aortic valve disease 01/31/2019   Primary cancer of upper outer quadrant of right female breast (HCC) 02/27/2010    Resolved Hospital Problems  No resolved problems to display.     Discharge Instructions     Diet general  Complete by: As directed    Increase activity slowly   Complete by: As directed       Allergies as of 12/28/2023   No Known Allergies      Medication List     TAKE these medications    albuterol  (2.5 MG/3ML) 0.083% nebulizer solution Commonly known as: PROVENTIL  Take 3 mLs (2.5 mg total) by nebulization every 6 (six) hours as needed for wheezing or shortness of breath.   albuterol  108 (90  Base) MCG/ACT inhaler Commonly known as: VENTOLIN  HFA Inhale 2 puffs into the lungs every 6 (six) hours as needed for wheezing or shortness of breath (4 times daily). INHALE 2 PUFFS BY MOUTH EVERY 4 HOURS AS NEEDED ONLY IF YOU CAN'T CATCH YOUR BREATH   amiodarone  200 MG tablet Commonly known as: PACERONE  Take 1 tablet (200 mg total) by mouth daily.   apixaban  2.5 MG Tabs tablet Commonly known as: Eliquis  Take 1 tablet (2.5 mg total) by mouth 2 (two) times daily.   atorvastatin  20 MG tablet Commonly known as: LIPITOR Take 20 mg by mouth daily.   bisoprolol  5 MG tablet Commonly known as: ZEBETA  Take 0.5 tablets (2.5 mg total) by mouth daily.   Breztri  Aerosphere 160-9-4.8 MCG/ACT Aero inhaler Generic drug: budesonide -glycopyrrolate -formoterol  INHALE 2 PUFFS INTO THE LUNGS TWICE DAILY   carvedilol  12.5 MG tablet Commonly known as: COREG  Take 12.5 mg by mouth 2 (two) times daily with a meal.   ezetimibe  10 MG tablet Commonly known as: ZETIA  Take 10 mg by mouth daily.   FeroSul 325 (65 Fe) MG tablet Generic drug: ferrous sulfate  Take 325 mg by mouth every other day.   fluticasone  50 MCG/ACT nasal spray Commonly known as: FLONASE  SHAKE LIQUID AND USE 2 SPRAYS IN EACH NOSTRIL DAILY   furosemide  20 MG tablet Commonly known as: LASIX  Take 20 mg by mouth daily.   guaiFENesin  600 MG 12 hr tablet Commonly known as: MUCINEX  Take 1 tablet (600 mg total) by mouth 2 (two) times daily.   guaiFENesin -dextromethorphan  100-10 MG/5ML syrup Commonly known as: ROBITUSSIN DM Take 5 mLs by mouth every 4 (four) hours as needed for cough.   ipratropium 0.06 % nasal spray Commonly known as: ATROVENT  Place 2 sprays into both nostrils 4 (four) times daily.   losartan  25 MG tablet Commonly known as: COZAAR  Take 25 mg by mouth daily.   OXYGEN  Inhale 5 L/min into the lungs continuous.   pantoprazole  40 MG tablet Commonly known as: PROTONIX  Take 40 mg by mouth daily.   potassium  chloride 10 MEQ tablet Commonly known as: KLOR-CON  M Take 10 mEq by mouth daily.   sertraline  100 MG tablet Commonly known as: ZOLOFT  Take 200 mg by mouth daily.   Vitamin D3 25 MCG (1000 UT) Caps Take 1,000 Units by mouth daily.        No Known Allergies  Consultations: Neurology  Procedures:   Discharge Exam: BP 137/81 (BP Location: Left Arm)   Pulse 69   Temp 98 F (36.7 C) (Oral)   Resp 20   Ht 5' 4 (1.626 m)   Wt 53.5 kg   LMP  (LMP Unknown)   SpO2 95%   BMI 20.25 kg/m  Physical Exam Constitutional:      Appearance: Normal appearance.  HENT:     Head: Normocephalic and atraumatic.     Mouth/Throat:     Mouth: Mucous membranes are moist.  Eyes:     Extraocular Movements: Extraocular movements intact.  Cardiovascular:  Rate and Rhythm: Normal rate and regular rhythm.  Pulmonary:     Effort: Pulmonary effort is normal. No respiratory distress.     Breath sounds: Normal breath sounds. No wheezing.  Abdominal:     General: Bowel sounds are normal. There is no distension.     Palpations: Abdomen is soft.     Tenderness: There is no abdominal tenderness.  Musculoskeletal:        General: Normal range of motion.     Cervical back: Normal range of motion and neck supple.  Skin:    General: Skin is warm and dry.  Neurological:     General: No focal deficit present.     Mental Status: She is alert.  Psychiatric:        Mood and Affect: Mood normal.      The results of significant diagnostics from this hospitalization (including imaging, microbiology, ancillary and laboratory) are listed below for reference.   Microbiology: No results found for this or any previous visit (from the past 240 hours).   Labs: BNP (last 3 results) Recent Labs    09/13/23 1534 10/21/23 1323 10/23/23 1011  BNP 905.0* 758.3* 928.4*   Basic Metabolic Panel: Recent Labs  Lab 12/27/23 1307 12/27/23 1321 12/28/23 0457  NA 140 137 140  K 4.1 4.1 4.2  CL 102 104  106  CO2 27  --  25  GLUCOSE 133* 130* 106*  BUN 17 20 18   CREATININE 1.33* 1.40* 1.25*  CALCIUM  9.7  --  9.3   Liver Function Tests: Recent Labs  Lab 12/27/23 1307 12/28/23 0457  AST 21 16  ALT 18 14  ALKPHOS 38 30*  BILITOT 0.7 0.4  PROT 6.2* 5.1*  ALBUMIN  3.8 3.1*   No results for input(s): LIPASE, AMYLASE in the last 168 hours. No results for input(s): AMMONIA in the last 168 hours. CBC: Recent Labs  Lab 12/27/23 1307 12/27/23 1321 12/28/23 0457  WBC 6.7  --  5.1  NEUTROABS 5.3  --   --   HGB 10.2* 9.9* 8.9*  HCT 32.8* 29.0* 27.8*  MCV 98.2  --  96.2  PLT 226  --  199   Cardiac Enzymes: No results for input(s): CKTOTAL, CKMB, CKMBINDEX, TROPONINI in the last 168 hours. BNP: Invalid input(s): POCBNP CBG: Recent Labs  Lab 12/27/23 1314  GLUCAP 124*   D-Dimer No results for input(s): DDIMER in the last 72 hours. Hgb A1c Recent Labs    12/28/23 0457  HGBA1C 5.5   Lipid Profile Recent Labs    12/28/23 0457  CHOL 151  HDL 38*  LDLCALC 93  TRIG 898  CHOLHDL 4.0   Thyroid  function studies No results for input(s): TSH, T4TOTAL, T3FREE, THYROIDAB in the last 72 hours.  Invalid input(s): FREET3 Anemia work up No results for input(s): VITAMINB12, FOLATE, FERRITIN, TIBC, IRON, RETICCTPCT in the last 72 hours. Urinalysis    Component Value Date/Time   COLORURINE YELLOW 09/13/2023 1501   APPEARANCEUR CLEAR 09/13/2023 1501   LABSPEC 1.012 09/13/2023 1501   PHURINE 5.0 09/13/2023 1501   GLUCOSEU NEGATIVE 09/13/2023 1501   HGBUR SMALL (A) 09/13/2023 1501   BILIRUBINUR NEGATIVE 09/13/2023 1501   KETONESUR 20 (A) 09/13/2023 1501   PROTEINUR NEGATIVE 09/13/2023 1501   UROBILINOGEN 0.2 10/17/2012 1738   NITRITE NEGATIVE 09/13/2023 1501   LEUKOCYTESUR NEGATIVE 09/13/2023 1501   Sepsis Labs Recent Labs  Lab 12/27/23 1307 12/28/23 0457  WBC 6.7 5.1   Microbiology No results found for this  or any previous  visit (from the past 240 hours).  Procedures/Studies: ECHOCARDIOGRAM COMPLETE Result Date: 12/28/2023    ECHOCARDIOGRAM REPORT   Patient Name:   Dana Pruitt Date of Exam: 12/28/2023 Medical Rec #:  [id number]       Height:       64.0 in Accession #:    [id number]      Weight:       118.0 lb Date of Birth:  [birth year]      BSA:          1.563 m Patient Age:    82 years        BP:           156/79 mmHg Patient Gender: F               HR:           68 bpm. Exam Location:  Inpatient Procedure: 2D Echo, Cardiac Doppler and Color Doppler (Both Spectral and Color            Flow Doppler were utilized during procedure). Indications:    CHF- s/p AVR  History:        Patient has prior history of Echocardiogram examinations. CHF,                 Aortic Valve Disease; Risk Factors:Hypertension.  Sonographer:    Vella Key Referring Phys: [id number] CORTNEY E DE LA TORRE IMPRESSIONS  1. Left ventricular ejection fraction, by estimation, is 40 to 45%. The left ventricle has mildly decreased function. The left ventricle has no regional wall motion abnormalities. Left ventricular diastolic function could not be evaluated.  2. Right ventricular systolic function is low normal. The right ventricular size is normal.  3. Left atrial size was mildly dilated.  4. Right atrial size was mildly dilated.  5. The mitral valve is normal in structure. Moderate mitral valve regurgitation. No evidence of mitral stenosis.  6. Tricuspid valve regurgitation is moderate to severe.  7. The aortic valve has been repaired/replaced. Aortic valve regurgitation is not visualized. Aortic valve mean gradient measures 9.0 mmHg. Aortic valve Vmax measures 2.10 m/s. Aortic valve acceleration time measures 74 msec.  8. Aortic dilatation noted. There is mild dilatation of the aortic root, measuring 41 mm.  9. Severely dilated pulmonary artery. Comparison(s): Prior images reviewed side by side. Mitral regurgitation has increased. Prior study used echo-contrast  for LVEF assessment; difficult comparison. FINDINGS  Left Ventricle: Left ventricular ejection fraction, by estimation, is 40 to 45%. The left ventricle has mildly decreased function. The left ventricle has no regional wall motion abnormalities. The left ventricular internal cavity size was normal in size. There is no left ventricular hypertrophy. Abnormal (paradoxical) septal motion consistent with post-operative status. Left ventricular diastolic function could not be evaluated due to mitral regurgitation (moderate or greater). Left ventricular diastolic function could not be evaluated. Right Ventricle: The right ventricular size is normal. No increase in right ventricular wall thickness. Right ventricular systolic function is low normal. Left Atrium: Left atrial size was mildly dilated. Right Atrium: Right atrial size was mildly dilated. Pericardium: There is no evidence of pericardial effusion. Mitral Valve: The mitral valve is normal in structure. Moderate mitral valve regurgitation. No evidence of mitral valve stenosis. Tricuspid Valve: The tricuspid valve is normal in structure. Tricuspid valve regurgitation is moderate to severe. No evidence of tricuspid stenosis. Aortic Valve: The aortic valve has been repaired/replaced. Aortic valve regurgitation is not visualized. Aortic valve mean gradient  measures 9.0 mmHg. Aortic valve peak gradient measures 17.6 mmHg. There is a 21 mm bioprosthetic valve present in the aortic position. Pulmonic Valve: The pulmonic valve was normal in structure. Pulmonic valve regurgitation is mild. Aorta: Aortic dilatation noted. There is mild dilatation of the aortic root, measuring 41 mm. Pulmonary Artery: The pulmonary artery is severely dilated. IAS/Shunts: The atrial septum is grossly normal.  LEFT VENTRICLE PLAX 2D LVIDd:         3.70 cm     Diastology LVIDs:         2.70 cm     LV e' medial:    4.57 cm/s LV PW:         0.90 cm     LV E/e' medial:  11.1 LV IVS:        0.90 cm      LV e' lateral:   8.59 cm/s LVOT diam:     1.60 cm     LV E/e' lateral: 5.9 LV SV:         33 LV SV Index:   21 LVOT Area:     2.01 cm  LV Volumes (MOD) LV vol d, MOD A2C: 93.9 ml LV vol d, MOD A4C: 93.3 ml LV vol s, MOD A2C: 35.0 ml LV vol s, MOD A4C: 40.0 ml LV SV MOD A2C:     58.9 ml LV SV MOD A4C:     93.3 ml LV SV MOD BP:      55.8 ml RIGHT VENTRICLE RV Basal diam:  3.90 cm RV S prime:     7.83 cm/s TAPSE (M-mode): 1.6 cm LEFT ATRIUM             Index        RIGHT ATRIUM           Index LA diam:        5.10 cm 3.26 cm/m   RA Area:     19.70 cm LA Vol (A2C):   45.6 ml 29.17 ml/m  RA Volume:   56.60 ml  36.20 ml/m LA Vol (A4C):   50.8 ml 32.49 ml/m LA Biplane Vol: 52.1 ml 33.33 ml/m  AORTIC VALVE AV Area (Vmax): 0.79 cm AV Vmax:        210.00 cm/s AV Peak Grad:   17.6 mmHg AV Mean Grad:   9.0 mmHg     PULMONARY ARTERY LVOT Vmax:      82.20 cm/s   MPA diam:        4.10 cm LVOT Vmean:     63.700 cm/s LVOT VTI:       0.166 m  AORTA Ao Root diam: 4.10 cm MITRAL VALVE               TRICUSPID VALVE MV Area (PHT): 4.65 cm    TR Peak grad:   42.8 mmHg MV Decel Time: 163 msec    TR Vmax:        327.00 cm/s MV E velocity: 50.80 cm/s MV A velocity: 75.50 cm/s  SHUNTS MV E/A ratio:  0.67        Systemic VTI:  0.17 m                            Systemic Diam: 1.60 cm Stanly Leavens MD Electronically signed by Stanly Leavens MD Signature Date/Time: 12/28/2023/1:46:59 PM    Final    VAS US  CAROTID (at [facility] and [facility] only) Result Date:  12/28/2023 Carotid Arterial Duplex Study Patient Name:  Dana Pruitt  Date of Exam:   12/28/2023 Medical Rec #: [id number]        Accession #:    [id number] Date of Birth: Dec 08, 1940       Patient Gender: F Patient Age:   44 years Exam Location:  [facility] Procedure:      VAS US  CAROTID Referring Phys: MARSA MELVIN --------------------------------------------------------------------------------  Indications:      CVA. Risk Factors:     Hypertension, hyperlipidemia,  coronary artery disease, prior                   CVA. Comparison Study: 06/18/21 - bilateral mild ICA plaque Performing Technologist: Ricka Sturdivant-Jones RDMS, RVT  Examination Guidelines: A complete evaluation includes B-mode imaging, spectral Doppler, color Doppler, and power Doppler as needed of all accessible portions of each vessel. Bilateral testing is considered an integral part of a complete examination. Limited examinations for reoccurring indications may be performed as noted.  Right Carotid Findings: +----------+--------+--------+--------+------------------+---------------------+           PSV cm/sEDV cm/sStenosisPlaque DescriptionComments              +----------+--------+--------+--------+------------------+---------------------+ CCA Prox  56      17                                                      +----------+--------+--------+--------+------------------+---------------------+ CCA Distal54      17                                                      +----------+--------+--------+--------+------------------+---------------------+ ICA Prox  59      20      1-39%   calcific          Velocities suggest                                                        low end of scale      +----------+--------+--------+--------+------------------+---------------------+ ICA Distal67      24                                                      +----------+--------+--------+--------+------------------+---------------------+ ECA       84      11                                                      +----------+--------+--------+--------+------------------+---------------------+ +----------+--------+-------+----------------+-------------------+           PSV cm/sEDV cmsDescribe        Arm Pressure (mmHG) +----------+--------+-------+----------------+-------------------+ [id number]             Multiphasic, WNL                     +----------+--------+-------+----------------+-------------------+ +---------+--------+--+--------+--+---------+  VertebralPSV cm/s62EDV cm/s21Antegrade +---------+--------+--+--------+--+---------+  Left Carotid Findings: +----------+--------+--------+--------+------------------+--------+           PSV cm/sEDV cm/sStenosisPlaque DescriptionComments +----------+--------+--------+--------+------------------+--------+ CCA Prox  69      14                                         +----------+--------+--------+--------+------------------+--------+ CCA Distal50      13              calcific                   +----------+--------+--------+--------+------------------+--------+ ICA Prox  44      18                                tortuous +----------+--------+--------+--------+------------------+--------+ ICA Distal53      19                                         +----------+--------+--------+--------+------------------+--------+ ECA       49      10                                         +----------+--------+--------+--------+------------------+--------+ +----------+--------+--------+----------------+-------------------+           PSV cm/sEDV cm/sDescribe        Arm Pressure (mmHG) +----------+--------+--------+----------------+-------------------+ [id number]              Multiphasic, WNL                    +----------+--------+--------+----------------+-------------------+ +---------+--------+--+--------+--+---------+ VertebralPSV cm/s50EDV cm/s14Antegrade +---------+--------+--+--------+--+---------+   Summary: Right Carotid: Velocities in the right ICA are consistent with a 1-39% stenosis. Left Carotid: The extracranial vessels were near-normal with only minimal wall               thickening or plaque. Vertebrals:  Bilateral vertebral arteries demonstrate antegrade flow. Subclavians: Normal flow hemodynamics were seen in bilateral subclavian               arteries. *See table(s) above for measurements and observations.  Electronically signed by Eather Popp MD on 12/28/2023 at 1:02:44 PM.    Final    MR BRAIN WO CONTRAST Result Date: 12/27/2023 CLINICAL DATA:  Stroke follow-up. EXAM: MRI HEAD WITHOUT CONTRAST MRA HEAD WITHOUT CONTRAST TECHNIQUE: Multiplanar, multi-echo pulse sequences of the brain and surrounding structures were acquired without intravenous contrast. Angiographic images of the Circle of Willis were acquired using MRA technique without intravenous contrast. COMPARISON:  CT head earlier same day. CTA head and neck 12/20/2021. FINDINGS: MRI HEAD FINDINGS Brain: 3.4 cm region of restricted diffusion in the left frontal lobe involving the cortex and subcortical white matter corresponding to findings on CT compatible with acute infarct. No evidence of intracranial hemorrhage. T2/FLAIR hyperintensity in the periventricular and subcortical white matter as well as within the pons suggestive of chronic microvascular ischemic changes. Mild parenchymal volume loss. No midline shift. The basilar cisterns are patent. No extra-axial fluid collections. Ventricles: Normal size and configuration of the ventricles. Vascular: Skull base flow voids are visualized. Skull and upper cervical spine: No focal abnormality. Sinuses/Orbits: Orbits are symmetric. Paranasal sinuses are clear.  Other: Mastoid air cells are clear. MRA HEAD FINDINGS Anterior circulation: The intracranial internal carotid arteries are patent bilaterally. There is mild atherosclerotic plaque along the carotid siphons without high-grade stenosis. The right M1 segment is patent. Right MCA bifurcation and proximal right M2 branches are patent. Distal right MCA branches appear patent. Left M1 segment is patent. The left MCA bifurcation and proximal M2 branches are patent. Distal left MCA branches appear patent. The A1 and A2 segments are patent bilaterally. Symmetric appearance of visualized distal ACA  branches. Posterior circulation: The intracranial vertebral arteries are patent bilaterally. The basilar artery is patent. Posterior cerebral arteries are patent bilaterally. The superior cerebellar arteries are patent proximally. Posterior communicating arteries visualized bilaterally. The AICA and PICA is are visualized bilaterally. Anatomic variants: None significant. IMPRESSION: Acute infarct in the left frontal lobe corresponding to region of abnormality on CT. Moderate chronic microvascular ischemic changes and mild parenchymal volume loss. Patent intracranial arterial vasculature. Electronically Signed   By: Donnice Mania M.D.   On: 12/27/2023 15:53   MR ANGIO HEAD WO CONTRAST Result Date: 12/27/2023 CLINICAL DATA:  Stroke follow-up. EXAM: MRI HEAD WITHOUT CONTRAST MRA HEAD WITHOUT CONTRAST TECHNIQUE: Multiplanar, multi-echo pulse sequences of the brain and surrounding structures were acquired without intravenous contrast. Angiographic images of the Circle of Willis were acquired using MRA technique without intravenous contrast. COMPARISON:  CT head earlier same day. CTA head and neck 12/20/2021. FINDINGS: MRI HEAD FINDINGS Brain: 3.4 cm region of restricted diffusion in the left frontal lobe involving the cortex and subcortical white matter corresponding to findings on CT compatible with acute infarct. No evidence of intracranial hemorrhage. T2/FLAIR hyperintensity in the periventricular and subcortical white matter as well as within the pons suggestive of chronic microvascular ischemic changes. Mild parenchymal volume loss. No midline shift. The basilar cisterns are patent. No extra-axial fluid collections. Ventricles: Normal size and configuration of the ventricles. Vascular: Skull base flow voids are visualized. Skull and upper cervical spine: No focal abnormality. Sinuses/Orbits: Orbits are symmetric. Paranasal sinuses are clear. Other: Mastoid air cells are clear. MRA HEAD FINDINGS Anterior  circulation: The intracranial internal carotid arteries are patent bilaterally. There is mild atherosclerotic plaque along the carotid siphons without high-grade stenosis. The right M1 segment is patent. Right MCA bifurcation and proximal right M2 branches are patent. Distal right MCA branches appear patent. Left M1 segment is patent. The left MCA bifurcation and proximal M2 branches are patent. Distal left MCA branches appear patent. The A1 and A2 segments are patent bilaterally. Symmetric appearance of visualized distal ACA branches. Posterior circulation: The intracranial vertebral arteries are patent bilaterally. The basilar artery is patent. Posterior cerebral arteries are patent bilaterally. The superior cerebellar arteries are patent proximally. Posterior communicating arteries visualized bilaterally. The AICA and PICA is are visualized bilaterally. Anatomic variants: None significant. IMPRESSION: Acute infarct in the left frontal lobe corresponding to region of abnormality on CT. Moderate chronic microvascular ischemic changes and mild parenchymal volume loss. Patent intracranial arterial vasculature. Electronically Signed   By: Donnice Mania M.D.   On: 12/27/2023 15:53   DG Chest Portable 1 View Result Date: 12/27/2023 CLINICAL DATA:  Chronic obstructive pulmonary disease. EXAM: PORTABLE CHEST 1 VIEW COMPARISON:  Radiographs 10/24/2023 and 10/23/2023. Chest CT 10/21/2023. FINDINGS: 1345 hours. The heart size and mediastinal contours are stable status post median sternotomy and aortic valve replacement. There is aortic atherosclerosis. Patchy airspace disease seen at the left lung base on the most recent study has cleared in the interval. The  lungs are mildly hyperinflated with scattered mild scarring. No new airspace disease, pleural effusion or pneumothorax. The bones appear unchanged. IMPRESSION: Interval clearing of left basilar airspace disease. No acute cardiopulmonary process. Electronically Signed    By: Elsie Perone M.D.   On: 12/27/2023 14:01   CT HEAD CODE STROKE WO CONTRAST Addendum Date: 12/27/2023 ADDENDUM REPORT: 12/27/2023 13:43 ADDENDUM: Impression #1 called by telephone at the time of interpretation on 12/27/2023 at 1:42 pm to provider Dr. Jerrie, who verbally acknowledged these results. Electronically Signed   By: Rockey Childs D.O.   On: 12/27/2023 13:43   Result Date: 12/27/2023 CLINICAL DATA:  Code stroke. Provided history: Neuro deficit, acute, stroke suspected. Aphasia. EXAM: CT HEAD WITHOUT CONTRAST TECHNIQUE: Contiguous axial images were obtained from the base of the skull through the vertex without intravenous contrast. RADIATION DOSE REDUCTION: This exam was performed according to the departmental dose-optimization program which includes automated exposure control, adjustment of the mA and/or kV according to patient size and/or use of iterative reconstruction technique. COMPARISON:  Non-contrast head CT and CT angiogram head/neck 12/20/2021. Brain MRI 12/20/2021. FINDINGS: Brain: Generalized cerebral atrophy. Acute or subacute cortical/subcortical infarct within the mid left frontal lobe (MCA territory), new from the prior head CT of 12/20/2021. The infarct spans 3.4 cm. A known chronic cortical infarct within the left occipital lobe was better appreciated on the prior brain MRI of 12/20/2021 (acute at that time). Background moderate patchy and ill-defined hypoattenuation within the cerebral white matter, nonspecific but compatible chronic small vessel ischemic disease. There is no acute intracranial hemorrhage. No extra-axial fluid collection. No evidence of an intracranial mass. No midline shift. Vascular: No hyperdense vessel.  Atherosclerotic calcifications. Skull: No calvarial fracture or aggressive osseous lesion. Sinuses/Orbits: No mass or acute finding within the imaged orbits. No significant paranasal sinus disease at the imaged levels. ASPECTS (Alberta Stroke Program Early CT  Score) - Ganglionic level infarction (caudate, lentiform nuclei, internal capsule, insula, M1-M3 cortex): 2 - Supraganglionic infarction (M4-M6 cortex): 6 Total score (0-10 with 10 being normal): 8 Attempts are being made to reach the ordering provider at this time. IMPRESSION: 1. 3.4 cm acute or subacute cortical/subcortical infarct within the mid left frontal lobe (MCA vascular territory), new from the prior head CT of 12/20/2021. ASPECTS is 8. 2. Background parenchymal atrophy, chronic small vessel ischemic disease and chronic left occipital lobe infarct, as described. Electronically Signed: By: Rockey Childs D.O. On: 12/27/2023 13:39     Time coordinating discharge: Over 30 minutes    Alm Apo, MD  [facility] 12/28/2023, 2:35 PM

## 2023-12-28 NOTE — Plan of Care (Signed)

## 2023-12-28 NOTE — Progress Notes (Signed)
 Discharged to home after IV access removed and discharge instructions given by [facility] nurse

## 2023-12-28 NOTE — Care Management Obs Status (Signed)
 MEDICARE OBSERVATION STATUS NOTIFICATION   Patient Details  Name: Dana Pruitt MRN: [id number] Date of Birth: 04-22-1941   Medicare Observation Status Notification Given:  Yes Obs letter signed and copy provided   Claretta Deed 12/28/2023, 2:31 PM

## 2023-12-28 NOTE — TOC Transition Note (Signed)
 Transition of Care [facility]) - Discharge Note   Patient Details  Name: Dana Pruitt MRN: [id number] Date of Birth: [birth year]  Transition of Care [facility]) CM/SW Contact:  Andrez JULIANNA George, RN Phone Number: 12/28/2023, 2:50 PM   Clinical Narrative:     Pt is discharging home with resumption of home health services through [facility]. Information on the AVS. [facility] aware of resumption orders.  No new DME needs. Pt has cane/ walker/ shower seat at home. Pt drives self and manages her own medications.  Pt is oxygen  dependent at 5L [place] through [employer]. Her neighbor will provide transportation home today.  Final next level of care: Home w Home Health Services Barriers to Discharge: No Barriers Identified   Patient Goals and CMS Choice   CMS Medicare.gov Compare Post Acute Care list provided to:: Patient Choice offered to / list presented to : Patient      Discharge Placement                       Discharge Plan and Services Additional resources added to the After Visit Summary for                            [facility] Arranged: PT [facility] Agency: [facility] Date [facility] Agency Contacted: 12/28/23   Representative spoke with at [facility] Agency: Arna  Social Drivers of Health (SDOH) Interventions SDOH Screenings   Food Insecurity: No Food Insecurity (12/27/2023)  Housing: Low Risk  (12/27/2023)  Transportation Needs: No Transportation Needs (12/27/2023)  Utilities: Not At Risk (12/27/2023)  Depression (PHQ2-9): Medium Risk (07/26/2023)  Social Connections: Moderately Integrated (12/27/2023)  Tobacco Use: Medium Risk (12/27/2023)     Readmission Risk Interventions    10/22/2023    2:19 PM 09/14/2023    1:01 PM  Readmission Risk Prevention Plan  Transportation Screening Complete Complete  PCP or Specialist Appt within 3-5 Days Complete   HRI or Home Care Consult Complete Complete  Social Work Consult for Recovery Care Planning/Counseling Complete Complete  Palliative Care Screening  Complete Not Applicable  Medication Review Oceanographer) Complete Referral to Pharmacy

## 2023-12-29 ENCOUNTER — Telehealth: Payer: Self-pay | Admitting: Cardiovascular Disease

## 2023-12-29 DIAGNOSIS — E43 Unspecified severe protein-calorie malnutrition: Secondary | ICD-10-CM | POA: Diagnosis not present

## 2023-12-29 DIAGNOSIS — I5042 Chronic combined systolic (congestive) and diastolic (congestive) heart failure: Secondary | ICD-10-CM | POA: Diagnosis not present

## 2023-12-29 DIAGNOSIS — J441 Chronic obstructive pulmonary disease with (acute) exacerbation: Secondary | ICD-10-CM | POA: Diagnosis not present

## 2023-12-29 DIAGNOSIS — I081 Rheumatic disorders of both mitral and tricuspid valves: Secondary | ICD-10-CM | POA: Diagnosis not present

## 2023-12-29 DIAGNOSIS — E785 Hyperlipidemia, unspecified: Secondary | ICD-10-CM | POA: Diagnosis not present

## 2023-12-29 DIAGNOSIS — I2489 Other forms of acute ischemic heart disease: Secondary | ICD-10-CM | POA: Diagnosis not present

## 2023-12-29 DIAGNOSIS — D509 Iron deficiency anemia, unspecified: Secondary | ICD-10-CM | POA: Diagnosis not present

## 2023-12-29 DIAGNOSIS — J9621 Acute and chronic respiratory failure with hypoxia: Secondary | ICD-10-CM | POA: Diagnosis not present

## 2023-12-29 DIAGNOSIS — I480 Paroxysmal atrial fibrillation: Secondary | ICD-10-CM | POA: Diagnosis not present

## 2023-12-29 DIAGNOSIS — F32A Depression, unspecified: Secondary | ICD-10-CM | POA: Diagnosis not present

## 2023-12-29 DIAGNOSIS — J309 Allergic rhinitis, unspecified: Secondary | ICD-10-CM | POA: Diagnosis not present

## 2023-12-29 DIAGNOSIS — I428 Other cardiomyopathies: Secondary | ICD-10-CM | POA: Diagnosis not present

## 2023-12-29 DIAGNOSIS — I2510 Atherosclerotic heart disease of native coronary artery without angina pectoris: Secondary | ICD-10-CM | POA: Diagnosis not present

## 2023-12-29 DIAGNOSIS — F419 Anxiety disorder, unspecified: Secondary | ICD-10-CM | POA: Diagnosis not present

## 2023-12-29 DIAGNOSIS — M8580 Other specified disorders of bone density and structure, unspecified site: Secondary | ICD-10-CM | POA: Diagnosis not present

## 2023-12-29 DIAGNOSIS — Z7951 Long term (current) use of inhaled steroids: Secondary | ICD-10-CM | POA: Diagnosis not present

## 2023-12-29 DIAGNOSIS — I447 Left bundle-branch block, unspecified: Secondary | ICD-10-CM | POA: Diagnosis not present

## 2023-12-29 DIAGNOSIS — J439 Emphysema, unspecified: Secondary | ICD-10-CM | POA: Diagnosis not present

## 2023-12-29 DIAGNOSIS — I2721 Secondary pulmonary arterial hypertension: Secondary | ICD-10-CM | POA: Diagnosis not present

## 2023-12-29 DIAGNOSIS — M62838 Other muscle spasm: Secondary | ICD-10-CM | POA: Diagnosis not present

## 2023-12-29 DIAGNOSIS — Z7901 Long term (current) use of anticoagulants: Secondary | ICD-10-CM | POA: Diagnosis not present

## 2023-12-29 DIAGNOSIS — K219 Gastro-esophageal reflux disease without esophagitis: Secondary | ICD-10-CM | POA: Diagnosis not present

## 2023-12-29 DIAGNOSIS — H34212 Partial retinal artery occlusion, left eye: Secondary | ICD-10-CM | POA: Diagnosis not present

## 2023-12-29 DIAGNOSIS — I110 Hypertensive heart disease with heart failure: Secondary | ICD-10-CM | POA: Diagnosis not present

## 2023-12-29 DIAGNOSIS — I712 Thoracic aortic aneurysm, without rupture, unspecified: Secondary | ICD-10-CM | POA: Diagnosis not present

## 2023-12-29 MED ORDER — AMIODARONE HCL 200 MG PO TABS: 200.0000 mg | ORAL_TABLET | Freq: Every day | ORAL | 3 refills | Status: AC

## 2023-12-29 NOTE — Telephone Encounter (Signed)
 Pt's medication was sent to pt's pharmacy as requested. Confirmation received.

## 2023-12-29 NOTE — Telephone Encounter (Signed)
*  STAT* If patient is at the pharmacy, call can be transferred to refill team.   1. Which medications need to be refilled? (please list name of each medication and dose if known) amiodarone  (PACERONE ) 200 MG tablet   2. Which pharmacy/location (including street and city if local pharmacy) is medication to be sent to?  [id number] - [place], [place] - [address] AT [facility] OF [address]      3. Do they need a 30 day or 90 day supply? 90 day    Pt is out of medication

## 2023-12-31 DIAGNOSIS — I480 Paroxysmal atrial fibrillation: Secondary | ICD-10-CM | POA: Diagnosis not present

## 2023-12-31 DIAGNOSIS — I428 Other cardiomyopathies: Secondary | ICD-10-CM | POA: Diagnosis not present

## 2023-12-31 DIAGNOSIS — I081 Rheumatic disorders of both mitral and tricuspid valves: Secondary | ICD-10-CM | POA: Diagnosis not present

## 2023-12-31 DIAGNOSIS — Z7951 Long term (current) use of inhaled steroids: Secondary | ICD-10-CM | POA: Diagnosis not present

## 2023-12-31 DIAGNOSIS — J9621 Acute and chronic respiratory failure with hypoxia: Secondary | ICD-10-CM | POA: Diagnosis not present

## 2023-12-31 DIAGNOSIS — H34212 Partial retinal artery occlusion, left eye: Secondary | ICD-10-CM | POA: Diagnosis not present

## 2023-12-31 DIAGNOSIS — I2721 Secondary pulmonary arterial hypertension: Secondary | ICD-10-CM | POA: Diagnosis not present

## 2023-12-31 DIAGNOSIS — M62838 Other muscle spasm: Secondary | ICD-10-CM | POA: Diagnosis not present

## 2023-12-31 DIAGNOSIS — I712 Thoracic aortic aneurysm, without rupture, unspecified: Secondary | ICD-10-CM | POA: Diagnosis not present

## 2023-12-31 DIAGNOSIS — E785 Hyperlipidemia, unspecified: Secondary | ICD-10-CM | POA: Diagnosis not present

## 2023-12-31 DIAGNOSIS — F32A Depression, unspecified: Secondary | ICD-10-CM | POA: Diagnosis not present

## 2023-12-31 DIAGNOSIS — I447 Left bundle-branch block, unspecified: Secondary | ICD-10-CM | POA: Diagnosis not present

## 2023-12-31 DIAGNOSIS — F419 Anxiety disorder, unspecified: Secondary | ICD-10-CM | POA: Diagnosis not present

## 2023-12-31 DIAGNOSIS — J439 Emphysema, unspecified: Secondary | ICD-10-CM | POA: Diagnosis not present

## 2023-12-31 DIAGNOSIS — E43 Unspecified severe protein-calorie malnutrition: Secondary | ICD-10-CM | POA: Diagnosis not present

## 2023-12-31 DIAGNOSIS — D509 Iron deficiency anemia, unspecified: Secondary | ICD-10-CM | POA: Diagnosis not present

## 2023-12-31 DIAGNOSIS — I2510 Atherosclerotic heart disease of native coronary artery without angina pectoris: Secondary | ICD-10-CM | POA: Diagnosis not present

## 2023-12-31 DIAGNOSIS — I5042 Chronic combined systolic (congestive) and diastolic (congestive) heart failure: Secondary | ICD-10-CM | POA: Diagnosis not present

## 2023-12-31 DIAGNOSIS — I2489 Other forms of acute ischemic heart disease: Secondary | ICD-10-CM | POA: Diagnosis not present

## 2023-12-31 DIAGNOSIS — Z7901 Long term (current) use of anticoagulants: Secondary | ICD-10-CM | POA: Diagnosis not present

## 2023-12-31 DIAGNOSIS — J441 Chronic obstructive pulmonary disease with (acute) exacerbation: Secondary | ICD-10-CM | POA: Diagnosis not present

## 2023-12-31 DIAGNOSIS — I110 Hypertensive heart disease with heart failure: Secondary | ICD-10-CM | POA: Diagnosis not present

## 2023-12-31 DIAGNOSIS — M8580 Other specified disorders of bone density and structure, unspecified site: Secondary | ICD-10-CM | POA: Diagnosis not present

## 2023-12-31 DIAGNOSIS — J309 Allergic rhinitis, unspecified: Secondary | ICD-10-CM | POA: Diagnosis not present

## 2023-12-31 DIAGNOSIS — K219 Gastro-esophageal reflux disease without esophagitis: Secondary | ICD-10-CM | POA: Diagnosis not present

## 2024-01-03 DIAGNOSIS — J9621 Acute and chronic respiratory failure with hypoxia: Secondary | ICD-10-CM | POA: Diagnosis not present

## 2024-01-03 DIAGNOSIS — Z7951 Long term (current) use of inhaled steroids: Secondary | ICD-10-CM | POA: Diagnosis not present

## 2024-01-03 DIAGNOSIS — J449 Chronic obstructive pulmonary disease, unspecified: Secondary | ICD-10-CM | POA: Diagnosis not present

## 2024-01-03 DIAGNOSIS — I428 Other cardiomyopathies: Secondary | ICD-10-CM | POA: Diagnosis not present

## 2024-01-03 DIAGNOSIS — I447 Left bundle-branch block, unspecified: Secondary | ICD-10-CM | POA: Diagnosis not present

## 2024-01-03 DIAGNOSIS — H34212 Partial retinal artery occlusion, left eye: Secondary | ICD-10-CM | POA: Diagnosis not present

## 2024-01-03 DIAGNOSIS — J439 Emphysema, unspecified: Secondary | ICD-10-CM | POA: Diagnosis not present

## 2024-01-03 DIAGNOSIS — I480 Paroxysmal atrial fibrillation: Secondary | ICD-10-CM | POA: Diagnosis not present

## 2024-01-03 DIAGNOSIS — I712 Thoracic aortic aneurysm, without rupture, unspecified: Secondary | ICD-10-CM | POA: Diagnosis not present

## 2024-01-03 DIAGNOSIS — J441 Chronic obstructive pulmonary disease with (acute) exacerbation: Secondary | ICD-10-CM | POA: Diagnosis not present

## 2024-01-03 DIAGNOSIS — Z7901 Long term (current) use of anticoagulants: Secondary | ICD-10-CM | POA: Diagnosis not present

## 2024-01-03 DIAGNOSIS — M8580 Other specified disorders of bone density and structure, unspecified site: Secondary | ICD-10-CM | POA: Diagnosis not present

## 2024-01-03 DIAGNOSIS — I081 Rheumatic disorders of both mitral and tricuspid valves: Secondary | ICD-10-CM | POA: Diagnosis not present

## 2024-01-03 DIAGNOSIS — I2510 Atherosclerotic heart disease of native coronary artery without angina pectoris: Secondary | ICD-10-CM | POA: Diagnosis not present

## 2024-01-03 DIAGNOSIS — I110 Hypertensive heart disease with heart failure: Secondary | ICD-10-CM | POA: Diagnosis not present

## 2024-01-03 DIAGNOSIS — F32A Depression, unspecified: Secondary | ICD-10-CM | POA: Diagnosis not present

## 2024-01-03 DIAGNOSIS — K219 Gastro-esophageal reflux disease without esophagitis: Secondary | ICD-10-CM | POA: Diagnosis not present

## 2024-01-03 DIAGNOSIS — I2489 Other forms of acute ischemic heart disease: Secondary | ICD-10-CM | POA: Diagnosis not present

## 2024-01-03 DIAGNOSIS — F419 Anxiety disorder, unspecified: Secondary | ICD-10-CM | POA: Diagnosis not present

## 2024-01-03 DIAGNOSIS — M62838 Other muscle spasm: Secondary | ICD-10-CM | POA: Diagnosis not present

## 2024-01-03 DIAGNOSIS — I2721 Secondary pulmonary arterial hypertension: Secondary | ICD-10-CM | POA: Diagnosis not present

## 2024-01-03 DIAGNOSIS — J309 Allergic rhinitis, unspecified: Secondary | ICD-10-CM | POA: Diagnosis not present

## 2024-01-03 DIAGNOSIS — I5042 Chronic combined systolic (congestive) and diastolic (congestive) heart failure: Secondary | ICD-10-CM | POA: Diagnosis not present

## 2024-01-03 DIAGNOSIS — E43 Unspecified severe protein-calorie malnutrition: Secondary | ICD-10-CM | POA: Diagnosis not present

## 2024-01-03 DIAGNOSIS — E785 Hyperlipidemia, unspecified: Secondary | ICD-10-CM | POA: Diagnosis not present

## 2024-01-03 DIAGNOSIS — D509 Iron deficiency anemia, unspecified: Secondary | ICD-10-CM | POA: Diagnosis not present

## 2024-01-06 DIAGNOSIS — Z09 Encounter for follow-up examination after completed treatment for conditions other than malignant neoplasm: Secondary | ICD-10-CM | POA: Diagnosis not present

## 2024-01-06 DIAGNOSIS — R300 Dysuria: Secondary | ICD-10-CM | POA: Diagnosis not present

## 2024-01-06 DIAGNOSIS — Z8673 Personal history of transient ischemic attack (TIA), and cerebral infarction without residual deficits: Secondary | ICD-10-CM | POA: Diagnosis not present

## 2024-01-06 DIAGNOSIS — D6859 Other primary thrombophilia: Secondary | ICD-10-CM | POA: Diagnosis not present

## 2024-01-07 ENCOUNTER — Ambulatory Visit (HOSPITAL_COMMUNITY)

## 2024-01-07 DIAGNOSIS — J441 Chronic obstructive pulmonary disease with (acute) exacerbation: Secondary | ICD-10-CM | POA: Diagnosis not present

## 2024-01-07 DIAGNOSIS — J9621 Acute and chronic respiratory failure with hypoxia: Secondary | ICD-10-CM | POA: Diagnosis not present

## 2024-01-07 DIAGNOSIS — I081 Rheumatic disorders of both mitral and tricuspid valves: Secondary | ICD-10-CM | POA: Diagnosis not present

## 2024-01-07 DIAGNOSIS — H2513 Age-related nuclear cataract, bilateral: Secondary | ICD-10-CM | POA: Diagnosis not present

## 2024-01-07 DIAGNOSIS — I2489 Other forms of acute ischemic heart disease: Secondary | ICD-10-CM | POA: Diagnosis not present

## 2024-01-07 DIAGNOSIS — I110 Hypertensive heart disease with heart failure: Secondary | ICD-10-CM | POA: Diagnosis not present

## 2024-01-07 DIAGNOSIS — I712 Thoracic aortic aneurysm, without rupture, unspecified: Secondary | ICD-10-CM | POA: Diagnosis not present

## 2024-01-07 DIAGNOSIS — I2721 Secondary pulmonary arterial hypertension: Secondary | ICD-10-CM | POA: Diagnosis not present

## 2024-01-07 DIAGNOSIS — E785 Hyperlipidemia, unspecified: Secondary | ICD-10-CM | POA: Diagnosis not present

## 2024-01-07 DIAGNOSIS — J309 Allergic rhinitis, unspecified: Secondary | ICD-10-CM | POA: Diagnosis not present

## 2024-01-07 DIAGNOSIS — J439 Emphysema, unspecified: Secondary | ICD-10-CM | POA: Diagnosis not present

## 2024-01-07 DIAGNOSIS — Z7951 Long term (current) use of inhaled steroids: Secondary | ICD-10-CM | POA: Diagnosis not present

## 2024-01-07 DIAGNOSIS — F32A Depression, unspecified: Secondary | ICD-10-CM | POA: Diagnosis not present

## 2024-01-07 DIAGNOSIS — I428 Other cardiomyopathies: Secondary | ICD-10-CM | POA: Diagnosis not present

## 2024-01-07 DIAGNOSIS — F419 Anxiety disorder, unspecified: Secondary | ICD-10-CM | POA: Diagnosis not present

## 2024-01-07 DIAGNOSIS — E43 Unspecified severe protein-calorie malnutrition: Secondary | ICD-10-CM | POA: Diagnosis not present

## 2024-01-07 DIAGNOSIS — H34212 Partial retinal artery occlusion, left eye: Secondary | ICD-10-CM | POA: Diagnosis not present

## 2024-01-07 DIAGNOSIS — I2510 Atherosclerotic heart disease of native coronary artery without angina pectoris: Secondary | ICD-10-CM | POA: Diagnosis not present

## 2024-01-07 DIAGNOSIS — I447 Left bundle-branch block, unspecified: Secondary | ICD-10-CM | POA: Diagnosis not present

## 2024-01-07 DIAGNOSIS — K219 Gastro-esophageal reflux disease without esophagitis: Secondary | ICD-10-CM | POA: Diagnosis not present

## 2024-01-07 DIAGNOSIS — M62838 Other muscle spasm: Secondary | ICD-10-CM | POA: Diagnosis not present

## 2024-01-07 DIAGNOSIS — Z7901 Long term (current) use of anticoagulants: Secondary | ICD-10-CM | POA: Diagnosis not present

## 2024-01-07 DIAGNOSIS — I5042 Chronic combined systolic (congestive) and diastolic (congestive) heart failure: Secondary | ICD-10-CM | POA: Diagnosis not present

## 2024-01-07 DIAGNOSIS — D509 Iron deficiency anemia, unspecified: Secondary | ICD-10-CM | POA: Diagnosis not present

## 2024-01-07 DIAGNOSIS — H40013 Open angle with borderline findings, low risk, bilateral: Secondary | ICD-10-CM | POA: Diagnosis not present

## 2024-01-07 DIAGNOSIS — I480 Paroxysmal atrial fibrillation: Secondary | ICD-10-CM | POA: Diagnosis not present

## 2024-01-07 DIAGNOSIS — M8580 Other specified disorders of bone density and structure, unspecified site: Secondary | ICD-10-CM | POA: Diagnosis not present

## 2024-01-08 NOTE — Progress Notes (Signed)
 Cardiology Office Note:    Date:  01/18/2024   ID:  Dana Pruitt, DOB Mar 14, 1941, MRN [id number]  PCP:  Clarice Nottingham, MD  Cardiologist:  Dorn Lesches, MD     Referring MD: Clarice Nottingham, MD   Chief Complaint: follow-up of CHF and atrial fibrillation  History of Present Illness:    Dana Pruitt is a 83 y.o. female with a history of history of mild irregularities but no significant CAD noted on recent cardiac catheterization on 08/31/2023, severe aortic insufficiency s/p AVR in 04/2019, chronic systolic CHF/non-ischemic cardiomyopathy with EF of 35-40% on Echo in 10/2023, paroxysmal atrial fibrillation on Eliquis , mild mitral regurgitation,ascending thoracic aortic aneurysm,  stroke in 12/2021, COPD with chronic hypoxic respiratory failure on 5L of O2, hypertension, hyperlipidemia, CKD stage III, GERD, anxiety/depression, and breast cancer s/p chemotherapy/radiation and lumpectomy in 2013  who is followed by Dr. Lesches who presents today for routine follow-up of CHF and atrial fibrillation.   Patient was referred to Dr. Lesches in 2015 after Echo showed LVEF of 35-40% with moderate aortic insufficiency. She was started on beta-blocker for her cardiomyopathy and EF eventually normalized to to 60-65% on repeat Echo in 06/2016. Aortic disease progressed and Echo in 09/2018 showed LVEF of 40-45 with diffuse hypokinesis, grade 1 diastolic dysfunction, and severe AI.She was admitted in 12/2018 with chest pain and shortness of breath. She underwent R/LHC at that time which showed mild non-obstructive CAD, wide aortic pulse pressure suggestive of significant AI, preserved cardiac output, and low filling pressures. Patient ultimately underwent AVR with 21mm Inspiris Edwards valve by Dr. Fleeta Ochoa on 01/31/2019. She did develop post-op atrial fibrillation but converted back to sinus rhythm after initiation of Amiodarone . She was discharge on Amiodarone  and Coumadin  both of which were able to be discontinued  later. However, she had recurrent episodes of atrial fibrillation and was started on Eliquis . Coronary CTA in 12/2021 for further evaluation of dyspnea on exertion and an episode of left arm pain showed a coronary calcium  score of 709 (87th percentile for age and sex) with mild (25-49%) plaque in the RCA, LAD, and LCX (FFR negative) as well as as a 4.2 cm ascending aortic aneurysm (stable from prior imaging). CTA also showed signs of pulmonary emphysema and a new 6mm spiculated pulmonary nodule in the anterior right upper lobe for which she was advised to follow-up with Pulmonology.    At visit in 07/2023, she reported increasing shortness of breath over the last 6 months. Echo showed LVEF of 30-35% with global hypokinesis but prominent septal-lateral dyssynchrony due to LBBB, mild LVH, and grade 1 diastolic dysfunction as well as mildly reduce RV function, mild MR, stable bioprosthetic aortic valve, and mild ascending aorta dilatation measuring 43mm. She has since had multiple hospitalizations.    She was admitted in 08/30/2023 to 09/02/2023 for chest pain that woke her up from sleep and radiated down her right neck, shoulder, and to her abdomen. High-sensitivity troponin was negative x2. However, she underwent a R/LHC which showed patent coronary arteries with only mild irregularities and essentially normal intracardiac hemodynamics. On the morning of 3/12, she went into rapid atrial fibrillation with rates as high as the 120s to 130s. She became hypotensive with this and had a syncopal episode while on the bedside commode. No ventricular arrhythmias or pauses were seen on telemetry at this time. She was started on IV Amiodarone  and given IV fluids. She converted back to sinus rhythm with this and BP improved. She was  transitioned to PO Amiodarone  and started on Bisoprolol  (previously on Diltiazem  but this was stopped due to decreased EF).    She was then admitted from 09/13/2023 to 09/16/2023 for acute on chronic  hypoxic respiratory failure and sepsis secondary to influenza A and superimposed bacterial pneumonia as well as a COPD exacerbation. She was treated with Tamiflu , antibiotics, steroids, and breathing treatments.    She was readmitted in 10/2023 for acute on chronic hypoxic respiratory failure secondary to COPD exacerbation after presenting with worsening shortness of breath and cough. Repeat limited Echo showed LVEF of 35-40% with global hypokinesis, mild MR, mild to moderate TR, and mild dilatation of the ascending aorta measuring 44 mm. She was treated with antibiotics and steroids.   She was last seen by me on 12/27/2023 at which time she sounded stable from a cardiac standpoint but reported new stroke like symptoms that started that morning (confusion and trouble word finding. She was sent to the ED for stroke work-up.  Head CT showed a 3.4 cm acute or subacute cortical/ subcortical infarct within the mid left frontal lobe. Brain MRI/ head MRA showed acute infarct in the left frontal lobe. Echo showed LVEF of 40-45% with no regional wall motion abnormalities, low normal RV function, moderate MR, moderate to severe TR, mild dilatation of the aortic root measuring 41mm, and a severely dilated pulmonary artery. Carotid dopplers showed 1-30% stenosis of right ICA but no significant disease. She was admitted overnight and seen by Neurology. Pan CT was recommended to rule out advanced malignancy but patient wanted to go home and preferred to do this as an outpatient.   Her PCP (Dr. Clarice) ordered a chest/ abdominal/ pelvic CT which was done on 01/12/2024 and showed a 2.3cm hypoattenuating mass either arising from or abutting the pancreatic head/ uncinate process. She is scheduled to see Oncology later this week. CT also showed emphsema, multiple small lung nodules, and a 4.4 cm aneurysm dilatation of the ascending aorta.  Patient presents today for follow-up. She is stable from a cardiac standpoint. She has  chronic dyspnea due to COPD but this is stable. She is on 5L of O2 24/7 at home. No new or worsening orthopnea. No PND or edema. shortness of breath. Stable on 5L of O2 No chest pain, orthopena, PND, edema. No palpitations. She does have occasional episodes of lightheadedness/ dizziness. She had one episode 4 weeks ago that sounds orthostatic in nature and occurred when she had been bending over and then stood up quickly. However, none of the other episodes occurred with position changes. She recently had 2 episodes in a 1 week span but episodes usually don't occur this frequently. She was not able to tell me how frequently this episodes occur. No syncope.  She is understandably stressed/ anxious over new diagnosis of pancreatic mass.     EKGs/Labs/Other Studies Reviewed:    The following studies were reviewed:  Right/ Left Cardiac Catheterization 08/31/2023: 1.  Patent coronary arteries with mild irregularities but no stenotic lesions.  Mild ectasia in the proximal RCA and mid circumflex. 2.  Essentially normal intracardiac hemodynamics with a right atrial mean pressure of 4 mmHg, PA pressure of 33/14 mean 21 mmHg, pulmonary wedge pressure of 5 mmHg, and preserved cardiac output of 3.7 L/min   Plan: med Rx per inpatient cardiology team. OK to resume apixaban  tomorrow am (orders written).    Diagnostic Dominance: Right  _______________   Limited Echocardiogram 10/25/2023: Impressions:  1. Left ventricular ejection fraction, by estimation,  is 35 to 40%. The  left ventricle has moderately decreased function. The left ventricle  demonstrates global hypokinesis.   2. The mitral valve is normal in structure. Mild mitral valve  regurgitation.   3. Tricuspid valve regurgitation is mild to moderate.   4. The aortic valve has been repaired/replaced. Aortic valve  regurgitation is not visualized. Aortic valve sclerosis is present, with  no evidence of aortic valve stenosis. There is a 21 mm a  bioprosthetic  valve present in the aortic position. Procedure  Date: 04/26/19. Aortic valve mean gradient measures 10.0 mmHg.   5. Aortic dilatation noted. There is mild dilatation of the ascending  aorta, measuring 44 mm.   6. There is normal pulmonary artery systolic pressure.   7. The inferior vena cava is normal in size with greater than 50%  respiratory variability, suggesting right atrial pressure of 3 mmHg.  _______________  Carotid Dopplers 12/28/2023: Summary: - Right Carotid: Velocities in the right ICA are consistent with a 1-39%  stenosis.  - Left Carotid: The extracranial vessels were near-normal with only minimal  wall thickening or plaque.  - Vertebrals:  Bilateral vertebral arteries demonstrate antegrade flow.  - Subclavians: Normal flow hemodynamics were seen in bilateral subclavian arteries.  _______________  Complete Echocardiogram 12/28/2023: Impressions: 1. Left ventricular ejection fraction, by estimation, is 40 to 45%. The  left ventricle has mildly decreased function. The left ventricle has no  regional wall motion abnormalities. Left ventricular diastolic function  could not be evaluated.   2. Right ventricular systolic function is low normal. The right  ventricular size is normal.   3. Left atrial size was mildly dilated.   4. Right atrial size was mildly dilated.   5. The mitral valve is normal in structure. Moderate mitral valve  regurgitation. No evidence of mitral stenosis.   6. Tricuspid valve regurgitation is moderate to severe.   7. The aortic valve has been repaired/replaced. Aortic valve  regurgitation is not visualized. Aortic valve mean gradient measures 9.0  mmHg. Aortic valve Vmax measures 2.10 m/s. Aortic valve acceleration time  measures 74 msec.   8. Aortic dilatation noted. There is mild dilatation of the aortic root,  measuring 41 mm.   9. Severely dilated pulmonary artery.   Comparison(s): Prior images reviewed side by side. Mitral  regurgitation  has increased. Prior study used echo-contrast for LVEF assessment;  difficult comparison.   EKG:  EKG not ordered today.   Recent Labs: 08/16/2023: Pro B Natriuretic peptide (BNP) 473.0 09/02/2023: TSH 2.008 09/14/2023: Magnesium  1.7 10/23/2023: B Natriuretic Peptide 928.4 12/28/2023: ALT 14; BUN 18; Creatinine, Ser 1.25; Hemoglobin 8.9; Platelets 199; Potassium 4.2; Sodium 140  Recent Lipid Panel    Component Value Date/Time   CHOL 151 12/28/2023 0457   CHOL 197 02/16/2014 0826   TRIG 101 12/28/2023 0457   HDL 38 (L) 12/28/2023 0457   HDL 52 02/16/2014 0826   CHOLHDL 4.0 12/28/2023 0457   VLDL 20 12/28/2023 0457   LDLCALC 93 12/28/2023 0457   LDLCALC 127 (H) 02/16/2014 0826    Physical Exam:    Vital Signs: BP 134/84   Pulse 65   Ht 5' 4 (1.626 m)   Wt 119 lb (54 kg)   LMP  (LMP Unknown)   SpO2 93%   BMI 20.43 kg/m     Wt Readings from Last 3 Encounters:  01/18/24 119 lb (54 kg)  12/27/23 118 lb (53.5 kg)  11/11/23 120 lb 4.8 oz (54.6 kg)  General: 83 y.o. Caucasian female in no acute distress. On 5L of O2 via nasal cannula. HEENT: Normocephalic and atraumatic. Sclera clear.  Neck: Supple. No JVD. Heart: RRR. Distinct S1 and S2. No murmurs, gallops, or rubs.  Lungs: No increased work of breathing. Decreased breath sounds bilaterally but no wheezes, rhonchi, or rales.   Extremities: No lower extremity edema.   Skin: Warm and dry. Neuro: No focal deficits. Psych: Normal affect. Responds appropriately.   Assessment:    1. Coronary artery disease involving native coronary artery of native heart without angina pectoris   2. Chronic HFrEF (heart failure with reduced ejection fraction) (HCC)   3. Non-ischemic cardiomyopathy (HCC)   4. Paroxysmal atrial fibrillation (HCC)   5. History of syncope   6. Severe aortic insufficiency s/p AVR   7. Moderate mitral regurgitation   8. Tricuspid valve insufficiency, unspecified etiology   9. Aneurysm of  ascending aorta without rupture (HCC)   10. Hyperlipidemia, unspecified hyperlipidemia type   11. Stage 3a chronic kidney disease (HCC)   12. Pulmonary emphysema, unspecified emphysema type (HCC)   13. Chronic hypoxic respiratory failure (HCC)   14. Recurrent cerebrovascular accidents (CVAs) (HCC)     Plan:    Mild Non-Obstructive CAD Patient has a history intermittent chest pain. She has had multiple ischemic work-ups with cardiac catheterizations and coronary CTAs in the past which have been unremarkable. Most  recent LHC  in 08/2023  during and admission for chest pain showed mild irregularities but no significant CAD.  - No chest pain.  - No aspirin  due to need for full anticoagulation.  - Continue statin/ Zetia .    Chronic HFrEF Non-Ischemic Cardiomyopathy Recent Echo on 12/28/2023 during showed LVEF of 40-45% with no regional wall motion abnormalities, low normal RV function, - She has chronic dyspnea on exertion due to COPD but euvolemic on exam.   - GDMT limited by hypotension. - She has Lasix  20mg  daily and KCl 10 mEq daily listed under her medication list but she is not sure how she is taking these. Asked patient to go home and look at her medications and then call us  or send us  a MyChart message updating us  on how she is taking these.  - Continue Losartan  25mg  daily.  - She has Bisoprolol  2.5mg  daily and Coreg  12.5mg  twice daily listed under her medications. However, she states she is not taking the Bisoprolol  and is only taking the Coreg . Will remove Bisoprolol  from her medication list. - Unclear cause of her cardiomyopathy. Possibly secondary to dyssynchrony from LBBB. She may benefit from referral to Advanced CHF clinic given GDMT is limited by hypotension. Will hold off on this for now given she seems stable from a CHF standpoint and given new diagnosis of pancreatic mass.    Paroxysmal Atrial Fibrillation Maintaining sinus rhythm on exam.  - Continue Amiodarone  200mg  daily.   - Continue  Coreg  12.5mg  twice daily. - Continue Eliquis  2.5 mg twice daily (reduced dose due to age and weight).   Dizziness History of Syncope She had a syncopal episode during admission in 08/2023 which occurred in the setting of rapid atrial fibrillation and hypotension. No significant arrhythmias were noted on telemetry at the time.  - No recurrent syncope. However, she does report occasional episodes of lightheadedness/ dizziness. One sounds orthostatic and occurred after going from bending to standing. The others occurred randomly. No associated palpitations.  - Patient has a history of hypotension so wonder if this is the cause of her occasional  dizziness. BP stable today at 134/84. Offered an outpatient monitor but patient would like to hold off for now given new diagnosis of pancreatic mass and need for Oncology work-up. Advised patient to let us  know if symptoms worsen or become more frequent.   Severe Aortic Insufficiency s/p AVR Moderate Mitral Regurgitation Moderate to Severe Tricuspid Regurgitation  History of AVR in 2020. Echo earlier this month on 12/28/2023  showed stable bioprosthetic aortic vave with mean gradient of 9.0 mmHg as well as moderate MR and moderate to severe TR. MR and TR had both increased since 10/2023. - Continue SBE prophylaxis.  - Continue routine monitoring with serial Echos.    Ascending Thoracic Aortic Aneurysm  Recent chest CT in 12/2023 showed ascending thoracic aortic aneurysm measuring 4.4cm. Stable from 4.5cm on CTA in 10/2023. - Semi-annual imaging recommended per guidelines.    Hyperlipidemia Lipid panel in 08/2023: Total Cholesterol 151, Triglycerides 101, HDL 38, LDL 93. LDL goal <55 given multiple strokes and CAD.  - Currently on Lipitor 20mg  daily and Zetia  10mg  daily. Continue. - LDL is not quite at goal. Did offer to increase Lipitor but she would like to hold off on this for now. Given advanced age and other comorbidities (including newly  diagnosed pancreatic mass), do not know if there is much utility in being overly aggressive in treating this.    CKD Stage IIIa  Baseline creatinine is around 1.0 to 1.2   COPD  Chronic Hypoxic Respiratory Failure On 5L of O2 at home.  - Stable.  Recurrent CVA History of acute stroke in 12/2021 and more recently on 12/27/2023. - Contiue Eliquis . - Continue statin/ Zetia  as above. - Follow-up with Neurology as recommended. They recommended pan CT to rule out malignancy. Will defer this to Neurology or PCP. They also recommended considering a Watchman device if malignancy is ruled out and if she has a recurrent stroke. Outpatient CT showed a new pancreatic mass and she is scheduled to see Oncology later this week.  Disposition: Follow up in 3 months.    Signed, Aline FORBES Door, PA-C  01/18/2024 3:28 PM    [facility]

## 2024-01-12 DIAGNOSIS — R1900 Intra-abdominal and pelvic swelling, mass and lump, unspecified site: Secondary | ICD-10-CM | POA: Diagnosis not present

## 2024-01-12 DIAGNOSIS — I288 Other diseases of pulmonary vessels: Secondary | ICD-10-CM | POA: Diagnosis not present

## 2024-01-12 DIAGNOSIS — Z8673 Personal history of transient ischemic attack (TIA), and cerebral infarction without residual deficits: Secondary | ICD-10-CM | POA: Diagnosis not present

## 2024-01-12 DIAGNOSIS — I7121 Aneurysm of the ascending aorta, without rupture: Secondary | ICD-10-CM | POA: Diagnosis not present

## 2024-01-12 DIAGNOSIS — K8020 Calculus of gallbladder without cholecystitis without obstruction: Secondary | ICD-10-CM | POA: Diagnosis not present

## 2024-01-12 DIAGNOSIS — J439 Emphysema, unspecified: Secondary | ICD-10-CM | POA: Diagnosis not present

## 2024-01-12 DIAGNOSIS — K5730 Diverticulosis of large intestine without perforation or abscess without bleeding: Secondary | ICD-10-CM | POA: Diagnosis not present

## 2024-01-14 ENCOUNTER — Telehealth: Payer: Self-pay | Admitting: Hematology and Oncology

## 2024-01-14 NOTE — Telephone Encounter (Signed)
 Spoke with patient confirming upcoming appointment

## 2024-01-18 ENCOUNTER — Ambulatory Visit: Attending: Student | Admitting: Student

## 2024-01-18 ENCOUNTER — Encounter: Payer: Self-pay | Admitting: Student

## 2024-01-18 VITALS — BP 134/84 | HR 65 | Ht 64.0 in | Wt 119.0 lb

## 2024-01-18 DIAGNOSIS — I351 Nonrheumatic aortic (valve) insufficiency: Secondary | ICD-10-CM

## 2024-01-18 DIAGNOSIS — I639 Cerebral infarction, unspecified: Secondary | ICD-10-CM

## 2024-01-18 DIAGNOSIS — I2510 Atherosclerotic heart disease of native coronary artery without angina pectoris: Secondary | ICD-10-CM | POA: Diagnosis not present

## 2024-01-18 DIAGNOSIS — I7121 Aneurysm of the ascending aorta, without rupture: Secondary | ICD-10-CM

## 2024-01-18 DIAGNOSIS — N1831 Chronic kidney disease, stage 3a: Secondary | ICD-10-CM

## 2024-01-18 DIAGNOSIS — Z87898 Personal history of other specified conditions: Secondary | ICD-10-CM

## 2024-01-18 DIAGNOSIS — E785 Hyperlipidemia, unspecified: Secondary | ICD-10-CM

## 2024-01-18 DIAGNOSIS — I5022 Chronic systolic (congestive) heart failure: Secondary | ICD-10-CM

## 2024-01-18 DIAGNOSIS — I480 Paroxysmal atrial fibrillation: Secondary | ICD-10-CM | POA: Diagnosis not present

## 2024-01-18 DIAGNOSIS — J9611 Chronic respiratory failure with hypoxia: Secondary | ICD-10-CM

## 2024-01-18 DIAGNOSIS — I428 Other cardiomyopathies: Secondary | ICD-10-CM | POA: Diagnosis not present

## 2024-01-18 DIAGNOSIS — I340 Nonrheumatic mitral (valve) insufficiency: Secondary | ICD-10-CM

## 2024-01-18 DIAGNOSIS — I071 Rheumatic tricuspid insufficiency: Secondary | ICD-10-CM

## 2024-01-18 DIAGNOSIS — J439 Emphysema, unspecified: Secondary | ICD-10-CM

## 2024-01-18 NOTE — Patient Instructions (Signed)
 Medication Instructions:  Please call or send a mychart message to let us  know how you are taking the potassium and lasix  medication.  *If you need a refill on your cardiac medications before your next appointment, please call your pharmacy*   Lab Work: No labs were ordered during today's visit.  If you have labs (blood work) drawn today and your tests are completely normal, you will receive your results only by: MyChart Message (if you have MyChart) OR A paper copy in the mail If you have any lab test that is abnormal or we need to change your treatment, we will call you to review the results.   Testing/Procedures: No procedures were ordered during today's visit.    Follow-Up: At [facility], you and your health needs are our priority.  As part of our continuing mission to provide you with exceptional heart care, we have created designated Provider Care Teams.  These Care Teams include your primary Cardiologist (physician) and Advanced Practice Providers (APPs -  Physician Assistants and Nurse Practitioners) who all work together to provide you with the care you need, when you need it.  We recommend signing up for the patient portal called MyChart.  Sign up information is provided on this After Visit Summary.  MyChart is used to connect with patients for Virtual Visits (Telemedicine).  Patients are able to view lab/test results, encounter notes, upcoming appointments, etc.  Non-urgent messages can be sent to your provider as well.   To learn more about what you can do with MyChart, go to [email or web address].    Your next appointment:   3 month(s)  Provider:   Callie Goodrich, PA-C          Other Instructions Thank you for choosing [facility]!

## 2024-01-19 ENCOUNTER — Telehealth: Payer: Self-pay

## 2024-01-19 DIAGNOSIS — I447 Left bundle-branch block, unspecified: Secondary | ICD-10-CM | POA: Diagnosis not present

## 2024-01-19 DIAGNOSIS — M62838 Other muscle spasm: Secondary | ICD-10-CM | POA: Diagnosis not present

## 2024-01-19 DIAGNOSIS — E43 Unspecified severe protein-calorie malnutrition: Secondary | ICD-10-CM | POA: Diagnosis not present

## 2024-01-19 DIAGNOSIS — D509 Iron deficiency anemia, unspecified: Secondary | ICD-10-CM | POA: Diagnosis not present

## 2024-01-19 DIAGNOSIS — J9621 Acute and chronic respiratory failure with hypoxia: Secondary | ICD-10-CM | POA: Diagnosis not present

## 2024-01-19 DIAGNOSIS — E785 Hyperlipidemia, unspecified: Secondary | ICD-10-CM | POA: Diagnosis not present

## 2024-01-19 DIAGNOSIS — J309 Allergic rhinitis, unspecified: Secondary | ICD-10-CM | POA: Diagnosis not present

## 2024-01-19 DIAGNOSIS — I2721 Secondary pulmonary arterial hypertension: Secondary | ICD-10-CM | POA: Diagnosis not present

## 2024-01-19 DIAGNOSIS — H34212 Partial retinal artery occlusion, left eye: Secondary | ICD-10-CM | POA: Diagnosis not present

## 2024-01-19 DIAGNOSIS — I081 Rheumatic disorders of both mitral and tricuspid valves: Secondary | ICD-10-CM | POA: Diagnosis not present

## 2024-01-19 DIAGNOSIS — Z7901 Long term (current) use of anticoagulants: Secondary | ICD-10-CM | POA: Diagnosis not present

## 2024-01-19 DIAGNOSIS — F32A Depression, unspecified: Secondary | ICD-10-CM | POA: Diagnosis not present

## 2024-01-19 DIAGNOSIS — I428 Other cardiomyopathies: Secondary | ICD-10-CM | POA: Diagnosis not present

## 2024-01-19 DIAGNOSIS — K219 Gastro-esophageal reflux disease without esophagitis: Secondary | ICD-10-CM | POA: Diagnosis not present

## 2024-01-19 DIAGNOSIS — M8580 Other specified disorders of bone density and structure, unspecified site: Secondary | ICD-10-CM | POA: Diagnosis not present

## 2024-01-19 DIAGNOSIS — I110 Hypertensive heart disease with heart failure: Secondary | ICD-10-CM | POA: Diagnosis not present

## 2024-01-19 DIAGNOSIS — Z7951 Long term (current) use of inhaled steroids: Secondary | ICD-10-CM | POA: Diagnosis not present

## 2024-01-19 DIAGNOSIS — I5042 Chronic combined systolic (congestive) and diastolic (congestive) heart failure: Secondary | ICD-10-CM | POA: Diagnosis not present

## 2024-01-19 DIAGNOSIS — I2489 Other forms of acute ischemic heart disease: Secondary | ICD-10-CM | POA: Diagnosis not present

## 2024-01-19 DIAGNOSIS — I2510 Atherosclerotic heart disease of native coronary artery without angina pectoris: Secondary | ICD-10-CM | POA: Diagnosis not present

## 2024-01-19 DIAGNOSIS — I712 Thoracic aortic aneurysm, without rupture, unspecified: Secondary | ICD-10-CM | POA: Diagnosis not present

## 2024-01-19 DIAGNOSIS — J441 Chronic obstructive pulmonary disease with (acute) exacerbation: Secondary | ICD-10-CM | POA: Diagnosis not present

## 2024-01-19 DIAGNOSIS — J439 Emphysema, unspecified: Secondary | ICD-10-CM | POA: Diagnosis not present

## 2024-01-19 DIAGNOSIS — F419 Anxiety disorder, unspecified: Secondary | ICD-10-CM | POA: Diagnosis not present

## 2024-01-19 DIAGNOSIS — I480 Paroxysmal atrial fibrillation: Secondary | ICD-10-CM | POA: Diagnosis not present

## 2024-01-19 NOTE — Telephone Encounter (Signed)
 Pt verbally confirmed appt for 7/31

## 2024-01-20 ENCOUNTER — Inpatient Hospital Stay: Attending: Internal Medicine | Admitting: Hematology and Oncology

## 2024-01-20 ENCOUNTER — Telehealth: Payer: Self-pay | Admitting: *Deleted

## 2024-01-20 ENCOUNTER — Inpatient Hospital Stay

## 2024-01-20 VITALS — BP 101/47 | HR 71 | Temp 98.1°F | Resp 17 | Wt 115.8 lb

## 2024-01-20 DIAGNOSIS — K8689 Other specified diseases of pancreas: Secondary | ICD-10-CM

## 2024-01-20 DIAGNOSIS — Z853 Personal history of malignant neoplasm of breast: Secondary | ICD-10-CM | POA: Insufficient documentation

## 2024-01-20 DIAGNOSIS — K869 Disease of pancreas, unspecified: Secondary | ICD-10-CM | POA: Diagnosis not present

## 2024-01-20 DIAGNOSIS — I6782 Cerebral ischemia: Secondary | ICD-10-CM | POA: Diagnosis not present

## 2024-01-20 DIAGNOSIS — D509 Iron deficiency anemia, unspecified: Secondary | ICD-10-CM | POA: Insufficient documentation

## 2024-01-20 DIAGNOSIS — Z7901 Long term (current) use of anticoagulants: Secondary | ICD-10-CM | POA: Diagnosis not present

## 2024-01-20 DIAGNOSIS — Z79899 Other long term (current) drug therapy: Secondary | ICD-10-CM | POA: Diagnosis not present

## 2024-01-20 DIAGNOSIS — I480 Paroxysmal atrial fibrillation: Secondary | ICD-10-CM | POA: Insufficient documentation

## 2024-01-20 DIAGNOSIS — J449 Chronic obstructive pulmonary disease, unspecified: Secondary | ICD-10-CM | POA: Diagnosis not present

## 2024-01-20 DIAGNOSIS — Z8673 Personal history of transient ischemic attack (TIA), and cerebral infarction without residual deficits: Secondary | ICD-10-CM | POA: Insufficient documentation

## 2024-01-20 DIAGNOSIS — D378 Neoplasm of uncertain behavior of other specified digestive organs: Secondary | ICD-10-CM | POA: Diagnosis not present

## 2024-01-20 DIAGNOSIS — I700 Atherosclerosis of aorta: Secondary | ICD-10-CM | POA: Insufficient documentation

## 2024-01-20 LAB — CBC WITH DIFFERENTIAL/PLATELET
Abs Immature Granulocytes: 0.03 K/uL (ref 0.00–0.07)
Basophils Absolute: 0.1 K/uL (ref 0.0–0.1)
Basophils Relative: 1 %
Eosinophils Absolute: 0.2 K/uL (ref 0.0–0.5)
Eosinophils Relative: 4 %
HCT: 31.8 % — ABNORMAL LOW (ref 36.0–46.0)
Hemoglobin: 10.3 g/dL — ABNORMAL LOW (ref 12.0–15.0)
Immature Granulocytes: 1 %
Lymphocytes Relative: 14 %
Lymphs Abs: 0.8 K/uL (ref 0.7–4.0)
MCH: 30.8 pg (ref 26.0–34.0)
MCHC: 32.4 g/dL (ref 30.0–36.0)
MCV: 95.2 fL (ref 80.0–100.0)
Monocytes Absolute: 0.5 K/uL (ref 0.1–1.0)
Monocytes Relative: 9 %
Neutro Abs: 4.3 K/uL (ref 1.7–7.7)
Neutrophils Relative %: 71 %
Platelets: 232 K/uL (ref 150–400)
RBC: 3.34 MIL/uL — ABNORMAL LOW (ref 3.87–5.11)
RDW: 15.6 % — ABNORMAL HIGH (ref 11.5–15.5)
WBC: 6 K/uL (ref 4.0–10.5)
nRBC: 0 % (ref 0.0–0.2)

## 2024-01-20 LAB — CMP (CANCER CENTER ONLY)
ALT: 21 U/L (ref 0–44)
AST: 21 U/L (ref 15–41)
Albumin: 4.3 g/dL (ref 3.5–5.0)
Alkaline Phosphatase: 34 U/L — ABNORMAL LOW (ref 38–126)
Anion gap: 5 (ref 5–15)
BUN: 27 mg/dL — ABNORMAL HIGH (ref 8–23)
CO2: 32 mmol/L (ref 22–32)
Calcium: 9.9 mg/dL (ref 8.9–10.3)
Chloride: 104 mmol/L (ref 98–111)
Creatinine: 1.41 mg/dL — ABNORMAL HIGH (ref 0.44–1.00)
GFR, Estimated: 37 mL/min — ABNORMAL LOW (ref >60)
Glucose, Bld: 113 mg/dL — ABNORMAL HIGH (ref 70–99)
Potassium: 4 mmol/L (ref 3.5–5.1)
Sodium: 141 mmol/L (ref 135–145)
Total Bilirubin: 0.4 mg/dL (ref 0.0–1.2)
Total Protein: 6.7 g/dL (ref 6.5–8.1)

## 2024-01-20 LAB — CEA (ACCESS): CEA (CHCC): 3.63 ng/mL (ref 0.00–5.00)

## 2024-01-20 NOTE — Telephone Encounter (Signed)
 Per visit today MD inquired if we could get MRI of abd done more quickly then scheduled appt on 8/14 at [facility].  Note pt had CT showing new pancreatic mass.  Per inquiry with central scheduling MRI can be done this week-but cannot scheduled until current order and auth is pulled back.  This RN noted ordering MD per above is Dr Clarice- this RN called his office and left a detailed message per above with this RN name and direct number to return call.

## 2024-01-20 NOTE — Progress Notes (Signed)
 [facility] CONSULT NOTE  Patient Care Team: Clarice Nottingham, MD as PCP - General Court Dorn PARAS, MD as PCP - Cardiology (Cardiology) Homsher, Wanda, RN as VBCI Care Management  CHIEF COMPLAINTS/PURPOSE OF CONSULTATION:  Anemia, pancreatic mass, recent stroke.  ASSESSMENT & PLAN:  This is a very pleasant 83 yr female pt with PMH significant for right-sided breast cancer, A-fib on anticoagulation, COPD, iron deficiency anemia referred to hematology for evaluation and recommendations regarding the anemia.  Pancreatic head mass measuring 1.3 cm is noted on the recent CT chest abdomen pelvis.  MRI abdomen pancreatic protocol was recommended.  This is scheduled in the [facility] for August 12.  Patient would like to have this imaging done sooner. Our nursing team is working on this. If this is indeed pancreatic cancer and no evidence of systemic disease, we could present her in the GI tumor board to see if she is eligible for any local therapies  Patient with anemia, we will draw some labs today.  I reviewed her labs from her most recent hospitalization.  Hemoglobin improved to 10.3 g/dL.  Will continue to monitor.  COPD on 5 L of oxygen , stable.  She will follow-up with me in 2 weeks to review the imaging results and to review any additional recommendations.  HISTORY OF PRESENTING ILLNESS:  Dana Pruitt 83 y.o. female is here because of IDA.  Discussed the use of AI scribe software for clinical note transcription with the patient, who gave verbal consent to proceed.  History of Present Illness  Dana Pruitt is here for follow-up after hospital discharge she went to the hospital with chief complaint of inability to speak when she was in the cardiologist office.  She was found to have acute infarct in the left frontal lobe.  Her progressive aphasia has finally resolved.  Hospital she was evaluated by neurology, had a carotid ultrasound, echocardiogram and she was  recommended to have systemic imaging to rule out metastatic malignancy which could have made her hypercoagulable.  She had CT chest abdomen pelvis and she is here to review results.  She continues on 5 L of oxygen  for her COPD, no change.  She denies any abdominal pain or back pain, weight loss. No change in her bowel habits or urinary habits  Rest of the pertinent 10 point ROS reviewed and negative  MEDICAL HISTORY:  Past Medical History:  Diagnosis Date   Anemia    several times over the years (10/17/2012)   Anxiety    Breast cancer (HCC) 02/2010   right breast   CAD (coronary artery disease)    a. LHC 12/2018: mild non-obstructive CAD   Carcinoma of breast treated with adjuvant hormone therapy (HCC) 02/2010   Femara    Chronic systolic CHF    a. Echo in 2015: LVEF of 35-40%, b. Echo in 12/2021: LVEF of 60-65%   COPD (chronic obstructive pulmonary disease) (HCC)    Depression    GERD (gastroesophageal reflux disease)    diet controlled, no med   Hollenhorst plaque, left eye    apparently resolved at recent ophthalmology visit   Hyperlipidemia    Hypertension    IDC, RightStage II, 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   Moderate aortic insufficiency    Non-ischemic cardiomyopathy (HCC)    Paroxysmal atrial fibrillation (HCC)    Pneumonia    that's why I'm here (10/17/2012)   S/P AVR (aortic valve replacement) 04/26/2019   Stroke (HCC)    12/2021  SURGICAL HISTORY: Past Surgical History:  Procedure Laterality Date   AORTIC VALVE REPLACEMENT N/A 01/31/2019   Procedure: AORTIC VALVE REPLACEMENT (AVR) USING 21 MM INSPIRIS RESILIS AORTIC VALVE. SN: [id number];  Surgeon: Fleeta Ochoa, Maude, MD;  Location: [facility] OR;  Service: Open Heart Surgery;  Laterality: N/A;   APPENDECTOMY  1974   BREAST BIOPSY Right 02/27/10   Needle core Biopsy; Invasive Mammary; ER/PR Positive, Her-2 Neu negative, Ki-67 22%   BREAST LUMPECTOMY WITH SENTINEL LYMPH NODE BIOPSY Right 07/03/11   Invasive Ductal Carcinoma;0/3  nodes negative,; ER,PR Positive, Her-2 Neg; Ki-67 22%   CESAREAN SECTION  1970; 1974   COLONOSCOPY     DILATION AND CURETTAGE OF UTERUS  1970's   RIGHT HEART CATH AND CORONARY ANGIOGRAPHY N/A 08/31/2023   Procedure: RIGHT HEART CATH AND CORONARY ANGIOGRAPHY;  Surgeon: Wonda Sharper, MD;  Location: [facility] INVASIVE CV LAB;  Service: Cardiovascular;  Laterality: N/A;   RIGHT/LEFT HEART CATH AND CORONARY ANGIOGRAPHY N/A 01/02/2019   Procedure: RIGHT/LEFT HEART CATH AND CORONARY ANGIOGRAPHY;  Surgeon: Rolan Ezra RAMAN, MD;  Location: [facility] INVASIVE CV LAB;  Service: Cardiovascular;  Laterality: N/A;   TEE WITHOUT CARDIOVERSION N/A 01/31/2019   Procedure: TRANSESOPHAGEAL ECHOCARDIOGRAM (TEE);  Surgeon: Fleeta Ochoa, Maude, MD;  Location: [facility] OR;  Service: Open Heart Surgery;  Laterality: N/A;   TUBAL LIGATION  1974   WISDOM TOOTH EXTRACTION      SOCIAL HISTORY: Social History   Socioeconomic History   Marital status: Married    Spouse name: Not on file   Number of children: 2   Years of education: college   Highest education level: Bachelor's degree (e.g., BA, AB, BS)  Occupational History    Employer: RETIRED  Tobacco Use   Smoking status: Former    Current packs/day: 0.00    Average packs/day: 3.0 packs/day for 32.1 years (96.3 ttl pk-yrs)    Types: Cigarettes    Start date: 05/19/1958    Quit date: 06/30/1990    Years since quitting: 33.5   Smokeless tobacco: Never   Tobacco comments:    Stopped smoking for 2-3 years during second child.   Vaping Use   Vaping status: Never Used  Substance and Sexual Activity   Alcohol use: Not Currently   Drug use: No   Sexual activity: Yes    Birth control/protection: Post-menopausal    Comment: Menarche age 20, G72,P2, Menopause in 28s, No HRT Married  Other Topics Concern   Not on file  Social History Narrative   [facility] (12/29/16):   Originally from [place], [place]. Moved to [place] at 83 y.o. Does have a dog. Hasn't worked outside the home.  Remote parakeet exposure from her children. No mold or hot tub exposure. Enjoys doing yard work & walking.    Social Drivers of Corporate investment banker Strain: Not on file  Food Insecurity: No Food Insecurity (12/27/2023)   Hunger Vital Sign    Worried About Running Out of Food in the Last Year: Never true    Ran Out of Food in the Last Year: Never true  Transportation Needs: No Transportation Needs (12/27/2023)   PRAPARE - Administrator, Civil Service (Medical): No    Lack of Transportation (Non-Medical): No  Physical Activity: Not on file  Stress: Not on file  Social Connections: Moderately Integrated (12/27/2023)   Social Connection and Isolation Panel    Frequency of Communication with Friends and Family: More than three times a week    Frequency  of Social Gatherings with Friends and Family: More than three times a week    Attends Religious Services: Never    Database administrator or Organizations: Yes    Attends Engineer, structural: More than 4 times per year    Marital Status: Married  Catering manager Violence: Not At Risk (12/27/2023)   Humiliation, Afraid, Rape, and Kick questionnaire    Fear of Current or Ex-Partner: No    Emotionally Abused: No    Physically Abused: No    Sexually Abused: No    FAMILY HISTORY: Family History  Problem Relation Age of Onset   Heart disease Mother    Cancer Father    Heart disease Maternal Grandmother    Heart disease Maternal Grandfather    Cancer Paternal Grandmother    Lung disease Neg Hx     ALLERGIES:  has no known allergies.  MEDICATIONS:  Current Outpatient Medications  Medication Sig Dispense Refill   albuterol  (PROVENTIL ) (2.5 MG/3ML) 0.083% nebulizer solution Take 3 mLs (2.5 mg total) by nebulization every 6 (six) hours as needed for wheezing or shortness of breath. 75 mL 5   albuterol  (VENTOLIN  HFA) 108 (90 Base) MCG/ACT inhaler Inhale 2 puffs into the lungs every 6 (six) hours as needed for  wheezing or shortness of breath (4 times daily). INHALE 2 PUFFS BY MOUTH EVERY 4 HOURS AS NEEDED ONLY IF YOU CAN'T CATCH YOUR BREATH 18 g 3   amiodarone  (PACERONE ) 200 MG tablet Take 1 tablet (200 mg total) by mouth daily. 90 tablet 3   apixaban  (ELIQUIS ) 2.5 MG TABS tablet Take 1 tablet (2.5 mg total) by mouth 2 (two) times daily. 180 tablet 1   atorvastatin  (LIPITOR) 20 MG tablet Take 20 mg by mouth daily.      BREZTRI  AEROSPHERE 160-9-4.8 MCG/ACT AERO INHALE 2 PUFFS INTO THE LUNGS TWICE DAILY 10.7 g 11   carvedilol  (COREG ) 12.5 MG tablet Take 12.5 mg by mouth 2 (two) times daily with a meal.     ezetimibe  (ZETIA ) 10 MG tablet Take 10 mg by mouth daily.     FEROSUL 325 (65 Fe) MG tablet Take 325 mg by mouth every other day.     fluticasone  (FLONASE ) 50 MCG/ACT nasal spray SHAKE LIQUID AND USE 2 SPRAYS IN EACH NOSTRIL DAILY 16 g 11   furosemide  (LASIX ) 20 MG tablet Take 20 mg by mouth daily.     ipratropium (ATROVENT ) 0.06 % nasal spray Place 2 sprays into both nostrils 4 (four) times daily. 15 mL 12   losartan  (COZAAR ) 25 MG tablet Take 25 mg by mouth daily.     OXYGEN  Inhale 5 L/min into the lungs continuous.     pantoprazole  (PROTONIX ) 40 MG tablet Take 40 mg by mouth daily.     potassium chloride  (KLOR-CON  M) 10 MEQ tablet Take 10 mEq by mouth daily.     sertraline  (ZOLOFT ) 100 MG tablet Take 200 mg by mouth daily.     Cholecalciferol  (VITAMIN D3) 25 MCG (1000 UT) CAPS Take 1,000 Units by mouth daily. (Patient not taking: Reported on 01/20/2024)     guaiFENesin  (MUCINEX ) 600 MG 12 hr tablet Take 1 tablet (600 mg total) by mouth 2 (two) times daily. 30 tablet 0   guaiFENesin -dextromethorphan  (ROBITUSSIN DM) 100-10 MG/5ML syrup Take 5 mLs by mouth every 4 (four) hours as needed for cough. (Patient not taking: Reported on 01/20/2024) 118 mL 0   No current facility-administered medications for this visit.   Facility-Administered Medications  Ordered in Other Visits  Medication Dose Route  Frequency Provider Last Rate Last Admin   sodium chloride  flush (NS) 0.9 % injection 3 mL  3 mL Intravenous Q12H Rolan Ezra RAMAN, MD         PHYSICAL EXAMINATION: ECOG PERFORMANCE STATUS: 1 - Symptomatic but completely ambulatory  BP (!) 101/47 (BP Location: Left Arm, Cuff Size: Small)   Pulse 71   Temp 98.1 F (36.7 C) (Temporal)   Resp 17   Wt 115 lb 12.8 oz (52.5 kg)   LMP  (LMP Unknown)   SpO2 95%   BMI 19.88 kg/m    GENERAL:alert, no distress and comfortable Chest: CTA bilaterally. No adv sounds. On 5 L oxygen  No cervical or supraclavicular adenopathy Heart: RRR Abdomen: soft, non tender, non distended, no organomegaly No LE edema.  LABORATORY DATA:  I have reviewed the data as listed Lab Results  Component Value Date   WBC 6.0 01/20/2024   HGB 10.3 (L) 01/20/2024   HCT 31.8 (L) 01/20/2024   MCV 95.2 01/20/2024   PLT 232 01/20/2024     Chemistry      Component Value Date/Time   NA 140 12/28/2023 0457   NA 136 09/09/2023 1659   NA 138 11/27/2014 1017   K 4.2 12/28/2023 0457   K 4.9 11/27/2014 1017   CL 106 12/28/2023 0457   CL 105 03/31/2012 1410   CO2 25 12/28/2023 0457   CO2 25 11/27/2014 1017   BUN 18 12/28/2023 0457   BUN 22 09/09/2023 1659   BUN 18.7 11/27/2014 1017   CREATININE 1.25 (H) 12/28/2023 0457   CREATININE 1.19 (H) 03/23/2023 1117   CREATININE 1.0 11/27/2014 1017      Component Value Date/Time   CALCIUM  9.3 12/28/2023 0457   CALCIUM  10.1 11/27/2014 1017   ALKPHOS 30 (L) 12/28/2023 0457   ALKPHOS 35 (L) 11/27/2014 1017   AST 16 12/28/2023 0457   AST 16 03/23/2023 1117   AST 19 11/27/2014 1017   ALT 14 12/28/2023 0457   ALT 12 03/23/2023 1117   ALT 12 11/27/2014 1017   BILITOT 0.4 12/28/2023 0457   BILITOT 0.2 09/09/2023 1659   BILITOT 0.6 03/23/2023 1117   BILITOT 0.45 11/27/2014 1017       RADIOGRAPHIC STUDIES: I have personally reviewed the radiological images as listed and agreed with the findings in the  report. ECHOCARDIOGRAM COMPLETE Result Date: 12/28/2023    ECHOCARDIOGRAM REPORT   Patient Name:   JENNI THEW Date of Exam: 12/28/2023 Medical Rec #:  [id number]       Height:       64.0 in Accession #:    [id number]      Weight:       118.0 lb Date of Birth:  [birth year]      BSA:          1.563 m Patient Age:    82 years        BP:           156/79 mmHg Patient Gender: F               HR:           68 bpm. Exam Location:  Inpatient Procedure: 2D Echo, Cardiac Doppler and Color Doppler (Both Spectral and Color            Flow Doppler were utilized during procedure). Indications:    CHF- s/p AVR  History:  Patient has prior history of Echocardiogram examinations. CHF,                 Aortic Valve Disease; Risk Factors:Hypertension.  Sonographer:    Vella Key Referring Phys: [id number] CORTNEY E DE LA TORRE IMPRESSIONS  1. Left ventricular ejection fraction, by estimation, is 40 to 45%. The left ventricle has mildly decreased function. The left ventricle has no regional wall motion abnormalities. Left ventricular diastolic function could not be evaluated.  2. Right ventricular systolic function is low normal. The right ventricular size is normal.  3. Left atrial size was mildly dilated.  4. Right atrial size was mildly dilated.  5. The mitral valve is normal in structure. Moderate mitral valve regurgitation. No evidence of mitral stenosis.  6. Tricuspid valve regurgitation is moderate to severe.  7. The aortic valve has been repaired/replaced. Aortic valve regurgitation is not visualized. Aortic valve mean gradient measures 9.0 mmHg. Aortic valve Vmax measures 2.10 m/s. Aortic valve acceleration time measures 74 msec.  8. Aortic dilatation noted. There is mild dilatation of the aortic root, measuring 41 mm.  9. Severely dilated pulmonary artery. Comparison(s): Prior images reviewed side by side. Mitral regurgitation has increased. Prior study used echo-contrast for LVEF assessment; difficult comparison.  FINDINGS  Left Ventricle: Left ventricular ejection fraction, by estimation, is 40 to 45%. The left ventricle has mildly decreased function. The left ventricle has no regional wall motion abnormalities. The left ventricular internal cavity size was normal in size. There is no left ventricular hypertrophy. Abnormal (paradoxical) septal motion consistent with post-operative status. Left ventricular diastolic function could not be evaluated due to mitral regurgitation (moderate or greater). Left ventricular diastolic function could not be evaluated. Right Ventricle: The right ventricular size is normal. No increase in right ventricular wall thickness. Right ventricular systolic function is low normal. Left Atrium: Left atrial size was mildly dilated. Right Atrium: Right atrial size was mildly dilated. Pericardium: There is no evidence of pericardial effusion. Mitral Valve: The mitral valve is normal in structure. Moderate mitral valve regurgitation. No evidence of mitral valve stenosis. Tricuspid Valve: The tricuspid valve is normal in structure. Tricuspid valve regurgitation is moderate to severe. No evidence of tricuspid stenosis. Aortic Valve: The aortic valve has been repaired/replaced. Aortic valve regurgitation is not visualized. Aortic valve mean gradient measures 9.0 mmHg. Aortic valve peak gradient measures 17.6 mmHg. There is a 21 mm bioprosthetic valve present in the aortic position. Pulmonic Valve: The pulmonic valve was normal in structure. Pulmonic valve regurgitation is mild. Aorta: Aortic dilatation noted. There is mild dilatation of the aortic root, measuring 41 mm. Pulmonary Artery: The pulmonary artery is severely dilated. IAS/Shunts: The atrial septum is grossly normal.  LEFT VENTRICLE PLAX 2D LVIDd:         3.70 cm     Diastology LVIDs:         2.70 cm     LV e' medial:    4.57 cm/s LV PW:         0.90 cm     LV E/e' medial:  11.1 LV IVS:        0.90 cm     LV e' lateral:   8.59 cm/s LVOT diam:      1.60 cm     LV E/e' lateral: 5.9 LV SV:         33 LV SV Index:   21 LVOT Area:     2.01 cm  LV Volumes (MOD) LV vol d, MOD  A2C: 93.9 ml LV vol d, MOD A4C: 93.3 ml LV vol s, MOD A2C: 35.0 ml LV vol s, MOD A4C: 40.0 ml LV SV MOD A2C:     58.9 ml LV SV MOD A4C:     93.3 ml LV SV MOD BP:      55.8 ml RIGHT VENTRICLE RV Basal diam:  3.90 cm RV S prime:     7.83 cm/s TAPSE (M-mode): 1.6 cm LEFT ATRIUM             Index        RIGHT ATRIUM           Index LA diam:        5.10 cm 3.26 cm/m   RA Area:     19.70 cm LA Vol (A2C):   45.6 ml 29.17 ml/m  RA Volume:   56.60 ml  36.20 ml/m LA Vol (A4C):   50.8 ml 32.49 ml/m LA Biplane Vol: 52.1 ml 33.33 ml/m  AORTIC VALVE AV Area (Vmax): 0.79 cm AV Vmax:        210.00 cm/s AV Peak Grad:   17.6 mmHg AV Mean Grad:   9.0 mmHg     PULMONARY ARTERY LVOT Vmax:      82.20 cm/s   MPA diam:        4.10 cm LVOT Vmean:     63.700 cm/s LVOT VTI:       0.166 m  AORTA Ao Root diam: 4.10 cm MITRAL VALVE               TRICUSPID VALVE MV Area (PHT): 4.65 cm    TR Peak grad:   42.8 mmHg MV Decel Time: 163 msec    TR Vmax:        327.00 cm/s MV E velocity: 50.80 cm/s MV A velocity: 75.50 cm/s  SHUNTS MV E/A ratio:  0.67        Systemic VTI:  0.17 m                            Systemic Diam: 1.60 cm Stanly Leavens MD Electronically signed by Stanly Leavens MD Signature Date/Time: 12/28/2023/1:46:59 PM    Final    VAS US  CAROTID (at [facility] and [facility] only) Result Date: 12/28/2023 Carotid Arterial Duplex Study Patient Name:  JANNETT SCHMALL  Date of Exam:   12/28/2023 Medical Rec #: [id number]        Accession #:    [id number] Date of Birth: 1940-09-22       Patient Gender: F Patient Age:   50 years Exam Location:  [facility] Procedure:      VAS US  CAROTID Referring Phys: MARSA MELVIN --------------------------------------------------------------------------------  Indications:      CVA. Risk Factors:     Hypertension, hyperlipidemia, coronary artery disease, prior                    CVA. Comparison Study: 06/18/21 - bilateral mild ICA plaque Performing Technologist: Ricka Sturdivant-Jones RDMS, RVT  Examination Guidelines: A complete evaluation includes B-mode imaging, spectral Doppler, color Doppler, and power Doppler as needed of all accessible portions of each vessel. Bilateral testing is considered an integral part of a complete examination. Limited examinations for reoccurring indications may be performed as noted.  Right Carotid Findings: +----------+--------+--------+--------+------------------+---------------------+           PSV cm/sEDV cm/sStenosisPlaque DescriptionComments              +----------+--------+--------+--------+------------------+---------------------+  CCA Prox  56      17                                                      +----------+--------+--------+--------+------------------+---------------------+ CCA Distal54      17                                                      +----------+--------+--------+--------+------------------+---------------------+ ICA Prox  59      20      1-39%   calcific          Velocities suggest                                                        low end of scale      +----------+--------+--------+--------+------------------+---------------------+ ICA Distal67      24                                                      +----------+--------+--------+--------+------------------+---------------------+ ECA       84      11                                                      +----------+--------+--------+--------+------------------+---------------------+ +----------+--------+-------+----------------+-------------------+           PSV cm/sEDV cmsDescribe        Arm Pressure (mmHG) +----------+--------+-------+----------------+-------------------+ [id number]             Multiphasic, WNL                    +----------+--------+-------+----------------+-------------------+  +---------+--------+--+--------+--+---------+ VertebralPSV cm/s62EDV cm/s21Antegrade +---------+--------+--+--------+--+---------+  Left Carotid Findings: +----------+--------+--------+--------+------------------+--------+           PSV cm/sEDV cm/sStenosisPlaque DescriptionComments +----------+--------+--------+--------+------------------+--------+ CCA Prox  69      14                                         +----------+--------+--------+--------+------------------+--------+ CCA Distal50      13              calcific                   +----------+--------+--------+--------+------------------+--------+ ICA Prox  44      18                                tortuous +----------+--------+--------+--------+------------------+--------+ ICA Distal53      19                                         +----------+--------+--------+--------+------------------+--------+  ECA       49      10                                         +----------+--------+--------+--------+------------------+--------+ +----------+--------+--------+----------------+-------------------+           PSV cm/sEDV cm/sDescribe        Arm Pressure (mmHG) +----------+--------+--------+----------------+-------------------+ [id number]              Multiphasic, WNL                    +----------+--------+--------+----------------+-------------------+ +---------+--------+--+--------+--+---------+ VertebralPSV cm/s50EDV cm/s14Antegrade +---------+--------+--+--------+--+---------+   Summary: Right Carotid: Velocities in the right ICA are consistent with a 1-39% stenosis. Left Carotid: The extracranial vessels were near-normal with only minimal wall               thickening or plaque. Vertebrals:  Bilateral vertebral arteries demonstrate antegrade flow. Subclavians: Normal flow hemodynamics were seen in bilateral subclavian              arteries. *See table(s) above for measurements and  observations.  Electronically signed by Eather Popp MD on 12/28/2023 at 1:02:44 PM.    Final    MR BRAIN WO CONTRAST Result Date: 12/27/2023 CLINICAL DATA:  Stroke follow-up. EXAM: MRI HEAD WITHOUT CONTRAST MRA HEAD WITHOUT CONTRAST TECHNIQUE: Multiplanar, multi-echo pulse sequences of the brain and surrounding structures were acquired without intravenous contrast. Angiographic images of the Circle of Willis were acquired using MRA technique without intravenous contrast. COMPARISON:  CT head earlier same day. CTA head and neck 12/20/2021. FINDINGS: MRI HEAD FINDINGS Brain: 3.4 cm region of restricted diffusion in the left frontal lobe involving the cortex and subcortical white matter corresponding to findings on CT compatible with acute infarct. No evidence of intracranial hemorrhage. T2/FLAIR hyperintensity in the periventricular and subcortical white matter as well as within the pons suggestive of chronic microvascular ischemic changes. Mild parenchymal volume loss. No midline shift. The basilar cisterns are patent. No extra-axial fluid collections. Ventricles: Normal size and configuration of the ventricles. Vascular: Skull base flow voids are visualized. Skull and upper cervical spine: No focal abnormality. Sinuses/Orbits: Orbits are symmetric. Paranasal sinuses are clear. Other: Mastoid air cells are clear. MRA HEAD FINDINGS Anterior circulation: The intracranial internal carotid arteries are patent bilaterally. There is mild atherosclerotic plaque along the carotid siphons without high-grade stenosis. The right M1 segment is patent. Right MCA bifurcation and proximal right M2 branches are patent. Distal right MCA branches appear patent. Left M1 segment is patent. The left MCA bifurcation and proximal M2 branches are patent. Distal left MCA branches appear patent. The A1 and A2 segments are patent bilaterally. Symmetric appearance of visualized distal ACA branches. Posterior circulation: The intracranial  vertebral arteries are patent bilaterally. The basilar artery is patent. Posterior cerebral arteries are patent bilaterally. The superior cerebellar arteries are patent proximally. Posterior communicating arteries visualized bilaterally. The AICA and PICA is are visualized bilaterally. Anatomic variants: None significant. IMPRESSION: Acute infarct in the left frontal lobe corresponding to region of abnormality on CT. Moderate chronic microvascular ischemic changes and mild parenchymal volume loss. Patent intracranial arterial vasculature. Electronically Signed   By: Donnice Mania M.D.   On: 12/27/2023 15:53   MR ANGIO HEAD WO CONTRAST Result Date: 12/27/2023 CLINICAL DATA:  Stroke follow-up. EXAM: MRI HEAD WITHOUT CONTRAST MRA HEAD WITHOUT CONTRAST TECHNIQUE: Multiplanar, multi-echo pulse sequences of the  brain and surrounding structures were acquired without intravenous contrast. Angiographic images of the Circle of Willis were acquired using MRA technique without intravenous contrast. COMPARISON:  CT head earlier same day. CTA head and neck 12/20/2021. FINDINGS: MRI HEAD FINDINGS Brain: 3.4 cm region of restricted diffusion in the left frontal lobe involving the cortex and subcortical white matter corresponding to findings on CT compatible with acute infarct. No evidence of intracranial hemorrhage. T2/FLAIR hyperintensity in the periventricular and subcortical white matter as well as within the pons suggestive of chronic microvascular ischemic changes. Mild parenchymal volume loss. No midline shift. The basilar cisterns are patent. No extra-axial fluid collections. Ventricles: Normal size and configuration of the ventricles. Vascular: Skull base flow voids are visualized. Skull and upper cervical spine: No focal abnormality. Sinuses/Orbits: Orbits are symmetric. Paranasal sinuses are clear. Other: Mastoid air cells are clear. MRA HEAD FINDINGS Anterior circulation: The intracranial internal carotid arteries are  patent bilaterally. There is mild atherosclerotic plaque along the carotid siphons without high-grade stenosis. The right M1 segment is patent. Right MCA bifurcation and proximal right M2 branches are patent. Distal right MCA branches appear patent. Left M1 segment is patent. The left MCA bifurcation and proximal M2 branches are patent. Distal left MCA branches appear patent. The A1 and A2 segments are patent bilaterally. Symmetric appearance of visualized distal ACA branches. Posterior circulation: The intracranial vertebral arteries are patent bilaterally. The basilar artery is patent. Posterior cerebral arteries are patent bilaterally. The superior cerebellar arteries are patent proximally. Posterior communicating arteries visualized bilaterally. The AICA and PICA is are visualized bilaterally. Anatomic variants: None significant. IMPRESSION: Acute infarct in the left frontal lobe corresponding to region of abnormality on CT. Moderate chronic microvascular ischemic changes and mild parenchymal volume loss. Patent intracranial arterial vasculature. Electronically Signed   By: Donnice Mania M.D.   On: 12/27/2023 15:53   DG Chest Portable 1 View Result Date: 12/27/2023 CLINICAL DATA:  Chronic obstructive pulmonary disease. EXAM: PORTABLE CHEST 1 VIEW COMPARISON:  Radiographs 10/24/2023 and 10/23/2023. Chest CT 10/21/2023. FINDINGS: 1345 hours. The heart size and mediastinal contours are stable status post median sternotomy and aortic valve replacement. There is aortic atherosclerosis. Patchy airspace disease seen at the left lung base on the most recent study has cleared in the interval. The lungs are mildly hyperinflated with scattered mild scarring. No new airspace disease, pleural effusion or pneumothorax. The bones appear unchanged. IMPRESSION: Interval clearing of left basilar airspace disease. No acute cardiopulmonary process. Electronically Signed   By: Elsie Perone M.D.   On: 12/27/2023 14:01   CT  HEAD CODE STROKE WO CONTRAST Addendum Date: 12/27/2023 ADDENDUM REPORT: 12/27/2023 13:43 ADDENDUM: Impression #1 called by telephone at the time of interpretation on 12/27/2023 at 1:42 pm to provider Dr. Jerrie, who verbally acknowledged these results. Electronically Signed   By: Rockey Childs D.O.   On: 12/27/2023 13:43   Result Date: 12/27/2023 CLINICAL DATA:  Code stroke. Provided history: Neuro deficit, acute, stroke suspected. Aphasia. EXAM: CT HEAD WITHOUT CONTRAST TECHNIQUE: Contiguous axial images were obtained from the base of the skull through the vertex without intravenous contrast. RADIATION DOSE REDUCTION: This exam was performed according to the departmental dose-optimization program which includes automated exposure control, adjustment of the mA and/or kV according to patient size and/or use of iterative reconstruction technique. COMPARISON:  Non-contrast head CT and CT angiogram head/neck 12/20/2021. Brain MRI 12/20/2021. FINDINGS: Brain: Generalized cerebral atrophy. Acute or subacute cortical/subcortical infarct within the mid left frontal lobe (MCA territory), new  from the prior head CT of 12/20/2021. The infarct spans 3.4 cm. A known chronic cortical infarct within the left occipital lobe was better appreciated on the prior brain MRI of 12/20/2021 (acute at that time). Background moderate patchy and ill-defined hypoattenuation within the cerebral white matter, nonspecific but compatible chronic small vessel ischemic disease. There is no acute intracranial hemorrhage. No extra-axial fluid collection. No evidence of an intracranial mass. No midline shift. Vascular: No hyperdense vessel.  Atherosclerotic calcifications. Skull: No calvarial fracture or aggressive osseous lesion. Sinuses/Orbits: No mass or acute finding within the imaged orbits. No significant paranasal sinus disease at the imaged levels. ASPECTS ([place]ber[place] Stroke Program Early CT Score) - Ganglionic level infarction (caudate, lentiform  nuclei, internal capsule, insula, M1-M3 cortex): 2 - Supraganglionic infarction (M4-M6 cortex): 6 Total score (0-10 with 10 being normal): 8 Attempts are being made to reach the ordering provider at this time. IMPRESSION: 1. 3.4 cm acute or subacute cortical/subcortical infarct within the mid left frontal lobe (MCA vascular territory), new from the prior head CT of 12/20/2021. ASPECTS is 8. 2. Background parenchymal atrophy, chronic small vessel ischemic disease and chronic left occipital lobe infarct, as described. Electronically Signed: By: Rockey Childs D.O. On: 12/27/2023 13:39    All questions were answered. The patient knows to call the clinic with any problems, questions or concerns. I spent 30 minutes in the care of this patient including H and P, review of records, counseling and coordination of care.     Amber Stalls, MD 01/20/2024 1:26 PM

## 2024-01-21 ENCOUNTER — Other Ambulatory Visit: Payer: Self-pay | Admitting: *Deleted

## 2024-01-21 DIAGNOSIS — I447 Left bundle-branch block, unspecified: Secondary | ICD-10-CM | POA: Diagnosis not present

## 2024-01-21 DIAGNOSIS — I2489 Other forms of acute ischemic heart disease: Secondary | ICD-10-CM | POA: Diagnosis not present

## 2024-01-21 DIAGNOSIS — F32A Depression, unspecified: Secondary | ICD-10-CM | POA: Diagnosis not present

## 2024-01-21 DIAGNOSIS — I712 Thoracic aortic aneurysm, without rupture, unspecified: Secondary | ICD-10-CM | POA: Diagnosis not present

## 2024-01-21 DIAGNOSIS — F419 Anxiety disorder, unspecified: Secondary | ICD-10-CM | POA: Diagnosis not present

## 2024-01-21 DIAGNOSIS — K219 Gastro-esophageal reflux disease without esophagitis: Secondary | ICD-10-CM | POA: Diagnosis not present

## 2024-01-21 DIAGNOSIS — H34212 Partial retinal artery occlusion, left eye: Secondary | ICD-10-CM | POA: Diagnosis not present

## 2024-01-21 DIAGNOSIS — I2721 Secondary pulmonary arterial hypertension: Secondary | ICD-10-CM | POA: Diagnosis not present

## 2024-01-21 DIAGNOSIS — I5042 Chronic combined systolic (congestive) and diastolic (congestive) heart failure: Secondary | ICD-10-CM | POA: Diagnosis not present

## 2024-01-21 DIAGNOSIS — M8580 Other specified disorders of bone density and structure, unspecified site: Secondary | ICD-10-CM | POA: Diagnosis not present

## 2024-01-21 DIAGNOSIS — I110 Hypertensive heart disease with heart failure: Secondary | ICD-10-CM | POA: Diagnosis not present

## 2024-01-21 DIAGNOSIS — J9621 Acute and chronic respiratory failure with hypoxia: Secondary | ICD-10-CM | POA: Diagnosis not present

## 2024-01-21 DIAGNOSIS — E785 Hyperlipidemia, unspecified: Secondary | ICD-10-CM | POA: Diagnosis not present

## 2024-01-21 DIAGNOSIS — Z7901 Long term (current) use of anticoagulants: Secondary | ICD-10-CM | POA: Diagnosis not present

## 2024-01-21 DIAGNOSIS — R1900 Intra-abdominal and pelvic swelling, mass and lump, unspecified site: Secondary | ICD-10-CM

## 2024-01-21 DIAGNOSIS — I2510 Atherosclerotic heart disease of native coronary artery without angina pectoris: Secondary | ICD-10-CM | POA: Diagnosis not present

## 2024-01-21 DIAGNOSIS — I081 Rheumatic disorders of both mitral and tricuspid valves: Secondary | ICD-10-CM | POA: Diagnosis not present

## 2024-01-21 DIAGNOSIS — Z7951 Long term (current) use of inhaled steroids: Secondary | ICD-10-CM | POA: Diagnosis not present

## 2024-01-21 DIAGNOSIS — J439 Emphysema, unspecified: Secondary | ICD-10-CM | POA: Diagnosis not present

## 2024-01-21 DIAGNOSIS — E43 Unspecified severe protein-calorie malnutrition: Secondary | ICD-10-CM | POA: Diagnosis not present

## 2024-01-21 DIAGNOSIS — M62838 Other muscle spasm: Secondary | ICD-10-CM | POA: Diagnosis not present

## 2024-01-21 DIAGNOSIS — I428 Other cardiomyopathies: Secondary | ICD-10-CM | POA: Diagnosis not present

## 2024-01-21 DIAGNOSIS — J441 Chronic obstructive pulmonary disease with (acute) exacerbation: Secondary | ICD-10-CM | POA: Diagnosis not present

## 2024-01-21 DIAGNOSIS — I480 Paroxysmal atrial fibrillation: Secondary | ICD-10-CM | POA: Diagnosis not present

## 2024-01-21 DIAGNOSIS — J309 Allergic rhinitis, unspecified: Secondary | ICD-10-CM | POA: Diagnosis not present

## 2024-01-21 DIAGNOSIS — D509 Iron deficiency anemia, unspecified: Secondary | ICD-10-CM | POA: Diagnosis not present

## 2024-01-21 LAB — CANCER ANTIGEN 19-9: CA 19-9: 12 U/mL (ref 0–35)

## 2024-01-25 ENCOUNTER — Telehealth: Payer: Self-pay | Admitting: *Deleted

## 2024-01-25 NOTE — Telephone Encounter (Signed)
 Per scan scheduled for 8/7 at [facility] which was originally ordered at [facility] per Dr Clarice for mid August -  Dr Coni could not pull back auth but was able to obtain the MRI to be done at [facility] number is [id number] valid until 02/12/2024

## 2024-01-26 ENCOUNTER — Encounter: Payer: Self-pay | Admitting: Oncology

## 2024-01-26 ENCOUNTER — Other Ambulatory Visit (HOSPITAL_COMMUNITY): Payer: Self-pay | Admitting: Internal Medicine

## 2024-01-26 ENCOUNTER — Encounter (HOSPITAL_BASED_OUTPATIENT_CLINIC_OR_DEPARTMENT_OTHER)

## 2024-01-26 ENCOUNTER — Encounter

## 2024-01-26 ENCOUNTER — Ambulatory Visit (HOSPITAL_BASED_OUTPATIENT_CLINIC_OR_DEPARTMENT_OTHER): Admitting: Nurse Practitioner

## 2024-01-26 DIAGNOSIS — M8580 Other specified disorders of bone density and structure, unspecified site: Secondary | ICD-10-CM | POA: Diagnosis not present

## 2024-01-26 DIAGNOSIS — I712 Thoracic aortic aneurysm, without rupture, unspecified: Secondary | ICD-10-CM | POA: Diagnosis not present

## 2024-01-26 DIAGNOSIS — I5042 Chronic combined systolic (congestive) and diastolic (congestive) heart failure: Secondary | ICD-10-CM | POA: Diagnosis not present

## 2024-01-26 DIAGNOSIS — F419 Anxiety disorder, unspecified: Secondary | ICD-10-CM | POA: Diagnosis not present

## 2024-01-26 DIAGNOSIS — D509 Iron deficiency anemia, unspecified: Secondary | ICD-10-CM | POA: Diagnosis not present

## 2024-01-26 DIAGNOSIS — Z7901 Long term (current) use of anticoagulants: Secondary | ICD-10-CM | POA: Diagnosis not present

## 2024-01-26 DIAGNOSIS — J439 Emphysema, unspecified: Secondary | ICD-10-CM | POA: Diagnosis not present

## 2024-01-26 DIAGNOSIS — J441 Chronic obstructive pulmonary disease with (acute) exacerbation: Secondary | ICD-10-CM | POA: Diagnosis not present

## 2024-01-26 DIAGNOSIS — R1900 Intra-abdominal and pelvic swelling, mass and lump, unspecified site: Secondary | ICD-10-CM

## 2024-01-26 DIAGNOSIS — I081 Rheumatic disorders of both mitral and tricuspid valves: Secondary | ICD-10-CM | POA: Diagnosis not present

## 2024-01-26 DIAGNOSIS — Z7951 Long term (current) use of inhaled steroids: Secondary | ICD-10-CM | POA: Diagnosis not present

## 2024-01-26 DIAGNOSIS — J309 Allergic rhinitis, unspecified: Secondary | ICD-10-CM | POA: Diagnosis not present

## 2024-01-26 DIAGNOSIS — J9621 Acute and chronic respiratory failure with hypoxia: Secondary | ICD-10-CM | POA: Diagnosis not present

## 2024-01-26 DIAGNOSIS — E785 Hyperlipidemia, unspecified: Secondary | ICD-10-CM | POA: Diagnosis not present

## 2024-01-26 DIAGNOSIS — I2489 Other forms of acute ischemic heart disease: Secondary | ICD-10-CM | POA: Diagnosis not present

## 2024-01-26 DIAGNOSIS — I480 Paroxysmal atrial fibrillation: Secondary | ICD-10-CM | POA: Diagnosis not present

## 2024-01-26 DIAGNOSIS — I110 Hypertensive heart disease with heart failure: Secondary | ICD-10-CM | POA: Diagnosis not present

## 2024-01-26 DIAGNOSIS — I2721 Secondary pulmonary arterial hypertension: Secondary | ICD-10-CM | POA: Diagnosis not present

## 2024-01-26 DIAGNOSIS — K219 Gastro-esophageal reflux disease without esophagitis: Secondary | ICD-10-CM | POA: Diagnosis not present

## 2024-01-26 DIAGNOSIS — I428 Other cardiomyopathies: Secondary | ICD-10-CM | POA: Diagnosis not present

## 2024-01-26 DIAGNOSIS — F32A Depression, unspecified: Secondary | ICD-10-CM | POA: Diagnosis not present

## 2024-01-26 DIAGNOSIS — E43 Unspecified severe protein-calorie malnutrition: Secondary | ICD-10-CM | POA: Diagnosis not present

## 2024-01-26 DIAGNOSIS — M62838 Other muscle spasm: Secondary | ICD-10-CM | POA: Diagnosis not present

## 2024-01-26 DIAGNOSIS — H34212 Partial retinal artery occlusion, left eye: Secondary | ICD-10-CM | POA: Diagnosis not present

## 2024-01-26 DIAGNOSIS — I2510 Atherosclerotic heart disease of native coronary artery without angina pectoris: Secondary | ICD-10-CM | POA: Diagnosis not present

## 2024-01-26 DIAGNOSIS — I447 Left bundle-branch block, unspecified: Secondary | ICD-10-CM | POA: Diagnosis not present

## 2024-01-27 ENCOUNTER — Ambulatory Visit (HOSPITAL_COMMUNITY)
Admission: RE | Admit: 2024-01-27 | Discharge: 2024-01-27 | Disposition: A | Discharge status: Home / Self Care | Source: Ambulatory Visit | Attending: Hematology and Oncology | Admitting: Hematology and Oncology

## 2024-01-27 ENCOUNTER — Other Ambulatory Visit (HOSPITAL_COMMUNITY): Payer: Self-pay | Admitting: Internal Medicine

## 2024-01-27 DIAGNOSIS — K5730 Diverticulosis of large intestine without perforation or abscess without bleeding: Secondary | ICD-10-CM | POA: Diagnosis not present

## 2024-01-27 DIAGNOSIS — R1900 Intra-abdominal and pelvic swelling, mass and lump, unspecified site: Secondary | ICD-10-CM

## 2024-01-27 MED ORDER — GADOBUTROL 1 MMOL/ML IV SOLN
5.0000 mL | Freq: Once | INTRAVENOUS | Status: AC | PRN
  Administered 2024-01-27: 5 mL via INTRAVENOUS

## 2024-01-28 DIAGNOSIS — Z7901 Long term (current) use of anticoagulants: Secondary | ICD-10-CM | POA: Diagnosis not present

## 2024-01-28 DIAGNOSIS — I2489 Other forms of acute ischemic heart disease: Secondary | ICD-10-CM | POA: Diagnosis not present

## 2024-01-28 DIAGNOSIS — K219 Gastro-esophageal reflux disease without esophagitis: Secondary | ICD-10-CM | POA: Diagnosis not present

## 2024-01-28 DIAGNOSIS — I110 Hypertensive heart disease with heart failure: Secondary | ICD-10-CM | POA: Diagnosis not present

## 2024-01-28 DIAGNOSIS — D509 Iron deficiency anemia, unspecified: Secondary | ICD-10-CM | POA: Diagnosis not present

## 2024-01-28 DIAGNOSIS — I2510 Atherosclerotic heart disease of native coronary artery without angina pectoris: Secondary | ICD-10-CM | POA: Diagnosis not present

## 2024-01-28 DIAGNOSIS — I081 Rheumatic disorders of both mitral and tricuspid valves: Secondary | ICD-10-CM | POA: Diagnosis not present

## 2024-01-28 DIAGNOSIS — J309 Allergic rhinitis, unspecified: Secondary | ICD-10-CM | POA: Diagnosis not present

## 2024-01-28 DIAGNOSIS — I2721 Secondary pulmonary arterial hypertension: Secondary | ICD-10-CM | POA: Diagnosis not present

## 2024-01-28 DIAGNOSIS — F32A Depression, unspecified: Secondary | ICD-10-CM | POA: Diagnosis not present

## 2024-01-28 DIAGNOSIS — E43 Unspecified severe protein-calorie malnutrition: Secondary | ICD-10-CM | POA: Diagnosis not present

## 2024-01-28 DIAGNOSIS — I428 Other cardiomyopathies: Secondary | ICD-10-CM | POA: Diagnosis not present

## 2024-01-28 DIAGNOSIS — I480 Paroxysmal atrial fibrillation: Secondary | ICD-10-CM | POA: Diagnosis not present

## 2024-01-28 DIAGNOSIS — I712 Thoracic aortic aneurysm, without rupture, unspecified: Secondary | ICD-10-CM | POA: Diagnosis not present

## 2024-01-28 DIAGNOSIS — J439 Emphysema, unspecified: Secondary | ICD-10-CM | POA: Diagnosis not present

## 2024-01-28 DIAGNOSIS — J9621 Acute and chronic respiratory failure with hypoxia: Secondary | ICD-10-CM | POA: Diagnosis not present

## 2024-01-28 DIAGNOSIS — H34212 Partial retinal artery occlusion, left eye: Secondary | ICD-10-CM | POA: Diagnosis not present

## 2024-01-28 DIAGNOSIS — Z7951 Long term (current) use of inhaled steroids: Secondary | ICD-10-CM | POA: Diagnosis not present

## 2024-01-28 DIAGNOSIS — J441 Chronic obstructive pulmonary disease with (acute) exacerbation: Secondary | ICD-10-CM | POA: Diagnosis not present

## 2024-01-28 DIAGNOSIS — M62838 Other muscle spasm: Secondary | ICD-10-CM | POA: Diagnosis not present

## 2024-01-28 DIAGNOSIS — I447 Left bundle-branch block, unspecified: Secondary | ICD-10-CM | POA: Diagnosis not present

## 2024-01-28 DIAGNOSIS — E785 Hyperlipidemia, unspecified: Secondary | ICD-10-CM | POA: Diagnosis not present

## 2024-01-28 DIAGNOSIS — I5042 Chronic combined systolic (congestive) and diastolic (congestive) heart failure: Secondary | ICD-10-CM | POA: Diagnosis not present

## 2024-01-28 DIAGNOSIS — M8580 Other specified disorders of bone density and structure, unspecified site: Secondary | ICD-10-CM | POA: Diagnosis not present

## 2024-01-28 DIAGNOSIS — F419 Anxiety disorder, unspecified: Secondary | ICD-10-CM | POA: Diagnosis not present

## 2024-02-01 ENCOUNTER — Other Ambulatory Visit: Payer: Self-pay | Admitting: Nurse Practitioner

## 2024-02-01 DIAGNOSIS — R0609 Other forms of dyspnea: Secondary | ICD-10-CM

## 2024-02-01 DIAGNOSIS — J441 Chronic obstructive pulmonary disease with (acute) exacerbation: Secondary | ICD-10-CM

## 2024-02-02 DIAGNOSIS — M8580 Other specified disorders of bone density and structure, unspecified site: Secondary | ICD-10-CM | POA: Diagnosis not present

## 2024-02-02 DIAGNOSIS — E43 Unspecified severe protein-calorie malnutrition: Secondary | ICD-10-CM | POA: Diagnosis not present

## 2024-02-02 DIAGNOSIS — I2489 Other forms of acute ischemic heart disease: Secondary | ICD-10-CM | POA: Diagnosis not present

## 2024-02-02 DIAGNOSIS — Z7951 Long term (current) use of inhaled steroids: Secondary | ICD-10-CM | POA: Diagnosis not present

## 2024-02-02 DIAGNOSIS — I712 Thoracic aortic aneurysm, without rupture, unspecified: Secondary | ICD-10-CM | POA: Diagnosis not present

## 2024-02-02 DIAGNOSIS — I081 Rheumatic disorders of both mitral and tricuspid valves: Secondary | ICD-10-CM | POA: Diagnosis not present

## 2024-02-02 DIAGNOSIS — J309 Allergic rhinitis, unspecified: Secondary | ICD-10-CM | POA: Diagnosis not present

## 2024-02-02 DIAGNOSIS — J9621 Acute and chronic respiratory failure with hypoxia: Secondary | ICD-10-CM | POA: Diagnosis not present

## 2024-02-02 DIAGNOSIS — I110 Hypertensive heart disease with heart failure: Secondary | ICD-10-CM | POA: Diagnosis not present

## 2024-02-02 DIAGNOSIS — D509 Iron deficiency anemia, unspecified: Secondary | ICD-10-CM | POA: Diagnosis not present

## 2024-02-02 DIAGNOSIS — M62838 Other muscle spasm: Secondary | ICD-10-CM | POA: Diagnosis not present

## 2024-02-02 DIAGNOSIS — E785 Hyperlipidemia, unspecified: Secondary | ICD-10-CM | POA: Diagnosis not present

## 2024-02-02 DIAGNOSIS — F419 Anxiety disorder, unspecified: Secondary | ICD-10-CM | POA: Diagnosis not present

## 2024-02-02 DIAGNOSIS — J439 Emphysema, unspecified: Secondary | ICD-10-CM | POA: Diagnosis not present

## 2024-02-02 DIAGNOSIS — J441 Chronic obstructive pulmonary disease with (acute) exacerbation: Secondary | ICD-10-CM | POA: Diagnosis not present

## 2024-02-02 DIAGNOSIS — I2510 Atherosclerotic heart disease of native coronary artery without angina pectoris: Secondary | ICD-10-CM | POA: Diagnosis not present

## 2024-02-02 DIAGNOSIS — I480 Paroxysmal atrial fibrillation: Secondary | ICD-10-CM | POA: Diagnosis not present

## 2024-02-02 DIAGNOSIS — I428 Other cardiomyopathies: Secondary | ICD-10-CM | POA: Diagnosis not present

## 2024-02-02 DIAGNOSIS — H34212 Partial retinal artery occlusion, left eye: Secondary | ICD-10-CM | POA: Diagnosis not present

## 2024-02-02 DIAGNOSIS — Z7901 Long term (current) use of anticoagulants: Secondary | ICD-10-CM | POA: Diagnosis not present

## 2024-02-02 DIAGNOSIS — I2721 Secondary pulmonary arterial hypertension: Secondary | ICD-10-CM | POA: Diagnosis not present

## 2024-02-02 DIAGNOSIS — I5042 Chronic combined systolic (congestive) and diastolic (congestive) heart failure: Secondary | ICD-10-CM | POA: Diagnosis not present

## 2024-02-02 DIAGNOSIS — F32A Depression, unspecified: Secondary | ICD-10-CM | POA: Diagnosis not present

## 2024-02-02 DIAGNOSIS — I447 Left bundle-branch block, unspecified: Secondary | ICD-10-CM | POA: Diagnosis not present

## 2024-02-02 DIAGNOSIS — K219 Gastro-esophageal reflux disease without esophagitis: Secondary | ICD-10-CM | POA: Diagnosis not present

## 2024-02-03 ENCOUNTER — Telehealth: Payer: Self-pay | Admitting: Nurse Practitioner

## 2024-02-03 DIAGNOSIS — Z7901 Long term (current) use of anticoagulants: Secondary | ICD-10-CM | POA: Diagnosis not present

## 2024-02-03 DIAGNOSIS — I712 Thoracic aortic aneurysm, without rupture, unspecified: Secondary | ICD-10-CM | POA: Diagnosis not present

## 2024-02-03 DIAGNOSIS — E43 Unspecified severe protein-calorie malnutrition: Secondary | ICD-10-CM | POA: Diagnosis not present

## 2024-02-03 DIAGNOSIS — J439 Emphysema, unspecified: Secondary | ICD-10-CM | POA: Diagnosis not present

## 2024-02-03 DIAGNOSIS — K219 Gastro-esophageal reflux disease without esophagitis: Secondary | ICD-10-CM | POA: Diagnosis not present

## 2024-02-03 DIAGNOSIS — J441 Chronic obstructive pulmonary disease with (acute) exacerbation: Secondary | ICD-10-CM | POA: Diagnosis not present

## 2024-02-03 DIAGNOSIS — I480 Paroxysmal atrial fibrillation: Secondary | ICD-10-CM | POA: Diagnosis not present

## 2024-02-03 DIAGNOSIS — E785 Hyperlipidemia, unspecified: Secondary | ICD-10-CM | POA: Diagnosis not present

## 2024-02-03 DIAGNOSIS — I2510 Atherosclerotic heart disease of native coronary artery without angina pectoris: Secondary | ICD-10-CM | POA: Diagnosis not present

## 2024-02-03 DIAGNOSIS — I447 Left bundle-branch block, unspecified: Secondary | ICD-10-CM | POA: Diagnosis not present

## 2024-02-03 DIAGNOSIS — I428 Other cardiomyopathies: Secondary | ICD-10-CM | POA: Diagnosis not present

## 2024-02-03 DIAGNOSIS — I5042 Chronic combined systolic (congestive) and diastolic (congestive) heart failure: Secondary | ICD-10-CM | POA: Diagnosis not present

## 2024-02-03 DIAGNOSIS — I2721 Secondary pulmonary arterial hypertension: Secondary | ICD-10-CM | POA: Diagnosis not present

## 2024-02-03 DIAGNOSIS — F32A Depression, unspecified: Secondary | ICD-10-CM | POA: Diagnosis not present

## 2024-02-03 DIAGNOSIS — Z7951 Long term (current) use of inhaled steroids: Secondary | ICD-10-CM | POA: Diagnosis not present

## 2024-02-03 DIAGNOSIS — I081 Rheumatic disorders of both mitral and tricuspid valves: Secondary | ICD-10-CM | POA: Diagnosis not present

## 2024-02-03 DIAGNOSIS — H34212 Partial retinal artery occlusion, left eye: Secondary | ICD-10-CM | POA: Diagnosis not present

## 2024-02-03 DIAGNOSIS — J309 Allergic rhinitis, unspecified: Secondary | ICD-10-CM | POA: Diagnosis not present

## 2024-02-03 DIAGNOSIS — M8580 Other specified disorders of bone density and structure, unspecified site: Secondary | ICD-10-CM | POA: Diagnosis not present

## 2024-02-03 DIAGNOSIS — J9621 Acute and chronic respiratory failure with hypoxia: Secondary | ICD-10-CM | POA: Diagnosis not present

## 2024-02-03 DIAGNOSIS — I2489 Other forms of acute ischemic heart disease: Secondary | ICD-10-CM | POA: Diagnosis not present

## 2024-02-03 DIAGNOSIS — D509 Iron deficiency anemia, unspecified: Secondary | ICD-10-CM | POA: Diagnosis not present

## 2024-02-03 DIAGNOSIS — M62838 Other muscle spasm: Secondary | ICD-10-CM | POA: Diagnosis not present

## 2024-02-03 DIAGNOSIS — F419 Anxiety disorder, unspecified: Secondary | ICD-10-CM | POA: Diagnosis not present

## 2024-02-03 DIAGNOSIS — I110 Hypertensive heart disease with heart failure: Secondary | ICD-10-CM | POA: Diagnosis not present

## 2024-02-03 NOTE — Telephone Encounter (Signed)
 That's fine if she feels she needs to be seen sooner; will still need PFT so would recommend keeping the October visit

## 2024-02-03 NOTE — Telephone Encounter (Signed)
 Copied from CRM #[id number]. Topic: Appointments - Scheduling Inquiry for Clinic >> Feb 03, 2024  4:22 PM Leila BROCKS wrote: Reason for CRM: Patient [phone] states had to cancel appointment 01/26/24 PFT and NP, [place] 3 months and PFT f/u due having a cold. Informed patient the next appointment is 03/30/24. Patient declined the appointment and asked to message the nurse to be seen sooner. Please advise and call back.   ----------------------------------------------------------------------- From previous Reason for Contact - Scheduling: Patient/patient representative is calling to schedule an appointment. Refer to attachments for appointment information.

## 2024-02-03 NOTE — Telephone Encounter (Signed)
 Katie, you have an opening next wk, but she would not be able to do PFT first. Do you want to see her without PFT? She refused to wait until Oct which is when next appt with PFT same day is available.

## 2024-02-04 ENCOUNTER — Inpatient Hospital Stay: Attending: Internal Medicine | Admitting: Hematology and Oncology

## 2024-02-04 VITALS — BP 118/60 | HR 77 | Temp 97.6°F | Resp 16 | Ht 64.0 in | Wt 116.4 lb

## 2024-02-04 DIAGNOSIS — D509 Iron deficiency anemia, unspecified: Secondary | ICD-10-CM | POA: Diagnosis not present

## 2024-02-04 DIAGNOSIS — R933 Abnormal findings on diagnostic imaging of other parts of digestive tract: Secondary | ICD-10-CM | POA: Diagnosis not present

## 2024-02-04 DIAGNOSIS — K8689 Other specified diseases of pancreas: Secondary | ICD-10-CM

## 2024-02-04 DIAGNOSIS — Z9981 Dependence on supplemental oxygen: Secondary | ICD-10-CM | POA: Diagnosis not present

## 2024-02-04 DIAGNOSIS — D378 Neoplasm of uncertain behavior of other specified digestive organs: Secondary | ICD-10-CM | POA: Insufficient documentation

## 2024-02-04 NOTE — Telephone Encounter (Signed)
 Spoke with the pt  She is aware of response per Izetta  Will keep oct appts  I scheduler her with Izetta for next Thurs for rov per her request  Nothing further needed

## 2024-02-04 NOTE — Progress Notes (Signed)
 [name] CONSULT NOTE  Patient Care Team: Clarice Nottingham, MD as PCP - [address] Court Dorn PARAS, MD as PCP - Cardiology (Cardiology) Homsher, Wanda, RN as VBCI Care Management  CHIEF COMPLAINTS/PURPOSE OF CONSULTATION:  Anemia, pancreatic mass, recent stroke.  ASSESSMENT & PLAN:  This is a very pleasant 83 yr female pt with PMH significant for right-sided breast cancer, A-fib on anticoagulation, COPD, iron deficiency anemia referred to hematology for evaluation and recommendations regarding the anemia.  Pancreatic head mass measuring 1.3 cm is noted on the recent CT chest abdomen pelvis.  MRI abdomen pancreatic protocol was recommended.   Assessment and Plan Assessment & Plan Suspected pancreatic cystic neoplasm MRI indicates a pancreatic cystic neoplasm, likely IPMN vs mucinous or serous neoplasm.  - Normal tumor markers. - Discussed with Dr Wilhelmenia, he suggested all those options EUS vs follow up with repeat imaging. - He suggested pt see one of them for further recommendations - Urgent referral placed to GI.  Chronic lung disease with oxygen  dependence Chronic lung disease with oxygen  dependence on 5 liters. Anesthesia tolerance concerns for procedures due to lung condition.  I will follow up with her in about 3 weeks with a telephone visit.  HISTORY OF PRESENTING ILLNESS:  Dana Pruitt 83 y.o. female is here because of IDA.  Discussed the use of AI scribe software for clinical note transcription with the patient, who gave verbal consent to proceed.  History of Present Illness Dana Pruitt is an 83 year old female with lung disease and recent stroke who presents for evaluation of a pancreatic lesion.  An MRI on January 27, 2024, raised concerns about a pancreatic lesion. Tumor markers were normal, reducing the likelihood of pancreatic cancer.  She has a history of lung disease and is on five liters of oxygen .  She feels generally well but  experiences fatigue, which she attributes to her lung disease. No unintentional weight loss, and her appetite has been low, possibly due to her recent stroke. She reports a 99% recovery from the stroke, with some residual speech impairment and cognitive difficulties, which she attributes to age.  She is currently taking Eliquis  following her recent stroke.  Rest of the pertinent 10 point ROS reviewed and negative  MEDICAL HISTORY:  Past Medical History:  Diagnosis Date   Anemia    several times over the years (10/17/2012)   Anxiety    Breast cancer (HCC) 02/2010   right breast   CAD (coronary artery disease)    a. LHC 12/2018: mild non-obstructive CAD   Carcinoma of breast treated with adjuvant hormone therapy (HCC) 02/2010   Femara    Chronic systolic CHF    a. Echo in 2015: LVEF of 35-40%, b. 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 SN: [id number];  Surgeon: Fleeta Ochoa, Maude, MD;  Location: [facility] OR;  Service: Open Heart Surgery;  Laterality: N/A;   APPENDECTOMY  1974   BREAST BIOPSY Right 02/27/10   Needle core Biopsy; Invasive Mammary; ER/PR Positive, Her-2 Neu negative, Ki-67 22%   BREAST LUMPECTOMY WITH SENTINEL LYMPH NODE BIOPSY Right 07/03/11   Invasive  Ductal Carcinoma;0/3 nodes negative,; ER,PR Positive, Her-2 Neg; Ki-67 22%   CESAREAN SECTION  1970; 1974   COLONOSCOPY     DILATION AND CURETTAGE OF UTERUS  1970's   RIGHT HEART CATH AND CORONARY ANGIOGRAPHY N/A 08/31/2023   Procedure: RIGHT HEART CATH AND CORONARY ANGIOGRAPHY;  Surgeon: Wonda Sharper, MD;  Location: [facility] INVASIVE CV LAB;  Service: Cardiovascular;  Laterality: N/A;   RIGHT/LEFT HEART CATH AND CORONARY ANGIOGRAPHY N/A 01/02/2019   Procedure: RIGHT/LEFT HEART CATH AND CORONARY ANGIOGRAPHY;  Surgeon: Rolan Ezra RAMAN, MD;  Location: [facility] INVASIVE CV LAB;  Service: Cardiovascular;  Laterality: N/A;   TEE WITHOUT CARDIOVERSION N/A 01/31/2019   Procedure: TRANSESOPHAGEAL ECHOCARDIOGRAM (TEE);  Surgeon: Fleeta Ochoa, Maude, MD;  Location: [facility] OR;  Service: Open Heart Surgery;  Laterality: N/A;   TUBAL LIGATION  1974   Pruitt TOOTH EXTRACTION      SOCIAL HISTORY: Social History   Socioeconomic History   Marital status: Married    Spouse name: Not on file   Number of children: 2   Years of education: college   Highest education level: Bachelor's degree (e.g., BA, AB, BS)  Occupational History    Employer: RETIRED  Tobacco Use   Smoking status: Former    Current packs/day: 0.00    Average packs/day: 3.0 packs/day for 32.1 years (96.3 ttl pk-yrs)    Types: Cigarettes    Start date: 05/19/1958    Quit date: 06/30/1990    Years since quitting: 33.6   Smokeless tobacco: Never   Tobacco comments:    Stopped smoking for 2-3 years during second child.   Vaping Use   Vaping status: Never Used  Substance and Sexual Activity   Alcohol use: Not Currently   Drug use: No   Sexual activity: Yes    Birth control/protection: Post-menopausal    Comment: Menarche age 55, G14,P2, Menopause in 67s, No HRT Married  Other Topics Concern   Not on file  Social History Narrative   [facility] (12/29/16):   Originally from [place], [place]. Moved to [place] at 83 y.o. Does have a dog. Hasn't worked  outside the home. Remote parakeet exposure from her children. No mold or hot tub exposure. Enjoys doing yard work & walking.    Social Drivers of Corporate investment banker Strain: Not on file  Food Insecurity: No Food Insecurity (12/27/2023)   Hunger Vital Sign    Worried About Running Out of Food in the Last Year: Never true    Ran Out of Food in the Last Year: Never true  Transportation Needs: No Transportation Needs (12/27/2023)   PRAPARE - Administrator, Civil Service (Medical): No    Lack of Transportation (Non-Medical): No  Physical Activity: Not on file  Stress: Not on file  Social Connections: Moderately Integrated (12/27/2023)   Social Connection and Isolation Panel    Frequency of Communication with Friends and Family: More than three times a week    Frequency of Social Gatherings with Friends and Family: More than three times a week    Attends Religious Services:  Never    Active Member of Clubs or Organizations: Yes    Attends Club or Organization Meetings: More than 4 times per year    Marital Status: Married  Catering manager Violence: Not At Risk (12/27/2023)   Humiliation, Afraid, Rape, and Kick questionnaire    Fear of Current or Ex-Partner: No    Emotionally Abused: No    Physically Abused: No    Sexually Abused: No    FAMILY HISTORY: Family History  Problem Relation Age of Onset   Heart disease Mother    Cancer Father    Heart disease Maternal Grandmother    Heart disease Maternal Grandfather    Cancer Paternal Grandmother    Lung disease Neg Hx     ALLERGIES:  has no known allergies.  MEDICATIONS:  Current Outpatient Medications  Medication Sig Dispense Refill   albuterol  (PROVENTIL ) (2.5 MG/3ML) 0.083% nebulizer solution USE 1 VIAL VIA NEBULIZER EVERY 6 HOURS AS NEEDED FOR WHEEZING OR SHORTNESS OF BREATH 270 mL 8   albuterol  (VENTOLIN  HFA) 108 (90 Base) MCG/ACT inhaler Inhale 2 puffs into the lungs every 6 (six) hours as needed for wheezing or  shortness of breath (4 times daily). INHALE 2 PUFFS BY MOUTH EVERY 4 HOURS AS NEEDED ONLY IF YOU CAN'T CATCH YOUR BREATH 18 g 3   amiodarone  (PACERONE ) 200 MG tablet Take 1 tablet (200 mg total) by mouth daily. 90 tablet 3   apixaban  (ELIQUIS ) 2.5 MG TABS tablet Take 1 tablet (2.5 mg total) by mouth 2 (two) times daily. 180 tablet 1   atorvastatin  (LIPITOR) 20 MG tablet Take 20 mg by mouth daily.      BREZTRI  AEROSPHERE 160-9-4.8 MCG/ACT AERO INHALE 2 PUFFS INTO THE LUNGS TWICE DAILY 10.7 g 11   carvedilol  (COREG ) 12.5 MG tablet Take 12.5 mg by mouth 2 (two) times daily with a meal.     ezetimibe  (ZETIA ) 10 MG tablet Take 10 mg by mouth daily.     FEROSUL 325 (65 Fe) MG tablet Take 325 mg by mouth every other day.     fluticasone  (FLONASE ) 50 MCG/ACT nasal spray SHAKE LIQUID AND USE 2 SPRAYS IN EACH NOSTRIL DAILY 16 g 11   furosemide  (LASIX ) 20 MG tablet Take 20 mg by mouth daily.     guaiFENesin  (MUCINEX ) 600 MG 12 hr tablet Take 1 tablet (600 mg total) by mouth 2 (two) times daily. 30 tablet 0   ipratropium (ATROVENT ) 0.06 % nasal spray Place 2 sprays into both nostrils 4 (four) times daily. 15 mL 12   losartan  (COZAAR ) 25 MG tablet Take 25 mg by mouth daily.     OXYGEN  Inhale 5 L/min into the lungs continuous.     pantoprazole  (PROTONIX ) 40 MG tablet Take 40 mg by mouth daily.     potassium chloride  (KLOR-CON  M) 10 MEQ tablet Take 10 mEq by mouth daily.     sertraline  (ZOLOFT ) 100 MG tablet Take 200 mg by mouth daily.     Cholecalciferol  (VITAMIN D3) 25 MCG (1000 UT) CAPS Take 1,000 Units by mouth daily. (Patient not taking: Reported on 02/04/2024)     guaiFENesin -dextromethorphan  (ROBITUSSIN DM) 100-10 MG/5ML syrup Take 5 mLs by mouth every 4 (four) hours as needed for cough. (Patient not taking: Reported on 02/04/2024) 118 mL 0   No current facility-administered medications for this visit.   Facility-Administered Medications Ordered in Other Visits  Medication Dose Route Frequency Provider  Last Rate Last Admin   sodium chloride  flush (NS) 0.9 %  injection 3 mL  3 mL Intravenous Q12H Rolan Ezra RAMAN, MD         PHYSICAL EXAMINATION: ECOG PERFORMANCE STATUS: 1 - Symptomatic but completely ambulatory  BP 118/60   Pulse 77   Temp 97.6 F (36.4 C) (Temporal)   Resp 16   Ht 5' 4 (1.626 m)   Wt 116 lb 6.4 oz (52.8 kg)   LMP  (LMP Unknown)   SpO2 (!) 9%   BMI 19.98 kg/m    GENERAL:alert, on 5 L of oxygen   LABORATORY DATA:  I have reviewed the data as listed Lab Results  Component Value Date   WBC 6.0 01/20/2024   HGB 10.3 (L) 01/20/2024   HCT 31.8 (L) 01/20/2024   MCV 95.2 01/20/2024   PLT 232 01/20/2024     Chemistry      Component Value Date/Time   NA 141 01/20/2024 1234   NA 136 09/09/2023 1659   NA 138 11/27/2014 1017   K 4.0 01/20/2024 1234   K 4.9 11/27/2014 1017   CL 104 01/20/2024 1234   CL 105 03/31/2012 1410   CO2 32 01/20/2024 1234   CO2 25 11/27/2014 1017   BUN 27 (H) 01/20/2024 1234   BUN 22 09/09/2023 1659   BUN 18.7 11/27/2014 1017   CREATININE 1.41 (H) 01/20/2024 1234   CREATININE 1.0 11/27/2014 1017      Component Value Date/Time   CALCIUM  9.9 01/20/2024 1234   CALCIUM  10.1 11/27/2014 1017   ALKPHOS 34 (L) 01/20/2024 1234   ALKPHOS 35 (L) 11/27/2014 1017   AST 21 01/20/2024 1234   AST 19 11/27/2014 1017   ALT 21 01/20/2024 1234   ALT 12 11/27/2014 1017   BILITOT 0.4 01/20/2024 1234   BILITOT 0.45 11/27/2014 1017       RADIOGRAPHIC STUDIES: I have personally reviewed the radiological images as listed and agreed with the findings in the report. MR ABDOMEN WWO CONTRAST Result Date: 01/31/2024 CLINICAL DATA:  Intra-abdominal and pelvic swelling, mass and lump. New pancreatic mass. EXAM: MRI ABDOMEN WITHOUT AND WITH CONTRAST TECHNIQUE: Multiplanar multisequence MR imaging of the abdomen was performed both before and after the administration of intravenous contrast. CONTRAST:  5mL GADAVIST  GADOBUTROL  1 MMOL/ML IV SOLN COMPARISON:   CT March 13, 2020. FINDINGS: Despite efforts by the technologist and patient, motion artifact is present on today's exam and could not be eliminated. This reduces exam sensitivity and specificity. Lower chest: Heterogeneous signal in the lung bases favored atelectasis. Hepatobiliary: No significant hepatic steatosis or iron deposition. No suspicious hepatic lesion. Cholelithiasis. No biliary ductal dilation. Pancreas: Multi septated cystic lesion in the head of the pancreas measures 2.2 x 1.8 cm on image 21/13 lesion does not demonstrate suspicious postcontrast enhancement. Diffuse pancreatic atrophy. Pancreatic ductal dilation measuring 5 mm in the tail of the pancreas. Multiple additional small cystic foci in the head, body and tail of the pancreas for instance in the tail measuring 3 mm on image 13/13. Spleen:  No splenomegaly. Adrenals/Urinary Tract: No suspicious adrenal nodule/mass. No hydronephrosis. No suspicious renal mass. Stomach/Bowel: No evidence of bowel obstruction. Colonic diverticulosis. Vascular/Lymphatic: Normal caliber abdominal aorta. Smooth IVC contours. The portal, splenic and superior mesenteric veins are patent. No pathologically enlarged abdominal lymph nodes. Other:  No significant abdominal free fluid. Musculoskeletal: Multilevel degenerative changes spine. IMPRESSION: Motion degraded examination limits sensitivity and specificity. Within this context: 1. Multi septated cystic lesion in the head of the pancreas measures 2.2 x 1.8 cm without suspicious postcontrast enhancement.  Compatible with a cystic pancreatic neoplasm possibly an IPMN vs versus mucinous or serous neoplasm. Given the pancreatic ductal dilation would consider further evaluation with EUS/FNA versus follow-up pre and postcontrast MRCP in 6 months. 2. Multiple additional small cystic foci in the head, body and tail of the pancreas measuring up to 3 mm, likely reflecting side branch IPMNs. Attention on follow-up imaging  suggested. 3. Cholelithiasis. 4. Colonic diverticulosis. Electronically Signed   By: Reyes Holder M.D.   On: 01/31/2024 13:47   MR 3D Recon At Scanner Result Date: 01/31/2024 CLINICAL DATA:  Intra-abdominal and pelvic swelling, mass and lump. New pancreatic mass. EXAM: MRI ABDOMEN WITHOUT AND WITH CONTRAST TECHNIQUE: Multiplanar multisequence MR imaging of the abdomen was performed both before and after the administration of intravenous contrast. CONTRAST:  5mL GADAVIST  GADOBUTROL  1 MMOL/ML IV SOLN COMPARISON:  CT March 13, 2020. FINDINGS: Despite efforts by the technologist and patient, motion artifact is present on today's exam and could not be eliminated. This reduces exam sensitivity and specificity. Lower chest: Heterogeneous signal in the lung bases favored atelectasis. Hepatobiliary: No significant hepatic steatosis or iron deposition. No suspicious hepatic lesion. Cholelithiasis. No biliary ductal dilation. Pancreas: Multi septated cystic lesion in the head of the pancreas measures 2.2 x 1.8 cm on image 21/13 lesion does not demonstrate suspicious postcontrast enhancement. Diffuse pancreatic atrophy. Pancreatic ductal dilation measuring 5 mm in the tail of the pancreas. Multiple additional small cystic foci in the head, body and tail of the pancreas for instance in the tail measuring 3 mm on image 13/13. Spleen:  No splenomegaly. Adrenals/Urinary Tract: No suspicious adrenal nodule/mass. No hydronephrosis. No suspicious renal mass. Stomach/Bowel: No evidence of bowel obstruction. Colonic diverticulosis. Vascular/Lymphatic: Normal caliber abdominal aorta. Smooth IVC contours. The portal, splenic and superior mesenteric veins are patent. No pathologically enlarged abdominal lymph nodes. Other:  No significant abdominal free fluid. Musculoskeletal: Multilevel degenerative changes spine. IMPRESSION: Motion degraded examination limits sensitivity and specificity. Within this context: 1. Multi septated  cystic lesion in the head of the pancreas measures 2.2 x 1.8 cm without suspicious postcontrast enhancement. Compatible with a cystic pancreatic neoplasm possibly an IPMN vs versus mucinous or serous neoplasm. Given the pancreatic ductal dilation would consider further evaluation with EUS/FNA versus follow-up pre and postcontrast MRCP in 6 months. 2. Multiple additional small cystic foci in the head, body and tail of the pancreas measuring up to 3 mm, likely reflecting side branch IPMNs. Attention on follow-up imaging suggested. 3. Cholelithiasis. 4. Colonic diverticulosis. Electronically Signed   By: Reyes Holder M.D.   On: 01/31/2024 13:47    All questions were answered. The patient knows to call the clinic with any problems, questions or concerns. I spent 30 minutes in the care of this patient including H and P, review of records, counseling and coordination of care.     Amber Stalls, MD 02/04/2024 1:26 PM

## 2024-02-05 ENCOUNTER — Other Ambulatory Visit: Payer: Self-pay | Admitting: Hematology and Oncology

## 2024-02-05 ENCOUNTER — Encounter: Payer: Self-pay | Admitting: Oncology

## 2024-02-05 DIAGNOSIS — K8689 Other specified diseases of pancreas: Secondary | ICD-10-CM

## 2024-02-05 NOTE — Progress Notes (Signed)
 Discussed with Dr Wilhelmenia Recommended GI referral. Urgent referral placed.

## 2024-02-07 ENCOUNTER — Telehealth: Payer: Self-pay | Admitting: Gastroenterology

## 2024-02-07 NOTE — Telephone Encounter (Signed)
 Dr. Wilhelmenia,  Urgent referral in [place] for EUS for a pancreatitic mass.  Thanks [employer]

## 2024-02-07 NOTE — Telephone Encounter (Signed)
 Imaging is in her chart. Please advise on scheduling. Thanks

## 2024-02-07 NOTE — Telephone Encounter (Signed)
 This patient has been discussed with Dr. Releuko briefly. Pancreatic cyst. High risk individual with multiple comorbidities and recent stroke and anticoagulation. Patient needs to be seen by any GI MD, for evaluation and depending on that evaluation, consideration of whether EUS is necessary or not. Based on size of this lesion, likelihood could be that this could just be monitored with MRI/MRCP. If however other findings at time of clinical history are concerning, consider high risk EUS.  Again, This patient can be scheduled with any GI MD of [facility].  Next available.  No APP visit. GM

## 2024-02-08 ENCOUNTER — Ambulatory Visit: Admitting: Hematology and Oncology

## 2024-02-08 ENCOUNTER — Encounter: Payer: Self-pay | Admitting: Gastroenterology

## 2024-02-08 ENCOUNTER — Other Ambulatory Visit

## 2024-02-08 DIAGNOSIS — E538 Deficiency of other specified B group vitamins: Secondary | ICD-10-CM | POA: Diagnosis not present

## 2024-02-08 NOTE — Telephone Encounter (Signed)
 Beth, This patient has multiple medical comorbidities and is not clear that any type of endoscopic evaluation be at upper or lower is safe on her at this time. But to consider whether there may be role of imaging surveillance, she should be scheduled for clinic visit. She can be scheduled with any GI MD (not an APP), for a new patient's slot. Thanks. GM

## 2024-02-09 ENCOUNTER — Other Ambulatory Visit: Payer: Self-pay | Admitting: Hematology and Oncology

## 2024-02-09 DIAGNOSIS — K8689 Other specified diseases of pancreas: Secondary | ICD-10-CM

## 2024-02-09 DIAGNOSIS — Z87440 Personal history of urinary (tract) infections: Secondary | ICD-10-CM | POA: Diagnosis not present

## 2024-02-09 DIAGNOSIS — R350 Frequency of micturition: Secondary | ICD-10-CM | POA: Diagnosis not present

## 2024-02-09 DIAGNOSIS — N3946 Mixed incontinence: Secondary | ICD-10-CM | POA: Diagnosis not present

## 2024-02-09 NOTE — Progress Notes (Signed)
PET scan ordered 

## 2024-02-10 ENCOUNTER — Ambulatory Visit (INDEPENDENT_AMBULATORY_CARE_PROVIDER_SITE_OTHER): Admitting: Nurse Practitioner

## 2024-02-10 ENCOUNTER — Encounter: Payer: Self-pay | Admitting: Nurse Practitioner

## 2024-02-10 VITALS — BP 102/60 | HR 71 | Temp 97.9°F | Ht 64.0 in | Wt 116.0 lb

## 2024-02-10 DIAGNOSIS — I5022 Chronic systolic (congestive) heart failure: Secondary | ICD-10-CM | POA: Diagnosis not present

## 2024-02-10 DIAGNOSIS — F32A Depression, unspecified: Secondary | ICD-10-CM

## 2024-02-10 DIAGNOSIS — J9611 Chronic respiratory failure with hypoxia: Secondary | ICD-10-CM | POA: Diagnosis not present

## 2024-02-10 DIAGNOSIS — K8689 Other specified diseases of pancreas: Secondary | ICD-10-CM

## 2024-02-10 DIAGNOSIS — J449 Chronic obstructive pulmonary disease, unspecified: Secondary | ICD-10-CM | POA: Diagnosis not present

## 2024-02-10 DIAGNOSIS — R911 Solitary pulmonary nodule: Secondary | ICD-10-CM | POA: Diagnosis not present

## 2024-02-10 DIAGNOSIS — I480 Paroxysmal atrial fibrillation: Secondary | ICD-10-CM

## 2024-02-10 MED ORDER — OHTUVAYRE 3 MG/2.5ML IN SUSP: 2.5000 mL | Freq: Two times a day (BID) | RESPIRATORY_TRACT | Status: AC

## 2024-02-10 NOTE — Telephone Encounter (Signed)
 PI, If PET is concerning, relay this to me sooner and we'll see what needs to be done from there. Thanks. GM

## 2024-02-10 NOTE — Patient Instructions (Addendum)
 Continue Albuterol  inhaler 2 puffs or 3 mL neb every 6 hours as needed for shortness of breath or wheezing. Notify if symptoms persist despite rescue inhaler/neb use.  Continue Breztri  2 puffs Twice daily. Brush tongue and rinse mouth afterwards Continue flonase  nasal spray 2 sprays each nostril daily Continue guaifenesin  1 tab Twice daily as needed for cough/congestion Continue Ipratropium nasal spray 1-2 sprays each nostril Three times a day for nasal congestion/drainage  Continue oxygen  4-5 lpm for goal >88-90%   Start Ohtuvayre  2.5 mL neb Twice daily. Monitor for any mood changes and notify if this occurs. This is a maintenance inhaler that you will use twice daily regardless of your symptoms, with your Breztri . This is not an emergency/rescue inhaler  Follow up with cardiology, oncology and GI as scheduled  Keep a log of your blood pressure at home Start using Ensures or Boosts 2-3 times a day to help supplement your diet    Follow up as scheduled with Dana Austin Pongratz,NP in October. Follow up with Dr. Jude in January 2026. If symptoms do not improve or worsen, please contact office for sooner follow up or seek emergency care.

## 2024-02-10 NOTE — Telephone Encounter (Signed)
 Please followup on this Beth. GM

## 2024-02-10 NOTE — Telephone Encounter (Signed)
 She was contacted on 02/08/24 and scheduled with the first opening which is 04/18/24. We will mail new patient paperwork to her address. We have her on the cancellation list as well. I will continue to watch for something sooner.

## 2024-02-10 NOTE — Progress Notes (Signed)
 @Patient  ID: Dana Pruitt Arts, female    DOB: 1941-03-12, 83 y.o.   MRN: [id number]  Chief Complaint  Patient presents with   COPD    No improvement. Needs supplies for nebulizer     Referring provider: Clarice Nottingham, MD  HPI: 83 year old female, former smoker followed for COPD and chronic respiratory failure. She is a patient of Dr. Chari and last seen in office 11/05/2023 by Dr. Jude. Past medical history significant for CHF, HTN, NICM, PH, PAF on low dose Eliquis , history of TAVR, thoracic aortic aneurysm, HLD, depression, history of breast cancer  TEST/EVENTS:  01/05/2019 PFT: FVC 79, FEV1 60, ratio 62, TLC 86, DLCO 39 12/21/2021 echo: EF 60-65%.  G1 DD.  RV size and function normal.  Normal PASP.  Trivial MR.  Aortic valve has been repaired/replaced.  No significant valvular disease 11/04/2022 CT chest without contrast: Atherosclerosis.  Aortic valve replacement.  Enlarged pulmonic trunk.  Centrilobular emphysema.  Subpleural radiation scarring in the right lung.  Tiny nodules in the right upper lobe, 3 mm or less, unchanged.  4.3 cm ascending aortic aneurysm, stable 08/16/2023 CXR: no acute airspace disease or acute process  08/16/2023 BNP 473, d dimer negative  08/26/2023 echo: EF 30-35%, GIDD. RV size nl, mildly reduced function. Normal PASP. LA mildly dilated. Mild MR.  12/28/2023 echo: EF 40-45%. RV low function. LA and RA dilated. Moderate MR. Moderate to severe TR. Repaired/replaced AV.   07/13/2023: OV with Dr. Darlean. Maintained on Breztri . SOB with exertion and low SpO2 since heart surgery 01/2019. Walk test with desaturation to 85% on room air - titrated up to 4 lpm on POC and was able to maintain >90%. Referred for oxygen . Referral to pulmonary rehab.   08/16/2023: [name] with Hyrum Shaneyfelt, NP. Discussed the use of AI scribe software for clinical note transcription with the patient, who gave verbal consent to proceed. History of Present Illness   Dana Pruitt is an 83 year old female with COPD,  chronic respiratory failure, heart failure, atrial fibrillation, and pulmonary hypertension who presents with oxygen  dependency and respiratory concerns. She is accompanied by her friend, [name]. She was seen in January by Dr. Darlean after her PCP, Dr. Clarice, diagnosed her with possible respiratory infection. She was started on supplemental oxygen  at this visit with POC unit 4 lpm with activity and continuous at night. She has received this but she has a lot of questions/concerns.  She is experiencing issues with oxygen  dependency, having recently started on oxygen  therapy. She uses a home concentrator unit, maintaining oxygen  levels in the low nineties on therapy, but levels drop into the seventies and eighties when she removes the oxygen  during activities like showering or cooking. She finds it difficult to function without oxygen  during these activities. Three weeks ago, she was seen for a suspected respiratory infection and was prescribed antibiotics and steroids by her PCP, which she completed. She feels better since completing the medication but still experiences some respiratory symptoms.  She reports a recent increase in mucus production and sinus congestion over the past few days. Mucus is clear to white. Coughing slightly more than her usual. She doesn't necessarily feel any short winded than her last visit or have more chest congestion. She has not had fever, chills, or body aches. She uses Flonase  nasal spray once a day and takes Mucinex  600 mg twice a day. She's not had any known sick exposures. Denies hemoptysis, leg swelling, orthopnea, weight gain, wheezing. Eating and drinking  well.  She has a significant cardiac history, including heart failure, atrial fibrillation, and a cardiomyopathy, along with pulmonary hypertension. She was hospitalized for double pneumonia approximately ten to twelve years ago, which she thinks is when her respiratory problems started. She also has a history of heavy  smoking, resulting in moderate COPD/emphysema. She's currently on Breztri , which she uses daily. She doesn't use her neb treatments very frequently.  She has been attending pulmonary rehabilitation, where she was using 3-4liters of oxygen  during exercise. She has experienced issues with her pulsed oxygen  concentrator not functioning properly. It has been turning off on her. She's also still had some low oxygen  levels on the pulsed therapy, into the 80's.   08/27/2023: Ov with Wilburn Keir NP Patient presents today for follow up. At her last visit, she had increasing oxygen  requirements and worsening DOE. She had elevated BNP on testing, negative d dimer. CXR without acute process. She had weight gain of 7-8 lb. We treated her with short course of furosemide . She did have improvement in oxygen  requirements and is able to maintain on POC again now at 4 lpm. Her weight is down to 124 lb from 128 lb, indicating improvement. She does feel like her breathing is a little bit better but no drastic change.  She had an echocardiogram yesterday which revealed EF 30-35%, GIDD. PASP nl. She has not seen cardiology yet regarding this but Dr. Court did review this and recommended she be seen for follow up visit. She has not scheduled this yet and had not gotten the results so we reviewed them today. Otherwise symptoms are no worse. She does not have any significant leg swelling, orthopnea, PND, wheezing. No hemoptysis. Eating and drinking well. Her sinus symptoms have improved with the ipratropium nasal spray. She's still using her Breztri  twice a day. Didn't notice any change with last course of prednisone .   02/10/2024: Today - follow up Discussed the use of AI scribe software for clinical note transcription with the patient, who gave verbal consent to proceed.  History of Present Illness Dana Pruitt is an 83 year old female with COPD, emphysema, and significant heart disease who presents for follow up.  She experiences  ongoing shortness of breath with pretty much any activity, which is unchanged from her last visit. No significant cough. She doesn't feel any worse or better since her last visit. She has systolic heart disease, severe COPD, and valvular disease. No increased oxygen  requirements since her visit in May.  She is currently being evaluated for a pancreatic mass, suspicious for neoplasm, and is awaiting a PET scan to determine the next steps. She has not yet seen gastroenterology but has been in contact with Dr. Loretha, who is coordinating with Dr. Wilhelmenia, the GI doctor.  She has not yet completed her lung function test, which is scheduled for October. She continues to manage her COPD with oxygen  therapy and reports no current cough or hemoptysis. She has lost about 10 lb over the last 6 months, which she has related to stress and depression with decrease appetite. No swelling in her legs, worsening orthopnea, PND, palpitations, CP. No changes in bowel habits, yellowing of the skin/eyes, abdominal pain. Stable fatigue.   She is using Breztri  and nebulizer for her COPD management but reports issues with the tubing, which is not functioning properly. She is awaiting new tubing.  She is understandably very stressed about the pancreatic mass and possibilities there. She is also concerned about the progression of her  heart and lung disease. She feels anxious. No SI/HI.     No Known Allergies  Immunization History  Administered Date(s) Administered   Fluad Quad(high Dose 65+) 04/29/2019   INFLUENZA, HIGH DOSE SEASONAL PF 05/02/2014, 05/05/2018, 03/22/2021   Influenza Split 03/22/2012   Influenza, Quadrivalent, Recombinant, Inj, Pf 03/07/2020   Influenza,inj,Quad PF,6+ Mos 05/22/2013   Influenza-Unspecified 05/22/2016   PFIZER(Purple Top)SARS-COV-2 Vaccination 07/11/2019, 07/31/2019   PNEUMOCOCCAL CONJUGATE-20 12/15/2021   Pneumococcal Conjugate-13 06/23/2007   Zoster, Live 06/22/2009    Past  Medical History:  Diagnosis Date   Anemia    several times over the years (10/17/2012)   Anxiety    Breast cancer (HCC) 02/2010   right breast   CAD (coronary artery disease)    a. LHC 12/2018: mild non-obstructive CAD   Carcinoma of breast treated with adjuvant hormone therapy (HCC) 02/2010   Femara    Chronic systolic CHF    a. Echo in 2015: LVEF of 35-40%, b. Echo in 12/2021: LVEF of 60-65%   COPD (chronic obstructive pulmonary disease) (HCC)    Depression    GERD (gastroesophageal reflux disease)    diet controlled, no med   Hollenhorst plaque, left eye    apparently resolved at recent ophthalmology visit   Hyperlipidemia    Hypertension    IDC, RightStage II, 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   Moderate aortic insufficiency    Non-ischemic cardiomyopathy (HCC)    Paroxysmal atrial fibrillation (HCC)    Pneumonia    that's why I'm here (10/17/2012)   S/P AVR (aortic valve replacement) 04/26/2019   Stroke (HCC)    12/2021    Tobacco History: Social History   Tobacco Use  Smoking Status Former   Current packs/day: 0.00   Average packs/day: 3.0 packs/day for 32.1 years (96.3 ttl pk-yrs)   Types: Cigarettes   Start date: 05/19/1958   Quit date: 06/30/1990   Years since quitting: 33.6  Smokeless Tobacco Never  Tobacco Comments   Stopped smoking for 2-3 years during second child.    Counseling given: Not Answered Tobacco comments: Stopped smoking for 2-3 years during second child.    Outpatient Medications Prior to Visit  Medication Sig Dispense Refill   albuterol  (PROVENTIL ) (2.5 MG/3ML) 0.083% nebulizer solution USE 1 VIAL VIA NEBULIZER EVERY 6 HOURS AS NEEDED FOR WHEEZING OR SHORTNESS OF BREATH 270 mL 8   albuterol  (VENTOLIN  HFA) 108 (90 Base) MCG/ACT inhaler Inhale 2 puffs into the lungs every 6 (six) hours as needed for wheezing or shortness of breath (4 times daily). INHALE 2 PUFFS BY MOUTH EVERY 4 HOURS AS NEEDED ONLY IF YOU CAN'T CATCH YOUR BREATH 18 g 3   apixaban   (ELIQUIS ) 2.5 MG TABS tablet Take 1 tablet (2.5 mg total) by mouth 2 (two) times daily. 180 tablet 1   atorvastatin  (LIPITOR) 20 MG tablet Take 20 mg by mouth daily.      carvedilol  (COREG ) 12.5 MG tablet Take 12.5 mg by mouth 2 (two) times daily with a meal.     Cholecalciferol  (VITAMIN D3) 25 MCG (1000 UT) CAPS Take 1,000 Units by mouth daily.     ezetimibe  (ZETIA ) 10 MG tablet Take 10 mg by mouth daily.     FEROSUL 325 (65 Fe) MG tablet Take 325 mg by mouth every other day.     fluticasone  (FLONASE ) 50 MCG/ACT nasal spray SHAKE LIQUID AND USE 2 SPRAYS IN EACH NOSTRIL DAILY 16 g 11   furosemide  (LASIX ) 20 MG tablet Take 20 mg by mouth daily.  guaiFENesin  (MUCINEX ) 600 MG 12 hr tablet Take 1 tablet (600 mg total) by mouth 2 (two) times daily. 30 tablet 0   ipratropium (ATROVENT ) 0.06 % nasal spray Place 2 sprays into both nostrils 4 (four) times daily. 15 mL 12   losartan  (COZAAR ) 25 MG tablet Take 25 mg by mouth daily.     OXYGEN  Inhale 5 L/min into the lungs continuous.     potassium chloride  (KLOR-CON  M) 10 MEQ tablet Take 10 mEq by mouth daily.     sertraline  (ZOLOFT ) 100 MG tablet Take 200 mg by mouth daily.     amiodarone  (PACERONE ) 200 MG tablet Take 1 tablet (200 mg total) by mouth daily. 90 tablet 3   BREZTRI  AEROSPHERE 160-9-4.8 MCG/ACT AERO INHALE 2 PUFFS INTO THE LUNGS TWICE DAILY 10.7 g 11   guaiFENesin -dextromethorphan  (ROBITUSSIN DM) 100-10 MG/5ML syrup Take 5 mLs by mouth every 4 (four) hours as needed for cough. (Patient not taking: Reported on 02/10/2024) 118 mL 0   pantoprazole  (PROTONIX ) 40 MG tablet Take 40 mg by mouth daily. (Patient not taking: Reported on 02/10/2024)     Facility-Administered Medications Prior to Visit  Medication Dose Route Frequency Provider Last Rate Last Admin   sodium chloride  flush (NS) 0.9 % injection 3 mL  3 mL Intravenous Q12H Rolan Ezra RAMAN, MD         Review of Systems: as above; otherwise, negative     Physical Exam:  BP 102/60  (BP Location: Right Arm, Patient Position: Sitting, Cuff Size: Normal)   Pulse 71   Temp 97.9 F (36.6 C) (Oral)   Ht 5' 4 (1.626 m)   Wt 116 lb (52.6 kg)   LMP  (LMP Unknown)   SpO2 96% Comment: 5L POC  BMI 19.91 kg/m   GEN: Pleasant, interactive, well-kempt; chronically-ill appearing;non-toxic and in no acute distress HEENT:  Normocephalic and atraumatic. PERRLA. Sclera white. Nasal turbinates pink, moist and patent bilaterally. No rhinorrhea present. Oropharynx pink and moist, without exudate or edema. No lesions, ulcerations, or postnasal drip.  NECK:  Supple w/ fair ROM. No JVD. No lymphadenopathy.   CV: Irregular rhythm, rate controlled, no m/r/g, no peripheral edema. Pulses intact, +2 bilaterally. No cyanosis, pallor or clubbing. PULMONARY:  Unlabored, regular breathing. Diminished bilaterally A&P w/o wheezes/rales/rhonchi. No accessory muscle use.  GI: BS present and normoactive. Soft, non-tender to palpation. No organomegaly or masses detected. MSK: No erythema, warmth or tenderness. Cap refil <2 sec all extrem. Muscle wasting  Neuro: A/Ox3. No focal deficits noted.   Skin: Warm, no lesions or rashe Psych: Normal affect and behavior. Judgement and thought content appropriate.     Lab Results:  CBC    Component Value Date/Time   WBC 6.0 01/20/2024 1234   RBC 3.34 (L) 01/20/2024 1234   HGB 10.3 (L) 01/20/2024 1234   HGB 10.7 (L) 09/09/2023 1659   HGB 12.8 11/27/2014 1016   HCT 31.8 (L) 01/20/2024 1234   HCT 31.8 (L) 09/09/2023 1659   HCT 37.1 11/27/2014 1016   PLT 232 01/20/2024 1234   PLT 144 (L) 09/09/2023 1659   MCV 95.2 01/20/2024 1234   MCV 97 09/09/2023 1659   MCV 92.8 11/27/2014 1016   MCH 30.8 01/20/2024 1234   MCHC 32.4 01/20/2024 1234   RDW 15.6 (H) 01/20/2024 1234   RDW 14.5 09/09/2023 1659   RDW 12.9 11/27/2014 1016   LYMPHSABS 0.8 01/20/2024 1234   LYMPHSABS 1.4 11/27/2014 1016   MONOABS 0.5 01/20/2024 1234   MONOABS  0.4 11/27/2014 1016   EOSABS  0.2 01/20/2024 1234   EOSABS 0.2 11/27/2014 1016   BASOSABS 0.1 01/20/2024 1234   BASOSABS 0.0 11/27/2014 1016    BMET    Component Value Date/Time   NA 141 01/20/2024 1234   NA 136 09/09/2023 1659   NA 138 11/27/2014 1017   K 4.0 01/20/2024 1234   K 4.9 11/27/2014 1017   CL 104 01/20/2024 1234   CL 105 03/31/2012 1410   CO2 32 01/20/2024 1234   CO2 25 11/27/2014 1017   GLUCOSE 113 (H) 01/20/2024 1234   GLUCOSE 108 11/27/2014 1017   GLUCOSE 97 03/31/2012 1410   BUN 27 (H) 01/20/2024 1234   BUN 22 09/09/2023 1659   BUN 18.7 11/27/2014 1017   CREATININE 1.41 (H) 01/20/2024 1234   CREATININE 1.0 11/27/2014 1017   CALCIUM  9.9 01/20/2024 1234   CALCIUM  10.1 11/27/2014 1017   GFRNONAA 37 (L) 01/20/2024 1234   GFRNONAA 49 02/10/2023 1323   GFRAA 48 (L) 03/13/2020 1504    BNP    Component Value Date/Time   BNP 928.4 (H) 10/23/2023 1011     Imaging:  MR ABDOMEN WWO CONTRAST Result Date: 01/31/2024 CLINICAL DATA:  Intra-abdominal and pelvic swelling, mass and lump. New pancreatic mass. EXAM: MRI ABDOMEN WITHOUT AND WITH CONTRAST TECHNIQUE: Multiplanar multisequence MR imaging of the abdomen was performed both before and after the administration of intravenous contrast. CONTRAST:  5mL GADAVIST  GADOBUTROL  1 MMOL/ML IV SOLN COMPARISON:  CT March 13, 2020. FINDINGS: Despite efforts by the technologist and patient, motion artifact is present on today's exam and could not be eliminated. This reduces exam sensitivity and specificity. Lower chest: Heterogeneous signal in the lung bases favored atelectasis. Hepatobiliary: No significant hepatic steatosis or iron deposition. No suspicious hepatic lesion. Cholelithiasis. No biliary ductal dilation. Pancreas: Multi septated cystic lesion in the head of the pancreas measures 2.2 x 1.8 cm on image 21/13 lesion does not demonstrate suspicious postcontrast enhancement. Diffuse pancreatic atrophy. Pancreatic ductal dilation measuring 5 mm in the  tail of the pancreas. Multiple additional small cystic foci in the head, body and tail of the pancreas for instance in the tail measuring 3 mm on image 13/13. Spleen:  No splenomegaly. Adrenals/Urinary Tract: No suspicious adrenal nodule/mass. No hydronephrosis. No suspicious renal mass. Stomach/Bowel: No evidence of bowel obstruction. Colonic diverticulosis. Vascular/Lymphatic: Normal caliber abdominal aorta. Smooth IVC contours. The portal, splenic and superior mesenteric veins are patent. No pathologically enlarged abdominal lymph nodes. Other:  No significant abdominal free fluid. Musculoskeletal: Multilevel degenerative changes spine. IMPRESSION: Motion degraded examination limits sensitivity and specificity. Within this context: 1. Multi septated cystic lesion in the head of the pancreas measures 2.2 x 1.8 cm without suspicious postcontrast enhancement. Compatible with a cystic pancreatic neoplasm possibly an IPMN vs versus mucinous or serous neoplasm. Given the pancreatic ductal dilation would consider further evaluation with EUS/FNA versus follow-up pre and postcontrast MRCP in 6 months. 2. Multiple additional small cystic foci in the head, body and tail of the pancreas measuring up to 3 mm, likely reflecting side branch IPMNs. Attention on follow-up imaging suggested. 3. Cholelithiasis. 4. Colonic diverticulosis. Electronically Signed   By: Reyes Holder M.D.   On: 01/31/2024 13:47   MR 3D Recon At Scanner Result Date: 01/31/2024 CLINICAL DATA:  Intra-abdominal and pelvic swelling, mass and lump. New pancreatic mass. EXAM: MRI ABDOMEN WITHOUT AND WITH CONTRAST TECHNIQUE: Multiplanar multisequence MR imaging of the abdomen was performed both before and after the administration of  intravenous contrast. CONTRAST:  5mL GADAVIST  GADOBUTROL  1 MMOL/ML IV SOLN COMPARISON:  CT March 13, 2020. FINDINGS: Despite efforts by the technologist and patient, motion artifact is present on today's exam and could not  be eliminated. This reduces exam sensitivity and specificity. Lower chest: Heterogeneous signal in the lung bases favored atelectasis. Hepatobiliary: No significant hepatic steatosis or iron deposition. No suspicious hepatic lesion. Cholelithiasis. No biliary ductal dilation. Pancreas: Multi septated cystic lesion in the head of the pancreas measures 2.2 x 1.8 cm on image 21/13 lesion does not demonstrate suspicious postcontrast enhancement. Diffuse pancreatic atrophy. Pancreatic ductal dilation measuring 5 mm in the tail of the pancreas. Multiple additional small cystic foci in the head, body and tail of the pancreas for instance in the tail measuring 3 mm on image 13/13. Spleen:  No splenomegaly. Adrenals/Urinary Tract: No suspicious adrenal nodule/mass. No hydronephrosis. No suspicious renal mass. Stomach/Bowel: No evidence of bowel obstruction. Colonic diverticulosis. Vascular/Lymphatic: Normal caliber abdominal aorta. Smooth IVC contours. The portal, splenic and superior mesenteric veins are patent. No pathologically enlarged abdominal lymph nodes. Other:  No significant abdominal free fluid. Musculoskeletal: Multilevel degenerative changes spine. IMPRESSION: Motion degraded examination limits sensitivity and specificity. Within this context: 1. Multi septated cystic lesion in the head of the pancreas measures 2.2 x 1.8 cm without suspicious postcontrast enhancement. Compatible with a cystic pancreatic neoplasm possibly an IPMN vs versus mucinous or serous neoplasm. Given the pancreatic ductal dilation would consider further evaluation with EUS/FNA versus follow-up pre and postcontrast MRCP in 6 months. 2. Multiple additional small cystic foci in the head, body and tail of the pancreas measuring up to 3 mm, likely reflecting side branch IPMNs. Attention on follow-up imaging suggested. 3. Cholelithiasis. 4. Colonic diverticulosis. Electronically Signed   By: Reyes Holder M.D.   On: 01/31/2024 13:47     Administration History     None          Latest Ref Rng & Units 01/05/2019    8:37 AM 01/04/2017   10:59 AM  PFT Results  FVC-Pre L 2.13  2.17   FVC-Predicted Pre % 79  79   FVC-Post L 2.10  2.09   FVC-Predicted Post % 78  76   Pre FEV1/FVC % % 57  61   Post FEV1/FCV % % 62  63   FEV1-Pre L 1.22  1.32   FEV1-Predicted Pre % 60  64   FEV1-Post L 1.30  1.31   DLCO uncorrected ml/min/mmHg 7.27  10.22   DLCO UNC% % 39  43   DLCO corrected ml/min/mmHg 7.27  10.04   DLCO COR %Predicted % 39  43   DLVA Predicted % 52  58   TLC L 4.29  5.23   TLC % Predicted % 86  105   RV % Predicted % 88  136     No results found for: NITRICOXIDE      Assessment & Plan:   COPD  GOLD II Moderate obstructive disease with high symptom burden, which is multifactorial related to COPD, CHF, PAF, valvular disease and deconditioning. No acute exacerbation. Will add on inhaled phosphodiesterase inhibitor with Ohtuvayre  and reassess response. Side effect profile reviewed. Enrollment paperwork completed. Continue current regimen. Action plan in place. Lung exam clear today. Graded exercises encouraged. High protein, higher calorie diet.   Patient Instructions  Continue Albuterol  inhaler 2 puffs or 3 mL neb every 6 hours as needed for shortness of breath or wheezing. Notify if symptoms persist despite rescue inhaler/neb use.  Continue Breztri  2 puffs Twice daily. Brush tongue and rinse mouth afterwards Continue flonase  nasal spray 2 sprays each nostril daily Continue guaifenesin  1 tab Twice daily as needed for cough/congestion Continue Ipratropium nasal spray 1-2 sprays each nostril Three times a day for nasal congestion/drainage  Continue oxygen  4-5 lpm for goal >88-90%   Start Ohtuvayre  2.5 mL neb Twice daily. Monitor for any mood changes and notify if this occurs. This is a maintenance inhaler that you will use twice daily regardless of your symptoms, with your Breztri . This is not an  emergency/rescue inhaler  Follow up with cardiology, oncology and GI as scheduled  Keep a log of your blood pressure at home Start using Ensures or Boosts 2-3 times a day to help supplement your diet    Follow up as scheduled with Izetta Rickayla Wieland,NP in October. Follow up with Dr. Jude in January 2026. If symptoms do not improve or worsen, please contact office for sooner follow up or seek emergency care.   Chronic respiratory failure with hypoxia (HCC) Stable oxygen  requirement on supplemental O2 24/7. Goal >88-90%  Chronic systolic CHF (congestive heart failure) (HCC) HFrEF. Euvolemic today. Management complicated by hypotension. Consideration for Advanced HF referral if symptoms worsen, per cards. Contributing to DOE. Lengthy discussion surrounding correlation of HF and DOE. Verbalized understanding. Follow up with cardiology as scheduled  PAF (paroxysmal atrial fibrillation) (HCC) Rate controlled. Follow up with cardiology   Depression Lengthy discussion around COPD disease prognosis and progression. BODE index with 18% 4 year survival rate utilizing prior 6 MWT. Goals of care discussed. She would not want aggressive management for pancreatic mass, pending result. Encouraged her to follow up with oncology/GI after further testing completed to discuss options. No SI/HI. Encouraged to reach out to her PCP if symptoms persist/fail to improve to consider dose adjustment of her Zoloft . Aware to seek care if symptoms worsen.   Pancreatic mass See above. Awaiting PET scan     Advised if symptoms do not improve or worsen, to please contact office for sooner follow up or seek emergency care.   I spent 45 minutes of dedicated to the care of this patient on the date of this encounter to include pre-visit review of records, face-to-face time with the patient discussing conditions above, post visit ordering of testing, clinical documentation with the electronic health record, making appropriate  referrals as documented, and communicating necessary findings to members of the patients care team.  Comer LULLA Rouleau, NP 02/14/2024  Pt aware and understands NP's role.

## 2024-02-11 ENCOUNTER — Other Ambulatory Visit

## 2024-02-11 ENCOUNTER — Ambulatory Visit: Admitting: Hematology and Oncology

## 2024-02-11 ENCOUNTER — Encounter (HOSPITAL_COMMUNITY)
Admission: RE | Admit: 2024-02-11 | Discharge: 2024-02-11 | Disposition: A | Discharge status: Home / Self Care | Source: Ambulatory Visit | Attending: Hematology and Oncology | Admitting: Hematology and Oncology

## 2024-02-11 DIAGNOSIS — I700 Atherosclerosis of aorta: Secondary | ICD-10-CM | POA: Diagnosis not present

## 2024-02-11 DIAGNOSIS — R932 Abnormal findings on diagnostic imaging of liver and biliary tract: Secondary | ICD-10-CM | POA: Insufficient documentation

## 2024-02-11 DIAGNOSIS — C50919 Malignant neoplasm of unspecified site of unspecified female breast: Secondary | ICD-10-CM | POA: Diagnosis not present

## 2024-02-11 DIAGNOSIS — J439 Emphysema, unspecified: Secondary | ICD-10-CM | POA: Insufficient documentation

## 2024-02-11 DIAGNOSIS — J329 Chronic sinusitis, unspecified: Secondary | ICD-10-CM | POA: Diagnosis not present

## 2024-02-11 DIAGNOSIS — I7121 Aneurysm of the ascending aorta, without rupture: Secondary | ICD-10-CM | POA: Insufficient documentation

## 2024-02-11 DIAGNOSIS — K8689 Other specified diseases of pancreas: Secondary | ICD-10-CM | POA: Diagnosis not present

## 2024-02-11 LAB — GLUCOSE, CAPILLARY: Glucose-Capillary: 110 mg/dL — ABNORMAL HIGH (ref 70–99)

## 2024-02-11 MED ORDER — FLUDEOXYGLUCOSE F - 18 (FDG) INJECTION
6.3200 | Freq: Once | INTRAVENOUS | Status: AC | PRN
  Administered 2024-02-11: 6.32 via INTRAVENOUS

## 2024-02-11 NOTE — Telephone Encounter (Signed)
 Thank you for update. If PET/CT scan is abnormal, we will get her worked in as necessary. GM

## 2024-02-14 ENCOUNTER — Telehealth: Payer: Self-pay

## 2024-02-14 ENCOUNTER — Encounter: Payer: Self-pay | Admitting: Nurse Practitioner

## 2024-02-14 DIAGNOSIS — I5020 Unspecified systolic (congestive) heart failure: Secondary | ICD-10-CM | POA: Diagnosis not present

## 2024-02-14 DIAGNOSIS — I10 Essential (primary) hypertension: Secondary | ICD-10-CM | POA: Diagnosis not present

## 2024-02-14 DIAGNOSIS — M810 Age-related osteoporosis without current pathological fracture: Secondary | ICD-10-CM | POA: Diagnosis not present

## 2024-02-14 DIAGNOSIS — J449 Chronic obstructive pulmonary disease, unspecified: Secondary | ICD-10-CM | POA: Diagnosis not present

## 2024-02-14 DIAGNOSIS — K8689 Other specified diseases of pancreas: Secondary | ICD-10-CM | POA: Insufficient documentation

## 2024-02-14 DIAGNOSIS — F411 Generalized anxiety disorder: Secondary | ICD-10-CM | POA: Diagnosis not present

## 2024-02-14 DIAGNOSIS — Z0000 Encounter for general adult medical examination without abnormal findings: Secondary | ICD-10-CM | POA: Diagnosis not present

## 2024-02-14 NOTE — Assessment & Plan Note (Signed)
 See above. Awaiting PET scan

## 2024-02-14 NOTE — Assessment & Plan Note (Addendum)
 Moderate obstructive disease with high symptom burden, which is multifactorial related to COPD, CHF, PAF, valvular disease and deconditioning. No acute exacerbation. Will add on inhaled phosphodiesterase inhibitor with Ohtuvayre  and reassess response. Side effect profile reviewed. Enrollment paperwork completed. Continue current regimen. Action plan in place. Lung exam clear today. Graded exercises encouraged. High protein, higher calorie diet.   Patient Instructions  Continue Albuterol  inhaler 2 puffs or 3 mL neb every 6 hours as needed for shortness of breath or wheezing. Notify if symptoms persist despite rescue inhaler/neb use.  Continue Breztri  2 puffs Twice daily. Brush tongue and rinse mouth afterwards Continue flonase  nasal spray 2 sprays each nostril daily Continue guaifenesin  1 tab Twice daily as needed for cough/congestion Continue Ipratropium nasal spray 1-2 sprays each nostril Three times a day for nasal congestion/drainage  Continue oxygen  4-5 lpm for goal >88-90%   Start Ohtuvayre  2.5 mL neb Twice daily. Monitor for any mood changes and notify if this occurs. This is a maintenance inhaler that you will use twice daily regardless of your symptoms, with your Breztri . This is not an emergency/rescue inhaler  Follow up with cardiology, oncology and GI as scheduled  Keep a log of your blood pressure at home Start using Ensures or Boosts 2-3 times a day to help supplement your diet    Follow up as scheduled with Izetta Hasset Chaviano,NP in October. Follow up with Dr. Jude in January 2026. If symptoms do not improve or worsen, please contact office for sooner follow up or seek emergency care.

## 2024-02-14 NOTE — Assessment & Plan Note (Addendum)
 Lengthy discussion around COPD disease prognosis and progression. BODE index with 18% 4 year survival rate utilizing prior 6 MWT. Goals of care discussed. She would not want aggressive management for pancreatic mass, pending result. Encouraged her to follow up with oncology/GI after further testing completed to discuss options. No SI/HI. Encouraged to reach out to her PCP if symptoms persist/fail to improve to consider dose adjustment of her Zoloft . Aware to seek care if symptoms worsen.

## 2024-02-14 NOTE — Assessment & Plan Note (Signed)
Rate controlled.  Follow-up with cardiology. ?

## 2024-02-14 NOTE — Assessment & Plan Note (Addendum)
 HFrEF. Euvolemic today. Management complicated by hypotension. Consideration for Advanced HF referral if symptoms worsen, per cards. Contributing to DOE. Lengthy discussion surrounding correlation of HF and DOE. Verbalized understanding. Follow up with cardiology as scheduled

## 2024-02-14 NOTE — Assessment & Plan Note (Signed)
 Stable oxygen  requirement on supplemental O2 24/7. Goal >88-90%

## 2024-02-14 NOTE — Telephone Encounter (Signed)
 Received Ohtuvayre  new start paperwork. Completed form and faxed with clinicals and insurance card copy to [facility]   Phone#: [phone] Fax#: [phone]

## 2024-02-15 NOTE — Telephone Encounter (Signed)
 Received fax from [employer] confirming receipt of Ohtuvayre  enrollment form  Patient iD: [id number]

## 2024-02-16 DIAGNOSIS — H34212 Partial retinal artery occlusion, left eye: Secondary | ICD-10-CM | POA: Diagnosis not present

## 2024-02-16 DIAGNOSIS — J309 Allergic rhinitis, unspecified: Secondary | ICD-10-CM | POA: Diagnosis not present

## 2024-02-16 DIAGNOSIS — I480 Paroxysmal atrial fibrillation: Secondary | ICD-10-CM | POA: Diagnosis not present

## 2024-02-16 DIAGNOSIS — M8580 Other specified disorders of bone density and structure, unspecified site: Secondary | ICD-10-CM | POA: Diagnosis not present

## 2024-02-16 DIAGNOSIS — I2489 Other forms of acute ischemic heart disease: Secondary | ICD-10-CM | POA: Diagnosis not present

## 2024-02-16 DIAGNOSIS — Z7901 Long term (current) use of anticoagulants: Secondary | ICD-10-CM | POA: Diagnosis not present

## 2024-02-16 DIAGNOSIS — K219 Gastro-esophageal reflux disease without esophagitis: Secondary | ICD-10-CM | POA: Diagnosis not present

## 2024-02-16 DIAGNOSIS — F419 Anxiety disorder, unspecified: Secondary | ICD-10-CM | POA: Diagnosis not present

## 2024-02-16 DIAGNOSIS — Z7951 Long term (current) use of inhaled steroids: Secondary | ICD-10-CM | POA: Diagnosis not present

## 2024-02-16 DIAGNOSIS — D509 Iron deficiency anemia, unspecified: Secondary | ICD-10-CM | POA: Diagnosis not present

## 2024-02-16 DIAGNOSIS — E43 Unspecified severe protein-calorie malnutrition: Secondary | ICD-10-CM | POA: Diagnosis not present

## 2024-02-16 DIAGNOSIS — I428 Other cardiomyopathies: Secondary | ICD-10-CM | POA: Diagnosis not present

## 2024-02-16 DIAGNOSIS — I2721 Secondary pulmonary arterial hypertension: Secondary | ICD-10-CM | POA: Diagnosis not present

## 2024-02-16 DIAGNOSIS — I2510 Atherosclerotic heart disease of native coronary artery without angina pectoris: Secondary | ICD-10-CM | POA: Diagnosis not present

## 2024-02-16 DIAGNOSIS — E785 Hyperlipidemia, unspecified: Secondary | ICD-10-CM | POA: Diagnosis not present

## 2024-02-16 DIAGNOSIS — I081 Rheumatic disorders of both mitral and tricuspid valves: Secondary | ICD-10-CM | POA: Diagnosis not present

## 2024-02-16 DIAGNOSIS — J441 Chronic obstructive pulmonary disease with (acute) exacerbation: Secondary | ICD-10-CM | POA: Diagnosis not present

## 2024-02-16 DIAGNOSIS — I447 Left bundle-branch block, unspecified: Secondary | ICD-10-CM | POA: Diagnosis not present

## 2024-02-16 DIAGNOSIS — I5042 Chronic combined systolic (congestive) and diastolic (congestive) heart failure: Secondary | ICD-10-CM | POA: Diagnosis not present

## 2024-02-16 DIAGNOSIS — I110 Hypertensive heart disease with heart failure: Secondary | ICD-10-CM | POA: Diagnosis not present

## 2024-02-16 DIAGNOSIS — J9621 Acute and chronic respiratory failure with hypoxia: Secondary | ICD-10-CM | POA: Diagnosis not present

## 2024-02-16 DIAGNOSIS — J439 Emphysema, unspecified: Secondary | ICD-10-CM | POA: Diagnosis not present

## 2024-02-16 DIAGNOSIS — M62838 Other muscle spasm: Secondary | ICD-10-CM | POA: Diagnosis not present

## 2024-02-16 DIAGNOSIS — I712 Thoracic aortic aneurysm, without rupture, unspecified: Secondary | ICD-10-CM | POA: Diagnosis not present

## 2024-02-16 DIAGNOSIS — F32A Depression, unspecified: Secondary | ICD-10-CM | POA: Diagnosis not present

## 2024-02-18 ENCOUNTER — Inpatient Hospital Stay: Admitting: Hematology and Oncology

## 2024-02-18 ENCOUNTER — Ambulatory Visit: Payer: Self-pay | Admitting: Hematology and Oncology

## 2024-02-18 DIAGNOSIS — K8689 Other specified diseases of pancreas: Secondary | ICD-10-CM | POA: Diagnosis not present

## 2024-02-18 NOTE — Progress Notes (Signed)
 [facility] CONSULT NOTE  Patient Care Team: Clarice Nottingham, MD as PCP - [address] Dorn PARAS, MD as PCP - Cardiology (Cardiology) Homsher, Wanda, RN as VBCI Care Management  CHIEF COMPLAINTS/PURPOSE OF CONSULTATION:  Anemia, pancreatic mass, recent stroke.  ASSESSMENT & PLAN:  This is a very pleasant 83 yr female pt with PMH significant for right-sided breast cancer, A-fib on anticoagulation, COPD, iron deficiency anemia referred to hematology for evaluation and recommendations regarding the anemia.  Pancreatic head mass measuring 1.3 cm is noted on the recent CT chest abdomen pelvis.  MRI abdomen pancreatic protocol was recommended.   Assessment and Plan Assessment & Plan Suspected Pancreatic cystic neoplasm NM PET with no hypermetabolic activity associated with cystic pancreatic head lesion. This doesn't exclude intraductal papillary mucinous neoplasm. No findings of active malignancy. I conveyed these results to her. I also sent a message to Dr Wilhelmenia about these PET scans At this time, I encouraged her to follow up with GI   HISTORY OF PRESENTING ILLNESS:  Dana Pruitt 83 y.o. female is here because of IDA.  Discussed the use of AI scribe software for clinical note transcription with the patient, who gave verbal consent to proceed.  History of Present Illness Dana Pruitt is an 83 year old female with lung disease and recent stroke who presents for follow up for PET scan.  No complaints today during telephone visit.  MEDICAL HISTORY:  Past Medical History:  Diagnosis Date   Anemia    several times over the years (10/17/2012)   Anxiety    Breast cancer (HCC) 02/2010   right breast   CAD (coronary artery disease)    a. LHC 12/2018: mild non-obstructive CAD   Carcinoma of breast treated with adjuvant hormone therapy (HCC) 02/2010   Femara    Chronic systolic CHF    a. Echo in 2015: LVEF of 35-40%, b. 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 SN: [id number];  Surgeon: Fleeta Ochoa, Maude, MD;  Location: [facility] OR;  Service: Open Heart Surgery;  Laterality: N/A;   APPENDECTOMY  1974   BREAST BIOPSY Right 02/27/10   Needle core Biopsy; Invasive Mammary; ER/PR Positive, Her-2 Neu negative, Ki-67 22%   BREAST LUMPECTOMY WITH SENTINEL LYMPH NODE BIOPSY Right 07/03/11   Invasive Ductal Carcinoma;0/3 nodes negative,; ER,PR Positive, Her-2 Neg; Ki-67 22%   CESAREAN SECTION  1970; 1974   COLONOSCOPY     DILATION AND CURETTAGE OF UTERUS  1970's   RIGHT HEART CATH AND CORONARY ANGIOGRAPHY N/A 08/31/2023   Procedure: RIGHT HEART CATH AND CORONARY ANGIOGRAPHY;  Surgeon: Wonda Sharper, MD;  Location: [facility] INVASIVE CV LAB;  Service: Cardiovascular;  Laterality: N/A;   RIGHT/LEFT HEART CATH  AND CORONARY ANGIOGRAPHY N/A 01/02/2019   Procedure: RIGHT/LEFT HEART CATH AND CORONARY ANGIOGRAPHY;  Surgeon: Rolan Ezra RAMAN, MD;  Location: [facility] INVASIVE CV LAB;  Service: Cardiovascular;  Laterality: N/A;   TEE WITHOUT CARDIOVERSION N/A 01/31/2019   Procedure: TRANSESOPHAGEAL ECHOCARDIOGRAM (TEE);  Surgeon: Fleeta Ochoa, Maude, MD;  Location: [facility] OR;  Service: Open Heart Surgery;  Laterality: N/A;   TUBAL LIGATION  1974   WISDOM TOOTH  EXTRACTION      SOCIAL HISTORY: Social History   Socioeconomic History   Marital status: Married    Spouse name: Not on file   Number of children: 2   Years of education: college   Highest education level: Bachelor's degree (e.g., BA, AB, BS)  Occupational History    Employer: RETIRED  Tobacco Use   Smoking status: Former    Current packs/day: 0.00    Average packs/day: 3.0 packs/day for 32.1 years (96.3 ttl pk-yrs)    Types: Cigarettes    Start date: 05/19/1958    Quit date: 06/30/1990    Years since quitting: 33.6   Smokeless tobacco: Never   Tobacco comments:    Stopped smoking for 2-3 years during second child.   Vaping Use   Vaping status: Never Used  Substance and Sexual Activity   Alcohol use: Not Currently   Drug use: No   Sexual activity: Yes    Birth control/protection: Post-menopausal    Comment: Menarche age 5, G33,P2, Menopause in 72s, No HRT Married  Other Topics Concern   Not on file  Social History Narrative   [facility] (12/29/16):   Originally from [place], [place]. Moved to [place] at 83 y.o. Does have a dog. Hasn't worked outside the home. Remote parakeet exposure from her children. No mold or hot tub exposure. Enjoys doing yard work & walking.    Social Drivers of Corporate investment banker Strain: Not on file  Food Insecurity: No Food Insecurity (12/27/2023)   Hunger Vital Sign    Worried About Running Out of Food in the Last Year: Never true    Ran Out of Food in the Last Year: Never true  Transportation Needs: No Transportation Needs (12/27/2023)   PRAPARE - Administrator, Civil Service (Medical): No    Lack of Transportation (Non-Medical): No  Physical Activity: Not on file  Stress: Not on file  Social Connections: Moderately Integrated (12/27/2023)   Social Connection and Isolation Panel    Frequency of Communication with Friends and Family: More than three times a week    Frequency of Social Gatherings with Friends and Family: More  than three times a week    Attends Religious Services: Never    Database administrator or Organizations: Yes    Attends Engineer, structural: More than 4 times per year    Marital Status: Married  Catering manager Violence: Not At Risk (12/27/2023)   Humiliation, Afraid, Rape, and Kick questionnaire    Fear of Current or Ex-Partner: No    Emotionally Abused: No    Physically Abused: No    Sexually Abused: No    FAMILY HISTORY: Family History  Problem Relation Age of Onset   Heart disease Mother    Cancer Father    Heart disease Maternal Grandmother    Heart disease Maternal Grandfather    Cancer Paternal Grandmother    Lung disease Neg Hx     ALLERGIES:  has no known allergies.  MEDICATIONS:  Current Outpatient Medications  Medication  Sig Dispense Refill   albuterol  (PROVENTIL ) (2.5 MG/3ML) 0.083% nebulizer solution USE 1 VIAL VIA NEBULIZER EVERY 6 HOURS AS NEEDED FOR WHEEZING OR SHORTNESS OF BREATH 270 mL 8   albuterol  (VENTOLIN  HFA) 108 (90 Base) MCG/ACT inhaler Inhale 2 puffs into the lungs every 6 (six) hours as needed for wheezing or shortness of breath (4 times daily). INHALE 2 PUFFS BY MOUTH EVERY 4 HOURS AS NEEDED ONLY IF YOU CAN'T CATCH YOUR BREATH 18 g 3   amiodarone  (PACERONE ) 200 MG tablet Take 1 tablet (200 mg total) by mouth daily. 90 tablet 3   apixaban  (ELIQUIS ) 2.5 MG TABS tablet Take 1 tablet (2.5 mg total) by mouth 2 (two) times daily. 180 tablet 1   atorvastatin  (LIPITOR) 20 MG tablet Take 20 mg by mouth daily.      BREZTRI  AEROSPHERE 160-9-4.8 MCG/ACT AERO INHALE 2 PUFFS INTO THE LUNGS TWICE DAILY 10.7 g 11   carvedilol  (COREG ) 12.5 MG tablet Take 12.5 mg by mouth 2 (two) times daily with a meal.     Cholecalciferol  (VITAMIN D3) 25 MCG (1000 UT) CAPS Take 1,000 Units by mouth daily.     Ensifentrine  (OHTUVAYRE ) 3 MG/2.5ML SUSP Inhale 2.5 mLs into the lungs 2 (two) times daily.     ezetimibe  (ZETIA ) 10 MG tablet Take 10 mg by mouth daily.     FEROSUL  325 (65 Fe) MG tablet Take 325 mg by mouth every other day.     fluticasone  (FLONASE ) 50 MCG/ACT nasal spray SHAKE LIQUID AND USE 2 SPRAYS IN EACH NOSTRIL DAILY 16 g 11   furosemide  (LASIX ) 20 MG tablet Take 20 mg by mouth daily.     guaiFENesin  (MUCINEX ) 600 MG 12 hr tablet Take 1 tablet (600 mg total) by mouth 2 (two) times daily. 30 tablet 0   guaiFENesin -dextromethorphan  (ROBITUSSIN DM) 100-10 MG/5ML syrup Take 5 mLs by mouth every 4 (four) hours as needed for cough. (Patient not taking: Reported on 02/10/2024) 118 mL 0   ipratropium (ATROVENT ) 0.06 % nasal spray Place 2 sprays into both nostrils 4 (four) times daily. 15 mL 12   losartan  (COZAAR ) 25 MG tablet Take 25 mg by mouth daily.     OXYGEN  Inhale 5 L/min into the lungs continuous.     pantoprazole  (PROTONIX ) 40 MG tablet Take 40 mg by mouth daily. (Patient not taking: Reported on 02/10/2024)     potassium chloride  (KLOR-CON  M) 10 MEQ tablet Take 10 mEq by mouth daily.     sertraline  (ZOLOFT ) 100 MG tablet Take 200 mg by mouth daily.     No current facility-administered medications for this visit.   Facility-Administered Medications Ordered in Other Visits  Medication Dose Route Frequency Provider Last Rate Last Admin   sodium chloride  flush (NS) 0.9 % injection 3 mL  3 mL Intravenous Q12H Rolan Ezra RAMAN, MD         PHYSICAL EXAMINATION: ECOG PERFORMANCE STATUS: 1 - Symptomatic but completely ambulatory  LMP  (LMP Unknown)   V.S and PE deferred, telephone visit.  LABORATORY DATA:  I have reviewed the data as listed Lab Results  Component Value Date   WBC 6.0 01/20/2024   HGB 10.3 (L) 01/20/2024   HCT 31.8 (L) 01/20/2024   MCV 95.2 01/20/2024   PLT 232 01/20/2024     Chemistry      Component Value Date/Time   NA 141 01/20/2024 1234   NA 136 09/09/2023 1659   NA 138 11/27/2014 1017   K 4.0 01/20/2024  1234   K 4.9 11/27/2014 1017   CL 104 01/20/2024 1234   CL 105 03/31/2012 1410   CO2 32 01/20/2024 1234   CO2 25  11/27/2014 1017   BUN 27 (H) 01/20/2024 1234   BUN 22 09/09/2023 1659   BUN 18.7 11/27/2014 1017   CREATININE 1.41 (H) 01/20/2024 1234   CREATININE 1.0 11/27/2014 1017      Component Value Date/Time   CALCIUM  9.9 01/20/2024 1234   CALCIUM  10.1 11/27/2014 1017   ALKPHOS 34 (L) 01/20/2024 1234   ALKPHOS 35 (L) 11/27/2014 1017   AST 21 01/20/2024 1234   AST 19 11/27/2014 1017   ALT 21 01/20/2024 1234   ALT 12 11/27/2014 1017   BILITOT 0.4 01/20/2024 1234   BILITOT 0.45 11/27/2014 1017       RADIOGRAPHIC STUDIES: I have personally reviewed the radiological images as listed and agreed with the findings in the report. MR ABDOMEN WWO CONTRAST Result Date: 01/31/2024 CLINICAL DATA:  Intra-abdominal and pelvic swelling, mass and lump. New pancreatic mass. EXAM: MRI ABDOMEN WITHOUT AND WITH CONTRAST TECHNIQUE: Multiplanar multisequence MR imaging of the abdomen was performed both before and after the administration of intravenous contrast. CONTRAST:  5mL GADAVIST  GADOBUTROL  1 MMOL/ML IV SOLN COMPARISON:  CT March 13, 2020. FINDINGS: Despite efforts by the technologist and patient, motion artifact is present on today's exam and could not be eliminated. This reduces exam sensitivity and specificity. Lower chest: Heterogeneous signal in the lung bases favored atelectasis. Hepatobiliary: No significant hepatic steatosis or iron deposition. No suspicious hepatic lesion. Cholelithiasis. No biliary ductal dilation. Pancreas: Multi septated cystic lesion in the head of the pancreas measures 2.2 x 1.8 cm on image 21/13 lesion does not demonstrate suspicious postcontrast enhancement. Diffuse pancreatic atrophy. Pancreatic ductal dilation measuring 5 mm in the tail of the pancreas. Multiple additional small cystic foci in the head, body and tail of the pancreas for instance in the tail measuring 3 mm on image 13/13. Spleen:  No splenomegaly. Adrenals/Urinary Tract: No suspicious adrenal nodule/mass. No  hydronephrosis. No suspicious renal mass. Stomach/Bowel: No evidence of bowel obstruction. Colonic diverticulosis. Vascular/Lymphatic: Normal caliber abdominal aorta. Smooth IVC contours. The portal, splenic and superior mesenteric veins are patent. No pathologically enlarged abdominal lymph nodes. Other:  No significant abdominal free fluid. Musculoskeletal: Multilevel degenerative changes spine. IMPRESSION: Motion degraded examination limits sensitivity and specificity. Within this context: 1. Multi septated cystic lesion in the head of the pancreas measures 2.2 x 1.8 cm without suspicious postcontrast enhancement. Compatible with a cystic pancreatic neoplasm possibly an IPMN vs versus mucinous or serous neoplasm. Given the pancreatic ductal dilation would consider further evaluation with EUS/FNA versus follow-up pre and postcontrast MRCP in 6 months. 2. Multiple additional small cystic foci in the head, body and tail of the pancreas measuring up to 3 mm, likely reflecting side branch IPMNs. Attention on follow-up imaging suggested. 3. Cholelithiasis. 4. Colonic diverticulosis. Electronically Signed   By: Reyes Holder M.D.   On: 01/31/2024 13:47   MR 3D Recon At Scanner Result Date: 01/31/2024 CLINICAL DATA:  Intra-abdominal and pelvic swelling, mass and lump. New pancreatic mass. EXAM: MRI ABDOMEN WITHOUT AND WITH CONTRAST TECHNIQUE: Multiplanar multisequence MR imaging of the abdomen was performed both before and after the administration of intravenous contrast. CONTRAST:  5mL GADAVIST  GADOBUTROL  1 MMOL/ML IV SOLN COMPARISON:  CT March 13, 2020. FINDINGS: Despite efforts by the technologist and patient, motion artifact is present on today's exam and could not be eliminated. This reduces  exam sensitivity and specificity. Lower chest: Heterogeneous signal in the lung bases favored atelectasis. Hepatobiliary: No significant hepatic steatosis or iron deposition. No suspicious hepatic lesion. Cholelithiasis.  No biliary ductal dilation. Pancreas: Multi septated cystic lesion in the head of the pancreas measures 2.2 x 1.8 cm on image 21/13 lesion does not demonstrate suspicious postcontrast enhancement. Diffuse pancreatic atrophy. Pancreatic ductal dilation measuring 5 mm in the tail of the pancreas. Multiple additional small cystic foci in the head, body and tail of the pancreas for instance in the tail measuring 3 mm on image 13/13. Spleen:  No splenomegaly. Adrenals/Urinary Tract: No suspicious adrenal nodule/mass. No hydronephrosis. No suspicious renal mass. Stomach/Bowel: No evidence of bowel obstruction. Colonic diverticulosis. Vascular/Lymphatic: Normal caliber abdominal aorta. Smooth IVC contours. The portal, splenic and superior mesenteric veins are patent. No pathologically enlarged abdominal lymph nodes. Other:  No significant abdominal free fluid. Musculoskeletal: Multilevel degenerative changes spine. IMPRESSION: Motion degraded examination limits sensitivity and specificity. Within this context: 1. Multi septated cystic lesion in the head of the pancreas measures 2.2 x 1.8 cm without suspicious postcontrast enhancement. Compatible with a cystic pancreatic neoplasm possibly an IPMN vs versus mucinous or serous neoplasm. Given the pancreatic ductal dilation would consider further evaluation with EUS/FNA versus follow-up pre and postcontrast MRCP in 6 months. 2. Multiple additional small cystic foci in the head, body and tail of the pancreas measuring up to 3 mm, likely reflecting side branch IPMNs. Attention on follow-up imaging suggested. 3. Cholelithiasis. 4. Colonic diverticulosis. Electronically Signed   By: Reyes Holder M.D.   On: 01/31/2024 13:47    All questions were answered. The patient knows to call the clinic with any problems, questions or concerns. I spent 10 minutes in the care of this patient including H and P, review of records, counseling and coordination of care.   I connected with   Romero GORMAN Arts on 02/22/24 by a telephone application and verified that I am speaking with the correct person using two identifiers.   I discussed the limitations of evaluation and management by telemedicine. The patient expressed understanding and agreed to proceed.  Location of pt: Home Location of provider: office.   Amber Stalls, MD 02/18/2024 1:26 PM

## 2024-02-22 ENCOUNTER — Encounter: Payer: Self-pay | Admitting: Oncology

## 2024-02-24 ENCOUNTER — Telehealth: Admitting: Hematology and Oncology

## 2024-02-24 DIAGNOSIS — I5042 Chronic combined systolic (congestive) and diastolic (congestive) heart failure: Secondary | ICD-10-CM | POA: Diagnosis not present

## 2024-02-24 DIAGNOSIS — E43 Unspecified severe protein-calorie malnutrition: Secondary | ICD-10-CM | POA: Diagnosis not present

## 2024-02-24 DIAGNOSIS — I428 Other cardiomyopathies: Secondary | ICD-10-CM | POA: Diagnosis not present

## 2024-02-24 DIAGNOSIS — I447 Left bundle-branch block, unspecified: Secondary | ICD-10-CM | POA: Diagnosis not present

## 2024-02-24 DIAGNOSIS — I480 Paroxysmal atrial fibrillation: Secondary | ICD-10-CM | POA: Diagnosis not present

## 2024-02-24 DIAGNOSIS — I081 Rheumatic disorders of both mitral and tricuspid valves: Secondary | ICD-10-CM | POA: Diagnosis not present

## 2024-02-24 DIAGNOSIS — K219 Gastro-esophageal reflux disease without esophagitis: Secondary | ICD-10-CM | POA: Diagnosis not present

## 2024-02-24 DIAGNOSIS — M8580 Other specified disorders of bone density and structure, unspecified site: Secondary | ICD-10-CM | POA: Diagnosis not present

## 2024-02-24 DIAGNOSIS — I2489 Other forms of acute ischemic heart disease: Secondary | ICD-10-CM | POA: Diagnosis not present

## 2024-02-24 DIAGNOSIS — I712 Thoracic aortic aneurysm, without rupture, unspecified: Secondary | ICD-10-CM | POA: Diagnosis not present

## 2024-02-24 DIAGNOSIS — J441 Chronic obstructive pulmonary disease with (acute) exacerbation: Secondary | ICD-10-CM | POA: Diagnosis not present

## 2024-02-24 DIAGNOSIS — F419 Anxiety disorder, unspecified: Secondary | ICD-10-CM | POA: Diagnosis not present

## 2024-02-24 DIAGNOSIS — I2721 Secondary pulmonary arterial hypertension: Secondary | ICD-10-CM | POA: Diagnosis not present

## 2024-02-24 DIAGNOSIS — F32A Depression, unspecified: Secondary | ICD-10-CM | POA: Diagnosis not present

## 2024-02-24 DIAGNOSIS — D509 Iron deficiency anemia, unspecified: Secondary | ICD-10-CM | POA: Diagnosis not present

## 2024-02-24 DIAGNOSIS — I110 Hypertensive heart disease with heart failure: Secondary | ICD-10-CM | POA: Diagnosis not present

## 2024-02-24 DIAGNOSIS — J9621 Acute and chronic respiratory failure with hypoxia: Secondary | ICD-10-CM | POA: Diagnosis not present

## 2024-02-24 DIAGNOSIS — Z7951 Long term (current) use of inhaled steroids: Secondary | ICD-10-CM | POA: Diagnosis not present

## 2024-02-24 DIAGNOSIS — E785 Hyperlipidemia, unspecified: Secondary | ICD-10-CM | POA: Diagnosis not present

## 2024-02-24 DIAGNOSIS — J439 Emphysema, unspecified: Secondary | ICD-10-CM | POA: Diagnosis not present

## 2024-02-24 DIAGNOSIS — Z7901 Long term (current) use of anticoagulants: Secondary | ICD-10-CM | POA: Diagnosis not present

## 2024-02-24 DIAGNOSIS — J309 Allergic rhinitis, unspecified: Secondary | ICD-10-CM | POA: Diagnosis not present

## 2024-02-24 DIAGNOSIS — I2510 Atherosclerotic heart disease of native coronary artery without angina pectoris: Secondary | ICD-10-CM | POA: Diagnosis not present

## 2024-02-24 DIAGNOSIS — H34212 Partial retinal artery occlusion, left eye: Secondary | ICD-10-CM | POA: Diagnosis not present

## 2024-02-24 DIAGNOSIS — M62838 Other muscle spasm: Secondary | ICD-10-CM | POA: Diagnosis not present

## 2024-02-25 DIAGNOSIS — E785 Hyperlipidemia, unspecified: Secondary | ICD-10-CM | POA: Diagnosis not present

## 2024-02-25 DIAGNOSIS — M8580 Other specified disorders of bone density and structure, unspecified site: Secondary | ICD-10-CM | POA: Diagnosis not present

## 2024-02-25 DIAGNOSIS — I480 Paroxysmal atrial fibrillation: Secondary | ICD-10-CM | POA: Diagnosis not present

## 2024-02-25 DIAGNOSIS — I081 Rheumatic disorders of both mitral and tricuspid valves: Secondary | ICD-10-CM | POA: Diagnosis not present

## 2024-02-25 DIAGNOSIS — F419 Anxiety disorder, unspecified: Secondary | ICD-10-CM | POA: Diagnosis not present

## 2024-02-25 DIAGNOSIS — Z7951 Long term (current) use of inhaled steroids: Secondary | ICD-10-CM | POA: Diagnosis not present

## 2024-02-25 DIAGNOSIS — J309 Allergic rhinitis, unspecified: Secondary | ICD-10-CM | POA: Diagnosis not present

## 2024-02-25 DIAGNOSIS — K219 Gastro-esophageal reflux disease without esophagitis: Secondary | ICD-10-CM | POA: Diagnosis not present

## 2024-02-25 DIAGNOSIS — M62838 Other muscle spasm: Secondary | ICD-10-CM | POA: Diagnosis not present

## 2024-02-25 DIAGNOSIS — E43 Unspecified severe protein-calorie malnutrition: Secondary | ICD-10-CM | POA: Diagnosis not present

## 2024-02-25 DIAGNOSIS — I5042 Chronic combined systolic (congestive) and diastolic (congestive) heart failure: Secondary | ICD-10-CM | POA: Diagnosis not present

## 2024-02-25 DIAGNOSIS — D509 Iron deficiency anemia, unspecified: Secondary | ICD-10-CM | POA: Diagnosis not present

## 2024-02-25 DIAGNOSIS — I712 Thoracic aortic aneurysm, without rupture, unspecified: Secondary | ICD-10-CM | POA: Diagnosis not present

## 2024-02-25 DIAGNOSIS — F32A Depression, unspecified: Secondary | ICD-10-CM | POA: Diagnosis not present

## 2024-02-25 DIAGNOSIS — I2489 Other forms of acute ischemic heart disease: Secondary | ICD-10-CM | POA: Diagnosis not present

## 2024-02-25 DIAGNOSIS — J441 Chronic obstructive pulmonary disease with (acute) exacerbation: Secondary | ICD-10-CM | POA: Diagnosis not present

## 2024-02-25 DIAGNOSIS — H34212 Partial retinal artery occlusion, left eye: Secondary | ICD-10-CM | POA: Diagnosis not present

## 2024-02-25 DIAGNOSIS — I2721 Secondary pulmonary arterial hypertension: Secondary | ICD-10-CM | POA: Diagnosis not present

## 2024-02-25 DIAGNOSIS — Z7901 Long term (current) use of anticoagulants: Secondary | ICD-10-CM | POA: Diagnosis not present

## 2024-02-25 DIAGNOSIS — I110 Hypertensive heart disease with heart failure: Secondary | ICD-10-CM | POA: Diagnosis not present

## 2024-02-25 DIAGNOSIS — I447 Left bundle-branch block, unspecified: Secondary | ICD-10-CM | POA: Diagnosis not present

## 2024-02-25 DIAGNOSIS — J439 Emphysema, unspecified: Secondary | ICD-10-CM | POA: Diagnosis not present

## 2024-02-25 DIAGNOSIS — J9621 Acute and chronic respiratory failure with hypoxia: Secondary | ICD-10-CM | POA: Diagnosis not present

## 2024-02-25 DIAGNOSIS — I428 Other cardiomyopathies: Secondary | ICD-10-CM | POA: Diagnosis not present

## 2024-02-25 DIAGNOSIS — I2510 Atherosclerotic heart disease of native coronary artery without angina pectoris: Secondary | ICD-10-CM | POA: Diagnosis not present

## 2024-02-28 DIAGNOSIS — J439 Emphysema, unspecified: Secondary | ICD-10-CM | POA: Diagnosis not present

## 2024-02-28 DIAGNOSIS — I2489 Other forms of acute ischemic heart disease: Secondary | ICD-10-CM | POA: Diagnosis not present

## 2024-02-28 DIAGNOSIS — J9621 Acute and chronic respiratory failure with hypoxia: Secondary | ICD-10-CM | POA: Diagnosis not present

## 2024-02-28 DIAGNOSIS — I712 Thoracic aortic aneurysm, without rupture, unspecified: Secondary | ICD-10-CM | POA: Diagnosis not present

## 2024-02-28 DIAGNOSIS — K219 Gastro-esophageal reflux disease without esophagitis: Secondary | ICD-10-CM | POA: Diagnosis not present

## 2024-02-28 DIAGNOSIS — I447 Left bundle-branch block, unspecified: Secondary | ICD-10-CM | POA: Diagnosis not present

## 2024-02-28 DIAGNOSIS — J309 Allergic rhinitis, unspecified: Secondary | ICD-10-CM | POA: Diagnosis not present

## 2024-02-28 DIAGNOSIS — Z7951 Long term (current) use of inhaled steroids: Secondary | ICD-10-CM | POA: Diagnosis not present

## 2024-02-28 DIAGNOSIS — D509 Iron deficiency anemia, unspecified: Secondary | ICD-10-CM | POA: Diagnosis not present

## 2024-02-28 DIAGNOSIS — F32A Depression, unspecified: Secondary | ICD-10-CM | POA: Diagnosis not present

## 2024-02-28 DIAGNOSIS — M8580 Other specified disorders of bone density and structure, unspecified site: Secondary | ICD-10-CM | POA: Diagnosis not present

## 2024-02-28 DIAGNOSIS — I081 Rheumatic disorders of both mitral and tricuspid valves: Secondary | ICD-10-CM | POA: Diagnosis not present

## 2024-02-28 DIAGNOSIS — H34212 Partial retinal artery occlusion, left eye: Secondary | ICD-10-CM | POA: Diagnosis not present

## 2024-02-28 DIAGNOSIS — I2721 Secondary pulmonary arterial hypertension: Secondary | ICD-10-CM | POA: Diagnosis not present

## 2024-02-28 DIAGNOSIS — I428 Other cardiomyopathies: Secondary | ICD-10-CM | POA: Diagnosis not present

## 2024-02-28 DIAGNOSIS — J441 Chronic obstructive pulmonary disease with (acute) exacerbation: Secondary | ICD-10-CM | POA: Diagnosis not present

## 2024-02-28 DIAGNOSIS — I110 Hypertensive heart disease with heart failure: Secondary | ICD-10-CM | POA: Diagnosis not present

## 2024-02-28 DIAGNOSIS — I480 Paroxysmal atrial fibrillation: Secondary | ICD-10-CM | POA: Diagnosis not present

## 2024-02-28 DIAGNOSIS — E785 Hyperlipidemia, unspecified: Secondary | ICD-10-CM | POA: Diagnosis not present

## 2024-02-28 DIAGNOSIS — I2510 Atherosclerotic heart disease of native coronary artery without angina pectoris: Secondary | ICD-10-CM | POA: Diagnosis not present

## 2024-02-28 DIAGNOSIS — F419 Anxiety disorder, unspecified: Secondary | ICD-10-CM | POA: Diagnosis not present

## 2024-02-28 DIAGNOSIS — M62838 Other muscle spasm: Secondary | ICD-10-CM | POA: Diagnosis not present

## 2024-02-28 DIAGNOSIS — I5042 Chronic combined systolic (congestive) and diastolic (congestive) heart failure: Secondary | ICD-10-CM | POA: Diagnosis not present

## 2024-02-28 DIAGNOSIS — E43 Unspecified severe protein-calorie malnutrition: Secondary | ICD-10-CM | POA: Diagnosis not present

## 2024-02-28 DIAGNOSIS — Z7901 Long term (current) use of anticoagulants: Secondary | ICD-10-CM | POA: Diagnosis not present

## 2024-02-29 DIAGNOSIS — M62838 Other muscle spasm: Secondary | ICD-10-CM | POA: Diagnosis not present

## 2024-02-29 DIAGNOSIS — E785 Hyperlipidemia, unspecified: Secondary | ICD-10-CM | POA: Diagnosis not present

## 2024-02-29 DIAGNOSIS — I2721 Secondary pulmonary arterial hypertension: Secondary | ICD-10-CM | POA: Diagnosis not present

## 2024-02-29 DIAGNOSIS — I2510 Atherosclerotic heart disease of native coronary artery without angina pectoris: Secondary | ICD-10-CM | POA: Diagnosis not present

## 2024-02-29 DIAGNOSIS — Z7951 Long term (current) use of inhaled steroids: Secondary | ICD-10-CM | POA: Diagnosis not present

## 2024-02-29 DIAGNOSIS — M8580 Other specified disorders of bone density and structure, unspecified site: Secondary | ICD-10-CM | POA: Diagnosis not present

## 2024-02-29 DIAGNOSIS — K219 Gastro-esophageal reflux disease without esophagitis: Secondary | ICD-10-CM | POA: Diagnosis not present

## 2024-02-29 DIAGNOSIS — J439 Emphysema, unspecified: Secondary | ICD-10-CM | POA: Diagnosis not present

## 2024-02-29 DIAGNOSIS — H34212 Partial retinal artery occlusion, left eye: Secondary | ICD-10-CM | POA: Diagnosis not present

## 2024-02-29 DIAGNOSIS — F32A Depression, unspecified: Secondary | ICD-10-CM | POA: Diagnosis not present

## 2024-02-29 DIAGNOSIS — I428 Other cardiomyopathies: Secondary | ICD-10-CM | POA: Diagnosis not present

## 2024-02-29 DIAGNOSIS — I110 Hypertensive heart disease with heart failure: Secondary | ICD-10-CM | POA: Diagnosis not present

## 2024-02-29 DIAGNOSIS — D509 Iron deficiency anemia, unspecified: Secondary | ICD-10-CM | POA: Diagnosis not present

## 2024-02-29 DIAGNOSIS — J9621 Acute and chronic respiratory failure with hypoxia: Secondary | ICD-10-CM | POA: Diagnosis not present

## 2024-02-29 DIAGNOSIS — F419 Anxiety disorder, unspecified: Secondary | ICD-10-CM | POA: Diagnosis not present

## 2024-02-29 DIAGNOSIS — I447 Left bundle-branch block, unspecified: Secondary | ICD-10-CM | POA: Diagnosis not present

## 2024-02-29 DIAGNOSIS — I480 Paroxysmal atrial fibrillation: Secondary | ICD-10-CM | POA: Diagnosis not present

## 2024-02-29 DIAGNOSIS — I712 Thoracic aortic aneurysm, without rupture, unspecified: Secondary | ICD-10-CM | POA: Diagnosis not present

## 2024-02-29 DIAGNOSIS — J441 Chronic obstructive pulmonary disease with (acute) exacerbation: Secondary | ICD-10-CM | POA: Diagnosis not present

## 2024-02-29 DIAGNOSIS — E43 Unspecified severe protein-calorie malnutrition: Secondary | ICD-10-CM | POA: Diagnosis not present

## 2024-02-29 DIAGNOSIS — Z7901 Long term (current) use of anticoagulants: Secondary | ICD-10-CM | POA: Diagnosis not present

## 2024-02-29 DIAGNOSIS — I5042 Chronic combined systolic (congestive) and diastolic (congestive) heart failure: Secondary | ICD-10-CM | POA: Diagnosis not present

## 2024-02-29 DIAGNOSIS — I081 Rheumatic disorders of both mitral and tricuspid valves: Secondary | ICD-10-CM | POA: Diagnosis not present

## 2024-02-29 DIAGNOSIS — I2489 Other forms of acute ischemic heart disease: Secondary | ICD-10-CM | POA: Diagnosis not present

## 2024-02-29 DIAGNOSIS — J309 Allergic rhinitis, unspecified: Secondary | ICD-10-CM | POA: Diagnosis not present

## 2024-03-07 ENCOUNTER — Other Ambulatory Visit: Payer: Self-pay | Admitting: Internal Medicine

## 2024-04-03 ENCOUNTER — Encounter: Payer: Self-pay | Admitting: Nurse Practitioner

## 2024-04-03 ENCOUNTER — Ambulatory Visit: Admitting: *Deleted

## 2024-04-03 ENCOUNTER — Ambulatory Visit: Admitting: Nurse Practitioner

## 2024-04-03 VITALS — BP 102/60 | HR 63 | Temp 98.1°F | Ht 63.0 in | Wt 117.0 lb

## 2024-04-03 DIAGNOSIS — J449 Chronic obstructive pulmonary disease, unspecified: Secondary | ICD-10-CM | POA: Diagnosis not present

## 2024-04-03 DIAGNOSIS — J9611 Chronic respiratory failure with hypoxia: Secondary | ICD-10-CM

## 2024-04-03 DIAGNOSIS — J4489 Other specified chronic obstructive pulmonary disease: Secondary | ICD-10-CM

## 2024-04-03 DIAGNOSIS — I5022 Chronic systolic (congestive) heart failure: Secondary | ICD-10-CM

## 2024-04-03 DIAGNOSIS — I480 Paroxysmal atrial fibrillation: Secondary | ICD-10-CM

## 2024-04-03 LAB — PULMONARY FUNCTION TEST
DL/VA % pred: 30 %
DL/VA: 1.23 ml/min/mmHg/L
DLCO cor % pred: 23 %
DLCO cor: 4.35 ml/min/mmHg
DLCO unc % pred: 23 %
DLCO unc: 4.35 ml/min/mmHg
FEF 25-75 Pre: 0.6 L/s
FEF2575-%Pred-Pre: 45 %
FEV1-%Pred-Pre: 64 %
FEV1-Pre: 1.18 L
FEV1FVC-%Pred-Pre: 89 %
FEV6-%Pred-Pre: 76 %
FEV6-Pre: 1.79 L
FEV6FVC-%Pred-Pre: 105 %
FVC-%Pred-Pre: 72 %
FVC-Pre: 1.79 L
Pre FEV1/FVC ratio: 66 %
Pre FEV6/FVC Ratio: 100 %

## 2024-04-03 NOTE — Progress Notes (Signed)
 @Patient  ID: Dana Pruitt, female    DOB: [birth year], 83 y.o.   MRN: [id number]  Chief Complaint  Patient presents with   COPD    PFT f/u Pt states breathing has been okay some days are worse than others SOB occurs when pt is overexerting herself   Pt is on 5L of O2    Referring provider: Clarice Nottingham, MD  HPI: 83 year old female, former smoker followed for COPD and chronic respiratory failure. She is a patient of Dr. Cyndi and last seen in office 02/10/2024 by [facility] NP. Past medical history significant for CHF, HTN, NICM, PH, PAF on low dose Eliquis , history of TAVR, thoracic aortic aneurysm, HLD, depression, history of breast cancer  TEST/EVENTS:  01/05/2019 PFT: FVC 79, FEV1 60, ratio 62, TLC 86, DLCO 39 12/21/2021 echo: EF 60-65%.  G1 DD.  RV size and function normal.  Normal PASP.  Trivial MR.  Aortic valve has been repaired/replaced.  No significant valvular disease 11/04/2022 CT chest without contrast: Atherosclerosis.  Aortic valve replacement.  Enlarged pulmonic trunk.  Centrilobular emphysema.  Subpleural radiation scarring in the right lung.  Tiny nodules in the right upper lobe, 3 mm or less, unchanged.  4.3 cm ascending aortic aneurysm, stable 08/16/2023 CXR: no acute airspace disease or acute process  08/16/2023 BNP 473, d dimer negative  08/26/2023 echo: EF 30-35%, GIDD. RV size nl, mildly reduced function. Normal PASP. LA mildly dilated. Mild MR.  12/28/2023 echo: EF 40-45%. RV low function. LA and RA dilated. Moderate MR. Moderate to severe TR. Repaired/replaced AV.  04/03/2024 PFT: FVC 72, FEV1 64, ratio 66, DLCO 23  07/13/2023: OV with Dr. Darlean. Maintained on Breztri . SOB with exertion and low SpO2 since heart surgery 01/2019. Walk test with desaturation to 85% on room air - titrated up to 4 lpm on POC and was able to maintain >90%. Referred for oxygen . Referral to pulmonary rehab.   08/16/2023: [name] with Malachy, NP. Discussed the use of AI scribe software for clinical note  transcription with the patient, who gave verbal consent to proceed. History of Present Illness   Dana Pruitt is an 83 year old female with COPD, chronic respiratory failure, heart failure, atrial fibrillation, and pulmonary hypertension who presents with oxygen  dependency and respiratory concerns. She is accompanied by her friend, [name]. She was seen in January by Dr. Darlean after her PCP, Dr. Clarice, diagnosed her with possible respiratory infection. She was started on supplemental oxygen  at this visit with POC unit 4 lpm with activity and continuous at night. She has received this but she has a lot of questions/concerns.  She is experiencing issues with oxygen  dependency, having recently started on oxygen  therapy. She uses a home concentrator unit, maintaining oxygen  levels in the low nineties on therapy, but levels drop into the seventies and eighties when she removes the oxygen  during activities like showering or cooking. She finds it difficult to function without oxygen  during these activities. Three weeks ago, she was seen for a suspected respiratory infection and was prescribed antibiotics and steroids by her PCP, which she completed. She feels better since completing the medication but still experiences some respiratory symptoms.  She reports a recent increase in mucus production and sinus congestion over the past few days. Mucus is clear to white. Coughing slightly more than her usual. She doesn't necessarily feel any short winded than her last visit or have more chest congestion. She has not had fever, chills, or body aches. She uses  Flonase  nasal spray once a day and takes Mucinex  600 mg twice a day. She's not had any known sick exposures. Denies hemoptysis, leg swelling, orthopnea, weight gain, wheezing. Eating and drinking well.  She has a significant cardiac history, including heart failure, atrial fibrillation, and a cardiomyopathy, along with pulmonary hypertension. She was hospitalized for  double pneumonia approximately ten to twelve years ago, which she thinks is when her respiratory problems started. She also has a history of heavy smoking, resulting in moderate COPD/emphysema. She's currently on Breztri , which she uses daily. She doesn't use her neb treatments very frequently.  She has been attending pulmonary rehabilitation, where she was using 3-4liters of oxygen  during exercise. She has experienced issues with her pulsed oxygen  concentrator not functioning properly. It has been turning off on her. She's also still had some low oxygen  levels on the pulsed therapy, into the 80's.   08/27/2023: Ov with [name] NP Patient presents today for follow up. At her last visit, she had increasing oxygen  requirements and worsening DOE. She had elevated BNP on testing, negative d dimer. CXR without acute process. She had weight gain of 7-8 lb. We treated her with short course of furosemide . She did have improvement in oxygen  requirements and is able to maintain on POC again now at 4 lpm. Her weight is down to 124 lb from 128 lb, indicating improvement. She does feel like her breathing is a little bit better but no drastic change.  She had an echocardiogram yesterday which revealed EF 30-35%, GIDD. PASP nl. She has not seen cardiology yet regarding this but Dr. Court did review this and recommended she be seen for follow up visit. She has not scheduled this yet and had not gotten the results so we reviewed them today. Otherwise symptoms are no worse. She does not have any significant leg swelling, orthopnea, PND, wheezing. No hemoptysis. Eating and drinking well. Her sinus symptoms have improved with the ipratropium nasal spray. She's still using her Breztri  twice a day. Didn't notice any change with last course of prednisone .   02/10/2024: Ov with [name] NP MARGOT ORIORDAN is an 83 year old female with COPD, emphysema, and significant heart disease who presents for follow up. She experiences ongoing  shortness of breath with pretty much any activity, which is unchanged from her last visit. No significant cough. She doesn't feel any worse or better since her last visit. She has systolic heart disease, severe COPD, and valvular disease. No increased oxygen  requirements since her visit in May. She is currently being evaluated for a pancreatic mass, suspicious for neoplasm, and is awaiting a PET scan to determine the next steps. She has not yet seen gastroenterology but has been in contact with Dr. Loretha, who is coordinating with Dr. Wilhelmenia, the GI doctor. She has not yet completed her lung function test, which is scheduled for October. She continues to manage her COPD with oxygen  therapy and reports no current cough or hemoptysis. She has lost about 10 lb over the last 6 months, which she has related to stress and depression with decrease appetite. No swelling in her legs, worsening orthopnea, PND, palpitations, CP. No changes in bowel habits, yellowing of the skin/eyes, abdominal pain. Stable fatigue.  She is using Breztri  and nebulizer for her COPD management but reports issues with the tubing, which is not functioning properly. She is awaiting new tubing. She is understandably very stressed about the pancreatic mass and possibilities there. She is also concerned about the progression  of her heart and lung disease. She feels anxious. No SI/HI.   04/03/2024: Today - follow up Discussed the use of AI scribe software for clinical note transcription with the patient, who gave verbal consent to proceed.  History of Present Illness  VERNITA TAGUE is an 83 year old female with COPD and heart failure who presents for follow-up of her respiratory status.  She had repeat PFT today. Lung function overall stable but there is a reduction in DLCO compared to her last test.   She feels unchanged compared to our last visit. Breathing is at her baseline, she feels. No significant cough or wheezing. No fevers,  hemoptysis, weight loss. Appetite has improved.   She has not yet started Ohtuvayre  but does have it at home. She continues to use Breztri  and albuterol  nebulizers for COPD management. Her COPD symptoms have not worsened significantly.  She underwent evaluation for a pancreatic mass, which did not require surgical intervention. The mass was assessed through scans and other tests, and no further action was needed per her report.     No Known Allergies  Immunization History  Administered Date(s) Administered   Fluad Quad(high Dose 65+) 04/29/2019   INFLUENZA, HIGH DOSE SEASONAL PF 05/02/2014, 05/05/2018, 03/22/2021   Influenza Split 03/22/2012   Influenza, Quadrivalent, Recombinant, Inj, Pf 03/07/2020   Influenza,inj,Quad PF,6+ Mos 05/22/2013   Influenza-Unspecified 05/22/2016   PFIZER(Purple Top)SARS-COV-2 Vaccination 07/11/2019, 07/31/2019   PNEUMOCOCCAL CONJUGATE-20 12/15/2021   Pneumococcal Conjugate-13 06/23/2007   Zoster, Live 06/22/2009    Past Medical History:  Diagnosis Date   Anemia    several times over the years (10/17/2012)   Anxiety    Breast cancer (HCC) 02/2010   right breast   CAD (coronary artery disease)    a. LHC 12/2018: mild non-obstructive CAD   Carcinoma of breast treated with adjuvant hormone therapy (HCC) 02/2010   Femara    Chronic systolic CHF    a. Echo in 2015: LVEF of 35-40%, b. Echo in 12/2021: LVEF of 60-65%   COPD (chronic obstructive pulmonary disease) (HCC)    Depression    GERD (gastroesophageal reflux disease)    diet controlled, no med   Hollenhorst plaque, left eye    apparently resolved at recent ophthalmology visit   Hyperlipidemia    Hypertension    IDC, RightStage II, 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   Moderate aortic insufficiency    Non-ischemic cardiomyopathy (HCC)    Paroxysmal atrial fibrillation (HCC)    Pneumonia    that's why I'm here (10/17/2012)   S/P AVR (aortic valve replacement) 04/26/2019   Stroke (HCC)    12/2021     Tobacco History: Social History   Tobacco Use  Smoking Status Former   Current packs/day: 0.00   Average packs/day: 3.0 packs/day for 32.1 years (96.3 ttl pk-yrs)   Types: Cigarettes   Start date: 05/19/1958   Quit date: 06/30/1990   Years since quitting: 33.7  Smokeless Tobacco Never  Tobacco Comments   Stopped smoking for 2-3 years during second child.    Counseling given: Not Answered Tobacco comments: Stopped smoking for 2-3 years during second child.    Outpatient Medications Prior to Visit  Medication Sig Dispense Refill   albuterol  (PROVENTIL ) (2.5 MG/3ML) 0.083% nebulizer solution USE 1 VIAL VIA NEBULIZER EVERY 6 HOURS AS NEEDED FOR WHEEZING OR SHORTNESS OF BREATH 270 mL 8   albuterol  (VENTOLIN  HFA) 108 (90 Base) MCG/ACT inhaler Inhale 2 puffs into the lungs every 6 (six) hours as needed for  wheezing or shortness of breath (4 times daily). INHALE 2 PUFFS BY MOUTH EVERY 4 HOURS AS NEEDED ONLY IF YOU CAN'T CATCH YOUR BREATH 18 g 3   amiodarone  (PACERONE ) 200 MG tablet Take 1 tablet (200 mg total) by mouth daily. 90 tablet 3   apixaban  (ELIQUIS ) 2.5 MG TABS tablet Take 1 tablet (2.5 mg total) by mouth 2 (two) times daily. 180 tablet 1   atorvastatin  (LIPITOR) 20 MG tablet Take 20 mg by mouth daily.      budesonide -glycopyrrolate -formoterol  (BREZTRI  AEROSPHERE) 160-9-4.8 MCG/ACT AERO inhaler INHALE 2 PUFFS INTO THE LUNGS TWICE DAILY 10.7 g 10   carvedilol  (COREG ) 12.5 MG tablet Take 12.5 mg by mouth 2 (two) times daily with a meal.     Cholecalciferol  (VITAMIN D3) 25 MCG (1000 UT) CAPS Take 1,000 Units by mouth daily.     ezetimibe  (ZETIA ) 10 MG tablet Take 10 mg by mouth daily.     FEROSUL 325 (65 Fe) MG tablet Take 325 mg by mouth every other day.     fluticasone  (FLONASE ) 50 MCG/ACT nasal spray SHAKE LIQUID AND USE 2 SPRAYS IN EACH NOSTRIL DAILY 16 g 11   furosemide  (LASIX ) 20 MG tablet Take 20 mg by mouth daily.     guaiFENesin  (MUCINEX ) 600 MG 12 hr tablet Take 1 tablet  (600 mg total) by mouth 2 (two) times daily. 30 tablet 0   guaiFENesin -dextromethorphan  (ROBITUSSIN DM) 100-10 MG/5ML syrup Take 5 mLs by mouth every 4 (four) hours as needed for cough. 118 mL 0   ipratropium (ATROVENT ) 0.06 % nasal spray Place 2 sprays into both nostrils 4 (four) times daily. 15 mL 12   losartan  (COZAAR ) 25 MG tablet Take 25 mg by mouth daily.     OXYGEN  Inhale 5 L/min into the lungs continuous.     pantoprazole  (PROTONIX ) 40 MG tablet Take 40 mg by mouth daily.     potassium chloride  (KLOR-CON  M) 10 MEQ tablet Take 10 mEq by mouth daily.     sertraline  (ZOLOFT ) 100 MG tablet Take 200 mg by mouth daily.     Ensifentrine  (OHTUVAYRE ) 3 MG/2.5ML SUSP Inhale 2.5 mLs into the lungs 2 (two) times daily.     Facility-Administered Medications Prior to Visit  Medication Dose Route Frequency Provider Last Rate Last Admin   sodium chloride  flush (NS) 0.9 % injection 3 mL  3 mL Intravenous Q12H Rolan Ezra RAMAN, MD         Review of Systems: as above    Physical Exam:  BP 102/60   Pulse 63   Temp 98.1 F (36.7 C) (Oral)   Ht 5' 3 (1.6 m)   Wt 117 lb (53.1 kg)   LMP  (LMP Unknown)   SpO2 92% Comment: on 5L  BMI 20.73 kg/m   GEN: Pleasant, interactive, well-kempt; chronically-ill appearing;non-toxic and in no acute distress HEENT:  Normocephalic and atraumatic. PERRLA. Sclera white. Nasal turbinates pink, moist and patent bilaterally. No rhinorrhea present. Oropharynx pink and moist, without exudate or edema. No lesions, ulcerations, or postnasal drip.  NECK:  Supple w/ fair ROM. No JVD. No lymphadenopathy.   CV: Irregular rhythm, rate controlled, no m/r/g, no peripheral edema. Pulses intact, +2 bilaterally. No cyanosis, pallor or clubbing. PULMONARY:  Unlabored, regular breathing. Diminished bilaterally A&P w/o wheezes/rales/rhonchi. No accessory muscle use.  GI: BS present and normoactive. Soft, non-tender to palpation.  MSK: No erythema, warmth or tenderness. Cap refil  <2 sec all extrem. Muscle wasting  Neuro: A/Ox3. No focal  deficits noted.   Skin: Warm, no lesions or rashe Psych: Normal affect and behavior. Judgement and thought content appropriate.     Lab Results:  CBC    Component Value Date/Time   WBC 6.0 01/20/2024 1234   RBC 3.34 (L) 01/20/2024 1234   HGB 10.3 (L) 01/20/2024 1234   HGB 10.7 (L) 09/09/2023 1659   HGB 12.8 11/27/2014 1016   HCT 31.8 (L) 01/20/2024 1234   HCT 31.8 (L) 09/09/2023 1659   HCT 37.1 11/27/2014 1016   PLT 232 01/20/2024 1234   PLT 144 (L) 09/09/2023 1659   MCV 95.2 01/20/2024 1234   MCV 97 09/09/2023 1659   MCV 92.8 11/27/2014 1016   MCH 30.8 01/20/2024 1234   MCHC 32.4 01/20/2024 1234   RDW 15.6 (H) 01/20/2024 1234   RDW 14.5 09/09/2023 1659   RDW 12.9 11/27/2014 1016   LYMPHSABS 0.8 01/20/2024 1234   LYMPHSABS 1.4 11/27/2014 1016   MONOABS 0.5 01/20/2024 1234   MONOABS 0.4 11/27/2014 1016   EOSABS 0.2 01/20/2024 1234   EOSABS 0.2 11/27/2014 1016   BASOSABS 0.1 01/20/2024 1234   BASOSABS 0.0 11/27/2014 1016    BMET    Component Value Date/Time   NA 141 01/20/2024 1234   NA 136 09/09/2023 1659   NA 138 11/27/2014 1017   K 4.0 01/20/2024 1234   K 4.9 11/27/2014 1017   CL 104 01/20/2024 1234   CL 105 03/31/2012 1410   CO2 32 01/20/2024 1234   CO2 25 11/27/2014 1017   GLUCOSE 113 (H) 01/20/2024 1234   GLUCOSE 108 11/27/2014 1017   GLUCOSE 97 03/31/2012 1410   BUN 27 (H) 01/20/2024 1234   BUN 22 09/09/2023 1659   BUN 18.7 11/27/2014 1017   CREATININE 1.41 (H) 01/20/2024 1234   CREATININE 1.0 11/27/2014 1017   CALCIUM  9.9 01/20/2024 1234   CALCIUM  10.1 11/27/2014 1017   GFRNONAA 37 (L) 01/20/2024 1234   GFRNONAA 49 02/10/2023 1323   GFRAA 48 (L) 03/13/2020 1504    BNP    Component Value Date/Time   BNP 928.4 (H) 10/23/2023 1011     Imaging:  No results found.   Administration History     None          Latest Ref Rng & Units 04/03/2024   10:55 AM 01/05/2019    8:37 AM  01/04/2017   10:59 AM  PFT Results  FVC-Pre L 1.79  P 2.13  2.17   FVC-Predicted Pre % 72  P 79  79   FVC-Post L  2.10  2.09   FVC-Predicted Post %  78  76   Pre FEV1/FVC % % 66  P 57  61   Post FEV1/FCV % %  62  63   FEV1-Pre L 1.18  P 1.22  1.32   FEV1-Predicted Pre % 64  P 60  64   FEV1-Post L  1.30  1.31   DLCO uncorrected ml/min/mmHg 4.35  P 7.27  10.22   DLCO UNC% % 23  P 39  43   DLCO corrected ml/min/mmHg 4.35  P 7.27  10.04   DLCO COR %Predicted % 23  P 39  43   DLVA Predicted % 30  P 52  58   TLC L  4.29  5.23   TLC % Predicted %  86  105   RV % Predicted %  88  136     P Preliminary result    No results found for: NITRICOXIDE  Assessment & Plan:   COPD  GOLD II Moderate obstructive disease with high symptom burden, which is multifactorial related to COPD, CHF, PAF, valvular disease and deconditioning. Repeat PFT with stable lung function; decline in DLCO likely related to systolic HF. We added on inhaled phosphodiesterase inhibitor with Ohtuvayre  at last OV but she has yet to start. Instructed to start therapy and reassess response. Side effect profile reviewed. Continue current regimen. Action plan in place. Graded exercises encouraged. High protein, higher calorie diet.   Patient Instructions  Continue Albuterol  inhaler 2 puffs or 3 mL neb every 6 hours as needed for shortness of breath or wheezing. Notify if symptoms persist despite rescue inhaler/neb use.  Continue Breztri  2 puffs Twice daily. Brush tongue and rinse mouth afterwards Continue flonase  nasal spray 2 sprays each nostril daily Continue guaifenesin  1 tab Twice daily as needed for cough/congestion Continue Ipratropium nasal spray 1-2 sprays each nostril Three times a day for nasal congestion/drainage  Continue oxygen  4-5 lpm for goal >88-90%   Start Ohtuvayre  2.5 mL neb Twice daily. Monitor for any mood changes and notify if this occurs. This is a maintenance inhaler that you will use twice  daily regardless of your symptoms, with your Breztri . This is not an emergency/rescue inhaler   Follow up with cardiology, oncology and GI as scheduled  Keep a log of your blood pressure at home Start using Ensures or Boosts 2-3 times a day to help supplement your diet    Follow up with Dr. Jude in January 2026. If symptoms do not improve or worsen, please contact office for sooner follow up or seek emergency care.   Chronic systolic CHF (congestive heart failure) (HCC) HFrEF. Euvolemic today. Management complicated by hypotension. Consideration for Advanced HF referral if symptoms worsen, per cards. Contributing to DOE. Follow up with cardiology as scheduled  Chronic respiratory failure with hypoxia (HCC) Stable oxygen  requirement on supplemental O2 24/7. Goal >88-90%  PAF (paroxysmal atrial fibrillation) (HCC) Rate controlled. Follow up with cardiology       Advised if symptoms do not improve or worsen, to please contact office for sooner follow up or seek emergency care.   I spent 25 minutes of dedicated to the care of this patient on the date of this encounter to include pre-visit review of records, face-to-face time with the patient discussing conditions above, post visit ordering of testing, clinical documentation with the electronic health record, making appropriate referrals as documented, and communicating necessary findings to members of the patients care team.  Comer LULLA Rouleau, NP 04/03/2024  Pt aware and understands NP's role.

## 2024-04-03 NOTE — Assessment & Plan Note (Signed)
 Moderate obstructive disease with high symptom burden, which is multifactorial related to COPD, CHF, PAF, valvular disease and deconditioning. Repeat PFT with stable lung function; decline in DLCO likely related to systolic HF. We added on inhaled phosphodiesterase inhibitor with Ohtuvayre  at last OV but she has yet to start. Instructed to start therapy and reassess response. Side effect profile reviewed. Continue current regimen. Action plan in place. Graded exercises encouraged. High protein, higher calorie diet.   Patient Instructions  Continue Albuterol  inhaler 2 puffs or 3 mL neb every 6 hours as needed for shortness of breath or wheezing. Notify if symptoms persist despite rescue inhaler/neb use.  Continue Breztri  2 puffs Twice daily. Brush tongue and rinse mouth afterwards Continue flonase  nasal spray 2 sprays each nostril daily Continue guaifenesin  1 tab Twice daily as needed for cough/congestion Continue Ipratropium nasal spray 1-2 sprays each nostril Three times a day for nasal congestion/drainage  Continue oxygen  4-5 lpm for goal >88-90%   Start Ohtuvayre  2.5 mL neb Twice daily. Monitor for any mood changes and notify if this occurs. This is a maintenance inhaler that you will use twice daily regardless of your symptoms, with your Breztri . This is not an emergency/rescue inhaler   Follow up with cardiology, oncology and GI as scheduled  Keep a log of your blood pressure at home Start using Ensures or Boosts 2-3 times a day to help supplement your diet    Follow up with Dr. Jude in January 2026. If symptoms do not improve or worsen, please contact office for sooner follow up or seek emergency care.

## 2024-04-03 NOTE — Patient Instructions (Signed)
 Continue Albuterol  inhaler 2 puffs or 3 mL neb every 6 hours as needed for shortness of breath or wheezing. Notify if symptoms persist despite rescue inhaler/neb use.  Continue Breztri  2 puffs Twice daily. Brush tongue and rinse mouth afterwards Continue flonase  nasal spray 2 sprays each nostril daily Continue guaifenesin  1 tab Twice daily as needed for cough/congestion Continue Ipratropium nasal spray 1-2 sprays each nostril Three times a day for nasal congestion/drainage  Continue oxygen  4-5 lpm for goal >88-90%   Start Ohtuvayre  2.5 mL neb Twice daily. Monitor for any mood changes and notify if this occurs. This is a maintenance inhaler that you will use twice daily regardless of your symptoms, with your Breztri . This is not an emergency/rescue inhaler   Follow up with cardiology, oncology and GI as scheduled  Keep a log of your blood pressure at home Start using Ensures or Boosts 2-3 times a day to help supplement your diet    Follow up with Dr. Jude in January 2026. If symptoms do not improve or worsen, please contact office for sooner follow up or seek emergency care.

## 2024-04-03 NOTE — Assessment & Plan Note (Signed)
 HFrEF. Euvolemic today. Management complicated by hypotension. Consideration for Advanced HF referral if symptoms worsen, per cards. Contributing to DOE. Follow up with cardiology as scheduled

## 2024-04-03 NOTE — Patient Instructions (Signed)
 Spirometry pre/post and diffusion capacity performed today.

## 2024-04-03 NOTE — Assessment & Plan Note (Signed)
 Stable oxygen  requirement on supplemental O2 24/7. Goal >88-90%

## 2024-04-03 NOTE — Progress Notes (Signed)
 Spirometry pre/post and diffusion capacity performed today.

## 2024-04-03 NOTE — Assessment & Plan Note (Signed)
Rate controlled.  Follow-up with cardiology. ?

## 2024-04-12 ENCOUNTER — Other Ambulatory Visit: Payer: Self-pay | Admitting: Cardiovascular Disease

## 2024-04-12 DIAGNOSIS — I480 Paroxysmal atrial fibrillation: Secondary | ICD-10-CM

## 2024-04-12 NOTE — Telephone Encounter (Signed)
 Eliquis  2.5mg  refill request received. Patient is 83 years old, weight-53.1kg, Crea-1.41 on 01/20/24, Diagnosis-Afib, and last seen by Aline Door on 01/18/24. Dose is appropriate based on dosing criteria. Will send in refill to requested pharmacy.

## 2024-04-15 NOTE — Progress Notes (Unsigned)
 Cardiology Office Note:    Date:  04/19/2024   ID:  Dana Pruitt, DOB 1940/11/26, MRN [id number]  PCP:  Clarice Nottingham, MD  Cardiologist:  Dorn Lesches, MD     Referring MD: Clarice Nottingham, MD   Chief Complaint: follow-up of CHF and atrial fibrillation  History of Present Illness:    Dana Pruitt is a 83 y.o. female with a history of mild irregularities but no significant CAD noted on recent cardiac catheterization on cardiac catheterization in 08/2023, severe aortic insufficiency s/p AVR in 04/2019, chronic systolic CHF/non-ischemic cardiomyopathy with EF of 40-45% on Echo in 12/2023, paroxysmal atrial fibrillation on Eliquis , mild mitral regurgitation, ascending thoracic aortic aneurysm, stroke in 12/2021 and 12/2023, COPD with chronic hypoxic respiratory failure on 5L of O2, hypertension, hyperlipidemia, CKD stage III, GERD, anxiety/depression,  breast cancer s/p chemotherapy/radiation and lumpectomy in 2013, and pancreatic mass who is followed by Dr. Lesches who presents today for routine follow-up.  Patient was referred to Dr. Lesches in 2015 after Echo showed LVEF of 35-40% with moderate aortic insufficiency. She was started on beta-blocker for her cardiomyopathy and EF eventually normalized to to 60-65% on repeat Echo in 06/2016. Aortic disease progressed and Echo in 09/2018 showed LVEF of 40-45 with diffuse hypokinesis, grade 1 diastolic dysfunction, and severe AI. She was admitted in 12/2018 with chest pain and shortness of breath. She underwent R/LHC at that time which showed mild non-obstructive CAD, wide aortic pulse pressure suggestive of significant AI, preserved cardiac output, and low filling pressures. Patient ultimately underwent AVR with 21mm Inspiris Edwards valve by Dr. Fleeta Ochoa on 01/31/2019. She developed post-op atrial fibrillation but converted back to sinus rhythm after initiation of Amiodarone . She was discharge on Amiodarone  and Coumadin  both of which were able to be  discontinued later. However, she had recurrent episodes of atrial fibrillation and was started on Eliquis .   Coronary CTA in 12/2021 for further evaluation of dyspnea on exertion and an episode of left arm pain showed a coronary calcium  score of 709 (87th percentile for age and sex) with mild (25-49%) plaque in the RCA, LAD, and LCX (FFR negative) as well as a 4.2 cm ascending aortic aneurysm (stable from prior imaging).   Echo in 07/2023 for further evaluation of worsening shortness of of breath showed LVEF had dropped to LVEF of 30-35% with global hypokinesis but prominent septal-lateral dyssynchrony due to LBBB. She was subsequently admitted in 08/2023 for chest pain that work her up from sleep. Troponin was negative. R/ LHC showed patent coronary arteries with only mild irregularities and essentially normal intracardiac hemodynamics. Hospitalization was complicated by atrial fibrillation with RVR  and syncope. Syncopal episode occurred in the setting of rapid atrial fibrillation and hypotension. She was started on IV Amiodarone  and then discharge don PO Amiodarone .   She has had multiple hospitalization since then for non-cardiac issues (acute on chronic hypoxic respiratory failure and sepsis secondary to influenza and bacterial pneumonia as well as COPD exacerbation in 08/2023, acute on chronic hypoxic respiratory failure secondary to COPD exacerbation in 10/2023,  and CVA in 12/2023).  Last Echo in 12/2023 during admission for stroke showed  LVEF of 40-45% with no regional wall motion abnormalities, low normal RV function, moderate MR, moderate to severe TR, mild dilatation of the aortic root measuring 41mm, and a severely dilated pulmonary artery. Carotid dopplers at that time were unremarkable. Neurology recommended a pan CT to rule out malignancy. This was done as an outpatient and showed 2.3cm  hypoattenuating mass either arising from or abutting the pancreatic head/ uncinate process.   She was last seen  by me at the end of 12/2023 at which time she reported chronic but stable dyspnea due to her COPD as well as occasional lightheadedness/ dizziness. I offered a repeat cardiac monitor but patient declined. She was scheduled to see Oncology later that week and wanted to see what they said first.   Oncology subsequently ordered abdominal MRI and PET scan and there was no findings of active malignancy. There was no hypermetabolic activity associated with the cystic pancreatic head lesion (although this does not exclude intraductal papillary mucinous neoplasm. Oncology recommended following up with GI.  She saw GI on 04/18/2024 she was felt to be very high risk for an endoscopy ultrasound and no further surveillance imaging was planned given patient was not sure she would even want treatment for this.  Patient presents today for follow-up.  Overall, she is stable from a cardiac standpoint.  She has chronic dyspnea on exertion due to her COPD but this is stable.  No orthopnea or PND.  She reports a little dizziness with standing but states this is not new and is stable.  No significant dizziness and no recurrent syncope.  ROS: No chest pain, orthopnea, PND, lower extremity edema, palpitations, syncope.    EKGs/Labs/Other Studies Reviewed:    The following studies were reviewed:  Right/ Left Cardiac Catheterization 08/31/2023: 1.  Patent coronary arteries with mild irregularities but no stenotic lesions.  Mild ectasia in the proximal RCA and mid circumflex. 2.  Essentially normal intracardiac hemodynamics with a right atrial mean pressure of 4 mmHg, PA pressure of 33/14 mean 21 mmHg, pulmonary wedge pressure of 5 mmHg, and preserved cardiac output of 3.7 L/min   Plan: med Rx per inpatient cardiology team. OK to resume apixaban  tomorrow am (orders written).    Diagnostic Dominance: Right  _______________   Limited Echocardiogram 10/25/2023: Impressions:  1. Left ventricular ejection fraction, by  estimation, is 35 to 40%. The  left ventricle has moderately decreased function. The left ventricle  demonstrates global hypokinesis.   2. The mitral valve is normal in structure. Mild mitral valve  regurgitation.   3. Tricuspid valve regurgitation is mild to moderate.   4. The aortic valve has been repaired/replaced. Aortic valve  regurgitation is not visualized. Aortic valve sclerosis is present, with  no evidence of aortic valve stenosis. There is a 21 mm a bioprosthetic  valve present in the aortic position. Procedure  Date: 04/26/19. Aortic valve mean gradient measures 10.0 mmHg.   5. Aortic dilatation noted. There is mild dilatation of the ascending  aorta, measuring 44 mm.   6. There is normal pulmonary artery systolic pressure.   7. The inferior vena cava is normal in size with greater than 50%  respiratory variability, suggesting right atrial pressure of 3 mmHg.  _______________   Carotid Dopplers 12/28/2023: Summary: - Right Carotid: Velocities in the right ICA are consistent with a 1-39%  stenosis.  - Left Carotid: The extracranial vessels were near-normal with only minimal  wall thickening or plaque.  - Vertebrals:  Bilateral vertebral arteries demonstrate antegrade flow.  - Subclavians: Normal flow hemodynamics were seen in bilateral subclavian arteries.  _______________   Complete Echocardiogram 12/28/2023: Impressions: 1. Left ventricular ejection fraction, by estimation, is 40 to 45%. The  left ventricle has mildly decreased function. The left ventricle has no  regional wall motion abnormalities. Left ventricular diastolic function  could not be  evaluated.   2. Right ventricular systolic function is low normal. The right  ventricular size is normal.   3. Left atrial size was mildly dilated.   4. Right atrial size was mildly dilated.   5. The mitral valve is normal in structure. Moderate mitral valve  regurgitation. No evidence of mitral stenosis.   6. Tricuspid  valve regurgitation is moderate to severe.   7. The aortic valve has been repaired/replaced. Aortic valve  regurgitation is not visualized. Aortic valve mean gradient measures 9.0  mmHg. Aortic valve Vmax measures 2.10 m/s. Aortic valve acceleration time  measures 74 msec.   8. Aortic dilatation noted. There is mild dilatation of the aortic root,  measuring 41 mm.   9. Severely dilated pulmonary artery.   Comparison(s): Prior images reviewed side by side. Mitral regurgitation  has increased. Prior study used echo-contrast for LVEF assessment;  difficult comparison.   EKG:  EKG not ordered today.   Recent Labs: 08/16/2023: Pro B Natriuretic peptide (BNP) 473.0 09/02/2023: TSH 2.008 09/14/2023: Magnesium  1.7 10/23/2023: B Natriuretic Peptide 928.4 01/20/2024: ALT 21; BUN 27; Creatinine 1.41; Hemoglobin 10.3; Platelets 232; Potassium 4.0; Sodium 141  Recent Lipid Panel    Component Value Date/Time   CHOL 151 12/28/2023 0457   CHOL 197 02/16/2014 0826   TRIG 101 12/28/2023 0457   HDL 38 (L) 12/28/2023 0457   HDL 52 02/16/2014 0826   CHOLHDL 4.0 12/28/2023 0457   VLDL 20 12/28/2023 0457   LDLCALC 93 12/28/2023 0457   LDLCALC 127 (H) 02/16/2014 0826    Physical Exam:    Vital Signs: BP 99/62 (BP Location: Left Arm, Patient Position: Sitting, Cuff Size: Normal)   Pulse 68   Resp 16   Ht 5' 3 (1.6 m)   Wt 117 lb (53.1 kg)   LMP  (LMP Unknown)   SpO2 92%   BMI 20.73 kg/m     Wt Readings from Last 3 Encounters:  04/19/24 117 lb (53.1 kg)  04/18/24 117 lb (53.1 kg)  04/03/24 117 lb (53.1 kg)     General: 83 y.o. female in no acute distress. On 5L of O2 via nasal cannula. HEENT: Normocephalic and atraumatic. Sclera clear.  Neck: Supple. No JVD. Heart: RRR. Distinct S1 and S2. No murmurs, gallops, or rubs.  Lungs: No increased work of breathing. Decreased breath sounds in bilateral bases but no significant wheezes, rhonchi, or rales appreciated.  Extremities: No lower  extremity edema.   Skin: Warm and dry. Neuro: No focal deficits. Psych: Normal affect. Responds appropriately.  Assessment:    1. Mild Non-obstructive CAD   2. Chronic HFrEF (heart failure with reduced ejection fraction) (HCC)   3. Non-ischemic cardiomyopathy (HCC)   4. Paroxysmal atrial fibrillation (HCC)   5. History of syncope   6. Severe aortic insufficiency s/p AVR   7. Moderate mitral regurgitation   8. Tricuspid valve insufficiency, unspecified etiology   9. Aneurysm of ascending aorta without rupture   10. Hyperlipidemia, unspecified hyperlipidemia type   11. Stage 3a chronic kidney disease (HCC)   12. Chronic obstructive pulmonary disease, unspecified COPD type (HCC)   13. Chronic hypoxic respiratory failure (HCC)     Plan:    Mild Non-Obstructive CAD Patient has a history intermittent chest pain. She has had multiple ischemic work-ups with cardiac catheterizations and coronary CTAs in the past which have been unremarkable. Most recent LHC  in 08/2023  during and admission for chest pain showed mild irregularities but no significant CAD.  -  No chest pain.  - No aspirin  due to need for full anticoagulation.  - Continue statin/ Zetia .    Chronic HFrEF Non-Ischemic Cardiomyopathy Recent Echo in 12/2023 showed LVEF of 40-45% with no regional wall motion abnormalities, low normal RV function, - She has chronic dyspnea on exertion due to COPD but euvolemic on exam.    - GDMT limited by hypotension. - Continue Lasix  20mg  daily and KCl 10 mEq daily. - Continue Losartan  25mg  daily.  - Continue Coreg  12.5mg  twice daily. - She is prone to UTIs so will hold off on Farxiga. - BP is soft today at 99/62 so I do not have any room to up-titrate GDMT. Switching from Coreg  to Toprol -XL could potentially help provide more BP room to add low dose Spironolactone. Toprol -XL may also be better for her COPD. She states she was on this before and does not remember why it was stopped. I asked  patient to keep a BP/ HR log for me for 2 weeks for me to see if we have any room at all to adjust anything. In the meantime, I will dig further in her chart to see if I can determine why Toprol -XL was stopped.  - Unclear cause of her cardiomyopathy. Possibly secondary to dyssynchrony from LBBB. Will review chart with EP team to see if she would benefit from CRT. I don't think this will necessarily improve her symptoms as dyspnea is more due to COPD.    Paroxysmal Atrial Fibrillation Maintaining sinus rhythm on exam.  - Continue Amiodarone  200mg  daily.  - Continue  Coreg  12.5mg  twice daily. - Continue Eliquis  2.5 mg twice daily (reduced dose due to age and weight).   History of Syncope She had a syncopal episode during admission in 08/2023 which occurred in the setting of rapid atrial fibrillation and hypotension. No significant arrhythmias were noted on telemetry at the time.  - No recurrent syncope.    Severe Aortic Insufficiency s/p AVR Moderate Mitral Regurgitation Moderate to Severe Tricuspid Regurgitation  History of AVR in 2020. Echo in 12/2023 showed stable bioprosthetic aortic vave with mean gradient of 9.0 mmHg as well as moderate MR and moderate to severe TR. MR and TR had both increased since 10/2023. - Continue SBE prophylaxis.  - Continue routine monitoring with serial Echos.    Ascending Thoracic Aortic Aneurysm  Recent PET scan in 01/2024 showed a stable ascending thoracic aortic aneurysm measuring 4.3cm.  - Can repeat chest CTA in 01/2025.    Hyperlipidemia Lipid panel in 08/2023: Total Cholesterol 151, Triglycerides 101, HDL 38, LDL 93. LDL goal <55 given multiple strokes and CAD.  - Currently on Lipitor 20mg  daily and Zetia  10mg  daily. Continue. - LDL is not at goal. Discussed increasing Lipitor and she would like to do this. Will increase Lipitor to 40mg  daily.  - Will repeat lipid panel and CMET in 3 months.    CKD Stage IIIa  Recent baseline creatinine is around 1.2 to  1.4.   COPD  Chronic Hypoxic Respiratory Failure On 5L of O2 at home.  - Stable.  Disposition: Follow up in 4 months.   Signed, Aline FORBES Door, PA-C  04/19/2024 12:49 PM    [facility]

## 2024-04-18 ENCOUNTER — Ambulatory Visit: Admitting: Gastroenterology

## 2024-04-18 ENCOUNTER — Encounter: Payer: Self-pay | Admitting: Gastroenterology

## 2024-04-18 VITALS — BP 112/70 | HR 66 | Ht 63.0 in | Wt 117.0 lb

## 2024-04-18 DIAGNOSIS — K8689 Other specified diseases of pancreas: Secondary | ICD-10-CM

## 2024-04-18 DIAGNOSIS — K862 Cyst of pancreas: Secondary | ICD-10-CM | POA: Insufficient documentation

## 2024-04-18 DIAGNOSIS — K869 Disease of pancreas, unspecified: Secondary | ICD-10-CM | POA: Insufficient documentation

## 2024-04-18 DIAGNOSIS — R935 Abnormal findings on diagnostic imaging of other abdominal regions, including retroperitoneum: Secondary | ICD-10-CM | POA: Diagnosis not present

## 2024-04-18 NOTE — Patient Instructions (Addendum)
 After discussion with Dr Wilhelmenia today, you have agreed on no further pancreatic surveillance. If something changes, then have your referring provider to reach out to our clinic. Dr. Wilhelmenia may consider MRI/MRCP for screening.   _______________________________________________________  If your blood pressure at your visit was 140/90 or greater, please contact your primary care physician to follow up on this.  _______________________________________________________  If you are age 68 or older, your body mass index should be between 23-30. Your Body mass index is 20.73 kg/m. If this is out of the aforementioned range listed, please consider follow up with your Primary Care Provider.  If you are age 47 or younger, your body mass index should be between 19-25. Your Body mass index is 20.73 kg/m. If this is out of the aformentioned range listed, please consider follow up with your Primary Care Provider.   ________________________________________________________  The [facility] providers would like to encourage you to use MYCHART to communicate with providers for non-urgent requests or questions.  Due to long hold times on the telephone, sending your provider a message by [facility] may be a faster and more efficient way to get a response.  Please allow 48 business hours for a response.  Please remember that this is for non-urgent requests.  _______________________________________________________  [facility] is using a team-based approach to care.  Your team is made up of your doctor and two to three APPS. Our APPS (Nurse Practitioners and Physician Assistants) work with your physician to ensure care continuity for you. They are fully qualified to address your health concerns and develop a treatment plan. They communicate directly with your gastroenterologist to care for you. Seeing the Advanced Practice Practitioners on your physician's team can help you by facilitating care more  promptly, often allowing for earlier appointments, access to diagnostic testing, procedures, and other specialty referrals.   Thank you for choosing me and [facility].  Dr. Wilhelmenia

## 2024-04-18 NOTE — Progress Notes (Signed)
 GASTROENTEROLOGY OUTPATIENT CLINIC VISIT   Primary Care Provider Clarice Nottingham, MD [address] [place] [address] [place] [address] [phone]  Referring Provider Loretha Ash, MD [address] [place],  [place] [address] [phone]  Patient Profile: Dana Pruitt is a 83 y.o. female with a pmh significant for CAD, CHFrEF, Advanced COPD (on 5L O2), MDD, Anxiety, Afib (on Eliquis ), prior CVAs, prior Breast Cancer, Pancreatic Cystic Lesion.  The patient presents to the [facility] for an evaluation and management of problem(s) noted below:  Problem List 1. Pancreatic cyst   2. Pancreatic lesion   3. Abnormal MRI of abdomen    Discussed the use of AI scribe software for clinical note transcription with the patient, who gave verbal consent to proceed.  History of Present Illness JAICEY Pruitt is an 83 year old female who presents for evaluation of a pancreatic cyst.  During July she was diagnosed with another CVA.  She followed up with her PCP who performed a CT-CAP that showed a 2.3 lesion on the pancreas.  MRI/MRCP was completed with results as below showing the lesion.  CA19-9 was normal.  Oncology ordered a subsequent PET scan showed no increased activity in the pancreas or the abdomen/pelvis.  She has never had any pancreatic disease per her report (no prior pancreatitis or gallbladder issues).  No GI bleeding has been noted.  She has no family history of GI malignancies that she is aware of.  She has no abdominal pain/discomfort or nausea or vomiting.  She remains on Eliquis .  She has advanced COPD and is on oxygen  therapy, requiring 4-5 liters regularly.  She is concerned about her life expectancy and the risks associated with potential procedures due to her current health status.   GI Review of Systems Positive as above Negative for dysphagia, odynophagia, nausea, vomiting, abdominal pain, changes in bowel habits  Review of Systems General: Denies  fevers/chills Cardiovascular: Denies chest pain Pulmonary: SOB at baseline Gastroenterological: See HPI Genitourinary: Denies darkened urine Hematological: Positive for easy bruising/bleeding Dermatological: Denies jaundice Psychological: Mood is stable  Medications Current Outpatient Medications  Medication Sig Dispense Refill   albuterol  (PROVENTIL ) (2.5 MG/3ML) 0.083% nebulizer solution USE 1 VIAL VIA NEBULIZER EVERY 6 HOURS AS NEEDED FOR WHEEZING OR SHORTNESS OF BREATH 270 mL 8   albuterol  (VENTOLIN  HFA) 108 (90 Base) MCG/ACT inhaler Inhale 2 puffs into the lungs every 6 (six) hours as needed for wheezing or shortness of breath (4 times daily). INHALE 2 PUFFS BY MOUTH EVERY 4 HOURS AS NEEDED ONLY IF YOU CAN'T CATCH YOUR BREATH 18 g 3   amiodarone  (PACERONE ) 200 MG tablet Take 1 tablet (200 mg total) by mouth daily. 90 tablet 3   atorvastatin  (LIPITOR) 20 MG tablet Take 20 mg by mouth daily.      budesonide -glycopyrrolate -formoterol  (BREZTRI  AEROSPHERE) 160-9-4.8 MCG/ACT AERO inhaler INHALE 2 PUFFS INTO THE LUNGS TWICE DAILY 10.7 g 10   carvedilol  (COREG ) 12.5 MG tablet Take 12.5 mg by mouth 2 (two) times daily with a meal.     Cholecalciferol  (VITAMIN D3) 25 MCG (1000 UT) CAPS Take 1,000 Units by mouth daily.     ELIQUIS  2.5 MG TABS tablet TAKE 1 TABLET BY MOUTH TWICE A DAY 180 tablet 1   Ensifentrine  (OHTUVAYRE ) 3 MG/2.5ML SUSP Inhale 2.5 mLs into the lungs 2 (two) times daily.     ezetimibe  (ZETIA ) 10 MG tablet Take 10 mg by mouth daily.     FEROSUL 325 (65 Fe) MG tablet Take  325 mg by mouth every other day.     fluticasone  (FLONASE ) 50 MCG/ACT nasal spray SHAKE LIQUID AND USE 2 SPRAYS IN EACH NOSTRIL DAILY 16 g 11   furosemide  (LASIX ) 20 MG tablet Take 20 mg by mouth daily.     guaiFENesin  (MUCINEX ) 600 MG 12 hr tablet Take 1 tablet (600 mg total) by mouth 2 (two) times daily. 30 tablet 0   guaiFENesin -dextromethorphan  (ROBITUSSIN DM) 100-10 MG/5ML syrup Take 5 mLs by mouth every 4  (four) hours as needed for cough. 118 mL 0   ipratropium (ATROVENT ) 0.06 % nasal spray Place 2 sprays into both nostrils 4 (four) times daily. 15 mL 12   losartan  (COZAAR ) 25 MG tablet Take 25 mg by mouth daily.     OXYGEN  Inhale 5 L/min into the lungs continuous.     pantoprazole  (PROTONIX ) 40 MG tablet Take 40 mg by mouth daily.     potassium chloride  (KLOR-CON  M) 10 MEQ tablet Take 10 mEq by mouth daily.     sertraline  (ZOLOFT ) 100 MG tablet Take 200 mg by mouth daily.     No current facility-administered medications for this visit.   Facility-Administered Medications Ordered in Other Visits  Medication Dose Route Frequency Provider Last Rate Last Admin   sodium chloride  flush (NS) 0.9 % injection 3 mL  3 mL Intravenous Q12H Rolan Ezra RAMAN, MD        Allergies No Known Allergies  Histories Past Medical History:  Diagnosis Date   Anemia    several times over the years (10/17/2012)   Anxiety    Breast cancer (HCC) 02/2010   right breast   CAD (coronary artery disease)    a. LHC 12/2018: mild non-obstructive CAD   Carcinoma of breast treated with adjuvant hormone therapy (HCC) 02/2010   Femara    Chronic systolic CHF    a. Echo in 2015: LVEF of 35-40%, b. 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 SN: [id number];  Surgeon: Fleeta Ochoa, Maude, MD;  Location: [facility] OR;  Service: Open Heart Surgery;  Laterality: N/A;   APPENDECTOMY  1974   BREAST BIOPSY Right 02/27/10   Needle core Biopsy; Invasive Mammary; ER/PR Positive, Her-2 Neu negative, Ki-67 22%   BREAST LUMPECTOMY WITH SENTINEL LYMPH NODE BIOPSY Right 07/03/11   Invasive Ductal Carcinoma;0/3 nodes negative,; ER,PR Positive, Her-2 Neg; Ki-67 22%   CESAREAN SECTION  1970; 1974   COLONOSCOPY     DILATION AND CURETTAGE OF UTERUS  1970's   RIGHT HEART CATH AND CORONARY ANGIOGRAPHY N/A 08/31/2023   Procedure: RIGHT HEART CATH AND CORONARY ANGIOGRAPHY;  Surgeon: Wonda Sharper, MD;  [facility]AS[facility] CV LAB;  Service: Cardiovascular;  Laterality: N/A;   RIGHT/LEFT HEART CATH AND CORONARY  ANGIOGRAPHY N/A 01/02/2019   Procedure: RIGHT/LEFT HEART CATH AND CORONARY ANGIOGRAPHY;  Surgeon: Rolan Ezra RAMAN, MD;  Location: [facility] INVASIVE CV LAB;  Service: Cardiovascular;  Laterality: N/A;   TEE WITHOUT CARDIOVERSION N/A 01/31/2019   Procedure: TRANSESOPHAGEAL ECHOCARDIOGRAM (TEE);  Surgeon: Fleeta Ochoa, Maude, MD;  Location: [facility] OR;  Service: Open Heart Surgery;  Laterality: N/A;   TUBAL LIGATION  1974   WISDOM TOOTH EXTRACTION     Social History   Socioeconomic History   Marital status: Married    Spouse name: Not on file   Number of children: 2   Years of education: college   Highest education level: Bachelor's degree (e.g., BA, AB, BS)  Occupational History    Employer: RETIRED  Tobacco Use   Smoking status: Former    Current packs/day: 0.00    Average packs/day: 3.0 packs/day for 32.1 years (96.3 ttl pk-yrs)    Types: Cigarettes    Start date: 05/19/1958    Quit date: 06/30/1990    Years since quitting: 33.8   Smokeless tobacco: Never   Tobacco comments:    Stopped smoking for 2-3 years during second child.   Vaping Use   Vaping status: Never Used  Substance and Sexual Activity   Alcohol use: Not Currently    Drug use: No   Sexual activity: Yes    Birth control/protection: Post-menopausal    Comment: Menarche age 78, G41,P2, Menopause in 23s, No HRT Married  Other Topics Concern   Not on file  Social History Narrative   [facility] (12/29/16):   Originally from [place], [place]. Moved to [place] at 83 y.o. Does have a dog. Hasn't worked outside the home. Remote parakeet exposure from her children. No mold or hot tub exposure. Enjoys doing yard work & walking.    Social Drivers of Corporate Investment Banker Strain: Not on file  Food Insecurity: No Food Insecurity (12/27/2023)   Hunger Vital Sign    Worried About Running Out of Food in the Last Year: Never true    Ran Out of Food in the Last Year: Never true  Transportation Needs: No Transportation Needs (12/27/2023)   PRAPARE - Administrator, Civil Service (Medical): No    Lack of Transportation (Non-Medical): No  Physical Activity: Not on file  Stress: Not on file  Social Connections: Moderately Integrated (12/27/2023)   Social Connection and Isolation Panel    Frequency of Communication with Friends and Family: More than three times a week    Frequency of Social Gatherings with Friends and Family: More than three times a week    Attends Religious Services: Never    Database Administrator or Organizations: Yes    Attends Engineer, Structural: More than 4 times per year    Marital Status: Married  Catering Manager Violence: Not At Risk (12/27/2023)   Humiliation, Afraid, Rape, and Kick questionnaire    Fear of Current or Ex-Partner: No    Emotionally Abused: No    Physically Abused: No    Sexually Abused: No   Family History  Problem Relation Age of Onset   Heart disease Mother    Cancer Father    Heart disease Maternal Grandmother    Heart disease Maternal Grandfather    Cancer Paternal Grandmother    Lung disease Neg Hx    Colon cancer Neg Hx    Esophageal cancer Neg Hx    Inflammatory bowel disease Neg Hx     Liver  disease Neg Hx    Pancreatic cancer Neg Hx    Rectal cancer Neg Hx    Stomach cancer Neg Hx    I have reviewed her medical, social, and family history in detail and updated the electronic medical record as necessary.    PHYSICAL EXAMINATION  BP 112/70   Pulse 66   Ht 5' 3 (1.6 m)   Wt 117 lb (53.1 kg)   LMP  (LMP Unknown)   BMI 20.73 kg/m  Wt Readings from Last 3 Encounters:  04/18/24 117 lb (53.1 kg)  04/03/24 117 lb (53.1 kg)  02/10/24 116 lb (52.6 kg)  GEN: NAD, appears older than stated age, chronically ill in appearance but non-toxic today PSYCH: Cooperative, without pressured speech EYE: Conjunctivae pink, sclerae anicteric ENT: [place] in place CV: Nontachycardic RESP: No audible wheezing GI: NABS, soft, NT/ND, without rebound or guarding MSK/EXT: No significant lower extremity edema SKIN: No jaundice NEURO:  Alert & Oriented x 3, no focal deficits   REVIEW OF DATA  I reviewed the following data at the time of this encounter:  GI Procedures and Studies  No relevant studies to review  Laboratory Studies  Reviewed those in EPIC CA19-9 = WNL  Imaging Studies  7/25 CT-CAP IMPRESSION:  1. No acute findings.  2. 2.3 cm hypoattenuating mass either arising from or abutting the pancreatic head/uncinate process. This could represent pancreatic neoplasm or adjacent mass or adenopathy. Would recommend further characterization with either MRI or contrast-enhanced CT.  3. Emphysema.  4. Ascending aortic aneurysm. The main pulmonary artery is dilated suggesting pulmonary hypertension.  5. Cholelithiasis.  6. Colonic diverticulosis.  7. Additional comments are as above.   8/25 MRI/MRCP IMPRESSION: Motion degraded examination limits sensitivity and specificity. Within this context:   1. Multi septated cystic lesion in the head of the pancreas measures 2.2 x 1.8 cm without suspicious postcontrast enhancement. Compatible with a cystic pancreatic neoplasm possibly an  IPMN vs versus mucinous or serous neoplasm. Given the pancreatic ductal dilation would consider further evaluation with EUS/FNA versus follow-up pre and postcontrast MRCP in 6 months. 2. Multiple additional small cystic foci in the head, body and tail of the pancreas measuring up to 3 mm, likely reflecting side branch IPMNs. Attention on follow-up imaging suggested. 3. Cholelithiasis. 4. Colonic diverticulosis  8/25 PETCT IMPRESSION: 1. No hypermetabolic activity associated with the cystic pancreatic head lesion. This does not exclude intraductal papillary mucinous neoplasm. 2. No findings of active malignancy. 3. Aortic Atherosclerosis (ICD10-I70.0) and Emphysema (ICD10-J43.9). Systemic atherosclerosis. 4. Chronic paranasal sinusitis. 5. Ascending thoracic aortic aneurysm 4.3 cm in diameter. Recommend annual imaging followup by CTA or MRA. This recommendation follows 2010 ACCF/AHA/AATS/ACR/ASA/SCA/SCAI/SIR/STS/SVM Guidelines for the Diagnosis and Management of Patients with Thoracic Aortic Disease. Circulation. 2010; 121: Z733-z630. Aortic aneurysm NOS (ICD10-I71.9) 6. Suspected cholelithiasis.   ASSESSMENT/PLAN  Ms. Lorek is a 83 y.o. female.  The patient is seen today for evaluation and management of:  1. Pancreatic cyst   2. Pancreatic lesion   3. Abnormal MRI of abdomen    The patient is hemodynamically stable at this time.  From a GI perspective she has no overt GI symptoms.     Pancreatic cyst/lesion Identified incidentally by cross-sectional imaging without any overt GI symptomatology.  Confirmed by imaging as a more a cystic lesion without hyperenhancement or increased activity on PET scan.  The cyst is less than 3 cm, with no solid components.  No prior symptoms suggestive of pancreatitis or other complications.  She  is incredibly high risk for consideration of Endoscopic Ultrasound.  Risks of EUS discussed including  perforation/bleeding/infection/aspiration/medication effects as well as 2% chance of Pancreatitis if sampling of the pancreas is performed.  Her recent recurrent CVA, her CHF, her advanced COPD poses her higher-than-normal anesthesia risk.  Given her high risk and lack of concerning features on imaging we discussed 3 options - 1) Stop all surveillance vs 2) Repeat imaging with MRI/MRCP in 4-21-month interval (from last MRCP) and then decided vs 3) Move forward with EUS and possible sampling.  She understands that if we stop surveillance, we could miss an underlying pancreatic malignancy (less likely adenocarcinoma but possible cystic NET).  She is not sure she would want any other therapies if she was found to have a pancreatic malignancy and not sure she can tolerate these.  She has decided will all of this information to stop any further monitoring of this lesion.  We can certainly reconsider things in the future if imaging is desired.  I will support her with this decision due to her overall clinical status, but happy to re-evaluate in the future if things change.   - No further surveillance or imaging at this time.  She understands risks of potential progressive nature - If clinical status changes, will consider repeat MRI to assess for interval changes.    No orders of the defined types were placed in this encounter.   New Prescriptions   No medications on file   Modified Medications   No medications on file    Planned Follow Up No follow-ups on file.   Total Time in Face-to-Face and in Coordination of Care for patient including independent/personal interpretation/review of prior testing, visualization and discussion of the actual imaging, medical history, examination, medication adjustment, communicating results with the patient directly, and documentation within the EHR is 45 minutes.   Aloha Finner, MD [facility] Office # [phone]

## 2024-04-19 ENCOUNTER — Encounter: Payer: Self-pay | Admitting: Student

## 2024-04-19 ENCOUNTER — Ambulatory Visit: Attending: Student | Admitting: Student

## 2024-04-19 VITALS — BP 99/62 | HR 68 | Resp 16 | Ht 63.0 in | Wt 117.0 lb

## 2024-04-19 DIAGNOSIS — I428 Other cardiomyopathies: Secondary | ICD-10-CM | POA: Diagnosis not present

## 2024-04-19 DIAGNOSIS — I2510 Atherosclerotic heart disease of native coronary artery without angina pectoris: Secondary | ICD-10-CM | POA: Diagnosis not present

## 2024-04-19 DIAGNOSIS — E785 Hyperlipidemia, unspecified: Secondary | ICD-10-CM

## 2024-04-19 DIAGNOSIS — Z87898 Personal history of other specified conditions: Secondary | ICD-10-CM

## 2024-04-19 DIAGNOSIS — I071 Rheumatic tricuspid insufficiency: Secondary | ICD-10-CM

## 2024-04-19 DIAGNOSIS — I480 Paroxysmal atrial fibrillation: Secondary | ICD-10-CM

## 2024-04-19 DIAGNOSIS — J9611 Chronic respiratory failure with hypoxia: Secondary | ICD-10-CM

## 2024-04-19 DIAGNOSIS — I351 Nonrheumatic aortic (valve) insufficiency: Secondary | ICD-10-CM

## 2024-04-19 DIAGNOSIS — N1831 Chronic kidney disease, stage 3a: Secondary | ICD-10-CM

## 2024-04-19 DIAGNOSIS — I5022 Chronic systolic (congestive) heart failure: Secondary | ICD-10-CM

## 2024-04-19 DIAGNOSIS — J449 Chronic obstructive pulmonary disease, unspecified: Secondary | ICD-10-CM

## 2024-04-19 DIAGNOSIS — I7121 Aneurysm of the ascending aorta, without rupture: Secondary | ICD-10-CM

## 2024-04-19 DIAGNOSIS — I340 Nonrheumatic mitral (valve) insufficiency: Secondary | ICD-10-CM

## 2024-04-19 MED ORDER — ATORVASTATIN CALCIUM 20 MG PO TABS: 40.0000 mg | ORAL_TABLET | Freq: Every day | ORAL | 3 refills | Status: AC

## 2024-04-19 NOTE — Patient Instructions (Addendum)
 Thank you for choosing [facility]!     Medication Instructions:  Increase the Lipitor to 40mg . Take one table daily.  MONITOR YOUR BLOOD PRESSURE AND HEART RATE DAILY.  Record readings on blood pressure log. You may upload the log to your mychart.  *If you need a refill on your cardiac medications before your next appointment, please call your pharmacy*   Lab Work: In 3 months, return for fasting labs............SABRA LIPIDS, CMET If you have labs (blood work) drawn today and your tests are completely normal, you will receive your results only by: MyChart Message (if you have MyChart) OR A paper copy in the mail If you have any lab test that is abnormal or we need to change your treatment, we will call you to review the results.   Testing/Procedures: No procedures were ordered during today's visit.   Your next appointment:   4 month(s)   Provider:   Aline Door, PA-C           Follow-Up: At [facility], you and your health needs are our priority.  As part of our continuing mission to provide you with exceptional heart care, we have created designated Provider Care Teams.  These Care Teams include your primary Cardiologist (physician) and Advanced Practice Providers (APPs -  Physician Assistants and Nurse Practitioners) who all work together to provide you with the care you need, when you need it. We recommend signing up for the patient portal called MyChart.  Sign up information is provided on this After Visit Summary.  MyChart is used to connect with patients for Virtual Visits (Telemedicine).  Patients are able to view lab/test results, encounter notes, upcoming appointments, etc.  Non-urgent messages can be sent to your provider as well.   To learn more about what you can do with MyChart, go to [email or web address].

## 2024-04-20 NOTE — Addendum Note (Signed)
 Addended by: WILHELMENIA ROERS on: 04/20/2024 03:11 PM   Modules accepted: Level of Service

## 2024-05-15 DIAGNOSIS — J449 Chronic obstructive pulmonary disease, unspecified: Secondary | ICD-10-CM | POA: Diagnosis not present

## 2024-05-18 ENCOUNTER — Other Ambulatory Visit: Payer: Self-pay | Admitting: Internal Medicine

## 2024-06-04 DIAGNOSIS — J449 Chronic obstructive pulmonary disease, unspecified: Secondary | ICD-10-CM | POA: Diagnosis not present

## 2024-06-06 ENCOUNTER — Telehealth: Payer: Self-pay | Admitting: Hematology and Oncology

## 2024-06-06 NOTE — Telephone Encounter (Signed)
 Left the patient a voicemail regarding the rescheduling of 08/21/24 appt changed to 08/22/24

## 2024-06-21 ENCOUNTER — Telehealth: Payer: Self-pay | Admitting: Cardiovascular Disease

## 2024-06-21 NOTE — Telephone Encounter (Signed)
 Returned patient's call, 2 identifiers used. Patient reports she was last seen on 04/19/24. At that time she reported occasional lightheadedness/dizziness. BP was 99/62 and HR was 68. BP as reported by patient over the past few days appears to be improved.   Patient reports that since then she has had worsening lightheadedness on standing. She states she has chronic lightheadedness on standing and is very careful on rising. She states it resolves quickly if she sits back down. She denies SOB or chest pain. She does not track her HR or weight at home. She has a history of rapid afib and syncope but denies any near-syncope, syncope or palpitations recently. Patient states she wears her 5 L o2 continuously as ordered.  Patient requests appointment, offered DOD appt for 06/27/23. Patient accepts and also verbalizes understanding of ED precautions. She agrees to go to ED if SOB occurs, if lightheadedness worsens or syncope occurs.

## 2024-06-21 NOTE — Telephone Encounter (Signed)
 Pt c/o BP issue: STAT if pt c/o blurred vision, one-sided weakness or slurred speech.  STAT if BP is GREATER than 180/120 TODAY.  STAT if BP is LESS than 90/60 and SYMPTOMATIC TODAY  1. What is your BP concern?   Patient is concerned her BP has been trending low  2. Have you taken any BP medication today?  Yes   3. What are your last 5 BP readings?  110/68 - Today 109/57 - Sunday, 12/28 110/57 - Saturday, 12/27 123/70 - Friday, 12/26  4. Are you having any other symptoms (ex. Dizziness, headache, blurred vision, passed out)? Severe dizziness  Patient is concerned her BP has been trending low and may need her medication adjusted.

## 2024-06-21 NOTE — Telephone Encounter (Signed)
 Agree  Thank you

## 2024-06-22 NOTE — Progress Notes (Deleted)
"   °  °  Cardiology Office Note Date:  06/22/2024  ID:  Dana Pruitt, DOB 08/13/1940, MRN [id number] PCP:  Dana Nottingham, MD  Cardiologist:  Dana VEAR Ren Donley, MD  No chief complaint on file.    Problems NICM (GDMT limited due to hypotension) 2/2 LBBB? CAC 7/23: 709 LHC 3/25: Mild irregularities, no stenosis RHC 3/25: RA 4, PA 33/14/21, PCWP 5 TTE 7/25: 40-45%, mod MR, mod-severe TR (MR has increased), normally function AVR Afib on Eliquis  Severe AI s/p AVR 11/20 (21 mm Inspiris Edwards) Ascending TAA 7/23 4.2 cm  Repeat CTA 8/26 CVA x 2 (2023, 2025) COPD on 5L CKDIII Bca s/p chemoradiation 2013 Pancreatic mass (likely not malignancy) M: AE200, AN20, AN2.5, EE10, FE20, LN25,   Visits 10/25: BP soft; coreg  to XL; not on spiro due to BP and no DN10 due to UTIs, EP discussion regarding CRT  1/26: TTE 7/26 for valvular insufficiency, EF, and dilated TAA, BP, LP    Discussed the use of AI scribe software for clinical note transcription with the patient, who gave verbal consent to proceed.  History of Present Illness     ROS: Please see the history of present illness. All other systems are reviewed and negative.   PHYSICAL EXAM: VS:  LMP  (LMP Unknown)  , BMI There is no height or weight on file to calculate BMI. GEN: Well nourished, well developed, in no acute distress HEENT: normal Neck: no JVD, carotid bruits, or masses Cardiac: ***RRR; no murmurs, rubs, or gallops,no edema  Respiratory:  CTAB bilaterally, normal work of breathing GI: soft, nontender, nondistended, + BS Extremities: No LE edema Skin: warm and dry, no rash Neuro:  Strength and sensation are intact  EKG: ***  Recent Labs: Reviewed  Studies: Reviewed  ASSESSMENT AND PLAN: Dana Pruitt is a 84 y.o. female who presents for new visit.  Assessment and Plan Assessment & Plan        Signed, Dana VEAR Ren Donley, MD  06/22/2024 8:22 AM    [facility] "

## 2024-06-26 ENCOUNTER — Ambulatory Visit

## 2024-06-26 DIAGNOSIS — J9611 Chronic respiratory failure with hypoxia: Secondary | ICD-10-CM

## 2024-06-26 DIAGNOSIS — I340 Nonrheumatic mitral (valve) insufficiency: Secondary | ICD-10-CM

## 2024-06-26 DIAGNOSIS — J449 Chronic obstructive pulmonary disease, unspecified: Secondary | ICD-10-CM

## 2024-06-26 DIAGNOSIS — I7121 Aneurysm of the ascending aorta, without rupture: Secondary | ICD-10-CM

## 2024-06-26 DIAGNOSIS — I480 Paroxysmal atrial fibrillation: Secondary | ICD-10-CM

## 2024-06-26 DIAGNOSIS — N1831 Chronic kidney disease, stage 3a: Secondary | ICD-10-CM

## 2024-06-26 DIAGNOSIS — I2510 Atherosclerotic heart disease of native coronary artery without angina pectoris: Secondary | ICD-10-CM

## 2024-06-26 DIAGNOSIS — I071 Rheumatic tricuspid insufficiency: Secondary | ICD-10-CM

## 2024-06-26 DIAGNOSIS — I428 Other cardiomyopathies: Secondary | ICD-10-CM

## 2024-07-27 ENCOUNTER — Inpatient Hospital Stay (HOSPITAL_COMMUNITY)
Admission: EM | Admit: 2024-07-27 | Source: Home / Self Care | Attending: Internal Medicine | Admitting: Internal Medicine

## 2024-07-27 ENCOUNTER — Emergency Department (HOSPITAL_COMMUNITY)

## 2024-07-27 ENCOUNTER — Encounter (HOSPITAL_COMMUNITY): Payer: Self-pay | Admitting: Internal Medicine

## 2024-07-27 ENCOUNTER — Other Ambulatory Visit: Payer: Self-pay

## 2024-07-27 DIAGNOSIS — J449 Chronic obstructive pulmonary disease, unspecified: Secondary | ICD-10-CM | POA: Diagnosis present

## 2024-07-27 DIAGNOSIS — E43 Unspecified severe protein-calorie malnutrition: Secondary | ICD-10-CM | POA: Diagnosis not present

## 2024-07-27 DIAGNOSIS — Z952 Presence of prosthetic heart valve: Secondary | ICD-10-CM

## 2024-07-27 DIAGNOSIS — N179 Acute kidney failure, unspecified: Secondary | ICD-10-CM

## 2024-07-27 DIAGNOSIS — K869 Disease of pancreas, unspecified: Secondary | ICD-10-CM | POA: Diagnosis present

## 2024-07-27 DIAGNOSIS — K922 Gastrointestinal hemorrhage, unspecified: Secondary | ICD-10-CM | POA: Diagnosis not present

## 2024-07-27 DIAGNOSIS — J441 Chronic obstructive pulmonary disease with (acute) exacerbation: Secondary | ICD-10-CM

## 2024-07-27 DIAGNOSIS — I480 Paroxysmal atrial fibrillation: Secondary | ICD-10-CM | POA: Diagnosis not present

## 2024-07-27 DIAGNOSIS — Z9981 Dependence on supplemental oxygen: Secondary | ICD-10-CM | POA: Insufficient documentation

## 2024-07-27 DIAGNOSIS — R0609 Other forms of dyspnea: Secondary | ICD-10-CM

## 2024-07-27 DIAGNOSIS — Z853 Personal history of malignant neoplasm of breast: Secondary | ICD-10-CM | POA: Insufficient documentation

## 2024-07-27 DIAGNOSIS — I5020 Unspecified systolic (congestive) heart failure: Secondary | ICD-10-CM | POA: Insufficient documentation

## 2024-07-27 DIAGNOSIS — J9611 Chronic respiratory failure with hypoxia: Secondary | ICD-10-CM | POA: Diagnosis not present

## 2024-07-27 DIAGNOSIS — D649 Anemia, unspecified: Principal | ICD-10-CM

## 2024-07-27 HISTORY — DX: Cyst of pancreas: K86.2

## 2024-07-27 LAB — BASIC METABOLIC PANEL WITH GFR
Anion gap: 12 (ref 5–15)
BUN: 24 mg/dL — ABNORMAL HIGH (ref 8–23)
CO2: 25 mmol/L (ref 22–32)
Calcium: 9.9 mg/dL (ref 8.9–10.3)
Chloride: 102 mmol/L (ref 98–111)
Creatinine, Ser: 1.6 mg/dL — ABNORMAL HIGH (ref 0.44–1.00)
GFR, Estimated: 32 mL/min — ABNORMAL LOW
Glucose, Bld: 130 mg/dL — ABNORMAL HIGH (ref 70–99)
Potassium: 4.5 mmol/L (ref 3.5–5.1)
Sodium: 139 mmol/L (ref 135–145)

## 2024-07-27 LAB — CBC
HCT: 23 % — ABNORMAL LOW (ref 36.0–46.0)
Hemoglobin: 6.8 g/dL — CL (ref 12.0–15.0)
MCH: 29.2 pg (ref 26.0–34.0)
MCHC: 29.6 g/dL — ABNORMAL LOW (ref 30.0–36.0)
MCV: 98.7 fL (ref 80.0–100.0)
Platelets: 260 10*3/uL (ref 150–400)
RBC: 2.33 MIL/uL — ABNORMAL LOW (ref 3.87–5.11)
RDW: 15.8 % — ABNORMAL HIGH (ref 11.5–15.5)
WBC: 6 10*3/uL (ref 4.0–10.5)
nRBC: 0 % (ref 0.0–0.2)

## 2024-07-27 LAB — PREPARE RBC (CROSSMATCH)

## 2024-07-27 LAB — POC OCCULT BLOOD, ED: Fecal Occult Bld: NEGATIVE

## 2024-07-27 MED ORDER — AMIODARONE HCL 200 MG PO TABS
200.0000 mg | ORAL_TABLET | Freq: Every day | ORAL | Status: AC
  Administered 2024-07-28: 200 mg via ORAL
  Filled 2024-07-27: qty 1

## 2024-07-27 MED ORDER — SODIUM CHLORIDE 0.9% IV SOLUTION: Freq: Once | INTRAVENOUS | Status: DC

## 2024-07-27 MED ORDER — BUDESON-GLYCOPYRROL-FORMOTEROL 160-9-4.8 MCG/ACT IN AERO
2.0000 | INHALATION_SPRAY | Freq: Two times a day (BID) | RESPIRATORY_TRACT | Status: DC
  Administered 2024-07-27: 2 via RESPIRATORY_TRACT
  Filled 2024-07-27: qty 5.9

## 2024-07-27 MED ORDER — SENNOSIDES-DOCUSATE SODIUM 8.6-50 MG PO TABS: 1.0000 | ORAL_TABLET | Freq: Every evening | ORAL | Status: AC | PRN

## 2024-07-27 MED ORDER — ENSIFENTRINE 3 MG/2.5ML IN SUSP: 2.5000 mL | Freq: Two times a day (BID) | RESPIRATORY_TRACT | Status: AC

## 2024-07-27 MED ORDER — SODIUM CHLORIDE 0.9% FLUSH
3.0000 mL | Freq: Two times a day (BID) | INTRAVENOUS | Status: AC
  Administered 2024-07-27 – 2024-07-28 (×3): 3 mL via INTRAVENOUS

## 2024-07-27 MED ORDER — ALBUTEROL SULFATE (2.5 MG/3ML) 0.083% IN NEBU: 2.5000 mg | INHALATION_SOLUTION | RESPIRATORY_TRACT | Status: AC | PRN

## 2024-07-27 MED ORDER — ONDANSETRON HCL 4 MG/2ML IJ SOLN: 4.0000 mg | Freq: Four times a day (QID) | INTRAMUSCULAR | Status: AC | PRN

## 2024-07-27 MED ORDER — FUROSEMIDE 40 MG PO TABS
20.0000 mg | ORAL_TABLET | Freq: Every day | ORAL | Status: AC
  Administered 2024-07-28: 20 mg via ORAL
  Filled 2024-07-27: qty 1

## 2024-07-27 MED ORDER — CARVEDILOL 12.5 MG PO TABS
12.5000 mg | ORAL_TABLET | Freq: Two times a day (BID) | ORAL | Status: AC
  Administered 2024-07-28 (×2): 12.5 mg via ORAL
  Filled 2024-07-27 (×2): qty 1

## 2024-07-27 MED ORDER — ACETAMINOPHEN 650 MG RE SUPP: 650.0000 mg | Freq: Four times a day (QID) | RECTAL | Status: AC | PRN

## 2024-07-27 MED ORDER — ONDANSETRON HCL 4 MG PO TABS: 4.0000 mg | ORAL_TABLET | Freq: Four times a day (QID) | ORAL | Status: AC | PRN

## 2024-07-27 MED ORDER — GUAIFENESIN-DM 100-10 MG/5ML PO SYRP: 5.0000 mL | ORAL_SOLUTION | ORAL | Status: AC | PRN

## 2024-07-27 MED ORDER — INFLUENZA VAC SPLIT HIGH-DOSE 0.5 ML IM SUSY
0.5000 mL | PREFILLED_SYRINGE | INTRAMUSCULAR | Status: AC
  Administered 2024-07-28: 0.5 mL via INTRAMUSCULAR
  Filled 2024-07-27: qty 0.5

## 2024-07-27 MED ORDER — ACETAMINOPHEN 325 MG PO TABS
650.0000 mg | ORAL_TABLET | Freq: Four times a day (QID) | ORAL | Status: AC | PRN
  Administered 2024-07-28: 650 mg via ORAL
  Filled 2024-07-27: qty 2

## 2024-07-27 NOTE — ED Notes (Signed)
 First poc with patient. Dana Pruitt axox4. GCS 15. Denies pain. Remains on continuous cardiac, pulse ox and bp monitoring.  No respiratory distress noted but currently on 5l/min via nasal cannula, same as home dose.  Dana Pruitt updated on plan of care and risks and benefits of blood transfusion.  Also educated on blood transfusion process. Dana Pruitt verbalizes understanding

## 2024-07-27 NOTE — ED Provider Notes (Signed)
 " [place] EMERGENCY DEPARTMENT AT [facility] Provider Note   CSN: [id number] Arrival date & time: 07/27/24  1454     Patient presents with: Abnormal Lab   Dana Pruitt is a 84 y.o. female with history of valve replacement, on Eliquis , presented to ED with general fatigue and shortness of breath.  She also has a history of COPD and wears 4 to 5 L baseline oxygen  at home.  Has a history of breast cancer in remission.  She reports that she had lab work done by her outpatient doctor told her hemoglobin was very low and she need to come to the hospital.  She does report fatigue, dizziness, lightheadedness at home.  Reports she has had intermittent dark stools.  She is on iron every other day.   HPI     Prior to Admission medications  Medication Sig Start Date End Date Taking? Authorizing Provider  albuterol  (PROVENTIL ) (2.5 MG/3ML) 0.083% nebulizer solution USE 1 VIAL VIA NEBULIZER EVERY 6 HOURS AS NEEDED FOR WHEEZING OR SHORTNESS OF BREATH 02/01/24   Jude Harden GAILS, MD  albuterol  (VENTOLIN  HFA) 108 [phone] Base) MCG/ACT inhaler INHALE 2 PUFFS INTO THE LUNGS EVERY 4-6 HOURS AS NEEDED FOR WHEEZING OR SHORTNESS OF BREATH 05/23/24   Cobb, Comer GAILS, NP  amiodarone  (PACERONE ) 200 MG tablet Take 1 tablet (200 mg total) by mouth daily. 12/29/23   Court Dorn PARAS, MD  atorvastatin  (LIPITOR) 20 MG tablet Take 2 tablets (40 mg total) by mouth daily. 04/19/24   Goodrich, Callie E, PA-C  budesonide -glycopyrrolate -formoterol  (BREZTRI  AEROSPHERE) 160-9-4.8 MCG/ACT AERO inhaler INHALE 2 PUFFS INTO THE LUNGS TWICE DAILY 03/07/24   Wert, Michael B, MD  carvedilol  (COREG ) 12.5 MG tablet Take 12.5 mg by mouth 2 (two) times daily with a meal. 11/29/23   [provider]  Cholecalciferol  (VITAMIN D3) 25 MCG (1000 UT) CAPS Take 1,000 Units by mouth daily. 02/10/23   [provider]  ELIQUIS  2.5 MG TABS tablet TAKE 1 TABLET BY MOUTH TWICE A DAY 04/12/24   Court Dorn PARAS, MD  Ensifentrine   (OHTUVAYRE ) 3 MG/2.5ML SUSP Inhale 2.5 mLs into the lungs 2 (two) times daily. 02/10/24   Cobb, Comer GAILS, NP  ezetimibe  (ZETIA ) 10 MG tablet Take 10 mg by mouth daily.    [provider]  FEROSUL 325 (65 Fe) MG tablet Take 325 mg by mouth every other day. 05/08/23   [provider]  fluticasone  (FLONASE ) 50 MCG/ACT nasal spray SHAKE LIQUID AND USE 2 SPRAYS IN EACH NOSTRIL DAILY 11/29/23   Jude Harden GAILS, MD  furosemide  (LASIX ) 20 MG tablet Take 20 mg by mouth daily. 12/02/23   [provider]  guaiFENesin -dextromethorphan  (ROBITUSSIN DM) 100-10 MG/5ML syrup Take 5 mLs by mouth every 4 (four) hours as needed for cough. 09/16/23   Christobal Guadalajara, MD  ipratropium (ATROVENT ) 0.06 % nasal spray Place 2 sprays into both nostrils 4 (four) times daily. 08/16/23   Cobb, Comer GAILS, NP  losartan  (COZAAR ) 25 MG tablet Take 25 mg by mouth daily.    [provider]  OXYGEN  Inhale 5 L/min into the lungs continuous.    [provider]  pantoprazole  (PROTONIX ) 40 MG tablet Take 40 mg by mouth daily. 11/25/23   [provider]  potassium chloride  (KLOR-CON  M) 10 MEQ tablet Take 10 mEq by mouth daily. 12/02/23   [provider]  sertraline  (ZOLOFT ) 100 MG tablet Take 200 mg by mouth daily.    [provider]  Allergies: Patient has no known allergies.    Review of Systems  Updated Vital Signs BP 123/71   Pulse 60   Temp 98.3 F (36.8 C) (Oral)   Resp 15   LMP  (LMP Unknown)   SpO2 100%   Physical Exam Constitutional:      General: She is not in acute distress. HENT:     Head: Normocephalic and atraumatic.  Eyes:     Conjunctiva/sclera: Conjunctivae normal.     Pupils: Pupils are equal, round, and reactive to light.  Cardiovascular:     Rate and Rhythm: Normal rate and regular rhythm.  Pulmonary:     Effort: Pulmonary effort is normal. No respiratory distress.  Abdominal:     General: There is no distension.     Tenderness: There  is no abdominal tenderness.  Genitourinary:    Comments: No stool in the rectal vault Skin:    General: Skin is warm and dry.  Neurological:     General: No focal deficit present.     Mental Status: She is alert. Mental status is at baseline.  Psychiatric:        Mood and Affect: Mood normal.        Behavior: Behavior normal.     (all labs ordered are listed, but only abnormal results are displayed) Labs Reviewed  BASIC METABOLIC PANEL WITH GFR - Abnormal; Notable for the following components:      Result Value   Glucose, Bld 130 (*)    BUN 24 (*)    Creatinine, Ser 1.60 (*)    GFR, Estimated 32 (*)    All other components within normal limits  CBC - Abnormal; Notable for the following components:   RBC 2.33 (*)    Hemoglobin 6.8 (*)    HCT 23.0 (*)    MCHC 29.6 (*)    RDW 15.8 (*)    All other components within normal limits  POC OCCULT BLOOD, ED  TYPE AND SCREEN  PREPARE RBC (CROSSMATCH)    EKG: EKG Interpretation Date/Time:  Thursday July 27 2024 15:14:28 EST Ventricular Rate:  61 PR Interval:  183 QRS Duration:  154 QT Interval:  496 QTC Calculation: 500 R Axis:   249  Text Interpretation: Sinus rhythm  Significant baseline wander Bifascicular block pattern noted on prior tracing December 27 2023 Confirmed by Cottie Cough [phone]) on 07/27/2024 4:02:19 PM  Radiology: DG Chest 2 View Result Date: 07/27/2024 CLINICAL DATA:  Shortness of breath. EXAM: CHEST - 2 VIEW COMPARISON:  December 27, 2023 FINDINGS: Multiple sternal wires are noted. The heart size and mediastinal contours are within normal limits. There is marked severity calcification of the aortic arch. An artificial aortic valve is noted. There is a small right pleural effusion. No acute infiltrate or pneumothorax is identified. Multilevel degenerative changes are present throughout the thoracic spine. IMPRESSION: 1. Evidence of prior median sternotomy and aortic valve replacement. 2. Small right pleural effusion.  Electronically Signed   By: Suzen Dials M.D.   On: 07/27/2024 16:23     .Critical Care  Performed by: Cottie Cough PARAS, MD Authorized by: Cottie Cough PARAS, MD   Critical care provider statement:    Critical care time (minutes):  40   Critical care time was exclusive of:  Separately billable procedures and treating other patients   Critical care was necessary to treat or prevent imminent or life-threatening deterioration of the following conditions:  Circulatory failure   Critical care was time spent personally  by me on the following activities:  Ordering and performing treatments and interventions, ordering and review of laboratory studies, ordering and review of radiographic studies, pulse oximetry, review of old charts, examination of patient and evaluation of patient's response to treatment Comments:     Blood transfusion for symptomatic anemia    Medications Ordered in the ED  0.9 %  sodium chloride  infusion (Manually program via Guardrails IV Fluids) (has no administration in time range)    Clinical Course as of 07/27/24 2341  Thu Jul 27, 2024  1854 Admitted to hospitalist [MT]    Clinical Course User Index [MT] Cottie Donnice PARAS, MD                                 Medical Decision Making Amount and/or Complexity of Data Reviewed Labs: ordered. Radiology: ordered.  Risk Prescription drug management. Decision regarding hospitalization.   This patient presents to the ED with concern for weakness, fatigue. This involves an extensive number of treatment options, and is a complaint that carries with it a high risk of complications and morbidity.  The differential diagnosis includes symptomatic anemia versus pulmonary edema or pneumonia versus metabolic derangement versus other  Co-morbidities that complicate the patient evaluation: Eliquis  use, risk of bleeding  External records from outside source obtained and reviewed including hemoglobin last year was 11  I  ordered and personally interpreted labs.  The pertinent results include: Anemia, hgb 6.8 -hemoccult negative (please note there was little to no stool for testing in rectal vault)  I ordered imaging studies including x-ray of the chest I independently visualized and interpreted imaging which showed no emergent findings I agree with the radiologist interpretation  The patient was maintained on a cardiac monitor.  I personally viewed and interpreted the cardiac monitored which showed an underlying rhythm of: Sinus rhythm  Per my interpretation the patient's ECG shows sinus rhythm at baseline wander  I ordered medication including blood cell transfusion for symptomatic anemia  I have reviewed the patients home medicines and have made adjustments as needed  Test Considered: Lower suspicion for acute PE.  No indication for angiogram imaging.   After the interventions noted above, I reevaluated the patient and found that they have: stayed the same   Dispostion:  After consideration of the diagnostic results and the patients response to treatment, I feel that the patent would benefit from medical admission.       Final diagnoses:  Symptomatic anemia    ED Discharge Orders     None          Cottie Donnice PARAS, MD 07/27/24 2341  "

## 2024-07-27 NOTE — ED Triage Notes (Signed)
 Pt reports PCP called her and informed her that HGB was 5.9, pt with copd 5L at baseline- but reports feels more SOB than normal, hx of breast CA- in remission, past valve replacement.

## 2024-07-27 NOTE — H&P (Signed)
 " History and Physical    Patient: Dana Pruitt FMW:[id number] DOB: [birth year] DOA: 07/27/2024 DOS: the patient was seen and examined on 07/27/2024 PCP: Clarice Nottingham, MD  Patient coming from: Home  Chief Complaint:  Chief Complaint  Patient presents with   Abnormal Lab   HPI: Dana Pruitt is a 84 y.o. female with medical history significant for paroxysmal atrial fibrillation on Eliquis  and amiodarone , COPD with 5 L O2 nasal cannula at all times, history of stroke, hypertension and hyperlipidemia who presents because her primary doctor called her and told her to come to the ED.  The patient has been feeling extremely weak and much more short of breath than baseline for the last 3 days.  She went to see her primary doctor who did bloodwork.  Today the blood work came back with a hemoglobin of 5.9 and she was told to come to the ED. The patient has not seen any bright red blood but she has noticed intermittent black stools over the last 6 months.  It comes and goes.  She says she will see the black tarry stools for a couple of days in a row and then will disappear.  She has not had any black tarry stool this week. She last took her Eliquis  this morning. In the emergency department the patient had blood work work which revealed a hemoglobin of 6.8.  She was afebrile.  Her rectal exam revealed no stool at all.  Her rectal mucosa was Hemoccult negative. White blood cell count 6.0.  Platelet count 260.  Her creatinine was 1.6.  Her last creatinine about 6 months ago was 1.4.  When seated the patient has no symptoms at all.  She mainly feels the extra shortness of breath with ambulation. A blood transfusion was ordered in the emergency department and the hospitalists were called to admit the patient.   Review of Systems: As mentioned in the history of present illness. All other systems reviewed and are negative. Past Medical History:  Diagnosis Date   Anemia    several times over the years  (10/17/2012)   Anxiety    Breast cancer (HCC) 02/2010   right breast   CAD (coronary artery disease)    a. LHC 12/2018: mild non-obstructive CAD   Carcinoma of breast treated with adjuvant hormone therapy (HCC) 02/2010   Femara    Chronic systolic CHF    a. Echo in 2015: LVEF of 35-40%, b. 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 SN: [id number];  Surgeon: Fleeta Ochoa, Maude, MD;  Location: [facility] OR;  Service: Open Heart Surgery;  Laterality: N/A;   APPENDECTOMY  1974   BREAST BIOPSY Right 02/27/10   Needle core Biopsy; Invasive Mammary; ER/PR Positive, Her-2  Neu negative, Ki-67 22%   BREAST LUMPECTOMY WITH SENTINEL LYMPH NODE BIOPSY Right 07/03/11   Invasive Ductal Carcinoma;0/3 nodes negative,; ER,PR Positive, Her-2 Neg; Ki-67 22%   CESAREAN SECTION  1970; 1974   COLONOSCOPY     DILATION AND CURETTAGE OF UTERUS  1970's   RIGHT HEART CATH AND CORONARY ANGIOGRAPHY N/A 08/31/2023   Procedure: RIGHT HEART CATH AND CORONARY ANGIOGRAPHY;  Surgeon: Wonda Sharper, MD;  Location: [facility] INVASIVE CV LAB;  Service: Cardiovascular;  Laterality: N/A;   RIGHT/LEFT HEART CATH AND CORONARY ANGIOGRAPHY N/A 01/02/2019   Procedure: RIGHT/LEFT HEART CATH AND CORONARY ANGIOGRAPHY;  Surgeon: Rolan Ezra RAMAN, MD;   Location: [facility] INVASIVE CV LAB;  Service: Cardiovascular;  Laterality: N/A;   TEE WITHOUT CARDIOVERSION N/A 01/31/2019   Procedure: TRANSESOPHAGEAL ECHOCARDIOGRAM (TEE);  Surgeon: Fleeta Ochoa, Maude, MD;  Location: [facility] OR;  Service: Open Heart Surgery;  Laterality: N/A;   TUBAL LIGATION  1974   WISDOM TOOTH EXTRACTION     Social History:  reports that she quit smoking about 34 years ago. Her smoking use included cigarettes. She started smoking about 66 years ago. She has a 96.3 pack-year smoking history. She has never used smokeless tobacco. She reports that she does not currently use alcohol. She reports that she does not use drugs.  Allergies[1]  Family History  Problem Relation Age of Onset   Heart disease Mother    Cancer Father    Heart disease Maternal Grandmother    Heart disease Maternal Grandfather    Cancer Paternal Grandmother    Lung disease Neg Hx    Colon cancer Neg Hx    Esophageal cancer Neg Hx    Inflammatory bowel disease Neg Hx    Liver disease Neg Hx    Pancreatic cancer Neg Hx    Rectal cancer Neg Hx    Stomach cancer Neg Hx     Prior to Admission medications  Medication Sig Start Date End Date Taking? Authorizing Provider  ferrous sulfate  325 (65 FE) MG tablet Take 325 mg by mouth every other day.   Yes [provider]  OXYGEN  Inhale 5 L/min into the lungs continuous.   Yes [provider]  albuterol  (PROVENTIL ) (2.5 MG/3ML) 0.083% nebulizer solution USE 1 VIAL VIA NEBULIZER EVERY 6 HOURS AS NEEDED FOR WHEEZING OR SHORTNESS OF BREATH 02/01/24   Jude Harden GAILS, MD  albuterol  (VENTOLIN  HFA) 108 (90 Base) MCG/ACT inhaler INHALE 2 PUFFS INTO THE LUNGS EVERY 4-6 HOURS AS NEEDED FOR WHEEZING OR SHORTNESS OF BREATH 05/23/24   Cobb, Comer GAILS, NP  amiodarone  (PACERONE ) 200 MG tablet Take 1 tablet (200 mg total) by mouth daily. 12/29/23   Court Dorn PARAS, MD  atorvastatin  (LIPITOR) 20 MG tablet Take 2 tablets (40 mg total) by mouth daily. 04/19/24    Goodrich, Callie E, PA-C  budesonide -glycopyrrolate -formoterol  (BREZTRI  AEROSPHERE) 160-9-4.8 MCG/ACT AERO inhaler INHALE 2 PUFFS INTO THE LUNGS TWICE DAILY 03/07/24   Wert, Michael B, MD  carvedilol  (COREG ) 12.5 MG tablet Take 12.5 mg by mouth 2 (two) times daily with a meal. 11/29/23   [provider]  Cholecalciferol  (VITAMIN D3) 25 MCG (1000 UT) CAPS Take 1,000 Units by mouth daily. 02/10/23   [provider]  doxycycline  (VIBRAMYCIN ) 100 MG capsule Take 100 mg by mouth 2 (two) times daily. 07/24/24 07/29/24  [provider]  ELIQUIS  2.5 MG TABS tablet TAKE 1 TABLET BY MOUTH TWICE A DAY 04/12/24   Court Dorn PARAS, MD  Ensifentrine  (OHTUVAYRE ) 3 MG/2.5ML  SUSP Inhale 2.5 mLs into the lungs 2 (two) times daily. 02/10/24   Cobb, Comer GAILS, NP  ezetimibe  (ZETIA ) 10 MG tablet Take 10 mg by mouth daily.    [provider]  FEROSUL 325 (65 Fe) MG tablet Take 325 mg by mouth every other day. 05/08/23   [provider]  fluticasone  (FLONASE ) 50 MCG/ACT nasal spray SHAKE LIQUID AND USE 2 SPRAYS IN EACH NOSTRIL DAILY 11/29/23   Jude Harden GAILS, MD  furosemide  (LASIX ) 20 MG tablet Take 20 mg by mouth daily. 12/02/23   [provider]  guaiFENesin -dextromethorphan  (ROBITUSSIN DM) 100-10 MG/5ML syrup Take 5 mLs by mouth every 4 (four) hours as needed for cough. 09/16/23   Christobal Guadalajara, MD  ipratropium (ATROVENT ) 0.06 % nasal spray Place 2 sprays into both nostrils 4 (four) times daily. 08/16/23   Cobb, Comer GAILS, NP  losartan  (COZAAR ) 25 MG tablet Take 25 mg by mouth daily.    [provider]  pantoprazole  (PROTONIX ) 40 MG tablet Take 40 mg by mouth daily. 11/25/23   [provider]  potassium chloride  (KLOR-CON  M) 10 MEQ tablet Take 10 mEq by mouth daily. 12/02/23   [provider]  predniSONE  (DELTASONE ) 20 MG tablet Take 20 mg by mouth 2 (two) times daily with a meal. 07/24/24 07/29/24  [provider]  sertraline  (ZOLOFT ) 100 MG tablet  Take 200 mg by mouth daily.    [provider]    Physical Exam: Vitals:   07/27/24 1510 07/27/24 1514 07/27/24 1545 07/27/24 1845  BP: 113/63  123/71 (!) 124/93  Pulse: 61  60 64  Resp: 19  15 (!) 23  Temp:  98.3 F (36.8 C)  97.7 F (36.5 C)  TempSrc:  Oral  Oral  SpO2: 96%  100% 99%   Physical Exam:  General: No acute distress, frail malnourished HEENT: Normocephalic, atraumatic, PERRL Cardiovascular: Normal rate and rhythm. Distal pulses intact. Pulmonary: Normal pulmonary effort, normal breath sounds Gastrointestinal: Nondistended abdomen, soft, non-tender, normoactive bowel sounds Musculoskeletal:Normal ROM, no lower ext edema Lymphadenopathy: No cervical LAD. Skin: Skin is warm and dry. Neuro: No focal deficits noted, AAOx3. PSYCH: Attentive and cooperative  Data Reviewed:  Results for orders placed or performed during the hospital encounter of 07/27/24 (from the past 24 hours)  Basic metabolic panel     Status: Abnormal   Collection Time: 07/27/24  3:19 PM  Result Value Ref Range   Sodium 139 135 - 145 mmol/L   Potassium 4.5 3.5 - 5.1 mmol/L   Chloride 102 98 - 111 mmol/L   CO2 25 22 - 32 mmol/L   Glucose, Bld 130 (H) 70 - 99 mg/dL   BUN 24 (H) 8 - 23 mg/dL   Creatinine, Ser 8.39 (H) 0.44 - 1.00 mg/dL   Calcium  9.9 8.9 - 10.3 mg/dL   GFR, Estimated 32 (L) >60 mL/min   Anion gap 12 5 - 15  CBC     Status: Abnormal   Collection Time: 07/27/24  3:19 PM  Result Value Ref Range   WBC 6.0 4.0 - 10.5 K/uL   RBC 2.33 (L) 3.87 - 5.11 MIL/uL   Hemoglobin 6.8 (LL) 12.0 - 15.0 g/dL   HCT 76.9 (L) 63.9 - 53.9 %   MCV 98.7 80.0 - 100.0 fL   MCH 29.2 26.0 - 34.0 pg   MCHC 29.6 (L) 30.0 - 36.0 g/dL   RDW 84.1 (H) 88.4 - 84.4 %   Platelets 260 150 - 400 K/uL  nRBC 0.0 0.0 - 0.2 %  Type and screen [facility]     Status: None   Collection Time: 07/27/24  3:19 PM  Result Value Ref Range   ABO/RH(D) A POS    Antibody Screen NEG    Sample  Expiration      07/30/2024,2359 Performed at [facility], [address], [place], [place] [address]   POC occult blood, ED     Status: None   Collection Time: 07/27/24  5:32 PM  Result Value Ref Range   Fecal Occult Bld NEGATIVE NEGATIVE     Assessment and Plan: Symptomatic anemia, likely due to blood loss - Hold Eliquis  - [facility] consulted - NPO in am  2. Pafib, h/o AVR - Holding Eliquis  due to presumed GI bleed. She follows with [facility], Dr Court - Continue Amio - Continue Coreg  starting tomorrow  3. H/o CVA -the patient is very scared of having a stroke.  She asked me repeatedly how long she will have to be off her Eliquis .  I have let her know that I do not have the answer for that yet.  4. Htn - Continue Coreg   5. HLD -continue Lipitor  6. CHF - Cath 08/2023 showed no sig. CAD. EF=40% per echo 12/2023  7. AKI - Monitor creatitnine after transfusion    Advance Care Planning:   Code Status: Limited: Do not attempt resuscitation (DNR) -DNR-LIMITED -Do Not Intubate/DNI the patient names her husband as her surrogate decision maker and says she has filled out her paperwork and she is DNR.  Consults: [facility]  Family Communication: None  Severity of Illness: The appropriate patient status for this patient is INPATIENT. Inpatient status is judged to be reasonable and necessary in order to provide the required intensity of service to ensure the patient's safety. The patient's presenting symptoms, physical exam findings, and initial radiographic and laboratory data in the context of their chronic comorbidities is felt to place them at high risk for further clinical deterioration. Furthermore, it is not anticipated that the patient will be medically stable for discharge from the hospital within 2 midnights of admission.   * I certify that at the point of admission it is my clinical judgment that the patient will require inpatient hospital care  spanning beyond 2 midnights from the point of admission due to high intensity of service, high risk for further deterioration and high frequency of surveillance required.*  Author: ARTHEA CHILD, MD 07/27/2024 8:11 PM  For on call review [email or web address].     [1] No Known Allergies  "

## 2024-07-28 ENCOUNTER — Encounter (HOSPITAL_COMMUNITY): Payer: Self-pay | Admitting: Internal Medicine

## 2024-07-28 LAB — TYPE AND SCREEN
ABO/RH(D): A POS
Antibody Screen: NEGATIVE
Unit division: 0
Unit division: 0

## 2024-07-28 LAB — BASIC METABOLIC PANEL WITH GFR
Anion gap: 10 (ref 5–15)
BUN: 22 mg/dL (ref 8–23)
CO2: 27 mmol/L (ref 22–32)
Calcium: 9.4 mg/dL (ref 8.9–10.3)
Chloride: 105 mmol/L (ref 98–111)
Creatinine, Ser: 1.44 mg/dL — ABNORMAL HIGH (ref 0.44–1.00)
GFR, Estimated: 36 mL/min — ABNORMAL LOW
Glucose, Bld: 100 mg/dL — ABNORMAL HIGH (ref 70–99)
Potassium: 3.9 mmol/L (ref 3.5–5.1)
Sodium: 141 mmol/L (ref 135–145)

## 2024-07-28 LAB — CBC WITH DIFFERENTIAL/PLATELET
Abs Immature Granulocytes: 0.06 10*3/uL (ref 0.00–0.07)
Basophils Absolute: 0 10*3/uL (ref 0.0–0.1)
Basophils Relative: 0 %
Eosinophils Absolute: 0.2 10*3/uL (ref 0.0–0.5)
Eosinophils Relative: 3 %
HCT: 26.3 % — ABNORMAL LOW (ref 36.0–46.0)
Hemoglobin: 8.3 g/dL — ABNORMAL LOW (ref 12.0–15.0)
Immature Granulocytes: 1 %
Lymphocytes Relative: 8 %
Lymphs Abs: 0.6 10*3/uL — ABNORMAL LOW (ref 0.7–4.0)
MCH: 29.4 pg (ref 26.0–34.0)
MCHC: 31.6 g/dL (ref 30.0–36.0)
MCV: 93.3 fL (ref 80.0–100.0)
Monocytes Absolute: 0.5 10*3/uL (ref 0.1–1.0)
Monocytes Relative: 7 %
Neutro Abs: 5.8 10*3/uL (ref 1.7–7.7)
Neutrophils Relative %: 81 %
Platelets: 209 10*3/uL (ref 150–400)
RBC: 2.82 MIL/uL — ABNORMAL LOW (ref 3.87–5.11)
RDW: 15.1 % (ref 11.5–15.5)
WBC: 7.2 10*3/uL (ref 4.0–10.5)
nRBC: 0.3 % — ABNORMAL HIGH (ref 0.0–0.2)

## 2024-07-28 LAB — BPAM RBC
Blood Product Expiration Date: 202602282359
Blood Product Expiration Date: 202603052359
ISSUE DATE / TIME: 202602052027
ISSUE DATE / TIME: 202602052354
Unit Type and Rh: 6200
Unit Type and Rh: 6200

## 2024-07-28 LAB — PHOSPHORUS: Phosphorus: 3.7 mg/dL (ref 2.5–4.6)

## 2024-07-28 LAB — COMPREHENSIVE METABOLIC PANEL WITH GFR
ALT: 25 U/L (ref 0–44)
AST: 27 U/L (ref 15–41)
Albumin: 3.7 g/dL (ref 3.5–5.0)
Alkaline Phosphatase: 36 U/L — ABNORMAL LOW (ref 38–126)
Anion gap: 9 (ref 5–15)
BUN: 22 mg/dL (ref 8–23)
CO2: 26 mmol/L (ref 22–32)
Calcium: 9.3 mg/dL (ref 8.9–10.3)
Chloride: 104 mmol/L (ref 98–111)
Creatinine, Ser: 1.36 mg/dL — ABNORMAL HIGH (ref 0.44–1.00)
GFR, Estimated: 39 mL/min — ABNORMAL LOW
Glucose, Bld: 112 mg/dL — ABNORMAL HIGH (ref 70–99)
Potassium: 3.9 mmol/L (ref 3.5–5.1)
Sodium: 139 mmol/L (ref 135–145)
Total Bilirubin: 0.5 mg/dL (ref 0.0–1.2)
Total Protein: 5.6 g/dL — ABNORMAL LOW (ref 6.5–8.1)

## 2024-07-28 LAB — CBC
HCT: 27.1 % — ABNORMAL LOW (ref 36.0–46.0)
Hemoglobin: 8.8 g/dL — ABNORMAL LOW (ref 12.0–15.0)
MCH: 30.4 pg (ref 26.0–34.0)
MCHC: 32.5 g/dL (ref 30.0–36.0)
MCV: 93.8 fL (ref 80.0–100.0)
Platelets: 214 10*3/uL (ref 150–400)
RBC: 2.89 MIL/uL — ABNORMAL LOW (ref 3.87–5.11)
RDW: 15.2 % (ref 11.5–15.5)
WBC: 7.1 10*3/uL (ref 4.0–10.5)
nRBC: 0 % (ref 0.0–0.2)

## 2024-07-28 LAB — MAGNESIUM: Magnesium: 2 mg/dL (ref 1.7–2.4)

## 2024-07-28 MED ORDER — SERTRALINE HCL 50 MG PO TABS
150.0000 mg | ORAL_TABLET | Freq: Every day | ORAL | Status: AC
  Administered 2024-07-28: 150 mg via ORAL
  Filled 2024-07-28: qty 1

## 2024-07-28 MED ORDER — GUAIFENESIN ER 600 MG PO TB12
1200.0000 mg | ORAL_TABLET | Freq: Two times a day (BID) | ORAL | Status: AC
  Administered 2024-07-28 (×2): 1200 mg via ORAL
  Filled 2024-07-28 (×2): qty 2

## 2024-07-28 MED ORDER — MELATONIN 5 MG PO TABS
5.0000 mg | ORAL_TABLET | Freq: Every evening | ORAL | Status: AC | PRN
  Administered 2024-07-28 (×2): 5 mg via ORAL
  Filled 2024-07-28 (×2): qty 1

## 2024-07-28 MED ORDER — PANTOPRAZOLE SODIUM 40 MG PO TBEC
40.0000 mg | DELAYED_RELEASE_TABLET | Freq: Every day | ORAL | Status: AC
  Administered 2024-07-28: 40 mg via ORAL
  Filled 2024-07-28: qty 1

## 2024-07-28 MED ORDER — ATORVASTATIN CALCIUM 10 MG PO TABS
20.0000 mg | ORAL_TABLET | Freq: Every day | ORAL | Status: AC
  Administered 2024-07-28: 20 mg via ORAL
  Filled 2024-07-28: qty 2

## 2024-07-28 MED ORDER — EZETIMIBE 10 MG PO TABS
10.0000 mg | ORAL_TABLET | Freq: Every day | ORAL | Status: AC
  Administered 2024-07-28: 10 mg via ORAL
  Filled 2024-07-28: qty 1

## 2024-07-28 MED ORDER — BUDESON-GLYCOPYRROL-FORMOTEROL 160-9-4.8 MCG/ACT IN AERO
2.0000 | INHALATION_SPRAY | Freq: Two times a day (BID) | RESPIRATORY_TRACT | Status: AC
  Administered 2024-07-28 (×2): 2 via RESPIRATORY_TRACT
  Filled 2024-07-28: qty 5.9

## 2024-07-28 NOTE — Plan of Care (Signed)
  Problem: Activity: Goal: Risk for activity intolerance will decrease Outcome: Progressing   Problem: Nutrition: Goal: Adequate nutrition will be maintained Outcome: Progressing   Problem: Coping: Goal: Level of anxiety will decrease Outcome: Progressing   Problem: Pain Managment: Goal: General experience of comfort will improve and/or be controlled Outcome: Progressing   Problem: Safety: Goal: Ability to remain free from injury will improve Outcome: Progressing

## 2024-07-28 NOTE — Plan of Care (Signed)

## 2024-07-28 NOTE — Progress Notes (Signed)
 " PROGRESS NOTE    Dana Pruitt  FMW:[id number] DOB: 1941-02-16 DOA: 07/27/2024 PCP: Clarice Nottingham, MD   Brief Narrative:  Dana Pruitt is a 84 y.o. female with medical history significant for paroxysmal atrial fibrillation on Eliquis  and amiodarone , COPD with 5 L O2 nasal cannula at all times, history of stroke, hypertension and hyperlipidemia who presents because her primary doctor called her and told her to come to the ED due to a Hgb of 5.9.  The patient has been feeling extremely weak and much more short of breath than baseline for the last 3 days. The patient has not seen any bright red blood but she has noticed intermittent black stools over the last 6 months.  GI was consulted and she was FOBT Negative but they are pursing an Upper Endoscopy on 07/29/24.   Assessment and Plan:  Symptomatic Anemia, likely due to blood loss: Continue to Hold Apixaban . Hgb/Hct Trend:  Recent Labs  Lab 07/27/24 1519 07/28/24 0815 07/28/24 1036  HGB 6.8* 8.8* 8.3*  HCT 23.0* 27.1* 26.3*  MCV 98.7 93.8 93.3  -S/p Transfusion of 2 units of pRBCs. [facility] consulted and plan is for Upper Endoscopy on 07/29/24 to look for potential bleeding sources; Will need to remain NPO in am -Hold Ferrous Sulfate  325 mg po EOD -Further care per GI   PAF w/ Hx of RVR: Continue to Hold Apixaban . C/w Amiodarone  200 mg po Daily and Carvedilol  12.5 mg po BID. CTM on Telemetry   H/o CVA: C/w Atorvastatin  20 mg po Daily. Continue to Hold Apixaban  as above. CTM on Telemetry    Essential HTN: C/w Carvedilol  12.5 mg po BID and Furosemide  20 mg po Daily. CTM BP per Protocol. Last BP reading was 143/77   HLD: C/w Ezetimibe  10 mg po Daily and Atorvastatin  20 mg po Daily   Chronic Systolic CHF: Appears Euvolemic. Cath 08/2023 showed no sig. CAD. EF=40% per echo 12/2023. C/w Furosemide  20 mg po daily and Carvedilol  12.5 mg po BID. CTM For S/Sx of Volume Overload  COPD w/ Chronic Respiratory Failure on 5 Liters: C/w Breztri  2  puff IH BID and Ensifentrine  2.5 mL IH BID; C/w Albuterol  2.5 mg Neb q4hprn Wheezing. C/w Guaifenesin  1200 mg po BID  Depression and Anxiety: C/w Sertraline  150 mg po Daily   AKI on CKD Stage 3a: Baseline Cr ~ 0.9-1.2. BUN/Cr Trend went from 24/1.60 -> 22/1.44 -> 22/1.36. Avoid Nephrotoxic Medications, Contrast Dyes, Hypotension and Dehydration to Ensure Adequate Renal Perfusion and will need to Renally Adjust Meds; CTM & Trend Renal Function carefully and repeat CMP in the AM   Hx of Breast Cancer: noted.   GERD/GI Prophylaxis: C/w PPI w/ Pantoprazole  40 mg po Daily    DVT prophylaxis: SCDs Start: 07/27/24 1947    Code Status: Limited: Do not attempt resuscitation (DNR) -DNR-LIMITED -Do Not Intubate/DNI  Family Communication: No family present @ bedside   Disposition Plan:  Level of care: Telemetry Status is: Inpatient Remains inpatient appropriate because: Needs further clinical improvement and clearance by the Specialists    Consultants:  Gastroenterology   Procedures:  As delineated as above   Antimicrobials:  Anti-infectives (From admission, onward)    None       Subjective: Seen and examined at bedside and states that she is feeling weak.  Understands that she is undergoing upper endoscopy tomorrow.  No nausea or vomiting.  Feels okay but tired however feeling little bit better.  Denied any other concerns or complaints  at this time.  Objective: Vitals:   07/28/24 0543 07/28/24 0921 07/28/24 0933 07/28/24 1100  BP: 130/71 (!) 143/77 (!) 143/77   Pulse: (!) 59 (!) 59 (!) 59   Resp: 17 20    Temp: 98 F (36.7 C) 97.9 F (36.6 C)    TempSrc: Oral     SpO2: 96% 96%  98%  Weight:      Height:        Intake/Output Summary (Last 24 hours) at 07/28/2024 1541 Last data filed at 07/28/2024 0320 Gross per 24 hour  Intake 653.75 ml  Output --  Net 653.75 ml   Filed Weights   07/27/24 2014  Weight: 53.1 kg   Examination: Physical Exam:  Constitutional: Thin  elderly chronically ill-appearing Caucasian female in no acute distress Respiratory: Diminished to auscultation bilaterally with some coarse breath sounds, no wheezing, rales, rhonchi or crackles. Normal respiratory effort and patient is not tachypenic. No accessory muscle use.  Unlabored breathing but is wearing supplemental oxygen  via nasal cannula 5 L Cardiovascular: RRR, no murmurs / rubs / gallops. S1 and S2 auscultated. No extremity edema. Abdomen: Soft, non-tender, non-distended. Bowel sounds positive.  GU: Deferred. Musculoskeletal: No clubbing / cyanosis of digits/nails. No joint deformity upper and lower extremities.  Skin: No rashes, lesions, ulcers limited skin evaluation. No induration; Warm and dry.  Neurologic: CN 2-12 grossly intact with no focal deficits. Romberg sign and cerebellar reflexes not assessed.  Psychiatric: Normal judgment and insight. Alert and oriented x 3. Normal mood and appropriate affect.   Data Reviewed: I have personally reviewed following labs and imaging studies  CBC: Recent Labs  Lab 07/27/24 1519 07/28/24 0815 07/28/24 1036  WBC 6.0 7.1 7.2  NEUTROABS  --   --  5.8  HGB 6.8* 8.8* 8.3*  HCT 23.0* 27.1* 26.3*  MCV 98.7 93.8 93.3  PLT 260 214 209   Basic Metabolic Panel: Recent Labs  Lab 07/27/24 1519 07/28/24 0815 07/28/24 1036  NA 139 141 139  K 4.5 3.9 3.9  CL 102 105 104  CO2 25 27 26   GLUCOSE 130* 100* 112*  BUN 24* 22 22  CREATININE 1.60* 1.44* 1.36*  CALCIUM  9.9 9.4 9.3  MG  --   --  2.0  PHOS  --   --  3.7   GFR: Estimated Creatinine Clearance: 26.4 mL/min (A) (by C-G formula based on SCr of 1.36 mg/dL (H)). Liver Function Tests: Recent Labs  Lab 07/28/24 1036  AST 27  ALT 25  ALKPHOS 36*  BILITOT 0.5  PROT 5.6*  ALBUMIN  3.7   No results for input(s): LIPASE, AMYLASE in the last 168 hours. No results for input(s): AMMONIA in the last 168 hours. Coagulation Profile: No results for input(s): INR, PROTIME  in the last 168 hours. Cardiac Enzymes: No results for input(s): CKTOTAL, CKMB, CKMBINDEX, TROPONINI in the last 168 hours. BNP (last 3 results) Recent Labs    08/16/23 1643  PROBNP 473.0*   HbA1C: No results for input(s): HGBA1C in the last 72 hours. CBG: No results for input(s): GLUCAP in the last 168 hours. Lipid Profile: No results for input(s): CHOL, HDL, LDLCALC, TRIG, CHOLHDL, LDLDIRECT in the last 72 hours. Thyroid  Function Tests: No results for input(s): TSH, T4TOTAL, FREET4, T3FREE, THYROIDAB in the last 72 hours. Anemia Panel: No results for input(s): VITAMINB12, FOLATE, FERRITIN, TIBC, IRON, RETICCTPCT in the last 72 hours. Sepsis Labs: No results for input(s): PROCALCITON, LATICACIDVEN in the last 168 hours.  No results found  for this or any previous visit (from the past 240 hours).   Radiology Studies: DG Chest 2 View Result Date: 07/27/2024 CLINICAL DATA:  Shortness of breath. EXAM: CHEST - 2 VIEW COMPARISON:  December 27, 2023 FINDINGS: Multiple sternal wires are noted. The heart size and mediastinal contours are within normal limits. There is marked severity calcification of the aortic arch. An artificial aortic valve is noted. There is a small right pleural effusion. No acute infiltrate or pneumothorax is identified. Multilevel degenerative changes are present throughout the thoracic spine. IMPRESSION: 1. Evidence of prior median sternotomy and aortic valve replacement. 2. Small right pleural effusion. Electronically Signed   By: Suzen Dials M.D.   On: 07/27/2024 16:23   Scheduled Meds:  amiodarone   200 mg Oral Daily   atorvastatin   20 mg Oral Daily   budesonide -glycopyrrolate -formoterol   2 puff Inhalation BID   carvedilol   12.5 mg Oral BID WC   Ensifentrine   2.5 mL Inhalation BID   ezetimibe   10 mg Oral Daily   furosemide   20 mg Oral Daily   guaiFENesin   1,200 mg Oral BID   Influenza vac split trivalent PF  0.5  mL Intramuscular Tomorrow-1000   pantoprazole   40 mg Oral Daily   sertraline   150 mg Oral Daily   sodium chloride  flush  3 mL Intravenous Q12H   Continuous Infusions:   LOS: 1 day   Alejandro Marker, DO [facility] Available via Epic secure chat 7am-7pm After these hours, please refer to coverage provider listed on [email or web address] 07/28/2024, 3:41 PM  "

## 2024-07-28 NOTE — Progress Notes (Signed)
" ° ° °  PROCEDURAL EXPEDITER PROGRESS NOTE  Patient Name: Dana Pruitt  DOB:Jan 19, 1941 Date of Admission: 07/27/2024  Date of Assessment:07/28/24   -------------------------------------------------------------------------------------------------------------------   Brief clinical summary: Pt to Endo tomorrow for upper gastrointestinal endoscopy, possible enteroscopy   Orders in place:  Yes   Labs, test, and orders reviewed: Y  Requires surgical clearance:  No  Barriers noted: N/A   -------------------------------------------------------------------------------------------------------------------  [facility] [employer], [place], [place] Please contact us  directly via secure chat (search for [facility]) or by calling us  at [phone] [facility]).  "

## 2024-07-28 NOTE — Consult Note (Addendum)
 "  Referring Provider: Dr. Arthea, TRH Primary Care Physician:  Clarice Nottingham, MD Primary Gastroenterologist:  Dr. Wilhelmenia  Reason for Consultation:  Anemia, ? GI bleed  HPI: Dana Pruitt is a 84 y.o. female with medical history significant for paroxysmal atrial fibrillation on Eliquis  and amiodarone , COPD with 5 L O2 nasal cannula at all times, history of stroke, hypertension, and hyperlipidemia who presents because her primary doctor called her and told her to come to the ED.  The patient has been feeling extremely weak and much more short of breath than baseline for the last 3 days.  She went to see her primary doctor who did bloodwork.  On 2/5 the blood work came back with a hemoglobin of 5.9 grams and she was told to come to the ED.  The patient has not seen any bright red blood but she has noticed intermittent black stools over the last 6 months.  It comes and goes.  She says she will see the black tarry stools for a couple of days in a row and then will disappear.  She has not had any black tarry stool in well over a week.  She says that she moves her bowel regularly for the most part with only occasional constipation, mild.  No abdominal pain.  She has had no history of ulcers.  Does not use NSAIDs.  No upper GI symptoms including heartburn or reflux, which is a very rare occasion that she would need to take some Tums or Pepcid .  She does have pantoprazole  40 mg daily on her medication list.  No dysphagia or unintentional weight loss.  She is never had an upper endoscopy.  She had a colonoscopy years ago with Dr. Luis and was told that she did not need anymore due to age, etc.  She last took her Eliquis  was the AM of 2/5.  In the emergency department the patient had blood work work which revealed a hemoglobin of 6.8 grams.  She was afebrile.  Her rectal exam revealed no stool at all.  Her rectal mucosa was hemoccult negative.  White blood cell count 6.0K.  Platelet count 260K.  Her  creatinine was 1.6.  Her last creatinine about 6 months ago was 1.4.  When seated the patient has no symptoms at all.  She mainly feels the extra shortness of breath with ambulation.  She received 2 units of PRBCs.  Repeat labs this morning show hemoglobin up to 8.8 g.   Past Medical History:  Diagnosis Date   Anemia    several times over the years (10/17/2012)   Anxiety    Breast cancer (HCC) 02/2010   right breast   CAD (coronary artery disease)    a. LHC 12/2018: mild non-obstructive CAD   Carcinoma of breast treated with adjuvant hormone therapy (HCC) 02/2010   Femara    Chronic systolic CHF    a. Echo in 2015: LVEF of 35-40%, b. 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 SN: [id number];  Surgeon: Fleeta Ochoa, Maude, MD;  Location: [facility] OR;  Service: Open Heart Surgery;  Laterality: N/A;   APPENDECTOMY  1974   BREAST BIOPSY Right 02/27/10   Needle core Biopsy; Invasive Mammary; ER/PR Positive, Her-2 Neu negative, Ki-67 22%   BREAST LUMPECTOMY WITH SENTINEL LYMPH NODE BIOPSY Right 07/03/11   Invasive Ductal Carcinoma;0/3 nodes negative,; ER,PR Positive, Her-2 Neg; Ki-67 22%   CESAREAN SECTION  1970; 1974   COLONOSCOPY     DILATION AND CURETTAGE OF UTERUS  1970's   RIGHT HEART CATH AND  CORONARY ANGIOGRAPHY N/A 08/31/2023   Procedure: RIGHT HEART CATH AND CORONARY ANGIOGRAPHY;  Surgeon: Wonda Sharper, MD;  Location: [facility] INVASIVE CV LAB;  Service: Cardiovascular;  Laterality: N/A;   RIGHT/LEFT HEART CATH AND CORONARY ANGIOGRAPHY N/A 01/02/2019   Procedure: RIGHT/LEFT HEART CATH AND CORONARY ANGIOGRAPHY;  Surgeon: Rolan Ezra RAMAN, MD;  Location: [facility] INVASIVE CV LAB;  Service: Cardiovascular;  Laterality: N/A;   TEE WITHOUT CARDIOVERSION N/A 01/31/2019   Procedure: TRANSESOPHAGEAL ECHOCARDIOGRAM (TEE);  Surgeon: Fleeta Ochoa, Maude, MD;  Location: [facility] OR;  Service: Open Heart Surgery;  Laterality: N/A;   TUBAL LIGATION  1974   WISDOM TOOTH EXTRACTION      Prior to Admission medications  Medication Sig Start Date End Date Taking? Authorizing Provider  ferrous sulfate  325 (65 FE) MG tablet Take 325 mg by mouth every other day.   Yes [provider]  OXYGEN  Inhale 5 L/min into the lungs continuous.   Yes [provider]  albuterol  (PROVENTIL ) (2.5 MG/3ML) 0.083% nebulizer solution USE 1 VIAL VIA NEBULIZER EVERY 6 HOURS AS NEEDED FOR WHEEZING OR SHORTNESS OF BREATH 02/01/24   Jude Harden GAILS, MD  albuterol  (VENTOLIN  HFA) 108 (90 Base) MCG/ACT inhaler INHALE 2 PUFFS INTO THE LUNGS EVERY 4-6 HOURS AS NEEDED FOR WHEEZING OR SHORTNESS OF BREATH 05/23/24   Cobb, Comer GAILS, NP  amiodarone  (PACERONE ) 200 MG tablet Take 1 tablet (200 mg total) by mouth daily. 12/29/23   Court Dorn PARAS, MD  atorvastatin  (LIPITOR) 20 MG tablet Take 2 tablets (40 mg total) by mouth daily. 04/19/24   Goodrich, Callie E, PA-C  budesonide -glycopyrrolate -formoterol  (BREZTRI  AEROSPHERE) 160-9-4.8 MCG/ACT AERO inhaler INHALE 2 PUFFS INTO THE LUNGS TWICE DAILY 03/07/24   Wert, Michael B, MD  carvedilol  (COREG ) 12.5 MG tablet Take 12.5 mg by mouth 2 (two) times daily with a meal. 11/29/23   [provider]  Cholecalciferol  (VITAMIN D3) 25 MCG (1000 UT) CAPS Take 1,000 Units by mouth daily. 02/10/23    [provider]  doxycycline  (VIBRAMYCIN ) 100 MG capsule Take 100 mg by mouth 2 (two) times daily. 07/24/24 07/29/24  [provider]  ELIQUIS  2.5 MG TABS tablet TAKE 1 TABLET BY MOUTH TWICE A DAY 04/12/24   Court Dorn PARAS, MD  Ensifentrine  (OHTUVAYRE ) 3 MG/2.5ML SUSP Inhale 2.5 mLs into the lungs 2 (two) times daily. 02/10/24   Cobb, Comer GAILS, NP  ezetimibe  (ZETIA ) 10 MG tablet Take 10 mg by mouth daily.    [provider]  FEROSUL 325 (65 Fe) MG tablet Take 325 mg by mouth every other day. 05/08/23   [provider]  fluticasone  (FLONASE ) 50 MCG/ACT nasal spray SHAKE LIQUID AND USE 2 SPRAYS  IN EACH NOSTRIL DAILY 11/29/23   Jude Harden GAILS, MD  furosemide  (LASIX ) 20 MG tablet Take 20 mg by mouth daily. 12/02/23   [provider]  guaiFENesin -dextromethorphan  (ROBITUSSIN DM) 100-10 MG/5ML syrup Take 5 mLs by mouth every 4 (four) hours as needed for cough. 09/16/23   Christobal Guadalajara, MD  ipratropium (ATROVENT ) 0.06 % nasal spray Place 2 sprays into both nostrils 4 (four) times daily. 08/16/23   Cobb, Comer GAILS, NP  losartan  (COZAAR ) 25 MG tablet Take 25 mg by mouth daily.    [provider]  pantoprazole  (PROTONIX ) 40 MG tablet Take 40 mg by mouth daily. 11/25/23   [provider]  potassium chloride  (KLOR-CON  M) 10 MEQ tablet Take 10 mEq by mouth daily. 12/02/23   [provider]  predniSONE  (DELTASONE ) 20 MG tablet Take 20 mg by mouth 2 (two) times daily with a meal. 07/24/24 07/29/24  [provider]  sertraline  (ZOLOFT ) 100 MG tablet Take 200 mg by mouth daily.    [provider]    Current Facility-Administered Medications  Medication Dose Route Frequency Provider Last Rate Last Admin   0.9 %  sodium chloride  infusion (Manually program via Guardrails IV Fluids)   Intravenous Once Trifan, Donnice PARAS, MD       0.9 %  sodium chloride  infusion (Manually program via Guardrails IV Fluids)   Intravenous Once Arthea Child, MD       acetaminophen  (TYLENOL ) tablet 650 mg  650 mg Oral Q6H PRN Arthea Child, MD       Or   acetaminophen  (TYLENOL ) suppository 650 mg  650 mg Rectal Q6H PRN Arthea Child, MD       albuterol  (PROVENTIL ) (2.5 MG/3ML) 0.083% nebulizer solution 2.5 mg  2.5 mg Nebulization Q4H PRN Arthea Child, MD       amiodarone  (PACERONE ) tablet 200 mg  200 mg Oral Daily Claiborne, Claudia, MD       budesonide -glycopyrrolate -formoterol  (BREZTRI ) 160-9-4.8 MCG/ACT inhaler 2 puff  2 puff Inhalation BID Claiborne, Claudia, MD       carvedilol  (COREG ) tablet 12.5 mg  12.5 mg Oral BID WC Arthea Child, MD       Ensifentrine  SUSP 2.5 mL  2.5 mL Inhalation BID Claiborne, Claudia, MD       furosemide  (LASIX ) tablet 20 mg  20 mg Oral Daily Claiborne, Claudia, MD       guaiFENesin -dextromethorphan  (ROBITUSSIN DM) 100-10 MG/5ML syrup 5 mL  5 mL Oral Q4H PRN Arthea Child, MD       Influenza vac split trivalent PF (FLUZONE  HIGH-DOSE) injection 0.5 mL  0.5 mL Intramuscular Tomorrow-1000 Chavez, Abigail, NP       melatonin tablet 5 mg  5 mg Oral QHS PRN Chavez, Abigail, NP   5 mg at 07/28/24 0028   ondansetron  (ZOFRAN ) tablet 4 mg  4 mg Oral Q6H PRN Arthea Child, MD       Or   ondansetron  (ZOFRAN ) injection 4 mg  4 mg Intravenous Q6H PRN Arthea Child, MD       senna-docusate (Senokot-S) tablet 1 tablet  1 tablet Oral QHS PRN Arthea Child, MD       sodium chloride  flush (NS) 0.9 % injection 3 mL  3 mL Intravenous Q12H Arthea Child, MD   3 mL at 07/27/24 2300   Facility-Administered Medications Ordered in Other Encounters  Medication Dose Route Frequency Provider Last Rate Last Admin   sodium chloride  flush (NS) 0.9 % injection 3 mL  3 mL Intravenous  Q12H Rolan Ezra RAMAN, MD        Allergies as of 07/27/2024   (No Known Allergies)    Family History  Problem Relation Age of Onset   Heart disease Mother    Cancer Father    Heart disease Maternal  Grandmother    Heart disease Maternal Grandfather    Cancer Paternal Grandmother    Lung disease Neg Hx    Colon cancer Neg Hx    Esophageal cancer Neg Hx    Inflammatory bowel disease Neg Hx    Liver disease Neg Hx    Pancreatic cancer Neg Hx    Rectal cancer Neg Hx    Stomach cancer Neg Hx     Social History   Socioeconomic History   Marital status: Married    Spouse name: Not on file   Number of children: 2   Years of education: college   Highest education level: Bachelor's degree (e.g., BA, AB, BS)  Occupational History    Employer: RETIRED  Tobacco Use   Smoking status: Former    Current packs/day: 0.00    Average packs/day: 3.0 packs/day for 32.1 years (96.3 ttl pk-yrs)    Types: Cigarettes    Start date: 05/19/1958    Quit date: 06/30/1990    Years since quitting: 34.1   Smokeless tobacco: Never   Tobacco comments:    Stopped smoking for 2-3 years during second child.   Vaping Use   Vaping status: Never Used  Substance and Sexual Activity   Alcohol use: Not Currently   Drug use: No   Sexual activity: Yes    Birth control/protection: Post-menopausal    Comment: Menarche age 23, G47,P2, Menopause in 58s, No HRT Married  Other Topics Concern   Not on file  Social History Narrative   [facility] (12/29/16):   Originally from [place], [place]. Moved to [place] at 84 y.o. Does have a dog. Hasn't worked outside the home. Remote parakeet exposure from her children. No mold or hot tub exposure. Enjoys doing yard work & walking.    Social Drivers of Health   Tobacco Use: Medium Risk (07/27/2024)   Patient History    Smoking Tobacco Use: Former    Smokeless Tobacco Use: Never    Passive Exposure: Not on Actuary Strain: Not on file  Food Insecurity: No Food Insecurity (07/27/2024)   Epic    Worried About Programme Researcher, Broadcasting/film/video in the Last Year: Never true    Ran Out of Food in the Last Year: Never true  Transportation Needs: No Transportation Needs  (07/27/2024)   Epic    Lack of Transportation (Medical): No    Lack of Transportation (Non-Medical): No  Physical Activity: Not on file  Stress: Not on file  Social Connections: Moderately Isolated (07/27/2024)   Social Connection and Isolation Panel    Frequency of Communication with Friends and Family: More than three times a week    Frequency of Social Gatherings with Friends and Family: More than three times a week    Attends Religious Services: Never    Database Administrator or Organizations: No    Attends Banker Meetings: Never    Marital Status: Married  Catering Manager Violence: Not At Risk (07/27/2024)   Epic    Fear of Current or Ex-Partner: No    Emotionally Abused: No    Physically Abused: No    Sexually Abused: No  Depression (PHQ2-9): Medium Risk (07/26/2023)  Depression (PHQ2-9)    PHQ-2 Score: 10  Alcohol Screen: Not on file  Housing: Low Risk (07/27/2024)   Epic    Unable to Pay for Housing in the Last Year: No    Number of Times Moved in the Last Year: 0    Homeless in the Last Year: No  Utilities: Not At Risk (07/27/2024)   Epic    Threatened with loss of utilities: No  Health Literacy: Not on file    Review of Systems: ROS is O/W negative except as mentioned in HPI.  Physical Exam: Vital signs in last 24 hours: Temp:  [97.7 F (36.5 C)-98.5 F (36.9 C)] 98 F (36.7 C) (02/06 0543) Pulse Rate:  [59-65] 59 (02/06 0543) Resp:  [15-23] 17 (02/06 0543) BP: (113-150)/(63-93) 130/71 (02/06 0543) SpO2:  [95 %-100 %] 96 % (02/06 0543) Weight:  [53.1 kg] 53.1 kg (02/05 2014) Last BM Date : 07/27/24 General:  Alert, Well-developed, well-nourished, pleasant and cooperative in NAD Head:  Normocephalic and atraumatic. Eyes:  Sclera clear, no icterus.  Conjunctiva pink. Ears:  Normal auditory acuity. Mouth:  No deformity or lesions.   Lungs:  Clear throughout to auscultation, but decreased air movement. Heart:  Regular rate and rhythm; no murmurs,  clicks, rubs, or gallops. Abdomen:  Soft, non-distended.  BS present.  Non-tender. Msk:  Symmetrical without gross deformities. Pulses:  Normal pulses noted. Extremities:  Without clubbing or edema. Neurologic:  Alert and oriented x 4;  grossly normal neurologically. Skin:  Intact without significant lesions or rashes. Psych:  Alert and cooperative. Normal mood and affect.  Intake/Output from previous day: 02/05 0701 - 02/06 0700 In: 653.8 [Blood:653.8] Out: -   Lab Results: Recent Labs    07/27/24 1519  WBC 6.0  HGB 6.8*  HCT 23.0*  PLT 260   BMET Recent Labs    07/27/24 1519  NA 139  K 4.5  CL 102  CO2 25  GLUCOSE 130*  BUN 24*  CREATININE 1.60*  CALCIUM  9.9   Studies/Results: DG Chest 2 View Result Date: 07/27/2024 CLINICAL DATA:  Shortness of breath. EXAM: CHEST - 2 VIEW COMPARISON:  December 27, 2023 FINDINGS: Multiple sternal wires are noted. The heart size and mediastinal contours are within normal limits. There is marked severity calcification of the aortic arch. An artificial aortic valve is noted. There is a small right pleural effusion. No acute infiltrate or pneumothorax is identified. Multilevel degenerative changes are present throughout the thoracic spine. IMPRESSION: 1. Evidence of prior median sternotomy and aortic valve replacement. 2. Small right pleural effusion. Electronically Signed   By: Suzen Dials M.D.   On: 07/27/2024 16:23   IMPRESSION:  Anemia, acute on chronic with baseline between 8 and 9 g.  Hemoglobin is down to 6.8 g here and she was transfused with 2 units of packed red blood cells.  Repeat hemoglobin this morning shows hemoglobin up to 8.8 g.  She is heme-negative (no stool, just rectal mucus).  No sign of overt bleeding, definitely no bright red blood, but has had dark/black stools intermittently over the past 6 months (none in over a week or more).  No NSAID use or other GI symptoms.  COPD on 5 Liters of O2  Atrial fibrillation on  Eliquis  with last dose on 2/5 AM  PLAN: -Monitor Hgb and transfuse further prn. -Will restart her home pantoprazole  40 mg PO daily for mucosal coverage for now. -? Possible EGD to start.  Will let Dr. Avram discuss with  her.  I have placed her on clear liquids for now.   Dana Pruitt  07/28/2024, 8:39 AM    [facility] Attending   I have taken an interval history, reviewed the chart and examined the patient. I agree with the Advanced Practitioner's note, impression and recommendations as written.  The majority of the medical decision making was performed by me.  Here are my additions to the note:  She has had an acute decrease in hemoglobin of unclear etiology.  She has intermittent black stools which could be due to iron but could be melena.  She is at increased risk of bleeding due to Eliquis .  Since she had a PET scan in August that was negative (workup of cystic pancreas lesion ? IPMN vs cystic neoplasm - she declined EUS or f/u MR) I very much doubt she has a gastrointestinal malignancy.  Eliquis  increases the risk of bleeding is mention but she has a strong indication for that given A-fib and history of stroke more than once.  She is at highert risk of sedation problems given her COPD and oxygen  requirements but we discussed observation versus upper GI endoscopy and have decided to pursue an upper GI endoscopy to look for potential bleeding sources.  The risks and benefits as well as alternatives of endoscopic procedure(s) have been discussed and reviewed. All questions answered. The patient agrees to proceed.  Dana CHARLENA Commander, MD, [facility] See TRACEY on call - gastroenterology for best contact person 07/28/2024 10:41 AM             "

## 2024-07-28 NOTE — TOC Initial Note (Signed)
 Transition of Care [facility]) - Initial/Assessment Note    Patient Details  Name: Dana Pruitt MRN: [id number] Date of Birth: [birth year]  Transition [facility] Care [facility] CM/SW Contact:    Heather DELENA Saltness, LCSW Phone Number: 07/28/2024, 9:14 AM  Clinical Narrative:                 Pt admitted from home due to shortness of breath and abnormal labs. Pt resides in townhouse in [place]. Pt currently on 4-5L of O2 at home, provided through [employer]. Per chart review, pt was active with [facility] services in September 2025. TOC will continue to follow.    Expected Discharge Plan: Home/Self Care Barriers to Discharge: Continued Medical Work up   Patient Goals and CMS Choice Patient states their goals for this hospitalization and ongoing recovery are:: To return home CMS Medicare.gov Compare Post Acute Care list provided to:: Patient Choice offered to / list presented to : Patient [place] ownership interest in [facility].provided to:: Patient    Expected Discharge Plan and Services In-house Referral: Clinical Social Work Discharge Planning Services: NA Post Acute Care Choice: NA Living arrangements for the past 2 months: Apartment                 DME Arranged: N/A DME Agency: NA       HH Arranged: NA HH Agency: NA        Prior Living Arrangements/Services Living arrangements for the past 2 months: Apartment Lives with:: Self Patient language and need for interpreter reviewed:: Yes Do you feel safe going back to the place where you live?: Yes      Need for Family Participation in Patient Care: Yes (Comment) Care giver support system in place?: Yes (comment) Current home services: DME Criminal Activity/Legal Involvement Pertinent to Current Situation/Hospitalization: No - Comment as needed  Activities of Daily Living   ADL Screening (condition at time of admission) Independently performs ADLs?: Yes (appropriate for developmental age) Is the patient deaf or  have difficulty hearing?: No Does the patient have difficulty seeing, even when wearing glasses/contacts?: Yes Does the patient have difficulty concentrating, remembering, or making decisions?: No  Permission Sought/Granted Permission sought to share information with : Family Supports Permission granted to share information with : Yes, Verbal Permission Granted  Share Information with NAME: [name] Bach  Permission granted to share info w AGENCY: [employer]  Permission granted to share info w Relationship: Friend  Permission granted to share info w Contact Information: [phone]  Emotional Assessment Appearance:: Appears stated age Attitude/Demeanor/Rapport: Unable to Assess Affect (typically observed): Unable to Assess Orientation: : Oriented to Self, Oriented to Place, Oriented to  Time, Oriented to Situation Alcohol / Substance Use: Not Applicable Psych Involvement: No (comment)  Admission diagnosis:  DOE (dyspnea on exertion) [R06.09] GI bleed [K92.2] COPD with acute exacerbation (HCC) [J44.1] COPD mixed type (HCC) [J44.9] Symptomatic anemia [D64.9] Patient Active Problem List   Diagnosis Date Noted   GI bleed 07/27/2024   HFrEF (heart failure with reduced ejection fraction) (HCC) 07/27/2024   History of primary malignant neoplasm of breast 07/27/2024   Oxygen  dependent 07/27/2024   Pancreatic cyst 04/18/2024   Pancreatic lesion 04/18/2024   Abnormal MRI of abdomen 04/18/2024   Pancreatic mass 02/14/2024   Anemia of chronic disease 12/27/2023   Anxiety 12/27/2023   Stage 3a chronic kidney disease (HCC) 12/27/2023   CAD (coronary artery disease) 12/27/2023   Acute CVA (cerebrovascular accident) (HCC) 12/27/2023   Protein-calorie malnutrition,  severe 10/22/2023   Chest pain 08/30/2023   Solitary pulmonary nodule on lung CT 01/05/2022   Allergic rhinitis 12/20/2021   Thoracic aortic aneurysm 08/26/2021   Neck muscle spasm 08/30/2019   S/P AVR (aortic valve replacement)  04/26/2019   Essential hypertension 04/19/2019   Chronic respiratory failure with hypoxia (HCC) 04/19/2019   PAF (paroxysmal atrial fibrillation) (HCC) 02/09/2019   Pulmonary hypertension (HCC)    Chronic systolic CHF (congestive heart failure) (HCC) 12/16/2018   Severe aortic regurgitation 09/05/2014   HLD (hyperlipidemia) 03/28/2014   Hollenhorst plaque, left eye 02/15/2014   Osteopenia 11/16/2013   COPD  GOLD II 11/22/2012   Depression 10/17/2012   Primary cancer of upper outer quadrant of right female breast (HCC) 02/27/2010   PCP:  Clarice Nottingham, MD Pharmacy:   [id number] - [place], [place] - [address] AT [facility] OF [address] [place] [address] Phone: [phone] Fax: [phone]  [facility] [address] [place] [address] Phone: [phone] Fax: [phone]  [id number] [place], [place] - [address] AT [facility] [address] [place] [address] Phone: [phone] Fax: [phone]   Social Drivers of Health (SDOH) Social History: SDOH Screenings   Food Insecurity: No Food Insecurity (07/27/2024)  Housing: Low Risk (07/27/2024)  Transportation Needs: No Transportation Needs (07/27/2024)  Utilities: Not At Risk (07/27/2024)  Depression (PHQ2-9): Medium Risk (07/26/2023)  Social Connections: Moderately Isolated (07/27/2024)  Tobacco Use: Medium Risk (07/27/2024)   SDOH Interventions: None     Readmission Risk Interventions    07/28/2024    9:10 AM 10/22/2023    2:19 PM 09/14/2023    1:01 PM  Readmission Risk Prevention Plan  Transportation Screening Complete Complete Complete  PCP or Specialist Appt within 3-5 Days Complete Complete   HRI or Home Care Consult Complete Complete Complete  Social Work Consult for Recovery Care Planning/Counseling Complete Complete Complete  Palliative Care Screening Not Applicable  Complete Not Applicable  Medication Review Oceanographer) Complete Complete Referral to Pharmacy    Signed: Heather Saltness, MSW, LCSW Clinical Social Worker Inpatient Care Management 07/28/2024 9:48 AM

## 2024-07-28 NOTE — Hospital Course (Addendum)
 Dana Pruitt is a 84 y.o. female with medical history significant for paroxysmal atrial fibrillation on Eliquis  and amiodarone , COPD with 5 L O2 nasal cannula at all times, history of stroke, hypertension and hyperlipidemia who presents because her primary doctor called her and told her to come to the ED due to a Hgb of 5.9.  The patient has been feeling extremely weak and much more short of breath than baseline for the last 3 days. The patient has not seen any bright red blood but she has noticed intermittent black stools over the last 6 months.  GI was consulted and she was FOBT Negative but they are pursing an Upper Endoscopy on 07/29/24.   Assessment and Plan:  Symptomatic Anemia, likely due to blood loss: Continue to Hold Apixaban . Hgb/Hct Trend:  Recent Labs  Lab 07/27/24 1519 07/28/24 0815 07/28/24 1036  HGB 6.8* 8.8* 8.3*  HCT 23.0* 27.1* 26.3*  MCV 98.7 93.8 93.3  -S/p Transfusion of 2 units of pRBCs. [facility] consulted and plan is for Upper Endoscopy on 07/29/24 to look for potential bleeding sources; Will need to remain NPO in am -Hold Ferrous Sulfate  325 mg po EOD -Further care per GI   PAF w/ Hx of RVR: Continue to Hold Apixaban . C/w Amiodarone  200 mg po Daily and Carvedilol  12.5 mg po BID. CTM on Telemetry   Hx of AVR: Done by Dr. Fleeta Ochoa. Holding Anticoagulation as above  H/o CVA: C/w Atorvastatin  20 mg po Daily. Continue to Hold Apixaban  as above. CTM on Telemetry    Essential HTN: C/w Carvedilol  12.5 mg po BID and Furosemide  20 mg po Daily. CTM BP per Protocol. Last BP reading was 143/77   HLD: C/w Ezetimibe  10 mg po Daily and Atorvastatin  20 mg po Daily   Chronic Systolic CHF: Appears Euvolemic. Cath 08/2023 showed no sig. CAD. EF=40% per echo 12/2023. C/w Furosemide  20 mg po daily and Carvedilol  12.5 mg po BID. CTM For S/Sx of Volume Overload  COPD w/ Chronic Respiratory Failure on 5 Liters: C/w Breztri  2 puff IH BID and Ensifentrine  2.5 mL IH BID; C/w Albuterol  2.5  mg Neb q4hprn Wheezing. C/w Guaifenesin  1200 mg po BID  Depression and Anxiety: C/w Sertraline  150 mg po Daily   AKI on CKD Stage 3a: Baseline Cr ~ 0.9-1.2. BUN/Cr Trend went from 24/1.60 -> 22/1.44 -> 22/1.36. Avoid Nephrotoxic Medications, Contrast Dyes, Hypotension and Dehydration to Ensure Adequate Renal Perfusion and will need to Renally Adjust Meds; CTM & Trend Renal Function carefully and repeat CMP in the AM   Hx of Breast Cancer: noted.   GERD/GI Prophylaxis: C/w PPI w/ Pantoprazole  40 mg po Daily

## 2024-07-29 ENCOUNTER — Encounter (HOSPITAL_COMMUNITY): Admission: EM | Payer: Self-pay | Source: Home / Self Care | Attending: Internal Medicine

## 2024-07-29 ENCOUNTER — Encounter (HOSPITAL_COMMUNITY): Payer: Self-pay | Admitting: Anesthesiology

## 2024-08-21 ENCOUNTER — Ambulatory Visit: Admitting: Hematology and Oncology

## 2024-08-21 ENCOUNTER — Ambulatory Visit: Admitting: Student

## 2024-08-22 ENCOUNTER — Inpatient Hospital Stay: Admitting: Hematology and Oncology

## 2024-09-21 ENCOUNTER — Other Ambulatory Visit (HOSPITAL_COMMUNITY)
